# Patient Record
Sex: Male | Born: 1975 | Race: Black or African American | Hispanic: No | Marital: Single | Smoking: Former smoker
Health system: Southern US, Community
[De-identification: ages and names within clinical notes are randomized; demographics above are authoritative.]

## PROBLEM LIST (undated history)

## (undated) DIAGNOSIS — I10 Essential (primary) hypertension: Secondary | ICD-10-CM

## (undated) DIAGNOSIS — Z21 Asymptomatic human immunodeficiency virus [HIV] infection status: Secondary | ICD-10-CM

## (undated) DIAGNOSIS — F329 Major depressive disorder, single episode, unspecified: Secondary | ICD-10-CM

## (undated) DIAGNOSIS — M545 Low back pain, unspecified: Secondary | ICD-10-CM

## (undated) DIAGNOSIS — M1990 Unspecified osteoarthritis, unspecified site: Secondary | ICD-10-CM

## (undated) DIAGNOSIS — B20 Human immunodeficiency virus [HIV] disease: Secondary | ICD-10-CM

## (undated) DIAGNOSIS — A630 Anogenital (venereal) warts: Secondary | ICD-10-CM

## (undated) DIAGNOSIS — E663 Overweight: Secondary | ICD-10-CM

## (undated) DIAGNOSIS — F32A Depression, unspecified: Secondary | ICD-10-CM

## (undated) DIAGNOSIS — F419 Anxiety disorder, unspecified: Secondary | ICD-10-CM

## (undated) DIAGNOSIS — M797 Fibromyalgia: Secondary | ICD-10-CM

## (undated) DIAGNOSIS — G8929 Other chronic pain: Secondary | ICD-10-CM

## (undated) DIAGNOSIS — M722 Plantar fascial fibromatosis: Secondary | ICD-10-CM

## (undated) DIAGNOSIS — N289 Disorder of kidney and ureter, unspecified: Secondary | ICD-10-CM

## (undated) DIAGNOSIS — B181 Chronic viral hepatitis B without delta-agent: Secondary | ICD-10-CM

## (undated) DIAGNOSIS — K589 Irritable bowel syndrome without diarrhea: Secondary | ICD-10-CM

## (undated) DIAGNOSIS — E119 Type 2 diabetes mellitus without complications: Secondary | ICD-10-CM

## (undated) DIAGNOSIS — G43909 Migraine, unspecified, not intractable, without status migrainosus: Secondary | ICD-10-CM

## (undated) HISTORY — DX: Essential (primary) hypertension: I10

## (undated) HISTORY — DX: Overweight: E66.3

## (undated) HISTORY — DX: Anxiety disorder, unspecified: F41.9

## (undated) HISTORY — DX: Plantar fascial fibromatosis: M72.2

## (undated) HISTORY — DX: Type 2 diabetes mellitus without complications: E11.9

## (undated) HISTORY — DX: Human immunodeficiency virus (HIV) disease: B20

## (undated) HISTORY — DX: Depression, unspecified: F32.A

## (undated) HISTORY — DX: Asymptomatic human immunodeficiency virus (hiv) infection status: Z21

## (undated) HISTORY — DX: Major depressive disorder, single episode, unspecified: F32.9

## (undated) HISTORY — PX: WISDOM TOOTH EXTRACTION: SHX21

## (undated) HISTORY — PX: FOOT SURGERY: SHX648

## (undated) HISTORY — DX: Unspecified osteoarthritis, unspecified site: M19.90

---

## 2001-02-06 ENCOUNTER — Emergency Department (HOSPITAL_COMMUNITY): Admission: EM | Admit: 2001-02-06 | Discharge: 2001-02-07 | Payer: Self-pay | Admitting: Emergency Medicine

## 2001-05-07 ENCOUNTER — Emergency Department (HOSPITAL_COMMUNITY): Admission: EM | Admit: 2001-05-07 | Discharge: 2001-05-07 | Payer: Self-pay | Admitting: *Deleted

## 2001-05-07 ENCOUNTER — Encounter: Payer: Self-pay | Admitting: *Deleted

## 2001-06-12 ENCOUNTER — Emergency Department (HOSPITAL_COMMUNITY): Admission: EM | Admit: 2001-06-12 | Discharge: 2001-06-12 | Payer: Self-pay | Admitting: Emergency Medicine

## 2001-08-09 ENCOUNTER — Emergency Department (HOSPITAL_COMMUNITY): Admission: EM | Admit: 2001-08-09 | Discharge: 2001-08-09 | Payer: Self-pay | Admitting: Emergency Medicine

## 2001-09-05 ENCOUNTER — Emergency Department (HOSPITAL_COMMUNITY): Admission: EM | Admit: 2001-09-05 | Discharge: 2001-09-05 | Payer: Self-pay | Admitting: Emergency Medicine

## 2001-10-31 ENCOUNTER — Emergency Department (HOSPITAL_COMMUNITY): Admission: EM | Admit: 2001-10-31 | Discharge: 2001-10-31 | Payer: Self-pay | Admitting: Emergency Medicine

## 2001-12-14 ENCOUNTER — Emergency Department (HOSPITAL_COMMUNITY): Admission: EM | Admit: 2001-12-14 | Discharge: 2001-12-14 | Payer: Self-pay | Admitting: Emergency Medicine

## 2001-12-14 ENCOUNTER — Encounter: Payer: Self-pay | Admitting: Emergency Medicine

## 2002-03-13 ENCOUNTER — Emergency Department (HOSPITAL_COMMUNITY): Admission: EM | Admit: 2002-03-13 | Discharge: 2002-03-13 | Payer: Self-pay | Admitting: Podiatry

## 2002-03-27 ENCOUNTER — Emergency Department (HOSPITAL_COMMUNITY): Admission: EM | Admit: 2002-03-27 | Discharge: 2002-03-28 | Payer: Self-pay | Admitting: Emergency Medicine

## 2002-04-08 ENCOUNTER — Emergency Department (HOSPITAL_COMMUNITY): Admission: EM | Admit: 2002-04-08 | Discharge: 2002-04-08 | Payer: Self-pay | Admitting: Emergency Medicine

## 2002-04-29 ENCOUNTER — Emergency Department (HOSPITAL_COMMUNITY): Admission: EM | Admit: 2002-04-29 | Discharge: 2002-04-29 | Payer: Self-pay | Admitting: *Deleted

## 2002-05-22 ENCOUNTER — Emergency Department (HOSPITAL_COMMUNITY): Admission: EM | Admit: 2002-05-22 | Discharge: 2002-05-23 | Payer: Self-pay | Admitting: Emergency Medicine

## 2005-05-09 ENCOUNTER — Emergency Department (HOSPITAL_COMMUNITY): Admission: EM | Admit: 2005-05-09 | Discharge: 2005-05-09 | Payer: Self-pay | Admitting: Emergency Medicine

## 2005-08-23 ENCOUNTER — Emergency Department (HOSPITAL_COMMUNITY): Admission: EM | Admit: 2005-08-23 | Discharge: 2005-08-23 | Payer: Self-pay | Admitting: Emergency Medicine

## 2005-12-25 ENCOUNTER — Emergency Department (HOSPITAL_COMMUNITY): Admission: EM | Admit: 2005-12-25 | Discharge: 2005-12-25 | Payer: Self-pay | Admitting: Emergency Medicine

## 2009-02-19 ENCOUNTER — Emergency Department (HOSPITAL_COMMUNITY): Admission: EM | Admit: 2009-02-19 | Discharge: 2009-02-19 | Payer: Self-pay | Admitting: Family Medicine

## 2009-04-16 ENCOUNTER — Emergency Department (HOSPITAL_COMMUNITY): Admission: EM | Admit: 2009-04-16 | Discharge: 2009-04-16 | Payer: Self-pay | Admitting: Emergency Medicine

## 2009-04-20 ENCOUNTER — Emergency Department (HOSPITAL_COMMUNITY): Admission: EM | Admit: 2009-04-20 | Discharge: 2009-04-20 | Payer: Self-pay | Admitting: Emergency Medicine

## 2009-10-14 ENCOUNTER — Emergency Department (HOSPITAL_COMMUNITY): Admission: EM | Admit: 2009-10-14 | Discharge: 2009-10-14 | Payer: Self-pay | Admitting: Emergency Medicine

## 2009-10-25 ENCOUNTER — Emergency Department (HOSPITAL_COMMUNITY): Admission: EM | Admit: 2009-10-25 | Discharge: 2009-10-25 | Payer: Self-pay | Admitting: Emergency Medicine

## 2009-12-08 ENCOUNTER — Emergency Department (HOSPITAL_COMMUNITY): Admission: EM | Admit: 2009-12-08 | Discharge: 2009-12-08 | Payer: Self-pay | Admitting: Emergency Medicine

## 2010-04-11 ENCOUNTER — Emergency Department (HOSPITAL_COMMUNITY): Admission: EM | Admit: 2010-04-11 | Discharge: 2010-04-11 | Payer: Self-pay | Admitting: Emergency Medicine

## 2010-05-13 ENCOUNTER — Emergency Department (HOSPITAL_COMMUNITY): Admission: EM | Admit: 2010-05-13 | Discharge: 2010-05-13 | Payer: Self-pay | Admitting: Emergency Medicine

## 2010-05-31 ENCOUNTER — Emergency Department (HOSPITAL_COMMUNITY): Admission: EM | Admit: 2010-05-31 | Discharge: 2010-05-31 | Payer: Self-pay | Admitting: Emergency Medicine

## 2010-06-07 ENCOUNTER — Emergency Department (HOSPITAL_COMMUNITY)
Admission: EM | Admit: 2010-06-07 | Discharge: 2010-06-07 | Payer: Self-pay | Source: Home / Self Care | Admitting: Emergency Medicine

## 2010-09-12 LAB — POCT I-STAT, CHEM 8
Calcium, Ion: 1.1 mmol/L — ABNORMAL LOW (ref 1.12–1.32)
HCT: 42 % (ref 39.0–52.0)
Hemoglobin: 14.3 g/dL (ref 13.0–17.0)
Sodium: 140 mEq/L (ref 135–145)
TCO2: 25 mmol/L (ref 0–100)

## 2010-09-12 LAB — RAPID STREP SCREEN (MED CTR MEBANE ONLY): Streptococcus, Group A Screen (Direct): NEGATIVE

## 2010-09-19 LAB — CBC
HCT: 34 % — ABNORMAL LOW (ref 39.0–52.0)
Platelets: 212 10*3/uL (ref 150–400)
RDW: 15.1 % (ref 11.5–15.5)
WBC: 7.8 10*3/uL (ref 4.0–10.5)

## 2010-09-19 LAB — DIFFERENTIAL
Basophils Absolute: 0 10*3/uL (ref 0.0–0.1)
Eosinophils Relative: 4 % (ref 0–5)
Lymphocytes Relative: 40 % (ref 12–46)
Neutrophils Relative %: 45 % (ref 43–77)

## 2010-09-19 LAB — MONONUCLEOSIS SCREEN: Mono Screen: NEGATIVE

## 2010-09-19 LAB — RAPID STREP SCREEN (MED CTR MEBANE ONLY): Streptococcus, Group A Screen (Direct): NEGATIVE

## 2010-10-05 LAB — CBC
HCT: 34.7 % — ABNORMAL LOW (ref 39.0–52.0)
Platelets: 188 10*3/uL (ref 150–400)
RBC: 4.13 MIL/uL — ABNORMAL LOW (ref 4.22–5.81)
WBC: 10.7 10*3/uL — ABNORMAL HIGH (ref 4.0–10.5)

## 2010-10-05 LAB — BASIC METABOLIC PANEL
BUN: 7 mg/dL (ref 6–23)
Creatinine, Ser: 1.11 mg/dL (ref 0.4–1.5)
GFR calc Af Amer: >60 mL/min (ref >60)
GFR calc non Af Amer: >60 mL/min (ref >60)
Potassium: 3.2 mEq/L — ABNORMAL LOW (ref 3.5–5.1)

## 2010-10-05 LAB — DIFFERENTIAL
Basophils Relative: 0 % (ref 0–1)
Eosinophils Absolute: 0 10*3/uL (ref 0.0–0.7)
Neutrophils Relative %: 73 % (ref 43–77)

## 2010-10-05 LAB — CK TOTAL AND CKMB (NOT AT ARMC)
Relative Index: 0.5 (ref 0.0–2.5)
Total CK: 280 U/L — ABNORMAL HIGH (ref 7–232)

## 2010-10-05 LAB — TROPONIN I: Troponin I: 0.01 ng/mL (ref 0.00–0.06)

## 2010-10-05 LAB — RAPID STREP SCREEN (MED CTR MEBANE ONLY): Streptococcus, Group A Screen (Direct): NEGATIVE

## 2010-10-07 LAB — DIFFERENTIAL
Basophils Absolute: 0 10*3/uL (ref 0.0–0.1)
Basophils Relative: 0 % (ref 0–1)
Eosinophils Absolute: 0.1 K/uL (ref 0.0–0.7)
Eosinophils Relative: 1 % (ref 0–5)
Lymphocytes Relative: 30 % (ref 12–46)
Lymphs Abs: 2.8 K/uL (ref 0.7–4.0)
Monocytes Absolute: 1 K/uL (ref 0.1–1.0)
Monocytes Relative: 11 % (ref 3–12)
Neutro Abs: 5.4 10*3/uL (ref 1.7–7.7)
Neutrophils Relative %: 58 % (ref 43–77)

## 2010-10-07 LAB — CBC
HCT: 35.3 % — ABNORMAL LOW (ref 39.0–52.0)
Hemoglobin: 12.1 g/dL — ABNORMAL LOW (ref 13.0–17.0)
MCHC: 34.3 g/dL (ref 30.0–36.0)
MCV: 84.1 fL (ref 78.0–100.0)
Platelets: 168 K/uL (ref 150–400)
RBC: 4.2 MIL/uL — ABNORMAL LOW (ref 4.22–5.81)
RDW: 13.8 % (ref 11.5–15.5)
WBC: 9.2 K/uL (ref 4.0–10.5)

## 2010-10-07 LAB — COMPREHENSIVE METABOLIC PANEL WITH GFR
ALT: 13 U/L (ref 0–53)
AST: 30 U/L (ref 0–37)
Albumin: 3.2 g/dL — ABNORMAL LOW (ref 3.5–5.2)
CO2: 30 meq/L (ref 19–32)
Calcium: 9.2 mg/dL (ref 8.4–10.5)
Chloride: 102 meq/L (ref 96–112)
Creatinine, Ser: 1.11 mg/dL (ref 0.4–1.5)
GFR calc Af Amer: >60 mL/min (ref >60)
GFR calc non Af Amer: >60 mL/min (ref >60)
Sodium: 137 meq/L (ref 135–145)
Total Bilirubin: 0.4 mg/dL (ref 0.3–1.2)

## 2010-10-07 LAB — URINALYSIS, ROUTINE W REFLEX MICROSCOPIC
Bilirubin Urine: NEGATIVE
Glucose, UA: NEGATIVE mg/dL
Ketones, ur: NEGATIVE mg/dL
Leukocytes, UA: NEGATIVE
Nitrite: NEGATIVE
Protein, ur: NEGATIVE mg/dL
Specific Gravity, Urine: 1.007 (ref 1.005–1.030)
Urobilinogen, UA: 0.2 mg/dL (ref 0.0–1.0)
pH: 6 (ref 5.0–8.0)

## 2010-10-07 LAB — LIPASE, BLOOD: Lipase: 31 U/L (ref 11–59)

## 2010-10-07 LAB — URINE MICROSCOPIC-ADD ON

## 2010-10-07 LAB — COMPREHENSIVE METABOLIC PANEL
Alkaline Phosphatase: 57 U/L (ref 39–117)
BUN: 8 mg/dL (ref 6–23)
Glucose, Bld: 88 mg/dL (ref 70–99)
Potassium: 3.9 mEq/L (ref 3.5–5.1)
Total Protein: 9.4 g/dL — ABNORMAL HIGH (ref 6.0–8.3)

## 2010-11-07 ENCOUNTER — Emergency Department (HOSPITAL_COMMUNITY)
Admission: EM | Admit: 2010-11-07 | Discharge: 2010-11-08 | Disposition: A | Discharge status: Home / Self Care | Payer: Self-pay | Attending: Emergency Medicine | Admitting: Emergency Medicine

## 2010-11-07 DIAGNOSIS — F410 Panic disorder [episodic paroxysmal anxiety] without agoraphobia: Secondary | ICD-10-CM | POA: Insufficient documentation

## 2010-11-07 DIAGNOSIS — R51 Headache: Secondary | ICD-10-CM | POA: Insufficient documentation

## 2010-11-10 ENCOUNTER — Emergency Department (HOSPITAL_COMMUNITY)
Admission: EM | Admit: 2010-11-10 | Discharge: 2010-11-10 | Disposition: A | Discharge status: Home / Self Care | Payer: Self-pay | Attending: Emergency Medicine | Admitting: Emergency Medicine

## 2010-11-10 DIAGNOSIS — M25559 Pain in unspecified hip: Secondary | ICD-10-CM | POA: Insufficient documentation

## 2010-12-02 ENCOUNTER — Emergency Department (HOSPITAL_COMMUNITY)
Admission: EM | Admit: 2010-12-02 | Discharge: 2010-12-02 | Disposition: A | Discharge status: Home / Self Care | Payer: Self-pay | Attending: Emergency Medicine | Admitting: Emergency Medicine

## 2010-12-02 ENCOUNTER — Emergency Department (HOSPITAL_COMMUNITY): Payer: Self-pay

## 2010-12-02 DIAGNOSIS — M545 Low back pain, unspecified: Secondary | ICD-10-CM | POA: Insufficient documentation

## 2010-12-02 DIAGNOSIS — IMO0002 Reserved for concepts with insufficient information to code with codable children: Secondary | ICD-10-CM | POA: Insufficient documentation

## 2010-12-02 DIAGNOSIS — M25559 Pain in unspecified hip: Secondary | ICD-10-CM | POA: Insufficient documentation

## 2010-12-02 DIAGNOSIS — X58XXXA Exposure to other specified factors, initial encounter: Secondary | ICD-10-CM | POA: Insufficient documentation

## 2011-01-30 ENCOUNTER — Ambulatory Visit (INDEPENDENT_AMBULATORY_CARE_PROVIDER_SITE_OTHER): Payer: Self-pay

## 2011-01-30 DIAGNOSIS — B20 Human immunodeficiency virus [HIV] disease: Secondary | ICD-10-CM

## 2011-01-30 DIAGNOSIS — Z79899 Other long term (current) drug therapy: Secondary | ICD-10-CM

## 2011-01-30 DIAGNOSIS — Z113 Encounter for screening for infections with a predominantly sexual mode of transmission: Secondary | ICD-10-CM

## 2011-01-30 DIAGNOSIS — Z23 Encounter for immunization: Secondary | ICD-10-CM

## 2011-01-30 LAB — CBC WITH DIFFERENTIAL/PLATELET
Basophils Absolute: 0 10*3/uL (ref 0.0–0.1)
Basophils Relative: 1 % (ref 0–1)
Eosinophils Absolute: 0 10*3/uL (ref 0.0–0.7)
Eosinophils Relative: 1 % (ref 0–5)
HCT: 33.5 % — ABNORMAL LOW (ref 39.0–52.0)
Hemoglobin: 10.9 g/dL — ABNORMAL LOW (ref 13.0–17.0)
Lymphocytes Relative: 46 % (ref 12–46)
Lymphs Abs: 3 10*3/uL (ref 0.7–4.0)
MCH: 27 pg (ref 26.0–34.0)
MCHC: 32.5 g/dL (ref 30.0–36.0)
MCV: 82.9 fL (ref 78.0–100.0)
Monocytes Absolute: 1 10*3/uL (ref 0.1–1.0)
Monocytes Relative: 15 % — ABNORMAL HIGH (ref 3–12)
Neutro Abs: 2.5 10*3/uL (ref 1.7–7.7)
Neutrophils Relative %: 38 % — ABNORMAL LOW (ref 43–77)
Platelets: 181 10*3/uL (ref 150–400)
RBC: 4.04 MIL/uL — ABNORMAL LOW (ref 4.22–5.81)
RDW: 13.6 % (ref 11.5–15.5)
WBC: 6.6 10*3/uL (ref 4.0–10.5)

## 2011-01-31 LAB — HEPATITIS C ANTIBODY: HCV Ab: NEGATIVE

## 2011-01-31 LAB — HEPATITIS B SURFACE ANTIGEN: Hepatitis B Surface Ag: POSITIVE — AB

## 2011-01-31 LAB — LIPID PANEL
HDL: 34 mg/dL — ABNORMAL LOW (ref >39)
LDL Cholesterol: 72 mg/dL (ref 0–99)
Total CHOL/HDL Ratio: 3.5 Ratio
Triglycerides: 65 mg/dL (ref <150)

## 2011-01-31 LAB — HIV-1 RNA ULTRAQUANT REFLEX TO GENTYP+
HIV 1 RNA Quant: 99000 copies/mL — ABNORMAL HIGH (ref <20)
HIV-1 RNA Quant, Log: 5 {Log} — ABNORMAL HIGH (ref <1.30)

## 2011-01-31 LAB — COMPLETE METABOLIC PANEL WITH GFR
BUN: 18 mg/dL (ref 6–23)
CO2: 24 mEq/L (ref 19–32)
Creat: 1.27 mg/dL (ref 0.50–1.35)
GFR, Est African American: >60 mL/min (ref >60)
GFR, Est Non African American: >60 mL/min (ref >60)
Glucose, Bld: 82 mg/dL (ref 70–99)
Total Bilirubin: 0.4 mg/dL (ref 0.3–1.2)

## 2011-01-31 LAB — RPR

## 2011-01-31 LAB — HEPATITIS B CORE ANTIBODY, IGM: Hep B C IgM: NEGATIVE

## 2011-01-31 LAB — URINALYSIS
Glucose, UA: NEGATIVE mg/dL
Hgb urine dipstick: NEGATIVE
Ketones, ur: NEGATIVE mg/dL
Leukocytes, UA: NEGATIVE
Nitrite: NEGATIVE
Protein, ur: 30 mg/dL — AB

## 2011-02-01 ENCOUNTER — Encounter (INDEPENDENT_AMBULATORY_CARE_PROVIDER_SITE_OTHER): Payer: PRIVATE HEALTH INSURANCE

## 2011-02-01 DIAGNOSIS — B20 Human immunodeficiency virus [HIV] disease: Secondary | ICD-10-CM | POA: Diagnosis not present

## 2011-02-02 DIAGNOSIS — J302 Other seasonal allergic rhinitis: Secondary | ICD-10-CM | POA: Insufficient documentation

## 2011-02-02 DIAGNOSIS — F419 Anxiety disorder, unspecified: Secondary | ICD-10-CM | POA: Insufficient documentation

## 2011-02-07 LAB — HIV-1 GENOTYPR PLUS

## 2011-02-15 ENCOUNTER — Telehealth: Payer: Self-pay | Admitting: *Deleted

## 2011-02-15 ENCOUNTER — Ambulatory Visit: Payer: Self-pay

## 2011-02-15 ENCOUNTER — Ambulatory Visit (INDEPENDENT_AMBULATORY_CARE_PROVIDER_SITE_OTHER): Payer: Self-pay | Admitting: Internal Medicine

## 2011-02-15 ENCOUNTER — Encounter: Payer: Self-pay | Admitting: Internal Medicine

## 2011-02-15 VITALS — BP 155/109 | HR 85 | Temp 97.5°F | Ht 72.0 in | Wt 162.0 lb

## 2011-02-15 DIAGNOSIS — B20 Human immunodeficiency virus [HIV] disease: Secondary | ICD-10-CM

## 2011-02-15 DIAGNOSIS — B1910 Unspecified viral hepatitis B without hepatic coma: Secondary | ICD-10-CM

## 2011-02-15 DIAGNOSIS — I10 Essential (primary) hypertension: Secondary | ICD-10-CM

## 2011-02-15 DIAGNOSIS — Z23 Encounter for immunization: Secondary | ICD-10-CM

## 2011-02-15 MED ORDER — CLONIDINE HCL 0.1 MG PO TABS
0.1000 mg | ORAL_TABLET | Freq: Once | ORAL | Status: AC
  Administered 2011-02-15: 0.1 mg via ORAL

## 2011-02-15 NOTE — Telephone Encounter (Signed)
Call patient and let him know that Dr. Luciana Axe decided on a different regimen than Atripla.  Call after ADAP goes through. Wendall Mola CMA

## 2011-02-15 NOTE — Assessment & Plan Note (Signed)
His bp was elevated and he reports it has been elevated in the past.  He did respond to clonidine.  I discussed smoking cessation and he will try to stop all together.  He did not want a patch.  If he continues to smoke, he likely will need antihypertensive therapy.   Will recheck next visit.

## 2011-02-15 NOTE — Assessment & Plan Note (Signed)
I will check his DNA and hep B e ab, ag today.  He will start on therapy with Truvada once on adap.  He does have elevated AST likely as a result of the hep b.  He will also need AFP next visit.

## 2011-02-15 NOTE — Assessment & Plan Note (Signed)
After discussion with him regarding treatment, he does appear ready to start a daily regimen.  I am hesitant though to give him Atripla with a history of anxiety and his work schedule and his living situation, since he was recently in a shelter.  Therefore, I am going to opt for something with a higher threshold of reistance that also will treat his hepatitis b.  I will start him on boosted Prezista and Truvada all once daily.  I discussed with him the need to use condoms with all sexual activity.  I will repeat his vl and CD4 one month after he starts his meds, once he gets them through ADAP.

## 2011-02-15 NOTE — Progress Notes (Signed)
  Subjective:    Patient ID: Phillip Gill, male    DOB: 1975-08-20, 35 y.o.   MRN: [id number]  HPI here as a new patient.  Diagnosed in 2005 with risk being MSM.  He knew of the diagnsosis at that time and saw a health care provider who it appears did not start him on meds due to his CD4 count being good.  He reports he has never been on meds in the past.  He denies any other STIs.  He recently moved here and lives with an ex partner. He denies any depression or history of mental illness, though he has anxiety listed on his problems.  He works third shift.  No recent hospitalizations.  He does report some fatigue.  He did not know about his hepatitis B status.      Review of Systems  All other systems reviewed and are negative.       Objective:   Physical Exam  Constitutional: He is oriented to person, place, and time. He appears well-developed and well-nourished.  Cardiovascular: Normal rate, regular rhythm and normal heart sounds.   No murmur heard. Pulmonary/Chest: Effort normal and breath sounds normal. No respiratory distress.  Abdominal: Soft. Bowel sounds are normal. He exhibits no distension.  Lymphadenopathy:    He has no cervical adenopathy.  Neurological: He is alert and oriented to person, place, and time.  Skin: Skin is warm and dry. No erythema.  Psychiatric:       Moderately flat affect          Assessment & Plan:

## 2011-02-16 ENCOUNTER — Other Ambulatory Visit: Payer: Self-pay | Admitting: *Deleted

## 2011-02-16 DIAGNOSIS — B20 Human immunodeficiency virus [HIV] disease: Secondary | ICD-10-CM

## 2011-02-16 MED ORDER — RITONAVIR 100 MG PO TABS: 100.0000 mg | ORAL_TABLET | Freq: Every day | ORAL | Status: DC

## 2011-02-16 MED ORDER — EMTRICITABINE-TENOFOVIR DF 200-300 MG PO TABS: 1.0000 | ORAL_TABLET | Freq: Every day | ORAL | Status: DC

## 2011-02-16 MED ORDER — DARUNAVIR ETHANOLATE 400 MG PO TABS: 800.0000 mg | ORAL_TABLET | Freq: Every day | ORAL | Status: DC

## 2011-02-22 LAB — HEPATITIS B DNA, QUALITATIVE: Hepatitis B Virus DNA Qual: DETECTED — AB

## 2011-03-02 ENCOUNTER — Telehealth: Payer: Self-pay | Admitting: *Deleted

## 2011-03-02 NOTE — Telephone Encounter (Signed)
Phillip Gill from [facility] Department requesting LFT lab results faxed.  Faxed labs to [phone]. Wendall Mola CMA

## 2011-03-22 ENCOUNTER — Encounter: Payer: Self-pay | Admitting: Internal Medicine

## 2011-03-22 ENCOUNTER — Ambulatory Visit (INDEPENDENT_AMBULATORY_CARE_PROVIDER_SITE_OTHER): Payer: Self-pay | Admitting: Internal Medicine

## 2011-03-22 VITALS — BP 128/86 | HR 67 | Temp 98.6°F | Wt 171.4 lb

## 2011-03-22 DIAGNOSIS — B20 Human immunodeficiency virus [HIV] disease: Secondary | ICD-10-CM

## 2011-03-22 DIAGNOSIS — B1910 Unspecified viral hepatitis B without hepatic coma: Secondary | ICD-10-CM

## 2011-03-22 DIAGNOSIS — I10 Essential (primary) hypertension: Secondary | ICD-10-CM

## 2011-03-22 NOTE — Assessment & Plan Note (Signed)
Today his hepatitis labs were reviewed. He does have detectable DNA virus and is hepatitis Be antigen positive and is on therapy with Truvada. He will have an AFP checked and his surface antigen checked after one year. I discussed the need to avoid alcohol and other liver consulting agents.

## 2011-03-22 NOTE — Progress Notes (Signed)
  Subjective:    Patient ID: Phillip Gill, male    DOB: Oct 20, 1975, 35 y.o.   MRN: [id number]  HPI this patient comes in for followup after starting his new antiretroviral regimen. He was started on boost and Prezista and Truvada approximately 3 weeks ago and he reports excellent compliance taking all of his doses. His only complaint today is a small papillary rash on his upper arms and chest. He tells me that nonpruritic and otherwise does not bother him. It is not erythematous. He otherwise reports some constipation but no complaints of diarrhea, shortness of breath, weight loss. He also has a history of hepatitis B surface antigen positive and BE antigen positive. His hepatitis be a virus it is detectable but was not quantified.    Review of Systems  Constitutional: Negative.   HENT: Negative.   Eyes: Negative.   Respiratory: Negative.   Cardiovascular: Negative.   Gastrointestinal: Negative.   Genitourinary: Negative.   Musculoskeletal: Negative.   Skin: Negative.        + nonpuritic rash  Neurological: Negative.   Hematological: Negative.   Psychiatric/Behavioral: Negative.        Objective:   Physical Exam  Constitutional: He is oriented to person, place, and time. He appears well-developed and well-nourished.  Cardiovascular: Normal rate, regular rhythm and normal heart sounds.   No murmur heard. Pulmonary/Chest: Effort normal and breath sounds normal. He has no wheezes.  Abdominal: Soft. Bowel sounds are normal. There is no tenderness.  Lymphadenopathy:    He has no cervical adenopathy.  Neurological: He is alert and oriented to person, place, and time.  Skin:       + multiple pappilary lesions on upper arms and torso, non-erythematous, no pruritis, no hives.  Psychiatric: He has a normal mood and affect. His behavior is normal.          Assessment & Plan:

## 2011-03-22 NOTE — Assessment & Plan Note (Signed)
Today his blood pressure is in better control and this will be watched.

## 2011-03-22 NOTE — Assessment & Plan Note (Signed)
Today he will have his CD4 T-cell count and viral load checked since he has been on his therapy for approximately 3 weeks.if his viral load is improving he will return in approximately 6 weeks for repeat labs and a appointment in 2 months. I do not feel his rash is consistent with a drug rash since he does not have the typical characteristics of a drug rash. I did tell him though if it worsens or changes to return to clinic. I encouraged continued compliance and the need for condom use. I discussed the long-term effects of HIV and antiretroviral therapy.

## 2011-03-26 ENCOUNTER — Other Ambulatory Visit: Payer: Self-pay | Admitting: *Deleted

## 2011-03-26 DIAGNOSIS — B20 Human immunodeficiency virus [HIV] disease: Secondary | ICD-10-CM

## 2011-03-26 LAB — HIV-1 RNA QUANT-NO REFLEX-BLD
HIV 1 RNA Quant: 1140 copies/mL — ABNORMAL HIGH (ref <20)
HIV-1 RNA Quant, Log: 3.06 {Log} — ABNORMAL HIGH (ref <1.30)

## 2011-03-26 MED ORDER — EMTRICITABINE-TENOFOVIR DF 200-300 MG PO TABS: 1.0000 | ORAL_TABLET | Freq: Every day | ORAL | Status: DC

## 2011-03-26 MED ORDER — RITONAVIR 100 MG PO TABS: 100.0000 mg | ORAL_TABLET | Freq: Every day | ORAL | Status: DC

## 2011-03-26 MED ORDER — DARUNAVIR ETHANOLATE 400 MG PO TABS
800.0000 mg | ORAL_TABLET | Freq: Every day | ORAL | Status: DC
Start: 2011-03-26 — End: 2011-07-19

## 2011-03-26 NOTE — Telephone Encounter (Deleted)
Duplicate. Jennet Maduro, RN

## 2011-03-27 ENCOUNTER — Telehealth: Payer: Self-pay | Admitting: *Deleted

## 2011-03-27 NOTE — Telephone Encounter (Signed)
He wanted to know lab results which were drawn at [facility]. Gave him the numbers & discussed. States he is taking his meds each day & has not missed any

## 2011-04-26 ENCOUNTER — Ambulatory Visit: Payer: Self-pay

## 2011-05-08 ENCOUNTER — Other Ambulatory Visit: Payer: Self-pay | Admitting: Internal Medicine

## 2011-05-08 ENCOUNTER — Other Ambulatory Visit (INDEPENDENT_AMBULATORY_CARE_PROVIDER_SITE_OTHER): Payer: Self-pay

## 2011-05-08 DIAGNOSIS — B20 Human immunodeficiency virus [HIV] disease: Secondary | ICD-10-CM

## 2011-05-08 DIAGNOSIS — B1910 Unspecified viral hepatitis B without hepatic coma: Secondary | ICD-10-CM

## 2011-05-09 LAB — CBC WITH DIFFERENTIAL/PLATELET
Basophils Absolute: 0 10*3/uL (ref 0.0–0.1)
Basophils Relative: 0 % (ref 0–1)
Eosinophils Absolute: 0.1 10*3/uL (ref 0.0–0.7)
Eosinophils Relative: 1 % (ref 0–5)
MCH: 28.7 pg (ref 26.0–34.0)
MCHC: 32.2 g/dL (ref 30.0–36.0)
MCV: 89.3 fL (ref 78.0–100.0)
Neutrophils Relative %: 34 % — ABNORMAL LOW (ref 43–77)
Platelets: 178 10*3/uL (ref 150–400)
RBC: 4.28 MIL/uL (ref 4.22–5.81)
RDW: 15.8 % — ABNORMAL HIGH (ref 11.5–15.5)

## 2011-05-09 LAB — COMPLETE METABOLIC PANEL WITH GFR
ALT: 13 U/L (ref 0–53)
AST: 28 U/L (ref 0–37)
Albumin: 3.8 g/dL (ref 3.5–5.2)
CO2: 30 mEq/L (ref 19–32)
Calcium: 9 mg/dL (ref 8.4–10.5)
Chloride: 104 mEq/L (ref 96–112)
Creat: 1.14 mg/dL (ref 0.50–1.35)
GFR, Est African American: >89 mL/min (ref >89)
Potassium: 4.4 mEq/L (ref 3.5–5.3)
Total Protein: 8.8 g/dL — ABNORMAL HIGH (ref 6.0–8.3)

## 2011-05-09 LAB — AFP TUMOR MARKER: AFP-Tumor Marker: <1.3 ng/mL (ref 0.0–8.0)

## 2011-05-10 LAB — HIV-1 RNA QUANT-NO REFLEX-BLD: HIV-1 RNA Quant, Log: 2.21 {Log} — ABNORMAL HIGH (ref <1.30)

## 2011-05-22 ENCOUNTER — Ambulatory Visit: Payer: Self-pay | Admitting: Internal Medicine

## 2011-05-22 ENCOUNTER — Ambulatory Visit (INDEPENDENT_AMBULATORY_CARE_PROVIDER_SITE_OTHER): Payer: Self-pay | Admitting: Internal Medicine

## 2011-05-22 ENCOUNTER — Encounter: Payer: Self-pay | Admitting: Internal Medicine

## 2011-05-22 VITALS — BP 150/98 | HR 84 | Temp 98.0°F | Wt 176.0 lb

## 2011-05-22 DIAGNOSIS — M5430 Sciatica, unspecified side: Secondary | ICD-10-CM

## 2011-05-22 DIAGNOSIS — B20 Human immunodeficiency virus [HIV] disease: Secondary | ICD-10-CM

## 2011-05-22 DIAGNOSIS — F411 Generalized anxiety disorder: Secondary | ICD-10-CM

## 2011-05-22 DIAGNOSIS — F419 Anxiety disorder, unspecified: Secondary | ICD-10-CM

## 2011-05-22 DIAGNOSIS — Z23 Encounter for immunization: Secondary | ICD-10-CM

## 2011-05-22 DIAGNOSIS — I10 Essential (primary) hypertension: Secondary | ICD-10-CM

## 2011-05-22 DIAGNOSIS — B1910 Unspecified viral hepatitis B without hepatic coma: Secondary | ICD-10-CM

## 2011-05-22 LAB — URINALYSIS
Ketones, ur: NEGATIVE mg/dL
Nitrite: NEGATIVE
Specific Gravity, Urine: 1.014 (ref 1.005–1.030)
Urobilinogen, UA: 0.2 mg/dL (ref 0.0–1.0)

## 2011-05-22 NOTE — Assessment & Plan Note (Signed)
The patient continues to have good tolerance of the medications in his CD4 count is rising appropriately and his viral load is near suppression. He continues to have good tolerance to medications and will continue. He will followup in 4 months. He was reminded that he needs to use condoms with all sexual activity.

## 2011-05-22 NOTE — Patient Instructions (Signed)
Follow up with new PCP.

## 2011-05-22 NOTE — Assessment & Plan Note (Signed)
His blood pressure again today is elevated. I did discuss potential need for antihypertensive therapy. Per his request she will be referred to the primary care physician to evaluate his non-HIV issues.

## 2011-05-22 NOTE — Assessment & Plan Note (Signed)
He did describe recent panic attack and he was given information for mental health provider to followup with.

## 2011-05-22 NOTE — Assessment & Plan Note (Signed)
He does have symptoms that appear consistent with sciatica. At this time and will be followed clinically however it was he was offered to be referred to physical therapy if the sciatica does worsen or persist without any relief.

## 2011-05-22 NOTE — Assessment & Plan Note (Signed)
The patient is also co-infected with hepatitis B. He does take the Truvada as part of his regimen which will treat his hepatitis B. He will have regular alpha-fetoprotein levels and we'll check his hepatitis B viral load in approximately a years time.

## 2011-05-22 NOTE — Progress Notes (Signed)
  Subjective:    Patient ID: Phillip Gill, male    DOB: 05-18-1976, 35 y.o.   MRN: [id number]  HPIthis patient comes in for routine followup. He was recently started on regimen of boosted Prezista and Truvada. He has taken it faithfully and has not missed any doses. He does have multiple complaints though with the regimen he takes it well and feels like it is working. He does have a complaint of what sounds like dysuria and frequency that's been going on for the last several weeks. He does get up at night though he states it has gotten better recently. He also has some fatigue which has been going on since he started his medications for. He also has a complaint of some lesions that he feels on his anal region. He also has some nerve pain on his left lumbar region that sometimes radiates down his left leg and period. He also does describe a recent panic attack that occurred which is the first one is had in many years according to him.    Review of Systems  Constitutional: Positive for fatigue. Negative for fever, activity change, appetite change and unexpected weight change.  HENT: Negative for trouble swallowing.   Respiratory: Negative for cough and shortness of breath.   Cardiovascular: Negative for leg swelling.  Gastrointestinal: Negative for nausea, diarrhea, constipation and rectal pain.  Genitourinary: Positive for dysuria and frequency. Negative for discharge and testicular pain.  Musculoskeletal: Negative for myalgias and arthralgias.  Skin: Negative for pallor and rash.  Neurological: Negative for headaches.  Hematological: Negative for adenopathy.  Psychiatric/Behavioral: The patient is not nervous/anxious.        Objective:   Physical Exam  Constitutional: He is oriented to person, place, and time. He appears well-developed and well-nourished. No distress.  HENT:  Mouth/Throat: Oropharynx is clear and moist. No oropharyngeal exudate.  Eyes: No scleral icterus.    Cardiovascular: Normal rate, regular rhythm and normal heart sounds.  Exam reveals no gallop and no friction rub.   No murmur heard. Pulmonary/Chest: Effort normal and breath sounds normal. No respiratory distress. He has no wheezes. He has no rales.  Abdominal: Soft. Bowel sounds are normal. He exhibits no distension. There is no tenderness. There is no rebound.  Genitourinary: Penis normal. No penile tenderness.       Testicular size normal, no pain  Lymphadenopathy:    He has no cervical adenopathy.  Neurological: He is alert and oriented to person, place, and time.  Skin: Skin is warm and dry. No erythema.  Psychiatric: He has a normal mood and affect.          Assessment & Plan:

## 2011-05-23 LAB — GC/CHLAMYDIA PROBE AMP, URINE
Chlamydia, Swab/Urine, PCR: NEGATIVE
GC Probe Amp, Urine: NEGATIVE

## 2011-07-13 ENCOUNTER — Emergency Department (HOSPITAL_COMMUNITY): Payer: Self-pay

## 2011-07-13 ENCOUNTER — Other Ambulatory Visit: Payer: Self-pay

## 2011-07-13 ENCOUNTER — Encounter (HOSPITAL_COMMUNITY): Payer: Self-pay | Admitting: *Deleted

## 2011-07-13 ENCOUNTER — Emergency Department (HOSPITAL_COMMUNITY)
Admission: EM | Admit: 2011-07-13 | Discharge: 2011-07-13 | Disposition: A | Discharge status: Home / Self Care | Payer: Self-pay | Attending: Emergency Medicine | Admitting: Emergency Medicine

## 2011-07-13 DIAGNOSIS — M545 Low back pain, unspecified: Secondary | ICD-10-CM | POA: Insufficient documentation

## 2011-07-13 DIAGNOSIS — R079 Chest pain, unspecified: Secondary | ICD-10-CM | POA: Insufficient documentation

## 2011-07-13 DIAGNOSIS — R209 Unspecified disturbances of skin sensation: Secondary | ICD-10-CM | POA: Insufficient documentation

## 2011-07-13 DIAGNOSIS — Z21 Asymptomatic human immunodeficiency virus [HIV] infection status: Secondary | ICD-10-CM | POA: Insufficient documentation

## 2011-07-13 DIAGNOSIS — R202 Paresthesia of skin: Secondary | ICD-10-CM

## 2011-07-13 DIAGNOSIS — M79609 Pain in unspecified limb: Secondary | ICD-10-CM | POA: Insufficient documentation

## 2011-07-13 DIAGNOSIS — Z79899 Other long term (current) drug therapy: Secondary | ICD-10-CM | POA: Insufficient documentation

## 2011-07-13 DIAGNOSIS — I10 Essential (primary) hypertension: Secondary | ICD-10-CM | POA: Insufficient documentation

## 2011-07-13 LAB — CBC
HCT: 38.7 % — ABNORMAL LOW (ref 39.0–52.0)
MCHC: 33.6 g/dL (ref 30.0–36.0)
MCV: 89 fL (ref 78.0–100.0)
RDW: 13.5 % (ref 11.5–15.5)

## 2011-07-13 LAB — BASIC METABOLIC PANEL
BUN: 17 mg/dL (ref 6–23)
Calcium: 9.2 mg/dL (ref 8.4–10.5)
Creatinine, Ser: 1.15 mg/dL (ref 0.50–1.35)
GFR calc Af Amer: >90 mL/min (ref >90)
GFR calc non Af Amer: 81 mL/min — ABNORMAL LOW (ref >90)

## 2011-07-13 LAB — URINALYSIS, ROUTINE W REFLEX MICROSCOPIC
Bilirubin Urine: NEGATIVE
Ketones, ur: NEGATIVE mg/dL
Nitrite: NEGATIVE
Urobilinogen, UA: 0.2 mg/dL (ref 0.0–1.0)
pH: 5 (ref 5.0–8.0)

## 2011-07-13 LAB — CARDIAC PANEL(CRET KIN+CKTOT+MB+TROPI): Troponin I: <0.3 ng/mL (ref <0.30)

## 2011-07-13 MED ORDER — HYDROCODONE-ACETAMINOPHEN 5-325 MG PO TABS: 1.0000 | ORAL_TABLET | ORAL | Status: AC | PRN

## 2011-07-13 MED ORDER — CYCLOBENZAPRINE HCL 10 MG PO TABS
5.0000 mg | ORAL_TABLET | Freq: Once | ORAL | Status: AC
  Administered 2011-07-13: 5 mg via ORAL
  Filled 2011-07-13: qty 1

## 2011-07-13 MED ORDER — CYCLOBENZAPRINE HCL 10 MG PO TABS: 10.0000 mg | ORAL_TABLET | Freq: Two times a day (BID) | ORAL | Status: DC | PRN

## 2011-07-13 MED ORDER — NAPROXEN 500 MG PO TABS: 500.0000 mg | ORAL_TABLET | Freq: Two times a day (BID) | ORAL | Status: DC

## 2011-07-13 MED ORDER — NAPROXEN 500 MG PO TABS
500.0000 mg | ORAL_TABLET | ORAL | Status: AC
  Administered 2011-07-13: 500 mg via ORAL
  Filled 2011-07-13: qty 1

## 2011-07-13 NOTE — ED Provider Notes (Addendum)
History     CSN: [id number]  Arrival date & time 07/13/11  1356   First MD Initiated Contact with Patient 07/13/11 1548      Chief Complaint  Patient presents with  . Tingling   patient presents to the ED for evaluation of tingling to the body for 3-4 days.  He states it initially began when he was moving a pallet jack 3 days ago, where he works at [employer]. The numbness has been moving from different parts of the body from the left leg to the left arm. He's had no headache or neck pain. No focal, motor weakness. No bowel or bladder incontinence. He has had some mild pain in his right low back and also some mild pain in his right chest which has been transient and nonexertional. No shortness of breath. No fevers. He does follow with Dr. Verdie Drown at the infectious disease clinic and states his viral load is very low. His HIV is under control.  (Consider location/radiation/quality/duration/timing/severity/associated sxs/prior treatment) HPI  Past Medical History  Diagnosis Date  . HIV infection   . Hypertension     History reviewed. No pertinent past surgical history.  Family History  Problem Relation Age of Onset  . Mental illness Neg Hx     History  Substance Use Topics  . Smoking status: Current Everyday Smoker -- 0.5 packs/day for 15 years    Types: Cigarettes  . Smokeless tobacco: Never Used  . Alcohol Use: Yes     every 2 weeks hard liquor      Review of Systems  All other systems reviewed and are negative.    Allergies  Bactrim and Zoloft  Home Medications   Current Outpatient Rx  Name Route Sig Dispense Refill  . DARUNAVIR ETHANOLATE 400 MG PO TABS Oral Take 2 tablets (800 mg total) by mouth daily. 60 tablet 11  . EMTRICITABINE-TENOFOVIR 200-300 MG PO TABS Oral Take 1 tablet by mouth daily. 30 tablet 11  . RITONAVIR 100 MG PO TABS Oral Take 1 tablet (100 mg total) by mouth daily. 30 tablet 11    BP 146/69  Pulse 79  Temp(Src) 98.2 F (36.8 C) (Oral)   Resp 16  SpO2 100%  Physical Exam  Nursing note and vitals reviewed. Constitutional: He is oriented to person, place, and time. He appears well-developed and well-nourished.  HENT:  Head: Normocephalic and atraumatic.  Eyes: Conjunctivae and EOM are normal. Pupils are equal, round, and reactive to light.  Neck: Neck supple.  Cardiovascular: Normal rate and regular rhythm.  Exam reveals no gallop and no friction rub.   No murmur heard. Pulmonary/Chest: Breath sounds normal. He has no wheezes. He has no rales. He exhibits no tenderness.  Abdominal: Soft. Bowel sounds are normal. He exhibits no distension. There is no tenderness. There is no rebound and no guarding.  Musculoskeletal: Normal range of motion. He exhibits no edema.       Minimal tenderness, right lower paralumbar musculature, no spinal tenderness. There is some tenderness with left lower leg extension and left leg raise. There is no calf tenderness or swelling.  Neurological: He is alert and oriented to person, place, and time. No cranial nerve deficit. Coordination normal.       Motor strength is equal and symmetric.  Skin: Skin is warm and dry. No rash noted.  Psychiatric: He has a normal mood and affect.    ED Course  Procedures (including critical care time)  Labs Reviewed - No data to  display No results found.   No diagnosis found.    MDM  Pt is seen and examined;  Initial history and physical completed.  Will follow.        Results for orders placed during the hospital encounter of 07/13/11  CBC      Component Value Range   WBC 9.4  4.0 - 10.5 (K/uL)   RBC 4.35  4.22 - 5.81 (MIL/uL)   Hemoglobin 13.0  13.0 - 17.0 (g/dL)   HCT 16.1 (*) 09.6 - 52.0 (%)   MCV 89.0  78.0 - 100.0 (fL)   MCH 29.9  26.0 - 34.0 (pg)   MCHC 33.6  30.0 - 36.0 (g/dL)   RDW 04.5  40.9 - 81.1 (%)   Platelets 174  150 - 400 (K/uL)   Dg Chest 2 View  07/13/2011  *RADIOLOGY REPORT*  Clinical Data: Right chest pain.  Weakness.   CHEST - 2 VIEW  Comparison: 06/07/2010  Findings: Heart and mediastinal contours are within normal limits. No focal opacities or effusions.  No acute bony abnormality. Improving interstitial opacities previously noted.  IMPRESSION: No active cardiopulmonary disease.  Original Report Authenticated By: Cyndie Chime, M.D.      Raad Clayson A. Patrica Duel, MD 07/13/11 1644   Patient to the CDU pending reassessment. Lab and imaging evaluation and will likely need bone consult from the infectious disease doctor. Disposition is pending  Theron Arista A. Patrica Duel, MD 07/13/11 1950

## 2011-07-13 NOTE — ED Provider Notes (Signed)
Discussed patient with Dr. Ninetta Lights (ID)--ok to discharge home with symptomatic treatment--will follow-up in clinic next week.  Jimmye Norman, NP 07/13/11 2133

## 2011-07-13 NOTE — ED Notes (Signed)
To ed for eval of tingling to different areas og body for the past 3-4 days.

## 2011-07-14 NOTE — ED Provider Notes (Signed)
Medical screening examination/treatment/procedure(s) were conducted as a shared visit with non-physician practitioner(s) and myself.  I personally evaluated the patient during the encounter   Phillip Gill A. Wilma Michaelson, MD 07/14/11 1459 

## 2011-07-16 ENCOUNTER — Telehealth: Payer: Self-pay | Admitting: *Deleted

## 2011-07-16 ENCOUNTER — Telehealth: Payer: Self-pay | Admitting: Internal Medicine

## 2011-07-16 ENCOUNTER — Encounter: Payer: Self-pay | Admitting: Infectious Diseases

## 2011-07-16 ENCOUNTER — Ambulatory Visit (INDEPENDENT_AMBULATORY_CARE_PROVIDER_SITE_OTHER): Payer: Self-pay | Admitting: Infectious Diseases

## 2011-07-16 ENCOUNTER — Other Ambulatory Visit: Payer: Self-pay | Admitting: *Deleted

## 2011-07-16 DIAGNOSIS — M5430 Sciatica, unspecified side: Secondary | ICD-10-CM

## 2011-07-16 DIAGNOSIS — Z79899 Other long term (current) drug therapy: Secondary | ICD-10-CM

## 2011-07-16 DIAGNOSIS — Z113 Encounter for screening for infections with a predominantly sexual mode of transmission: Secondary | ICD-10-CM

## 2011-07-16 DIAGNOSIS — B20 Human immunodeficiency virus [HIV] disease: Secondary | ICD-10-CM

## 2011-07-16 DIAGNOSIS — K5900 Constipation, unspecified: Secondary | ICD-10-CM

## 2011-07-16 DIAGNOSIS — K5909 Other constipation: Secondary | ICD-10-CM | POA: Insufficient documentation

## 2011-07-16 LAB — COMPREHENSIVE METABOLIC PANEL
AST: 30 U/L (ref 0–37)
Albumin: 4.1 g/dL (ref 3.5–5.2)
BUN: 17 mg/dL (ref 6–23)
Calcium: 8.9 mg/dL (ref 8.4–10.5)
Chloride: 102 mEq/L (ref 96–112)
Potassium: 3.8 mEq/L (ref 3.5–5.3)

## 2011-07-16 LAB — LIPID PANEL: HDL: 38 mg/dL — ABNORMAL LOW (ref >39)

## 2011-07-16 LAB — CBC
HCT: 42.5 % (ref 39.0–52.0)
Hemoglobin: 13.8 g/dL (ref 13.0–17.0)
MCV: 91.2 fL (ref 78.0–100.0)
RBC: 4.66 MIL/uL (ref 4.22–5.81)
RDW: 13.7 % (ref 11.5–15.5)
WBC: 8.6 10*3/uL (ref 4.0–10.5)

## 2011-07-16 MED ORDER — DOCUSATE SODIUM 100 MG PO CAPS: 100.0000 mg | ORAL_CAPSULE | Freq: Every day | ORAL | Status: DC | PRN

## 2011-07-16 MED ORDER — NAPROXEN 500 MG PO TABS: 500.0000 mg | ORAL_TABLET | Freq: Two times a day (BID) | ORAL | Status: DC

## 2011-07-16 MED ORDER — TRAMADOL HCL 50 MG PO TABS: 50.0000 mg | ORAL_TABLET | Freq: Four times a day (QID) | ORAL | Status: DC | PRN

## 2011-07-16 MED ORDER — CYCLOBENZAPRINE HCL 10 MG PO TABS: 10.0000 mg | ORAL_TABLET | Freq: Two times a day (BID) | ORAL | Status: DC | PRN

## 2011-07-16 MED ORDER — CYCLOBENZAPRINE HCL 10 MG PO TABS: 10.0000 mg | ORAL_TABLET | Freq: Two times a day (BID) | ORAL | Status: AC | PRN

## 2011-07-16 NOTE — Assessment & Plan Note (Signed)
Will check MRI of LS spine. Give pt ultram, flexeril. rtc 2 weeks.

## 2011-07-16 NOTE — Telephone Encounter (Signed)
States he went to the ED for leg swelling & pain. Wants to have a f/u appt. There were openings this am. Vikki Ports put him on Dr. Moshe Cipro schedule today at 10:30am

## 2011-07-16 NOTE — Telephone Encounter (Signed)
He wants [facility] on [address]. Sent electronically

## 2011-07-16 NOTE — Progress Notes (Signed)
Addended by: Kafi Dotter C on: 07/16/2011 11:10 AM   Modules accepted: Orders

## 2011-07-16 NOTE — Assessment & Plan Note (Signed)
Give him daily colace

## 2011-07-16 NOTE — Telephone Encounter (Signed)
Received VM from Phillip Gill.  He says he need help through patient assistance program.  I returned his call and left a VM.  I asked for him to return my call and let me know what meds he needs assistance with.

## 2011-07-16 NOTE — Assessment & Plan Note (Signed)
Appears to be doing well. Will recheck his labs today. Given condoms. His vax are up to date. See him back in 2 weeks with results.

## 2011-07-16 NOTE — Progress Notes (Signed)
  Subjective:    Patient ID: Phillip Gill, male    DOB: 10-Oct-1975, 36 y.o.   MRN: [id number]  HPI 36 yo M with HIV+ (2005) and hepatitis B. Last CD4 910 and VL 162 (05-08-11). He has been maintained on DRVr/TRV. Complains of sciatica- pain in his back, tingling in his L foot.  Was seen in ED 07-13-11 and had CT head (-), CXR (-). Having difficulty with constipation. Tried MgCitrate with good result.  Denies missed ART.     Review of Systems  Gastrointestinal: Positive for constipation. Negative for diarrhea.  Genitourinary: Negative for dysuria.  Musculoskeletal: Positive for back pain.  Neurological: Positive for numbness.       Objective:   Physical Exam  Constitutional: He appears well-developed and well-nourished.  HENT:  Head: Normocephalic.  Mouth/Throat: No oropharyngeal exudate.  Eyes: EOM are normal. Pupils are equal, round, and reactive to light.  Neck: Neck supple.  Cardiovascular: Normal rate, regular rhythm and normal heart sounds.   Pulmonary/Chest: Effort normal and breath sounds normal.  Abdominal: Soft. Bowel sounds are normal. There is no tenderness.  Lymphadenopathy:    He has no cervical adenopathy.  Neurological: He has normal strength. A sensory deficit is present.       Mild decrease in sensation on L. Strength is 5/5 bilateral LE (plantar/dorsiflexion, leg extension/retraction).           Assessment & Plan:

## 2011-07-16 NOTE — Telephone Encounter (Signed)
Pt had left a message asking for PAP help with these 2 meds.  They are both on the $4 list at either [facility] or [facility]. When he calls back find out which pharmacy he prefers

## 2011-07-17 LAB — T-HELPER CELL (CD4) - (RCID CLINIC ONLY)
CD4 % Helper T Cell: 27 % — ABNORMAL LOW (ref 33–55)
CD4 T Cell Abs: 980 uL (ref 400–2700)

## 2011-07-18 LAB — HIV-1 RNA ULTRAQUANT REFLEX TO GENTYP+: HIV-1 RNA Quant, Log: 1.77 {Log} — ABNORMAL HIGH (ref <1.30)

## 2011-07-19 ENCOUNTER — Ambulatory Visit (HOSPITAL_COMMUNITY)
Admission: RE | Admit: 2011-07-19 | Discharge: 2011-07-19 | Disposition: A | Discharge status: Home / Self Care | Payer: Self-pay | Source: Ambulatory Visit | Attending: Infectious Diseases | Admitting: Infectious Diseases

## 2011-07-19 ENCOUNTER — Other Ambulatory Visit: Payer: Self-pay | Admitting: Licensed Clinical Social Worker

## 2011-07-19 DIAGNOSIS — B20 Human immunodeficiency virus [HIV] disease: Secondary | ICD-10-CM

## 2011-07-19 DIAGNOSIS — M51379 Other intervertebral disc degeneration, lumbosacral region without mention of lumbar back pain or lower extremity pain: Secondary | ICD-10-CM | POA: Insufficient documentation

## 2011-07-19 DIAGNOSIS — M5137 Other intervertebral disc degeneration, lumbosacral region: Secondary | ICD-10-CM | POA: Insufficient documentation

## 2011-07-19 DIAGNOSIS — M5430 Sciatica, unspecified side: Secondary | ICD-10-CM

## 2011-07-19 MED ORDER — DARUNAVIR ETHANOLATE 800 MG PO TABS: 800.0000 mg | ORAL_TABLET | Freq: Every day | ORAL | Status: DC

## 2011-07-31 ENCOUNTER — Telehealth: Payer: Self-pay | Admitting: *Deleted

## 2011-07-31 ENCOUNTER — Ambulatory Visit (INDEPENDENT_AMBULATORY_CARE_PROVIDER_SITE_OTHER): Payer: Self-pay | Admitting: Internal Medicine

## 2011-07-31 ENCOUNTER — Encounter: Payer: Self-pay | Admitting: Internal Medicine

## 2011-07-31 VITALS — BP 136/87 | HR 89 | Temp 98.5°F | Ht 72.0 in | Wt 181.0 lb

## 2011-07-31 DIAGNOSIS — M5430 Sciatica, unspecified side: Secondary | ICD-10-CM

## 2011-07-31 DIAGNOSIS — B20 Human immunodeficiency virus [HIV] disease: Secondary | ICD-10-CM

## 2011-07-31 NOTE — Assessment & Plan Note (Signed)
It persists, but MRI is reassuring.  He is going to get physical therapy though the employer since it happened at work, or he will be referred for PT by myself if that is not possible.

## 2011-07-31 NOTE — Progress Notes (Signed)
  Subjective:    Patient ID: Phillip Gill, male    DOB: [birth year], 36 y.o.   MRN: [id number]  HPI here for follow up of his HIV and recent back injury.  Was seen by Dr. Ninetta Lights with back pain following an injury that he reports occurred during work.  Initially, he had nerve pain down his left leg and later developed more back spasm.  Had an MRI done due to concern but was not significant for any acute disk issues.  In regards to HIV, he continues to take his medications well and reports excellent adherence and tolerance.  He denies any missed doses.  He is here with his roommate, who has attended all of his visits.      Review of Systems  Constitutional: Negative for fever, chills and fatigue.  HENT: Negative for sore throat and trouble swallowing.   Respiratory: Negative for cough and shortness of breath.   Cardiovascular: Negative for chest pain, palpitations and leg swelling.  Gastrointestinal: Negative for nausea, abdominal pain and diarrhea.  Genitourinary: Negative for discharge and genital sores.  Musculoskeletal: Positive for back pain. Negative for myalgias, arthralgias and gait problem.  Skin: Negative for pallor and rash.  Neurological: Negative for dizziness and headaches.  Hematological: Negative for adenopathy.  Psychiatric/Behavioral: Negative for sleep disturbance and dysphoric mood.       Objective:   Physical Exam  Constitutional: He is oriented to person, place, and time. He appears well-developed and well-nourished. No distress.  HENT:  Mouth/Throat: Oropharynx is clear and moist. No oropharyngeal exudate.  Cardiovascular: Normal rate, regular rhythm and normal heart sounds.  Exam reveals no gallop and no friction rub.   No murmur heard. Pulmonary/Chest: Effort normal and breath sounds normal. No respiratory distress. He has no wheezes. He has no rales.  Abdominal: Soft. Bowel sounds are normal. He exhibits no distension. There is no tenderness. There is no  rebound.  Musculoskeletal:       + left lower back spasm  Lymphadenopathy:    He has no cervical adenopathy.  Neurological: He is alert and oriented to person, place, and time.  Skin: Skin is warm and dry. No rash noted. No erythema.          Assessment & Plan:

## 2011-07-31 NOTE — Assessment & Plan Note (Signed)
He is doing well and his CD4 has increased nicely.  I reminded him to use condoms and he was given a supply today.  He will return in 5 months for follow up, sooner if needed.

## 2011-07-31 NOTE — Telephone Encounter (Signed)
Referral made to dental clinic. Wendall Mola CMA

## 2011-09-05 ENCOUNTER — Other Ambulatory Visit: Payer: Self-pay

## 2011-09-05 ENCOUNTER — Ambulatory Visit: Payer: Self-pay

## 2011-09-20 ENCOUNTER — Ambulatory Visit: Payer: Self-pay | Admitting: Internal Medicine

## 2011-09-25 ENCOUNTER — Ambulatory Visit: Payer: Self-pay

## 2011-09-27 ENCOUNTER — Ambulatory Visit: Payer: Self-pay

## 2011-10-02 ENCOUNTER — Other Ambulatory Visit: Payer: Self-pay | Admitting: *Deleted

## 2011-10-02 ENCOUNTER — Ambulatory Visit: Payer: Self-pay

## 2011-10-02 DIAGNOSIS — B20 Human immunodeficiency virus [HIV] disease: Secondary | ICD-10-CM

## 2011-10-02 MED ORDER — EMTRICITABINE-TENOFOVIR DF 200-300 MG PO TABS: 1.0000 | ORAL_TABLET | Freq: Every day | ORAL | Status: DC

## 2011-10-02 MED ORDER — DARUNAVIR ETHANOLATE 800 MG PO TABS: 800.0000 mg | ORAL_TABLET | Freq: Every day | ORAL | Status: DC

## 2011-10-02 MED ORDER — RITONAVIR 100 MG PO TABS: 100.0000 mg | ORAL_TABLET | Freq: Every day | ORAL | Status: DC

## 2011-11-06 ENCOUNTER — Encounter (HOSPITAL_COMMUNITY): Payer: Self-pay | Admitting: *Deleted

## 2011-11-06 ENCOUNTER — Emergency Department (HOSPITAL_COMMUNITY)
Admission: EM | Admit: 2011-11-06 | Discharge: 2011-11-07 | Discharge status: Against Medical Advice | Payer: Self-pay | Attending: Emergency Medicine | Admitting: Emergency Medicine

## 2011-11-06 DIAGNOSIS — M549 Dorsalgia, unspecified: Secondary | ICD-10-CM | POA: Insufficient documentation

## 2011-11-06 NOTE — ED Notes (Signed)
Pt in c/o back pain radiating into right leg x1 week, no known injury

## 2011-11-07 ENCOUNTER — Encounter (HOSPITAL_COMMUNITY): Payer: Self-pay | Admitting: *Deleted

## 2011-11-07 ENCOUNTER — Emergency Department (HOSPITAL_COMMUNITY)
Admission: EM | Admit: 2011-11-07 | Discharge: 2011-11-07 | Disposition: A | Discharge status: Home / Self Care | Payer: Self-pay | Attending: Emergency Medicine | Admitting: Emergency Medicine

## 2011-11-07 ENCOUNTER — Telehealth: Payer: Self-pay | Admitting: *Deleted

## 2011-11-07 DIAGNOSIS — M5430 Sciatica, unspecified side: Secondary | ICD-10-CM | POA: Insufficient documentation

## 2011-11-07 DIAGNOSIS — M545 Low back pain, unspecified: Secondary | ICD-10-CM | POA: Insufficient documentation

## 2011-11-07 DIAGNOSIS — M549 Dorsalgia, unspecified: Secondary | ICD-10-CM | POA: Insufficient documentation

## 2011-11-07 DIAGNOSIS — F17200 Nicotine dependence, unspecified, uncomplicated: Secondary | ICD-10-CM | POA: Insufficient documentation

## 2011-11-07 DIAGNOSIS — Z21 Asymptomatic human immunodeficiency virus [HIV] infection status: Secondary | ICD-10-CM | POA: Insufficient documentation

## 2011-11-07 DIAGNOSIS — M546 Pain in thoracic spine: Secondary | ICD-10-CM | POA: Insufficient documentation

## 2011-11-07 DIAGNOSIS — I10 Essential (primary) hypertension: Secondary | ICD-10-CM | POA: Insufficient documentation

## 2011-11-07 LAB — POCT I-STAT, CHEM 8
BUN: 21 mg/dL (ref 6–23)
Creatinine, Ser: 1.2 mg/dL (ref 0.50–1.35)
Glucose, Bld: 94 mg/dL (ref 70–99)
Hemoglobin: 14.6 g/dL (ref 13.0–17.0)
Potassium: 4.4 mEq/L (ref 3.5–5.1)
Sodium: 143 mEq/L (ref 135–145)

## 2011-11-07 LAB — URINE MICROSCOPIC-ADD ON

## 2011-11-07 LAB — URINALYSIS, ROUTINE W REFLEX MICROSCOPIC
Glucose, UA: NEGATIVE mg/dL
Specific Gravity, Urine: 1.021 (ref 1.005–1.030)
Urobilinogen, UA: 0.2 mg/dL (ref 0.0–1.0)
pH: 5.5 (ref 5.0–8.0)

## 2011-11-07 MED ORDER — HYDROMORPHONE HCL PF 1 MG/ML IJ SOLN
1.0000 mg | Freq: Once | INTRAMUSCULAR | Status: AC
  Administered 2011-11-07: 1 mg via INTRAMUSCULAR
  Filled 2011-11-07: qty 1

## 2011-11-07 MED ORDER — DIAZEPAM 5 MG/ML IJ SOLN
5.0000 mg | Freq: Once | INTRAMUSCULAR | Status: DC
  Filled 2011-11-07: qty 2

## 2011-11-07 MED ORDER — TRAMADOL HCL 50 MG PO TABS: 50.0000 mg | ORAL_TABLET | Freq: Four times a day (QID) | ORAL | Status: DC | PRN

## 2011-11-07 MED ORDER — DIAZEPAM 5 MG PO TABS: 5.0000 mg | ORAL_TABLET | Freq: Two times a day (BID) | ORAL | Status: DC

## 2011-11-07 NOTE — ED Notes (Addendum)
C/o back pain, onset ~ 1 week ago, also intermitant R leg pain. Mentions urinary frequency. (Denies: fever, nvd, bleeding, urinary or bowel incontinence or other sx). Mentions, "went to [facility], then came here".

## 2011-11-07 NOTE — ED Provider Notes (Signed)
History     CSN: [id number]  Arrival date & time 11/07/11  0001   First MD Initiated Contact with Patient 11/07/11 0143      Chief Complaint  Patient presents with  . Back Pain    (Consider location/radiation/quality/duration/timing/severity/associated sxs/prior treatment) HPI Comments: Patient here with thoracic and lumbar back pain - states that has been diagnosed with sciatica in the past but he does not think this is related - review of previous records reveal that this has likely been going on for some time - he reports radiation of the pain into his right leg from the thigh to the shin - denies numbness, tingling, weakness, loss of control of bowels or bladder or urinary retention.  Denies fever, chills.  Patient is a 36 y.o. male presenting with back pain. The history is provided by the patient. No language interpreter was used.  Back Pain  This is a new problem. The current episode started more than 1 week ago. The problem occurs constantly. The problem has not changed since onset.The pain is associated with an MCA. The pain is present in the lumbar spine and thoracic spine. The quality of the pain is described as shooting. The pain radiates to the right knee and right foot. The pain is at a severity of 6/10. The pain is severe. The symptoms are aggravated by bending and twisting. The pain is the same all the time. Stiffness is present all day. Associated symptoms include leg pain. Pertinent negatives include no chest pain, no fever, no numbness, no weight loss, no headaches, no abdominal pain, no abdominal swelling, no bowel incontinence, no perianal numbness, no bladder incontinence, no dysuria, no pelvic pain, no paresthesias, no paresis, no tingling and no weakness. He has tried nothing for the symptoms. The treatment provided no relief.    Past Medical History  Diagnosis Date  . HIV infection   . Hypertension   . Sciatica of left side     History reviewed. No pertinent past  surgical history.  Family History  Problem Relation Age of Onset  . Mental illness Neg Hx   . Hypertension Mother   . Diabetes Mother   . Stroke Mother     cerbral aneurysm    History  Substance Use Topics  . Smoking status: Current Everyday Smoker -- 0.5 packs/day for 15 years    Types: Cigarettes  . Smokeless tobacco: Never Used  . Alcohol Use: Yes     every 2 weeks hard liquor      Review of Systems  Constitutional: Negative for fever and weight loss.  Cardiovascular: Negative for chest pain.  Gastrointestinal: Negative for abdominal pain and bowel incontinence.  Genitourinary: Negative for bladder incontinence, dysuria and pelvic pain.  Musculoskeletal: Positive for back pain.  Neurological: Negative for tingling, weakness, numbness, headaches and paresthesias.  All other systems reviewed and are negative.    Allergies  Bactrim and Sertraline hcl  Home Medications   Current Outpatient Rx  Name Route Sig Dispense Refill  . DARUNAVIR ETHANOLATE 800 MG PO TABS Oral Take 1 tablet (800 mg total) by mouth daily. 30 tablet 11  . DOCUSATE SODIUM 100 MG PO CAPS Oral Take 1 capsule (100 mg total) by mouth daily as needed for constipation. 30 capsule 1  . EMTRICITABINE-TENOFOVIR 200-300 MG PO TABS Oral Take 1 tablet by mouth daily. 30 tablet 11  . NAPROXEN 500 MG PO TABS Oral Take 1 tablet (500 mg total) by mouth 2 (two) times daily. 30  tablet 0  . RITONAVIR 100 MG PO TABS Oral Take 1 tablet (100 mg total) by mouth daily. 30 tablet 11  . DIAZEPAM 5 MG PO TABS Oral Take 1 tablet (5 mg total) by mouth 2 (two) times daily. 10 tablet 0  . TRAMADOL HCL 50 MG PO TABS Oral Take 1 tablet (50 mg total) by mouth every 6 (six) hours as needed for pain. 20 tablet 0    BP 138/96  Pulse 80  Temp(Src) 98 F (36.7 C) (Oral)  Resp 20  SpO2 98%  Physical Exam  Nursing note and vitals reviewed. Constitutional: He is oriented to person, place, and time. He appears well-developed and  well-nourished. No distress.  HENT:  Head: Normocephalic and atraumatic.  Right Ear: External ear normal.  Left Ear: External ear normal.  Nose: Nose normal.  Mouth/Throat: Oropharynx is clear and moist. No oropharyngeal exudate.  Eyes: Conjunctivae are normal. Pupils are equal, round, and reactive to light. No scleral icterus.  Neck: Normal range of motion. Neck supple.  Cardiovascular: Normal rate, regular rhythm and normal heart sounds.  Exam reveals no gallop and no friction rub.   No murmur heard. Pulmonary/Chest: Effort normal and breath sounds normal. No respiratory distress. He has no wheezes. He has no rales. He exhibits no tenderness.  Abdominal: Soft. Bowel sounds are normal. He exhibits no distension. There is no tenderness.  Musculoskeletal: He exhibits no edema.       Thoracic back: He exhibits tenderness and spasm. He exhibits normal range of motion and no bony tenderness.       Lumbar back: He exhibits tenderness, pain and spasm. He exhibits normal range of motion and no bony tenderness.  Lymphadenopathy:    He has no cervical adenopathy.  Neurological: He is alert and oriented to person, place, and time. He has normal reflexes. No cranial nerve deficit. He exhibits normal muscle tone. Coordination normal.       Normal gait - no sensory deficits  Skin: Skin is warm and dry. No rash noted. No erythema. No pallor.  Psychiatric: He has a normal mood and affect. His behavior is normal. Thought content normal.    ED Course  Procedures (including critical care time)   Labs Reviewed  POCT I-STAT, CHEM 8  URINALYSIS, ROUTINE W REFLEX MICROSCOPIC   No results found.   1. Back pain   2. Sciatica       MDM  Patient with a history of likely chronic lower back pain presents with worsening of this - there are no alarming signs and I do not suspect epidural abscess - chart reveals currently with normal CD4 count.  I have given the patient a short course of pain medication  and believe that he should follow up with his PCP for this.        Izola Price [place], [place] 11/07/11 [phone]

## 2011-11-07 NOTE — Telephone Encounter (Signed)
Patient called advised that he went to the ED last night for sever back pain. Advised he needs an appt for a follow up and wants to be seen today. Advised patient that we can not see him today and when they say to see your provider you can usually wait a couple of days. Looked at the schedule and advised him we can see him 11/19/11 at 245 and he advised that he already has an appointment coming up for labs and a follow up and he will just keep that.  Patient advised he wants a note from the doctor to give to his employer to allow him to change positions because his current one is to hard on his back and legs. Advised him he will have to talk to the provider himself about that but that I will note it.

## 2011-11-07 NOTE — ED Provider Notes (Signed)
Medical screening examination/treatment/procedure(s) were performed by non-physician practitioner and as supervising physician I was immediately available for consultation/collaboration.  Sunnie Nielsen, MD 11/07/11 [phone]

## 2011-11-07 NOTE — Discharge Instructions (Signed)
Back Pain, Adult Low back pain is very common. About 1 in 5 people have back pain.The cause of low back pain is rarely dangerous. The pain often gets better over time.About half of people with a sudden onset of back pain feel better in just 2 weeks. About 8 in 10 people feel better by 6 weeks.  CAUSES Some common causes of back pain include:  Strain of the muscles or ligaments supporting the spine.   Wear and tear (degeneration) of the spinal discs.   Arthritis.   Direct injury to the back.  DIAGNOSIS Most of the time, the direct cause of low back pain is not known.However, back pain can be treated effectively even when the exact cause of the pain is unknown.Answering your caregiver's questions about your overall health and symptoms is one of the most accurate ways to make sure the cause of your pain is not dangerous. If your caregiver needs more information, he or she may order lab work or imaging tests (X-rays or MRIs).However, even if imaging tests show changes in your back, this usually does not require surgery. HOME CARE INSTRUCTIONS For many people, back pain returns.Since low back pain is rarely dangerous, it is often a condition that people can learn to manageon their own.   Remain active. It is stressful on the back to sit or stand in one place. Do not sit, drive, or stand in one place for more than 30 minutes at a time. Take short walks on level surfaces as soon as pain allows.Try to increase the length of time you walk each day.   Do not stay in bed.Resting more than 1 or 2 days can delay your recovery.   Do not avoid exercise or work.Your body is made to move.It is not dangerous to be active, even though your back may hurt.Your back will likely heal faster if you return to being active before your pain is gone.   Pay attention to your body when you bend and lift. Many people have less discomfortwhen lifting if they bend their knees, keep the load close to their  bodies,and avoid twisting. Often, the most comfortable positions are those that put less stress on your recovering back.   Find a comfortable position to sleep. Use a firm mattress and lie on your side with your knees slightly bent. If you lie on your back, put a pillow under your knees.   Only take over-the-counter or prescription medicines as directed by your caregiver. Over-the-counter medicines to reduce pain and inflammation are often the most helpful.Your caregiver may prescribe muscle relaxant drugs.These medicines help dull your pain so you can more quickly return to your normal activities and healthy exercise.   Put ice on the injured area.   Put ice in a plastic bag.   Place a towel between your skin and the bag.   Leave the ice on for 15 to 20 minutes, 3 to 4 times a day for the first 2 to 3 days. After that, ice and heat may be alternated to reduce pain and spasms.   Ask your caregiver about trying back exercises and gentle massage. This may be of some benefit.   Avoid feeling anxious or stressed.Stress increases muscle tension and can worsen back pain.It is important to recognize when you are anxious or stressed and learn ways to manage it.Exercise is a great option.  SEEK MEDICAL CARE IF:  You have pain that is not relieved with rest or medicine.   You have   pain that does not improve in 1 week.   You have new symptoms.   You are generally not feeling well.  SEEK IMMEDIATE MEDICAL CARE IF:   You have pain that radiates from your back into your legs.   You develop new bowel or bladder control problems.   You have unusual weakness or numbness in your arms or legs.   You develop nausea or vomiting.   You develop abdominal pain.   You feel faint.  Document Released: 06/18/2005 Document Revised: 06/07/2011 Document Reviewed: 11/06/2010 [employer] Patient Information 2012 [employer]. 

## 2011-11-08 ENCOUNTER — Emergency Department (HOSPITAL_COMMUNITY)
Admission: EM | Admit: 2011-11-08 | Discharge: 2011-11-08 | Disposition: A | Discharge status: Home / Self Care | Payer: Self-pay | Attending: Emergency Medicine | Admitting: Emergency Medicine

## 2011-11-08 ENCOUNTER — Encounter (HOSPITAL_COMMUNITY): Payer: Self-pay | Admitting: Emergency Medicine

## 2011-11-08 DIAGNOSIS — M545 Low back pain, unspecified: Secondary | ICD-10-CM | POA: Insufficient documentation

## 2011-11-08 DIAGNOSIS — M549 Dorsalgia, unspecified: Secondary | ICD-10-CM | POA: Insufficient documentation

## 2011-11-08 DIAGNOSIS — Z21 Asymptomatic human immunodeficiency virus [HIV] infection status: Secondary | ICD-10-CM | POA: Insufficient documentation

## 2011-11-08 DIAGNOSIS — Z79899 Other long term (current) drug therapy: Secondary | ICD-10-CM | POA: Insufficient documentation

## 2011-11-08 MED ORDER — MORPHINE SULFATE 4 MG/ML IJ SOLN
4.0000 mg | Freq: Once | INTRAMUSCULAR | Status: AC
  Administered 2011-11-08: 4 mg via INTRAMUSCULAR
  Filled 2011-11-08: qty 1

## 2011-11-08 MED ORDER — DIAZEPAM 5 MG/ML IJ SOLN
5.0000 mg | Freq: Once | INTRAMUSCULAR | Status: AC
  Administered 2011-11-08: 5 mg via INTRAMUSCULAR
  Filled 2011-11-08: qty 2

## 2011-11-08 NOTE — ED Provider Notes (Signed)
History     CSN: [id number]  Arrival date & time 11/08/11  1816   First MD Initiated Contact with Patient 11/08/11 2012      Chief Complaint  Patient presents with  . Back Pain    HPI  History provided by the patient. Patient is a 36 year old male with history of HIV and degenerative disc disease who presents with complaints of persistent low back pains. Patient was evaluated for same yesterday but states he has not prescriptions. Patient has history of chronic issues and has had her MRI in January showing slight bulging discs. Patient denies any change in pain. He denies any new injury or trauma. Patient denies any fecal or urinary incontinence. Patient denies any perineal numbness or urinary retention. he denies any fever, chills, sweats, weight loss. Pain is worse with movements and walking. Patient has still been hematuria and functioning with normal daily activity.    Past Medical History  Diagnosis Date  . HIV infection   . Sciatica of left side     History reviewed. No pertinent past surgical history.  Family History  Problem Relation Age of Onset  . Mental illness Neg Hx   . Hypertension Mother   . Diabetes Mother   . Stroke Mother     cerbral aneurysm    History  Substance Use Topics  . Smoking status: Current Everyday Smoker -- 0.5 packs/day for 15 years    Types: Cigarettes  . Smokeless tobacco: Never Used  . Alcohol Use: Yes     every 2 weeks hard liquor      Review of Systems  Constitutional: Negative for fever, chills, diaphoresis and unexpected weight change.  HENT: Negative for neck pain.   Respiratory: Negative for shortness of breath.   Cardiovascular: Negative for chest pain.  Gastrointestinal: Negative for nausea, vomiting and abdominal pain.  Genitourinary: Negative for dysuria, frequency, hematuria and flank pain.  Musculoskeletal: Positive for back pain. Negative for myalgias and joint swelling.    Allergies  Bactrim; Other; and  Sertraline hcl  Home Medications   Current Outpatient Rx  Name Route Sig Dispense Refill  . DARUNAVIR ETHANOLATE 800 MG PO TABS Oral Take 1 tablet (800 mg total) by mouth daily. 30 tablet 11  . EMTRICITABINE-TENOFOVIR 200-300 MG PO TABS Oral Take 1 tablet by mouth daily. 30 tablet 11  . RITONAVIR 100 MG PO TABS Oral Take 1 tablet (100 mg total) by mouth daily. 30 tablet 11  . DIAZEPAM 5 MG PO TABS Oral Take 1 tablet (5 mg total) by mouth 2 (two) times daily. 10 tablet 0  . DOCUSATE SODIUM 100 MG PO CAPS Oral Take 1 capsule (100 mg total) by mouth daily as needed for constipation. 30 capsule 1  . NAPROXEN 500 MG PO TABS Oral Take 1 tablet (500 mg total) by mouth 2 (two) times daily. 30 tablet 0  . TRAMADOL HCL 50 MG PO TABS Oral Take 1 tablet (50 mg total) by mouth every 6 (six) hours as needed for pain. 20 tablet 0    BP 138/103  Pulse 79  Temp(Src) 98 F (36.7 C) (Oral)  Resp 20  SpO2 98%  Physical Exam  Nursing note and vitals reviewed. Constitutional: He is oriented to person, place, and time. He appears well-developed and well-nourished. No distress.  HENT:  Head: Normocephalic.  Cardiovascular: Normal rate and regular rhythm.   Pulmonary/Chest: Effort normal and breath sounds normal. He has no wheezes. He has no rales.  Abdominal: Soft. There  is no tenderness.  Musculoskeletal:       Mild lower lumbar tenderness. No deformity. Slightly reduced range of motion and low back.  Neurological: He is alert and oriented to person, place, and time. He has normal strength. No sensory deficit. Gait normal.       Normal heel and toe walking. No perineal numbness. Normal strength in lower extremities.  Psychiatric: He has a normal mood and affect. His behavior is normal.    ED Course  Procedures       1. Back pain       MDM  Pt seen and evaluated.  Pt in no acute distress.     Patient is afebrile with no like symptoms for back pain. No concerning findings on exam to  suggest infections. Will Discharge at this time.    Angus Seller, [place] 11/08/11 2255

## 2011-11-08 NOTE — ED Provider Notes (Signed)
Medical screening examination/treatment/procedure(s) were performed by non-physician practitioner and as supervising physician I was immediately available for consultation/collaboration.   Benny Lennert, MD 11/08/11 2325

## 2011-11-08 NOTE — Discharge Instructions (Signed)
Please use medications were prescribed as instructed with your symptoms. Please followup to primary care provider or back specialist for continued evaluation and treatment. Use the information provided below for general exercises and stretching.   Back Exercises Back exercises help treat and prevent back injuries. The goal of back exercises is to increase the strength of your abdominal and back muscles and the flexibility of your back. These exercises should be started when you no longer have back pain. Back exercises include:  Pelvic Tilt. Lie on your back with your knees bent. Tilt your pelvis until the lower part of your back is against the floor. Hold this position 5 to 10 sec and repeat 5 to 10 times.   Knee to Chest. Pull first 1 knee up against your chest and hold for 20 to 30 seconds, repeat this with the other knee, and then both knees. This may be done with the other leg straight or bent, whichever feels better.   Sit-Ups or Curl-Ups. Bend your knees 90 degrees. Start with tilting your pelvis, and do a partial, slow sit-up, lifting your trunk only 30 to 45 degrees off the floor. Take at least 2 to 3 seconds for each sit-up. Do not do sit-ups with your knees out straight. If partial sit-ups are difficult, simply do the above but with only tightening your abdominal muscles and holding it as directed.   Hip-Lift. Lie on your back with your knees flexed 90 degrees. Push down with your feet and shoulders as you raise your hips a couple inches off the floor; hold for 10 seconds, repeat 5 to 10 times.   Back arches. Lie on your stomach, propping yourself up on bent elbows. Slowly press on your hands, causing an arch in your low back. Repeat 3 to 5 times. Any initial stiffness and discomfort should lessen with repetition over time.   Shoulder-Lifts. Lie face down with arms beside your body. Keep hips and torso pressed to floor as you slowly lift your head and shoulders off the floor.  Do not  overdo your exercises, especially in the beginning. Exercises may cause you some mild back discomfort which lasts for a few minutes; however, if the pain is more severe, or lasts for more than 15 minutes, do not continue exercises until you see your caregiver. Improvement with exercise therapy for back problems is slow.  See your caregivers for assistance with developing a proper back exercise program. Document Released: 07/26/2004 Document Revised: 06/07/2011 Document Reviewed: 06/18/2005 [facility] Patient Information 2012 [place], [place].   Back Pain, Adult Low back pain is very common. About 1 in 5 people have back pain.The cause of low back pain is rarely dangerous. The pain often gets better over time.About half of people with a sudden onset of back pain feel better in just 2 weeks. About 8 in 10 people feel better by 6 weeks.  CAUSES Some common causes of back pain include:  Strain of the muscles or ligaments supporting the spine.   Wear and tear (degeneration) of the spinal discs.   Arthritis.   Direct injury to the back.  DIAGNOSIS Most of the time, the direct cause of low back pain is not known.However, back pain can be treated effectively even when the exact cause of the pain is unknown.Answering your caregiver's questions about your overall health and symptoms is one of the most accurate ways to make sure the cause of your pain is not dangerous. If your caregiver needs more information, he or she  may order lab work or imaging tests (X-rays or MRIs).However, even if imaging tests show changes in your back, this usually does not require surgery. HOME CARE INSTRUCTIONS For many people, back pain returns.Since low back pain is rarely dangerous, it is often a condition that people can learn to [facility] their own.   Remain active. It is stressful on the back to sit or stand in one place. Do not sit, drive, or stand in one place for more than 30 minutes at a time. Take short walks on  level surfaces as soon as pain allows.Try to increase the length of time you walk each day.   Do not stay in bed.Resting more than 1 or 2 days can delay your recovery.   Do not avoid exercise or work.Your body is made to move.It is not dangerous to be active, even though your back may hurt.Your back will likely heal faster if you return to being active before your pain is gone.   Pay attention to your body when you bend and lift. Many people have less discomfortwhen lifting if they bend their knees, keep the load close to their bodies,and avoid twisting. Often, the most comfortable positions are those that put less stress on your recovering back.   Find a comfortable position to sleep. Use a firm mattress and lie on your side with your knees slightly bent. If you lie on your back, put a pillow under your knees.   Only take over-the-counter or prescription medicines as directed by your caregiver. Over-the-counter medicines to reduce pain and inflammation are often the most helpful.Your caregiver may prescribe muscle relaxant drugs.These medicines help dull your pain so you can more quickly return to your normal activities and healthy exercise.   Put ice on the injured area.   Put ice in a plastic bag.   Place a towel between your skin and the bag.   Leave the ice on for 15 to 20 minutes, 3 to 4 times a day for the first 2 to 3 days. After that, ice and heat may be alternated to reduce pain and spasms.   Ask your caregiver about trying back exercises and gentle massage. This may be of some benefit.   Avoid feeling anxious or stressed.Stress increases muscle tension and can worsen back pain.It is important to recognize when you are anxious or stressed and learn ways to manage it.Exercise is a great option.  SEEK MEDICAL CARE IF:  You have pain that is not relieved with rest or medicine.   You have pain that does not improve in 1 week.   You have new symptoms.   You are  generally not feeling well.  SEEK IMMEDIATE MEDICAL CARE IF:   You have pain that radiates from your back into your legs.   You develop new bowel or bladder control problems.   You have unusual weakness or numbness in your arms or legs.   You develop nausea or vomiting.   You develop abdominal pain.   You feel faint.  Document Released: 06/18/2005 Document Revised: 06/07/2011 Document Reviewed: 11/06/2010 [facility] Patient Information 2012 [place], [place].

## 2011-11-08 NOTE — ED Notes (Signed)
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

## 2011-11-12 ENCOUNTER — Telehealth: Payer: Self-pay | Admitting: *Deleted

## 2011-11-12 NOTE — Telephone Encounter (Signed)
He came in asking if md could complete his FMLA forms for his job at [employer]. Cites back issues & chronic nature of his HIV. I told him we will get it done asap. He is to go to work tomorrow & asked if he should. I told him that it is up to him & how he feels. Will complete as much as I can & give to md for completion

## 2011-11-12 NOTE — Telephone Encounter (Signed)
fmla forms to Dr. Luciana Axe

## 2011-11-13 ENCOUNTER — Encounter: Payer: Self-pay | Admitting: *Deleted

## 2011-11-13 ENCOUNTER — Encounter: Payer: Self-pay | Admitting: Infectious Disease

## 2011-11-13 ENCOUNTER — Ambulatory Visit (INDEPENDENT_AMBULATORY_CARE_PROVIDER_SITE_OTHER): Payer: Self-pay | Admitting: Infectious Disease

## 2011-11-13 ENCOUNTER — Ambulatory Visit: Payer: Self-pay

## 2011-11-13 VITALS — BP 152/109 | HR 68 | Temp 98.4°F | Ht 72.0 in | Wt 192.0 lb

## 2011-11-13 DIAGNOSIS — B20 Human immunodeficiency virus [HIV] disease: Secondary | ICD-10-CM

## 2011-11-13 DIAGNOSIS — M5430 Sciatica, unspecified side: Secondary | ICD-10-CM

## 2011-11-13 MED ORDER — OXYCODONE-ACETAMINOPHEN 10-300 MG PO TABS: 1.0000 | ORAL_TABLET | Freq: Two times a day (BID) | ORAL | Status: AC | PRN

## 2011-11-13 NOTE — Assessment & Plan Note (Addendum)
Wrote for percocet 10s for one month. He will have to have this discussed with dr. Luciana Axe regarding any chronic scripts but this should control his pain for now and obviate need for further visits to the ED. After his visit one of th phelbotomists overheard dispute between pt and his partner regarding whether or not he needed to be away from work. There may be some other ulterior motives in play with regards to the two partners relationship. BUT this is hearsay and could be considered art future visit  NOTE I DID NOT MYSELF DIRECTLY OBSERVE, WITNESS OR HEAR THE CONVERSATION MENTIONED BETWEEN THE PATIENT AND HIS PARTNER. IT IS POSSIBLE THAT THE PHLEBOTOMIST WHO DID CLAIM TO OVERHEAR THIS CONVERSATION MENTIONED ABOVE MIS- UNDERSTOOD IT. I DID NOT MAKE ANY MEDICAL DECISIONS BASED ON THIS 2ND HAND INFORMATION.

## 2011-11-13 NOTE — Assessment & Plan Note (Signed)
Recheck labs today and have him fu with Dr. Luciana Axe at the end of the month

## 2011-11-13 NOTE — Progress Notes (Signed)
Subjective:    Patient ID: Phillip Gill, male    DOB: [birth year], 36 y.o.   MRN: [id number]  HPI  36 year old [place] American man with HIV nicely controlled with prezista norvir and truvada. He presents with exacerbation of lower back pain in lumbosacral area as well as midthoracic region. He has been in the ED at least twice in the past two weeks and was rx naproxen, ultram and valium. He states that the ultram helps "a little" but he feels sleepy afterwards. He says that the 3 percocet he had at home left over from prior visit work the best. I agreed to change him to percocet 10s to be taken BID PRN for pain not relieved by his naproxen and ditched the valium. He has absolutely no evidence of cauda equina or other severe back injury and his MRI spine is benign. He claims recent pain was exacerbated by PT and then work. I wrote letter of excuse for work for him. I spent greater than 45 minutes with the patient including greater than 50% of time in face to face counsel of the patient and in coordination of their care.   Review of Systems  Constitutional: Negative for fever, chills, diaphoresis, activity change, appetite change, fatigue and unexpected weight change.  HENT: Negative for congestion, sore throat, rhinorrhea, sneezing, trouble swallowing and sinus pressure.   Eyes: Negative for photophobia and visual disturbance.  Respiratory: Negative for cough, chest tightness, shortness of breath, wheezing and stridor.   Cardiovascular: Negative for chest pain, palpitations and leg swelling.  Gastrointestinal: Negative for nausea, vomiting, abdominal pain, diarrhea, constipation, blood in stool, abdominal distention and anal bleeding.  Genitourinary: Negative for dysuria, hematuria, flank pain and difficulty urinating.  Musculoskeletal: Negative for myalgias, back pain, joint swelling, arthralgias and gait problem.  Skin: Negative for color change, pallor, rash and wound.  Neurological:  Negative for dizziness, tremors, weakness and light-headedness.  Hematological: Negative for adenopathy. Does not bruise/bleed easily.  Psychiatric/Behavioral: Negative for behavioral problems, confusion, sleep disturbance, dysphoric mood, decreased concentration and agitation.       Objective:   Physical Exam  Constitutional: He is oriented to person, place, and time. He appears well-developed and well-nourished. No distress.  HENT:  Head: Normocephalic and atraumatic.  Mouth/Throat: Oropharynx is clear and moist. No oropharyngeal exudate.  Eyes: Conjunctivae and EOM are normal. Pupils are equal, round, and reactive to light. No scleral icterus.  Neck: Normal range of motion. Neck supple. No JVD present.  Cardiovascular: Normal rate, regular rhythm and normal heart sounds.  Exam reveals no gallop and no friction rub.   No murmur heard. Pulmonary/Chest: Effort normal and breath sounds normal. No respiratory distress. He has no wheezes. He has no rales. He exhibits no tenderness.  Abdominal: He exhibits no distension and no mass. There is no tenderness. There is no rebound and no guarding.  Musculoskeletal: He exhibits no edema and no tenderness.  Lymphadenopathy:    He has no cervical adenopathy.  Neurological: He is alert and oriented to person, place, and time. He has normal reflexes. He exhibits normal muscle tone. Coordination normal.  Skin: Skin is warm and dry. He is not diaphoretic. No erythema. No pallor.  Psychiatric: He has a normal mood and affect. His behavior is normal. Judgment and thought content normal.          Assessment & Plan:  HIV disease Recheck labs today and have him fu with Dr. Luciana Axe at the end of the month  Sciatica Wrote for percocet 10s for one month. He will have to have this discussed with dr. Luciana Axe regarding any chronic scripts but this should control his pain for now and obviate need for further visits to the ED. After his visit one of th  phelbotomists overheard dispute between pt and his partner regarding whether or not he needed to be away from work. There may be some other ulterior motives in play with regards to the two partners relationship. BUT this is hearsay and could be considered art future visit

## 2011-11-14 LAB — COMPLETE METABOLIC PANEL WITH GFR
ALT: 19 U/L (ref 0–53)
AST: 30 U/L (ref 0–37)
Albumin: 4.1 g/dL (ref 3.5–5.2)
CO2: 28 mEq/L (ref 19–32)
Calcium: 9.2 mg/dL (ref 8.4–10.5)
Chloride: 106 mEq/L (ref 96–112)
Creat: 1.09 mg/dL (ref 0.50–1.35)
GFR, Est African American: >89 mL/min
Potassium: 3.9 mEq/L (ref 3.5–5.3)
Sodium: 140 mEq/L (ref 135–145)
Total Protein: 8.1 g/dL (ref 6.0–8.3)

## 2011-11-14 LAB — CBC WITH DIFFERENTIAL/PLATELET
Basophils Relative: 0 % (ref 0–1)
Eosinophils Absolute: 0.1 10*3/uL (ref 0.0–0.7)
Eosinophils Relative: 1 % (ref 0–5)
Lymphs Abs: 2.9 10*3/uL (ref 0.7–4.0)
MCH: 29.5 pg (ref 26.0–34.0)
MCHC: 31.6 g/dL (ref 30.0–36.0)
MCV: 93.3 fL (ref 78.0–100.0)
Neutrophils Relative %: 54 % (ref 43–77)
Platelets: 169 10*3/uL (ref 150–400)
RBC: 4.61 MIL/uL (ref 4.22–5.81)
RDW: 13.2 % (ref 11.5–15.5)

## 2011-11-15 ENCOUNTER — Other Ambulatory Visit: Payer: Self-pay

## 2011-11-15 LAB — T-HELPER CELL (CD4) - (RCID CLINIC ONLY): CD4 % Helper T Cell: 31 % — ABNORMAL LOW (ref 33–55)

## 2011-11-15 LAB — HIV-1 RNA QUANT-NO REFLEX-BLD: HIV 1 RNA Quant: <20 copies/mL (ref <20)

## 2011-11-16 ENCOUNTER — Other Ambulatory Visit: Payer: Self-pay | Admitting: *Deleted

## 2011-11-16 ENCOUNTER — Telehealth: Payer: Self-pay | Admitting: *Deleted

## 2011-11-16 DIAGNOSIS — M5430 Sciatica, unspecified side: Secondary | ICD-10-CM

## 2011-11-16 MED ORDER — OXYCODONE-ACETAMINOPHEN 10-325 MG PO TABS: 1.0000 | ORAL_TABLET | Freq: Four times a day (QID) | ORAL | Status: DC | PRN

## 2011-11-16 MED ORDER — OXYCODONE-ACETAMINOPHEN 10-325 MG PO TABS: 1.0000 | ORAL_TABLET | Freq: Two times a day (BID) | ORAL | Status: AC | PRN

## 2011-11-16 NOTE — Telephone Encounter (Signed)
Patient dropped off FMLA forms from [facility].  Dr. Luciana Axe filled it out, allowing the patient 2 sick days per month for his doctor appointments and periodic back pain.  Called the patient to notify that the forms were ready for pick up.  Made copies and left in folder up front, he will pick up on Monday 11/19/11. Wendall Mola CMA

## 2011-11-16 NOTE — Telephone Encounter (Signed)
Patient original Rx written for 10/300 and the pharmacy does not carry that formulation. Reprinted per Dr Drue Second for 10/325 #60 with no refills to take 1 tab every 12 hours as needed.

## 2011-11-21 ENCOUNTER — Telehealth: Payer: Self-pay | Admitting: *Deleted

## 2011-11-21 NOTE — Telephone Encounter (Signed)
Called patient and left voice mail, he needs an appointment with Dr. Luciana Axe to redo the FMLA form and discuss his ongoing back pain. Wendall Mola CMA

## 2011-11-29 ENCOUNTER — Ambulatory Visit: Payer: Self-pay | Admitting: Internal Medicine

## 2011-11-30 ENCOUNTER — Ambulatory Visit (INDEPENDENT_AMBULATORY_CARE_PROVIDER_SITE_OTHER): Payer: Self-pay | Admitting: Internal Medicine

## 2011-11-30 ENCOUNTER — Encounter: Payer: Self-pay | Admitting: Internal Medicine

## 2011-11-30 VITALS — BP 159/103 | HR 78 | Temp 98.1°F | Wt 191.0 lb

## 2011-11-30 DIAGNOSIS — B1910 Unspecified viral hepatitis B without hepatic coma: Secondary | ICD-10-CM

## 2011-11-30 DIAGNOSIS — B20 Human immunodeficiency virus [HIV] disease: Secondary | ICD-10-CM

## 2011-11-30 DIAGNOSIS — I10 Essential (primary) hypertension: Secondary | ICD-10-CM

## 2011-11-30 DIAGNOSIS — M5430 Sciatica, unspecified side: Secondary | ICD-10-CM

## 2011-11-30 NOTE — Assessment & Plan Note (Signed)
He is doing very well with his regimen.  Good tolerrance.  He will continue.  He was offereed and given condoms.  RTC in 4 months.

## 2011-11-30 NOTE — Assessment & Plan Note (Signed)
He is being evaluated and managed though his work.  I have offered to refer to PT if needed.  At this time he has declined since he has already been referred to ortho by work.  Told to let us know if he needs further evaluation or help with his sciatica.

## 2011-11-30 NOTE — Progress Notes (Signed)
  Subjective:    Patient ID: Phillip Gill, male    DOB: Jan 09, 1976, 36 y.o.   MRN: [id number]  HPI Here for follow up of his HIV and hepatitis B.  On Darunivir boosted and Truvada.  Reports continued excellent compliance and denies any missed doses.  He complains of continued pain in his back.  He has mentioned this for several visits and has had an MRI which did not show any significant disease.  He is undergoing evaluation through his work and is being referred to an orthopedist by them.  He has gone through physical therapy.  He denies any more numbness or tingling.  He otherwise has no new complaints.     Review of Systems  Constitutional: Negative for fever, chills, fatigue and unexpected weight change.  HENT: Negative for sore throat and trouble swallowing.   Respiratory: Negative for cough and shortness of breath.   Cardiovascular: Negative for chest pain, palpitations and leg swelling.  Gastrointestinal: Negative for nausea, abdominal pain, diarrhea and constipation.  Musculoskeletal: Positive for back pain. Negative for myalgias, joint swelling and arthralgias.  Skin: Negative for rash.  Neurological: Negative for facial asymmetry, weakness, numbness and headaches.  Hematological: Negative for adenopathy.  Psychiatric/Behavioral: Negative for dysphoric mood. The patient is not nervous/anxious.        Objective:   Physical Exam  Constitutional: He appears well-developed and well-nourished. No distress.  HENT:  Mouth/Throat: Oropharynx is clear and moist. No oropharyngeal exudate.  Cardiovascular: Normal rate, regular rhythm and normal heart sounds.  Exam reveals no gallop and no friction rub.   No murmur heard. Pulmonary/Chest: Effort normal and breath sounds normal. No respiratory distress. He has no wheezes. He has no rales.  Abdominal: Soft. Bowel sounds are normal. He exhibits no distension. There is no tenderness. There is no rebound.  Lymphadenopathy:    He has no  cervical adenopathy.  Skin: No rash noted.          Assessment & Plan:

## 2011-11-30 NOTE — Assessment & Plan Note (Signed)
Elevated today but probably partially related to the pain.  He has a PCP appt next week and will defer antihypertensives to them.  He can get meds off of the ADAP list (norvasc, lisinopril, HCTZ) that will be covered by prescribing to the same pharmacy, if needed.

## 2011-11-30 NOTE — Assessment & Plan Note (Signed)
On Truvada.  E Ag positive.  Will monitor periodically with AFP.  Initial quant DNA unfortunately was qualitative and not quant.  Reminded to avoid hepatotoxic agents like alcohol.

## 2011-12-06 ENCOUNTER — Ambulatory Visit (INDEPENDENT_AMBULATORY_CARE_PROVIDER_SITE_OTHER): Payer: Self-pay | Admitting: Family Medicine

## 2011-12-06 ENCOUNTER — Encounter: Payer: Self-pay | Admitting: Family Medicine

## 2011-12-06 VITALS — BP 137/95 | HR 73 | Ht 72.84 in | Wt 190.0 lb

## 2011-12-06 DIAGNOSIS — I10 Essential (primary) hypertension: Secondary | ICD-10-CM

## 2011-12-06 DIAGNOSIS — F329 Major depressive disorder, single episode, unspecified: Secondary | ICD-10-CM

## 2011-12-06 DIAGNOSIS — F32A Depression, unspecified: Secondary | ICD-10-CM

## 2011-12-06 DIAGNOSIS — F411 Generalized anxiety disorder: Secondary | ICD-10-CM

## 2011-12-06 DIAGNOSIS — F3289 Other specified depressive episodes: Secondary | ICD-10-CM

## 2011-12-06 DIAGNOSIS — B20 Human immunodeficiency virus [HIV] disease: Secondary | ICD-10-CM

## 2011-12-06 DIAGNOSIS — F419 Anxiety disorder, unspecified: Secondary | ICD-10-CM

## 2011-12-06 NOTE — Patient Instructions (Signed)
Blood pressure: Your goal is less than 140/90- Keep logbook of blood pressures- 3-4 x per week.  Return in 1-2 months to discuss in more detail.  Natural ways to decrease blood pressure: Relaxation techniques Reduce salt intake.  Exercise daily. Stop smoking.-- consider calling and making an appointment with Dr. Raymondo Band for smoking cessation class.      Sodium-Controlled Diet Sodium is a mineral. It is found in many foods. Sodium may be found naturally or added during the making of a food. The most common form of sodium is salt, which is made up of sodium and chloride. Reducing your sodium intake involves changing your eating habits. The following guidelines will help you reduce the sodium in your diet:  Stop using the salt shaker.   Use salt sparingly in cooking and baking.   Substitute with sodium-free seasonings and spices.   Do not use a salt substitute (potassium chloride) without your caregiver's permission.   Include a variety of fresh, unprocessed foods in your diet.   Limit the use of processed and convenience foods that are high in sodium.  USE THE FOLLOWING FOODS SPARINGLY: Breads/Starches  Commercial bread stuffing, commercial pancake or waffle mixes, coating mixes. Waffles. Croutons. Prepared (boxed or frozen) potato, rice, or noodle mixes that contain salt or sodium. Salted [place] fries or hash browns. Salted popcorn, breads, crackers, chips, or snack foods.  Vegetables  Vegetables canned with salt or prepared in cream, butter, or cheese sauces. Sauerkraut. Tomato or vegetable juices canned with salt.   Fresh vegetables are allowed if rinsed thoroughly.  Fruit  Fruit is okay to eat.  Meat and Meat Substitutes  Salted or smoked meats, such as bacon or Canadian bacon, chipped or corned beef, hot dogs, salt pork, luncheon meats, pastrami, ham, or sausage. Canned or smoked fish, poultry, or meat. Processed cheese or cheese spreads, blue or [place] cheese. Battered  or frozen fish products. Prepared spaghetti sauce. Baked beans. Reuben sandwiches. Salted nuts. Caviar.  Milk  Limit buttermilk to 1 cup per week.  Soups and Combination Foods  Bouillon cubes, canned or dried soups, broth, consomm. Convenience (frozen or packaged) dinners with more than 600 mg sodium. Pot pies, pizza, Asian food, fast food cheeseburgers, and specialty sandwiches.  Desserts and Sweets  Regular (salted) desserts, pie, commercial fruit snack pies, commercial snack cakes, canned puddings.   Eat desserts and sweets in moderation.  Fats and Oils  Gravy mixes or canned gravy. No more than 1 to 2 tbs of salad dressing. Chip dips.   Eat fats and oils in moderation.  Beverages  See those listed under the vegetables and milk groups.  Condiments  Ketchup, mustard, meat sauces, salsa, regular (salted) and lite soy sauce or mustard. Dill pickles, olives, meat tenderizer. Prepared horseradish or pickle relish. Dutch-processed cocoa. Baking powder or baking soda used medicinally. Worcestershire sauce. "Light" salt. Salt substitute, unless approved by your caregiver.  Document Released: 12/08/2001 Document Revised: 06/07/2011 Document Reviewed: 07/11/2009 [facility] Patient Information 2012 [place], [place].

## 2011-12-06 NOTE — Progress Notes (Signed)
  Subjective:    Patient ID: Phillip Gill, male    DOB: 19-Dec-1975, 36 y.o.   MRN: [id number]  HPI Patient here to establish care in our practice.  PMH, Surgical history, Family History, allergies, medications and social history all updated under appropriate tabs.    Elevated blood pressure: Patient does not have a past medical history of hypertension-but has noticed his blood pressure does sometimes run into the 140s systolic and 100s diastolic. Patient would like blood pressure be checked today. On recheck it was 130/95. Patient has not been writing down blood pressure levels from home. Did not bring log book. No dizziness. No headaches. No syncope. No vision changes.  Anxiety/depression: Past history of this. Is currently seeing a therapist [name] the patient was referred to by infectious disease- Dr. Luciana Axe.  Patient states that he does not feel that he needs medication at this time for these issues. He feels that therapy is helping him sufficiently. On exam with pHQ9 he states that he has decreased energy and guilt feelings nearly every day.  More than half of the days-has problem with appetite, rales concentration, as he is moving slower than others. In several days of the past 2 weeks-has had decreased interest or pleasure in doing things, feeling depressed, and problems with sleep. PHQ-9 score of 15.  No SI or HI.  HIV history of: Patient is under the care of Dr. Comer-infectious disease Dr. Patient states he is compliant with all medications. HIV is under good control currently. Patient follows up with Dr. Luciana Axe approximately every 4 months-per patient.  Smoking status reviewed.   Review of Systems    as per above. Objective:   Physical Exam  Constitutional: He appears well-developed and well-nourished.  HENT:  Head: Normocephalic and atraumatic.  Cardiovascular: Normal rate, regular rhythm and normal heart sounds.   No murmur heard. Pulmonary/Chest: Effort normal and  breath sounds normal. No respiratory distress. He has no wheezes. He has no rales.  Musculoskeletal: He exhibits no edema.  Neurological: He is alert.  Skin: No rash noted.  Psychiatric: He has a normal mood and affect. His behavior is normal.          Assessment & Plan:

## 2011-12-07 ENCOUNTER — Encounter (HOSPITAL_COMMUNITY): Payer: Self-pay | Admitting: *Deleted

## 2011-12-07 ENCOUNTER — Emergency Department (HOSPITAL_COMMUNITY)
Admission: EM | Admit: 2011-12-07 | Discharge: 2011-12-08 | Disposition: A | Discharge status: Home / Self Care | Payer: Self-pay | Attending: Emergency Medicine | Admitting: Emergency Medicine

## 2011-12-07 DIAGNOSIS — M542 Cervicalgia: Secondary | ICD-10-CM | POA: Insufficient documentation

## 2011-12-07 DIAGNOSIS — G8929 Other chronic pain: Secondary | ICD-10-CM | POA: Insufficient documentation

## 2011-12-07 DIAGNOSIS — F32A Depression, unspecified: Secondary | ICD-10-CM | POA: Insufficient documentation

## 2011-12-07 DIAGNOSIS — Z21 Asymptomatic human immunodeficiency virus [HIV] infection status: Secondary | ICD-10-CM | POA: Insufficient documentation

## 2011-12-07 DIAGNOSIS — F329 Major depressive disorder, single episode, unspecified: Secondary | ICD-10-CM | POA: Insufficient documentation

## 2011-12-07 DIAGNOSIS — F341 Dysthymic disorder: Secondary | ICD-10-CM | POA: Insufficient documentation

## 2011-12-07 DIAGNOSIS — R29898 Other symptoms and signs involving the musculoskeletal system: Secondary | ICD-10-CM | POA: Insufficient documentation

## 2011-12-07 NOTE — Assessment & Plan Note (Signed)
Pt is seeing therapist at outside facility currently.  Meeting weekly with therapist.  Pt feels that this is helping to manage his symptoms- is not interested in medication management for anxiety with SSRI at this time.  Will continue to follow this issue.

## 2011-12-07 NOTE — Assessment & Plan Note (Signed)
Pt to continue to see Dr. Luciana Axe for management of HIV.  Doing well with his regimen per Dr. Ephriam Knuckles note.  Pt to be seen every 4 months.

## 2011-12-07 NOTE — ED Notes (Signed)
The pt has a long history of some rt arm pain for one month worse today.  Also c/o neck pain

## 2011-12-07 NOTE — Assessment & Plan Note (Signed)
137/95 at today's appointment.  Pt to check bp at home and keep log book and bring to f/up appointment.

## 2011-12-07 NOTE — Assessment & Plan Note (Signed)
PHQ-9 score of 15 on depression screen so also meets criteria for depression in addition to diagnosis of anxiety given by Dr. Luciana Axe.  Pt doesn't want any medication management at this time.  Will continue to see therapist weekly.

## 2011-12-08 ENCOUNTER — Emergency Department (HOSPITAL_COMMUNITY): Payer: Self-pay

## 2011-12-08 MED ORDER — HYDROCODONE-ACETAMINOPHEN 5-325 MG PO TABS
1.0000 | ORAL_TABLET | Freq: Once | ORAL | Status: AC
  Administered 2011-12-08: 1 via ORAL
  Filled 2011-12-08: qty 1

## 2011-12-08 NOTE — ED Notes (Signed)
Patient transported to CT 

## 2011-12-08 NOTE — ED Provider Notes (Signed)
History     CSN: [id number]  Arrival date & time 12/07/11  2149   First MD Initiated Contact with Patient 12/07/11 2321      Chief Complaint  Patient presents with  . Extremity Weakness   HPI  History provided by the patient and significant other. Patient is a 36 year old male with history of HIV, anxiety depression, low back pain who presents with complaints of persistent back and neck pains. Patient states that he first injured his back 6 months ago has had persistent low back pain since that time. Patient now states pain has spread up to his neck area with radiation of pain and numbness symptoms to his right arm. Pain is worse when he pulls or lives with his arm. Patient denies having any new strenuous activity and states he has been on light duty at work. Patient has occasionally taken Tylenol or ibuprofen for his symptoms. He states this sometimes works but usually does not help much. Patient has been following up with his primary care provider for similar symptoms. Patient has had MRI studies of his low back demonstrating slight disc bulging. Patient otherwise denies any changes in symptoms. He denies any fever, chills, weight loss, night sweats, weakness or numbness in lower extremities, perineal numbness, urinary or fecal incontinence, urinary retention.     Past Medical History  Diagnosis Date  . Anxiety   . Depression   . Sciatica of left side   . HIV infection     followed by Dr. Luciana Axe- sees him every 4 months    History reviewed. No pertinent past surgical history.  Family History  Problem Relation Age of Onset  . Mental illness Neg Hx   . Hypertension Mother   . Diabetes Mother   . Stroke Mother     cerbral aneurysm  . Hypertension Brother     History  Substance Use Topics  . Smoking status: Current Everyday Smoker -- 0.5 packs/day for 15 years    Types: Cigarettes  . Smokeless tobacco: Never Used  . Alcohol Use: Yes     every 2 weeks hard liquor       Review of Systems  Constitutional: Negative for fever and chills.  HENT: Positive for neck pain.   Respiratory: Negative for cough and shortness of breath.   Cardiovascular: Negative for chest pain.  Gastrointestinal: Negative for nausea, vomiting and abdominal pain.  Musculoskeletal: Positive for back pain.  Neurological: Positive for weakness and numbness. Negative for headaches.    Allergies  Bactrim; Other; and Sertraline hcl  Home Medications   Current Outpatient Rx  Name Route Sig Dispense Refill  . DARUNAVIR ETHANOLATE 800 MG PO TABS Oral Take 1 tablet (800 mg total) by mouth daily. 30 tablet 11  . EMTRICITABINE-TENOFOVIR 200-300 MG PO TABS Oral Take 1 tablet by mouth daily. 30 tablet 11  . RITONAVIR 100 MG PO TABS Oral Take 1 tablet (100 mg total) by mouth daily. 30 tablet 11    BP 133/93  Pulse 82  Temp(Src) 98.2 F (36.8 C) (Oral)  Resp 16  SpO2 98%  Physical Exam  Nursing note and vitals reviewed. Constitutional: He is oriented to person, place, and time. He appears well-developed and well-nourished. No distress.  HENT:  Head: Normocephalic and atraumatic.  Neck: Normal range of motion. Neck supple.       No meningeal signs. Patient does have tenderness over bilateral trapezius areas and slightly over anterior mid cervical spine. No gross deformities.  Cardiovascular: Normal rate  and regular rhythm.   Pulmonary/Chest: Effort normal and breath sounds normal.  Abdominal: Soft.  Neurological: He is alert and oriented to person, place, and time. He has normal strength. No cranial nerve deficit or sensory deficit.       Strength is equal in upper extremities.  Skin: Skin is warm.  Psychiatric: He has a normal mood and affect. His behavior is normal.    ED Course  Procedures  Ct Cervical Spine Wo Contrast  12/08/2011  *RADIOLOGY REPORT*  Clinical Data: Posterior neck pain  CT CERVICAL SPINE WITHOUT CONTRAST  Technique:  Multidetector CT imaging of the  cervical spine was performed. Multiplanar CT image reconstructions were also generated.  Comparison: None.  Findings: Limited images through the posterior fossa show no acute abnormality.  Limited images through the lung apices are clear. Unremarkable thyroid gland.  Maintained craniocervical relationship.  No acute fracture or dislocation identified. There are mild multilevel degenerative changes.  Loss of normal cervical lordosis.  No prevertebral soft tissue swelling.  Diffuse mildly heterogeneous osseous density with areas of sclerosis for example involving the tendons.  IMPRESSION: Loss of normal cervical lordosis is nonspecific, may be secondary to positioning or muscle spasm.  Heterogeneous density to the vertebral bodies without cortical breakthrough. This is nonspecific however consider an MRI follow-up to exclude myeloproliferative disorders.  Original Report Authenticated By: Waneta Martins, M.D.     1. Chronic pain   2. Neck pain       MDM  Patient seen and evaluated. Patient no acute distress.   Patient feeling better after medications. I discussed CT findings with patient and recommendations for followup MRI. Patient expresses understanding will discuss with his doctor.     Angus Seller, PA 12/08/11 0400

## 2011-12-08 NOTE — Discharge Instructions (Signed)
You were seen and evaluated for your complaints of back and neck panis.  Your CAT scan did not show any signs for concerning or emergent causes for your symptoms.  Your CAT scan did show slight changes to some of the bones in your spine and neck and your provider today recommend that you followup with your primary care provider to consider an MRI of your neck. Please follow up with your primary care provider for continued evaluation and treatment of your symptoms.     Chronic Pain Chronic pain can be defined as pain that is lasting, off and on, and lasts for 3 to 6 months or longer. Many things cause chronic pain, which can make it difficult to make a discrete diagnosis. There are many treatment options available for chronic pain. However, finding a treatment that works well for you may require trying various approaches until a suitable one is found. CAUSES  In some types of chronic medical conditions, the pain is caused by a normal pain response within the body. A normal pain response helps the body identify illness or injury and prevent further damage from being done. In these cases, the cause of the pain may be identified and treated, even if it may not be cured completely. Examples of chronic conditions which can cause chronic pain include:  Inflammation of the joints (arthritis).   Back pain or neck pain (including bulging or herniated disks).   Migraine headaches.   Cancer.  In some other types of chronic pain syndromes, the pain is caused by an abnormal pain response within the body. An abnormal pain response is present when there is no ongoing cause (or stimulus) for the pain, or when the cause of the pain is arising from the nerves or nervous system itself. Examples of conditions which can cause chronic pain due to an abnormal pain response include:  Fibromyalgia.   Reflex sympathetic dystrophy (RSD).   Neuropathy (when the nerves themselves are damaged, and may cause pain).  DIAGNOSIS   Your caregiver will help diagnose your condition over time. In many cases, the initial focus will be on excluding conditions that could be causing the pain. Depending on your symptoms, your caregiver may order some tests to diagnose your condition. Some of these tests include:  Blood tests.   Computerized X-ray scans (CT scan).   Computerized magnetic scans (MRI).   X-rays.   Ultrasounds.   Nerve conduction studies.   Consultation with other physicians or specialists.  TREATMENT  There are many treatment options for people suffering from chronic pain. Finding a treatment that works well may take time.   You may be referred to a pain management specialist.   You may be put on medication to help with the pain. Unfortunately, some medications (such as opiate medications) may not be very effective in cases where chronic pain is due to abnormal pain responses. Finding the right medications can take some time.   Adjunctive therapies may be used to provide additional relief and improve a patient's quality of life. These therapies include:   Mindfulness meditation.   Acupuncture.   Biofeedback.   Cognitive-behavioral therapy.   In certain cases, surgical interventions may be attempted.  HOME CARE INSTRUCTIONS   Make sure you understand these instructions prior to discharge.   Ask any questions and share any further concerns you have with your caregiver prior to discharge.   Take all medications as directed by your caregiver.   Keep all follow-up appointments.  SEEK MEDICAL  CARE IF:   Your pain gets worse.   You develop a new pain that was not present before.   You cannot tolerate any medications prescribed by your caregiver.   You develop new symptoms since your last visit with your caregiver.  SEEK IMMEDIATE MEDICAL CARE IF:   You develop muscular weakness.   You have decreased sensation or numbness.   You lose control of bowel or bladder function.   Your pain  suddenly gets much worse.   You have an oral temperature above 102 F (38.9 C), not controlled by medication.   You develop shaking chills, confusion, chest pain, or shortness of breath.  Document Released: 03/10/2002 Document Revised: 06/07/2011 Document Reviewed: 06/16/2008 [facility] Patient Information 2012 [place], [place].   RESOURCE GUIDE  Chronic Pain Problems: Contact [facility]  [phone] Patients need to be referred by their primary care doctor.  Insufficient Money for Medicine: Contact United Way:  call "211" or [facility] [phone].  No Primary Care Doctor: - Call [facility]  [phone] - can help you locate a primary care doctor that  accepts your insurance, provides certain services, etc. - Physician Referral Service- [phone]  Agencies that provide inexpensive medical care: - [facility]  [phone] - [facility]  [phone] - [facility]  [phone] - Women's Clinic  [phone] - Planned Parenthood  [phone] [facility]  [phone]  Medicaid-accepting [facility] Providers: - [facility]- [address]  [phone], Mon-Fri 9am-7pm, Sat 9am-1pm - [facility]- [address] [place], [address] [place]  [phone] - [facility]- [address] [place]  [phone] [facility]- [address]  [phone] - Renaye Rakers- [address] [place], [address], [phone]  Only accepts [place] patients after they have their name  applied to their card  Self Pay (no insurance) in [place]: - Sickle Cell Patients: Dr Willey Blade, [facility]  [address] [place], [phone] - [facility]- [address] [place]  [phone]       [facility] [place]- [address]       -     [facility]- see information above  (Speak to [employer] if you do not have insurance)       -  [facility]- [address] [place], [phone]       -  [facility]- [address],  [phone]       -  [facility]- [address], [phone]       -  Dr Julio Sicks-  [address], [place], [phone]       -  [facility]- [address], [phone]       -  [facility]- [address] [phone], also [address], [phone]       -    [facility]- [address] [place], [phone], 1st & 3rd Saturday   every month, 10am-1pm  1) Find a Doctor and Pay Out of Pocket Although you won't have to find out who is covered by your insurance plan, it is a good idea to ask around and get recommendations. You will then need to call the office and see  if the doctor you have chosen will accept you as a new patient and what types of options they offer for patients who are self-pay. Some doctors offer discounts or will set up payment plans for their patients who do not have insurance, but you will need to ask so you aren't surprised when you get to your appointment.  2) Contact Your Local Health Department Not all health departments have doctors that can see patients for sick visits, but many do, so it is worth a call to see if yours does. If you don't know where your local health department is, you can check in your phone book. The CDC also has a tool to help you locate your state's health department, and many state websites also have listings of all of their local health departments.  3) Find a Walk-in Clinic If your illness is not likely to be very severe or complicated, you may want to try a walk in clinic. These are popping up all over the country in pharmacies, drugstores, and shopping centers. They're usually staffed by nurse practitioners or physician assistants that have been trained to treat common illnesses and complaints. They're usually fairly quick and  inexpensive. However, if you have serious medical issues or chronic medical problems, these are probably not your best option  STD Testing - [facility] Department of [facility] [place], STD Clinic, [address] [place], phone [phone] or [phone].  Monday - Friday, call for an appointment. [facility] Department of [employer], STD Clinic, [address], [place], phone [phone] or [phone].  Monday - Friday, call for an appointment.  Abuse/Neglect: [facility] Child Abuse Hotline 949-714-6304 [facility] Child Abuse Hotline 214-332-9961 (After Hours)  Emergency Shelter:  [facility] (631)247-1802  Maternity Homes: - [facility] [place] [facility] [phone] - [facility] (405) 210-6040  MRSA Hotline #:   (814) 359-7212  [facility] Resources  [facility] [place]  United Way [facility] Dept. [address]                 [address]         [address] [place] [address]  Blondell Reveal Phone:  [phone]                                  Phone:  [phone]                   Phone:  4752426111  [facility] Mental Health, [phone] - [facility] - [phone]       -     [facility] in [place], [address]                                  [phone], [place]  [address] Child Abuse Hotline (631) 823-3683 or (702)448-2403 (After Hours)   Behavioral Health Services  Substance Abuse Resources: - [facility] Drug Services  (812)021-4033 - [facility] 5856394254 - [facility] (769)697-5466 Floydene Flock (570) 625-4580 - Residential & Outpatient Substance Abuse Program  [phone]  Psychological Services: [facility]  [phone] [facility]  Services  [phone] - [facility], [phone] [place]. [address] [place], ACCESS LINE: (682)[phone] or (386) [phone], [employer][email or web address]  Dental Assistance  If unable to pay or uninsured, contact:  [facility] or [facility]. to become qualified for the adult dental clinic.  Patients with Medicaid: [facility] [phone] [address], [phone] [address] [phone]  If unable to pay, or uninsured, contact [facility] (787)801-7279) or [facility] Department [phone] in [place], [phone] in [facility]) to become qualified for the adult dental clinic  Other Low-Cost Community Dental Services: - [facility]- [address] [place], [place], [place], [address], [phone], Ext. [address], 2nd and 4th Thursday of the month at 6:30am.  10 clients each day by appointment, can sometimes see walk-in patients if someone does not show for an appointment. [facility]- [address] [place], [place], [address], [phone] - [facility]- [address], [place], [place], [address], [phone] - [facility]- [phone] [facility] Health Department- [phone] [facility] Department- [phone]

## 2011-12-08 NOTE — ED Provider Notes (Signed)
Medical screening examination/treatment/procedure(s) were performed by non-physician practitioner and as supervising physician I was immediately available for consultation/collaboration.   Lyanne Co, MD 12/08/11 [phone]

## 2011-12-18 ENCOUNTER — Encounter (HOSPITAL_COMMUNITY): Payer: Self-pay | Admitting: Physical Medicine and Rehabilitation

## 2011-12-18 ENCOUNTER — Emergency Department (HOSPITAL_COMMUNITY)
Admission: EM | Admit: 2011-12-18 | Discharge: 2011-12-19 | Disposition: A | Discharge status: Home / Self Care | Payer: Self-pay | Attending: Emergency Medicine | Admitting: Emergency Medicine

## 2011-12-18 DIAGNOSIS — F329 Major depressive disorder, single episode, unspecified: Secondary | ICD-10-CM | POA: Insufficient documentation

## 2011-12-18 DIAGNOSIS — F3289 Other specified depressive episodes: Secondary | ICD-10-CM | POA: Insufficient documentation

## 2011-12-18 DIAGNOSIS — F419 Anxiety disorder, unspecified: Secondary | ICD-10-CM

## 2011-12-18 DIAGNOSIS — R002 Palpitations: Secondary | ICD-10-CM | POA: Insufficient documentation

## 2011-12-18 DIAGNOSIS — Z21 Asymptomatic human immunodeficiency virus [HIV] infection status: Secondary | ICD-10-CM | POA: Insufficient documentation

## 2011-12-18 DIAGNOSIS — F410 Panic disorder [episodic paroxysmal anxiety] without agoraphobia: Secondary | ICD-10-CM

## 2011-12-18 DIAGNOSIS — F17200 Nicotine dependence, unspecified, uncomplicated: Secondary | ICD-10-CM | POA: Insufficient documentation

## 2011-12-18 MED ORDER — HYDROMORPHONE HCL PF 1 MG/ML IJ SOLN: 1.0000 mg | Freq: Once | INTRAMUSCULAR | Status: DC

## 2011-12-18 MED ORDER — LORAZEPAM 1 MG PO TABS
1.0000 mg | ORAL_TABLET | Freq: Once | ORAL | Status: AC
  Administered 2011-12-18: 1 mg via ORAL
  Filled 2011-12-18: qty 1

## 2011-12-18 MED ORDER — LORAZEPAM 1 MG PO TABS: 1.0000 mg | ORAL_TABLET | Freq: Three times a day (TID) | ORAL | Status: AC

## 2011-12-18 NOTE — Discharge Instructions (Signed)

## 2011-12-18 NOTE — ED Provider Notes (Signed)
Patient reports having had a panic attack today. Feels much improved his treatment with Ativan. Patient is alert appropriate Glasgow Coma Score 15  Doug Sou, MD 12/18/11 2345

## 2011-12-18 NOTE — ED Notes (Addendum)
Pt presents to department for evaluation of panic attack. Pt states chest pain, dizziness and bilateral arm and leg numbness. Pt is conscious alert and oriented x4. Pt states frequent panic attacks recently. Respirations labored, but pt able to speak complete sentences. No signs of distress upon arrival to ED.

## 2011-12-18 NOTE — ED Notes (Signed)
Pt to ED c/o sharp sternal chest pain, numb arms bil, sob and "seeing spots".  Pt states he has a hx of panic and this feels just like one.  VS stable on monitor.  Pt states that his pain/sob has decreased since he has been sitting in the waiting room for so long.  No longer feels arm numbness and no longer is seeing spots.

## 2011-12-18 NOTE — ED Provider Notes (Signed)
History     CSN: [id number]  Arrival date & time 12/18/11  1607   First MD Initiated Contact with Patient 12/18/11 2214      Chief Complaint  Patient presents with  . Panic Attack     Patient is a 36 y.o. male presenting with anxiety. The history is provided by the patient.  Anxiety This is a recurrent problem. The current episode started today. The problem occurs constantly. The problem has been unchanged. Associated symptoms include diaphoresis. Pertinent negatives include no abdominal pain, anorexia, chest pain, chills, coughing, fatigue, fever, headaches, nausea, neck pain, numbness, rash, vomiting or weakness. The symptoms are aggravated by stress, walking and exertion. He has tried rest for the symptoms. The treatment provided mild relief.    Past Medical History  Diagnosis Date  . Anxiety   . Depression   . Sciatica of left side   . HIV infection     followed by Dr. Luciana Axe- sees him every 4 months    No past surgical history on file.  Family History  Problem Relation Age of Onset  . Mental illness Neg Hx   . Hypertension Mother   . Diabetes Mother   . Stroke Mother     cerbral aneurysm  . Hypertension Brother     History  Substance Use Topics  . Smoking status: Current Everyday Smoker -- 0.5 packs/day for 15 years    Types: Cigarettes  . Smokeless tobacco: Never Used  . Alcohol Use: Yes     every 2 weeks hard liquor      Review of Systems  Constitutional: Positive for diaphoresis. Negative for fever, chills, activity change, appetite change and fatigue.  HENT: Negative for neck pain.   Respiratory: Positive for shortness of breath. Negative for cough, chest tightness and wheezing.   Cardiovascular: Positive for palpitations. Negative for chest pain and leg swelling.  Gastrointestinal: Negative for nausea, vomiting, abdominal pain, diarrhea, constipation and anorexia.  Skin: Negative for rash and wound.  Neurological: Positive for light-headedness.  Negative for dizziness, seizures, syncope, weakness, numbness and headaches.  Psychiatric/Behavioral: Negative for suicidal ideas. The patient is nervous/anxious.   All other systems reviewed and are negative.    Allergies  Bactrim; Other; and Sertraline hcl  Home Medications   Current Outpatient Rx  Name Route Sig Dispense Refill  . DARUNAVIR ETHANOLATE 800 MG PO TABS Oral Take 1 tablet (800 mg total) by mouth daily. 30 tablet 11  . EMTRICITABINE-TENOFOVIR 200-300 MG PO TABS Oral Take 1 tablet by mouth daily. 30 tablet 11  . RITONAVIR 100 MG PO TABS Oral Take 1 tablet (100 mg total) by mouth daily. 30 tablet 11    BP 145/97  Pulse 66  Temp 98.8 F (37.1 C) (Oral)  Resp 16  SpO2 100%  Physical Exam  Nursing note and vitals reviewed. Constitutional: He appears well-developed and well-nourished.  HENT:  Head: Normocephalic and atraumatic.  Right Ear: External ear normal.  Left Ear: External ear normal.  Nose: Nose normal.  Mouth/Throat: Oropharynx is clear and moist. No oropharyngeal exudate.  Eyes: Conjunctivae are normal. Pupils are equal, round, and reactive to light.  Neck: Normal range of motion. Neck supple.  Cardiovascular: Normal rate, regular rhythm, normal heart sounds and intact distal pulses.   Pulmonary/Chest: Effort normal and breath sounds normal. No respiratory distress. He has no wheezes. He has no rales. He exhibits no tenderness.  Abdominal: Soft. Bowel sounds are normal. He exhibits no distension and no mass. There is  no tenderness. There is no rebound and no guarding.  Musculoskeletal: Normal range of motion. He exhibits no edema.  Neurological: He is alert. He displays normal reflexes. No cranial nerve deficit. He exhibits normal muscle tone. Coordination normal.  Skin: Skin is warm and dry. No rash noted. No erythema. No pallor.  Psychiatric: He has a normal mood and affect. His behavior is normal. Judgment and thought content normal.    ED Course    Procedures (including critical care time)  Labs Reviewed - No data to display No results found.   1. Anxiety   2. Panic attack      Date: 12/18/2011  Rate: 88 bpm  Rhythm: normal sinus rhythm  QRS Axis: normal  Intervals: normal  ST/T Wave abnormalities: Q waves in septal leads  Conduction Disutrbances:none  Narrative Interpretation: No evidence of acute ischemia  Old EKG Reviewed: Unchanged (07/13/11)     MDM  36 yo M w/hx of anxiety and panic attacks presents after a panic attack. Between stress with work, the heat, and nicotine, patient believes he has been more susceptible to anxiety recently. Episode resolved PTA. EKG not concerning for acute ischemia or arrythmia. One dose of PO ativan given in ED. Patient given short course of PRN Ativan. Pt return precautions, including worsening of signs or symptoms. Patient instructed to follow-up with primary care physician.          Clemetine Marker, MD 12/19/11 [phone]

## 2011-12-18 NOTE — ED Notes (Signed)
AIDET performed. 

## 2011-12-19 NOTE — ED Notes (Signed)
Pt denies any pain, sob, tingling.  States he feels calm and is requesting food.

## 2011-12-19 NOTE — ED Provider Notes (Signed)
I have personally seen and examined the patient.  I have discussed the plan of care with the resident.  I have reviewed the documentation on PMH/FH/Soc. History.  I have reviewed the documentation of the resident and agree.  Doug Sou, MD 12/19/11 [phone]

## 2011-12-20 ENCOUNTER — Encounter: Payer: Self-pay | Admitting: Family Medicine

## 2011-12-20 ENCOUNTER — Telehealth: Payer: Self-pay | Admitting: Family Medicine

## 2011-12-20 ENCOUNTER — Ambulatory Visit (INDEPENDENT_AMBULATORY_CARE_PROVIDER_SITE_OTHER): Payer: Self-pay | Admitting: Family Medicine

## 2011-12-20 VITALS — BP 156/107 | HR 66 | Ht 72.0 in | Wt 191.3 lb

## 2011-12-20 DIAGNOSIS — M549 Dorsalgia, unspecified: Secondary | ICD-10-CM

## 2011-12-20 DIAGNOSIS — F3289 Other specified depressive episodes: Secondary | ICD-10-CM

## 2011-12-20 DIAGNOSIS — F329 Major depressive disorder, single episode, unspecified: Secondary | ICD-10-CM

## 2011-12-20 DIAGNOSIS — F32A Depression, unspecified: Secondary | ICD-10-CM

## 2011-12-20 DIAGNOSIS — F419 Anxiety disorder, unspecified: Secondary | ICD-10-CM

## 2011-12-20 DIAGNOSIS — F411 Generalized anxiety disorder: Secondary | ICD-10-CM

## 2011-12-20 MED ORDER — CYCLOBENZAPRINE HCL 10 MG PO TABS: 10.0000 mg | ORAL_TABLET | Freq: Three times a day (TID) | ORAL | Status: AC | PRN

## 2011-12-20 MED ORDER — MELOXICAM 15 MG PO TABS: 15.0000 mg | ORAL_TABLET | Freq: Every day | ORAL | Status: DC

## 2011-12-20 MED ORDER — CITALOPRAM HYDROBROMIDE 20 MG PO TABS: 20.0000 mg | ORAL_TABLET | Freq: Every day | ORAL | Status: DC

## 2011-12-20 NOTE — Telephone Encounter (Signed)
Fwd. To Dr.Caviness .Dani Wallner  

## 2011-12-20 NOTE — Patient Instructions (Addendum)
Take mobic daily. Take flexeril at bedtime.  We will call you with your physical therapy appointment.  Return in 2 weeks for recheck.  Back Exercises Back exercises help treat and prevent back injuries. The goal of back exercises is to increase the strength of your abdominal and back muscles and the flexibility of your back. These exercises should be started when you no longer have back pain. Back exercises include:  Pelvic Tilt. Lie on your back with your knees bent. Tilt your pelvis until the lower part of your back is against the floor. Hold this position 5 to 10 sec and repeat 5 to 10 times.   Knee to Chest. Pull first 1 knee up against your chest and hold for 20 to 30 seconds, repeat this with the other knee, and then both knees. This may be done with the other leg straight or bent, whichever feels better.   Sit-Ups or Curl-Ups. Bend your knees 90 degrees. Start with tilting your pelvis, and do a partial, slow sit-up, lifting your trunk only 30 to 45 degrees off the floor. Take at least 2 to 3 seconds for each sit-up. Do not do sit-ups with your knees out straight. If partial sit-ups are difficult, simply do the above but with only tightening your abdominal muscles and holding it as directed.   Hip-Lift. Lie on your back with your knees flexed 90 degrees. Push down with your feet and shoulders as you raise your hips a couple inches off the floor; hold for 10 seconds, repeat 5 to 10 times.   Back arches. Lie on your stomach, propping yourself up on bent elbows. Slowly press on your hands, causing an arch in your low back. Repeat 3 to 5 times. Any initial stiffness and discomfort should lessen with repetition over time.   Shoulder-Lifts. Lie face down with arms beside your body. Keep hips and torso pressed to floor as you slowly lift your head and shoulders off the floor.  Do not overdo your exercises, especially in the beginning. Exercises may cause you some mild back discomfort which lasts for  a few minutes; however, if the pain is more severe, or lasts for more than 15 minutes, do not continue exercises until you see your caregiver. Improvement with exercise therapy for back problems is slow.  See your caregivers for assistance with developing a proper back exercise program. Document Released: 07/26/2004 Document Revised: 06/07/2011 Document Reviewed: 06/18/2005 [facility] Patient Information 2012 [place], [place].

## 2011-12-20 NOTE — Telephone Encounter (Signed)
Needs a note stating what capacity that he can work - with anxiety attacks and all  Needs to pick up today

## 2011-12-20 NOTE — Progress Notes (Signed)
  Subjective:    Patient ID: Phillip Gill, male    DOB: July 29, 1975, 36 y.o.   MRN: [id number]  HPI Neck and upper back pain: X2 weeks. Pain very severe especially along borders of scapula. Very tender to touch. Nothing seems to make it better. Movement makes it worse. Does not remember any trauma to back. Works at [employer] currently on [employer] for previous [employer]. issue-lower back pain. Has been seeing an Worker's Comp. provider for lower back pain problem. Did not go back to this doctor since he states he doesn't think he can relate this to any injury that occurred at work. No problems with bowel or bladder. No numbness or tingling in hands. No fever. No weakness.  Anxiety/depression: Discussed at last appointment. PHQ-9 obtained- see nursing assessment form. Patient states that he has thought about his depression more. He knows that previously he refused any medication for these issues. But since his anxiety is now worse and he feels his depression is not improving he would like to try a medication. Has been tried on Zoloft in the past and had some diarrhea associated with this. Would like to try another medication. No SI. No HI. Patient seen recently for panic attack in the MRSA department and given Ativan. Has not yet filled this prescription.  Smoking status reviewed.   Review of Systems As per above.    Objective:   Physical Exam  Constitutional: He appears well-developed and well-nourished.  HENT:  Head: Normocephalic.  Cardiovascular: Normal rate, regular rhythm and normal heart sounds.   No murmur heard. Pulmonary/Chest: Effort normal. No respiratory distress. He has no wheezes. He has no rales.  Musculoskeletal: He exhibits no edema.       Back exam: Normal rom- some pain with rom. Normal rom of neck.  Normal sensation in extremities bilateral.  Reflexes equal bilateral.  + tenderness with palpation of entire upper back, scapula and posterior neck.       Neurological: He is alert.  Skin: No rash noted.  Psychiatric: He has a normal mood and affect.          Assessment & Plan:

## 2011-12-21 DIAGNOSIS — M549 Dorsalgia, unspecified: Secondary | ICD-10-CM | POA: Insufficient documentation

## 2011-12-21 NOTE — Assessment & Plan Note (Signed)
Consistent with MSK injury/inflammation of the muscles in upper back.  No red flags on exam or in history.  Pt to take mobic daily x 1 week.  And flexeril prn for back pain.  Pt to do home back exercises.  And I will order formal PT consult to work with patient on his back pain.

## 2011-12-21 NOTE — Assessment & Plan Note (Signed)
Pt has diagnosis of anxiety and depression- pt now requesting medication to help with this problem.  Will try celexa daily x 2 weeks.  Discussed with patient that this may cause a mild increase in "jitteriness" and that he should take the ativan prn as prescribed by ER for the next week.  Do not plan on prescribing ativan longterm.  Pt to continue to meet with therapist.  Pt to return for recheck in 2 weeks.

## 2011-12-21 NOTE — Assessment & Plan Note (Signed)
Treating anxiety and depression with celexa- see above.

## 2011-12-21 NOTE — Telephone Encounter (Signed)
I am out of the office right now.  If you get this message before closing today (friday) You can write a note for him that he can return on Monday.  Please remind patient that it is important that he return to work as soon as possible-- working a job and routines sometimes help with the anxiety.  If having significant problems may need to come in sooner to see me - instead of waiting 2 weeks.

## 2011-12-24 ENCOUNTER — Encounter: Payer: Self-pay | Admitting: Family Medicine

## 2011-12-24 ENCOUNTER — Telehealth: Payer: Self-pay | Admitting: *Deleted

## 2011-12-24 NOTE — Telephone Encounter (Signed)
I just wrote a note that states that patient can return to work.  Based on my exam on 12/20/11- patient should be able to work now.  If he is having new or worsening of symptoms regarding back or anxiety pt should return asap for recheck.  Pt is supposed to have f/up with me next week at the latest regarding anxiety.

## 2011-12-24 NOTE — Telephone Encounter (Signed)
Pt called back and request a letter from [facility] that states, that he can not work in the heat. It brings on his anxiety. He did not go to work today, but would go tomorrow, if he receives the note. He needs the note in order to go back to work. Fwd. To Dr.Caviness to address.  I will call the pt, once I have the note. Lorenda Hatchet, Renato Battles

## 2011-12-24 NOTE — Telephone Encounter (Signed)
Called pt. Left message and waiting for call back. Phillip Gill, Phillip Gill

## 2011-12-24 NOTE — Telephone Encounter (Signed)
Call from Dr. Edmonia James regarding patient's CT results, asking if this could be related to his HIV.  Called her back and got voice mail, and left Dr. Ephriam Knuckles pager #.  She wanted to speak with him and if not related to HIV would go ahead and order an MRI. Wendall Mola CMA

## 2011-12-24 NOTE — Telephone Encounter (Signed)
Called pt to pick up the letter. No restriction for work. Phillip Gill, Phillip Gill

## 2011-12-25 NOTE — Telephone Encounter (Signed)
Not necessarily related.  I agree with MRI.

## 2011-12-27 ENCOUNTER — Ambulatory Visit: Payer: Self-pay | Admitting: Family Medicine

## 2011-12-28 ENCOUNTER — Encounter: Payer: Self-pay | Admitting: Family Medicine

## 2011-12-28 ENCOUNTER — Ambulatory Visit (INDEPENDENT_AMBULATORY_CARE_PROVIDER_SITE_OTHER): Payer: Self-pay | Admitting: Family Medicine

## 2011-12-28 VITALS — BP 158/96 | HR 84 | Ht 72.0 in | Wt 188.0 lb

## 2011-12-28 DIAGNOSIS — M549 Dorsalgia, unspecified: Secondary | ICD-10-CM

## 2011-12-28 DIAGNOSIS — R42 Dizziness and giddiness: Secondary | ICD-10-CM

## 2011-12-28 DIAGNOSIS — F411 Generalized anxiety disorder: Secondary | ICD-10-CM

## 2011-12-28 DIAGNOSIS — F419 Anxiety disorder, unspecified: Secondary | ICD-10-CM

## 2011-12-28 DIAGNOSIS — I10 Essential (primary) hypertension: Secondary | ICD-10-CM

## 2011-12-28 DIAGNOSIS — M542 Cervicalgia: Secondary | ICD-10-CM

## 2011-12-28 NOTE — Patient Instructions (Addendum)
Back Pain: Do below exercises to help rehab the painful areas. Purchase muscle relaxer- flexeril that I prescribed at last appointment to use at night.  If you can't afford mobic (meloxicam)- you can use Ibuprofen 600mg  by mouth 3 x per day.    Anxiety and depression: Very important that you meet with therapist.  Start celexa daily.  Blood pressure: I would like to recheck this in 2 weeks when hopefully your back pain and anxiety will be better controlled.  If still elevated we may need to talk about starting a blood pressure medicine.   For heat problems: You need to take frequent breaks, drink water frequently Talk with your therapist about ways to manage your anxiety when you are working in the heat.     Back Exercises These exercises may help you when beginning to rehabilitate your injury. Your symptoms may resolve with or without further involvement from your physician, physical therapist or athletic trainer. While completing these exercises, remember:   Restoring tissue flexibility helps normal motion to return to the joints. This allows healthier, less painful movement and activity.   An effective stretch should be held for at least 30 seconds.   A stretch should never be painful. You should only feel a gentle lengthening or release in the stretched tissue.  STRETCH - Extension, Prone on Elbows   Lie on your stomach on the floor, a bed will be too soft. Place your palms about shoulder width apart and at the height of your head.   Place your elbows under your shoulders. If this is too painful, stack pillows under your chest.   Allow your body to relax so that your hips drop lower and make contact more completely with the floor.   Hold this position for __________ seconds.   Slowly return to lying flat on the floor.  Repeat __________ times. Complete this exercise __________ times per day.  RANGE OF MOTION - Extension, Prone Press Ups   Lie on your stomach on the floor, a  bed will be too soft. Place your palms about shoulder width apart and at the height of your head.   Keeping your back as relaxed as possible, slowly straighten your elbows while keeping your hips on the floor. You may adjust the placement of your hands to maximize your comfort. As you gain motion, your hands will come more underneath your shoulders.   Hold this position __________ seconds.   Slowly return to lying flat on the floor.  Repeat __________ times. Complete this exercise __________ times per day.  RANGE OF MOTION- Quadruped, Neutral Spine   Assume a hands and knees position on a firm surface. Keep your hands under your shoulders and your knees under your hips. You may place padding under your knees for comfort.   Drop your head and point your tail bone toward the ground below you. This will round out your low back like an angry cat. Hold this position for __________ seconds.   Slowly lift your head and release your tail bone so that your back sags into a large arch, like an old horse.   Hold this position for __________ seconds.   Repeat this until you feel limber in your low back.   Now, find your "sweet spot." This will be the most comfortable position somewhere between the two previous positions. This is your neutral spine. Once you have found this position, tense your stomach muscles to support your low back.   Hold this position for  __________ seconds.  Repeat __________ times. Complete this exercise __________ times per day.  STRETCH - Flexion, Single Knee to Chest   Lie on a firm bed or floor with both legs extended in front of you.   Keeping one leg in contact with the floor, bring your opposite knee to your chest. Hold your leg in place by either grabbing behind your thigh or at your knee.   Pull until you feel a gentle stretch in your low back. Hold __________ seconds.   Slowly release your grasp and repeat the exercise with the opposite side.  Repeat __________  times. Complete this exercise __________ times per day.  STRETCH - Hamstrings, Standing  Stand or sit and extend your right / left leg, placing your foot on a chair or foot stool   Keeping a slight arch in your low back and your hips straight forward.   Lead with your chest and lean forward at the waist until you feel a gentle stretch in the back of your right / left knee or thigh. (When done correctly, this exercise requires leaning only a small distance.)   Hold this position for __________ seconds.  Repeat __________ times. Complete this stretch __________ times per day. STRENGTHENING - Deep Abdominals, Pelvic Tilt   Lie on a firm bed or floor. Keeping your legs in front of you, bend your knees so they are both pointed toward the ceiling and your feet are flat on the floor.   Tense your lower abdominal muscles to press your low back into the floor. This motion will rotate your pelvis so that your tail bone is scooping upwards rather than pointing at your feet or into the floor.   With a gentle tension and even breathing, hold this position for __________ seconds.  Repeat __________ times. Complete this exercise __________ times per day.  STRENGTHENING - Abdominals, Crunches   Lie on a firm bed or floor. Keeping your legs in front of you, bend your knees so they are both pointed toward the ceiling and your feet are flat on the floor. Cross your arms over your chest.   Slightly tip your chin down without bending your neck.   Tense your abdominals and slowly lift your trunk high enough to just clear your shoulder blades. Lifting higher can put excessive stress on the low back and does not further strengthen your abdominal muscles.   Control your return to the starting position.  Repeat __________ times. Complete this exercise __________ times per day.  STRENGTHENING - Quadruped, Opposite UE/LE Lift   Assume a hands and knees position on a firm surface. Keep your hands under your  shoulders and your knees under your hips. You may place padding under your knees for comfort.   Find your neutral spine and gently tense your abdominal muscles so that you can maintain this position. Your shoulders and hips should form a rectangle that is parallel with the floor and is not twisted.   Keeping your trunk steady, lift your right hand no higher than your shoulder and then your left leg no higher than your hip. Make sure you are not holding your breath. Hold this position __________ seconds.   Continuing to keep your abdominal muscles tense and your back steady, slowly return to your starting position. Repeat with the opposite arm and leg.  Repeat __________ times. Complete this exercise __________ times per day. Document Released: 07/06/2005 Document Revised: 06/07/2011 Document Reviewed: 09/30/2008 [employer] Patient Information 2012 [place], [place].

## 2011-12-28 NOTE — Progress Notes (Signed)
  Subjective:    Patient ID: Phillip Gill, male    DOB: 02/06/1976, 36 y.o.   MRN: [id number]  HPI Dizziness that work: Patient states that he works in [employer] at [employer]. 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 States that sometimes he gets hot and feels dizzy. He does not always have access to water while at work. He is dealing with things I disorder and sometimes it is worsened when he is outside in the heat. No syncope. No headache. No weakness. No vision changes.  Anxiety/depression: Patient states his anxiety is similar to what it was at last exam. States he has not purchased the Celexa since he has not been working he does not have the money. He also did not purchase the Ativan prescribed by the ER. Patient has not seen his therapist-but has call for an appointment.   Blood pressure: Patient blood pressure 158/96. Patient is not taking any blood pressure medications. Patient does not have a history of hypertension diagnosis.  Back pain: Patient diagnosed with muscle skeletal pain and upper back at last exam. Being followed by Workmen's Comp. M.D. For lower back pain. Patient has not done any home exercises-states he did not receive any last time. Has not been exercising. Has not purchased prescriptions of Mobic or Flexeril-do to cost problems. Patient states that he has had some minimal improvement. But still having some pain in upper back with palpation of muscles or moving in certain ways.  Smoking status reviewed.    Review of Systems As per above    Objective:   Physical Exam  Constitutional: He appears well-developed and well-nourished.  Cardiovascular: Normal rate, regular rhythm and normal heart sounds.   No murmur heard. Pulmonary/Chest: Effort normal and breath sounds normal. No respiratory distress. He has no wheezes. He has no rales.  Abdominal: Soft. He exhibits no distension. There is no tenderness.    Musculoskeletal: He exhibits no edema.       Normal rom. Normal gait.  +diffuse tenderness on palpation of entire upper back, neck and lower back.  Normal reflexes. Normal strength and Normal sensation in lower extremities.   Neurological: He is alert. He has normal reflexes. He displays normal reflexes. No cranial nerve deficit. He exhibits normal muscle tone. Coordination normal.  Psychiatric: He has a normal mood and affect.          Assessment & Plan:

## 2011-12-29 DIAGNOSIS — R42 Dizziness and giddiness: Secondary | ICD-10-CM | POA: Insufficient documentation

## 2011-12-29 NOTE — Assessment & Plan Note (Addendum)
Dizziness most likely due to not drinking enough water when being outside in the heat.  Nuero exam within normal limits.  Symptoms most likely worsened by underlying anxiety disorder.  Wrote note for patient to take to work requesting frequent water breaks and if at all possible shade while sitting outside at back entrance of [employer] to decrease heat exposure.  Pt to discuss with therapist ways to work through anxiety that are related to his work.  Encouraged patient to attend work.

## 2011-12-29 NOTE — Assessment & Plan Note (Addendum)
Pt has not followed through on plan discussed last time.  Pt to do home rehab exercises.  Ibuprofen and flexeril prn. Return for f/up in 2 weeks.  Incidental finding on CT scan of neck done recently in ER- most likely unrelated to pt pain.-- CT read:"Heterogeneous density to the vertebral bodies without cortical breakthrough. This is nonspecific however consider an MRI follow-up to exclude myeloproliferative disorders."  Called Dr. Luciana Axe- pt's ID MD to discuss and to see if this is a common finding with a pt with HIV or on HIV medications.  He states that he is not sure either of the cause of these changes on CT.  Will move forward with MRI of cervical spine.

## 2011-12-29 NOTE — Assessment & Plan Note (Signed)
Encouraged pt. To start celexa as prescribed.  Ativan prn for the first 2 weeks of treatment.  Pt to fup with therapist asap- call again today for an appointment.

## 2011-12-29 NOTE — Assessment & Plan Note (Signed)
bp elevated at 158/96 today- may be worsened by patient's anxiety and back pain.  Will recheck bp at next appointment and if it remains elevated will consider adding antihypertensive.  Pt to monitor bp at home.

## 2011-12-31 ENCOUNTER — Telehealth: Payer: Self-pay | Admitting: *Deleted

## 2011-12-31 NOTE — Telephone Encounter (Signed)
Called pt. Waiting for call back. Please tell pt: APPT AT [facility] on Fri 01/04/12  Arrive at 10:45 am for MRI .Phillip Gill

## 2011-12-31 NOTE — Telephone Encounter (Signed)
Message copied by Arlyss Repress on Mon Dec 31, 2011  8:47 AM ------      Message from: [place], [place] M      Created: Fri Dec 28, 2011  4:05 PM       Just placed mri order on this patient, please arrange.  Routine, not urgent.  dawn

## 2012-01-02 NOTE — Telephone Encounter (Signed)
Called pt again and left message to call back. See previous message. (MRI appt) .Arlyss Repress

## 2012-01-04 ENCOUNTER — Ambulatory Visit (HOSPITAL_COMMUNITY)
Admission: RE | Admit: 2012-01-04 | Discharge: 2012-01-04 | Disposition: A | Discharge status: Home / Self Care | Payer: Medicaid Other | Source: Ambulatory Visit | Attending: Family Medicine | Admitting: Family Medicine

## 2012-01-04 DIAGNOSIS — M542 Cervicalgia: Secondary | ICD-10-CM | POA: Insufficient documentation

## 2012-01-04 DIAGNOSIS — M47812 Spondylosis without myelopathy or radiculopathy, cervical region: Secondary | ICD-10-CM | POA: Insufficient documentation

## 2012-01-04 NOTE — Telephone Encounter (Signed)
Called again.re: MRI Pt did not call back. Lorenda Hatchet, Renato Battles

## 2012-01-07 NOTE — Telephone Encounter (Signed)
fyi from Briyanna Billingham 

## 2012-01-08 ENCOUNTER — Ambulatory Visit: Payer: Self-pay | Admitting: Family Medicine

## 2012-01-10 ENCOUNTER — Telehealth: Payer: Self-pay | Admitting: Family Medicine

## 2012-01-10 NOTE — Telephone Encounter (Signed)
Called patient to discuss MRI findings.  Pt states understanding.  Will discuss in more detail at next f/up appointment.

## 2012-01-11 ENCOUNTER — Encounter: Payer: Self-pay | Admitting: Family Medicine

## 2012-01-11 ENCOUNTER — Ambulatory Visit (INDEPENDENT_AMBULATORY_CARE_PROVIDER_SITE_OTHER): Payer: Medicaid Other | Admitting: Family Medicine

## 2012-01-11 VITALS — BP 140/90 | HR 80 | Ht 72.0 in | Wt 196.6 lb

## 2012-01-11 DIAGNOSIS — M549 Dorsalgia, unspecified: Secondary | ICD-10-CM

## 2012-01-11 DIAGNOSIS — F411 Generalized anxiety disorder: Secondary | ICD-10-CM

## 2012-01-11 DIAGNOSIS — F419 Anxiety disorder, unspecified: Secondary | ICD-10-CM

## 2012-01-11 NOTE — Progress Notes (Signed)
  Subjective:    Patient ID: Phillip Gill, male    DOB: Apr 01, 1976, 36 y.o.   MRN: [id number]  HPI MRI of neck results: Patient would like to discuss MRI results in more detail. And would like partner to another results. Reviewed these in detail again. Read the report to patient and partner. Answered all questions.  Anxiety: Patient states that anxiety is improved. He states that he does not want to start medication at this time. He has not made an appointment with his therapist for followup. He states he has been very busy and has not had time to do this. Some days he still has some anxiety.  Lower back pain: Lower back pain off and on times a couple of months. Pain is worse with standing or prolonged sitting. Improved with leaning against a wall while standing or sitting in a supportive care with her back. Worse when he is working at [employer]. Since he has to stand for 8 hours. Pain sometimes goes down the back of the legs states- unable to describe the pain that he feels in his legs. No shooting down the back of his legs. No pain in the buttocks. Patient states that he would like a note stating that he cannot do his job at work due to his pain. No fever. No bowel or bladder problems. No weakness in extremities. Patient has been doing home exercises given at last appointment only 2-3 times per week. States that the Flexeril did not help. Patient states that  meloxicam did not help. Has been seen for this issue with Worker's Comp. in the past. He has been evaluated and released back to work. Workmen's Comp. has put him back to full duty standing x8 hours. He would like another evaluation of back. Stating that he is continuing to have pain. Patient is aware that I cannot file Workmen's Comp. claims and that he will be responsible for all charges for today's visit. Patient states he is here for second opinion and no longer wants to have care for this issue under Workmen's Comp.  Smoking status  reviewed.   Review of Systems As per above.    Objective:   Physical Exam  Constitutional: He appears well-developed and well-nourished.  HENT:  Head: Normocephalic and atraumatic.  Cardiovascular: Normal rate, regular rhythm and normal heart sounds.   Pulmonary/Chest: Effort normal. No respiratory distress.  Musculoskeletal:       Back exam: Tenderness with palpation of entire upper back down to mid back level.  + tenderness across entire lower back area.  No tenderness on palpation of spinous process.  Normal reflexes.  Normal strength in legs bilateral.  Normal skin. Negative straight leg raise.   Neurological: He is alert.  Skin: No rash noted.  Psychiatric: He has a normal mood and affect.          Assessment & Plan:

## 2012-01-11 NOTE — Assessment & Plan Note (Addendum)
Cause is MSK.  No red flags on exam. No relief with pain with mobic or flexeril- so will no longer prescribe.  Will prescribe ibuprofen 600mg  po TId prn.  Will send pt to PT for evaluation and treatment of back.  Have asked them to please give recommendations regarding needed work modifications for help with back rehab. Pt to return in 2-4 weeks for recheck.   Greater than 50% of face time spent in counseling patient.

## 2012-01-11 NOTE — Patient Instructions (Addendum)
Anxiety- Return for visit when you would like to start medical treatment for anxiety.  I do recommend that you meet with your therapist- call for an appointment.   Lower back pain: I recommend physical therapy for your back pain.  Since mobic and flexeril not helping I will not represcribe.  Take ibuprofen- 600mg  3 x per day as needed for back pain.   Return for recheck in 2-4 weeks.

## 2012-01-11 NOTE — Assessment & Plan Note (Signed)
Pt refusing treatment at this time.  Pt to return when he would like to proceed with treatment.   Encouraged pt to meet with therapist.

## 2012-01-24 ENCOUNTER — Ambulatory Visit: Payer: Self-pay | Admitting: Family Medicine

## 2012-02-01 ENCOUNTER — Ambulatory Visit: Payer: Self-pay | Admitting: Family Medicine

## 2012-02-01 ENCOUNTER — Telehealth: Payer: Self-pay | Admitting: *Deleted

## 2012-02-01 NOTE — Telephone Encounter (Signed)
Patient called requesting referral to an orthopedic for back pain, his work is not referring him.  Advised him to speak with his PCP regarding this.  He states he has an appointment there next week and will talk to his doctor about it then. Wendall Mola CMA

## 2012-02-07 ENCOUNTER — Ambulatory Visit: Payer: Medicaid Other

## 2012-02-08 ENCOUNTER — Ambulatory Visit (INDEPENDENT_AMBULATORY_CARE_PROVIDER_SITE_OTHER): Payer: Medicaid Other | Admitting: Family Medicine

## 2012-02-08 ENCOUNTER — Encounter: Payer: Self-pay | Admitting: Family Medicine

## 2012-02-08 VITALS — BP 136/94 | HR 72 | Temp 98.0°F | Ht 72.0 in | Wt 200.2 lb

## 2012-02-08 DIAGNOSIS — M25519 Pain in unspecified shoulder: Secondary | ICD-10-CM

## 2012-02-08 DIAGNOSIS — M25512 Pain in left shoulder: Secondary | ICD-10-CM

## 2012-02-08 DIAGNOSIS — M542 Cervicalgia: Secondary | ICD-10-CM

## 2012-02-08 DIAGNOSIS — M549 Dorsalgia, unspecified: Secondary | ICD-10-CM

## 2012-02-08 NOTE — Progress Notes (Signed)
  Subjective:    Patient ID: Phillip Gill, male    DOB: September 01, 1975, 36 y.o.   MRN: [id number]  HPI  36 year old male with HIV and chronic neck and back pain who presents for further evaluation of the neck and back pain. This problem has been persistent for several years and has been evaluated by Dr. Edmonia James, his previous PCP numerous times. He also brings it up as a concern at his appointment with the infectious disease doctors and has also presented to the ED for this problem approximately. He was prescribed Mobic and Flexeril by Dr. Edmonia James in the past, but never filled the prescription due to financial problems. Additionally, he has tried valium and tramadol by a previous provider, but this was stopped by Dr. Daiva Eves in May 2013 when he prescribed the patient oxycodone-acetominophen 10-300 #60. An MRI of his lumbar and cervical spine showed mild degenerative changes at L5-S1, mild spondylosis of C2-C3, C6-C7, and degenerative changes at C1-C2.   He presents to clinic today, because his pain is persistent. It is preventing him from performing full duty at work in [employer]. He was being evaluated by a physician chose by [facility] who referred him to an orthopedic surgeon. However, the referral was denied by [employer]. His pain is located in the neck and radiates to his shoulders bilaterally. He also has pain in his lower pain radiating to his buttocks. He denies weakness of the extremities and problems with bowel and bladder.   Review of Systems  All other systems reviewed and are negative.       Objective:   Physical Exam BP 136/94  Pulse 72  Temp 98 F (36.7 C) (Oral)  Ht 6' (1.829 m)  Wt 200 lb 3.2 oz (90.81 kg)  BMI 27.15 kg/m2 WUJ:[id number], oriented, very pleasant, accompanied by his partner MSK:   Cervical Spine: TTP along the cervical paraspinal muscles and trapezius,   right shoulder - poor effort rotator cuff testing due to pain, , normal ROM  left shoulder -  poor effort rotator cuff testing due to pain; significant pain with external rotation with the arm abducted to 90 degrees;   Lumbar spine - tender to palpation along paraspinal muscles; 5/5 strength hip flexion, knee extension and knee flexion; negative straight leg raise test   Neuro: DTR 2+ and symmetric in patella, biceps and brachioradialis      Assessment & Plan:

## 2012-02-08 NOTE — Patient Instructions (Addendum)
Dear Mr. Cravens,   Thank you for coming to clinic today. Please read below regarding the issues that we discussed.   1. Left shoulder pain - This could be the result of strain to the muscles of the shoulder or even adhesive capsulitis or "frozen shoulder." I will make a referral to sports medicine.   2. Back pain - This seems to be from tightening of the muscles around the spine. Please have it addressed at Sports Med as well.   Please follow up in clinic in 8 weeks . Please call earlier if you have any questions or concerns.   Sincerely,   Dr. Clinton Sawyer

## 2012-02-11 DIAGNOSIS — M797 Fibromyalgia: Secondary | ICD-10-CM | POA: Insufficient documentation

## 2012-02-11 NOTE — Assessment & Plan Note (Signed)
No evidence of neurologic cause. This is likely MSK in origin. He has clearly been to numerous providers about the issue, but not a dedicated sports medicine physician. It is unclear how compliant he has been with other recommendations of therapy. I will refer to sports medicine for further evaluation.

## 2012-02-11 NOTE — Assessment & Plan Note (Signed)
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 His reaction to the physical exam maneuvers and poor effort seemed to be out of proportion to the intensity of the exam. His left shoulder does have decreased ROM, which could also represent adhesive capsulitis. I will refer him to Sports Medicine for further evaluation.

## 2012-02-18 ENCOUNTER — Encounter: Payer: Self-pay | Admitting: Sports Medicine

## 2012-02-18 ENCOUNTER — Ambulatory Visit (INDEPENDENT_AMBULATORY_CARE_PROVIDER_SITE_OTHER): Payer: Self-pay | Admitting: Sports Medicine

## 2012-02-18 VITALS — BP 142/100 | HR 79 | Ht 72.0 in | Wt 200.0 lb

## 2012-02-18 DIAGNOSIS — M542 Cervicalgia: Secondary | ICD-10-CM

## 2012-02-18 MED ORDER — MELOXICAM 15 MG PO TABS: 15.0000 mg | ORAL_TABLET | Freq: Every day | ORAL | Status: DC

## 2012-02-18 NOTE — Progress Notes (Signed)
Subjective:    Patient ID: Phillip Gill, male    DOB: [birth year], 37 y.o.   MRN: [id number]  HPI  36 year old male with a h/o HIV on HAART presents for neck and shoulder pain.  Patient reports he he first noticed pain in the cervical spine area in March 2013.  Approximately three weeks later he developed left shoulder pain.  Denies any precipitating injury or event.  Pain in neck is aching and sharp, also feels stiff, L>R.  He endorses pain in the left shoulder, most significant over superior aspect, with radiation down the arm.  Exacerbated by any lifting and overhead activity.  He does occasionally feel numbness down the arm, but has not had any weakness.  He also endorses three weeks of intermittent shooting pains down the right arm without numbness or weakness.    Patient also complains of low back pain since January 2013.  He describes a sharp pain with radiation from the low back down the bilateral legs, along the lateral aspects, to the level of the feet.  He describes numbness on the lateral aspects of the distal legs.  It is exacerbated by prolonged standing, walking, bending, and lifting.  He did do physical therapy for approximately 4 weeks in March of this year, which provided improvement for approximately two weeks before pain returned.  He has tried naproxen and flexeril, however did not provide significant relief.    Of note, patient was started on HAART in April 2013.  Although myalgias and pain can be a significant side effect, his symptoms seemed to precede these medications.  His PCP and his infectious disease doctor, Dr. Luciana Axe, do not feel this is contributing.  Review of Systems  Denies headaches, vision changes, chest pain, shortness of breath.  Reports he has been monitoring his blood pressure with his PCP, no formal diagnosis of HTN.       Objective:   Physical Exam General - well appearing, well nourished, good hygiene, NAD HEENT - ncat, mmm, EOMI, no conjunctival  injection Resp - respirations non-labored Skin - c/d/i, no rashes MS: -  Neck:  No gross deformity noted, tender to palpation over cervical spinous processes C5-C7 and left trapezius muscle.  Full ROM of neck.  Pain with extension of neck.  Strength 5/5.  Spurling's maneuver negative for radicular symptoms but causes pain in paraspinal muscles. -  Left shoulder:  No gross deformity or atrophy noted.  Abduction and flexion to 180 degrees but painful.  Normal external rotation, internal rotation with posterior reach to T10 level.  Strength 5/5.  Gross sensation intact.  Positive Hawkin's and Neer's.  Cross arm abduction causes pain over superior and lateral aspect of shoulder.  Negative Speed's and Yergason's testing. -  Lumbar back:  No gross deformity.  ROM restricted secondary to pain.  Hip flexion, knee flexion, knee extension, foot dorsi and plantarflexion strength 5/5.  Gross sensation intact.  DTRs 2+.  Negative straight leg raise test.    MRI:    Assessment & Plan:  35 yom presents with neck, left shoulder, and low back pain.  1.  Cervical strain.  -  Likely contributing to left shoulder symptoms.   -  Mobic 15mg  daily with food. -  Refer to physical therapy for stretching, strengthening, and modalities.   -  Offered Depo-Medrol injection in office but declined for now. -  Continue light duty at work.  2.  Lumbar disc bulge and associated lumbar strain.  3.  Elevated blood pressure. -  Recommend follow up with PCP  Return to clinic in 4 weeks.

## 2012-02-28 ENCOUNTER — Ambulatory Visit: Payer: Self-pay | Attending: Sports Medicine

## 2012-02-28 DIAGNOSIS — M2560 Stiffness of unspecified joint, not elsewhere classified: Secondary | ICD-10-CM | POA: Insufficient documentation

## 2012-02-28 DIAGNOSIS — M2550 Pain in unspecified joint: Secondary | ICD-10-CM | POA: Insufficient documentation

## 2012-02-28 DIAGNOSIS — R293 Abnormal posture: Secondary | ICD-10-CM | POA: Insufficient documentation

## 2012-02-28 DIAGNOSIS — IMO0001 Reserved for inherently not codable concepts without codable children: Secondary | ICD-10-CM | POA: Insufficient documentation

## 2012-03-04 ENCOUNTER — Ambulatory Visit: Payer: Self-pay | Attending: Sports Medicine

## 2012-03-04 DIAGNOSIS — R293 Abnormal posture: Secondary | ICD-10-CM | POA: Insufficient documentation

## 2012-03-04 DIAGNOSIS — M2550 Pain in unspecified joint: Secondary | ICD-10-CM | POA: Insufficient documentation

## 2012-03-04 DIAGNOSIS — M2560 Stiffness of unspecified joint, not elsewhere classified: Secondary | ICD-10-CM | POA: Insufficient documentation

## 2012-03-04 DIAGNOSIS — IMO0001 Reserved for inherently not codable concepts without codable children: Secondary | ICD-10-CM | POA: Insufficient documentation

## 2012-03-11 ENCOUNTER — Ambulatory Visit: Payer: Self-pay

## 2012-03-13 ENCOUNTER — Ambulatory Visit: Payer: Self-pay

## 2012-03-17 ENCOUNTER — Ambulatory Visit (INDEPENDENT_AMBULATORY_CARE_PROVIDER_SITE_OTHER): Payer: Self-pay | Admitting: Sports Medicine

## 2012-03-17 VITALS — BP 148/90 | Ht 72.0 in | Wt 200.0 lb

## 2012-03-17 DIAGNOSIS — M542 Cervicalgia: Secondary | ICD-10-CM

## 2012-03-17 DIAGNOSIS — M545 Low back pain, unspecified: Secondary | ICD-10-CM

## 2012-03-17 NOTE — Progress Notes (Signed)
  Subjective:    Patient ID: Phillip Gill, male    DOB: 1976/04/30, 36 y.o.   MRN: [id number]  HPI chief complaint: Followup on low back pain and neck pain  Patient comes in today follow up. He is still struggling with diffuse neck and low back pain. Also experiencing pain in each of his arms in each of his legs. He's been unable to fill his Mobic stating that cost him too much. He has been going to physical therapy but has not found it to be beneficial. He works as a Nature conservation officer at [employer], and was recently placed out of work due to his pain. He denies any change in bowel or bladder habits. He does continue to complain of occasional numbness and tingling in both his arms and legs.  MRI scan of his cervical spine done 12/28/2010 showed some degenerative changes at C1-C2. He also has mild spondylosis at C2-C7. No stenosis. MRI of his lumbar spine done on 07/16/2011 showed some mild degenerative facet changes in the lower lumbar segments. No stenosis.    Review of Systems     Objective:   Physical Exam Well-developed, well-nourished. No acute distress.  Physical exam is limited by pain. He has pain even with light touch across the posterior aspect of his cervical spine. Pain with light touch across his low back. No spasm. He has limited cervical and lumbar mobility and I'll planes due to pain. His strength is globally decreased in both upper and lower extremities, but no focal deficit his appreciated. Reflexes are brisk and equal at the biceps triceps brachial radialis patellar and Achilles tendons. He walks without a limp.       Assessment & Plan:  1. Neck pain with MRI evidence of mild degenerative changes 2. Low back pain with MRI evidence of mild facet arthropathy 3. HIV  We discussed the possibility of a 6 day Sterapred Dosepak to the patient's finances will not allow him to fill this medicine. I've explained to him that the degenerative changes seen on his MRI scans are not  significant enough in my opinion to warrant a prolonged absence from work. I will keep him out of work 2 additional weeks. At this point in time I do not think I'm able to offer him much more in the way of treatment. I do not believe he would benefit from epidural steroid injections and progress in physical therapy has been limited. I've asked that he discuss with his PCP the possibility of applying for [facility]. Once that happens, hopefully he can get his Mobic. At this point I will see him only as need.

## 2012-03-18 ENCOUNTER — Other Ambulatory Visit: Payer: Self-pay

## 2012-03-18 DIAGNOSIS — B20 Human immunodeficiency virus [HIV] disease: Secondary | ICD-10-CM

## 2012-03-18 LAB — COMPLETE METABOLIC PANEL WITHOUT GFR
ALT: 20 U/L (ref 0–53)
AST: 22 U/L (ref 0–37)
Albumin: 3.9 g/dL (ref 3.5–5.2)
Alkaline Phosphatase: 90 U/L (ref 39–117)
BUN: 13 mg/dL (ref 6–23)
CO2: 26 meq/L (ref 19–32)
Calcium: 8.8 mg/dL (ref 8.4–10.5)
Chloride: 104 meq/L (ref 96–112)
Creat: 1.16 mg/dL (ref 0.50–1.35)
GFR, Est African American: >89 mL/min
GFR, Est Non African American: 81 mL/min
Glucose, Bld: 147 mg/dL — ABNORMAL HIGH (ref 70–99)
Potassium: 4 meq/L (ref 3.5–5.3)
Sodium: 137 meq/L (ref 135–145)
Total Bilirubin: 0.2 mg/dL — ABNORMAL LOW (ref 0.3–1.2)
Total Protein: 7.5 g/dL (ref 6.0–8.3)

## 2012-03-18 LAB — CBC WITH DIFFERENTIAL/PLATELET
Basophils Absolute: 0 10*3/uL (ref 0.0–0.1)
Eosinophils Relative: 0 % (ref 0–5)
HCT: 39.7 % (ref 39.0–52.0)
Hemoglobin: 13.6 g/dL (ref 13.0–17.0)
Lymphocytes Relative: 41 % (ref 12–46)
Lymphs Abs: 3.1 10*3/uL (ref 0.7–4.0)
MCV: 87.3 fL (ref 78.0–100.0)
Monocytes Absolute: 0.4 10*3/uL (ref 0.1–1.0)
Neutro Abs: 4.1 10*3/uL (ref 1.7–7.7)
RBC: 4.55 MIL/uL (ref 4.22–5.81)
RDW: 13.8 % (ref 11.5–15.5)
WBC: 7.7 10*3/uL (ref 4.0–10.5)

## 2012-03-19 ENCOUNTER — Ambulatory Visit (INDEPENDENT_AMBULATORY_CARE_PROVIDER_SITE_OTHER): Payer: Self-pay | Admitting: Family Medicine

## 2012-03-19 ENCOUNTER — Encounter: Payer: Self-pay | Admitting: Family Medicine

## 2012-03-19 VITALS — BP 145/107 | HR 86 | Temp 99.6°F | Ht 72.0 in | Wt 201.0 lb

## 2012-03-19 DIAGNOSIS — M791 Myalgia, unspecified site: Secondary | ICD-10-CM

## 2012-03-19 DIAGNOSIS — Z23 Encounter for immunization: Secondary | ICD-10-CM

## 2012-03-19 DIAGNOSIS — I10 Essential (primary) hypertension: Secondary | ICD-10-CM

## 2012-03-19 DIAGNOSIS — IMO0001 Reserved for inherently not codable concepts without codable children: Secondary | ICD-10-CM

## 2012-03-19 DIAGNOSIS — M542 Cervicalgia: Secondary | ICD-10-CM

## 2012-03-19 LAB — HIV-1 RNA QUANT-NO REFLEX-BLD: HIV-1 RNA Quant, Log: <1.3 {Log} (ref <1.30)

## 2012-03-19 LAB — T-HELPER CELL (CD4) - (RCID CLINIC ONLY): CD4 % Helper T Cell: 29 % — ABNORMAL LOW (ref 33–55)

## 2012-03-19 NOTE — Patient Instructions (Addendum)
Dear Mr. Peduzzi,   Thank you for coming to clinic today. Please read below regarding the issues that we discussed.   1. I will get two labs to check general inflammation, and then we will consider starting a medication to target your pain.   2. I also want to start a medication for hypertension, and I will call that in for you.   Please follow up in clinic in 4 weeks. Please call earlier if you have any questions or concerns.   Sincerely,   Dr. Clinton Sawyer

## 2012-03-20 ENCOUNTER — Other Ambulatory Visit: Payer: Self-pay

## 2012-03-20 LAB — SEDIMENTATION RATE: Sed Rate: 17 mm/hr — ABNORMAL HIGH (ref 0–16)

## 2012-03-20 LAB — C-REACTIVE PROTEIN: CRP: <0.5 mg/dL (ref <0.60)

## 2012-03-20 MED ORDER — AMITRIPTYLINE HCL 50 MG PO TABS: 50.0000 mg | ORAL_TABLET | Freq: Every day | ORAL | Status: DC

## 2012-03-20 MED ORDER — LISINOPRIL 20 MG PO TABS: 20.0000 mg | ORAL_TABLET | Freq: Every day | ORAL | Status: DC

## 2012-03-21 ENCOUNTER — Encounter: Payer: Self-pay | Admitting: Family Medicine

## 2012-03-21 NOTE — Assessment & Plan Note (Signed)
Given his persistent pain without any trauma and out of proportion to imaging as well as numerous evaluations by physicians including sports medicine to rule out other causes, it was necessary to consider fibromyalgia as the cause. He meets the characteristics of having 12 tender points, and a negative ESR and CRP make a rheumatologic cause highly unlikely. Therefore he will start amitriptyline 50 mg QHS for fibromyalgia.

## 2012-03-21 NOTE — Assessment & Plan Note (Signed)
Started lisinopril 20 mg daily. No reason to suspect secondary cause at this point. Check BMET in 4 weeks.

## 2012-03-21 NOTE — Progress Notes (Signed)
  Subjective:    Patient ID: Phillip Gill, male    DOB: 1975-11-02, 36 y.o.   MRN: [id number]  HPI  36 year old male with HIV and chronic neck and back neck pain who presents for follow up of neck pain. This problem has been persistent for several years and has been evaluated by Dr. Edmonia James, his previous PCP numerous times. He also brings it up as a concern at his appointment with the infectious disease doctors and has also presented to the ED for this problem approximately. He was prescribed Mobic and Flexeril by Dr. Edmonia James in the past, but never filled the prescription due to financial problems. Additionally, he has tried valium and tramadol by a previous provider, but this was stopped by Dr. Daiva Eves in May 2013 when he prescribed the patient oxycodone-acetominophen 10-300 #60. Over the past month, he was evaluated by Dr. Margaretha Sheffield of the sports medicine center, who initially prescribed the patient meloxicam to try to improve the tenderness and swelling in his neck and back muscles. However, the patient could not fill the medication secondary to the cost. During the second encounter with Dr. Margaretha Sheffield, it was noted that there was not much more to offer from a MSK standpoint as his pain and dysfunction did not correlated with radiologic findings. MRI scan of his cervical spine done 12/28/2010 showed some degenerative changes at C1-C2. He also has mild spondylosis at C2-C7. No stenosis. MRI of his lumbar spine done on 07/16/2011 showed some mild degenerative facet changes in the lower lumbar segments. No stenosis  Currently, the pain is persistent and rated an 8/10. It is located diffusely in his neck and back. It is relieved for several hours by physical therapy, which he has been attending weekly for approximately 6 weeks (I have not received any notes from PT). He also notes relief from a warmth bath, but feels the pain returns shortly thereafter to the same degree. He has not worked since his last visit  with me on 02/08/12. His job is a Nature conservation officer at [employer], and his pain prevents him from lifting. He is still able to carry out his activities of daily living. He is not currently taking any analgesic medication, only his HIV meds.    Hypertension: Patient also noted to have elevated BP on previous encounters, but claims that he was never treated. Denies CP, SOB, unilateral weakness.  BP Readings from Last 3 Encounters:  03/19/12 145/107  03/17/12 148/90  02/18/12 142/100     Review of Systems Negative unless stated in HPI     Objective:   Physical Exam BP 145/107  Pulse 86  Temp 99.6 F (37.6 C) (Oral)  Ht 6' (1.829 m)  Wt 201 lb (91.173 kg)  BMI 27.26 kg/m2 Gen: AAM, non ill appearing, pleasant and conversant CV: RRR, no murmurs Lungs: CTA-B MSK: patient very slow to rise from chair with obvious discomfort; normal ROM of upper extremities and normal muscle tone without any deformities of the shoulder or spine; + 12/18 tender points along neck, back, shoulders, hips, and knees  Erythrocyte Sedimentation Rate     Component Value Date/Time   ESRSEDRATE 17* 03/19/2012 1537   C-reactive protein: < 0.5      Assessment & Plan:  36 year old HIV positive male with chronic neck and back pain which likely represent fibromyalgia.

## 2012-03-22 ENCOUNTER — Other Ambulatory Visit: Payer: Self-pay

## 2012-03-22 ENCOUNTER — Emergency Department (HOSPITAL_COMMUNITY): Payer: Self-pay

## 2012-03-22 ENCOUNTER — Emergency Department (HOSPITAL_COMMUNITY)
Admission: EM | Admit: 2012-03-22 | Discharge: 2012-03-22 | Disposition: A | Discharge status: Home / Self Care | Payer: Self-pay | Attending: Emergency Medicine | Admitting: Emergency Medicine

## 2012-03-22 ENCOUNTER — Encounter (HOSPITAL_COMMUNITY): Payer: Self-pay | Admitting: *Deleted

## 2012-03-22 DIAGNOSIS — R42 Dizziness and giddiness: Secondary | ICD-10-CM | POA: Insufficient documentation

## 2012-03-22 DIAGNOSIS — B9789 Other viral agents as the cause of diseases classified elsewhere: Secondary | ICD-10-CM | POA: Insufficient documentation

## 2012-03-22 DIAGNOSIS — E860 Dehydration: Secondary | ICD-10-CM | POA: Insufficient documentation

## 2012-03-22 DIAGNOSIS — B349 Viral infection, unspecified: Secondary | ICD-10-CM

## 2012-03-22 LAB — CBC
HCT: 40.8 % (ref 39.0–52.0)
Hemoglobin: 14.1 g/dL (ref 13.0–17.0)
MCH: 30.2 pg (ref 26.0–34.0)
MCHC: 34.6 g/dL (ref 30.0–36.0)
MCV: 87.4 fL (ref 78.0–100.0)
Platelets: 154 10*3/uL (ref 150–400)
RBC: 4.67 MIL/uL (ref 4.22–5.81)
RDW: 13.1 % (ref 11.5–15.5)
WBC: 14.1 10*3/uL — ABNORMAL HIGH (ref 4.0–10.5)

## 2012-03-22 LAB — BASIC METABOLIC PANEL WITH GFR
CO2: 26 meq/L (ref 19–32)
Calcium: 9.7 mg/dL (ref 8.4–10.5)
Creatinine, Ser: 1.19 mg/dL (ref 0.50–1.35)
GFR calc non Af Amer: 78 mL/min — ABNORMAL LOW (ref >90)
Glucose, Bld: 120 mg/dL — ABNORMAL HIGH (ref 70–99)

## 2012-03-22 LAB — PRO B NATRIURETIC PEPTIDE: Pro B Natriuretic peptide (BNP): 40.6 pg/mL (ref 0–125)

## 2012-03-22 LAB — BASIC METABOLIC PANEL
BUN: 13 mg/dL (ref 6–23)
Chloride: 100 mEq/L (ref 96–112)
GFR calc Af Amer: >90 mL/min (ref >90)
Potassium: 3.3 mEq/L — ABNORMAL LOW (ref 3.5–5.1)
Sodium: 134 mEq/L — ABNORMAL LOW (ref 135–145)

## 2012-03-22 LAB — POCT I-STAT TROPONIN I: Troponin i, poc: 0 ng/mL (ref 0.00–0.08)

## 2012-03-22 MED ORDER — SODIUM CHLORIDE 0.9 % IV BOLUS (SEPSIS)
1000.0000 mL | Freq: Once | INTRAVENOUS | Status: AC
  Administered 2012-03-22: 1000 mL via INTRAVENOUS

## 2012-03-22 MED ORDER — KETOROLAC TROMETHAMINE 30 MG/ML IJ SOLN
30.0000 mg | Freq: Once | INTRAMUSCULAR | Status: AC
  Administered 2012-03-22: 30 mg via INTRAVENOUS
  Filled 2012-03-22: qty 1

## 2012-03-22 MED ORDER — BENZONATATE 100 MG PO CAPS: 100.0000 mg | ORAL_CAPSULE | Freq: Three times a day (TID) | ORAL | Status: DC

## 2012-03-22 NOTE — ED Notes (Signed)
Pt states that he has sore throat, cough, congestion, fever, generalized chest pain that is worse with coughing. Pt states that he has had N/V as well. Pt states that when he vomited the first time  it was "dark black gunky stuff then the other times it was mucous"

## 2012-03-22 NOTE — ED Notes (Signed)
Patient reports he had onset of chest pain, sob, fevers, headaches and dizziness on yesterday.  Patient has noted tachycardia in triage/irreg rhythm.  Patient denies hx of pe/dvt

## 2012-03-22 NOTE — ED Provider Notes (Signed)
History     CSN: [id number]  Arrival date & time 03/22/12  1049   First MD Initiated Contact with Patient 03/22/12 1242      Chief Complaint  Patient presents with  . Chest Pain  . Dizziness  . Shortness of Breath  . Headache    (Consider location/radiation/quality/duration/timing/severity/associated sxs/prior treatment) HPI Hx from pt. Phillip Gill is a 36 y.o. male with hx of HIV and fibromyalgia who presents with c/o several days of cough, congestion, chest pain, shortness of breath and dizziness. States that this started on Monday. He has not taken any medications at home for his sx. Cough has been productive of mucus although he did have one episode of vomiting which contained "black gunky stuff." He has not seen any blood in his sputum. Chest pain is intermittent and located to the left side of his chest. It does not get worse with position, movement, or inspiration. It does worsen with palpation of the area. He has been around several people recently who have been sick with upper respiratory infections. He reports decreased appetite over the past several days and has not been eating or drinking well. He denies recent travel, immobilization, hospitalization, surgery, trauma. He has not had any swelling of extremities. He denies any personal history of cancer or clotting disorders.  Patient does have a history of HIV and is followed by Dr. Luciana Axe. A review of the previous chart indicates that he had his CD4 level checked 4 days ago which was 900.  Past Medical History  Diagnosis Date  . Anxiety   . Depression   . Sciatica of left side   . HIV infection     followed by Dr. Luciana Axe- sees him every 4 months  . Fibromyalgia muscle pain 02/11/2012    36 year old male with HIV and chronic neck and back neck pain who presents for follow up of neck pain. This problem has been persistent for several years and has been evaluated by Dr. Edmonia James, his previous PCP numerous times. He also  brings it up as a concern at his appointment with the infectious disease doctors and has also presented to the ED for this problem approximately. He was prescribed M    Past Surgical History  Procedure Date  . Wisdom tooth extraction     Family History  Problem Relation Age of Onset  . Mental illness Neg Hx   . Hypertension Mother   . Diabetes Mother   . Stroke Mother     cerbral aneurysm  . Hypertension Brother     History  Substance Use Topics  . Smoking status: Current Every Day Smoker -- 0.5 packs/day for 15 years    Types: Cigarettes  . Smokeless tobacco: Never Used  . Alcohol Use: Yes     every 2 weeks hard liquor      Review of Systems  Constitutional: Positive for fever (Subjective) and chills. Negative for activity change and appetite change.  HENT: Positive for congestion and sore throat. Negative for trouble swallowing, neck pain and neck stiffness.   Respiratory: Positive for cough and shortness of breath. Negative for chest tightness.   Cardiovascular: Positive for chest pain. Negative for palpitations and leg swelling.  Gastrointestinal: Positive for nausea. Negative for vomiting and abdominal pain.  Musculoskeletal: Positive for myalgias.  Skin: Negative for rash.  Neurological: Positive for headaches. Negative for dizziness and weakness.  All other systems reviewed and are negative.    Allergies  Bactrim; Other; and  Sertraline hcl  Home Medications   Current Outpatient Rx  Name Route Sig Dispense Refill  . AMITRIPTYLINE HCL 50 MG PO TABS Oral Take 50 mg by mouth at bedtime.    Marland Kitchen DARUNAVIR ETHANOLATE 800 MG PO TABS Oral Take 1 tablet (800 mg total) by mouth daily. 30 tablet 11  . EMTRICITABINE-TENOFOVIR 200-300 MG PO TABS Oral Take 1 tablet by mouth daily. 30 tablet 11  . IBUPROFEN 200 MG PO TABS Oral Take 800 mg by mouth every 6 (six) hours as needed. For pain    . LISINOPRIL 20 MG PO TABS Oral Take 20 mg by mouth daily.    Marland Kitchen RITONAVIR 100 MG PO  TABS Oral Take 1 tablet (100 mg total) by mouth daily. 30 tablet 11    BP 184/66  Pulse 73  Temp 97.8 F (36.6 C) (Oral)  Resp 14  Ht 6' (1.829 m)  Wt 200 lb (90.719 kg)  BMI 27.12 kg/m2  SpO2 98%  Physical Exam  Nursing note and vitals reviewed. Constitutional: He is oriented to person, place, and time. He appears well-developed and well-nourished. No distress.       Nontoxic-appearing  HENT:  Head: Normocephalic and atraumatic.  Right Ear: External ear normal.  Left Ear: External ear normal.  Mouth/Throat: Oropharynx is clear and moist. No oropharyngeal exudate.       Tympanic membranes normal bilaterally, sinuses nontender to palpation  Eyes: Conjunctivae normal and EOM are normal. Pupils are equal, round, and reactive to light.  Neck: Normal range of motion. Neck supple.       Neck supple, negative meningeal signs  Cardiovascular: Normal rate, regular rhythm and normal heart sounds.        Tachycardic  Pulmonary/Chest: Effort normal and breath sounds normal. No respiratory distress. He has no wheezes. He exhibits tenderness.       Reproducibly tender to palpation over the left anterior chest  Abdominal: Soft. Bowel sounds are normal. There is no tenderness. There is no rebound and no guarding.  Musculoskeletal: Normal range of motion. He exhibits no edema.       Calf supple and symmetric, no lower extremity edema, no pain elicited with dorsiflexion at ankles  Lymphadenopathy:    He has no cervical adenopathy.  Neurological: He is alert and oriented to person, place, and time.  Skin: Skin is warm and dry. No rash noted. He is not diaphoretic.  Psychiatric: He has a normal mood and affect.    ED Course  Procedures (including critical care time)  Date: 03/22/2012  Rate: 92  Rhythm: normal sinus rhythm  QRS Axis: normal  Intervals: normal  ST/T Wave abnormalities: nonspecific T wave changes  Conduction Disutrbances:none  Narrative Interpretation:   Old EKG Reviewed:  as compared with June 2013 no significant changes    Labs Reviewed  CBC - Abnormal; Notable for the following:    WBC 14.1 (*)     All other components within normal limits  BASIC METABOLIC PANEL - Abnormal; Notable for the following:    Sodium 134 (*)     Potassium 3.3 (*)     Glucose, Bld 120 (*)     GFR calc non Af Amer 78 (*)     All other components within normal limits  PRO B NATRIURETIC PEPTIDE  POCT I-STAT TROPONIN I  LAB REPORT - SCANNED   Dg Chest Port 1 View  03/22/2012  *RADIOLOGY REPORT*  Clinical Data: Chest pain, dizziness and shortness of breath. Headache.  PORTABLE CHEST - 1 VIEW  Comparison: Chest x-ray 07/13/2011.  Findings: Lung volumes are normal.  No consolidative airspace disease.  No pleural effusions.  No pneumothorax.  No pulmonary nodule or mass noted.  Pulmonary vasculature and the cardiomediastinal silhouette are within normal limits.   A safety pin projects over the lower lateral right hemithorax.  IMPRESSION: 1. No radiographic evidence of acute cardiopulmonary disease.   Original Report Authenticated By: Florencia Reasons, M.D.      1. Viral syndrome   2. Dehydration       MDM  Pt with c/o chest pain, fever, cough, congestion, dizziness. He was noted to be tachycardic on initial exam. Pt has no risk factors for PE. He is HIV+ but had a T4 count of 900 which was drawn 4 days ago so doubt opportunistic infection; no evidence of PNA on CXR. He was given 1 L of IV fluids and his heart rate came down well - suspicion further lowered for PE. He does state that he hasn't been eating or drinking well the past few days with his illness so suspect this is more of a mild dehydration. He does have a mild leukocytosis (labs ordered in triage did not include differential); would favor this as a nonspecific reaction to viral illness. His chest pain is reproducible on exam and responded very well to Toradol. Pt given reassurance, instructions on symptomatic treatment.  Reasons to return discussed.       Grant Fontana, [place] 03/23/12 [phone]

## 2012-03-24 ENCOUNTER — Ambulatory Visit: Payer: Self-pay

## 2012-03-26 NOTE — ED Provider Notes (Signed)
Medical screening examination/treatment/procedure(s) were performed by non-physician practitioner and as supervising physician I was immediately available for consultation/collaboration.   Chloris Marcoux Y. Naja Apperson, MD 03/26/12 1353 

## 2012-03-31 ENCOUNTER — Ambulatory Visit (INDEPENDENT_AMBULATORY_CARE_PROVIDER_SITE_OTHER): Payer: Self-pay | Admitting: Sports Medicine

## 2012-03-31 DIAGNOSIS — M542 Cervicalgia: Secondary | ICD-10-CM

## 2012-03-31 DIAGNOSIS — M545 Low back pain, unspecified: Secondary | ICD-10-CM

## 2012-03-31 MED ORDER — MELOXICAM 15 MG PO TABS: 15.0000 mg | ORAL_TABLET | Freq: Every day | ORAL | Status: DC

## 2012-03-31 MED ORDER — PREDNISONE 10 MG PO KIT: PACK | ORAL | Status: DC

## 2012-03-31 NOTE — Progress Notes (Signed)
  Subjective:    Patient ID: Phillip Gill, male    DOB: 1976/05/01, 36 y.o.   MRN: [id number]  HPI Patient comes in today for followup on neck and low back pain. Please see previous office notes for detailed history. Since his last visit he has been diagnosed with fibromyalgia and is currently on 50 mg of amitriptyline each bedtime. This does seem to help with his nighttime pain. He is also recently received the "orange card". He remains out of work.    Review of Systems     Objective:   Physical Exam Well-developed, well-nourished. No acute distress. Awake alert and oriented x3  Cervical spine shows limited cervical range of motion in all planes secondary to pain. No tenderness along cervical midline but diffuse tenderness along the paraspinal musculature with no spasm. No focal neurological deficits of either upper extremity grossly.  Lumbar spine shows limited flexion and extension secondary to pain. Diffuse tenderness to palpation but nothing focal. Negative straight leg raise bilaterally. No focal neurological deficits of either lower extremity.       Assessment & Plan:  1. Neck pain with MRI evidence of mild degenerative changes 2. Low back pain with MRI evidence of mild facet arthropathy 3. Fibromyalgia 4. HIV  Prescription for a 6 day Sterapred Dosepak to take as directed was provided. I also placed him on Mobic to take 15 mg daily at the completion of his Dosepak. Continue on amitriptyline and followup with his PCP for continued treatment of his fibromyalgia. Again, I do not think a degenerative changes seen on his MRIs warrant an extended absence from work. He will discuss with his PCP whether or not he needs to be out of work for his fibromyalgia. He will followup with him on October 16 and I will give him an out of work note until that visit. He will followup with me when necessary.

## 2012-04-02 ENCOUNTER — Ambulatory Visit: Payer: No Typology Code available for payment source | Attending: Sports Medicine

## 2012-04-02 DIAGNOSIS — M2560 Stiffness of unspecified joint, not elsewhere classified: Secondary | ICD-10-CM | POA: Insufficient documentation

## 2012-04-02 DIAGNOSIS — IMO0001 Reserved for inherently not codable concepts without codable children: Secondary | ICD-10-CM | POA: Insufficient documentation

## 2012-04-02 DIAGNOSIS — M2550 Pain in unspecified joint: Secondary | ICD-10-CM | POA: Insufficient documentation

## 2012-04-02 DIAGNOSIS — R293 Abnormal posture: Secondary | ICD-10-CM | POA: Insufficient documentation

## 2012-04-03 ENCOUNTER — Other Ambulatory Visit: Payer: Self-pay | Admitting: Licensed Clinical Social Worker

## 2012-04-03 ENCOUNTER — Ambulatory Visit: Payer: Self-pay | Admitting: Internal Medicine

## 2012-04-03 DIAGNOSIS — B20 Human immunodeficiency virus [HIV] disease: Secondary | ICD-10-CM

## 2012-04-03 MED ORDER — RITONAVIR 100 MG PO TABS: 100.0000 mg | ORAL_TABLET | Freq: Every day | ORAL | Status: DC

## 2012-04-03 MED ORDER — EMTRICITABINE-TENOFOVIR DF 200-300 MG PO TABS: 1.0000 | ORAL_TABLET | Freq: Every day | ORAL | Status: DC

## 2012-04-07 ENCOUNTER — Other Ambulatory Visit: Payer: Self-pay | Admitting: Licensed Clinical Social Worker

## 2012-04-07 DIAGNOSIS — B20 Human immunodeficiency virus [HIV] disease: Secondary | ICD-10-CM

## 2012-04-07 MED ORDER — EMTRICITABINE-TENOFOVIR DF 200-300 MG PO TABS: 1.0000 | ORAL_TABLET | Freq: Every day | ORAL | Status: DC

## 2012-04-09 ENCOUNTER — Other Ambulatory Visit: Payer: Self-pay | Admitting: *Deleted

## 2012-04-09 MED ORDER — MELOXICAM 15 MG PO TABS: 15.0000 mg | ORAL_TABLET | Freq: Every day | ORAL | Status: DC

## 2012-04-09 MED ORDER — PREDNISONE 10 MG PO KIT: PACK | ORAL | Status: DC

## 2012-04-16 ENCOUNTER — Ambulatory Visit (INDEPENDENT_AMBULATORY_CARE_PROVIDER_SITE_OTHER): Payer: Self-pay | Admitting: Family Medicine

## 2012-04-16 ENCOUNTER — Encounter: Payer: Self-pay | Admitting: Family Medicine

## 2012-04-16 VITALS — BP 157/110 | HR 71 | Ht 72.0 in | Wt 206.0 lb

## 2012-04-16 DIAGNOSIS — IMO0001 Reserved for inherently not codable concepts without codable children: Secondary | ICD-10-CM

## 2012-04-16 DIAGNOSIS — M797 Fibromyalgia: Secondary | ICD-10-CM

## 2012-04-16 MED ORDER — DULOXETINE HCL 30 MG PO CPEP: 30.0000 mg | ORAL_CAPSULE | Freq: Every day | ORAL | Status: DC

## 2012-04-16 NOTE — Progress Notes (Signed)
  Subjective:    Patient ID: Phillip Gill, male    DOB: 1976-03-15, 36 y.o.   MRN: [id number]  HPI  36 year old male with HIV and diffuse, severe pain who I recently diagnosed with fibromyalgia at his previous visit. At that time, he was started on amitriptyline 50 mg QHS. He notes that he has been taking the medication each night, and it helped him sleep well initially. However, that effect has worn off, and he now believes that his pain is worse over the last 4 weeks since taking the medication. His partner is present and notes that Phillip Gill is having fits of pain at home that will leave him in tears. He was also evaluated by Dr. Margaretha Sheffield in the [facility] on 9/30 for the pain. Dr. Margaretha Sheffield prescribed a dose pack of steroids and meloxicam and enforced that this is not a musculoskeletal issue, but rather a nerve problem needing attention from a primary care physician. Phillip Gill has not worked since August, because his employer, [employer], did not allow him light duty.    Review of Systems Positive for shooting neck pain, lower back pain, left leg pain and poor sleeping    Objective:   Physical Exam BP 157/110  Pulse 71  Ht 6' (1.829 m)  Wt 206 lb (93.441 kg)  BMI 27.94 kg/m2 Gen: distressed, crying and writhing in pain when I walk in the room, walks around the room to relieve pain Neuro: TTP through neck, back and lower extremities, no focal weakness Psych: very emotionally labile today compared to previous days      Assessment & Plan:  36 year old M with poorly controlled fibromyalgia. I spent > 60 minutes face time with the patient and his partner counseling about primary pain syndromes, the expected course, the treatment modalities, and other social factors.

## 2012-04-16 NOTE — Patient Instructions (Signed)
Dear Mr. Vane,   Thank you for coming to clinic today. Please read below regarding the issues that we discussed.   Fibromyalgia Pain - I am sorry for your troubles with this condition. I can see that it is very bothersome for you. The treatment that have proven to improve a patient's function are regular sleep and regular exercise. I know it sounds crazy. The medication is also helpful, but it is not a cure. I will send a prescription for this medication to the health department. It may take a while to get that medication. For now, please continue the Elavil each night.   Please follow up in clinic in 3 months. Please call earlier if you have any questions or concerns.   Sincerely,   Dr. Clinton Sawyer

## 2012-04-16 NOTE — Assessment & Plan Note (Signed)
Patient counseled extensively on treatment modalities for the disease. He was encouraged to exercise daily. I provided a letter to the [facility] asking for financial assistance for the patient. Additionally, I encouraged him to practice good sleep hygiene. Lastly, his medication regimen was tailored. His meloxicam and prednisone were discontinued since there was not evidence of inflammation on his ESR, CRP ion September. He will be switched from Amitriptyline to Cymbalta. The only caveat is that he cannot afford cymbalta. Therefore, his Rx was sent to the pharmacy at the [facility]. He may not get approval for the medication. Therefore, he will take amitriptyline until then. He will also return to work. He needs to return to full duty within 4 weeks.

## 2012-04-28 ENCOUNTER — Ambulatory Visit (INDEPENDENT_AMBULATORY_CARE_PROVIDER_SITE_OTHER): Payer: Self-pay | Admitting: Internal Medicine

## 2012-04-28 ENCOUNTER — Other Ambulatory Visit: Payer: Self-pay | Admitting: *Deleted

## 2012-04-28 ENCOUNTER — Encounter: Payer: Self-pay | Admitting: Internal Medicine

## 2012-04-28 VITALS — BP 171/122 | HR 101 | Temp 98.0°F | Wt 201.0 lb

## 2012-04-28 DIAGNOSIS — F32A Depression, unspecified: Secondary | ICD-10-CM

## 2012-04-28 DIAGNOSIS — M797 Fibromyalgia: Secondary | ICD-10-CM

## 2012-04-28 DIAGNOSIS — I10 Essential (primary) hypertension: Secondary | ICD-10-CM

## 2012-04-28 DIAGNOSIS — F3289 Other specified depressive episodes: Secondary | ICD-10-CM

## 2012-04-28 DIAGNOSIS — F329 Major depressive disorder, single episode, unspecified: Secondary | ICD-10-CM

## 2012-04-28 DIAGNOSIS — IMO0001 Reserved for inherently not codable concepts without codable children: Secondary | ICD-10-CM

## 2012-04-28 DIAGNOSIS — B20 Human immunodeficiency virus [HIV] disease: Secondary | ICD-10-CM

## 2012-04-28 MED ORDER — LISINOPRIL 20 MG PO TABS
20.0000 mg | ORAL_TABLET | Freq: Every day | ORAL | Status: DC
Start: 2012-04-28 — End: 2012-10-30

## 2012-04-28 MED ORDER — DULOXETINE HCL 30 MG PO CPEP: 30.0000 mg | ORAL_CAPSULE | Freq: Every day | ORAL | Status: DC

## 2012-04-28 NOTE — Progress Notes (Signed)
Patient ID: Phillip Gill, male   DOB: 01-Mar-1976, 36 y.o.   MRN: [id number]     [facility] for Infectious Disease  Patient Active Problem List  Diagnosis  . Anxiety  . Seasonal allergies  . HIV disease  . Hypertension  . Hepatitis B infection  . Sciatica  . Depression  . Dizziness of unknown cause  . Fibromyalgia muscle pain    Patient's Medications  New Prescriptions   No medications on file  Previous Medications   DARUNAVIR ETHANOLATE (PREZISTA) 800 MG TABLET    Take 1 tablet (800 mg total) by mouth daily.   DULOXETINE (CYMBALTA) 30 MG CAPSULE    Take 1 capsule (30 mg total) by mouth daily.   EMTRICITABINE-TENOFOVIR (TRUVADA) 200-300 MG PER TABLET    Take 1 tablet by mouth daily.   LISINOPRIL (PRINIVIL,ZESTRIL) 20 MG TABLET    Take 20 mg by mouth daily.   MELOXICAM (MOBIC) 15 MG TABLET    Take 15 mg by mouth daily.   RITONAVIR (NORVIR) 100 MG TABS    Take 1 tablet (100 mg total) by mouth daily.  Modified Medications   No medications on file  Discontinued Medications   AMITRIPTYLINE (ELAVIL) 50 MG TABLET    Take 50 mg by mouth at bedtime.   BENZONATATE (TESSALON) 100 MG CAPSULE    Take 1 capsule (100 mg total) by mouth every 8 (eight) hours.   IBUPROFEN (ADVIL,MOTRIN) 200 MG TABLET    Take 800 mg by mouth every 6 (six) hours as needed. For pain    Subjective: [name] is seen on a work in basis. He has been struggling with generalized pain for the last several months and has been diagnosed with fibromyalgia. He did not respond to a trial of meloxicam, prednisone or amitriptyline. He was recently given a prescription for Cymbalta by his primary care physician but has not been able to obtain it yet. He has checked with the health department medication assistance program and thinks that he may be approved sometime in early November. His also been out of his lisinopril for the past week because he had trouble affording it. He denies missing any doses of his HIV  medications. He admits to being very stressed and depressed recently. He denies any thoughts of hurting himself.  Objective: Temp: 98 F (36.7 C) (10/28 1343) Temp src: Oral (10/28 1343) BP: 171/122 mmHg (10/28 1343) Pulse Rate: 101  (10/28 1343)  General: he is very quiet and speaks slowly Skin: no rash Lungs: clear Cor: regular S1 and S2 with no murmurs Abdomen: nontender  Lab Results HIV 1 RNA Quant (copies/mL)  Date Value  03/18/2012 <20   11/13/2011 <20   07/16/2011 59*     CD4 T Cell Abs (cmm)  Date Value  03/18/2012 900   11/13/2011 900   07/16/2011 980      Assessment: His HIV is under excellent control. I will continue his current antiretroviral regimen.  His blood pressure is out of control off of lisinopril. We will help him to obtain it through the AIDS drug assistance program (ADAP)  We will see if we can help him obtain Cymbalta through the ADAP program  Plan: 1. Continue current antiretroviral regimen 2. See if we can help him obtain lisinopril and Cymbalta 3. Followup with Dr. Luciana Axe here as previously scheduled   Cliffton Asters, MD [facility] for Infectious Disease [facility] [phone] pager   [phone] cell 04/28/2012, 2:02 PM

## 2012-05-06 ENCOUNTER — Ambulatory Visit: Payer: Self-pay | Admitting: Internal Medicine

## 2012-05-12 ENCOUNTER — Encounter: Payer: Self-pay | Admitting: Internal Medicine

## 2012-05-12 ENCOUNTER — Ambulatory Visit (INDEPENDENT_AMBULATORY_CARE_PROVIDER_SITE_OTHER): Payer: Self-pay | Admitting: Internal Medicine

## 2012-05-12 VITALS — BP 150/94 | HR 105 | Temp 98.4°F | Wt 209.0 lb

## 2012-05-12 DIAGNOSIS — B1910 Unspecified viral hepatitis B without hepatic coma: Secondary | ICD-10-CM

## 2012-05-12 DIAGNOSIS — B20 Human immunodeficiency virus [HIV] disease: Secondary | ICD-10-CM

## 2012-05-12 DIAGNOSIS — M797 Fibromyalgia: Secondary | ICD-10-CM

## 2012-05-12 DIAGNOSIS — IMO0001 Reserved for inherently not codable concepts without codable children: Secondary | ICD-10-CM

## 2012-05-12 MED ORDER — IMIQUIMOD 5 % EX CREA: TOPICAL_CREAM | CUTANEOUS | Status: DC

## 2012-05-12 NOTE — Assessment & Plan Note (Signed)
He is doing well with a normal immune system and completely suppressed virus. He will return in 6 months. He knows to use condoms with sexual activity

## 2012-05-12 NOTE — Progress Notes (Signed)
  Subjective:    Patient ID: Phillip Gill, male    DOB: 02-Nov-1975, 36 y.o.   MRN: [id number]  HPI  He comes in for routine followup of his HIV. He continues on Norvir, darunavir and Truvada. He denies any missed doses. He reports excellent compliance and no side effects. He does continue to struggle with fibromyalgia and also has symptoms including intermittent bouts of constipation and diarrhea with some bloating. He has not yet started Cymbalta but is hopeful to get this through the health Department pharmacy in December.  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  Review of Systems  Constitutional: Negative for fever, chills, fatigue and unexpected weight change.       Generalized pain  HENT: Negative for sore throat and trouble swallowing.   Respiratory: Negative for cough.   Cardiovascular: Negative for palpitations and leg swelling.  Gastrointestinal: Negative for nausea, abdominal pain and diarrhea.  Genitourinary: Positive for genital sores. Negative for penile pain.  Musculoskeletal: Negative for myalgias, joint swelling and arthralgias.  Neurological: Negative for dizziness and headaches.       Objective:   Physical Exam  Constitutional: He appears well-developed and well-nourished. No distress.  Cardiovascular: Normal rate, regular rhythm and normal heart sounds.  Exam reveals no gallop and no friction rub.   No murmur heard. Pulmonary/Chest: Effort normal and breath sounds normal. No respiratory distress. He has no wheezes. He has no rales.          Assessment & Plan:

## 2012-05-12 NOTE — Assessment & Plan Note (Signed)
Next visit I will have him get a screening ultrasound to assure no development of hepatocellular carcinoma. He continues on Truvada as a hepatitis B treatment. He was surface antigen positive and DNA positive though was not quantified. I will also check surface antigen and DNA after his next visit

## 2012-05-12 NOTE — Assessment & Plan Note (Signed)
He does continue to struggle with fibromyalgia and has symptoms that do sound somewhat like irritable bowel syndrome. He is going to try the Cymbalta and continue to followup with his primary care physician

## 2012-05-14 ENCOUNTER — Ambulatory Visit (INDEPENDENT_AMBULATORY_CARE_PROVIDER_SITE_OTHER): Payer: Medicaid Other | Admitting: Family Medicine

## 2012-05-14 ENCOUNTER — Encounter: Payer: Self-pay | Admitting: Family Medicine

## 2012-05-14 VITALS — BP 138/97 | HR 85 | Ht 72.0 in | Wt 207.0 lb

## 2012-05-14 DIAGNOSIS — M797 Fibromyalgia: Secondary | ICD-10-CM

## 2012-05-14 DIAGNOSIS — IMO0001 Reserved for inherently not codable concepts without codable children: Secondary | ICD-10-CM

## 2012-05-14 NOTE — Progress Notes (Signed)
  Subjective:    Patient ID: Phillip Gill, male    DOB: July 02, 1976, 36 y.o.   MRN: [id number]  HPI   36 year old M with HIV and fibromyalgia who presents for f/u.   Fibromyalgia - Recently diagnosed; started on Elavil which was not effective so stopped and placed on Cymbalta on 04/16/12. Since that time he has not filled his medications. However, he has been seen in the ID clinic twice for follow up regarding this issue. He has tried to get the medication through the health department, but missed his initial appointment.  It was rescheduled for December 3rd. He received a discount through the program that provides his HIV meds, but he cannot afford th $25 co-pay. He is accompanied by his partner today who states that he will cover the $25 medication if he does not receive it through the health department. He has not returned to work as we discussed in his previous meeting. Additionally, he has not start an exercise program or joined the [facility] as we discussed in his last meeting.    Review of Systems Positive for generalized pain, insomnia, constipation, fatigue, left sided headache     Objective:   Physical Exam BP 138/97  Pulse 85  Ht 6' (1.829 m)  Wt 207 lb (93.895 kg)  BMI 28.07 kg/m2 Gen: AAM, mild distress MSK: generalized tenderness in all muscle groups, no joint effusions Psych: normal affect, normal speech and thought content; unhappy appearing      Assessment & Plan:  36 old M with well controlled HIV and poorly controlled fibromyalgia.

## 2012-05-14 NOTE — Assessment & Plan Note (Signed)
I counseled Phillip Gill on the importance of a multi-pronged approach to treating his fibromyalgia, with the components being sleep hygiene, regular exercise, and medication. I emphasized that he has not been able to carry out any of the parts of the treatment approach since his last visit, so this cannot be considered a treatment failure, and thus I have nothing new to offer. I strongly encouraged him to return to work, as this will help him with income, sleep hygiene, and self esteem. However, I am concerned that he will not carry this out, because he is applying for disability. My nurse called the medication assistance program at the health department, and they will provide Phillip Gill with samples within the next 24 - 48 hours. He will return to see me 6 weeks after starting the medication for an appropriate f/u.

## 2012-05-21 ENCOUNTER — Telehealth: Payer: Self-pay | Admitting: Family Medicine

## 2012-05-21 ENCOUNTER — Encounter: Payer: Self-pay | Admitting: Family Medicine

## 2012-05-21 NOTE — Telephone Encounter (Signed)
Patient is calling asking for a note for school.  He needs it to be specific listing dates from July until present of when he had appts at [facility].  Please call him when it is ready to be picked up.

## 2012-05-21 NOTE — Telephone Encounter (Signed)
Letter is written and msg LMOM for pt to pick up.

## 2012-06-03 ENCOUNTER — Encounter: Payer: Self-pay | Admitting: Home Health Services

## 2012-06-03 DIAGNOSIS — F17200 Nicotine dependence, unspecified, uncomplicated: Secondary | ICD-10-CM | POA: Insufficient documentation

## 2012-06-11 ENCOUNTER — Emergency Department (HOSPITAL_COMMUNITY): Payer: Medicaid Other

## 2012-06-11 ENCOUNTER — Emergency Department (HOSPITAL_COMMUNITY)
Admission: EM | Admit: 2012-06-11 | Discharge: 2012-06-11 | Disposition: A | Discharge status: Home / Self Care | Payer: Medicaid Other | Attending: Emergency Medicine | Admitting: Emergency Medicine

## 2012-06-11 ENCOUNTER — Encounter (HOSPITAL_COMMUNITY): Payer: Self-pay | Admitting: *Deleted

## 2012-06-11 DIAGNOSIS — Z21 Asymptomatic human immunodeficiency virus [HIV] infection status: Secondary | ICD-10-CM | POA: Insufficient documentation

## 2012-06-11 DIAGNOSIS — F411 Generalized anxiety disorder: Secondary | ICD-10-CM | POA: Insufficient documentation

## 2012-06-11 DIAGNOSIS — IMO0001 Reserved for inherently not codable concepts without codable children: Secondary | ICD-10-CM | POA: Insufficient documentation

## 2012-06-11 DIAGNOSIS — F329 Major depressive disorder, single episode, unspecified: Secondary | ICD-10-CM | POA: Insufficient documentation

## 2012-06-11 DIAGNOSIS — M5430 Sciatica, unspecified side: Secondary | ICD-10-CM | POA: Insufficient documentation

## 2012-06-11 DIAGNOSIS — F17200 Nicotine dependence, unspecified, uncomplicated: Secondary | ICD-10-CM | POA: Insufficient documentation

## 2012-06-11 DIAGNOSIS — M5412 Radiculopathy, cervical region: Secondary | ICD-10-CM

## 2012-06-11 DIAGNOSIS — F3289 Other specified depressive episodes: Secondary | ICD-10-CM | POA: Insufficient documentation

## 2012-06-11 MED ORDER — IBUPROFEN 800 MG PO TABS: 800.0000 mg | ORAL_TABLET | Freq: Three times a day (TID) | ORAL | Status: DC

## 2012-06-11 MED ORDER — OXYCODONE-ACETAMINOPHEN 5-325 MG PO TABS: 2.0000 | ORAL_TABLET | ORAL | Status: DC | PRN

## 2012-06-11 MED ORDER — IBUPROFEN 400 MG PO TABS
800.0000 mg | ORAL_TABLET | Freq: Once | ORAL | Status: AC
  Administered 2012-06-11: 800 mg via ORAL
  Filled 2012-06-11: qty 2

## 2012-06-11 NOTE — ED Notes (Signed)
Pt is here with right shoulder pain that started yesterday after waking up and hurts with movement

## 2012-06-11 NOTE — ED Provider Notes (Signed)
History   This chart was scribed for Phillip Canal, MD by Toya Smothers, ED Scribe. The patient was seen in room TR05C/TR05C. Patient's care was started at 1806.  CSN: [id number]  Arrival date & time 06/11/12  1806   First MD Initiated Contact with Patient 06/11/12 1825      Chief Complaint  Patient presents with  . Shoulder Pain    Patient is a 36 y.o. male presenting with shoulder pain.  Shoulder Pain    Phillip Gill is a 36 y.o. male with a h/o arthritis, fibromyalgia, and HIV (CD 800, viral load undetectable) who presents to the Emergency Department complaining of 1 day of new, sudden onset, moderate constant right shoulder pain radiating to the neck. Pt denies injury. Pain is burning, sharp, aggravated with pressure and certain positions, and alleviated by nothing. No chest pain or SOB. No fever, chills, cough, congestion, rhinorrhea, chest pain, SOB, or n/v/d. Pt is a current everyday smoker, admits alcohol use, and denies illicit drug use.     Past Medical History  Diagnosis Date  . Anxiety   . Depression   . Sciatica of left side   . HIV infection     followed by Dr. Luciana Axe- sees him every 4 months  . Fibromyalgia muscle pain 02/11/2012    36 year old male with HIV and chronic neck and back neck pain who presents for follow up of neck pain. This problem has been persistent for several years and has been evaluated by Dr. Edmonia James, his previous PCP numerous times. He also brings it up as a concern at his appointment with the infectious disease doctors and has also presented to the ED for this problem approximately. He was prescribed M    Past Surgical History  Procedure Date  . Wisdom tooth extraction     Family History  Problem Relation Age of Onset  . Mental illness Neg Hx   . Hypertension Mother   . Diabetes Mother   . Stroke Mother     cerbral aneurysm  . Hypertension Brother     History  Substance Use Topics  . Smoking status: Current Every Day Smoker --  0.5 packs/day for 15 years    Types: Cigarettes  . Smokeless tobacco: Never Used  . Alcohol Use: Yes     Comment: every 2 weeks hard liquor      Review of Systems  Musculoskeletal: Positive for myalgias.  All other systems reviewed and are negative.    Allergies  Bactrim; Other; Sertraline hcl; and Zoloft  Home Medications   Current Outpatient Rx  Name  Route  Sig  Dispense  Refill  . DARUNAVIR ETHANOLATE 800 MG PO TABS   Oral   Take 1 tablet (800 mg total) by mouth daily.   30 tablet   11   . DULOXETINE HCL 30 MG PO CPEP   Oral   Take 1 capsule (30 mg total) by mouth daily.   30 capsule   5   . EMTRICITABINE-TENOFOVIR 200-300 MG PO TABS   Oral   Take 1 tablet by mouth daily.   30 tablet   11   . IMIQUIMOD 5 % EX CREA   Topical   Apply topically 3 (three) times a week.   12 each   1   . LISINOPRIL 20 MG PO TABS   Oral   Take 1 tablet (20 mg total) by mouth daily.   30 tablet   5   . RITONAVIR  100 MG PO TABS   Oral   Take 1 tablet (100 mg total) by mouth daily.   30 tablet   11   . IBUPROFEN 800 MG PO TABS   Oral   Take 1 tablet (800 mg total) by mouth 3 (three) times daily.   30 tablet   0   . OXYCODONE-ACETAMINOPHEN 5-325 MG PO TABS   Oral   Take 2 tablets by mouth every 4 (four) hours as needed for pain.   10 tablet   0     BP 151/99  Pulse 87  Temp 98.1 F (36.7 C) (Oral)  Resp 18  SpO2 98%  Physical Exam  Constitutional: He is oriented to person, place, and time. He appears well-developed and well-nourished.  HENT:  Head: Normocephalic and atraumatic.  Eyes: Conjunctivae normal and EOM are normal. Pupils are equal, round, and reactive to light.  Neck: Normal range of motion. Neck supple.       Right trapezius tenderness. R paracervical tenderness. No midline tenderness.   Cardiovascular: Normal rate.   Pulmonary/Chest: Effort normal.  Abdominal: Soft.  Musculoskeletal: Normal range of motion.  Neurological: He is alert and  oriented to person, place, and time.       NL strength and sensation throughout.   Skin: Skin is warm and dry.  Psychiatric: He has a normal mood and affect. His behavior is normal. Judgment and thought content normal.    ED Course  Procedures DIAGNOSTIC STUDIES: Oxygen Saturation is 98% on room air, normal by my interpretation.    COORDINATION OF CARE: 18:37- Evaluated Pt. Pt is awake, alert, and without distress.      Labs Reviewed - No data to display Dg Shoulder Right  06/11/2012  *RADIOLOGY REPORT*  Clinical Data: Right shoulder pain.  No injury.  RIGHT SHOULDER - 2+ VIEW  Comparison: None.  Findings: Right shoulder is located.  Internal and external rotation views appear within normal limits.  Scapular Y view is normal.  No fracture.  Visualized right chest normal.  IMPRESSION: Negative.   Original Report Authenticated By: Andreas Newport, M.D.      1. Cervical radiculopathy       MDM  Phillip Gill is a 36 y.o. male here with cervical radiculopathy. No red flags or concerns for spinal compression. Recommend motrin and percocet prn. Recommend outpatient f/u and possible physical therapy.     I personally performed the services described in this documentation, which was scribed in my presence. The recorded information has been reviewed and is accurate.   Phillip Canal, MD 06/11/12 [phone]

## 2012-06-18 ENCOUNTER — Ambulatory Visit (INDEPENDENT_AMBULATORY_CARE_PROVIDER_SITE_OTHER): Payer: Self-pay | Admitting: Family Medicine

## 2012-06-18 ENCOUNTER — Encounter: Payer: Self-pay | Admitting: Family Medicine

## 2012-06-18 VITALS — BP 139/98 | HR 79 | Ht 72.0 in | Wt 206.7 lb

## 2012-06-18 DIAGNOSIS — IMO0001 Reserved for inherently not codable concepts without codable children: Secondary | ICD-10-CM

## 2012-06-18 DIAGNOSIS — R739 Hyperglycemia, unspecified: Secondary | ICD-10-CM

## 2012-06-18 DIAGNOSIS — M797 Fibromyalgia: Secondary | ICD-10-CM

## 2012-06-18 DIAGNOSIS — R7309 Other abnormal glucose: Secondary | ICD-10-CM

## 2012-06-18 LAB — POCT GLYCOSYLATED HEMOGLOBIN (HGB A1C): Hemoglobin A1C: 5.4

## 2012-06-18 NOTE — Progress Notes (Signed)
  Subjective:    Patient ID: Phillip Gill, male    DOB: 26-Mar-1976, 36 y.o.   MRN: [id number]  HPI  36 year old M with a history of fibromyalgia who presents for ED follow up. He presented to the ED last week with shooting pain in his right arm. An X-ray was performed, which was WNL, and he was treated with Percocet. Since that time, he pain has been consistent and radiates down his right arm. He describes is at as a burning or stinging. He denies any trauma. He is currently taking cymbalta 30 mg daily for fibromyalgia. He notes that this has caused intermittent diarrhea and hot flashes.   Blood Sugar - Patient concerned about his blood sugar because he has multiple relatives, including his mother with diabetes. He also notes polyuria. Had to elevated CBG's in Sept, but otherwise normal prior to that.    Review of Systems See HPI    Objective:   Physical Exam BP 139/98  Pulse 79  Ht 6' (1.829 m)  Wt 206 lb 11.2 oz (93.759 kg)  BMI 28.03 kg/m2 Gen: AAM, in moderate distress, conversant MSK:    Neck: normal ROM, positive Spurling's test bilaterally, TTP along paraspinal muscles   UE: 5/5 strength of upper extremities; TTP of muscles and shoulder joints bilaterally Neuro: normal biceps and brachioradialis reflexes bilaterally    Assessment & Plan:  36 year old M presenting for evaluation of acute on chronic neck pain. This is likely related to his chronic pain syndrome and not a true radiculopathy as he had a cervical spine MRI 6 months ago that was negative for impingement. Increases Duloxetine to 60 mg daily.  Check A1C to r/o persistent hyperglycemia.   [name], MD, MBA 06/19/2012, 5:03 PM Family Medicine Resident, PGY-2 [phone] pager

## 2012-06-18 NOTE — Patient Instructions (Addendum)
Please increase the cymbalta to 60 mg daily. I will check your A1c to see if it is elevated and send you a letter with the results.   Please return to clinic in 3-4 weeks.   Sincerely,   Dr. Clinton Sawyer

## 2012-06-19 ENCOUNTER — Ambulatory Visit: Payer: Self-pay

## 2012-06-19 NOTE — Assessment & Plan Note (Signed)
Increase Cymbalta to 60 mg daily. F/u in 3 weeks.

## 2012-06-30 ENCOUNTER — Telehealth: Payer: Self-pay | Admitting: Family Medicine

## 2012-06-30 DIAGNOSIS — F32A Depression, unspecified: Secondary | ICD-10-CM

## 2012-06-30 DIAGNOSIS — F329 Major depressive disorder, single episode, unspecified: Secondary | ICD-10-CM

## 2012-06-30 MED ORDER — DULOXETINE HCL 60 MG PO CPEP: 60.0000 mg | ORAL_CAPSULE | Freq: Every day | ORAL | Status: DC

## 2012-06-30 NOTE — Telephone Encounter (Signed)
It is noted on 12/18 that patient was instructed to increase Cymbalta to 60 mg daily.   [facility] pharmacy needs new RX . Called and verbally gave Rx to [facility]  # 30 and 1 refill. She will explain to patient that this is a 60 mg tab and to take one daily.   Will forward to Dr. Clinton Sawyer to sign off this encounter.

## 2012-06-30 NOTE — Telephone Encounter (Signed)
I agree with this medication increase. Thank you.

## 2012-06-30 NOTE — Telephone Encounter (Signed)
Patient is calling because the Health Department needs a new Rx with the increase dose on his Cymbalta.

## 2012-07-17 ENCOUNTER — Ambulatory Visit (INDEPENDENT_AMBULATORY_CARE_PROVIDER_SITE_OTHER): Payer: No Typology Code available for payment source | Admitting: Family Medicine

## 2012-07-17 VITALS — BP 154/111 | HR 79 | Temp 98.4°F | Ht 72.0 in | Wt 203.0 lb

## 2012-07-17 DIAGNOSIS — IMO0001 Reserved for inherently not codable concepts without codable children: Secondary | ICD-10-CM

## 2012-07-17 DIAGNOSIS — H6120 Impacted cerumen, unspecified ear: Secondary | ICD-10-CM

## 2012-07-17 DIAGNOSIS — H6123 Impacted cerumen, bilateral: Secondary | ICD-10-CM

## 2012-07-17 DIAGNOSIS — M797 Fibromyalgia: Secondary | ICD-10-CM

## 2012-07-17 DIAGNOSIS — Z23 Encounter for immunization: Secondary | ICD-10-CM

## 2012-07-17 NOTE — Progress Notes (Signed)
  Subjective:    Patient ID: Phillip Gill, male    DOB: 1976/05/31, 37 y.o.   MRN: [id number]  HPI  37 year old M w/ fibromyalgia and HIV who presents for follow up.   Fibromyalgia - He previously started on elavil which was stopped in October b/c it made his problem worse. He was then started on cymbalta 30 mg in November with minimal improvement, so he was increase to 60 mg daily since his last visit. Meanwhile, he has not been exercising regularly as recommended.  Today, he is complaining of numerous symptoms including numbness in his hands and neck and back pain. He recognizes only minimal benefit from taking duloxetine. However, he is concerned that he is having side effects from the medications including diarrhea, abdominal pain, nausea and decreased libido. He denies fever, hematochezia, hematemesis, and melena.   Decreased Hearing - Bilateral, denies pain, has been present for several months  Review of Systems See HPI   Objective:   Physical Exam BP 154/111  Pulse 79  Temp 98.4 F (36.9 C)  Ht 6' (1.829 m)  Wt 203 lb (92.08 kg)  BMI 27.53 kg/m2 BP Readings from Last 3 Encounters:  07/17/12 154/111  06/18/12 139/98  06/11/12 151/99    Gen: AAM, mild distress, emotionally labile Ears: impacted cerumen bilaterally  MSK/Neuro:  > Neck: TTP along spinous procceses and paraspinal muscles, decreased ROM > Back: TTP along spinous procceses and paraspinal muscles > Upper Extremities: 5/5 strength, 5/5 grip strength, 2+ DTR of biceps bilaterally     Assessment & Plan:  37 year old M with uncontrolled primary pain disorder and impacted cerumen.

## 2012-07-17 NOTE — Patient Instructions (Addendum)
Dear Mr. Auman,   I am going to send a note to Dr. Luciana Axe to verify that he does not think this is related to the HIV meds. If he does not think your pain is related to the medications, then I will make a referral to a rheumatologist. If he thinks it is related to the meds, then I will call and let you know so you can get in sooner to his office.   Ear wax - Please use the warm water and peroxide 3-4 times day. Leave it in place for 10 minutes. Then wipe with a soft tissue, but not a Q-tip. Also, consider trying to Colace capsules and sqeezing the gel into your ear before you go to bed as this can break down the wax.   Please continue taking your blood pressure.   Follow up in 3 months,   Dr. Clinton Sawyer

## 2012-07-21 DIAGNOSIS — H6123 Impacted cerumen, bilateral: Secondary | ICD-10-CM | POA: Insufficient documentation

## 2012-07-21 NOTE — Assessment & Plan Note (Signed)
Told to use warm water and peroxide 4 x a day and use colace gel in ears at night. Do not use Q-tips.

## 2012-07-21 NOTE — Assessment & Plan Note (Signed)
Completely unchanged physical exam from baseline prior to diagnosis and treatment of fibromyalgia. I have no other data supporting an alternative diagnosis, but I would like to refer him to a rheumatologist. However, the patient does not have insurance, and there are no rheumatologists available. I do not have another strategy for the patient at this point. I encouraged him to continue taking cymbalta 60 mg daily. He might be benefited from further depression treatment, as his partner states that his is very bothersome for him.

## 2012-07-24 ENCOUNTER — Ambulatory Visit: Payer: No Typology Code available for payment source

## 2012-07-25 ENCOUNTER — Telehealth: Payer: Self-pay | Admitting: Family Medicine

## 2012-07-25 DIAGNOSIS — G629 Polyneuropathy, unspecified: Secondary | ICD-10-CM

## 2012-07-25 MED ORDER — GABAPENTIN 300 MG PO CAPS: 300.0000 mg | ORAL_CAPSULE | Freq: Three times a day (TID) | ORAL | Status: DC

## 2012-07-25 NOTE — Telephone Encounter (Signed)
Spoke with the patient at length about his continued pain, which sounds unfortunately unimproved. I also explained that there is no rheumatology office that accepts the orange card. For treatment of possible neuropathic pain, we will try neurotin 300 mg TID starting with once daily dosing and titrating upwards. Patient to follow up in 6 weeks.

## 2012-08-01 ENCOUNTER — Encounter (HOSPITAL_COMMUNITY): Payer: Self-pay | Admitting: Emergency Medicine

## 2012-08-01 DIAGNOSIS — R197 Diarrhea, unspecified: Secondary | ICD-10-CM | POA: Insufficient documentation

## 2012-08-01 DIAGNOSIS — R21 Rash and other nonspecific skin eruption: Secondary | ICD-10-CM | POA: Insufficient documentation

## 2012-08-01 DIAGNOSIS — F411 Generalized anxiety disorder: Secondary | ICD-10-CM | POA: Insufficient documentation

## 2012-08-01 DIAGNOSIS — Z79899 Other long term (current) drug therapy: Secondary | ICD-10-CM | POA: Insufficient documentation

## 2012-08-01 DIAGNOSIS — F17200 Nicotine dependence, unspecified, uncomplicated: Secondary | ICD-10-CM | POA: Insufficient documentation

## 2012-08-01 DIAGNOSIS — Z8739 Personal history of other diseases of the musculoskeletal system and connective tissue: Secondary | ICD-10-CM | POA: Insufficient documentation

## 2012-08-01 DIAGNOSIS — F3289 Other specified depressive episodes: Secondary | ICD-10-CM | POA: Insufficient documentation

## 2012-08-01 DIAGNOSIS — F329 Major depressive disorder, single episode, unspecified: Secondary | ICD-10-CM | POA: Insufficient documentation

## 2012-08-01 DIAGNOSIS — Z21 Asymptomatic human immunodeficiency virus [HIV] infection status: Secondary | ICD-10-CM | POA: Insufficient documentation

## 2012-08-01 NOTE — ED Notes (Signed)
C/o rash on face since yesterday and shoulders that started today.  Also reports diarrhea since yesterday.

## 2012-08-02 ENCOUNTER — Emergency Department (HOSPITAL_COMMUNITY)
Admission: EM | Admit: 2012-08-02 | Discharge: 2012-08-02 | Disposition: A | Discharge status: Home / Self Care | Payer: No Typology Code available for payment source | Attending: Emergency Medicine | Admitting: Emergency Medicine

## 2012-08-02 DIAGNOSIS — R197 Diarrhea, unspecified: Secondary | ICD-10-CM

## 2012-08-02 DIAGNOSIS — R21 Rash and other nonspecific skin eruption: Secondary | ICD-10-CM

## 2012-08-02 MED ORDER — HYDROXYZINE HCL 25 MG PO TABS
25.0000 mg | ORAL_TABLET | Freq: Three times a day (TID) | ORAL | Status: DC | PRN
  Administered 2012-08-02: 25 mg via ORAL
  Filled 2012-08-02: qty 1

## 2012-08-02 MED ORDER — HYDROXYZINE HCL 25 MG PO TABS: 25.0000 mg | ORAL_TABLET | Freq: Three times a day (TID) | ORAL | Status: DC | PRN

## 2012-08-02 MED ORDER — HYDROCORTISONE 1 % EX CREA
TOPICAL_CREAM | Freq: Three times a day (TID) | CUTANEOUS | Status: DC
  Administered 2012-08-02: 02:00:00 via TOPICAL
  Filled 2012-08-02: qty 28

## 2012-08-02 MED ORDER — HYDROCORTISONE 1 % EX CREA: TOPICAL_CREAM | Freq: Three times a day (TID) | CUTANEOUS | Status: DC

## 2012-08-02 NOTE — ED Provider Notes (Signed)
History     CSN: [id number]  Arrival date & time 08/01/12  2335   First MD Initiated Contact with Patient 08/02/12 0041      Chief Complaint  Patient presents with  . Rash  . Diarrhea    (Consider location/radiation/quality/duration/timing/severity/associated sxs/prior treatment) HPI 37 year old male presents to emergency room complaining of rash to face and upper torso along with diarrhea. Symptoms started on Wednesday. Patient was placed on gabapentin on Monday for his fibromyalgia. She denies any new soaps, detergents, foods or other possible allergens. No prior history of similar symptoms. He has had 2 loose bowel movements each day since Wednesday. There is no blood or mucus in the bowel movements. He denies any abdominal pain. No unusual foods, no sick contacts. Patient took over-the-counter Benadryl once and did not help symptoms. Past Medical History  Diagnosis Date  . Anxiety   . Depression   . Sciatica of left side   . HIV infection     followed by Dr. Luciana Axe- sees him every 4 months  . Fibromyalgia muscle pain 02/11/2012    37 year old male with HIV and chronic neck and back neck pain who presents for follow up of neck pain. This problem has been persistent for several years and has been evaluated by Dr. Edmonia James, his previous PCP numerous times. He also brings it up as a concern at his appointment with the infectious disease doctors and has also presented to the ED for this problem approximately. He was prescribed M    Past Surgical History  Procedure Date  . Wisdom tooth extraction     Family History  Problem Relation Age of Onset  . Mental illness Neg Hx   . Hypertension Mother   . Diabetes Mother   . Stroke Mother     cerbral aneurysm  . Hypertension Brother     History  Substance Use Topics  . Smoking status: Current Every Day Smoker -- 0.5 packs/day for 15 years    Types: Cigarettes  . Smokeless tobacco: Never Used  . Alcohol Use: Yes     Comment:  every 2 weeks hard liquor      Review of Systems  See History of Present Illness; otherwise all other systems are reviewed and negative  Allergies  Bactrim; Cymbalta; Other; Sertraline hcl; Zoloft; and Neurontin  Home Medications   Current Outpatient Rx  Name  Route  Sig  Dispense  Refill  . DARUNAVIR ETHANOLATE 800 MG PO TABS   Oral   Take 1 tablet (800 mg total) by mouth daily.   30 tablet   11   . DULOXETINE HCL 60 MG PO CPEP   Oral   Take 1 capsule (60 mg total) by mouth daily.   30 capsule   1   . EMTRICITABINE-TENOFOVIR 200-300 MG PO TABS   Oral   Take 1 tablet by mouth daily.   30 tablet   11   . HYDROCORTISONE 1 % EX CREA   Topical   Apply topically 3 (three) times daily.   60 g   0   . HYDROXYZINE HCL 25 MG PO TABS   Oral   Take 1 tablet (25 mg total) by mouth 3 (three) times daily as needed for itching.   30 tablet   0   . IBUPROFEN 800 MG PO TABS   Oral   Take 1 tablet (800 mg total) by mouth 3 (three) times daily.   30 tablet   0   .  IMIQUIMOD 5 % EX CREA   Topical   Apply topically 3 (three) times a week.   12 each   1   . LISINOPRIL 20 MG PO TABS   Oral   Take 1 tablet (20 mg total) by mouth daily.   30 tablet   5   . OXYCODONE-ACETAMINOPHEN 5-325 MG PO TABS   Oral   Take 2 tablets by mouth every 4 (four) hours as needed for pain.   10 tablet   0   . RITONAVIR 100 MG PO TABS   Oral   Take 1 tablet (100 mg total) by mouth daily.   30 tablet   11     BP 147/99  Pulse 96  Temp 98.2 F (36.8 C)  Resp 20  SpO2 97%  Physical Exam  Nursing note and vitals reviewed. Constitutional: He is oriented to person, place, and time. He appears well-developed and well-nourished.  HENT:  Head: Normocephalic and atraumatic.  Nose: Nose normal.  Mouth/Throat: Oropharynx is clear and moist.  Eyes: Conjunctivae normal and EOM are normal. Pupils are equal, round, and reactive to light.  Neck: Normal range of motion. Neck supple. No JVD  present. No tracheal deviation present. No thyromegaly present.  Cardiovascular: Normal rate, regular rhythm, normal heart sounds and intact distal pulses.  Exam reveals no gallop and no friction rub.   No murmur heard. Pulmonary/Chest: Effort normal and breath sounds normal. No stridor. No respiratory distress. He has no wheezes. He has no rales. He exhibits no tenderness.  Abdominal: Soft. Bowel sounds are normal. He exhibits no distension and no mass. There is no tenderness. There is no rebound and no guarding.  Musculoskeletal: Normal range of motion. He exhibits no edema and no tenderness.  Lymphadenopathy:    He has no cervical adenopathy.  Neurological: He is alert and oriented to person, place, and time. He exhibits normal muscle tone. Coordination normal.  Skin: Skin is warm and dry. No rash noted. No erythema. No pallor.       Patient with scattered maculopapular lesions on face, upper torso. No urticaria, lesions are blanching. They are pruritic in nature.  Psychiatric: He has a normal mood and affect. His behavior is normal. Judgment and thought content normal.    ED Course  Procedures (including critical care time)  Labs Reviewed - No data to display No results found.   1. Rash   2. Diarrhea       MDM  37 year old male with new rash and loose stools. It is unclear if that is due to the gabapentin, but will have patient hold it until he sees his primary care Dr. Maryclare Labrador prescribe Atarax for itching and topical hydrocortisone.        Olivia Mackie, MD 08/02/12 [phone]

## 2012-08-02 NOTE — ED Notes (Signed)
Pt education given as to use of hydrocortisone cream; tube given to use at home as ordered by Dr. Norlene Campbell

## 2012-08-05 ENCOUNTER — Other Ambulatory Visit: Payer: Self-pay | Admitting: Licensed Clinical Social Worker

## 2012-08-05 ENCOUNTER — Telehealth: Payer: Self-pay | Admitting: *Deleted

## 2012-08-05 DIAGNOSIS — M797 Fibromyalgia: Secondary | ICD-10-CM

## 2012-08-05 DIAGNOSIS — B20 Human immunodeficiency virus [HIV] disease: Secondary | ICD-10-CM

## 2012-08-05 MED ORDER — DARUNAVIR ETHANOLATE 800 MG PO TABS: 800.0000 mg | ORAL_TABLET | Freq: Every day | ORAL | Status: DC

## 2012-08-05 NOTE — Telephone Encounter (Addendum)
Patient was recently Rx'd Neurontin for neuropathy.  Patient had allergic reaction to med---initially had itching and recently developed hives.  D/C'd med.  Social working calling wanting to know what is the next step since patient unable to take Neurontin.  Will route note to Dr. Clinton Sawyer for advice and call patient and social worker back.  Gaylene Brooks, RN

## 2012-08-07 NOTE — Telephone Encounter (Signed)
Informed Turkey (SW) of info from Dr. Clinton Sawyer.  SW wants to know if a rheumatology referral is appropriate.  If so, [facility] participates with [facility] to help with office visits.  Will route phone note to Dr. Clinton Sawyer and call patient back if rheumatology referral is ordered.   Gaylene Brooks, RN

## 2012-08-07 NOTE — Telephone Encounter (Signed)
I don't know the next step. We have tried numerous therapies and unfortunately none have been successful. I do not have any other evaluation or treatment plans at this time. The only thing that has been shown to reduce fibromylagia pain is consistent exercise, which I have discussed with Phillip Gill. If he is having persistent hives, then he needs to be examined in clinic.

## 2012-08-07 NOTE — Telephone Encounter (Signed)
Returned call to Turkey at [facility] and left message to call our office back.  Gaylene Brooks, RN

## 2012-08-08 NOTE — Telephone Encounter (Addendum)
I spoke with [place], CSW at [facility] who has been working with Phillip Gill. She has been trying to coordinate his care. She informed me that he has a mental health provider, which is [facility] [place]. He has already seen a counselor and has an upcoming appointment with an NP. She also inquired about a referral to [facility] for a rheumatologist. I don't her that I am considering it but don't have plans to do it immediately. He has not been compliant with regular exercise, which is the mainstay of his treatment. She will encourage him to be compliant with this so we may more accurately assess him later.

## 2012-09-05 ENCOUNTER — Ambulatory Visit: Payer: No Typology Code available for payment source | Admitting: Family Medicine

## 2012-09-11 ENCOUNTER — Emergency Department (HOSPITAL_COMMUNITY)
Admission: EM | Admit: 2012-09-11 | Discharge: 2012-09-12 | Discharge status: Against Medical Advice | Payer: No Typology Code available for payment source | Attending: Emergency Medicine | Admitting: Emergency Medicine

## 2012-09-11 ENCOUNTER — Encounter (HOSPITAL_COMMUNITY): Payer: Self-pay | Admitting: Adult Health

## 2012-09-11 DIAGNOSIS — R42 Dizziness and giddiness: Secondary | ICD-10-CM | POA: Insufficient documentation

## 2012-09-11 LAB — GLUCOSE, CAPILLARY: Glucose-Capillary: 138 mg/dL — ABNORMAL HIGH (ref 70–99)

## 2012-09-11 NOTE — ED Notes (Addendum)
Pt reports sudden onset of dizziness that began one hour ago associated with feeling like "my legs are heavy"  Dizziness gets worse when eyes are closed, being still makes dizziness better. Denies nausea. Denies pain. Pt is alert and oriented, PERRLA.  Pt states dizziness started "while i was under a lot of stress and drama"

## 2012-09-17 ENCOUNTER — Encounter: Payer: Self-pay | Admitting: Family Medicine

## 2012-09-17 ENCOUNTER — Ambulatory Visit (INDEPENDENT_AMBULATORY_CARE_PROVIDER_SITE_OTHER): Payer: No Typology Code available for payment source | Admitting: Family Medicine

## 2012-09-17 VITALS — BP 150/103 | HR 98 | Ht 71.0 in | Wt 210.0 lb

## 2012-09-17 DIAGNOSIS — IMO0001 Reserved for inherently not codable concepts without codable children: Secondary | ICD-10-CM

## 2012-09-17 DIAGNOSIS — R51 Headache: Secondary | ICD-10-CM

## 2012-09-17 DIAGNOSIS — M797 Fibromyalgia: Secondary | ICD-10-CM

## 2012-09-17 DIAGNOSIS — I10 Essential (primary) hypertension: Secondary | ICD-10-CM

## 2012-09-17 MED ORDER — PROPRANOLOL HCL 40 MG PO TABS: 40.0000 mg | ORAL_TABLET | Freq: Two times a day (BID) | ORAL | Status: DC

## 2012-09-17 NOTE — Patient Instructions (Addendum)
Please start taking propranolol 40 mg twice a day for your headache and also blood pressure. Please follow up in 1 month so I can see how your headache is doing. I will write a letter for school that can be picked up on Friday.   Take Care,   Dr. Clinton Sawyer

## 2012-09-17 NOTE — Progress Notes (Signed)
  Subjective:    Patient ID: Phillip Gill, male    DOB: 1975-10-16, 37 y.o.   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   Fibromyalgia: - Patient started on Neurontin at last visit, but stopped due to hives - Since that time has not been on any treatment - No regular exercise and has not gone back to work - Pain unchanged from previous - located in neck, back, and extremities - Has appointment tomorrow with Rheumatology Department at [facility] tomorrow   HEADACHE: Location: frontal and pareital Quality: throbbing Duration of headache without treatment: several hours Current Frequency: almost daily Longest duration without headache: Accompanying symptoms: no nausea, no vomiting, yes photophobia, yes phonophobia, yes lacrimation, yes rhinorrhea but at other times as well, yes worsened with activity, no neurologic symptoms Effective treatment: sleeping Ineffective treatment: none  History of headaches: history of migraines History of imaging: CT scan in June 2013 - Heterogeneous density to the vertebral bodies without cortical breakthrough. This is nonspecific however consider an MRI follow-up to exclude myeloproliferative disorders Known triggers: unknown, patient denies that tight  History of headache journal: no    Review of Systems See HPI    Objective:   Physical Exam BP 150/103  Pulse 98  Ht 5\' 11"  (1.803 m)  Wt 210 lb (95.255 kg)  BMI 29.3 kg/m2 Gen: AAM, uncomfortable but no ill appearing, pleasant and conversant HEENT: NCAT, PERRLA, wearing tight dew rag, EOMI, OP clear and moist Neuro: CN II-XII grossly intact  MSK: generalized tenderness throughout entire cervical spinal muscle, thoracic and lumbar spinal muscles and upper extremities     Assessment & Plan:

## 2012-09-18 DIAGNOSIS — B1910 Unspecified viral hepatitis B without hepatic coma: Secondary | ICD-10-CM | POA: Insufficient documentation

## 2012-09-18 DIAGNOSIS — G894 Chronic pain syndrome: Secondary | ICD-10-CM | POA: Insufficient documentation

## 2012-09-18 DIAGNOSIS — R519 Headache, unspecified: Secondary | ICD-10-CM | POA: Insufficient documentation

## 2012-09-18 NOTE — Assessment & Plan Note (Signed)
Patient with chronic pain syndrome as well as depression, so this is possibly related to these issues. He fits DM-IV criteria for somatization disorder. Overall, this appears to be a primary headache given normal exam and quality. It also has certain characteristics of cluster headaches. At this point, I will treat with propranolol to determine if this improves the headaches as well as BP. F/u in 2-3 months.

## 2012-09-18 NOTE — Assessment & Plan Note (Signed)
Poorly controlled on lisinopril 20 mg. Added propranolol 40 mg day with hope for improved BP control and headache prevention. Consider adding CCB next if needed.

## 2012-09-18 NOTE — Assessment & Plan Note (Signed)
Not on any medication and condition unchanged. Patient plans for evaluation at [facility] tomorrow. I did not schedule this visit for him, because he was not previously compliant with exercise treatment. It must has been arranged by mental health provider. I look forward to following up their assessment.

## 2012-10-06 ENCOUNTER — Telehealth: Payer: Self-pay | Admitting: *Deleted

## 2012-10-06 ENCOUNTER — Encounter: Payer: Self-pay | Admitting: Family Medicine

## 2012-10-06 ENCOUNTER — Ambulatory Visit (INDEPENDENT_AMBULATORY_CARE_PROVIDER_SITE_OTHER): Payer: No Typology Code available for payment source | Admitting: Family Medicine

## 2012-10-06 VITALS — BP 134/96 | HR 87 | Ht 71.0 in | Wt 210.0 lb

## 2012-10-06 DIAGNOSIS — R51 Headache: Secondary | ICD-10-CM

## 2012-10-06 LAB — CBC
Hemoglobin: 14 g/dL (ref 13.0–17.0)
MCHC: 32.9 g/dL (ref 30.0–36.0)
RBC: 4.9 MIL/uL (ref 4.22–5.81)
WBC: 8.6 10*3/uL (ref 4.0–10.5)

## 2012-10-06 LAB — POCT SEDIMENTATION RATE: POCT SED RATE: 17 mm/hr (ref 0–22)

## 2012-10-06 LAB — BASIC METABOLIC PANEL
CO2: 33 mEq/L — ABNORMAL HIGH (ref 19–32)
Calcium: 9.8 mg/dL (ref 8.4–10.5)
Creat: 1.23 mg/dL (ref 0.50–1.35)

## 2012-10-06 LAB — POCT GLYCOSYLATED HEMOGLOBIN (HGB A1C): Hemoglobin A1C: 5.4

## 2012-10-06 NOTE — Patient Instructions (Addendum)
Labs today  Follow-up with Dr. Clinton Sawyer in 1 week  Try Tylenol 650 mg every 8 hours as needed (maximum dose 3000 mg a day) You may also try ibuprofen 600 mg every 6 hours as needed if the Tylenol is not helping   If your lab work is normal, please make an appointment to talk about your urinary problems.

## 2012-10-06 NOTE — Assessment & Plan Note (Addendum)
Nonspecific headaches associated with diffuse body tenderness D/Dx: tension headache, association with depression/chronic pain/somatization disorder; he is HIV positive but well controlled, but would monitor closely for possibility for CNS-type lesions -He appears depressed. He is followed by a psychiatrist, had been diagnosed with depression but is on Risperdal, which he says is just for depression -Propranolol is helping him but has not helped as much past 2 weeks but it is keeping his blood pressure under better control -Try OTC analgesics prn for headaches -I do not think he warrants MRI to evaluate for CNS lesions at this time due to absence of focal symptoms; but this was discussed with him and his roommate who accompanied him; if any focal signs advised to go to ED -Keep follow-up appointment with PCP in 2 weeks; if worsening symptoms RTC sooner or go to ED -Check A1c (random glucose elevated last check) due to complaints of urinary frequency and nocturia; check BMET, sed rate, CBC>>>all labs WNL except bicarb very mildly elevated 33 (normal range up to 32). He was notified that labs will be discussed at follow-up if normal.

## 2012-10-06 NOTE — Telephone Encounter (Signed)
Patient in the office for his partner's visit, reports feeling "weak, dizzy since the weekend" to the front desk.  Triage RN came to speak with patient, took bp (137/97).  Patient reports no change in medication since his last PCP visit.  After chart review, patient's PCP is actively trying to control his HTN.  Patient advised to call his PCP and see if they can work him into the schedule this week.  Patient and his partner advised of signs/symptoms that would indicate a emergency room visit (slurred speech, sharp/sudden/blinding headache, facial asymmetry; chest pain, SOB, left arm pain, N/V).  Patient and partner verbalized understanding and agreement with the plan. Andree Coss, RN

## 2012-10-06 NOTE — Progress Notes (Signed)
  Subjective:    Patient ID: Phillip Gill, male    DOB: [birth year], 37 y.o.   MRN: [id number]  HPI # SDA. His blood pressure is causing him to have dizzy spells, lightheadedness, headaches.  He guesses it is his blood pressure.   His has a headache currently. It is a sharp pain between his eyes and forehead. His current headache has been going on for 3-4 hours. He has had problems with headaches for the past 2 months. It is a 6/10 at this time. Prior to 2 months ago, he did not have headaches like this.  Frequency: daily Location: changes, sometimes behind ears, under eyes Associated symptoms: sometimes with lightheadedness; he denies association association with eye tearing, facial pain, runny nose  Alleviated by: see below Medication:  Started on propranolol 3/19. Twice a day. After he started taking it, his headaches had resolved but a few days ago his headaches returned.   Blood pressures at home: SBP 180s, DBP 110-120s  Review of Systems Endorses nausea this morning without vomiting; headaches not usually associated with nausea Denies fevers, chills Endorses occasional blurry vision with headaches  Denies vision changes  Denies extremity weakness, tingling  Allergies, medication, past medical history reviewed.  Smoking status noted. HIV--Dr. Comer follows; he was told it has been controlled  Depression--Risperdal per Dr. Flora Lipps (psychiatrist); for the past 1-2 months; Dr. Flora Lipps had recently increased dose to 2 mg      Objective:   Physical Exam GEN: NAD PSYCH: appears depressed CV: RRR, no m/r/g PULM: NI WOB; CTAB without w/r/r NEURO: no focal deficits; moves all extremities well; 4+ popliteal reflexes MSK: tender in arms and legs, mild, diffuse NECK: good ROM, supple, though tender to palpation, similar intensity of above MSK     Assessment & Plan:

## 2012-10-06 NOTE — Addendum Note (Signed)
Addended by: [place], Turkessa Ostrom on: 10/06/2012 05:05 PM   Modules accepted: Orders

## 2012-10-20 ENCOUNTER — Encounter: Payer: Self-pay | Admitting: Family Medicine

## 2012-10-20 ENCOUNTER — Ambulatory Visit (INDEPENDENT_AMBULATORY_CARE_PROVIDER_SITE_OTHER): Payer: No Typology Code available for payment source | Admitting: Family Medicine

## 2012-10-20 VITALS — BP 134/91 | HR 74 | Ht 71.0 in | Wt 206.6 lb

## 2012-10-20 DIAGNOSIS — R51 Headache: Secondary | ICD-10-CM

## 2012-10-20 DIAGNOSIS — G43909 Migraine, unspecified, not intractable, without status migrainosus: Secondary | ICD-10-CM

## 2012-10-20 DIAGNOSIS — I10 Essential (primary) hypertension: Secondary | ICD-10-CM

## 2012-10-20 MED ORDER — SUMATRIPTAN SUCCINATE 25 MG PO TABS: 25.0000 mg | ORAL_TABLET | Freq: Every day | ORAL | Status: DC | PRN

## 2012-10-20 NOTE — Patient Instructions (Signed)
I am glad to see that you are doing better. Please take the Imitrex pills a needed. I will have to think about the incontinence issues.   Please return in 4-6 weeks for a check up.   Dr. Clinton Sawyer

## 2012-10-20 NOTE — Progress Notes (Signed)
  Subjective:    Patient ID: Phillip Gill, male    DOB: Sep 23, 1975, 37 y.o.   MRN: [id number]  HPI  37 year old M with HIV, HTN, and chronic pain syndrome who presents for follow up a persistent headache and hypertension.   Hypertension  Home BP monitoring:  BP Readings from Last 3 Encounters:  10/20/12 134/91  10/06/12 134/96  09/17/12 150/103    Prescribed meds: Lisinopril 20 mg daily, propranolol 40 mg BID  Hypertension ROS: taking medications as instructed, no medication side effects noted, no TIA's, no chest pain on exertion, no dyspnea on exertion and no swelling of ankles   HEADACHE: Recent History: started on propranolol for headache prevention/HTN control  09/17/12, since that time it has improved and doesn't occur everyday as before  Location: frontal and parietal Quality: throbbing  Duration of headache without treatment: 2 hours  Current Frequency: almost daily  Longest duration without headache:  Accompanying symptoms: no nausea, no vomiting, yes photophobia, yes phonophobia, yes lacrimation, yes rhinorrhea but at other times as well, yes worsened with activity, no neurologic symptoms  Effective treatment: sleeping  Ineffective treatment: none  History of headaches: history of migraines  History of imaging: CT scan in June 2013 - Heterogeneous density to the vertebral bodies without cortical breakthrough. This is nonspecific however consider an MRI follow-up to exclude myeloproliferative disorders  Known triggers: unknown, patient denies that tight  History of headache journal: no   Fibromyalgia: Patient seen rheumatology clinic at [facility] who referred him to a pain management center in [place]; Has not been able to set up appointment yet, but would like to be seen in [place]  Review of Systems Decreased depression; still with persistent neck and back pain     Objective:   Physical Exam BP 134/91  Pulse 74  Ht 5\' 11"  (1.803 m)  Wt 206 lb 9.6 oz  (93.713 kg)  BMI 28.83 kg/m2 Gen: well appearing, non distressed, smiling more today than any previous visit; accompanied by partner Head: normocephalic, atraumatic, no palpable areas of tenderness  CV: RRR, no murmurs, rubs or gallops Neuro: CN II-XII intact, no papilledema     Assessment & Plan:  Improved BP control; moderate improvement in headache control

## 2012-10-24 ENCOUNTER — Telehealth: Payer: Self-pay | Admitting: Family Medicine

## 2012-10-24 NOTE — Telephone Encounter (Signed)
Patient is calling because the [facility] doesn't carry Imitrex so the patient would like to substitute that medication for something else that the [facility] does carry.  There is a request in Dr. Freada Bergeron box.

## 2012-10-26 NOTE — Assessment & Plan Note (Signed)
Improved control; Cont propranolol 40 mg BID and lisinopril 20 mg daily; Increase lisinopril as next step if needed

## 2012-10-26 NOTE — Assessment & Plan Note (Signed)
Cont propranolol 40 mg daily and start a triptan PRN for severe headache

## 2012-10-27 ENCOUNTER — Other Ambulatory Visit: Payer: Self-pay | Admitting: Family Medicine

## 2012-10-27 DIAGNOSIS — G43909 Migraine, unspecified, not intractable, without status migrainosus: Secondary | ICD-10-CM

## 2012-10-27 MED ORDER — RIZATRIPTAN BENZOATE 10 MG PO TABS: 5.0000 mg | ORAL_TABLET | ORAL | Status: DC | PRN

## 2012-10-27 NOTE — Telephone Encounter (Signed)
Rx request for alternative medicine signed and faxed back to [facility] on Friday 10/24/12.

## 2012-10-29 ENCOUNTER — Telehealth: Payer: Self-pay | Admitting: Family Medicine

## 2012-10-29 NOTE — Telephone Encounter (Signed)
Pt calls inquiring about his maxalt at the [facility]. I called pharmacy and they have RX and will call pt when he needs to come in and sign form for the MAP program and that it could take 6 wk but that they may be able to give pt samples to tide him over until it comes in. Pt is notified and voiced understanding.

## 2012-10-30 ENCOUNTER — Other Ambulatory Visit: Payer: Self-pay | Admitting: Internal Medicine

## 2012-11-04 ENCOUNTER — Other Ambulatory Visit (INDEPENDENT_AMBULATORY_CARE_PROVIDER_SITE_OTHER): Payer: No Typology Code available for payment source

## 2012-11-04 DIAGNOSIS — B20 Human immunodeficiency virus [HIV] disease: Secondary | ICD-10-CM

## 2012-11-04 LAB — CBC WITH DIFFERENTIAL/PLATELET
Basophils Absolute: 0 10*3/uL (ref 0.0–0.1)
Basophils Relative: 0 % (ref 0–1)
Eosinophils Relative: 1 % (ref 0–5)
HCT: 38.4 % — ABNORMAL LOW (ref 39.0–52.0)
MCHC: 34.9 g/dL (ref 30.0–36.0)
Monocytes Absolute: 0.8 10*3/uL (ref 0.1–1.0)
Neutro Abs: 3 10*3/uL (ref 1.7–7.7)
Platelets: 140 10*3/uL — ABNORMAL LOW (ref 150–400)
RDW: 14.2 % (ref 11.5–15.5)

## 2012-11-05 LAB — T-HELPER CELL (CD4) - (RCID CLINIC ONLY)
CD4 % Helper T Cell: 34 % (ref 33–55)
CD4 T Cell Abs: 1030 uL (ref 400–2700)

## 2012-11-05 LAB — COMPLETE METABOLIC PANEL WITH GFR
AST: 24 U/L (ref 0–37)
Alkaline Phosphatase: 75 U/L (ref 39–117)
BUN: 8 mg/dL (ref 6–23)
Creat: 1.12 mg/dL (ref 0.50–1.35)
Potassium: 3.9 mEq/L (ref 3.5–5.3)

## 2012-11-05 LAB — RPR

## 2012-11-06 LAB — HIV-1 RNA QUANT-NO REFLEX-BLD: HIV-1 RNA Quant, Log: <1.3 {Log} (ref <1.30)

## 2012-11-17 ENCOUNTER — Other Ambulatory Visit: Payer: Self-pay

## 2012-11-18 ENCOUNTER — Ambulatory Visit: Payer: Self-pay | Admitting: Internal Medicine

## 2012-11-19 ENCOUNTER — Ambulatory Visit (INDEPENDENT_AMBULATORY_CARE_PROVIDER_SITE_OTHER): Payer: No Typology Code available for payment source | Admitting: Family Medicine

## 2012-11-19 ENCOUNTER — Encounter: Payer: Self-pay | Admitting: Family Medicine

## 2012-11-19 VITALS — BP 135/90 | HR 93 | Temp 99.7°F | Wt 212.0 lb

## 2012-11-19 DIAGNOSIS — L03211 Cellulitis of face: Secondary | ICD-10-CM | POA: Insufficient documentation

## 2012-11-19 DIAGNOSIS — L0201 Cutaneous abscess of face: Secondary | ICD-10-CM

## 2012-11-19 MED ORDER — CLINDAMYCIN HCL 300 MG PO CAPS: 300.0000 mg | ORAL_CAPSULE | Freq: Four times a day (QID) | ORAL | Status: DC

## 2012-11-19 NOTE — Progress Notes (Signed)
Subjective:     Patient ID: RAYBON CONARD, male   DOB: [birth year], 37 y.o.   MRN: [id number]  HPI 37 yo M with HIV presents with his long term friend for the following:  1. Chin swelling: started 3 days ago with a small pustules on inferior chin. Has spread anteriorly to involve redness, swelling or pain. Pain is anterior chin. Pain is worse when he opens his mouth. No fever or difficulty swallowing. Treating with alcohol. No recent antibiotics. Compliant with anti-retrovirals.  Recent CD4 1030 on 11/04/12.   Review of Systems As per HPI    Objective:   Physical Exam   BP 135/90  Pulse 93  Temp(Src) 99.7 F (37.6 C) (Oral)  Wt 212 lb (96.163 kg)  BMI 29.58 kg/m2 General appearance: alert, appears stated age and no distress Skin: swelling and erythema anterior chin about 2.5x2 cm area. yellow crusting with clear drainage. Area is slightly fluctuant.  Assessment and Plan:

## 2012-11-19 NOTE — Patient Instructions (Signed)
Mr. Bruun,  Thank you for coming in today. You have a cellulitis with possible small abscess. Since you are having some drainage I will not incise it today. Do start clindamycin 300 mg every 6 hrs. Clindamycin may cause diarrhea or loose stools.  Do apply cool compress.   Do keep f/u wth Dr. Clinton Sawyer for next week.  If pain is worsening, you develop fever or the area becomes larger and more tender do call and come back sooner.   Dr. Armen Pickup

## 2012-11-19 NOTE — Assessment & Plan Note (Signed)
A: cellulitis of face. Patient at risk for MRSA given HIV status. Patient may need I&D depending on how he responds to oral autobiotic therapy. P: Clindamycin 300 mg every 6 hrs for 7-10 days Close f/u in 6 days Reviewed s/s to prompt return to care, see AVS.

## 2012-11-22 LAB — WOUND CULTURE: Gram Stain: NONE SEEN

## 2012-11-25 ENCOUNTER — Ambulatory Visit (INDEPENDENT_AMBULATORY_CARE_PROVIDER_SITE_OTHER): Payer: No Typology Code available for payment source | Admitting: Family Medicine

## 2012-11-25 ENCOUNTER — Encounter: Payer: Self-pay | Admitting: Family Medicine

## 2012-11-25 VITALS — BP 132/92 | HR 75 | Ht 72.0 in | Wt 210.0 lb

## 2012-11-25 DIAGNOSIS — L03211 Cellulitis of face: Secondary | ICD-10-CM

## 2012-11-25 DIAGNOSIS — R51 Headache: Secondary | ICD-10-CM

## 2012-11-25 DIAGNOSIS — L0201 Cutaneous abscess of face: Secondary | ICD-10-CM

## 2012-11-25 DIAGNOSIS — M797 Fibromyalgia: Secondary | ICD-10-CM

## 2012-11-25 DIAGNOSIS — IMO0001 Reserved for inherently not codable concepts without codable children: Secondary | ICD-10-CM

## 2012-11-25 DIAGNOSIS — I10 Essential (primary) hypertension: Secondary | ICD-10-CM

## 2012-11-25 MED ORDER — PROPRANOLOL HCL 40 MG PO TABS: 40.0000 mg | ORAL_TABLET | Freq: Every day | ORAL | Status: DC

## 2012-11-25 NOTE — Progress Notes (Signed)
  Subjective:    Patient ID: Phillip Gill, male    DOB: Oct 07, 1975, 37 y.o.   MRN: [id number]  HPI  37 year old M with well controlled HIV, chronic pain syndrome, depression, hypertension and recent diagnosis of cellulitis presenting for follow up. He is accompanied by his friend.    Cellulitis:  >Located on chin >Diagnosed 11/19/12 and started on Clindamycin >Reports significant improvement in swelling and redness >Denies profuse water diarrhea   Hypertension  Home BP monitoring:   Office BP: BP Readings from Last 3 Encounters:  11/25/12 132/92  11/19/12 135/90  10/20/12 134/91    Prescribed meds: Lisinopril 20 mg daily, Propranolol 40 mg BID  Hypertension ROS:  >Taking medications as prescribed:No - taking propranolol only once a day since he believes that this is causing fatigue and decreased libido, which was also supported by his mental health provider >Chest pain: No >Shortness of breath: No >Swelling of extremities: No >TIA symptoms: No  Headache - Chronic daily with occasional migraine  > Recently evaluated for history of chronic migraine like headache and told to start rizatriptan PRN for severe headaches and continue propranolol 40 BID for prevention  > Taking propranolol once daily, has needed rizatriptan only twice, taking excedrin  which has been very effective for his headaches   Social - Patient recently started living on his own after his partner broke up with him. His ex-partner who accompanies him to all his visits notes that Mr. Benassi has had a difficulty time adjusting to this since he is very dependent and has anxiety   Review of Systems Positive for MSK pain, nocturnal enuresis, fatigue, decreased libido    Objective:   Physical Exam BP 132/92  Pulse 75  Ht 6' (1.829 m)  Wt 210 lb (95.255 kg)  BMI 28.47 kg/m2 Gen: well appearing AAM, no distressed HEENT: cerumen impacted in left ear canal , no erythema or drainage, no otalgia on  exam CV: RRR, no murmurs, rubs or gallops  Pulm: CTA-B     Assessment & Plan:

## 2012-11-25 NOTE — Patient Instructions (Addendum)
Phillip Gill,   Thank you for coming in today. I think that the propranolol has been contributing to your fatigue and decreased sex drive. Please decrease your dose to 40 mg per day and follow up in 4 weeks to see how that does. We can stop it if needed.   In regards to the ear pain, please use peroxide three times day for three days followed by washing out with paper towel. If this starts to have drainage or severe pain, then please let me know.   I will look into pain clinic options for you with the orange card. I will see you in 4 weeks.   Sincerely,   Dr. Clinton Sawyer

## 2012-11-27 NOTE — Assessment & Plan Note (Signed)
Well controlled. Continue current regimen of propranolol daily, excedrin PRN, and rizatriptan PRN for severe headache.

## 2012-11-27 NOTE — Assessment & Plan Note (Signed)
Told to complete 7 days of clindamycin and then stop. Return if profuse watery diarrhea.

## 2012-11-27 NOTE — Assessment & Plan Note (Signed)
Patient requesting referral to pain center for fibromyalgia b/c orthopedic office could not complete this. Will defer topic to next visit Also, need to obtain records from the orthopedic surgeon.

## 2012-11-27 NOTE — Assessment & Plan Note (Signed)
Moderate control. Consider increase ACE-I at next visit as propranolol likely not contributing at such a low dose.

## 2012-12-01 ENCOUNTER — Other Ambulatory Visit: Payer: Self-pay

## 2012-12-01 DIAGNOSIS — I10 Essential (primary) hypertension: Secondary | ICD-10-CM

## 2012-12-01 MED ORDER — LISINOPRIL 20 MG PO TABS: 20.0000 mg | ORAL_TABLET | Freq: Every day | ORAL | Status: DC

## 2012-12-02 ENCOUNTER — Other Ambulatory Visit: Payer: Self-pay | Admitting: Licensed Clinical Social Worker

## 2012-12-02 ENCOUNTER — Ambulatory Visit (INDEPENDENT_AMBULATORY_CARE_PROVIDER_SITE_OTHER): Payer: No Typology Code available for payment source | Admitting: Internal Medicine

## 2012-12-02 ENCOUNTER — Ambulatory Visit: Payer: Self-pay | Admitting: Internal Medicine

## 2012-12-02 ENCOUNTER — Encounter: Payer: Self-pay | Admitting: Internal Medicine

## 2012-12-02 VITALS — BP 136/99 | HR 81 | Temp 97.5°F | Ht 72.0 in | Wt 208.0 lb

## 2012-12-02 DIAGNOSIS — L03211 Cellulitis of face: Secondary | ICD-10-CM

## 2012-12-02 DIAGNOSIS — I10 Essential (primary) hypertension: Secondary | ICD-10-CM

## 2012-12-02 DIAGNOSIS — B20 Human immunodeficiency virus [HIV] disease: Secondary | ICD-10-CM

## 2012-12-02 DIAGNOSIS — N3944 Nocturnal enuresis: Secondary | ICD-10-CM

## 2012-12-02 DIAGNOSIS — L0201 Cutaneous abscess of face: Secondary | ICD-10-CM

## 2012-12-02 MED ORDER — LISINOPRIL 20 MG PO TABS: 20.0000 mg | ORAL_TABLET | Freq: Every day | ORAL | Status: DC

## 2012-12-02 MED ORDER — IMIQUIMOD 5 % EX CREA: TOPICAL_CREAM | CUTANEOUS | Status: DC

## 2012-12-02 NOTE — Assessment & Plan Note (Signed)
This has been ongoing for more than a year. No concerning signs of infection. He will discuss this with his counselor.

## 2012-12-02 NOTE — Assessment & Plan Note (Signed)
Continue his current regimen and return to clinic in 4 months.

## 2012-12-02 NOTE — Progress Notes (Signed)
  Subjective:    Patient ID: Phillip Gill, male    DOB: 17-Mar-1976, 37 y.o.   MRN: [id number]  HPI Here for followup of HIV. On darunavir, Norvir and Truvada. Denies any missed doses. Labs with a viral load that is undetectable and CD4 count over 1000. Recently saw his primary physician with facial cellulitis which is now resolved. He is having a little diarrhea on the clindamycin but no weight loss and more consistent with loose stools rather than frequent diarrhea. Has 2 perianal warts that have come back and asking for Aldara.   His friend who is in the room with him (his former partner), brings up that he has had problems with bedwetting. No concerning signs on exam or labs.   Review of Systems  Constitutional: Negative for fatigue and unexpected weight change.  HENT: Negative for sore throat and trouble swallowing.   Gastrointestinal: Negative for nausea and diarrhea.  Skin: Negative for rash.  Neurological: Negative for dizziness and headaches.  Psychiatric/Behavioral: Negative for dysphoric mood.       Objective:   Physical Exam  Constitutional: He appears well-developed and well-nourished. No distress.  HENT:  Mouth/Throat: Oropharynx is clear and moist. No oropharyngeal exudate.  Cardiovascular: Normal rate, regular rhythm and normal heart sounds.  Exam reveals no gallop and no friction rub.   No murmur heard. Pulmonary/Chest: Effort normal and breath sounds normal. No respiratory distress. He has no wheezes. He has no rales.  Lymphadenopathy:    He has no cervical adenopathy.          Assessment & Plan:

## 2012-12-02 NOTE — Assessment & Plan Note (Signed)
This has now resolved.  

## 2012-12-24 ENCOUNTER — Ambulatory Visit: Payer: Self-pay | Admitting: Internal Medicine

## 2012-12-25 ENCOUNTER — Ambulatory Visit (INDEPENDENT_AMBULATORY_CARE_PROVIDER_SITE_OTHER): Payer: Self-pay | Admitting: Internal Medicine

## 2012-12-25 ENCOUNTER — Encounter: Payer: Self-pay | Admitting: Internal Medicine

## 2012-12-25 VITALS — BP 140/96 | HR 76 | Temp 98.2°F | Ht 72.0 in | Wt 210.0 lb

## 2012-12-25 DIAGNOSIS — A630 Anogenital (venereal) warts: Secondary | ICD-10-CM

## 2012-12-25 NOTE — Progress Notes (Signed)
  Subjective:    Patient ID: Phillip Gill, male    DOB: June 27, 1976, 37 y.o.   MRN: [id number]  HPI He comes in for work in visit due to perianal warts. This has been a chronic issue but his partner has noted having more warts that extend internally. None significantly large. He has tried Aldara but have not been able to reach some of them.   Review of Systems  Genitourinary:       Wart       Objective:   Physical Exam  Skin:  Anal warts with some extension into the anal canal          Assessment & Plan:

## 2012-12-25 NOTE — Assessment & Plan Note (Signed)
I will have him evaluated by general surgery to see if they can visualize and manage them

## 2012-12-26 ENCOUNTER — Ambulatory Visit: Payer: Self-pay | Admitting: Family Medicine

## 2013-01-05 ENCOUNTER — Encounter: Payer: Self-pay | Admitting: Family Medicine

## 2013-01-05 ENCOUNTER — Ambulatory Visit (INDEPENDENT_AMBULATORY_CARE_PROVIDER_SITE_OTHER): Payer: Self-pay | Admitting: Family Medicine

## 2013-01-05 VITALS — BP 132/88 | HR 101 | Temp 99.3°F | Ht 72.0 in | Wt 213.4 lb

## 2013-01-05 DIAGNOSIS — M25551 Pain in right hip: Secondary | ICD-10-CM

## 2013-01-05 DIAGNOSIS — M25559 Pain in unspecified hip: Secondary | ICD-10-CM

## 2013-01-05 DIAGNOSIS — I10 Essential (primary) hypertension: Secondary | ICD-10-CM

## 2013-01-05 NOTE — Progress Notes (Signed)
  Subjective:    Patient ID: Phillip Gill, male    DOB: 1976/06/25, 37 y.o.   MRN: [id number]  HPI  37 year old M with HIV, chronic pain syndrome, and HTN who presents for follow up.   Hypertension  Home BP monitoring:   Office BP: BP Readings from Last 3 Encounters:  01/05/13 132/88  12/25/12 140/96  12/02/12 136/99    Prescribed meds: lisinopril 40 mg daily, Propranolol 40 mg daily   Hypertension ROS:  Taking medications as prescribed:Yes Chest pain: no Shortness of breath: No Swelling of extremities: No TIA symptoms: No Other: improved fatigue with reduced dose of propranolol   Right Hip Pain Location: bilateral out hip, right worse than left Duration: 2 weeks Course: staying same Exacerbating: walking Alleviating: nothing tried  PMH - fibromyalgia, no history of injury or surgery to area, no hip imaging    Review of Systems See HPI      Objective:   Physical Exam BP 132/88  Pulse 101  Temp(Src) 99.3 F (37.4 C) (Oral)  Ht 6' (1.829 m)  Wt 213 lb 6.4 oz (96.798 kg)  BMI 28.94 kg/m2  Gen: well appearring, non distressed  CV: RRR, no murmurs Abd: no carotid bruits Hips: normal appearance right hip pain point tenderness over bursa, limited IT band flexibility on right, minimal left hip tenderness,negative Faber, normal internal and external passive rotation of hip, 5/5 strength of LE bilaterally      Assessment & Plan:

## 2013-01-05 NOTE — Patient Instructions (Addendum)
Dear Phillip Gill,   It is good to see you today. Please read below.   1. Blood pressure - It is good today. Please continue with your lisinopril and propranolol once daily.   2. Hip Pain - I believe that you have trochanteric bursitis, which you can read about below. You should ice your hip twice a day and use ibuprofen 400-600 mg 2-3 times per day. Make sure to take that with food. If it is not improved in 4 weeks, then we can try a steroid injection of the bursa on the right hip.   Please return in 4 weeks.   Sincerely,  Dr. Clinton Sawyer  Trochanteric Bursitis You have hip pain due to trochanteric bursitis. Bursitis means that the sack near the outside of the hip is filled with fluid and inflamed. This sack is made up of protective soft tissue. The pain from trochanteric bursitis can be severe and keep you from sleep. It can radiate to the buttocks or down the outside of the thigh to the knee. The pain is almost always worse when rising from the seated or lying position and with walking. Pain can improve after you take a few steps. It happens more often in people with hip joint and lumbar spine problems, such as arthritis or previous surgery. Very rarely the trochanteric bursa can become infected, and antibiotics and/or surgery may be needed. Treatment often includes an injection of local anesthetic mixed with cortisone medicine. This medicine is injected into the area where it is most tender over the hip. Repeat injections may be necessary if the response to treatment is slow. You can apply ice packs over the tender area for 30 minutes every 2 hours for the next few days. Anti-inflammatory and/or narcotic pain medicine may also be helpful. Limit your activity for the next few days if the pain continues. See your caregiver in 5-10 days if you are not greatly improved.  SEEK IMMEDIATE MEDICAL CARE IF:  You develop severe pain, fever, or increased redness.  You have pain that radiates below the  knee. EXERCISES STRETCHING EXERCISES - Trochantic Bursitis  These exercises may help you when beginning to rehabilitate your injury. Your symptoms may resolve with or without further involvement from your physician, physical therapist or athletic trainer. While completing these exercises, remember:   Restoring tissue flexibility helps normal motion to return to the joints. This allows healthier, less painful movement and activity.  An effective stretch should be held for at least 30 seconds.  A stretch should never be painful. You should only feel a gentle lengthening or release in the stretched tissue. STRETCH  Iliotibial Band  On the floor or bed, lie on your side so your injured leg is on top. Bend your knee and grab your ankle.  Slowly bring your knee back so that your thigh is in line with your trunk. Keep your heel at your buttocks and gently arch your back so your head, shoulders and hips line up.  Slowly lower your leg so that your knee approaches the floor/bed until you feel a gentle stretch on the outside of your thigh. If you do not feel a stretch and your knee will not fall farther, place the heel of your opposite foot on top of your knee and pull your thigh down farther.  Hold this stretch for ____15______ seconds.  Repeat ____5______ times. Complete this exercise _____3_____ times per day. STRETCH Hamstrings, Supine   Lie on your back. Loop a belt or towel over  the ball of your foot as shown.  Straighten your knee and slowly pull on the belt to raise your injured leg. Do not allow the knee to bend. Keep your opposite leg flat on the floor.  Raise the leg until you feel a gentle stretch behind your knee or thigh. Hold this position for __________ seconds.  Repeat __________ times. Complete this stretch __________ times per day. STRETCH - Quadriceps, Prone   Lie on your stomach on a firm surface, such as a bed or padded floor.  Bend your knee and grasp your ankle. If you  are unable to reach, your ankle or pant leg, use a belt around your foot to lengthen your reach.  Gently pull your heel toward your buttocks. Your knee should not slide out to the side. You should feel a stretch in the front of your thigh and/or knee.  Hold this position for __________ seconds.  Repeat __________ times. Complete this stretch __________ times per day. STRETCHING - Hip Flexors, Lunge Half kneel with your knee on the floor and your opposite knee bent and directly over your ankle.  Keep good posture with your head over your shoulders. Tighten your buttocks to point your tailbone downward; this will prevent your back from arching too much.  You should feel a gentle stretch in the front of your thigh and/or hip. If you do not feel any resistance, slightly slide your opposite foot forward and then slowly lunge forward so your knee once again lines up over your ankle. Be sure your tailbone remains pointed downward.  Hold this stretch for __________ seconds.  Repeat __________ times. Complete this stretch __________ times per day. STRETCH - Adductors, Lunge  While standing, spread your legs  Lean away from your injured leg by bending your opposite knee. You may rest your hands on your thigh for balance.  You should feel a stretch in your inner thigh. Hold for __________ seconds.  Repeat __________ times. Complete this exercise __________ times per day. Document Released: 07/26/2004 Document Revised: 09/10/2011 Document Reviewed: 09/30/2008 [facility] Patient Information 2014 [place], [place].

## 2013-01-07 ENCOUNTER — Encounter: Payer: Self-pay | Admitting: Family Medicine

## 2013-01-07 DIAGNOSIS — M25551 Pain in right hip: Secondary | ICD-10-CM | POA: Insufficient documentation

## 2013-01-07 NOTE — Assessment & Plan Note (Signed)
Well controlled. Continue regimen of lisinopril 20 mg daily and propranolol 40 mg daily.    

## 2013-01-07 NOTE — Assessment & Plan Note (Signed)
Given location and point tenderness, most likely trochanteric bursitis. Could be pain related to fibromyalgia, but no way to really distinguish this. He opts for conservative treatment of NSAIDS, icing, and stretches. RTC in 4 weeks.

## 2013-01-13 ENCOUNTER — Ambulatory Visit: Payer: Self-pay

## 2013-02-02 ENCOUNTER — Ambulatory Visit (INDEPENDENT_AMBULATORY_CARE_PROVIDER_SITE_OTHER): Payer: PRIVATE HEALTH INSURANCE | Admitting: General Surgery

## 2013-02-02 ENCOUNTER — Encounter (INDEPENDENT_AMBULATORY_CARE_PROVIDER_SITE_OTHER): Payer: Self-pay | Admitting: General Surgery

## 2013-02-02 VITALS — BP 128/80 | HR 92 | Resp 16 | Ht 72.0 in | Wt 208.6 lb

## 2013-02-02 DIAGNOSIS — A630 Anogenital (venereal) warts: Secondary | ICD-10-CM | POA: Insufficient documentation

## 2013-02-02 NOTE — Patient Instructions (Signed)
Anal Warts  What are anal warts? Anal warts (also called "condyloma acuminata") are a condition that affects the area around and inside the anus. They may also affect the skin of the genital area. They first appear as tiny spots or growths, perhaps as small as the head of a pin, and may grow quite large and cover the entire anal area. Usually, they do not cause pain or discomfort to afflicted individuals and patients may be unaware that the warts are present. Some patients will experience symptoms, such as itching, bleeding, mucus discharge and/or a feeling of a lump or mass in the anal area.  What causes anal warts? They are caused by the human papilloma virus (HPV), which is transmitted from person to person by direct contact. HPV is considered a sexually transmitted disease (STD). You do not have to have anal intercourse to develop anal warts. Do anal warts always need to be removed? Yes. If they are not removed, the warts usually grow larger and multiply. Left untreated, the warts may lead to an increased risk of cancer in the affected area. What treatments are available? If warts are very small and are located only on the skin around the anus, they may be treated with a topical medication. They may also be treated by freezing the warts with liquid nitrogen or removed surgically. Surgery typically involves cutting or burning the warts off. While this provides immediate results, it must be performed using either a local anesthetic - such as novocaine - or a general or spinal anesthetic, depending on the number and exact location of warts being treated. It is important that an internal anal examination with an instrument called an anoscope be done by your treating physician to ensure you do not have any inside the anal canal (internal anal warts). Internal anal warts may not be as suitable for treatment by topical medications, and may need to be treated surgically. Additionally, your physician may wish to  examine the entire pelvic region to include the vaginal or penile area to look for other warts that may require treatment. Must I be hospitalized for surgical treatment? Surgical treatment of anal warts is usually performed as outpatient surgery. How much time will I lose from work after surgical treatment? Most people are moderately uncomfortable for a few days after treatment and pain medication may be prescribed. Depending on the extent of the disease, some people return to work the next day, while others may remain out of work for several days to weeks. Will a single treatment cure the problem? When warts are extensive, your surgeon may wish to perform the surgery in stages. Additionally, recurrent warts are common. The virus that causes the warts can live concealed in tissues that appear normal for several months before another wart develops. As new warts develop, they usually can be treated in the physician's office. Sometimes new warts develop so rapidly that office treatment would be quite uncomfortable. In these situations, a second and, occasionally, third outpatient surgical visit may be recommended. How long is treatment usually continued? Follow-up visits are necessary at frequent intervals for several months after all warts appear to be gone, to be certain that no new warts occur. What can be done to avoid getting these warts again? In some cases, warts may recur repeatedly after successful removal, since the virus that causes the warts often persists in a dormant state in body tissues. Discuss with your physician how often you should be evaluated for recurrent warts. Abstain from sexual   contact with individuals who have anal (or genital) warts. Since many individuals may be unaware that they suffer from this condition, sexual abstinence, condom protection or limiting sexual contact to single partner will reduce your potential exposure to the contagious virus that causes these warts. As a  precaution, sexual partners ought to be checked for warts and other sexual transmitted diseases, even if they have no symptoms. What is a colon and rectal surgeon? Colon and rectal surgeons are experts in the surgical and non-surgical treatment of diseases of the colon, rectum and anus. They have completed advanced surgical training in the treatment of these diseases as well as full general surgical training. Board-certified colon and rectal surgeons complete residencies in general surgery and colon and rectal surgery, and pass intensive examinations conducted by the American Board of Surgery and the American Board of Colon and Rectal Surgery. They are well-versed in the treatment of both benign and malignant diseases of the colon, rectum and anus and are able to perform routine screening examinations and surgically treat conditions if indicated to do so. author: Jennifer Lowney, MD, FASCRS, on behalf of the ASCRS Public Relations Committee  2012 American Society of Colon & Rectal Surgeons   

## 2013-02-02 NOTE — Progress Notes (Signed)
Chief Complaint  Patient presents with  . New Evaluation    eval anal warts    HISTORY: Phillip Gill is a 36 y.o. male who presents to the office with condyloma.  Other symptoms include occasional bleeding, itching.  This had been occurring for several years.  He has had HIV for about medications.  His last CD4 count was good.  His viral load is undetectable.  His bowel habits are irregular and his bowel movements are sometimes hard and sometimes soft.  His fiber intake is dietary and minimal.  He has never had a colonoscopy. He has tried Aldera in the past without success.  Past Medical History  Diagnosis Date  . Anxiety   . Depression   . Sciatica of left side   . HIV infection     followed by Dr. Comer- sees him every 4 months  . Fibromyalgia muscle pain 02/11/2012    37 year old male with HIV and chronic neck and back neck pain who presents for follow up of neck pain. This problem has been persistent for several years and has been evaluated by Dr. Caviness, his previous PCP numerous times. He also brings it up as a concern at his appointment with the infectious disease doctors and has also presented to the ED for this problem approximately. He was prescribed M  . Hypertension   . Anxiety   . Cellulitis of face 11/19/2012  . Arthritis       Past Surgical History  Procedure Laterality Date  . Wisdom tooth extraction          Current Outpatient Prescriptions  Medication Sig Dispense Refill  . Darunavir Ethanolate (PREZISTA) 800 MG tablet Take 1 tablet (800 mg total) by mouth daily.  30 tablet  11  . emtricitabine-tenofovir (TRUVADA) 200-300 MG per tablet Take 1 tablet by mouth daily.  30 tablet  11  . imiquimod (ALDARA) 5 % cream Apply topically 2 (two) times a week.  12 each  1  . lisinopril (PRINIVIL,ZESTRIL) 20 MG tablet Take 1 tablet (20 mg total) by mouth daily.  30 tablet  3  . propranolol (INDERAL) 40 MG tablet Take 1 tablet (40 mg total) by mouth daily.  60 tablet  5  .  risperiDONE (RISPERDAL) 2 MG tablet Take 2 mg by mouth daily.      . ritonavir (NORVIR) 100 MG TABS Take 1 tablet (100 mg total) by mouth daily.  30 tablet  11  . rizatriptan (MAXALT) 10 MG tablet Take 0.5 tablets (5 mg total) by mouth as needed for migraine. May repeat in 2 hours if needed  10 tablet  0  . [DISCONTINUED] gabapentin (NEURONTIN) 300 MG capsule Take 1 capsule (300 mg total) by mouth 3 (three) times daily.  90 capsule  3   No current facility-administered medications for this visit.      Allergies  Allergen Reactions  . Bactrim (Sulfamethoxazole W/Trimethoprim (Co-Trimoxazole)) Hives and Shortness Of Breath  . Cymbalta (Duloxetine Hcl) Diarrhea  . Other Hives and Swelling    [employer] toothpaste   . Sertraline Hcl Diarrhea  . Zoloft (Sertraline Hcl)   . Neurontin (Gabapentin) Rash      Family History  Problem Relation Age of Onset  . Mental illness Neg Hx   . Hypertension Mother   . Diabetes Mother   . Stroke Mother     cerbral aneurysm  . Hypertension Brother     History   Social History  . Marital Status:   Single    Spouse Name: N/A    Number of Children: N/A  . Years of Education: N/A   Social History Main Topics  . Smoking status: Current Every Day Smoker -- 0.50 packs/day for 15 years    Types: Cigarettes  . Smokeless tobacco: Never Used  . Alcohol Use: Yes     Comment: every 2 weeks hard liquor  . Drug Use: No  . Sexually Active: Yes -- Male partner(s)    Birth Control/ Protection: Condom     Comment: pt. given condoms   Other Topics Concern  . Not on file   Social History Narrative   Grew up in [place], now living in [place],-  Working [employer]- works 3rd shift.    Finished HS.   Doing online classes with [employer]- studying accounting   Previously 2 years of classes at [employer].    Has Partner- [name]- together for 1 year.    Has 2 girls- (born in 2000).             REVIEW OF SYSTEMS - PERTINENT POSITIVES  ONLY: Review of Systems - General ROS: negative for - chills, fever or weight loss Hematological and Lymphatic ROS: negative for - bleeding problems, blood clots or bruising Respiratory ROS: no cough, shortness of breath, or wheezing Cardiovascular ROS: no chest pain or dyspnea on exertion Gastrointestinal ROS: no abdominal pain, change in bowel habits, or black or bloody stools Genito-Urinary ROS: no dysuria, trouble voiding, or hematuria  EXAM: Filed Vitals:   02/02/13 1618  BP: 128/80  Pulse: 92  Resp: 16    General appearance: alert and cooperative Resp: clear to auscultation bilaterally Cardio: regular rate and rhythm GI: soft, non-tender; bowel sounds normal; no masses,  no organomegaly   Anal Exam Findings: multiple condyloma, internal and external, posterior mass    ASSESSMENT AND PLAN: 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  I have recommended an HRA with biopsy and laser ablation.  We discussed the management of anal warts. We discussed chemical destruction, immunotherapy, and surgical excision. I discussed the pros and cons of each approach. We discussed the risk and benefits and the expected outcome with chemical destruction with agents such as podophyllin. I explained that podophyllin is generally not been effective and has a high recurrence rate. We discussed the use of Aldara ointment. I explained that it has a 30-70% chance at resolving or at least reducing the number of anal warts. I explained that it is applied 3 times a week at night and left on overnight. I explained that skin irritation is the most common side effect. We then discussed surgical excision specifically excision and fulguration. I explained how the surgery is performed. I explained that it can be painful however it generally has the highest success rate. We discussed the risk and benefits of surgery including but not limited to bleeding, infection, injury to surrounding structures,  need to do a formal anoscopic exam to evaluate for anal canal warts, urinary retention, wart recurrence, and general anesthesia risk. We discussed the typical aftercare.      Lysha Schrade C Shakura Cowing, MD Colon and Rectal Surgery / General Surgery [facility]      Visit Diagnoses: 1. Anal condyloma     Primary Care Physician: WILLIAMSON, EDWARD, MD  

## 2013-02-04 ENCOUNTER — Encounter (HOSPITAL_BASED_OUTPATIENT_CLINIC_OR_DEPARTMENT_OTHER): Payer: Self-pay | Admitting: *Deleted

## 2013-02-04 NOTE — Progress Notes (Signed)
NPO AFTER MN WITH EXCEPTION CLEAR LIQUIDS UNTIL 0730 (NO CREAM/ MILK PRODUCTS). ARRIVES AT 1200. NEEDS ISTAT. CURRENT EKG IN EPIC AND CHART.

## 2013-02-05 ENCOUNTER — Other Ambulatory Visit: Payer: Self-pay | Admitting: Internal Medicine

## 2013-02-05 DIAGNOSIS — B20 Human immunodeficiency virus [HIV] disease: Secondary | ICD-10-CM

## 2013-02-09 ENCOUNTER — Encounter: Payer: Self-pay | Admitting: Family Medicine

## 2013-02-09 ENCOUNTER — Ambulatory Visit (INDEPENDENT_AMBULATORY_CARE_PROVIDER_SITE_OTHER): Payer: No Typology Code available for payment source | Admitting: Family Medicine

## 2013-02-09 VITALS — BP 120/90 | HR 73 | Ht 72.0 in | Wt 211.9 lb

## 2013-02-09 DIAGNOSIS — M797 Fibromyalgia: Secondary | ICD-10-CM

## 2013-02-09 DIAGNOSIS — IMO0001 Reserved for inherently not codable concepts without codable children: Secondary | ICD-10-CM

## 2013-02-09 DIAGNOSIS — I10 Essential (primary) hypertension: Secondary | ICD-10-CM

## 2013-02-09 NOTE — Assessment & Plan Note (Signed)
Mild pain. Use aspercreme as needed. No need for imaging or intervention.

## 2013-02-09 NOTE — Patient Instructions (Signed)
Mr. Hinderman,   You look like you are doing great right now with your pain and blood pressure. I am very pleased. Continue your same blood pressure medications. Also, please use Aspercreme as needed for your back. You can buy that at any pharmacy over the counter.   Your procedure will go well, so don't stress too much about it.   Please return in 3 months or sooner if needed.   Sincerely,   Dr. Clinton Sawyer

## 2013-02-09 NOTE — Progress Notes (Signed)
  Subjective:    Patient ID: Phillip Gill, male    DOB: 1975-10-05, 37 y.o.   MRN: [id number]  HPI  37 year old M with HIV, chronic pain syndrome, and HTN who presents for follow up.    Hypertension  Home BP monitoring:   Office BP: BP Readings from Last 3 Encounters:  02/09/13 131/94  02/02/13 128/80  01/05/13 132/88    Prescribed meds: Lisinopril 40 mg, Propranolol 40 mg daily  Hypertension ROS:  Taking medications as prescribed:No Chest pain: No Shortness of breath: No Swelling of extremities: No TIA symptoms: No    Back Pain: Location: central lower back Duration: a few weeks Quality: 6/10 Current Functional Status:  ADL's  Preceding Events: mp falls. no trauma Alleviating Factors: says he just "deals with it" Exacerbating Factors: none Hx of intervention: patient with known fibromyalgia who has failed amitriptyline, cymbalta and gabapentin  Hx of imaging: MRI scan of his cervical spine done 12/28/2010 showed some degenerative changes at C1-C2. He also has mild spondylosis at C2-C7. No stenosis. MRI of his lumbar spine done on 07/16/2011 showed some mild degenerative facet changes in the lower lumbar segments. No stenosis Red Flags: no weakness, no numbness and tingling, no impaired bowel or bladder function     Review of Systems     Objective:   Physical Exam BP 131/94  Pulse 73  Ht 6' (1.829 m)  Wt 211 lb 14.4 oz (96.117 kg)  BMI 28.73 kg/m2  Gen: AAM, well appearing, NAD, pleasant and conversant CV: RRR, no m/r/g, no JVD or carotid bruits Pulm: normal WOB, CTA-B Back: normal appearing, mild TTP in lumbar paraspinal muscles Neuro: 5/5 strength LE bilaterally, 2+ patellar reflexes        Assessment & Plan:

## 2013-02-09 NOTE — Assessment & Plan Note (Signed)
Well controlled. Continue regimen of lisinopril 20 mg daily and propranolol 40 mg daily.

## 2013-02-11 ENCOUNTER — Ambulatory Visit (HOSPITAL_BASED_OUTPATIENT_CLINIC_OR_DEPARTMENT_OTHER)
Admission: RE | Admit: 2013-02-11 | Discharge: 2013-02-11 | Disposition: A | Discharge status: Home / Self Care | Payer: No Typology Code available for payment source | Source: Ambulatory Visit | Attending: General Surgery | Admitting: General Surgery

## 2013-02-11 ENCOUNTER — Encounter (HOSPITAL_BASED_OUTPATIENT_CLINIC_OR_DEPARTMENT_OTHER)
Admission: RE | Disposition: A | Discharge status: Home / Self Care | Payer: Self-pay | Source: Ambulatory Visit | Attending: General Surgery

## 2013-02-11 ENCOUNTER — Ambulatory Visit (HOSPITAL_BASED_OUTPATIENT_CLINIC_OR_DEPARTMENT_OTHER): Payer: No Typology Code available for payment source | Admitting: Anesthesiology

## 2013-02-11 ENCOUNTER — Encounter (HOSPITAL_BASED_OUTPATIENT_CLINIC_OR_DEPARTMENT_OTHER): Payer: Self-pay | Admitting: Anesthesiology

## 2013-02-11 ENCOUNTER — Encounter (HOSPITAL_BASED_OUTPATIENT_CLINIC_OR_DEPARTMENT_OTHER): Payer: Self-pay | Admitting: *Deleted

## 2013-02-11 DIAGNOSIS — A630 Anogenital (venereal) warts: Secondary | ICD-10-CM

## 2013-02-11 DIAGNOSIS — I10 Essential (primary) hypertension: Secondary | ICD-10-CM | POA: Insufficient documentation

## 2013-02-11 DIAGNOSIS — F17200 Nicotine dependence, unspecified, uncomplicated: Secondary | ICD-10-CM | POA: Insufficient documentation

## 2013-02-11 DIAGNOSIS — Z79899 Other long term (current) drug therapy: Secondary | ICD-10-CM | POA: Insufficient documentation

## 2013-02-11 DIAGNOSIS — Z21 Asymptomatic human immunodeficiency virus [HIV] infection status: Secondary | ICD-10-CM | POA: Insufficient documentation

## 2013-02-11 HISTORY — PX: CO2 LASER APPLICATION: SHX5778

## 2013-02-11 HISTORY — PX: HIGH RESOLUTION ANOSCOPY: SHX6345

## 2013-02-11 HISTORY — DX: Irritable bowel syndrome, unspecified: K58.9

## 2013-02-11 HISTORY — DX: Migraine, unspecified, not intractable, without status migrainosus: G43.909

## 2013-02-11 HISTORY — DX: Chronic viral hepatitis B without delta-agent: B18.1

## 2013-02-11 LAB — POCT I-STAT 4, (NA,K, GLUC, HGB,HCT)
HCT: 41 % (ref 39.0–52.0)
Hemoglobin: 13.9 g/dL (ref 13.0–17.0)
Potassium: 4 mEq/L (ref 3.5–5.1)
Sodium: 142 mEq/L (ref 135–145)

## 2013-02-11 SURGERY — ANOSCOPY, HIGH RESOLUTION
Anesthesia: General | Site: Anus | Wound class: Clean Contaminated

## 2013-02-11 MED ORDER — PROMETHAZINE HCL 25 MG/ML IJ SOLN
6.2500 mg | INTRAMUSCULAR | Status: DC | PRN
  Filled 2013-02-11: qty 1

## 2013-02-11 MED ORDER — OXYCODONE HCL 5 MG PO TABS
5.0000 mg | ORAL_TABLET | Freq: Once | ORAL | Status: DC | PRN
  Filled 2013-02-11: qty 1

## 2013-02-11 MED ORDER — ACETAMINOPHEN 325 MG PO TABS
650.0000 mg | ORAL_TABLET | ORAL | Status: DC | PRN
  Filled 2013-02-11: qty 2

## 2013-02-11 MED ORDER — ACETAMINOPHEN 650 MG RE SUPP
650.0000 mg | RECTAL | Status: DC | PRN
  Filled 2013-02-11: qty 1

## 2013-02-11 MED ORDER — ONDANSETRON HCL 4 MG/2ML IJ SOLN
4.0000 mg | Freq: Four times a day (QID) | INTRAMUSCULAR | Status: DC | PRN
  Filled 2013-02-11: qty 2

## 2013-02-11 MED ORDER — DEXAMETHASONE SODIUM PHOSPHATE 4 MG/ML IJ SOLN
INTRAMUSCULAR | Status: DC | PRN
  Administered 2013-02-11: 10 mg via INTRAVENOUS

## 2013-02-11 MED ORDER — MEPERIDINE HCL 25 MG/ML IJ SOLN
6.2500 mg | INTRAMUSCULAR | Status: DC | PRN
  Filled 2013-02-11: qty 1

## 2013-02-11 MED ORDER — LIDOCAINE 5 % EX OINT
TOPICAL_OINTMENT | CUTANEOUS | Status: DC | PRN
  Administered 2013-02-11: 1

## 2013-02-11 MED ORDER — PROPOFOL 10 MG/ML IV BOLUS
INTRAVENOUS | Status: DC | PRN
  Administered 2013-02-11: 200 mg via INTRAVENOUS

## 2013-02-11 MED ORDER — OXYCODONE HCL 5 MG PO TABS
5.0000 mg | ORAL_TABLET | ORAL | Status: DC | PRN
  Filled 2013-02-11: qty 2

## 2013-02-11 MED ORDER — SODIUM CHLORIDE 0.9 % IV SOLN
250.0000 mL | INTRAVENOUS | Status: DC | PRN
Start: 2013-02-11 — End: 2013-02-11
  Filled 2013-02-11: qty 250

## 2013-02-11 MED ORDER — OXYCODONE HCL 5 MG/5ML PO SOLN
5.0000 mg | Freq: Once | ORAL | Status: DC | PRN
  Filled 2013-02-11: qty 5

## 2013-02-11 MED ORDER — DIAZEPAM 5 MG PO TABS: 5.0000 mg | ORAL_TABLET | Freq: Four times a day (QID) | ORAL | Status: DC | PRN

## 2013-02-11 MED ORDER — MIDAZOLAM HCL 5 MG/5ML IJ SOLN
INTRAMUSCULAR | Status: DC | PRN
  Administered 2013-02-11: 2 mg via INTRAVENOUS

## 2013-02-11 MED ORDER — ONDANSETRON HCL 4 MG/2ML IJ SOLN
INTRAMUSCULAR | Status: DC | PRN
  Administered 2013-02-11: 4 mg via INTRAVENOUS

## 2013-02-11 MED ORDER — SUCCINYLCHOLINE CHLORIDE 20 MG/ML IJ SOLN
INTRAMUSCULAR | Status: DC | PRN
  Administered 2013-02-11: 140 mg via INTRAVENOUS

## 2013-02-11 MED ORDER — SODIUM CHLORIDE 0.9 % IJ SOLN
3.0000 mL | INTRAMUSCULAR | Status: DC | PRN
  Filled 2013-02-11: qty 3

## 2013-02-11 MED ORDER — FENTANYL CITRATE 0.05 MG/ML IJ SOLN
INTRAMUSCULAR | Status: DC | PRN
  Administered 2013-02-11 (×4): 50 ug via INTRAVENOUS

## 2013-02-11 MED ORDER — LIDOCAINE HCL (CARDIAC) 20 MG/ML IV SOLN
INTRAVENOUS | Status: DC | PRN
  Administered 2013-02-11: 100 mg via INTRAVENOUS

## 2013-02-11 MED ORDER — OXYCODONE HCL 5 MG PO TABS: 5.0000 mg | ORAL_TABLET | ORAL | Status: DC | PRN

## 2013-02-11 MED ORDER — ACETIC ACID 5 % SOLN
Status: DC | PRN
  Administered 2013-02-11: 1 via TOPICAL

## 2013-02-11 MED ORDER — BUPIVACAINE-EPINEPHRINE 0.5% -1:200000 IJ SOLN
INTRAMUSCULAR | Status: DC | PRN
  Administered 2013-02-11: 30 mL

## 2013-02-11 MED ORDER — HYDROMORPHONE HCL PF 1 MG/ML IJ SOLN
0.2500 mg | INTRAMUSCULAR | Status: DC | PRN
  Filled 2013-02-11: qty 1

## 2013-02-11 MED ORDER — SODIUM CHLORIDE 0.9 % IJ SOLN
3.0000 mL | Freq: Two times a day (BID) | INTRAMUSCULAR | Status: DC
  Filled 2013-02-11: qty 3

## 2013-02-11 MED ORDER — DOCUSATE SODIUM 100 MG PO CAPS: 100.0000 mg | ORAL_CAPSULE | Freq: Two times a day (BID) | ORAL | Status: DC

## 2013-02-11 MED ORDER — PSYLLIUM 28.3 % PO POWD: ORAL | Status: DC

## 2013-02-11 MED ORDER — LACTATED RINGERS IV SOLN
INTRAVENOUS | Status: DC
  Administered 2013-02-11 (×2): via INTRAVENOUS
  Filled 2013-02-11: qty 1000

## 2013-02-11 SURGICAL SUPPLY — 51 items
APL SKNCLS STERI-STRIP NONHPOA (GAUZE/BANDAGES/DRESSINGS) ×1
BENZOIN TINCTURE PRP APPL 2/3 (GAUZE/BANDAGES/DRESSINGS) ×2 IMPLANT
BLADE HEX COATED 2.75 (ELECTRODE) ×2 IMPLANT
BLADE SURG 15 STRL LF DISP TIS (BLADE) ×1 IMPLANT
BLADE SURG 15 STRL SS (BLADE) ×2
BRIEF STRETCH FOR OB PAD LRG (UNDERPADS AND DIAPERS) ×3 IMPLANT
CANISTER SUCTION 2500CC (MISCELLANEOUS) ×2 IMPLANT
CLOTH BEACON ORANGE TIMEOUT ST (SAFETY) ×2 IMPLANT
COVER TABLE BACK 60X90 (DRAPES) ×2 IMPLANT
DECANTER SPIKE VIAL GLASS SM (MISCELLANEOUS) ×2 IMPLANT
DRAPE LG THREE QUARTER DISP (DRAPES) ×3 IMPLANT
DRAPE PED LAPAROTOMY (DRAPES) ×2 IMPLANT
DRAPE UNDERBUTTOCKS STRL (DRAPE) IMPLANT
DRSG PAD ABDOMINAL 8X10 ST (GAUZE/BANDAGES/DRESSINGS) ×1 IMPLANT
ELECT BLADE 6.5 .24CM SHAFT (ELECTRODE) IMPLANT
ELECT REM PT RETURN 9FT ADLT (ELECTROSURGICAL) ×2
ELECTRODE REM PT RTRN 9FT ADLT (ELECTROSURGICAL) ×1 IMPLANT
GAUZE SPONGE 4X4 12PLY STRL LF (GAUZE/BANDAGES/DRESSINGS) ×2 IMPLANT
GAUZE SPONGE 4X4 16PLY XRAY LF (GAUZE/BANDAGES/DRESSINGS) IMPLANT
GAUZE VASELINE 3X9 (GAUZE/BANDAGES/DRESSINGS) IMPLANT
GLOVE BIO SURGEON STRL SZ 6.5 (GLOVE) ×2 IMPLANT
GLOVE BIOGEL PI IND STRL 7.0 (GLOVE) ×1 IMPLANT
GLOVE BIOGEL PI IND STRL 7.5 (GLOVE) IMPLANT
GLOVE BIOGEL PI INDICATOR 7.0 (GLOVE) ×1
GLOVE BIOGEL PI INDICATOR 7.5 (GLOVE) ×3
GOWN PREVENTION PLUS LG XLONG (DISPOSABLE) ×2 IMPLANT
GOWN PREVENTION PLUS XLARGE (GOWN DISPOSABLE) ×2 IMPLANT
GOWN PREVENTION PLUS XXLARGE (GOWN DISPOSABLE) ×1 IMPLANT
GOWN STRL REIN XL XLG (GOWN DISPOSABLE) ×1 IMPLANT
LEGGING LITHOTOMY PAIR STRL (DRAPES) IMPLANT
NDL HYPO 25X1 1.5 SAFETY (NEEDLE) ×1 IMPLANT
NDL SAFETY ECLIPSE 18X1.5 (NEEDLE) IMPLANT
NEEDLE HYPO 18GX1.5 SHARP (NEEDLE)
NEEDLE HYPO 25X1 1.5 SAFETY (NEEDLE) ×2 IMPLANT
NS IRRIG 500ML POUR BTL (IV SOLUTION) ×2 IMPLANT
PACK BASIN DAY SURGERY FS (CUSTOM PROCEDURE TRAY) ×2 IMPLANT
PENCIL BUTTON HOLSTER BLD 10FT (ELECTRODE) ×2 IMPLANT
SPONGE GAUZE 4X4 12PLY (GAUZE/BANDAGES/DRESSINGS) ×1 IMPLANT
SPONGE SURGIFOAM ABS GEL 12-7 (HEMOSTASIS) IMPLANT
SUT CHROMIC 2 0 SH (SUTURE) IMPLANT
SUT CHROMIC 3 0 SH 27 (SUTURE) IMPLANT
SUT MON AB 3-0 SH 27 (SUTURE)
SUT MON AB 3-0 SH27 (SUTURE) IMPLANT
SUT VIC AB 4-0 P-3 18XBRD (SUTURE) IMPLANT
SUT VIC AB 4-0 P3 18 (SUTURE)
SYR BULB IRRIGATION 50ML (SYRINGE) ×2 IMPLANT
SYR CONTROL 10ML LL (SYRINGE) ×2 IMPLANT
TOWEL OR 17X24 6PK STRL BLUE (TOWEL DISPOSABLE) ×2 IMPLANT
TRAY DSU PREP LF (CUSTOM PROCEDURE TRAY) ×3 IMPLANT
TUBE CONNECTING 12X1/4 (SUCTIONS) ×2 IMPLANT
YANKAUER SUCT BULB TIP NO VENT (SUCTIONS) IMPLANT

## 2013-02-11 NOTE — Transfer of Care (Signed)
Immediate Anesthesia Transfer of Care Note  Patient: Phillip Gill  Procedure(s) Performed: Procedure(s) (LRB): HIGH RESOLUTION ANOSCOPY WITH BIOPSY, LASER ABLATION (N/A) CO2 LASER APPLICATION (N/A)  Patient Location: PACU  Anesthesia Type: General  Level of Consciousness: awake, alert  and oriented  Airway & Oxygen Therapy: Patient Spontanous Breathing and Patient connected to face mask oxygen  Post-op Assessment: Report given to PACU RN and Post -op Vital signs reviewed and stable  Post vital signs: Reviewed and stable  Complications: No apparent anesthesia complications

## 2013-02-11 NOTE — Interval H&P Note (Signed)
History and Physical Interval Note:  02/11/2013 1:10 PM  Phillip Gill  has presented today for surgery, with the diagnosis of anal condyloma  The various methods of treatment have been discussed with the patient and family. After consideration of risks, benefits and other options for treatment, the patient has consented to  Procedure(s): HIGH RESOLUTION ANOSCOPY WITH BIOPSY, LASER ABLATION (N/A) CO2 LASER APPLICATION (N/A) as a surgical intervention .  The patient's history has been reviewed, patient examined, no change in status, stable for surgery.  I have reviewed the patient's chart and labs.  Questions were answered to the patient's satisfaction.     Vanita Panda, MD  Colorectal and General Surgery [facility]

## 2013-02-11 NOTE — H&P (View-Only) (Signed)
Chief Complaint  Patient presents with  . New Evaluation    eval anal warts    HISTORY: Phillip Gill is a 37 y.o. male who presents to the office with condyloma.  Other symptoms include occasional bleeding, itching.  This had been occurring for several years.  He has had HIV for about medications.  His last CD4 count was good.  His viral load is undetectable.  His bowel habits are irregular and his bowel movements are sometimes hard and sometimes soft.  His fiber intake is dietary and minimal.  He has never had a colonoscopy. He has tried [place] in the past without success.  Past Medical History  Diagnosis Date  . Anxiety   . Depression   . Sciatica of left side   . HIV infection     followed by Dr. Luciana Axe- sees him every 4 months  . Fibromyalgia muscle pain 02/11/2012    37 year old male with HIV and chronic neck and back neck pain who presents for follow up of neck pain. This problem has been persistent for several years and has been evaluated by Dr. Edmonia James, his previous PCP numerous times. He also brings it up as a concern at his appointment with the infectious disease doctors and has also presented to the ED for this problem approximately. He was prescribed M  . Hypertension   . Anxiety   . Cellulitis of face 11/19/2012  . Arthritis       Past Surgical History  Procedure Laterality Date  . Wisdom tooth extraction          Current Outpatient Prescriptions  Medication Sig Dispense Refill  . Darunavir Ethanolate (PREZISTA) 800 MG tablet Take 1 tablet (800 mg total) by mouth daily.  30 tablet  11  . emtricitabine-tenofovir (TRUVADA) 200-300 MG per tablet Take 1 tablet by mouth daily.  30 tablet  11  . imiquimod (ALDARA) 5 % cream Apply topically 2 (two) times a week.  12 each  1  . lisinopril (PRINIVIL,ZESTRIL) 20 MG tablet Take 1 tablet (20 mg total) by mouth daily.  30 tablet  3  . propranolol (INDERAL) 40 MG tablet Take 1 tablet (40 mg total) by mouth daily.  60 tablet  5  .  risperiDONE (RISPERDAL) 2 MG tablet Take 2 mg by mouth daily.      . ritonavir (NORVIR) 100 MG TABS Take 1 tablet (100 mg total) by mouth daily.  30 tablet  11  . rizatriptan (MAXALT) 10 MG tablet Take 0.5 tablets (5 mg total) by mouth as needed for migraine. May repeat in 2 hours if needed  10 tablet  0  . [DISCONTINUED] gabapentin (NEURONTIN) 300 MG capsule Take 1 capsule (300 mg total) by mouth 3 (three) times daily.  90 capsule  3   No current facility-administered medications for this visit.      Allergies  Allergen Reactions  . Bactrim (Sulfamethoxazole W/Trimethoprim (Co-Trimoxazole)) Hives and Shortness Of Breath  . Cymbalta (Duloxetine Hcl) Diarrhea  . Other Hives and Swelling    [employer] toothpaste   . Sertraline Hcl Diarrhea  . Zoloft (Sertraline Hcl)   . Neurontin (Gabapentin) Rash      Family History  Problem Relation Age of Onset  . Mental illness Neg Hx   . Hypertension Mother   . Diabetes Mother   . Stroke Mother     cerbral aneurysm  . Hypertension Brother     History   Social History  . Marital Status:  Single    Spouse Name: N/A    Number of Children: N/A  . Years of Education: N/A   Social History Main Topics  . Smoking status: Current Every Day Smoker -- 0.50 packs/day for 15 years    Types: Cigarettes  . Smokeless tobacco: Never Used  . Alcohol Use: Yes     Comment: every 2 weeks hard liquor  . Drug Use: No  . Sexually Active: Yes -- Male partner(s)    Birth Control/ Protection: Condom     Comment: pt. given condoms   Other Topics Concern  . 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    Finished HS.   Doing online classes with [employer]- studying accounting   Previously 2 years of classes at [employer].    Has Partner- [name]- together for 1 year.    Has 2 girls- (born in 2000).             REVIEW OF SYSTEMS - PERTINENT POSITIVES  ONLY: Review of Systems - General ROS: negative for - chills, fever or weight loss Hematological and Lymphatic ROS: negative for - bleeding problems, blood clots or bruising Respiratory ROS: no cough, shortness of breath, or wheezing Cardiovascular ROS: no chest pain or dyspnea on exertion Gastrointestinal ROS: no abdominal pain, change in bowel habits, or black or bloody stools Genito-Urinary ROS: no dysuria, trouble voiding, or hematuria  EXAM: Filed Vitals:   02/02/13 1618  BP: 128/80  Pulse: 92  Resp: 16    General appearance: alert and cooperative Resp: clear to auscultation bilaterally Cardio: regular rate and rhythm GI: soft, non-tender; bowel sounds normal; no masses,  no organomegaly   Anal Exam Findings: multiple condyloma, internal and external, posterior mass    ASSESSMENT AND PLAN: Phillip Gill is a 37 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  I have recommended an HRA with biopsy and laser ablation.  We discussed the management of anal warts. We discussed chemical destruction, immunotherapy, and surgical excision. I discussed the pros and cons of each approach. We discussed the risk and benefits and the expected outcome with chemical destruction with agents such as podophyllin. I explained that podophyllin is generally not been effective and has a high recurrence rate. We discussed the use of Aldara ointment. I explained that it has a 30-70% chance at resolving or at least reducing the number of anal warts. I explained that it is applied 3 times a week at night and left on overnight. I explained that skin irritation is the most common side effect. We then discussed surgical excision specifically excision and fulguration. I explained how the surgery is performed. I explained that it can be painful however it generally has the highest success rate. We discussed the risk and benefits of surgery including but not limited to bleeding, infection, injury to surrounding structures,  need to do a formal anoscopic exam to evaluate for anal canal warts, urinary retention, wart recurrence, and general anesthesia risk. We discussed the typical aftercare.      Vanita Panda, MD Colon and Rectal Surgery / General Surgery [facility]      Visit Diagnoses: 1. Anal condyloma     Primary Care Physician: Mat Carne, MD

## 2013-02-11 NOTE — Anesthesia Procedure Notes (Signed)
Procedure Name: Intubation Date/Time: 02/11/2013 1:45 PM Performed by: Norva Pavlov Pre-anesthesia Checklist: Patient identified, Emergency Drugs available, Suction available and Patient being monitored Patient Re-evaluated:Patient Re-evaluated prior to inductionOxygen Delivery Method: Circle System Utilized Preoxygenation: Pre-oxygenation with 100% oxygen Intubation Type: IV induction Ventilation: Mask ventilation without difficulty Laryngoscope Size: Mac and 4 Grade View: Grade II Tube type: Oral Tube size: 8.0 mm Number of attempts: 1 Airway Equipment and Method: stylet and oral airway Placement Confirmation: ETT inserted through vocal cords under direct vision,  positive ETCO2 and breath sounds checked- equal and bilateral Secured at: 22 cm Tube secured with: Tape Dental Injury: Teeth and Oropharynx as per pre-operative assessment

## 2013-02-11 NOTE — Anesthesia Preprocedure Evaluation (Addendum)
Anesthesia Evaluation  Patient identified by MRN, date of birth, ID band Patient awake    Reviewed: Allergy & Precautions, H&P , NPO status , Patient's Chart, lab work & pertinent test results  Airway Mallampati: II TM Distance: >3 FB Neck ROM: Full    Dental  (+) Dental Advisory Given and Teeth Intact   Pulmonary neg pulmonary ROS,  breath sounds clear to auscultation        Cardiovascular hypertension, Pt. on medications Rhythm:Regular Rate:Normal     Neuro/Psych  Headaches, PSYCHIATRIC DISORDERS Anxiety Depression    GI/Hepatic negative GI ROS, (+) Hepatitis -, B  Endo/Other  negative endocrine ROS  Renal/GU negative Renal ROS     Musculoskeletal negative musculoskeletal ROS (+)   Abdominal   Peds  Hematology negative hematology ROS (+)   Anesthesia Other Findings   Reproductive/Obstetrics                          Anesthesia Physical Anesthesia Plan  ASA: II  Anesthesia Plan: General   Post-op Pain Management:    Induction: Intravenous  Airway Management Planned: LMA and Oral ETT  Additional Equipment:   Intra-op Plan:   Post-operative Plan: Extubation in OR  Informed Consent: I have reviewed the patients History and Physical, chart, labs and discussed the procedure including the risks, benefits and alternatives for the proposed anesthesia with the patient or authorized representative who has indicated his/her understanding and acceptance.   Dental advisory given  Plan Discussed with: CRNA  Anesthesia Plan Comments:         Anesthesia Quick Evaluation

## 2013-02-11 NOTE — Op Note (Signed)
02/11/2013  2:36 PM  PATIENT:  Phillip Gill  37 y.o. male  Patient Care Team: Garnetta Buddy, MD as PCP - General (Family Medicine) Gardiner Barefoot, MD as PCP - Infectious Diseases (Infectious Diseases)  PRE-OPERATIVE DIAGNOSIS:  anal condyloma  POST-OPERATIVE DIAGNOSIS:  anal condyloma  PROCEDURE:  Procedure(s): HIGH RESOLUTION ANOSCOPY WITH BIOPSY, LASER ABLATION CO2 LASER APPLICATION  SURGEON:  Surgeon(s): Romie Levee, MD  ASSISTANT: none   ANESTHESIA:   local and general  EBL: 5ml  Total I/O In: 1000 [I.V.:1000] Out: -   SPECIMEN:  Source of Specimen:  ant and post anal canal  DISPOSITION OF SPECIMEN:  PATHOLOGY  COUNTS:  YES  PLAN OF CARE: Discharge to home after PACU  PATIENT DISPOSITION:  PACU - hemodynamically stable.  INDICATION: 37 y.o. M with HIV and anal condyloma  OR FINDINGS: mostly internal anal condyloma, suspicious lesions noted in anterior and posterior anal canal  DESCRIPTION: The patient was identified in the preoperative holding area and taken to the OR where they were laid prone on the operating room table in jack knife position. General anesthesia was smoothly induced.  The patient was then prepped and draped in the usual sterile fashion. A surgical timeout was performed indicating the correct patient, procedure, positioning and preoperative antibioitics. SCDs were noted to be in place and functioning prior to the operation.   After this was completed, a sponge was soaked in 5% acetic acid was placed over the perianal region. This was allowed to soak for 2 minutes. The sponge was removed and the perianal region was evaluated with a colposcope.  There were no suspicious perianal lesions.  The internal anal canal was evaluated via anoscopy with a Hill-Ferguson anoscope.  There were 2 suspicious lesions noted.  They were biopsied sharpley and sent to pathology as anterior anal canal lesion and posterior anal canal lesion.  After this was  completed, hemostasis was achieved with electrocautery.  Next the laser was brought onto the field.  The edges of the operative field were draped with wet towels.  Appropriate ventilation was obtained.  All staff were protected with small particle masks and goggles safe for the laser.  The laser was then activated. All condylomatous lesions were ablated. Hemostasis was then achieved using electrocautery. A thin layer of Silvadene was then placed over the lesions. A sterile dressing was applied over this. The patient was then awakened from anesthesia and sent to the postanesthesia care unit in stable condition. All counts were correct operating room staff.

## 2013-02-13 ENCOUNTER — Telehealth (INDEPENDENT_AMBULATORY_CARE_PROVIDER_SITE_OTHER): Payer: Self-pay

## 2013-02-13 NOTE — Telephone Encounter (Signed)
Message copied by Ivory Broad on Fri Feb 13, 2013 10:46 AM ------      Message from: Alamo, Broadus John.      Created: Fri Feb 13, 2013  7:27 AM      Regarding: Results       Please let him know that his biopsies showed no signs of precancerous lesions            AT      ----- Message -----         From: [facility] Interface         Sent: 02/12/2013   5:21 PM           To: Romie Levee, MD                   ------

## 2013-02-13 NOTE — Telephone Encounter (Signed)
Left message for pt to call so he can get his results.  Per Dr Maisie Fus his biopsies showed no signs of precancerous lesions.

## 2013-02-13 NOTE — Telephone Encounter (Signed)
Return Call from patient - patient given pathology results (no pre-cancerous lesions found per Dr. Maisie Fus) patient verbalized understanding.

## 2013-02-13 NOTE — Anesthesia Postprocedure Evaluation (Signed)
Anesthesia Post Note  Patient: Phillip Gill  Procedure(s) Performed: Procedure(s) (LRB): HIGH RESOLUTION ANOSCOPY WITH BIOPSY, LASER ABLATION (N/A) CO2 LASER APPLICATION (N/A)  Anesthesia type: General  Patient location: PACU  Post pain: Pain level controlled  Post assessment: Post-op Vital signs reviewed  Last Vitals: BP 142/92  Pulse 77  Temp(Src) 36.6 C (Oral)  Resp 20  Ht 6' (1.829 m)  Wt 210 lb 8 oz (95.482 kg)  BMI 28.54 kg/m2  SpO2 97%  Post vital signs: Reviewed  Level of consciousness: sedated  Complications: No apparent anesthesia complications

## 2013-02-16 ENCOUNTER — Encounter (HOSPITAL_BASED_OUTPATIENT_CLINIC_OR_DEPARTMENT_OTHER): Payer: Self-pay | Admitting: General Surgery

## 2013-03-04 ENCOUNTER — Encounter (INDEPENDENT_AMBULATORY_CARE_PROVIDER_SITE_OTHER): Payer: Self-pay

## 2013-03-18 ENCOUNTER — Ambulatory Visit (INDEPENDENT_AMBULATORY_CARE_PROVIDER_SITE_OTHER): Payer: PRIVATE HEALTH INSURANCE | Admitting: General Surgery

## 2013-03-18 ENCOUNTER — Encounter (INDEPENDENT_AMBULATORY_CARE_PROVIDER_SITE_OTHER): Payer: Self-pay | Admitting: General Surgery

## 2013-03-18 VITALS — BP 132/80 | HR 84 | Temp 98.7°F | Resp 15 | Ht 72.0 in | Wt 207.6 lb

## 2013-03-18 DIAGNOSIS — A630 Anogenital (venereal) warts: Secondary | ICD-10-CM

## 2013-03-18 NOTE — Progress Notes (Signed)
Phillip Gill is a 37 y.o. male who is status post HRA and laser ablation on 8/13.  He is feeling better.  He is having some loose stools.    Objective: Filed Vitals:   03/18/13 1641  BP: 132/80  Pulse: 84  Temp: 98.7 F (37.1 C)  Resp: 15    General appearance: alert and cooperative GI: normal findings: soft, non-tender  Incision: healing well   Assessment: s/p  Patient Active Problem List   Diagnosis Date Noted  . 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 Anal warts 12/25/2012  . Bed wetting 12/02/2012  . Headache(784.0) 09/18/2012  . Impacted cerumen of both ears 07/21/2012  . Tobacco dependence 06/03/2012  . Fibromyalgia muscle pain 02/11/2012  . Depression 12/07/2011  . Sciatica 05/22/2011  . HIV disease 02/15/2011  . Hypertension 02/15/2011  . Hepatitis B infection 02/15/2011  . Anxiety 02/02/2011  . Seasonal allergies 02/02/2011    Plan: Return to the office in 3 months for recheck.  Will try imodium for loose stools.      Vanita Panda, MD [facility], [place] [phone]   03/18/2013 4:53 PM

## 2013-03-18 NOTE — Patient Instructions (Signed)
Call the office if you have any problems.  You can take imodium if your stools are too loose.  Return the the office in 3 mo for follow up

## 2013-03-26 ENCOUNTER — Other Ambulatory Visit: Payer: No Typology Code available for payment source

## 2013-03-26 DIAGNOSIS — B20 Human immunodeficiency virus [HIV] disease: Secondary | ICD-10-CM

## 2013-03-26 LAB — COMPLETE METABOLIC PANEL WITH GFR
ALT: 44 U/L (ref 0–53)
AST: 34 U/L (ref 0–37)
Alkaline Phosphatase: 77 U/L (ref 39–117)
Sodium: 140 mEq/L (ref 135–145)
Total Bilirubin: 0.5 mg/dL (ref 0.3–1.2)
Total Protein: 7.5 g/dL (ref 6.0–8.3)

## 2013-03-26 LAB — CBC WITH DIFFERENTIAL/PLATELET
Basophils Relative: 0 % (ref 0–1)
Eosinophils Absolute: 0 10*3/uL (ref 0.0–0.7)
HCT: 39.2 % (ref 39.0–52.0)
Hemoglobin: 13.6 g/dL (ref 13.0–17.0)
MCH: 30.4 pg (ref 26.0–34.0)
MCHC: 34.7 g/dL (ref 30.0–36.0)
Monocytes Absolute: 0.6 10*3/uL (ref 0.1–1.0)
Monocytes Relative: 7 % (ref 3–12)

## 2013-03-27 LAB — HIV-1 RNA QUANT-NO REFLEX-BLD: HIV-1 RNA Quant, Log: <1.3 {Log} (ref <1.30)

## 2013-03-27 LAB — T-HELPER CELL (CD4) - (RCID CLINIC ONLY): CD4 % Helper T Cell: 30 % — ABNORMAL LOW (ref 33–55)

## 2013-04-09 ENCOUNTER — Ambulatory Visit (INDEPENDENT_AMBULATORY_CARE_PROVIDER_SITE_OTHER): Payer: No Typology Code available for payment source | Admitting: Internal Medicine

## 2013-04-09 ENCOUNTER — Encounter: Payer: Self-pay | Admitting: Internal Medicine

## 2013-04-09 VITALS — BP 156/116 | HR 77 | Temp 98.2°F | Wt 204.0 lb

## 2013-04-09 DIAGNOSIS — Z23 Encounter for immunization: Secondary | ICD-10-CM

## 2013-04-09 DIAGNOSIS — I10 Essential (primary) hypertension: Secondary | ICD-10-CM

## 2013-04-09 DIAGNOSIS — A630 Anogenital (venereal) warts: Secondary | ICD-10-CM

## 2013-04-09 DIAGNOSIS — F17200 Nicotine dependence, unspecified, uncomplicated: Secondary | ICD-10-CM

## 2013-04-09 DIAGNOSIS — B20 Human immunodeficiency virus [HIV] disease: Secondary | ICD-10-CM

## 2013-04-09 NOTE — Progress Notes (Signed)
  Subjective:    Patient ID: Phillip Gill, male    DOB: May 05, 1976, 37 y.o.   MRN: [id number]  HPI  Here for followup of HIV. On darunavir, Norvir and Truvada. Denies any missed doses. Labs with a viral load that is undetectable and CD4 count over 1000. Recently had surgery for his anal warts and improved.  Continues to smoke.  No weight loss, no diarrhea.    Review of Systems  Constitutional: Negative for fatigue and unexpected weight change.  HENT: Negative for sore throat and trouble swallowing.   Gastrointestinal: Negative for nausea and diarrhea.  Skin: Negative for rash.  Neurological: Negative for dizziness and headaches.  Psychiatric/Behavioral: Negative for dysphoric mood.       Objective:   Physical Exam  Constitutional: He appears well-developed and well-nourished. No distress.  HENT:  Mouth/Throat: Oropharynx is clear and moist. No oropharyngeal exudate.  Cardiovascular: Normal rate, regular rhythm and normal heart sounds.  Exam reveals no gallop and no friction rub.   No murmur heard. Pulmonary/Chest: Effort normal and breath sounds normal. No respiratory distress. He has no wheezes. He has no rales.  Lymphadenopathy:    He has no cervical adenopathy.          Assessment & Plan:

## 2013-04-09 NOTE — Assessment & Plan Note (Signed)
Elevated today and is going to get back to his PCP asap

## 2013-04-09 NOTE — Assessment & Plan Note (Signed)
Doing well, rtc in 4 months 

## 2013-04-09 NOTE — Assessment & Plan Note (Signed)
Much better with surgery

## 2013-04-09 NOTE — Assessment & Plan Note (Signed)
Discussed cessation and given info for the [facility] quit line

## 2013-04-11 ENCOUNTER — Other Ambulatory Visit: Payer: Self-pay | Admitting: Internal Medicine

## 2013-04-11 DIAGNOSIS — B20 Human immunodeficiency virus [HIV] disease: Secondary | ICD-10-CM

## 2013-05-05 ENCOUNTER — Encounter (INDEPENDENT_AMBULATORY_CARE_PROVIDER_SITE_OTHER): Payer: Self-pay | Admitting: General Surgery

## 2013-05-06 ENCOUNTER — Emergency Department (HOSPITAL_COMMUNITY)
Admission: EM | Admit: 2013-05-06 | Discharge: 2013-05-06 | Disposition: A | Discharge status: Home / Self Care | Payer: No Typology Code available for payment source | Attending: Emergency Medicine | Admitting: Emergency Medicine

## 2013-05-06 ENCOUNTER — Encounter (HOSPITAL_COMMUNITY): Payer: Self-pay | Admitting: Emergency Medicine

## 2013-05-06 DIAGNOSIS — K5900 Constipation, unspecified: Secondary | ICD-10-CM | POA: Insufficient documentation

## 2013-05-06 DIAGNOSIS — I10 Essential (primary) hypertension: Secondary | ICD-10-CM | POA: Insufficient documentation

## 2013-05-06 DIAGNOSIS — Z21 Asymptomatic human immunodeficiency virus [HIV] infection status: Secondary | ICD-10-CM | POA: Insufficient documentation

## 2013-05-06 DIAGNOSIS — G43909 Migraine, unspecified, not intractable, without status migrainosus: Secondary | ICD-10-CM | POA: Insufficient documentation

## 2013-05-06 DIAGNOSIS — R0602 Shortness of breath: Secondary | ICD-10-CM | POA: Insufficient documentation

## 2013-05-06 DIAGNOSIS — Z8739 Personal history of other diseases of the musculoskeletal system and connective tissue: Secondary | ICD-10-CM | POA: Insufficient documentation

## 2013-05-06 DIAGNOSIS — F410 Panic disorder [episodic paroxysmal anxiety] without agoraphobia: Secondary | ICD-10-CM

## 2013-05-06 DIAGNOSIS — R209 Unspecified disturbances of skin sensation: Secondary | ICD-10-CM | POA: Insufficient documentation

## 2013-05-06 DIAGNOSIS — Z8619 Personal history of other infectious and parasitic diseases: Secondary | ICD-10-CM | POA: Insufficient documentation

## 2013-05-06 DIAGNOSIS — F17200 Nicotine dependence, unspecified, uncomplicated: Secondary | ICD-10-CM | POA: Insufficient documentation

## 2013-05-06 DIAGNOSIS — R072 Precordial pain: Secondary | ICD-10-CM | POA: Insufficient documentation

## 2013-05-06 DIAGNOSIS — R5381 Other malaise: Secondary | ICD-10-CM | POA: Insufficient documentation

## 2013-05-06 DIAGNOSIS — Z79899 Other long term (current) drug therapy: Secondary | ICD-10-CM | POA: Insufficient documentation

## 2013-05-06 MED ORDER — LORAZEPAM 1 MG PO TABS
1.0000 mg | ORAL_TABLET | Freq: Once | ORAL | Status: AC
  Administered 2013-05-06: 0.5 mg via ORAL
  Filled 2013-05-06: qty 1

## 2013-05-06 NOTE — ED Provider Notes (Signed)
CSN: [id number]     Arrival date & time 05/06/13  1140 History   First MD Initiated Contact with Patient 05/06/13 1209     Chief Complaint  Patient presents with  . Panic Attack   HPI  Mr Paxton Binns is a 37 yo male with a history of depression, anxiety, HIV, and chronic Hep B who presents with a panic attack. Panic attack started this AM due to stress at home. Patient has had SOB, chest pain, generalized weakness, feeling 'wobbly' on his legs and numbness on his shins. No loss of consciousness, no falls. Chest pain is substernal and localized, does not radiate and not associated with diaphoresis or nausea. No headache, visual changes, or focal neurological deficits. No loss of bowel or bladder control. Patient states that he is still on his Risperedal but not on his Valium. He has not taken anything for his panic attack today. Tried breathing into a bag and relaxing but nothing has helped. No SI or HI. His mood has been down for the past month or so, loss of appetite and weight loss.   Past Medical History  Diagnosis Date  . Anxiety   . Depression   . HIV infection     followed by Dr. Luciana Axe- sees him every 4 months  . Hypertension   . Anxiety   . Arthritis   . 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 Migraine   . IBS (irritable bowel syndrome)    Past Surgical History  Procedure Laterality Date  . Wisdom tooth extraction    . High resolution anoscopy N/A 02/11/2013    Procedure: HIGH RESOLUTION ANOSCOPY WITH BIOPSY, LASER ABLATION;  Surgeon: Romie Levee, MD;  [facility];  Service: General;  Laterality: N/A;  . Co2 laser application N/A 02/11/2013    Procedure: CO2 LASER APPLICATION;  Surgeon: Romie Levee, MD;  Location: [facility];  Service: General;  Laterality: N/A;   Family History  Problem Relation Age of Onset  . Mental illness Neg Hx   . Hypertension Mother   . Diabetes Mother   . Stroke Mother     cerbral aneurysm  .  Hypertension Brother    History  Substance Use Topics  . Smoking status: Current Every Day Smoker -- 0.50 packs/day for 15 years    Types: Cigarettes  . Smokeless tobacco: Never Used  . Alcohol Use: Yes     Comment: every 2 weeks  liquor    Review of Systems All other systems negative except as documented in the HPI. All pertinent positives and negatives as reviewed in the HPI. Allergies  Bactrim; Cymbalta; Other; Zoloft; and Neurontin  Home Medications   Current Outpatient Rx  Name  Route  Sig  Dispense  Refill  . Darunavir Ethanolate (PREZISTA) 800 MG tablet   Oral   Take 800 mg by mouth every evening.         . diazepam (VALIUM) 5 MG tablet   Oral   Take 1 tablet (5 mg total) by mouth every 6 (six) hours as needed for anxiety.   10 tablet   0   . docusate sodium (COLACE) 100 MG capsule   Oral   Take 1 capsule (100 mg total) by mouth 2 (two) times daily.   10 capsule   0   . lisinopril (PRINIVIL,ZESTRIL) 20 MG tablet   Oral   Take 20 mg by mouth every evening.          Marland Kitchen  loratadine (CLARITIN) 10 MG tablet   Oral   Take 10 mg by mouth daily.         . NORVIR 100 MG TABS tablet      TAKE 1 TABLET BY MOUTH DAILY   30 tablet   5   . propranolol (INDERAL) 40 MG tablet   Oral   Take 40 mg by mouth every evening.         . Psyllium (DIETARY FIBER LAXATIVE) 28.3 % POWD      Take 1 dose twice daily         . risperiDONE (RISPERDAL) 2 MG tablet   Oral   Take 2 mg by mouth every evening.          . rizatriptan (MAXALT) 10 MG tablet   Oral   Take 5 mg by mouth as needed for migraine. May repeat in 2 hours if needed         . TRUVADA 200-300 MG per tablet      TAKE 1 TABLET BY MOUTH EVERY DAY   30 tablet   5    BP 131/97  Pulse 94  Temp(Src) 97.4 F (36.3 C) (Oral)  Resp 22  Ht 6' (1.829 m)  Wt 203 lb 8 oz (92.307 kg)  BMI 27.59 kg/m2  SpO2 98% Physical Exam  Constitutional: He is oriented to person, place, and time. He appears  well-developed and well-nourished. No distress.  Eyes: Conjunctivae and EOM are normal. Pupils are equal, round, and reactive to light.  Neck: Normal range of motion.  Cardiovascular: Normal rate, regular rhythm and normal heart sounds.   Pulmonary/Chest: Effort normal and breath sounds normal.  Neurological: He is alert and oriented to person, place, and time. He has normal strength. No cranial nerve deficit or sensory deficit. He displays a negative Romberg sign.  Reflex Scores:      Tricep reflexes are 2+ on the right side and 2+ on the left side.      Bicep reflexes are 2+ on the right side and 2+ on the left side.      Brachioradialis reflexes are 2+ on the left side.      Patellar reflexes are 2+ on the right side and 2+ on the left side. Skin: Skin is warm and dry.  Psychiatric: His affect is blunt.    ED Course  Procedures (including critical care time) Patient is treated for his current anxiety attack.  He is told to return here as needed.  Advised him to followup with his primary care Dr. for recheck   Carlyle Dolly, PA-C 05/06/13 1400

## 2013-05-06 NOTE — ED Notes (Signed)
Per pt sts panic attack this am. sts brought on by stress. Pt denies SI or HI. sts last one was months ago.
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
## 2013-05-07 NOTE — ED Provider Notes (Signed)
Medical screening examination/treatment/procedure(s) were performed by non-physician practitioner and as supervising physician I was immediately available for consultation/collaboration.  EKG Interpretation   None         Evalynne Locurto W. Asaph Serena, MD 05/07/13 0912 

## 2013-05-09 ENCOUNTER — Other Ambulatory Visit: Payer: Self-pay | Admitting: Family Medicine

## 2013-05-09 DIAGNOSIS — I10 Essential (primary) hypertension: Secondary | ICD-10-CM

## 2013-05-12 IMAGING — CR DG HIP COMPLETE 2+V*R*
3 series · 3 of 3 positions shown · non-contrast
Comparison: None.

CLINICAL DATA: Pain without trauma.

RIGHT HIP - COMPLETE 2+ VIEW

[t pelvis a.p.]
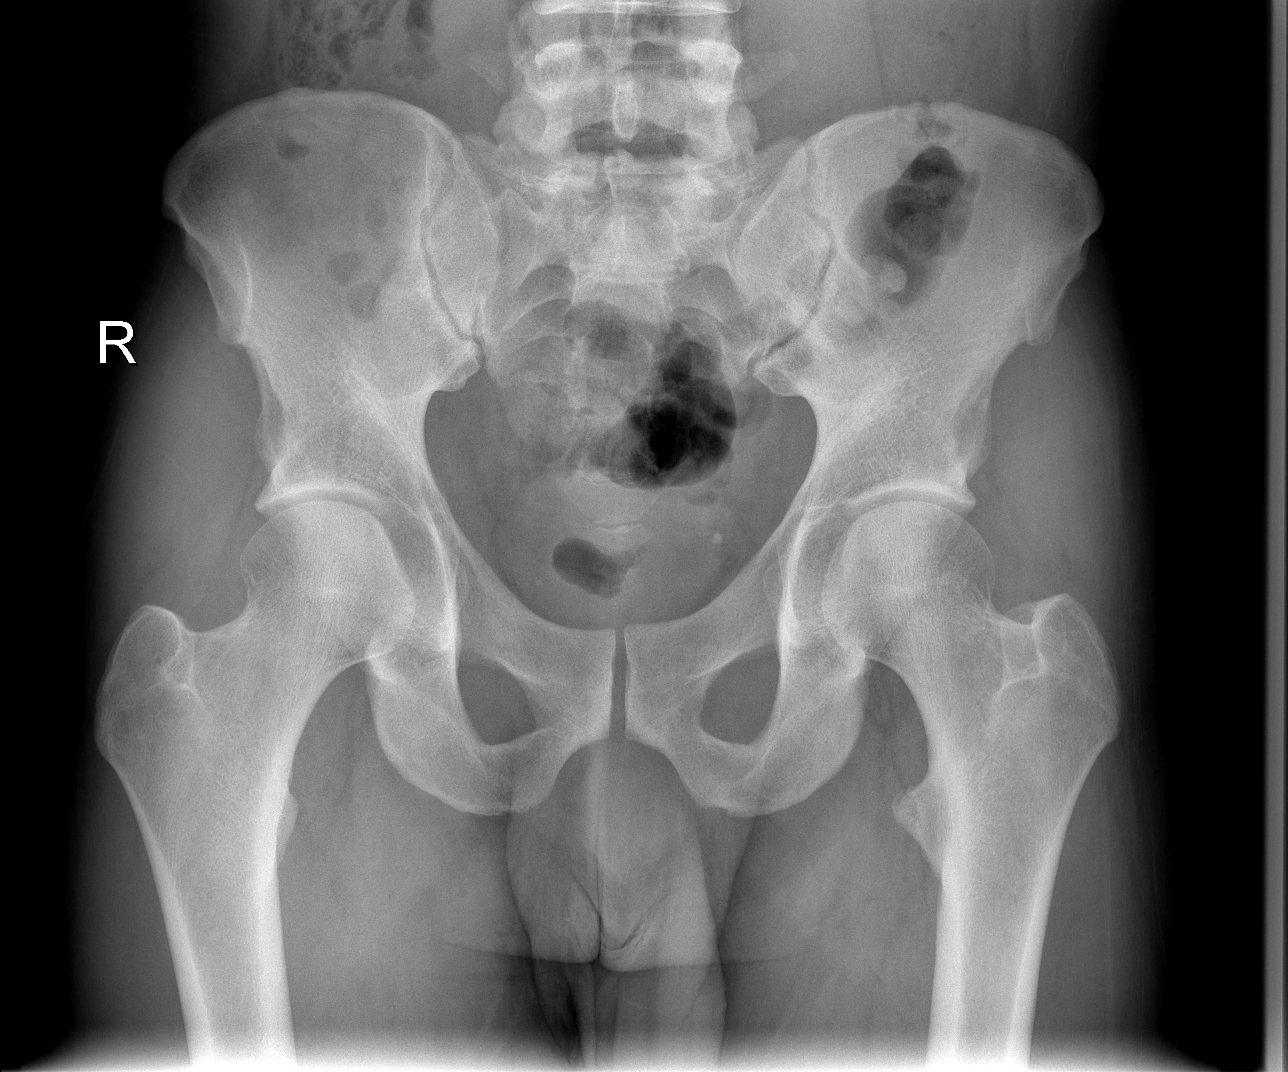

[t hip ap right]
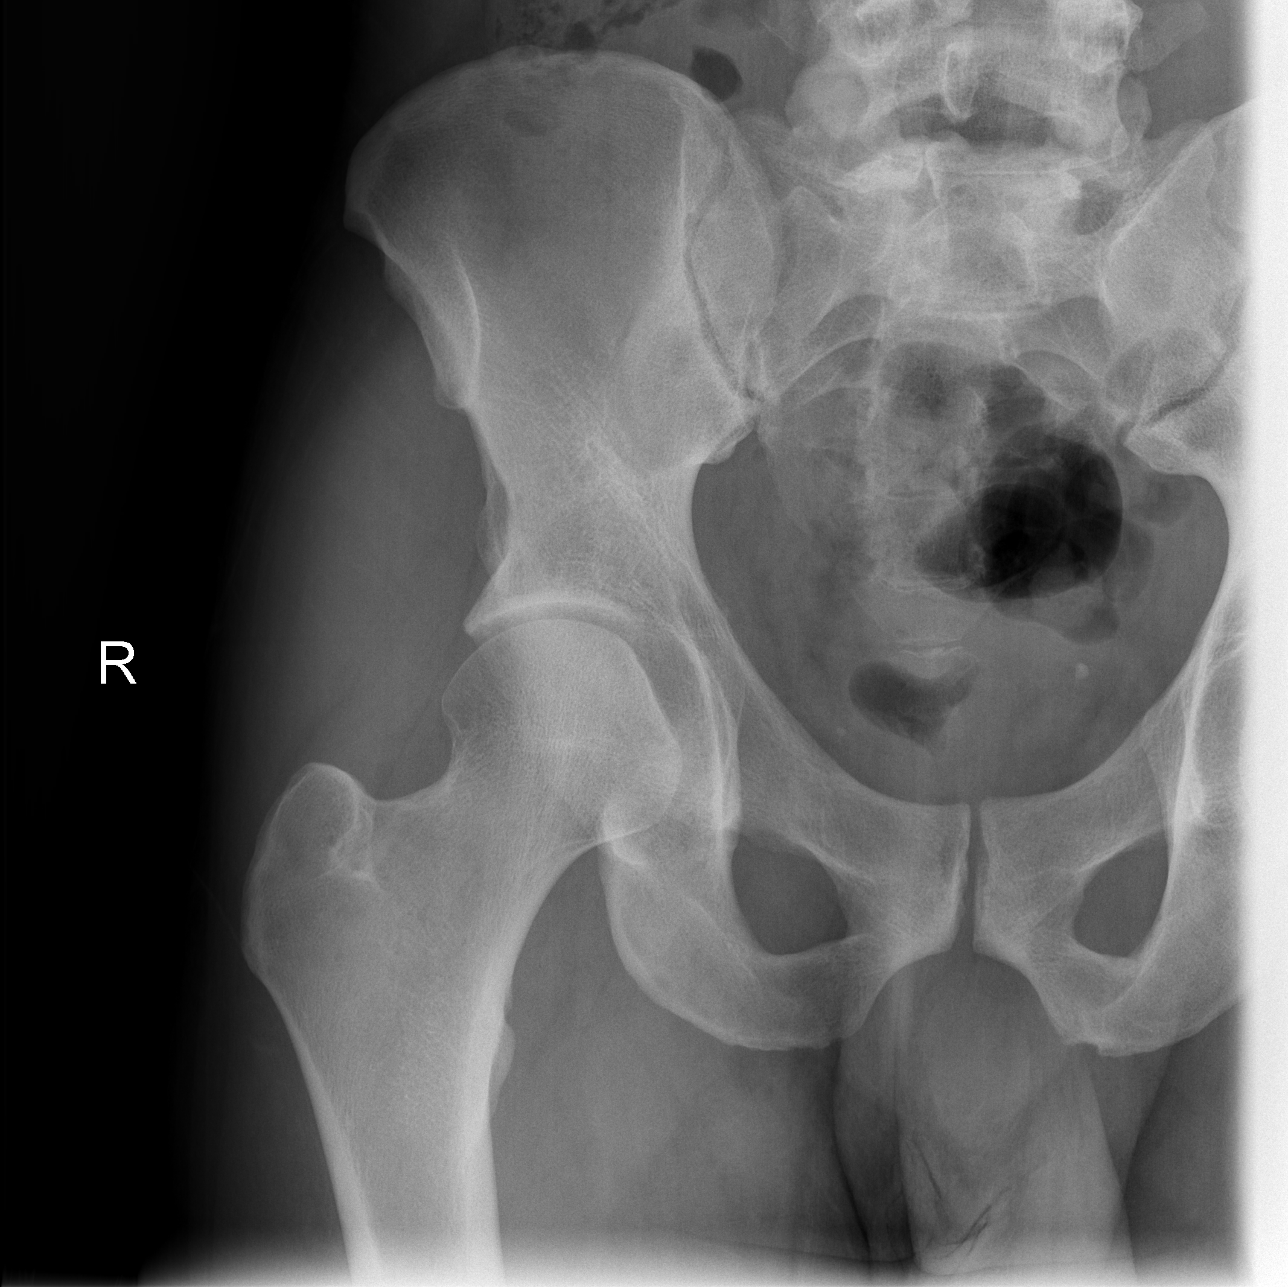

[t hip frog leg right]
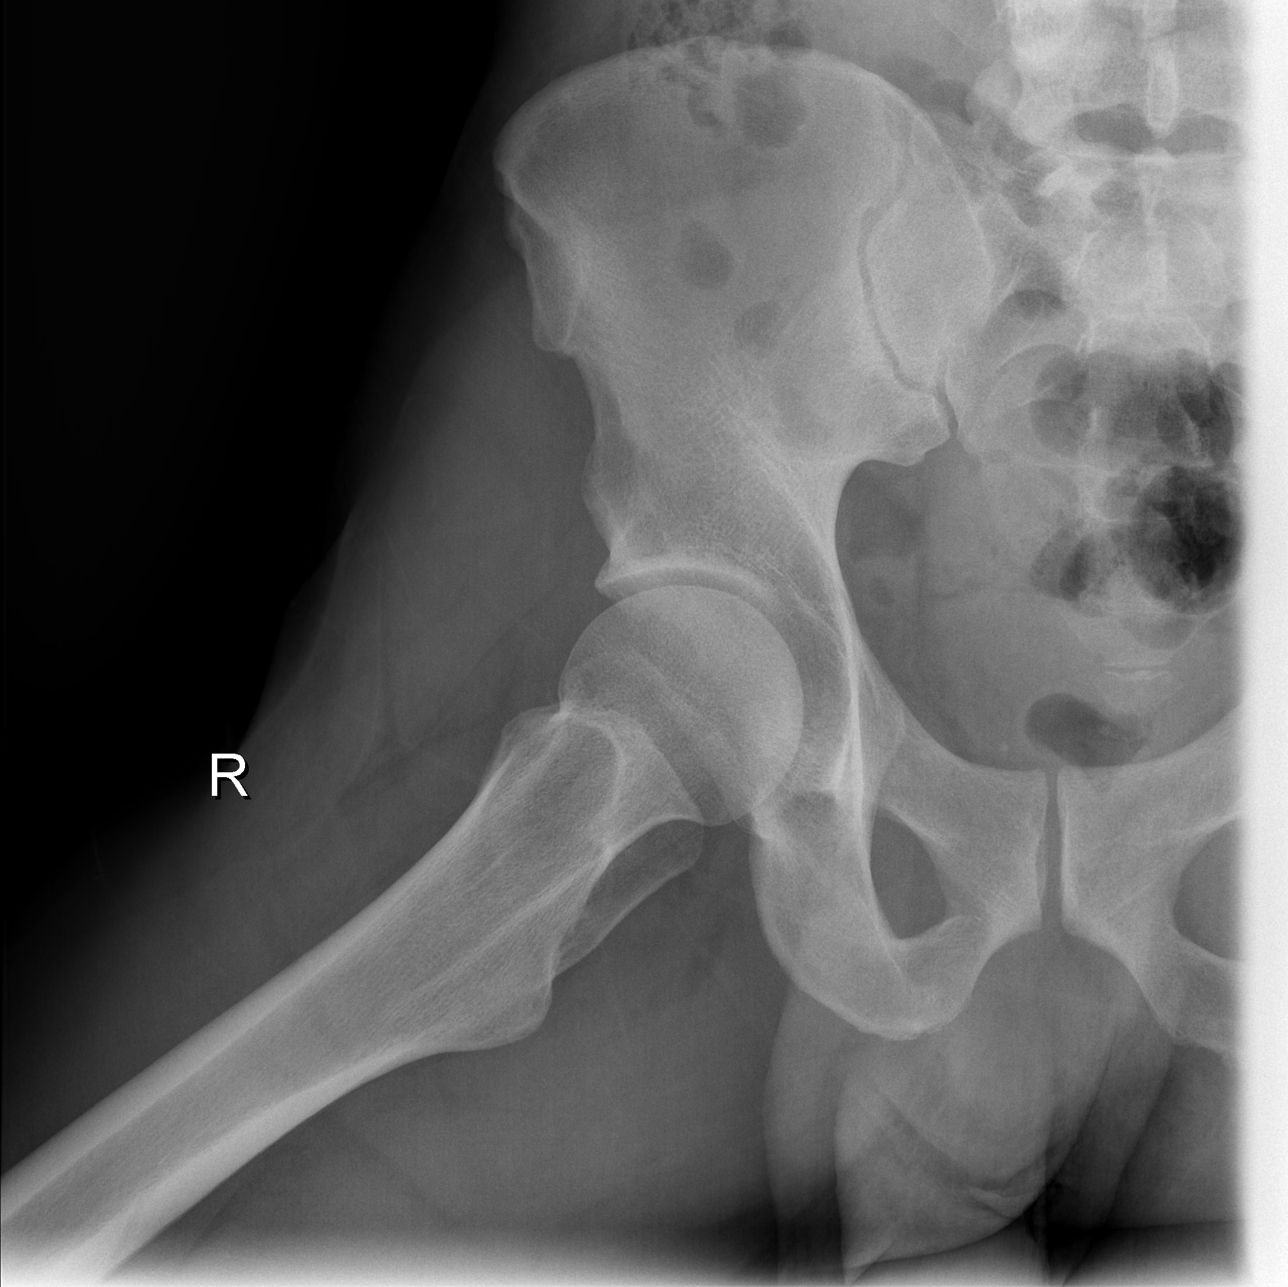

[3 of 3 positions shown; findings below may reference images not displayed]

FINDINGS: AP view pelvis demonstrates both femoral heads to be
located.  Sacroiliac joints are symmetric.

Phleboliths in the pelvis.

2 views of the right hip demonstrate no acute fracture or
dislocation.  Joint spaces maintained.
IMPRESSION: No acute findings.

## 2013-05-19 ENCOUNTER — Encounter: Payer: Self-pay | Admitting: Family Medicine

## 2013-05-19 ENCOUNTER — Ambulatory Visit (INDEPENDENT_AMBULATORY_CARE_PROVIDER_SITE_OTHER): Payer: No Typology Code available for payment source | Admitting: Family Medicine

## 2013-05-19 VITALS — BP 143/96 | HR 84 | Ht 72.0 in | Wt 205.0 lb

## 2013-05-19 DIAGNOSIS — F411 Generalized anxiety disorder: Secondary | ICD-10-CM

## 2013-05-19 DIAGNOSIS — F410 Panic disorder [episodic paroxysmal anxiety] without agoraphobia: Secondary | ICD-10-CM

## 2013-05-19 DIAGNOSIS — F419 Anxiety disorder, unspecified: Secondary | ICD-10-CM

## 2013-05-19 MED ORDER — BUSPIRONE HCL 15 MG PO TABS: 15.0000 mg | ORAL_TABLET | Freq: Three times a day (TID) | ORAL | Status: DC

## 2013-05-19 NOTE — Assessment & Plan Note (Signed)
Assessment: GAD = 17, moderate-severe anxiety Plan: given patient taking risperdone and already has two mental health providers, I don't want to make any major changes - Start buspar 15 mg TID until meeting with psychiatrist from whom I have requested records

## 2013-05-19 NOTE — Patient Instructions (Signed)
Dear Mr. Gasparini,   The new medication for anxiety is called Buspar. It is available at [facility] for 90 pills for $10. This new medication should help decrease your anxiety. Please take it 3 times a day until you see Dr. Bufford Buttner. At that time, please ask her about medications that may help with anxiety. Also, please ask her to fax me the records from your visit. Lastly, please take with the counselor about relaxation techniques like focused breathing. This will be very helpful for you.   Please follow up in 4 weeks to address your blood pressure.   Sincerely,   Dr. Clinton Sawyer

## 2013-05-19 NOTE — Progress Notes (Signed)
  Subjective:    Patient ID: Phillip Gill, male    DOB: 1976/05/07, 37 y.o.   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

## 2013-05-20 ENCOUNTER — Ambulatory Visit: Payer: No Typology Code available for payment source | Admitting: Family Medicine

## 2013-06-05 ENCOUNTER — Encounter (INDEPENDENT_AMBULATORY_CARE_PROVIDER_SITE_OTHER): Payer: PRIVATE HEALTH INSURANCE | Admitting: General Surgery

## 2013-06-08 ENCOUNTER — Encounter (INDEPENDENT_AMBULATORY_CARE_PROVIDER_SITE_OTHER): Payer: PRIVATE HEALTH INSURANCE | Admitting: General Surgery

## 2013-06-16 ENCOUNTER — Ambulatory Visit: Payer: Self-pay | Admitting: Family Medicine

## 2013-06-17 ENCOUNTER — Encounter (INDEPENDENT_AMBULATORY_CARE_PROVIDER_SITE_OTHER): Payer: Self-pay | Admitting: General Surgery

## 2013-08-10 ENCOUNTER — Other Ambulatory Visit (INDEPENDENT_AMBULATORY_CARE_PROVIDER_SITE_OTHER): Payer: Self-pay

## 2013-08-10 DIAGNOSIS — B20 Human immunodeficiency virus [HIV] disease: Secondary | ICD-10-CM

## 2013-08-11 LAB — HIV-1 RNA QUANT-NO REFLEX-BLD
HIV 1 RNA Quant: <20 copies/mL (ref <20)
HIV-1 RNA Quant, Log: <1.3 {Log} (ref <1.30)

## 2013-08-11 LAB — T-HELPER CELL (CD4) - (RCID CLINIC ONLY)
CD4 T CELL HELPER: 29 % — AB (ref 33–55)
CD4 T Cell Abs: 1140 /uL (ref 400–2700)

## 2013-08-20 ENCOUNTER — Other Ambulatory Visit: Payer: Self-pay | Admitting: Internal Medicine

## 2013-08-22 ENCOUNTER — Other Ambulatory Visit: Payer: Self-pay | Admitting: Internal Medicine

## 2013-08-27 ENCOUNTER — Ambulatory Visit: Payer: No Typology Code available for payment source | Admitting: Internal Medicine

## 2013-09-03 ENCOUNTER — Ambulatory Visit: Payer: No Typology Code available for payment source | Admitting: Internal Medicine

## 2013-09-08 ENCOUNTER — Ambulatory Visit: Payer: No Typology Code available for payment source | Admitting: Infectious Disease

## 2013-09-16 ENCOUNTER — Encounter: Payer: Self-pay | Admitting: Infectious Disease

## 2013-09-16 ENCOUNTER — Ambulatory Visit (INDEPENDENT_AMBULATORY_CARE_PROVIDER_SITE_OTHER): Payer: Self-pay | Admitting: Infectious Disease

## 2013-09-16 VITALS — BP 154/112 | HR 72 | Temp 98.2°F | Ht 72.0 in | Wt 198.0 lb

## 2013-09-16 DIAGNOSIS — I10 Essential (primary) hypertension: Secondary | ICD-10-CM

## 2013-09-16 DIAGNOSIS — B20 Human immunodeficiency virus [HIV] disease: Secondary | ICD-10-CM

## 2013-09-16 DIAGNOSIS — G43909 Migraine, unspecified, not intractable, without status migrainosus: Secondary | ICD-10-CM

## 2013-09-16 DIAGNOSIS — A630 Anogenital (venereal) warts: Secondary | ICD-10-CM

## 2013-09-16 MED ORDER — EMTRICITABINE-TENOFOVIR DF 200-300 MG PO TABS: 1.0000 | ORAL_TABLET | Freq: Every day | ORAL | Status: DC

## 2013-09-16 MED ORDER — DARUNAVIR-COBICISTAT 800-150 MG PO TABS: 1.0000 | ORAL_TABLET | Freq: Every day | ORAL | Status: DC

## 2013-09-16 NOTE — Progress Notes (Signed)
  Subjective:    Patient ID: Phillip Gill, male    DOB: 05-29-1976, 38 y.o.   MRN: [id number]  HPI   38 year old [place] man with HIV nicely controlled with prezista norvir and truvada.  He is here for followup. His migraine HA are less in severity though BP is up today     Review of Systems  Constitutional: Negative for fever, chills, diaphoresis, activity change, appetite change, fatigue and unexpected weight change.  HENT: Negative for congestion, rhinorrhea, sinus pressure, sneezing, sore throat and trouble swallowing.   Eyes: Negative for photophobia and visual disturbance.  Respiratory: Negative for cough, chest tightness, shortness of breath, wheezing and stridor.   Cardiovascular: Negative for chest pain, palpitations and leg swelling.  Gastrointestinal: Negative for nausea, vomiting, abdominal pain, diarrhea, constipation, blood in stool, abdominal distention and anal bleeding.  Genitourinary: Negative for dysuria, hematuria, flank pain and difficulty urinating.  Musculoskeletal: Negative for arthralgias, back pain, gait problem, joint swelling and myalgias.  Skin: Negative for color change, pallor, rash and wound.  Neurological: Negative for dizziness, tremors, weakness and light-headedness.  Hematological: Negative for adenopathy. Does not bruise/bleed easily.  Psychiatric/Behavioral: Negative for behavioral problems, confusion, sleep disturbance, dysphoric mood, decreased concentration and agitation.       Objective:   Physical Exam  Constitutional: He is oriented to person, place, and time. He appears well-developed and well-nourished. No distress.  HENT:  Head: Normocephalic and atraumatic.  Mouth/Throat: Oropharynx is clear and moist. No oropharyngeal exudate.  Eyes: Conjunctivae and EOM are normal. Pupils are equal, round, and reactive to light. No scleral icterus.  Neck: Normal range of motion. Neck supple. No JVD present.  Cardiovascular: Normal  rate, regular rhythm and normal heart sounds.  Exam reveals no gallop and no friction rub.   No murmur heard. Pulmonary/Chest: Effort normal and breath sounds normal. No respiratory distress. He has no wheezes. He has no rales. He exhibits no tenderness.  Abdominal: He exhibits no distension and no mass. There is no tenderness. There is no rebound and no guarding.  Musculoskeletal: He exhibits no edema and no tenderness.  Lymphadenopathy:    He has no cervical adenopathy.  Neurological: He is alert and oriented to person, place, and time. He has normal reflexes. He exhibits normal muscle tone. Coordination normal.  Skin: Skin is warm and dry. He is not diaphoretic. No erythema. No pallor.  Psychiatric: He has a normal mood and affect. His behavior is normal. Judgment and thought content normal.          Assessment & Plan:   HIV: change to prezcobix and truvada, check labs in 1-2 months. I spent greater than 40 minutes with the patient including greater than 50% of time in face to face counsel of the patient and in coordination of their care.  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

## 2013-09-25 ENCOUNTER — Ambulatory Visit
Admission: RE | Admit: 2013-09-25 | Discharge: 2013-09-25 | Disposition: A | Discharge status: Home / Self Care | Payer: No Typology Code available for payment source | Source: Ambulatory Visit | Attending: Family Medicine | Admitting: Family Medicine

## 2013-09-25 ENCOUNTER — Encounter: Payer: Self-pay | Admitting: Family Medicine

## 2013-09-25 ENCOUNTER — Ambulatory Visit (INDEPENDENT_AMBULATORY_CARE_PROVIDER_SITE_OTHER): Payer: No Typology Code available for payment source | Admitting: Family Medicine

## 2013-09-25 VITALS — BP 136/97 | HR 87 | Ht 72.0 in | Wt 202.0 lb

## 2013-09-25 DIAGNOSIS — M25519 Pain in unspecified shoulder: Secondary | ICD-10-CM

## 2013-09-25 DIAGNOSIS — I10 Essential (primary) hypertension: Secondary | ICD-10-CM

## 2013-09-25 DIAGNOSIS — B20 Human immunodeficiency virus [HIV] disease: Secondary | ICD-10-CM

## 2013-09-25 DIAGNOSIS — Z23 Encounter for immunization: Secondary | ICD-10-CM

## 2013-09-25 DIAGNOSIS — M25512 Pain in left shoulder: Secondary | ICD-10-CM

## 2013-09-25 MED ORDER — HYDROCODONE-ACETAMINOPHEN 5-325 MG PO TABS: 1.0000 | ORAL_TABLET | Freq: Four times a day (QID) | ORAL | Status: DC | PRN

## 2013-09-25 NOTE — Assessment & Plan Note (Signed)
Assessment: patient has a sustained injury to left a.c. Joint Plan: obtain plain films, give Norco, treat accordingly

## 2013-09-25 NOTE — Progress Notes (Signed)
   Subjective:    Patient ID: Phillip HalstedShanna M Boise, male    DOB: 03-09-1976, 38 y.o.   MRN: [id number]  HPI  38 year old M who presented for HTN f/u. Pt with new complaint of left shoulder pain.   Hypertension  Home BP monitoring: no  Office BP: BP Readings from Last 3 Encounters:  09/25/13 136/97  09/16/13 154/112  05/19/13 143/96    Prescribed meds: see below  Hypertension ROS:  Taking medications as prescribed:Yes Chest pain: No Shortness of breath: No Swelling of extremities: No TIA symptoms: No Regular exercise: No Low Na+ diet: No Alcohol/tobacco/drug use: Yes - cigarettes   HIV - well controlled with last CD4 count of > 1100; managed by infectious disease; pt does qualify for prevnar 13 for additional pneumonia coverage   Left Shoulder Pain -  One-month duration, started after a fall on his shoulder with his arm at his side, associated with swelling and decreased range of motion, has been improving slightly but still painful with extension flexion and abduction of the left shoulder, not taking anything for pain  Current Outpatient Prescriptions on File Prior to Visit  Medication Sig Dispense Refill  . Darunavir-Cobicistat (PREZCOBIX) 800-150 MG TABS Take 1 tablet by mouth daily.  30 tablet  11  . emtricitabine-tenofovir (TRUVADA) 200-300 MG per tablet Take 1 tablet by mouth daily.  30 tablet  11  . lisinopril (PRINIVIL,ZESTRIL) 20 MG tablet TAKE 1 TABLET BY MOUTH EVERY DAY  30 tablet  0  . loratadine (CLARITIN) 10 MG tablet Take 10 mg by mouth daily.      . propranolol (INDERAL) 40 MG tablet Take 0.5 tablets (20 mg total) by mouth 2 (two) times daily.  30 tablet  5  . risperiDONE (RISPERDAL) 2 MG tablet Take 2 mg by mouth every evening.       . [DISCONTINUED] gabapentin (NEURONTIN) 300 MG capsule Take 1 capsule (300 mg total) by mouth 3 (three) times daily.  90 capsule  3   No current facility-administered medications on file prior to visit.      Review of  Systems See HPI    Objective:   Physical Exam BP 136/97  Pulse 87  Ht 6' (1.829 m)  Wt 202 lb (91.627 kg)  BMI 27.39 kg/m2  Gen: well appearing, non distressed CV: RRR, 2+ peripheral pulses  Shoulder Exam - left Inspection:  Palpation:   Clavicle: non tender   AC Joint: marked tenderness and swelling of the left ACT joint compared to right   Scapula: normal             Biceps Tendon: mild tenderness ROM/Strength:  Abduction (Suprapsinatus): limited  Internal Rotation/Liftoff (Subsapularis):normal  External Rotation (Infraspinatus/Teres Minor): normal Maneuvers:   Neer's: equivocal  Hawkin's: Negative  Empty Can/Jobes for supraspinatus: Negative  Drop Arm: Negative Obrien's test for labrum tear: Negative Speed's: Negative Yergenson's: Negative        Assessment & Plan:

## 2013-09-25 NOTE — Assessment & Plan Note (Signed)
Give prevnar 13 for additional pneumonia immunization

## 2013-09-25 NOTE — Assessment & Plan Note (Signed)
Assessment: improved control lisinopril compared to previous measurement; patient still eating high sodium diet, no exercise and smoking which inhibits improvement in BP Plan: continue lisinopril, conuseled smoking cessation and low sodium diet

## 2013-09-25 NOTE — Patient Instructions (Signed)
Mr. Weinfeld,   [facility] to see you today.   Blood Pressure - please continue lisinopril daily please increase your exercise to 30 minutes daily. Please decrease the amount of high salt foods like hot pockets and hot dogs. Also quitting smoking will help. Please call the number for smoking cessation to see if he can get assistance with the nicotine patches.  Left shoulder - much get an x-ray to make sure her fracture of that joint. Use the pain medication as needed. Take ibuprofen as well. Continue to work on range of motion.  Sincerely,   Dr. Clinton SawyerWilliamson

## 2013-09-28 ENCOUNTER — Telehealth: Payer: Self-pay | Admitting: Family Medicine

## 2013-09-28 NOTE — Telephone Encounter (Signed)
Pt informed of normal X-ray. No further work up at this time. Pt acknowledged understanding.

## 2013-10-21 ENCOUNTER — Telehealth: Payer: Self-pay | Admitting: Family Medicine

## 2013-10-21 NOTE — Telephone Encounter (Signed)
Will fwd to PCP for review. Thanks. .Phillip Gill  

## 2013-10-21 NOTE — Telephone Encounter (Signed)
[name] need a new referral for a  f/u appt with [facility].  Please inform when processed

## 2013-10-22 ENCOUNTER — Telehealth: Payer: Self-pay | Admitting: *Deleted

## 2013-10-22 NOTE — Telephone Encounter (Signed)
Received message from patient requesting referral to Dr. Maisie Fushomas for follow up of his HRA from 03/2013.  Patient no-showed his 06/2013 followup appointment.  RN contacted Dr. Maurine Ministerhomas's office.  Because of the no-show, they will need for him to be re-referred through [facility].  RN informed the patient, notified him that he may be denied the referral d/t his no show.  Patient verbalized understanding.  Will send referral in May. Andree CossMichelle M Cason Luffman, RN

## 2013-10-22 NOTE — Telephone Encounter (Signed)
Attempted to call pt. I do not understand why a patient would need a referral for follow up. Please call and ask what he is following up for and why he needs a referral. If he has Medicaid, then he needs an appointment before a referral can be made.

## 2013-10-22 NOTE — Telephone Encounter (Signed)
Instructed patient to make appt with PCP here to discuss referral.  [facility] stated to pt he needed referral since it had been so long since he was seen by them.  Radene OuKristen L Zniyah Midkiff, CMA

## 2013-11-05 ENCOUNTER — Other Ambulatory Visit: Payer: Self-pay | Admitting: Internal Medicine

## 2013-11-21 ENCOUNTER — Other Ambulatory Visit: Payer: Self-pay | Admitting: Family Medicine

## 2013-11-25 ENCOUNTER — Encounter: Payer: Self-pay | Admitting: Family Medicine

## 2013-11-25 ENCOUNTER — Ambulatory Visit (INDEPENDENT_AMBULATORY_CARE_PROVIDER_SITE_OTHER): Payer: No Typology Code available for payment source | Admitting: Family Medicine

## 2013-11-25 VITALS — BP 133/94 | HR 81 | Temp 98.6°F | Ht 72.0 in | Wt 212.2 lb

## 2013-11-25 DIAGNOSIS — M25519 Pain in unspecified shoulder: Secondary | ICD-10-CM

## 2013-11-25 DIAGNOSIS — M25512 Pain in left shoulder: Secondary | ICD-10-CM

## 2013-11-25 MED ORDER — LIDOCAINE HCL 2 % IJ SOLN
5.0000 mL | Freq: Once | INTRAMUSCULAR | Status: AC
  Administered 2013-11-25: 100 mg

## 2013-11-25 NOTE — Patient Instructions (Signed)
Mr. Poston,   I think that you have tendonitis of the biceps. You need to do the following things:  - take ibuprofen 400-800 mg every 8 hours for inflammation - do the exercises on the sheet 3 times per day - ice after the exercises  Follow up in 1 month.   Sincerely,   Dr. Clinton Sawyer

## 2013-11-25 NOTE — Progress Notes (Signed)
   Subjective:    Patient ID: Phillip Gill, male    DOB: Jun 04, 1976, 39 y.o.   MRN: [id number]  HPI  38 year old male with HIV, hypertension, and fibromyalgia who presents for followup of left shoulder pain  Left shoulder - This started acutely after a fall this left side in January 2015. He was initially evaluated for this problem in my clinic 2 months ago. Distal examination is concerned that he had an injury to the left a.c. Joint. X-rays of the left shoulder were normal. He is prescribed Norco for pain relief. The patient notes today that the pain in persistent in the anterior superior left shoulder. It worsened by lifting heavy objects. He denies radiation of the pain. He did not fill the prescription for Norco, because he did not have the money.   Review of Systems Negative for numbness of the extremities     Objective:   Physical Exam BP 133/94  Pulse 81  Temp(Src) 98.6 F (37 C) (Oral)  Ht 6' (1.829 m)  Wt 212 lb 3.2 oz (96.253 kg)  BMI 28.77 kg/m2    Gen: well appearing, non distressed  CV: RRR, 2+ peripheral pulses  Shoulder Exam - left  Inspection: mild swelling on superior left shoulder joint above AC joint Palpation:  Clavicle: non tender  AC Joint: non tender but appears swollen on left compared to right  Scapula: normal  Biceps Tendon: marked tenderness ROM/Strength:  Abduction (Suprapsinatus): normal Internal Rotation/Liftoff (Subsapularis):normal  External Rotation (Infraspinatus/Teres Minor): normal  Maneuvers:  Neer's: equivocal  Hawkin's: Negative  Empty Can/Jobes for supraspinatus: Negative  Drop Arm: Negative  Obrien's test for labrum tear: Negative  Speed's: Negative  Yergenson's: Negative       Assessment & Plan:   Bedside ultrasound: anterior shoulder showed long head of biceps in bicepital groove of humeral head and no surround edema; and insertion of subscapularis with normal muscle fibers and no areas of hypoechogenicity   Tendon  Injection:  - Consent obtained - Skin prepped with alcohol - 5 cmm of lidocaine were injected into the left long head of the of biceps tendon for the purpose of diagnosis of tendonitis - Pt tolerated procedure well

## 2013-11-26 NOTE — Assessment & Plan Note (Signed)
A: persistent pain over biceps tendon near groove, and shoulder with full ROM and strength, so working diagnosis is biceps tendon tendonitis P:  - injection of lidocaine for pain relief - given information and exercises routine to carry out - daily NSAIDS - ice after use  - f/u in 4 weeks, if not better consider Sports medicine for a more comprehensive ultrasound

## 2013-12-07 ENCOUNTER — Other Ambulatory Visit (INDEPENDENT_AMBULATORY_CARE_PROVIDER_SITE_OTHER): Payer: Self-pay

## 2013-12-07 DIAGNOSIS — B20 Human immunodeficiency virus [HIV] disease: Secondary | ICD-10-CM

## 2013-12-07 LAB — CBC WITH DIFFERENTIAL/PLATELET
BASOS ABS: 0 10*3/uL (ref 0.0–0.1)
Basophils Relative: 0 % (ref 0–1)
EOS ABS: 0.1 10*3/uL (ref 0.0–0.7)
Eosinophils Relative: 1 % (ref 0–5)
HCT: 40.8 % (ref 39.0–52.0)
Hemoglobin: 14 g/dL (ref 13.0–17.0)
LYMPHS PCT: 36 % (ref 12–46)
Lymphs Abs: 3.4 10*3/uL (ref 0.7–4.0)
MCH: 30 pg (ref 26.0–34.0)
MCHC: 34.3 g/dL (ref 30.0–36.0)
MCV: 87.4 fL (ref 78.0–100.0)
Monocytes Absolute: 0.8 10*3/uL (ref 0.1–1.0)
Monocytes Relative: 8 % (ref 3–12)
NEUTROS PCT: 55 % (ref 43–77)
Neutro Abs: 5.2 10*3/uL (ref 1.7–7.7)
PLATELETS: 169 10*3/uL (ref 150–400)
RBC: 4.67 MIL/uL (ref 4.22–5.81)
RDW: 14.7 % (ref 11.5–15.5)
WBC: 9.5 10*3/uL (ref 4.0–10.5)

## 2013-12-07 LAB — COMPLETE METABOLIC PANEL WITH GFR
ALT: 145 U/L — AB (ref 0–53)
AST: 88 U/L — ABNORMAL HIGH (ref 0–37)
Albumin: 4.2 g/dL (ref 3.5–5.2)
Alkaline Phosphatase: 76 U/L (ref 39–117)
BILIRUBIN TOTAL: 0.6 mg/dL (ref 0.2–1.2)
BUN: 16 mg/dL (ref 6–23)
CHLORIDE: 103 meq/L (ref 96–112)
CO2: 32 meq/L (ref 19–32)
Calcium: 9.2 mg/dL (ref 8.4–10.5)
Creat: 1.43 mg/dL — ABNORMAL HIGH (ref 0.50–1.35)
GFR, EST AFRICAN AMERICAN: 72 mL/min
GFR, Est Non African American: 62 mL/min
Glucose, Bld: 96 mg/dL (ref 70–99)
Potassium: 4 mEq/L (ref 3.5–5.3)
SODIUM: 139 meq/L (ref 135–145)
TOTAL PROTEIN: 7.4 g/dL (ref 6.0–8.3)

## 2013-12-08 LAB — T-HELPER CELL (CD4) - (RCID CLINIC ONLY)
CD4 % Helper T Cell: 34 % (ref 33–55)
CD4 T Cell Abs: 1110 /uL (ref 400–2700)

## 2013-12-08 LAB — HIV-1 RNA QUANT-NO REFLEX-BLD: HIV-1 RNA Quant, Log: <1.3 {Log} (ref <1.30)

## 2013-12-21 ENCOUNTER — Ambulatory Visit: Payer: No Typology Code available for payment source | Admitting: Family Medicine

## 2013-12-21 ENCOUNTER — Ambulatory Visit: Payer: Self-pay | Admitting: Infectious Disease

## 2013-12-25 ENCOUNTER — Telehealth: Payer: Self-pay | Admitting: Family Medicine

## 2013-12-25 DIAGNOSIS — I10 Essential (primary) hypertension: Secondary | ICD-10-CM

## 2013-12-25 MED ORDER — METOPROLOL TARTRATE 25 MG PO TABS: 25.0000 mg | ORAL_TABLET | Freq: Two times a day (BID) | ORAL | Status: DC

## 2013-12-25 NOTE — Telephone Encounter (Signed)
ADAP will no longer cover the [facility]anolol [facility]  Pt need to have another med comparable to that so ADAP will cover it.  Please contact him to advise

## 2013-12-25 NOTE — Telephone Encounter (Signed)
I sent in a prescription for metoprolol. I have no way to know if ADAP will pay for this. Please let him know that he should ask was medicines are covered if this is not.

## 2013-12-28 NOTE — Telephone Encounter (Signed)
Called and informed patient that the Rx was switched to metoprolol and if that one is not covered by MAP he would need to find out which medication will be covered.Phillip Gill, Phillip Medinobert Lee

## 2013-12-29 ENCOUNTER — Encounter: Payer: Self-pay | Admitting: Infectious Disease

## 2013-12-29 ENCOUNTER — Ambulatory Visit (INDEPENDENT_AMBULATORY_CARE_PROVIDER_SITE_OTHER): Payer: No Typology Code available for payment source | Admitting: Infectious Disease

## 2013-12-29 VITALS — BP 146/97 | HR 86 | Temp 98.8°F | Wt 209.0 lb

## 2013-12-29 DIAGNOSIS — F17200 Nicotine dependence, unspecified, uncomplicated: Secondary | ICD-10-CM

## 2013-12-29 DIAGNOSIS — A630 Anogenital (venereal) warts: Secondary | ICD-10-CM

## 2013-12-29 DIAGNOSIS — B20 Human immunodeficiency virus [HIV] disease: Secondary | ICD-10-CM

## 2013-12-29 DIAGNOSIS — N289 Disorder of kidney and ureter, unspecified: Secondary | ICD-10-CM | POA: Insufficient documentation

## 2013-12-29 DIAGNOSIS — Z113 Encounter for screening for infections with a predominantly sexual mode of transmission: Secondary | ICD-10-CM

## 2013-12-29 DIAGNOSIS — N179 Acute kidney failure, unspecified: Secondary | ICD-10-CM

## 2013-12-29 HISTORY — DX: Disorder of kidney and ureter, unspecified: N28.9

## 2013-12-29 LAB — BASIC METABOLIC PANEL WITH GFR
BUN: 11 mg/dL (ref 6–23)
CALCIUM: 8.7 mg/dL (ref 8.4–10.5)
CO2: 28 mEq/L (ref 19–32)
Chloride: 105 mEq/L (ref 96–112)
Creat: 1.05 mg/dL (ref 0.50–1.35)
GFR, Est African American: >89 mL/min
GFR, Est Non African American: >89 mL/min
GLUCOSE: 94 mg/dL (ref 70–99)
Potassium: 3.5 mEq/L (ref 3.5–5.3)
Sodium: 139 mEq/L (ref 135–145)

## 2013-12-29 LAB — PHOSPHORUS: PHOSPHORUS: 2.8 mg/dL (ref 2.3–4.6)

## 2013-12-29 LAB — RPR

## 2013-12-29 MED ORDER — VARENICLINE TARTRATE 0.5 MG X 11 & 1 MG X 42 PO MISC: ORAL | Status: DC

## 2013-12-29 MED ORDER — VARENICLINE TARTRATE 1 MG PO TABS: 1.0000 mg | ORAL_TABLET | Freq: Two times a day (BID) | ORAL | Status: DC

## 2013-12-29 NOTE — Progress Notes (Signed)
Subjective:    Patient ID: Phillip Gill, male    DOB: 28-Apr-1976, 38 y.o.   MRN: [id number]  HPI   38 year old [place] man with HIV nicely controlled with prezista norvir and truvada.  He is here for followup.   Still wanting to have some genital warts seen by general surgery but he has missed an appointment with them and is needing to have his arms card validated again.  He is reenrolled in the AIDS drug assistance program but will need renewal again for the September.line. We viewed his labs was shown to have an undetectable viral load and healthy CD4 count of about thousand.  History and creatinine has bumped up to 1.43, value of 1.13 when seen in September. This is higher than the expected elevation in serum creatinine along with expectant changing her time here to cover suspected boosting I would note he is also on ACE inhibitor.    Review of Systems  Constitutional: Negative for fever, chills, diaphoresis, activity change, appetite change, fatigue and unexpected weight change.  HENT: Negative for congestion, rhinorrhea, sinus pressure, sneezing, sore throat and trouble swallowing.   Eyes: Negative for photophobia and visual disturbance.  Respiratory: Negative for cough, chest tightness, shortness of breath, wheezing and stridor.   Cardiovascular: Negative for chest pain, palpitations and leg swelling.  Gastrointestinal: Negative for nausea, vomiting, abdominal pain, diarrhea, constipation, blood in stool, abdominal distention and anal bleeding.  Genitourinary: Negative for dysuria, hematuria, flank pain and difficulty urinating.  Musculoskeletal: Negative for arthralgias, back pain, gait problem, joint swelling and myalgias.  Skin: Negative for color change, pallor, rash and wound.  Neurological: Negative for dizziness, tremors, weakness and light-headedness.  Hematological: Negative for adenopathy. Does not bruise/bleed easily.  Psychiatric/Behavioral: Negative for  behavioral problems, confusion, sleep disturbance, dysphoric mood, decreased concentration and agitation.       Objective:   Physical Exam  Constitutional: He is oriented to person, place, and time. He appears well-developed and well-nourished. No distress.  HENT:  Head: Normocephalic and atraumatic.  Mouth/Throat: Oropharynx is clear and moist. No oropharyngeal exudate.  Eyes: Conjunctivae and EOM are normal. Pupils are equal, round, and reactive to light. No scleral icterus.  Neck: Normal range of motion. Neck supple. No JVD present.  Cardiovascular: Normal rate, regular rhythm and normal heart sounds.  Exam reveals no gallop and no friction rub.   No murmur heard. Pulmonary/Chest: Effort normal and breath sounds normal. No respiratory distress. He has no wheezes. He has no rales. He exhibits no tenderness.  Abdominal: He exhibits no distension and no mass. There is no tenderness. There is no rebound and no guarding.  Musculoskeletal: He exhibits no edema and no tenderness.  Lymphadenopathy:    He has no cervical adenopathy.  Neurological: He is alert and oriented to person, place, and time. He has normal reflexes. He exhibits normal muscle tone. Coordination normal.  Skin: Skin is warm and dry. He is not diaphoretic. No erythema. No pallor.  Psychiatric: He has a normal mood and affect. His behavior is normal. Judgment and thought content normal.          Assessment & Plan:   HIV: Continue prezcobix and truvada,   Acute renal insufficiency: Would've expected his urine creatinine 2 gone up by about 0.14 so his creatinine of 1.4 is higher than one would expect with simply inhibition of tubular secretion of creatinine by his COBI.   Will check BMP with GFR serum phosphorus urinalysis with urine sodium  creatinine and microalbumin creatinine ratio. His renal function not improved and the there is not a clear-cut reason why he should have such a high serum creatinine may consider  further workup including renal ultrasound   I spent greater than 25 minutes with the patient including greater than 50% of time in face to face counsel of the patient and in coordination of their care.  Migraines: Being changed to metoprolol by primary care physician  BP: Is on a beta blocker and ACE, ambulatory bp and report back to PCP and to me  Genital warts: wishes for referral back to [facility] (he missed appt with them), try to facilitate this again but is missing appointments certainly not helping things   Smoker: spent > 3 minutes counselling to quit and wrote script for chantix.

## 2013-12-30 LAB — URINALYSIS, ROUTINE W REFLEX MICROSCOPIC
BILIRUBIN URINE: NEGATIVE
Glucose, UA: NEGATIVE mg/dL
Hgb urine dipstick: NEGATIVE
Ketones, ur: NEGATIVE mg/dL
Leukocytes, UA: NEGATIVE
Nitrite: NEGATIVE
PH: 5.5 (ref 5.0–8.0)
Protein, ur: NEGATIVE mg/dL
Specific Gravity, Urine: 1.012 (ref 1.005–1.030)
Urobilinogen, UA: 0.2 mg/dL (ref 0.0–1.0)

## 2013-12-30 LAB — MICROALBUMIN / CREATININE URINE RATIO
Creatinine, Urine: 226.3 mg/dL
MICROALB UR: 0.5 mg/dL (ref 0.00–1.89)
Microalb Creat Ratio: 2.2 mg/g (ref 0.0–30.0)

## 2013-12-30 LAB — CREATININE, URINE, RANDOM: CREATININE, URINE: 108.3 mg/dL

## 2013-12-30 LAB — SODIUM, URINE, RANDOM: Sodium, Ur: 73 mEq/L

## 2014-01-11 ENCOUNTER — Ambulatory Visit: Payer: No Typology Code available for payment source

## 2014-01-13 ENCOUNTER — Other Ambulatory Visit: Payer: Self-pay | Admitting: *Deleted

## 2014-01-13 DIAGNOSIS — B20 Human immunodeficiency virus [HIV] disease: Secondary | ICD-10-CM

## 2014-01-13 MED ORDER — DARUNAVIR-COBICISTAT 800-150 MG PO TABS: 1.0000 | ORAL_TABLET | Freq: Every day | ORAL | Status: DC

## 2014-01-13 MED ORDER — EMTRICITABINE-TENOFOVIR DF 200-300 MG PO TABS: 1.0000 | ORAL_TABLET | Freq: Every day | ORAL | Status: DC

## 2014-01-13 NOTE — Telephone Encounter (Signed)
ADAP Application 

## 2014-03-16 ENCOUNTER — Emergency Department (HOSPITAL_BASED_OUTPATIENT_CLINIC_OR_DEPARTMENT_OTHER)
Admission: EM | Admit: 2014-03-16 | Discharge: 2014-03-16 | Disposition: A | Discharge status: Home / Self Care | Payer: Self-pay | Attending: Emergency Medicine | Admitting: Emergency Medicine

## 2014-03-16 ENCOUNTER — Emergency Department (HOSPITAL_BASED_OUTPATIENT_CLINIC_OR_DEPARTMENT_OTHER): Payer: Self-pay

## 2014-03-16 ENCOUNTER — Encounter (HOSPITAL_BASED_OUTPATIENT_CLINIC_OR_DEPARTMENT_OTHER): Payer: Self-pay | Admitting: Radiology

## 2014-03-16 ENCOUNTER — Emergency Department (HOSPITAL_BASED_OUTPATIENT_CLINIC_OR_DEPARTMENT_OTHER): Payer: No Typology Code available for payment source

## 2014-03-16 DIAGNOSIS — F411 Generalized anxiety disorder: Secondary | ICD-10-CM | POA: Insufficient documentation

## 2014-03-16 DIAGNOSIS — F17200 Nicotine dependence, unspecified, uncomplicated: Secondary | ICD-10-CM | POA: Insufficient documentation

## 2014-03-16 DIAGNOSIS — R110 Nausea: Secondary | ICD-10-CM | POA: Insufficient documentation

## 2014-03-16 DIAGNOSIS — K5900 Constipation, unspecified: Secondary | ICD-10-CM | POA: Insufficient documentation

## 2014-03-16 DIAGNOSIS — B20 Human immunodeficiency virus [HIV] disease: Secondary | ICD-10-CM | POA: Insufficient documentation

## 2014-03-16 DIAGNOSIS — K589 Irritable bowel syndrome without diarrhea: Secondary | ICD-10-CM | POA: Insufficient documentation

## 2014-03-16 DIAGNOSIS — I10 Essential (primary) hypertension: Secondary | ICD-10-CM | POA: Insufficient documentation

## 2014-03-16 DIAGNOSIS — R1032 Left lower quadrant pain: Secondary | ICD-10-CM | POA: Insufficient documentation

## 2014-03-16 DIAGNOSIS — F329 Major depressive disorder, single episode, unspecified: Secondary | ICD-10-CM | POA: Insufficient documentation

## 2014-03-16 DIAGNOSIS — G43909 Migraine, unspecified, not intractable, without status migrainosus: Secondary | ICD-10-CM | POA: Insufficient documentation

## 2014-03-16 DIAGNOSIS — M129 Arthropathy, unspecified: Secondary | ICD-10-CM | POA: Insufficient documentation

## 2014-03-16 DIAGNOSIS — F3289 Other specified depressive episodes: Secondary | ICD-10-CM | POA: Insufficient documentation

## 2014-03-16 DIAGNOSIS — Z79899 Other long term (current) drug therapy: Secondary | ICD-10-CM | POA: Insufficient documentation

## 2014-03-16 LAB — URINALYSIS, ROUTINE W REFLEX MICROSCOPIC
BILIRUBIN URINE: NEGATIVE
Glucose, UA: NEGATIVE mg/dL
HGB URINE DIPSTICK: NEGATIVE
Ketones, ur: NEGATIVE mg/dL
Leukocytes, UA: NEGATIVE
Nitrite: NEGATIVE
PH: 5.5 (ref 5.0–8.0)
Protein, ur: NEGATIVE mg/dL
Specific Gravity, Urine: 1.028 (ref 1.005–1.030)
Urobilinogen, UA: 0.2 mg/dL (ref 0.0–1.0)

## 2014-03-16 LAB — CBC WITH DIFFERENTIAL/PLATELET
Basophils Absolute: 0 10*3/uL (ref 0.0–0.1)
Basophils Relative: 0 % (ref 0–1)
EOS PCT: 1 % (ref 0–5)
Eosinophils Absolute: 0.1 10*3/uL (ref 0.0–0.7)
HEMATOCRIT: 38.7 % — AB (ref 39.0–52.0)
HEMOGLOBIN: 12.7 g/dL — AB (ref 13.0–17.0)
LYMPHS ABS: 3.4 10*3/uL (ref 0.7–4.0)
Lymphocytes Relative: 41 % (ref 12–46)
MCH: 31.1 pg (ref 26.0–34.0)
MCHC: 32.8 g/dL (ref 30.0–36.0)
MCV: 94.9 fL (ref 78.0–100.0)
MONOS PCT: 8 % (ref 3–12)
Monocytes Absolute: 0.7 10*3/uL (ref 0.1–1.0)
Neutro Abs: 4.2 10*3/uL (ref 1.7–7.7)
Neutrophils Relative %: 50 % (ref 43–77)
Platelets: 135 10*3/uL — ABNORMAL LOW (ref 150–400)
RBC: 4.08 MIL/uL — AB (ref 4.22–5.81)
RDW: 12.7 % (ref 11.5–15.5)
WBC: 8.4 10*3/uL (ref 4.0–10.5)

## 2014-03-16 LAB — COMPREHENSIVE METABOLIC PANEL
ALBUMIN: 4 g/dL (ref 3.5–5.2)
ALT: 13 U/L (ref 0–53)
ANION GAP: 11 (ref 5–15)
AST: 22 U/L (ref 0–37)
Alkaline Phosphatase: 75 U/L (ref 39–117)
BILIRUBIN TOTAL: 0.3 mg/dL (ref 0.3–1.2)
BUN: 19 mg/dL (ref 6–23)
CHLORIDE: 104 meq/L (ref 96–112)
CO2: 27 mEq/L (ref 19–32)
Calcium: 9.3 mg/dL (ref 8.4–10.5)
Creatinine, Ser: 1.3 mg/dL (ref 0.50–1.35)
GFR calc Af Amer: 80 mL/min — ABNORMAL LOW (ref >90)
GFR calc non Af Amer: 69 mL/min — ABNORMAL LOW (ref >90)
Glucose, Bld: 92 mg/dL (ref 70–99)
POTASSIUM: 3.9 meq/L (ref 3.7–5.3)
Sodium: 142 mEq/L (ref 137–147)
Total Protein: 8 g/dL (ref 6.0–8.3)

## 2014-03-16 LAB — LIPASE, BLOOD: Lipase: 49 U/L (ref 11–59)

## 2014-03-16 MED ORDER — IOHEXOL 300 MG/ML  SOLN
100.0000 mL | Freq: Once | INTRAMUSCULAR | Status: AC | PRN
  Administered 2014-03-16: 100 mL via INTRAVENOUS

## 2014-03-16 MED ORDER — SODIUM CHLORIDE 0.9 % IV BOLUS (SEPSIS)
1000.0000 mL | Freq: Once | INTRAVENOUS | Status: AC
  Administered 2014-03-16: 1000 mL via INTRAVENOUS

## 2014-03-16 MED ORDER — ONDANSETRON HCL 4 MG/2ML IJ SOLN
4.0000 mg | Freq: Once | INTRAMUSCULAR | Status: AC
  Administered 2014-03-16: 4 mg via INTRAVENOUS
  Filled 2014-03-16: qty 2

## 2014-03-16 MED ORDER — DICYCLOMINE HCL 20 MG PO TABS: 20.0000 mg | ORAL_TABLET | Freq: Two times a day (BID) | ORAL | Status: DC

## 2014-03-16 MED ORDER — ONDANSETRON 4 MG PO TBDP: ORAL_TABLET | ORAL | Status: DC

## 2014-03-16 MED ORDER — IOHEXOL 300 MG/ML  SOLN
50.0000 mL | Freq: Once | INTRAMUSCULAR | Status: AC | PRN
  Administered 2014-03-16: 50 mL via ORAL

## 2014-03-16 NOTE — ED Notes (Signed)
PT walked to CT

## 2014-03-16 NOTE — Discharge Instructions (Signed)

## 2014-03-16 NOTE — ED Provider Notes (Addendum)
CSN: [id number]     Arrival date & time 03/16/14  0940 History   First MD Initiated Contact with Patient 03/16/14 [phone]     Chief Complaint  Patient presents with  . Abdominal Pain     (Consider location/radiation/quality/duration/timing/severity/associated sxs/prior Treatment) HPI Comments: Patient presents with abdominal pain. He states he was at work today and that he was lifting a box, he felt nauseated and started having some pain in his left abdomen. He states the pain was severe at the time it started but now has eased off. He denies any vomiting or fevers. He denies any urinary symptoms or hematuria. He's had some intermittent constipation but had a small bowel movement this morning. He denies any diarrhea.  Patient is a 38 y.o. male presenting with abdominal pain.  Abdominal Pain Associated symptoms: constipation and nausea   Associated symptoms: no chest pain, no chills, no cough, no diarrhea, no fatigue, no fever, no hematuria, no shortness of breath and no vomiting     Past Medical History  Diagnosis Date  . Anxiety   . Depression   . HIV infection     followed by Dr. Luciana Axe- sees him every 4 months  . Hypertension   . Anxiety   . Arthritis   . 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 Migraine   . IBS (irritable bowel syndrome)    Past Surgical History  Procedure Laterality Date  . Wisdom tooth extraction    . High resolution anoscopy N/A 02/11/2013    Procedure: HIGH RESOLUTION ANOSCOPY WITH BIOPSY, LASER ABLATION;  Surgeon: Romie Levee, MD;  Location: [facility];  Service: General;  Laterality: N/A;  . Co2 laser application N/A 02/11/2013    Procedure: CO2 LASER APPLICATION;  Surgeon: Romie Levee, MD;  Location: [facility] [place];  Service: General;  Laterality: N/A;   Family History  Problem Relation Age of Onset  . Mental illness Neg Hx   . Hypertension Mother   . Diabetes Mother   . Stroke Mother     cerbral aneurysm  .  Hypertension Brother    History  Substance Use Topics  . Smoking status: Current Every Day Smoker -- 0.50 packs/day for 15 years    Types: Cigarettes  . Smokeless tobacco: Never Used     Comment: may try something to quit  . Alcohol Use: Yes     Comment: occasional    Review of Systems  Constitutional: Negative for fever, chills, diaphoresis and fatigue.  HENT: Negative for congestion, rhinorrhea and sneezing.   Eyes: Negative.   Respiratory: Negative for cough, chest tightness and shortness of breath.   Cardiovascular: Negative for chest pain and leg swelling.  Gastrointestinal: Positive for nausea, abdominal pain and constipation. Negative for vomiting, diarrhea and blood in stool.  Genitourinary: Negative for frequency, hematuria, flank pain and difficulty urinating.  Musculoskeletal: Negative for arthralgias and back pain.  Skin: Negative for rash.  Neurological: Negative for dizziness, speech difficulty, weakness, numbness and headaches.      Allergies  Bactrim; Cymbalta; Other; Zoloft; and Neurontin  Home Medications   Prior to Admission medications   Medication Sig Start Date End Date Taking? Authorizing Provider  darunavir-cobicistat (PREZCOBIX) 800-150 MG per tablet Take 1 tablet by mouth daily. 01/13/14   Judyann Munson, MD  dicyclomine (BENTYL) 20 MG tablet Take 1 tablet (20 mg total) by mouth 2 (two) times daily. 03/16/14   Rolan Bucco, MD  emtricitabine-tenofovir (TRUVADA) 200-300 MG  per tablet Take 1 tablet by mouth daily. 01/13/14   Judyann Munson, MD  HYDROcodone-acetaminophen (NORCO) 5-325 MG per tablet Take 1 tablet by mouth every 6 (six) hours as needed for moderate pain. 09/25/13   Garnetta Buddy, MD  lisinopril (PRINIVIL,ZESTRIL) 20 MG tablet TAKE 1 TABLET BY MOUTH DAILY    Ginnie Smart, MD  loratadine (CLARITIN) 10 MG tablet Take 10 mg by mouth daily.    Historical Provider, MD  metoprolol tartrate (LOPRESSOR) 25 MG tablet Take 1 tablet (25 mg  total) by mouth 2 (two) times daily. 12/25/13   Garnetta Buddy, MD  ondansetron (ZOFRAN ODT) 4 MG disintegrating tablet  ODT q4 hours prn nausea/vomit 03/16/14   Rolan Bucco, MD  paliperidone (INVEGA) 6 MG 24 hr tablet Take 6 mg by mouth daily.    Historical Provider, MD  varenicline (CHANTIX CONTINUING MONTH PAK) 1 MG tablet Take 1 tablet (1 mg total) by mouth 2 (two) times daily. 12/29/13   Randall Hiss, MD  varenicline (CHANTIX STARTING MONTH PAK) 0.5 MG X 11 & 1 MG X 42 tablet Take one 0.5 mg tablet by mouth once daily for 3 days, then increase to one 0.5 mg tablet twice daily for 4 days, then increase to one 1 mg tablet twice daily. 12/29/13   Randall Hiss, MD   BP 121/74  Pulse 55  Temp(Src) 98.1 F (36.7 C) (Oral)  Resp 18  Ht 6' (1.829 m)  Wt 200 lb (90.719 kg)  BMI 27.12 kg/m2  SpO2 100% Physical Exam  Constitutional: He is oriented to person, place, and time. He appears well-developed and well-nourished.  HENT:  Head: Normocephalic and atraumatic.  Eyes: Pupils are equal, round, and reactive to light.  Neck: Normal range of motion. Neck supple.  Cardiovascular: Normal rate, regular rhythm and normal heart sounds.   Pulmonary/Chest: Effort normal and breath sounds normal. No respiratory distress. He has no wheezes. He has no rales. He exhibits no tenderness.  Abdominal: Soft. Bowel sounds are normal. There is tenderness (moderate pain to the left midabdomen). There is no rebound and no guarding.  Genitourinary:  No discomfort to the inguinal or testicular areas  Musculoskeletal: Normal range of motion. He exhibits no edema.  Lymphadenopathy:    He has no cervical adenopathy.  Neurological: He is alert and oriented to person, place, and time.  Skin: Skin is warm and dry. No rash noted.  Psychiatric: He has a normal mood and affect.    ED Course  Procedures (including critical care time) Labs Review Results for orders placed during the hospital  encounter of 03/16/14  COMPREHENSIVE METABOLIC PANEL      Result Value Ref Range   Sodium 142  137 - 147 mEq/L   Potassium 3.9  3.7 - 5.3 mEq/L   Chloride 104  96 - 112 mEq/L   CO2 27  19 - 32 mEq/L   Glucose, Bld 92  70 - 99 mg/dL   BUN 19  6 - 23 mg/dL   Creatinine, Ser 1.61  0.50 - 1.35 mg/dL   Calcium 9.3  8.4 - 09.6 mg/dL   Total Protein 8.0  6.0 - 8.3 g/dL   Albumin 4.0  3.5 - 5.2 g/dL   AST 22  0 - 37 U/L   ALT 13  0 - 53 U/L   Alkaline Phosphatase 75  39 - 117 U/L   Total Bilirubin 0.3  0.3 - 1.2 mg/dL   GFR calc  non Af Amer 69 (*) >90 mL/min   GFR calc Af Amer 80 (*) >90 mL/min   Anion gap 11  5 - 15  LIPASE, BLOOD      Result Value Ref Range   Lipase 49  11 - 59 U/L  URINALYSIS, ROUTINE W REFLEX MICROSCOPIC      Result Value Ref Range   Color, Urine YELLOW  YELLOW   APPearance CLEAR  CLEAR   Specific Gravity, Urine 1.028  1.005 - 1.030   pH 5.5  5.0 - 8.0   Glucose, UA NEGATIVE  NEGATIVE mg/dL   Hgb urine dipstick NEGATIVE  NEGATIVE   Bilirubin Urine NEGATIVE  NEGATIVE   Ketones, ur NEGATIVE  NEGATIVE mg/dL   Protein, ur NEGATIVE  NEGATIVE mg/dL   Urobilinogen, UA 0.2  0.0 - 1.0 mg/dL   Nitrite NEGATIVE  NEGATIVE   Leukocytes, UA NEGATIVE  NEGATIVE  CBC WITH DIFFERENTIAL      Result Value Ref Range   WBC 8.4  4.0 - 10.5 K/uL   RBC 4.08 (*) 4.22 - 5.81 MIL/uL   Hemoglobin 12.7 (*) 13.0 - 17.0 g/dL   HCT 45.4 (*) 09.8 - 11.9 %   MCV 94.9  78.0 - 100.0 fL   MCH 31.1  26.0 - 34.0 pg   MCHC 32.8  30.0 - 36.0 g/dL   RDW 14.7  82.9 - 56.2 %   Platelets 135 (*) 150 - 400 K/uL   Neutrophils Relative % 50  43 - 77 %   Neutro Abs 4.2  1.7 - 7.7 K/uL   Lymphocytes Relative 41  12 - 46 %   Lymphs Abs 3.4  0.7 - 4.0 K/uL   Monocytes Relative 8  3 - 12 %   Monocytes Absolute 0.7  0.1 - 1.0 K/uL   Eosinophils Relative 1  0 - 5 %   Eosinophils Absolute 0.1  0.0 - 0.7 K/uL   Basophils Relative 0  0 - 1 %   Basophils Absolute 0.0  0.0 - 0.1 K/uL   Ct Abdomen Pelvis W  Contrast  03/16/2014   CLINICAL DATA:  Left-sided abdominal pain  EXAM: CT ABDOMEN AND PELVIS WITH CONTRAST  TECHNIQUE: Multidetector CT imaging of the abdomen and pelvis was performed using the standard protocol following bolus administration of intravenous contrast.  CONTRAST:  50mL OMNIPAQUE IOHEXOL 300 MG/ML SOLN, OMNIPAQUE IOHEXOL 300 MG/ML SOLN  COMPARISON:  None.  FINDINGS: Liver, gallbladder, spleen, pancreas, adrenal gland, and right kidney are within normal limits.  Tiny exophytic abnormality in the left kidney is indeterminate. It measures 7 mm on image 34.  Normal appendix.  Bladder and prostate are unremarkable.  No free-fluid.  No abnormal retroperitoneal adenopathy.  IMPRESSION: No acute intra-abdominal pathology.   Electronically Signed   By: Maryclare Bean M.D.   On: 03/16/2014 13:34   Dg Abd Acute W/chest  03/16/2014   CLINICAL DATA:  Abdominal pain  EXAM: ACUTE ABDOMEN SERIES (ABDOMEN 2 VIEW & CHEST 1 VIEW)  COMPARISON:  03/22/2012 -Chest x-ray  FINDINGS: There is no evidence of dilated bowel loops or free intraperitoneal air. No radiopaque calculi or other significant radiographic abnormality is seen. Heart size and mediastinal contours are within normal limits. Both lungs are clear.  IMPRESSION: Negative abdominal radiographs.  No acute cardiopulmonary disease.   Electronically Signed   By: Elige Ko   On: 03/16/2014 10:47      Imaging Review Ct Abdomen Pelvis W Contrast  03/16/2014   CLINICAL  DATA:  Left-sided abdominal pain  EXAM: CT ABDOMEN AND PELVIS WITH CONTRAST  TECHNIQUE: Multidetector CT imaging of the abdomen and pelvis was performed using the standard protocol following bolus administration of intravenous contrast.  CONTRAST:  50mL OMNIPAQUE IOHEXOL 300 MG/ML SOLN, OMNIPAQUE IOHEXOL 300 MG/ML SOLN  COMPARISON:  None.  FINDINGS: Liver, gallbladder, spleen, pancreas, adrenal gland, and right kidney are within normal limits.  Tiny exophytic abnormality in the left  kidney is indeterminate. It measures 7 mm on image 34.  Normal appendix.  Bladder and prostate are unremarkable.  No free-fluid.  No abnormal retroperitoneal adenopathy.  IMPRESSION: No acute intra-abdominal pathology.   Electronically Signed   By: Maryclare Bean M.D.   On: 03/16/2014 13:34   Dg Abd Acute W/chest  03/16/2014   CLINICAL DATA:  Abdominal pain  EXAM: ACUTE ABDOMEN SERIES (ABDOMEN 2 VIEW & CHEST 1 VIEW)  COMPARISON:  03/22/2012 -Chest x-ray  FINDINGS: There is no evidence of dilated bowel loops or free intraperitoneal air. No radiopaque calculi or other significant radiographic abnormality is seen. Heart size and mediastinal contours are within normal limits. Both lungs are clear.  IMPRESSION: Negative abdominal radiographs.  No acute cardiopulmonary disease.   Electronically Signed   By: Elige Ko   On: 03/16/2014 10:47     EKG Interpretation None      MDM   Final diagnoses:  Left lower quadrant pain    Patient is no acute intra-abdominal abnormalities on CT. There's a questionable abnormal area on his left kidney. He appears to be comfortable and is intermittently sleeping in the ED. He was discharged home in good condition. He was encouraged to followup with his doctor at  the family practice center for ongoing followup on his kidney abnormality. He's advised to return here if his symptoms worsen.  I also discussed increasing the fiber in his diet and increasing water intake.  If that doesn't help his constipation, recommended Miralax.    Rolan Bucco, MD 03/16/14 1345  Rolan Bucco, MD 03/16/14 1351

## 2014-03-16 NOTE — ED Notes (Addendum)
Pt states he was at work lifting boxes and had the feeling of needing to vomit then the pain began in lower abdomen.

## 2014-04-06 ENCOUNTER — Ambulatory Visit (INDEPENDENT_AMBULATORY_CARE_PROVIDER_SITE_OTHER): Payer: No Typology Code available for payment source | Admitting: Family Medicine

## 2014-04-06 ENCOUNTER — Encounter: Payer: Self-pay | Admitting: Family Medicine

## 2014-04-06 VITALS — BP 128/78 | HR 118 | Temp 98.3°F | Ht 72.0 in | Wt 196.0 lb

## 2014-04-06 DIAGNOSIS — S46811A Strain of other muscles, fascia and tendons at shoulder and upper arm level, right arm, initial encounter: Secondary | ICD-10-CM

## 2014-04-06 DIAGNOSIS — Z23 Encounter for immunization: Secondary | ICD-10-CM

## 2014-04-06 DIAGNOSIS — Q639 Congenital malformation of kidney, unspecified: Secondary | ICD-10-CM

## 2014-04-06 DIAGNOSIS — F17200 Nicotine dependence, unspecified, uncomplicated: Secondary | ICD-10-CM

## 2014-04-06 NOTE — Progress Notes (Signed)
   Subjective:    Patient ID: Phillip Gill, male    DOB: June 16, 1976, 38 y.o.   MRN: [id number]  HPI Phillip Gill is here for ED f/u and tobacco abuse.   [facility]. He was ruled out for any abdominal pathology. Found to have tiny exophytic abnormality in the left kidney that was indeterminate.  He denies any family history of renal cancer. He has intended weight loss. No changes in voiding and no flank pain.   Tobacco abuse: currently smoking 1/2 PPD. He has tried chantix without improvement. He wants to quit. He hasn't tried patch or gum.   Reports having right sided trapezius pain. This started a couple of days ago. He noticed it in the morning when he woke up from sleep. He denies any tingling numbness or weakness in his right arm/hand. Has full range of motion of his arms with no provocation of his pain. He denies any injury. Denies any previous trauma or injury.   Current Outpatient Prescriptions on File Prior to Visit  Medication Sig Dispense Refill  . darunavir-cobicistat (PREZCOBIX) 800-150 MG per tablet Take 1 tablet by mouth daily.  30 tablet  11  . emtricitabine-tenofovir (TRUVADA) 200-300 MG per tablet Take 1 tablet by mouth daily.  30 tablet  11  . lisinopril (PRINIVIL,ZESTRIL) 20 MG tablet TAKE 1 TABLET BY MOUTH DAILY  30 tablet  5  . loratadine (CLARITIN) 10 MG tablet Take 10 mg by mouth daily.      . metoprolol tartrate (LOPRESSOR) 25 MG tablet Take 1 tablet (25 mg total) by mouth 2 (two) times daily.  180 tablet  3  . paliperidone (INVEGA) 6 MG 24 hr tablet Take 6 mg by mouth daily.      Marland Kitchen. HYDROcodone-acetaminophen (NORCO) 5-325 MG per tablet Take 1 tablet by mouth every 6 (six) hours as needed for moderate pain.  30 tablet  0  . ondansetron (ZOFRAN ODT) 4 MG disintegrating tablet 4mg  ODT q4 hours prn nausea/vomit  6 tablet  0  . [DISCONTINUED] gabapentin (NEURONTIN) 300 MG capsule Take 1 capsule (300 mg total) by mouth  3 (three) times daily.  90 capsule  3   No current facility-administered medications on file prior to visit.    SHx: current 1/2 PPD smoker. Willing to quit   Health Maintenance: received flu vaccine  Review of Systems See HPI     Objective:   Physical Exam BP 128/78  Pulse 118  Temp(Src) 98.3 F (36.8 C) (Oral)  Ht 6' (1.829 m)  Wt 196 lb (88.905 kg)  BMI 26.58 kg/m2 Gen: NAD, alert, cooperative with exam, well-appearing HEENT: NCAT, poor dentition  Abd: SNTND, BS present, no guarding or organomegaly Skin: no rashes, normal turgor  Neuro: no gross deficits.  MSK: neck: pain exacerbated while testing resistance of sternocleidomastoid muscle b/l. Tenderness to palpation over right trapezius. Shoulder: b/l FROM, no weakness, no overlying deformity      Assessment & Plan:

## 2014-04-06 NOTE — Patient Instructions (Signed)
Thank you for coming in,   Please try to do the exercises no the sheet. You can use tylenol or ibuprofen for pain.   I try to check to see what is covered in order to help you stop smoking.   Please follow up with me if your symptoms are not better in 6 weeks.    Please feel free to call with any questions or concerns at any time, at [phone]. --Dr. Jordan LikesSchmitz

## 2014-04-07 DIAGNOSIS — S46811A Strain of other muscles, fascia and tendons at shoulder and upper arm level, right arm, initial encounter: Secondary | ICD-10-CM | POA: Insufficient documentation

## 2014-04-07 DIAGNOSIS — Q639 Congenital malformation of kidney, unspecified: Secondary | ICD-10-CM | POA: Insufficient documentation

## 2014-04-07 NOTE — Assessment & Plan Note (Signed)
Found incidentally on CT. Patient with no risk factors of suggesting cancer. Discussed with radiologist that performed the read. Can treat as benign with no risk factors.  - if causing any problem in the future, could consider an abdominal U/S

## 2014-04-07 NOTE — Assessment & Plan Note (Signed)
Willing to quit. Has tried chantix. Wants to make sure that he can afford any treatment recommended. Patient needs to establish a quit date prior to starting another medication.  - can consider nortriptyline since it is on $4 list.   - Oral: Initial: 25 mg/day; titrate dose to 75-100 mg/day 10-28 days prior to selected "quit" date; continue therapy for ?12 weeks after "quit" day  - if no improvement with individual therapy, can consider with combination therapy (nortriptyline and Wellbutrin)

## 2014-04-07 NOTE — Assessment & Plan Note (Signed)
Most likely a strain of his muscle related to his sleeping pattern. No deficits on exam. TTP and with movement of head laterally on either side.  - given home modalities.  - ibuprofen for pain  - heat or ice for symptom improvement  - f/u in 4 weeks if no improvement.

## 2014-04-14 ENCOUNTER — Other Ambulatory Visit: Payer: No Typology Code available for payment source

## 2014-04-14 ENCOUNTER — Ambulatory Visit: Payer: No Typology Code available for payment source

## 2014-04-15 ENCOUNTER — Ambulatory Visit (INDEPENDENT_AMBULATORY_CARE_PROVIDER_SITE_OTHER): Payer: No Typology Code available for payment source | Admitting: Infectious Diseases

## 2014-04-15 ENCOUNTER — Encounter: Payer: Self-pay | Admitting: Infectious Diseases

## 2014-04-15 VITALS — BP 141/91 | HR 74 | Temp 98.4°F | Wt 196.0 lb

## 2014-04-15 DIAGNOSIS — Z79899 Other long term (current) drug therapy: Secondary | ICD-10-CM

## 2014-04-15 DIAGNOSIS — A630 Anogenital (venereal) warts: Secondary | ICD-10-CM

## 2014-04-15 DIAGNOSIS — B20 Human immunodeficiency virus [HIV] disease: Secondary | ICD-10-CM

## 2014-04-15 DIAGNOSIS — Z113 Encounter for screening for infections with a predominantly sexual mode of transmission: Secondary | ICD-10-CM

## 2014-04-15 LAB — CBC WITH DIFFERENTIAL/PLATELET
BASOS PCT: 0 % (ref 0–1)
Basophils Absolute: 0 10*3/uL (ref 0.0–0.1)
EOS ABS: 0.1 10*3/uL (ref 0.0–0.7)
Eosinophils Relative: 1 % (ref 0–5)
HEMATOCRIT: 37.5 % — AB (ref 39.0–52.0)
HEMOGLOBIN: 12.9 g/dL — AB (ref 13.0–17.0)
Lymphocytes Relative: 40 % (ref 12–46)
Lymphs Abs: 3 10*3/uL (ref 0.7–4.0)
MCH: 30.1 pg (ref 26.0–34.0)
MCHC: 34.4 g/dL (ref 30.0–36.0)
MCV: 87.4 fL (ref 78.0–100.0)
MONO ABS: 0.5 10*3/uL (ref 0.1–1.0)
Monocytes Relative: 6 % (ref 3–12)
NEUTROS ABS: 4 10*3/uL (ref 1.7–7.7)
Neutrophils Relative %: 53 % (ref 43–77)
Platelets: 157 10*3/uL (ref 150–400)
RBC: 4.29 MIL/uL (ref 4.22–5.81)
RDW: 13.7 % (ref 11.5–15.5)
WBC: 7.6 10*3/uL (ref 4.0–10.5)

## 2014-04-15 LAB — COMPLETE METABOLIC PANEL WITH GFR
ALK PHOS: 55 U/L (ref 39–117)
ALT: 9 U/L (ref 0–53)
AST: 11 U/L (ref 0–37)
Albumin: 3.9 g/dL (ref 3.5–5.2)
BUN: 15 mg/dL (ref 6–23)
CALCIUM: 9.3 mg/dL (ref 8.4–10.5)
CO2: 29 mEq/L (ref 19–32)
Chloride: 104 mEq/L (ref 96–112)
Creat: 1.22 mg/dL (ref 0.50–1.35)
GFR, Est African American: 87 mL/min
GFR, Est Non African American: 75 mL/min
GLUCOSE: 99 mg/dL (ref 70–99)
POTASSIUM: 3.8 meq/L (ref 3.5–5.3)
Sodium: 140 mEq/L (ref 135–145)
Total Bilirubin: 0.4 mg/dL (ref 0.2–1.2)
Total Protein: 6.9 g/dL (ref 6.0–8.3)

## 2014-04-15 LAB — LIPID PANEL
CHOL/HDL RATIO: 3.8 ratio
CHOLESTEROL: 127 mg/dL (ref 0–200)
HDL: 33 mg/dL — ABNORMAL LOW (ref >39)
LDL Cholesterol: 66 mg/dL (ref 0–99)
Triglycerides: 139 mg/dL (ref <150)
VLDL: 28 mg/dL (ref 0–40)

## 2014-04-15 MED ORDER — IMIQUIMOD 5 % EX CREA: TOPICAL_CREAM | CUTANEOUS | Status: DC

## 2014-04-15 NOTE — Assessment & Plan Note (Signed)
Will try to get him back into [facility]. Will give him rx for aldara. If he has too much skin irritation, can hold for a day.

## 2014-04-15 NOTE — Assessment & Plan Note (Signed)
He is doing well. His Cr is mildly elevated. Will continue to watch this. He has gotten flu shot. Will see him back as regularly scheuled. He is given condoms.

## 2014-04-15 NOTE — Addendum Note (Signed)
Addended bySteva Colder: Jaxn Chiquito on: 04/15/2014 04:25 PM   Modules accepted: Orders

## 2014-04-15 NOTE — Progress Notes (Signed)
   Subjective:    Patient ID: Phillip Gill, male    DOB: 06/09/1976, 38 y.o.   MRN: [id number]  HPI 38 yo M with hx of HIV+, has been on DRVc/TRV (has been taking well). As well has been on ACE-I as well for HTN. At lat visit was noted to have increase in Cr from baseline. Lat eval showed Cr 1.3 (03-2014). As well, he has had issues with warts on his bottom. Was noted again 1.5 weeks ago. Not in anal canal. Has had them before and had HRA and laser ablation (last f/u 03-2013 with Dr Maisie Fushomas).  Was told that he has to wait to first year (2016) as they are no longer taking orange card this year. Has used aldera previously, did not work very well.  Has had flu shot.   HIV 1 RNA Quant (copies/mL)  Date Value  12/07/2013 <20   08/10/2013 <20   03/26/2013 <20      CD4 T Cell Abs (/uL)  Date Value  12/07/2013 1110   08/10/2013 1140   03/26/2013 1010     Review of Systems  Constitutional: Negative for appetite change and unexpected weight change.  Gastrointestinal: Negative for diarrhea, constipation and blood in stool.  Genitourinary: Negative for difficulty urinating.       Objective:   Physical Exam  Constitutional: He appears well-developed and well-nourished.  HENT:  Mouth/Throat: No oropharyngeal exudate.  Eyes: EOM are normal. Pupils are equal, round, and reactive to light.  Neck: Neck supple.  Cardiovascular: Normal rate, regular rhythm and normal heart sounds.   Pulmonary/Chest: Effort normal and breath sounds normal.  Abdominal: Soft. Bowel sounds are normal.  Genitourinary:     Lymphadenopathy:    He has no cervical adenopathy.          Assessment & Plan:

## 2014-04-16 LAB — RPR

## 2014-04-16 LAB — T-HELPER CELL (CD4) - (RCID CLINIC ONLY)
CD4 T CELL HELPER: 32 % — AB (ref 33–55)
CD4 T Cell Abs: 980 /uL (ref 400–2700)

## 2014-04-19 ENCOUNTER — Telehealth: Payer: Self-pay | Admitting: *Deleted

## 2014-04-19 LAB — HIV-1 RNA QUANT-NO REFLEX-BLD
HIV 1 RNA Quant: <20 copies/mL (ref <20)
HIV-1 RNA Quant, Log: <1.3 {Log} (ref <1.30)

## 2014-04-19 NOTE — Telephone Encounter (Signed)
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 Per Celine MansSonja at [facility] patient is unable to afford this at this time but will call to schedule if he is able. Otherwise will have to try and refer patient in January [facility].

## 2014-04-21 ENCOUNTER — Encounter: Payer: Self-pay | Admitting: Family Medicine

## 2014-04-21 ENCOUNTER — Ambulatory Visit: Payer: No Typology Code available for payment source

## 2014-04-28 ENCOUNTER — Ambulatory Visit: Payer: No Typology Code available for payment source | Admitting: Infectious Disease

## 2014-04-30 ENCOUNTER — Encounter: Payer: Self-pay | Admitting: Infectious Disease

## 2014-04-30 ENCOUNTER — Ambulatory Visit (INDEPENDENT_AMBULATORY_CARE_PROVIDER_SITE_OTHER): Payer: No Typology Code available for payment source | Admitting: Infectious Disease

## 2014-04-30 VITALS — BP 123/93 | HR 82 | Temp 99.1°F | Wt 193.0 lb

## 2014-04-30 DIAGNOSIS — B20 Human immunodeficiency virus [HIV] disease: Secondary | ICD-10-CM

## 2014-04-30 DIAGNOSIS — A630 Anogenital (venereal) warts: Secondary | ICD-10-CM

## 2014-04-30 DIAGNOSIS — I10 Essential (primary) hypertension: Secondary | ICD-10-CM

## 2014-04-30 DIAGNOSIS — N62 Hypertrophy of breast: Secondary | ICD-10-CM

## 2014-04-30 MED ORDER — NICOTINE 14 MG/24HR TD PT24: 14.0000 mg | MEDICATED_PATCH | TRANSDERMAL | Status: DC

## 2014-04-30 MED ORDER — NICOTINE 7 MG/24HR TD PT24: 7.0000 mg | MEDICATED_PATCH | TRANSDERMAL | Status: DC

## 2014-04-30 MED ORDER — NICOTINE 21 MG/24HR TD PT24: 21.0000 mg | MEDICATED_PATCH | TRANSDERMAL | Status: DC

## 2014-04-30 MED ORDER — IMIQUIMOD 5 % EX CREA: TOPICAL_CREAM | CUTANEOUS | Status: DC

## 2014-04-30 NOTE — Progress Notes (Signed)
  Subjective:    Patient ID: Phillip HalstedShanna M Hibberd, male    DOB: August 14, 1975, 38 y.o.   MRN: [id number]  HPI   38 year old [place] man with HIV nicely controlled with PREZCOBIX and Truvada He is here for followup.   Lab Results  Component Value Date   HIV1RNAQUANT <20 04/15/2014   Lab Results  Component Value Date   CD4TABS 980 04/15/2014   CD4TABS 1110 12/07/2013   CD4TABS 1140 08/10/2013    He is to get back in to see [facility] and has had aldara rx to him by Dr. Ninetta LightsHatcher though there appears to have been mix up with script not having gone through.   Review of Systems  Constitutional: Negative for fever, chills, diaphoresis, activity change, appetite change, fatigue and unexpected weight change.  HENT: Negative for congestion, rhinorrhea, sinus pressure, sneezing, sore throat and trouble swallowing.   Eyes: Negative for photophobia and visual disturbance.  Respiratory: Negative for cough, chest tightness, shortness of breath, wheezing and stridor.   Cardiovascular: Negative for chest pain, palpitations and leg swelling.  Gastrointestinal: Negative for nausea, vomiting, abdominal pain, diarrhea, constipation, blood in stool, abdominal distention and anal bleeding.  Genitourinary: Negative for dysuria, hematuria, flank pain and difficulty urinating.  Musculoskeletal: Negative for arthralgias, back pain, gait problem, joint swelling and myalgias.  Skin: Negative for color change, pallor, rash and wound.  Neurological: Negative for dizziness, tremors, weakness and light-headedness.  Hematological: Negative for adenopathy. Does not bruise/bleed easily.  Psychiatric/Behavioral: Negative for behavioral problems, confusion, sleep disturbance, dysphoric mood, decreased concentration and agitation.       Objective:   Physical Exam  Constitutional: He is oriented to person, place, and time. He appears well-developed and well-nourished. No distress.  HENT:  Head: Normocephalic and  atraumatic.  Mouth/Throat: Oropharynx is clear and moist. No oropharyngeal exudate.  Eyes: Conjunctivae and EOM are normal. Pupils are equal, round, and reactive to light. No scleral icterus.  Neck: Normal range of motion. Neck supple. No JVD present.  Cardiovascular: Normal rate, regular rhythm and normal heart sounds.  Exam reveals no gallop and no friction rub.   No murmur heard. Pulmonary/Chest: Effort normal and breath sounds normal. No respiratory distress. He has no wheezes. He has no rales. He exhibits no tenderness.  Abdominal: He exhibits no distension and no mass. There is no tenderness. There is no rebound and no guarding.  Musculoskeletal: He exhibits no edema and no tenderness.  Lymphadenopathy:    He has no cervical adenopathy.  Neurological: He is alert and oriented to person, place, and time. He has normal reflexes. He exhibits normal muscle tone. Coordination normal.  Skin: Skin is warm and dry. He is not diaphoretic. No erythema. No pallor.  Psychiatric: He has a normal mood and affect. His behavior is normal. Judgment and thought content normal.          Assessment & Plan:   HIV: Continue prezcobix and truvada,    BP: Is on a beta blocker and ACE, ambulatory bp and report back to PCP and to me  Genital warts: wishes for referral back to [facility] (he missed appt with them), try to facilitate this again but is missing appointments certainly not helping things   Smoker: spent > 3 minutes counselling to quit Chantix did not work but he is going to try nicotine patches

## 2014-05-26 ENCOUNTER — Ambulatory Visit: Payer: No Typology Code available for payment source | Admitting: Family Medicine

## 2014-06-01 ENCOUNTER — Other Ambulatory Visit: Payer: Self-pay | Admitting: *Deleted

## 2014-06-01 DIAGNOSIS — B20 Human immunodeficiency virus [HIV] disease: Secondary | ICD-10-CM

## 2014-06-01 MED ORDER — EMTRICITABINE-TENOFOVIR DF 200-300 MG PO TABS: 1.0000 | ORAL_TABLET | Freq: Every day | ORAL | Status: DC

## 2014-06-01 MED ORDER — DARUNAVIR-COBICISTAT 800-150 MG PO TABS: 1.0000 | ORAL_TABLET | Freq: Every day | ORAL | Status: DC

## 2014-06-08 ENCOUNTER — Other Ambulatory Visit: Payer: Self-pay | Admitting: Infectious Diseases

## 2014-07-09 ENCOUNTER — Other Ambulatory Visit: Payer: Self-pay | Admitting: Infectious Disease

## 2014-07-12 ENCOUNTER — Telehealth: Payer: Self-pay | Admitting: *Deleted

## 2014-07-12 NOTE — Telephone Encounter (Signed)
Chantix rx not active on the pt's medication list.  Chantix is the only rx covered medication through [employer] to STOP smoking.  RN called the  QUIT NOW 800#.  Pt does qualify to obtain Nicotine Patches from the [place].  Left pt message on his phone on how to [facility] and obtain [facility] patches.

## 2014-07-12 NOTE — Telephone Encounter (Signed)
Thanks Denise! 

## 2014-07-19 ENCOUNTER — Ambulatory Visit: Payer: No Typology Code available for payment source | Admitting: Infectious Disease

## 2014-08-16 ENCOUNTER — Other Ambulatory Visit: Payer: No Typology Code available for payment source

## 2014-08-16 ENCOUNTER — Ambulatory Visit: Payer: No Typology Code available for payment source

## 2014-08-30 ENCOUNTER — Ambulatory Visit (INDEPENDENT_AMBULATORY_CARE_PROVIDER_SITE_OTHER): Payer: No Typology Code available for payment source | Admitting: Infectious Disease

## 2014-08-30 ENCOUNTER — Encounter: Payer: Self-pay | Admitting: Infectious Disease

## 2014-08-30 DIAGNOSIS — B20 Human immunodeficiency virus [HIV] disease: Secondary | ICD-10-CM

## 2014-08-30 DIAGNOSIS — I10 Essential (primary) hypertension: Secondary | ICD-10-CM

## 2014-08-30 DIAGNOSIS — Z79899 Other long term (current) drug therapy: Secondary | ICD-10-CM

## 2014-08-30 DIAGNOSIS — Z113 Encounter for screening for infections with a predominantly sexual mode of transmission: Secondary | ICD-10-CM

## 2014-08-30 LAB — CBC WITH DIFFERENTIAL/PLATELET
BASOS ABS: 0 10*3/uL (ref 0.0–0.1)
BASOS PCT: 0 % (ref 0–1)
Eosinophils Absolute: 0.2 10*3/uL (ref 0.0–0.7)
Eosinophils Relative: 2 % (ref 0–5)
HCT: 45.7 % (ref 39.0–52.0)
HEMOGLOBIN: 15.3 g/dL (ref 13.0–17.0)
LYMPHS ABS: 2.2 10*3/uL (ref 0.7–4.0)
Lymphocytes Relative: 26 % (ref 12–46)
MCH: 30.5 pg (ref 26.0–34.0)
MCHC: 33.5 g/dL (ref 30.0–36.0)
MCV: 91.2 fL (ref 78.0–100.0)
MONO ABS: 0.9 10*3/uL (ref 0.1–1.0)
MONOS PCT: 10 % (ref 3–12)
MPV: 10.8 fL (ref 8.6–12.4)
Neutro Abs: 5.3 10*3/uL (ref 1.7–7.7)
Neutrophils Relative %: 62 % (ref 43–77)
Platelets: 190 10*3/uL (ref 150–400)
RBC: 5.01 MIL/uL (ref 4.22–5.81)
RDW: 14.3 % (ref 11.5–15.5)
WBC: 8.5 10*3/uL (ref 4.0–10.5)

## 2014-08-30 MED ORDER — METOPROLOL SUCCINATE ER 25 MG PO TB24: 25.0000 mg | ORAL_TABLET | Freq: Every day | ORAL | Status: DC

## 2014-08-30 NOTE — Progress Notes (Signed)
  Subjective:    Patient ID: Phillip Gill, male    DOB: 08/21/1975, 39 y.o.   MRN: [id number]  HPI   39 year old [place] man with HIV nicely controlled with PREZCOBIX and Truvada He is here for followup.   Lab Results  Component Value Date   HIV1RNAQUANT <20 04/15/2014   Lab Results  Component Value Date   CD4TABS 980 04/15/2014   CD4TABS 1110 12/07/2013   CD4TABS 1140 08/10/2013      Review of Systems  Constitutional: Negative for fever, chills, diaphoresis, activity change, appetite change, fatigue and unexpected weight change.  HENT: Negative for congestion, rhinorrhea, sinus pressure, sneezing, sore throat and trouble swallowing.   Eyes: Negative for photophobia and visual disturbance.  Respiratory: Negative for cough, chest tightness, shortness of breath, wheezing and stridor.   Cardiovascular: Negative for chest pain, palpitations and leg swelling.  Gastrointestinal: Negative for nausea, vomiting, abdominal pain, diarrhea, constipation, blood in stool, abdominal distention and anal bleeding.  Genitourinary: Negative for dysuria, hematuria, flank pain and difficulty urinating.  Musculoskeletal: Negative for myalgias, back pain, joint swelling, arthralgias and gait problem.  Skin: Negative for color change, pallor, rash and wound.  Neurological: Negative for dizziness, tremors, weakness and light-headedness.  Hematological: Negative for adenopathy. Does not bruise/bleed easily.  Psychiatric/Behavioral: Negative for behavioral problems, confusion, sleep disturbance, dysphoric mood, decreased concentration and agitation.       Objective:   Physical Exam  Constitutional: He is oriented to person, place, and time. He appears well-developed and well-nourished. No distress.  HENT:  Head: Normocephalic and atraumatic.  Mouth/Throat: Oropharynx is clear and moist. No oropharyngeal exudate.  Eyes: Conjunctivae and EOM are normal. Pupils are equal, round, and  reactive to light. No scleral icterus.  Neck: Normal range of motion. Neck supple. No JVD present.  Cardiovascular: Normal rate, regular rhythm and normal heart sounds.  Exam reveals no gallop and no friction rub.   No murmur heard. Pulmonary/Chest: Effort normal and breath sounds normal. No respiratory distress. He has no wheezes. He has no rales. He exhibits no tenderness.  Abdominal: He exhibits no distension and no mass. There is no tenderness. There is no rebound and no guarding.  Musculoskeletal: He exhibits no edema or tenderness.  Lymphadenopathy:    He has no cervical adenopathy.  Neurological: He is alert and oriented to person, place, and time. He has normal reflexes. He exhibits normal muscle tone. Coordination normal.  Skin: Skin is warm and dry. He is not diaphoretic. No erythema. No pallor.  Psychiatric: He has a normal mood and affect. His behavior is normal. Judgment and thought content normal.          Assessment & Plan:   HIV: Continue prezcobix and truvada check labs today  BP: Is on a beta blocker and ACE, ambulatory bp and report back to PCP and to me

## 2014-08-31 ENCOUNTER — Telehealth: Payer: Self-pay | Admitting: Licensed Clinical Social Worker

## 2014-08-31 ENCOUNTER — Other Ambulatory Visit: Payer: Self-pay | Admitting: Licensed Clinical Social Worker

## 2014-08-31 DIAGNOSIS — I10 Essential (primary) hypertension: Secondary | ICD-10-CM

## 2014-08-31 LAB — COMPLETE METABOLIC PANEL WITH GFR
ALT: 13 U/L (ref 0–53)
AST: 18 U/L (ref 0–37)
Albumin: 3.9 g/dL (ref 3.5–5.2)
Alkaline Phosphatase: 67 U/L (ref 39–117)
BILIRUBIN TOTAL: 0.4 mg/dL (ref 0.2–1.2)
BUN: 13 mg/dL (ref 6–23)
CALCIUM: 8.9 mg/dL (ref 8.4–10.5)
CO2: 27 mEq/L (ref 19–32)
CREATININE: 1.5 mg/dL — AB (ref 0.50–1.35)
Chloride: 103 mEq/L (ref 96–112)
GFR, Est African American: 67 mL/min
GFR, Est Non African American: 58 mL/min — ABNORMAL LOW
Glucose, Bld: 73 mg/dL (ref 70–99)
Potassium: 3.9 mEq/L (ref 3.5–5.3)
Sodium: 140 mEq/L (ref 135–145)
Total Protein: 6.8 g/dL (ref 6.0–8.3)

## 2014-08-31 LAB — LIPID PANEL
CHOL/HDL RATIO: 3.8 ratio
CHOLESTEROL: 129 mg/dL (ref 0–200)
HDL: 34 mg/dL — ABNORMAL LOW (ref >=40)
LDL Cholesterol: 70 mg/dL (ref 0–99)
Triglycerides: 126 mg/dL (ref <150)
VLDL: 25 mg/dL (ref 0–40)

## 2014-08-31 LAB — URINE CYTOLOGY ANCILLARY ONLY
Chlamydia: NEGATIVE
NEISSERIA GONORRHEA: NEGATIVE

## 2014-08-31 LAB — T-HELPER CELL (CD4) - (RCID CLINIC ONLY)
CD4 % Helper T Cell: 35 % (ref 33–55)
CD4 T CELL ABS: 850 /uL (ref 400–2700)

## 2014-08-31 LAB — RPR

## 2014-08-31 NOTE — Telephone Encounter (Signed)
-----   Message from Randall Hissornelius N Van Dam, MD sent at 08/31/2014  1:47 PM EST ----- Please ask him to come to clinic for a nursing visit next week for blood pressure check and to bring his medications with him. He also needs a check of his BMP repeated since his serum creatinine has gone up to 1.5

## 2014-08-31 NOTE — Telephone Encounter (Signed)
I called phone number listed, someone answered but unable to hear them. Patient needs to schedule bp check and bring blood medications per Dr. Daiva EvesVan Dam. Also needs BMP drawn same day.

## 2014-09-01 LAB — HIV-1 RNA QUANT-NO REFLEX-BLD
HIV 1 RNA QUANT: 43 {copies}/mL — AB (ref <20)
HIV-1 RNA QUANT, LOG: 1.63 {Log} — AB (ref <1.30)

## 2014-09-06 ENCOUNTER — Other Ambulatory Visit: Payer: Self-pay | Admitting: *Deleted

## 2014-09-06 DIAGNOSIS — B20 Human immunodeficiency virus [HIV] disease: Secondary | ICD-10-CM

## 2014-09-06 MED ORDER — DARUNAVIR-COBICISTAT 800-150 MG PO TABS: 1.0000 | ORAL_TABLET | Freq: Every day | ORAL | Status: DC

## 2014-09-06 MED ORDER — EMTRICITABINE-TENOFOVIR DF 200-300 MG PO TABS: 1.0000 | ORAL_TABLET | Freq: Every day | ORAL | Status: DC

## 2014-09-06 NOTE — Telephone Encounter (Signed)
ADAP Application 

## 2014-09-11 ENCOUNTER — Other Ambulatory Visit: Payer: Self-pay | Admitting: Infectious Disease

## 2014-10-02 ENCOUNTER — Inpatient Hospital Stay (HOSPITAL_COMMUNITY)
Admission: EM | Admit: 2014-10-02 | Discharge: 2014-10-07 | DRG: 727 | Disposition: A | Discharge status: Home / Self Care | Payer: No Typology Code available for payment source | Attending: Family Medicine | Admitting: Family Medicine

## 2014-10-02 ENCOUNTER — Emergency Department (HOSPITAL_COMMUNITY): Payer: No Typology Code available for payment source

## 2014-10-02 ENCOUNTER — Encounter (HOSPITAL_COMMUNITY): Payer: Self-pay | Admitting: *Deleted

## 2014-10-02 DIAGNOSIS — A512 Primary syphilis of other sites: Secondary | ICD-10-CM | POA: Diagnosis present

## 2014-10-02 DIAGNOSIS — E860 Dehydration: Secondary | ICD-10-CM | POA: Diagnosis present

## 2014-10-02 DIAGNOSIS — D696 Thrombocytopenia, unspecified: Secondary | ICD-10-CM | POA: Insufficient documentation

## 2014-10-02 DIAGNOSIS — A510 Primary genital syphilis: Secondary | ICD-10-CM | POA: Insufficient documentation

## 2014-10-02 DIAGNOSIS — B349 Viral infection, unspecified: Secondary | ICD-10-CM

## 2014-10-02 DIAGNOSIS — A55 Chlamydial lymphogranuloma (venereum): Principal | ICD-10-CM | POA: Insufficient documentation

## 2014-10-02 DIAGNOSIS — N289 Disorder of kidney and ureter, unspecified: Secondary | ICD-10-CM

## 2014-10-02 DIAGNOSIS — R197 Diarrhea, unspecified: Secondary | ICD-10-CM | POA: Diagnosis present

## 2014-10-02 DIAGNOSIS — B20 Human immunodeficiency virus [HIV] disease: Secondary | ICD-10-CM | POA: Diagnosis present

## 2014-10-02 DIAGNOSIS — E876 Hypokalemia: Secondary | ICD-10-CM | POA: Diagnosis present

## 2014-10-02 DIAGNOSIS — N485 Ulcer of penis: Secondary | ICD-10-CM | POA: Diagnosis present

## 2014-10-02 DIAGNOSIS — D72829 Elevated white blood cell count, unspecified: Secondary | ICD-10-CM | POA: Insufficient documentation

## 2014-10-02 DIAGNOSIS — R509 Fever, unspecified: Secondary | ICD-10-CM | POA: Diagnosis present

## 2014-10-02 DIAGNOSIS — M797 Fibromyalgia: Secondary | ICD-10-CM | POA: Diagnosis present

## 2014-10-02 DIAGNOSIS — L03114 Cellulitis of left upper limb: Secondary | ICD-10-CM | POA: Insufficient documentation

## 2014-10-02 DIAGNOSIS — O864 Pyrexia of unknown origin following delivery: Secondary | ICD-10-CM

## 2014-10-02 DIAGNOSIS — T148XXA Other injury of unspecified body region, initial encounter: Secondary | ICD-10-CM | POA: Insufficient documentation

## 2014-10-02 DIAGNOSIS — I10 Essential (primary) hypertension: Secondary | ICD-10-CM | POA: Diagnosis present

## 2014-10-02 DIAGNOSIS — G43909 Migraine, unspecified, not intractable, without status migrainosus: Secondary | ICD-10-CM | POA: Diagnosis present

## 2014-10-02 DIAGNOSIS — F17210 Nicotine dependence, cigarettes, uncomplicated: Secondary | ICD-10-CM | POA: Diagnosis present

## 2014-10-02 DIAGNOSIS — M7989 Other specified soft tissue disorders: Secondary | ICD-10-CM

## 2014-10-02 DIAGNOSIS — N170 Acute kidney failure with tubular necrosis: Secondary | ICD-10-CM | POA: Diagnosis present

## 2014-10-02 DIAGNOSIS — Z21 Asymptomatic human immunodeficiency virus [HIV] infection status: Secondary | ICD-10-CM | POA: Diagnosis present

## 2014-10-02 DIAGNOSIS — R590 Localized enlarged lymph nodes: Secondary | ICD-10-CM | POA: Diagnosis present

## 2014-10-02 DIAGNOSIS — R739 Hyperglycemia, unspecified: Secondary | ICD-10-CM | POA: Diagnosis present

## 2014-10-02 DIAGNOSIS — M1990 Unspecified osteoarthritis, unspecified site: Secondary | ICD-10-CM | POA: Diagnosis present

## 2014-10-02 DIAGNOSIS — B181 Chronic viral hepatitis B without delta-agent: Secondary | ICD-10-CM | POA: Diagnosis present

## 2014-10-02 DIAGNOSIS — K589 Irritable bowel syndrome without diarrhea: Secondary | ICD-10-CM | POA: Diagnosis present

## 2014-10-02 HISTORY — DX: Disorder of kidney and ureter, unspecified: N28.9

## 2014-10-02 LAB — URINALYSIS, ROUTINE W REFLEX MICROSCOPIC
GLUCOSE, UA: NEGATIVE mg/dL
Ketones, ur: 15 mg/dL — AB
Nitrite: NEGATIVE
PROTEIN: 30 mg/dL — AB
SPECIFIC GRAVITY, URINE: 1.015 (ref 1.005–1.030)
Urobilinogen, UA: 1 mg/dL (ref 0.0–1.0)
pH: 5 (ref 5.0–8.0)

## 2014-10-02 LAB — CBC WITH DIFFERENTIAL/PLATELET
Basophils Absolute: 0 10*3/uL (ref 0.0–0.1)
Basophils Relative: 0 % (ref 0–1)
EOS ABS: 0 10*3/uL (ref 0.0–0.7)
Eosinophils Relative: 0 % (ref 0–5)
HCT: 41.9 % (ref 39.0–52.0)
Hemoglobin: 13.7 g/dL (ref 13.0–17.0)
Lymphocytes Relative: 11 % — ABNORMAL LOW (ref 12–46)
Lymphs Abs: 1.3 10*3/uL (ref 0.7–4.0)
MCH: 29.7 pg (ref 26.0–34.0)
MCHC: 32.7 g/dL (ref 30.0–36.0)
MCV: 90.9 fL (ref 78.0–100.0)
MONO ABS: 0.4 10*3/uL (ref 0.1–1.0)
Monocytes Relative: 4 % (ref 3–12)
Neutro Abs: 10.3 10*3/uL — ABNORMAL HIGH (ref 1.7–7.7)
Neutrophils Relative %: 85 % — ABNORMAL HIGH (ref 43–77)
PLATELETS: 149 10*3/uL — AB (ref 150–400)
RBC: 4.61 MIL/uL (ref 4.22–5.81)
RDW: 13.2 % (ref 11.5–15.5)
WBC: 12 10*3/uL — AB (ref 4.0–10.5)

## 2014-10-02 LAB — URINE MICROSCOPIC-ADD ON

## 2014-10-02 LAB — COMPREHENSIVE METABOLIC PANEL
ALT: 11 U/L (ref 0–53)
AST: 31 U/L (ref 0–37)
Albumin: 3.4 g/dL — ABNORMAL LOW (ref 3.5–5.2)
Alkaline Phosphatase: 77 U/L (ref 39–117)
Anion gap: 8 (ref 5–15)
BILIRUBIN TOTAL: 0.4 mg/dL (ref 0.3–1.2)
BUN: 11 mg/dL (ref 6–23)
CO2: 24 mmol/L (ref 19–32)
CREATININE: 2.2 mg/dL — AB (ref 0.50–1.35)
Calcium: 8.7 mg/dL (ref 8.4–10.5)
Chloride: 102 mmol/L (ref 96–112)
GFR calc Af Amer: 42 mL/min — ABNORMAL LOW (ref >90)
GFR, EST NON AFRICAN AMERICAN: 36 mL/min — AB (ref >90)
Glucose, Bld: 223 mg/dL — ABNORMAL HIGH (ref 70–99)
POTASSIUM: 3.1 mmol/L — AB (ref 3.5–5.1)
Sodium: 134 mmol/L — ABNORMAL LOW (ref 135–145)
Total Protein: 7.7 g/dL (ref 6.0–8.3)

## 2014-10-02 LAB — I-STAT CG4 LACTIC ACID, ED
LACTIC ACID, VENOUS: 3.98 mmol/L — AB (ref 0.5–2.0)
Lactic Acid, Venous: 0.52 mmol/L (ref 0.5–2.0)

## 2014-10-02 LAB — INFLUENZA PANEL BY PCR (TYPE A & B)
H1N1FLUPCR: NOT DETECTED
Influenza A By PCR: NEGATIVE
Influenza B By PCR: NEGATIVE

## 2014-10-02 LAB — CREATININE, URINE, RANDOM: Creatinine, Urine: 311.48 mg/dL

## 2014-10-02 LAB — SODIUM, URINE, RANDOM: Sodium, Ur: 10 mmol/L

## 2014-10-02 LAB — OSMOLALITY, URINE: Osmolality, Ur: 248 mOsm/kg — ABNORMAL LOW (ref 390–1090)

## 2014-10-02 MED ORDER — DARUNAVIR-COBICISTAT 800-150 MG PO TABS
1.0000 | ORAL_TABLET | Freq: Every day | ORAL | Status: DC
  Administered 2014-10-02 – 2014-10-03 (×2): 1 via ORAL
  Filled 2014-10-02 (×2): qty 1

## 2014-10-02 MED ORDER — HEPARIN SODIUM (PORCINE) 5000 UNIT/ML IJ SOLN
5000.0000 [IU] | Freq: Three times a day (TID) | INTRAMUSCULAR | Status: DC
  Filled 2014-10-02 (×16): qty 1

## 2014-10-02 MED ORDER — DOXYCYCLINE HYCLATE 100 MG PO TABS
100.0000 mg | ORAL_TABLET | Freq: Two times a day (BID) | ORAL | Status: DC
  Administered 2014-10-02: 100 mg via ORAL
  Filled 2014-10-02 (×2): qty 1

## 2014-10-02 MED ORDER — IBUPROFEN 400 MG PO TABS
400.0000 mg | ORAL_TABLET | Freq: Once | ORAL | Status: AC
  Administered 2014-10-02: 400 mg via ORAL
  Filled 2014-10-02: qty 1
  Filled 2014-10-02: qty 2
  Filled 2014-10-02: qty 1

## 2014-10-02 MED ORDER — LISINOPRIL 20 MG PO TABS
20.0000 mg | ORAL_TABLET | Freq: Every day | ORAL | Status: DC
  Filled 2014-10-02: qty 1

## 2014-10-02 MED ORDER — ONDANSETRON HCL 4 MG/2ML IJ SOLN: 4.0000 mg | Freq: Four times a day (QID) | INTRAMUSCULAR | Status: DC | PRN

## 2014-10-02 MED ORDER — ONDANSETRON HCL 4 MG PO TABS: 4.0000 mg | ORAL_TABLET | Freq: Four times a day (QID) | ORAL | Status: DC | PRN

## 2014-10-02 MED ORDER — EMTRICITABINE-TENOFOVIR DF 200-300 MG PO TABS
1.0000 | ORAL_TABLET | Freq: Every day | ORAL | Status: DC
  Administered 2014-10-02 – 2014-10-03 (×2): 1 via ORAL
  Filled 2014-10-02 (×2): qty 1

## 2014-10-02 MED ORDER — SENNOSIDES-DOCUSATE SODIUM 8.6-50 MG PO TABS: 1.0000 | ORAL_TABLET | Freq: Every evening | ORAL | Status: DC | PRN

## 2014-10-02 MED ORDER — METOPROLOL SUCCINATE ER 25 MG PO TB24
25.0000 mg | ORAL_TABLET | Freq: Every day | ORAL | Status: DC
  Administered 2014-10-02 – 2014-10-07 (×6): 25 mg via ORAL
  Filled 2014-10-02 (×6): qty 1

## 2014-10-02 MED ORDER — POTASSIUM CHLORIDE CRYS ER 20 MEQ PO TBCR
40.0000 meq | EXTENDED_RELEASE_TABLET | Freq: Once | ORAL | Status: AC
  Administered 2014-10-02: 40 meq via ORAL
  Filled 2014-10-02: qty 2

## 2014-10-02 MED ORDER — PNEUMOCOCCAL VAC POLYVALENT 25 MCG/0.5ML IJ INJ
0.5000 mL | INJECTION | INTRAMUSCULAR | Status: AC
  Administered 2014-10-03: 0.5 mL via INTRAMUSCULAR
  Filled 2014-10-02: qty 0.5

## 2014-10-02 MED ORDER — ACETAMINOPHEN 325 MG PO TABS
650.0000 mg | ORAL_TABLET | Freq: Four times a day (QID) | ORAL | Status: DC | PRN
  Administered 2014-10-02 – 2014-10-04 (×5): 650 mg via ORAL
  Filled 2014-10-02 (×5): qty 2

## 2014-10-02 MED ORDER — SODIUM CHLORIDE 0.9 % IV BOLUS (SEPSIS)
500.0000 mL | Freq: Once | INTRAVENOUS | Status: AC
  Administered 2014-10-02: 500 mL via INTRAVENOUS

## 2014-10-02 MED ORDER — SODIUM CHLORIDE 0.9 % IV BOLUS (SEPSIS)
1000.0000 mL | Freq: Once | INTRAVENOUS | Status: AC
  Administered 2014-10-02: 1000 mL via INTRAVENOUS

## 2014-10-02 MED ORDER — SODIUM CHLORIDE 0.9 % IV SOLN
INTRAVENOUS | Status: DC
  Administered 2014-10-02 (×2): 1000 mL via INTRAVENOUS
  Administered 2014-10-04 – 2014-10-07 (×3): via INTRAVENOUS

## 2014-10-02 MED ORDER — DOXYCYCLINE HYCLATE 100 MG PO TABS
100.0000 mg | ORAL_TABLET | Freq: Two times a day (BID) | ORAL | Status: DC
  Administered 2014-10-02 – 2014-10-03 (×2): 100 mg via ORAL
  Filled 2014-10-02 (×3): qty 1

## 2014-10-02 MED ORDER — ACETAMINOPHEN 325 MG PO TABS
650.0000 mg | ORAL_TABLET | Freq: Once | ORAL | Status: AC
  Administered 2014-10-02: 650 mg via ORAL
  Filled 2014-10-02: qty 2

## 2014-10-02 MED ORDER — ACETAMINOPHEN 650 MG RE SUPP: 650.0000 mg | Freq: Four times a day (QID) | RECTAL | Status: DC | PRN

## 2014-10-02 NOTE — H&P (Signed)
Family Medicine Teaching [facility] Admission History and Physical Service Pager: [phone]  Patient name: Phillip Gill Medical record number: [id number] Date of birth: 1975/12/30 Age: 39 y.o. Gender: male  Primary Care Provider: Myra RudeSchmitz, Jeremy E, MD Consultants: none Code Status: Full  Chief Complaint: fever   Assessment and Plan: Phillip Gill is a 39 y.o. male presenting with fever. PMH is significant for HIV, HTN, anxiety, Hep b infection, tobacco dependence, and fibromyalgia.   #Fever: concern for flu vs syphilis. Ulcerated lesion on shaft of penis, inguinal LAD and MSM. Having some fevers. CD4 counts are good. No myalgias or arthralgias.  May need to consider Lymphogranuloma venereum, chancroid, HSV, or Behchet's. Also need to think about other sources of fever such as medications or malignancy. Doesn't appear septic at this point. Lactic acid concerning with elevated fever 103. CXR clear and UA showing trace Leukocytes.  Normal RR, normotensive and not tachycardic. In no distress on exam.  - admit to med surg, Dr. Leveda AnnaHensel attending  - Flu swab  - Urine cytology  - RPR  - HSV culture  - start doxycycline, other treatments will depend on results -  Blood cx pending  - urine cx pending   #AKI/ATN: Scr 2.2 with baseline 1.1-1.2. Reports normal PO intake. Does take lisinopril but this wasn't started acutely. UA showing granular casts  - fluids 75 mL/hr, bolus x 1 in ED  - encourage PO  - Urine Osm, UNa, Ucr   #Hypokalemia: not reporting any GI losses. May be 2/2 to his kidney insult  - Kdur 40 mEq x 1   #Hyperglycemia: no history of diabetes.  - check Hgb A1c    #HIV[place] Well controlled with good CD4 counts  - continue home medications.   #HTN: controlled.  - holding lisinopril in setting of AKI  - continue metoprolol   #Anxiety: hx of of and appears stable currently   FEN/GI: NS 75 mL/hr/ Heart healthy  Prophylaxis: hep  subq  Disposition: admitted to FPTS for fever with unknown source presently   History of Present Illness: Phillip Gill is a 39 y.o. male presenting with fever.   He started feeling tired and weak on Thursday but his fever didn't start until yesterday morning. He was having some malaise but no arthralgia or myalgias. His measured temp of 101 and continued to climb to 103. He took some ibuprofen and his temperature went down. At 3 am he started feeling warm again and his temperature was 103. [facility] to be checked out.  He denies any chest pain, SOB, abdominal pain, constipation, diarrhea, nausea, vomiting, or any new rashes.  He was last sexually active this past Monday but admits to using protection. His ulceration on his penis started about two weeks ago. He denies any sexual encounter near that time. It is intermittently painful and hasn't been draining. He started having some LAD in his groin that started recently as well. He has been drinking normally.   Review Of Systems: Per HPI with the following additions: See HPI  Otherwise 12 point review of systems was performed and was unremarkable.  Patient Active Problem List   Diagnosis Date Noted  . Fever 10/02/2014  . Gynecomastia 04/30/2014  . 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 Strain of right trapezius muscle 04/07/2014  . 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 Tobacco dependence 06/03/2012  . Fibromyalgia muscle pain 02/11/2012  . Depression 12/07/2011  . Sciatica 05/22/2011  .  HIV disease 02/15/2011  . Hypertension 02/15/2011  . Hepatitis B infection 02/15/2011  . Anxiety 02/02/2011   Past Medical History: Past Medical History  Diagnosis Date  . Anxiety   . Depression   . HIV infection     followed by Dr. Luciana Axe- sees him every 4 months  . Hypertension   . Anxiety   . Arthritis   . 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 Migraine   . IBS (irritable bowel syndrome)    Past Surgical History: Past  Surgical History  Procedure Laterality Date  . Wisdom tooth extraction    . High resolution anoscopy N/A 02/11/2013    Procedure: HIGH RESOLUTION ANOSCOPY WITH BIOPSY, LASER ABLATION;  Surgeon: Romie Levee, MD;  Location: [facility];  Service: General;  Laterality: N/A;  . Co2 laser application N/A 02/11/2013    Procedure: CO2 LASER APPLICATION;  Surgeon: Romie Levee, MD;  Location: [facility];  Service: General;  Laterality: N/A;   Social History: History  Substance Use Topics  . Smoking status: Current Every Day Smoker -- 0.50 packs/day for 15 years    Types: Cigarettes  . Smokeless tobacco: Never Used     Comment: may try something to quit  . Alcohol Use: Yes     Comment: occasional   Additional social history: no IV drug use, MSM   Please also refer to relevant sections of EMR.  Family History: Family History  Problem Relation Age of Onset  . Mental illness Neg Hx   . Hypertension Mother   . Diabetes Mother   . Stroke Mother     cerbral aneurysm  . Hypertension Brother    Allergies and Medications: Allergies  Allergen Reactions  . Bactrim [Sulfamethoxazole W/Trimethoprim (Co-Trimoxazole)] Hives and Shortness Of Breath  . Cymbalta [Duloxetine Hcl] Diarrhea  . Other Hives and Swelling    [employer] toothpaste   . Zoloft [Sertraline Hcl] Diarrhea  . Neurontin [Gabapentin] Rash   No current facility-administered medications on file prior to encounter.   Current Outpatient Prescriptions on File Prior to Encounter  Medication Sig Dispense Refill  . darunavir-cobicistat (PREZCOBIX) 800-150 MG per tablet Take 1 tablet by mouth daily. 30 tablet 5  . emtricitabine-tenofovir (TRUVADA) 200-300 MG per tablet Take 1 tablet by mouth daily. 30 tablet 5  . HYDROcodone-acetaminophen (NORCO) 5-325 MG per tablet Take 1 tablet by mouth every 6 (six) hours as needed for moderate pain. 30 tablet 0  . imiquimod (ALDARA) 5 % cream APPLY TO AFFECTED AREA 3  TIMES A WEEK 12 each 1  . lisinopril (PRINIVIL,ZESTRIL) 20 MG tablet TAKE 1 TABLET BY MOUTH EVERY DAY 30 tablet 3  . metoprolol succinate (TOPROL-XL) 25 MG 24 hr tablet Take 1 tablet (25 mg total) by mouth daily. 30 tablet 11  . nicotine (NICODERM CQ - DOSED IN MG/24 HOURS) 14 mg/24hr patch Place 1 patch (14 mg total) onto the skin daily. (Patient not taking: Reported on 08/30/2014) 14 patch 0  . nicotine (NICODERM CQ - DOSED IN MG/24 HOURS) 21 mg/24hr patch Place 1 patch (21 mg total) onto the skin daily. (Patient not taking: Reported on 08/30/2014) 42 patch 0  . nicotine (NICODERM CQ - DOSED IN MG/24 HR) 7 mg/24hr patch Place 1 patch (7 mg total) onto the skin daily. (Patient not taking: Reported on 08/30/2014) 14 patch 0  . ondansetron (ZOFRAN ODT) 4 MG disintegrating tablet  ODT q4 hours prn nausea/vomit (Patient not taking: Reported on  08/30/2014) 6 tablet 0  . [DISCONTINUED] gabapentin (NEURONTIN) 300 MG capsule Take 1 capsule (300 mg total) by mouth 3 (three) times daily. 90 capsule 3    Objective: BP 114/65 mmHg  Pulse 70  Temp(Src) 98.9 F (37.2 C) (Oral)  Resp 16  Ht 6' (1.829 m)  Wt 180 lb (81.647 kg)  BMI 24.41 kg/m2  SpO2 99% Exam: General: NAD, cooperative with exam.  HEENT: MMM, EOMI, [place]/AT, no oral lesions observed,  Cardiovascular: RRR, S1S2, no MRG  Respiratory: CTAB, normal effort, no wheezing, crackles  Abdomen: soft, NTND, +BS, no guarding or rigidity  Extremities: warm and well perfused, pulses intact  GU: ulcerated lesion on shaft of penis, right sided inguinal LAD, some erythema on right inguinal crease, no pustular lesions noted,  Skin: no rashes on feet or hands,  Neuro: Alert and oriented, no gross deficits   Labs and Imaging: CBC BMET   Recent Labs Lab 10/02/14 0504  WBC 12.0*  HGB 13.7  HCT 41.9  PLT 149*    Recent Labs Lab 10/02/14 0504  NA 134*  K 3.1*  CL 102  CO2 24  BUN 11  CREATININE 2.20*  GLUCOSE 223*  CALCIUM 8.7      Urinalysis    Component Value Date/Time   COLORURINE AMBER* 10/02/2014 0812   APPEARANCEUR CLEAR 10/02/2014 0812   LABSPEC 1.015 10/02/2014 0812   PHURINE 5.0 10/02/2014 0812   GLUCOSEU NEGATIVE 10/02/2014 0812   HGBUR TRACE* 10/02/2014 0812   BILIRUBINUR SMALL* 10/02/2014 0812   KETONESUR 15* 10/02/2014 0812   PROTEINUR 30* 10/02/2014 0812   UROBILINOGEN 1.0 10/02/2014 0812   NITRITE NEGATIVE 10/02/2014 0812   LEUKOCYTESUR TRACE* 10/02/2014 0812   CXR: no active disease   Myra Rude, MD 10/02/2014, 9:05 AM PGY-2, [place] [facility] FPTS Intern pager: [phone], text pages welcome

## 2014-10-02 NOTE — ED Provider Notes (Signed)
CSN: [id number]     Arrival date & time 10/02/14  0424 History   First MD Initiated Contact with Patient 10/02/14 [phone]     Chief Complaint  Patient presents with  . Fever     (Consider location/radiation/quality/duration/timing/severity/associated sxs/prior Treatment) Patient is a 39 y.o. male presenting with general illness.  Illness Location:  Generalized Quality:  Malaise, near syncope Severity:  Moderate Onset quality:  Gradual Duration:  2 days Timing:  Constant Progression:  Worsening Chronicity:  New Context:  HIV, well controlled Relieved by:  Nothing Worsened by:  Nothing Associated symptoms: cough (very mild) and fever   Associated symptoms: no abdominal pain, no congestion, no fatigue, no myalgias, no nausea, no rash, no shortness of breath and no vomiting     Past Medical History  Diagnosis Date  . Anxiety   . Depression   . HIV infection     followed by Dr. Luciana Axe- sees him every 4 months  . Hypertension   . Anxiety   . Arthritis   . 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 Migraine   . IBS (irritable bowel syndrome)    Past Surgical History  Procedure Laterality Date  . Wisdom tooth extraction    . High resolution anoscopy N/A 02/11/2013    Procedure: HIGH RESOLUTION ANOSCOPY WITH BIOPSY, LASER ABLATION;  Surgeon: Romie Levee, MD;  Location: [facility];  Service: General;  Laterality: N/A;  . Co2 laser application N/A 02/11/2013    Procedure: CO2 LASER APPLICATION;  Surgeon: Romie Levee, MD;  Location: [facility];  Service: General;  Laterality: N/A;   Family History  Problem Relation Age of Onset  . Mental illness Neg Hx   . Hypertension Mother   . Diabetes Mother   . Stroke Mother     cerbral aneurysm  . Hypertension Brother    History  Substance Use Topics  . Smoking status: Current Every Day Smoker -- 0.50 packs/day for 15 years    Types: Cigarettes  . Smokeless tobacco: Never Used     Comment: may try  something to quit  . Alcohol Use: Yes     Comment: occasional    Review of Systems  Constitutional: Positive for fever. Negative for fatigue.  HENT: Negative for congestion.   Respiratory: Positive for cough (very mild). Negative for shortness of breath.   Gastrointestinal: Negative for nausea, vomiting and abdominal pain.  Musculoskeletal: Negative for myalgias.  Skin: Negative for rash.  All other systems reviewed and are negative.     Allergies  Bactrim; Cymbalta; Other; Zoloft; and Neurontin  Home Medications   Prior to Admission medications   Medication Sig Start Date End Date Taking? Authorizing Provider  darunavir-cobicistat (PREZCOBIX) 800-150 MG per tablet Take 1 tablet by mouth daily. 09/06/14  Yes Cliffton Asters, MD  emtricitabine-tenofovir (TRUVADA) 200-300 MG per tablet Take 1 tablet by mouth daily. 09/06/14  Yes Cliffton Asters, MD  HYDROcodone-acetaminophen (NORCO) 5-325 MG per tablet Take 1 tablet by mouth every 6 (six) hours as needed for moderate pain. 09/25/13  Yes Garnetta Buddy, MD  imiquimod (ALDARA) 5 % cream APPLY TO AFFECTED AREA 3 TIMES A WEEK 09/13/14  Yes Randall Hiss, MD  lisinopril (PRINIVIL,ZESTRIL) 20 MG tablet TAKE 1 TABLET BY MOUTH EVERY DAY 06/08/14  Yes Ginnie Smart, MD  metoprolol succinate (TOPROL-XL) 25 MG 24 hr tablet Take 1 tablet (25 mg total) by mouth daily. 08/30/14  Yes Lisette Grinder  Daiva EvesVan Dam, MD  nicotine (NICODERM CQ - DOSED IN MG/24 HOURS) 14 mg/24hr patch Place 1 patch (14 mg total) onto the skin daily. Patient not taking: Reported on 08/30/2014 04/30/14   Randall Hissornelius N Van Dam, MD  nicotine (NICODERM CQ - DOSED IN MG/24 HOURS) 21 mg/24hr patch Place 1 patch (21 mg total) onto the skin daily. Patient not taking: Reported on 08/30/2014 04/30/14   Randall Hissornelius N Van Dam, MD  nicotine (NICODERM CQ - DOSED IN MG/24 HR) 7 mg/24hr patch Place 1 patch (7 mg total) onto the skin daily. Patient not taking: Reported on 08/30/2014 04/30/14    Randall Hissornelius N Van Dam, MD  ondansetron (ZOFRAN ODT) 4 MG disintegrating tablet 4mg  ODT q4 hours prn nausea/vomit Patient not taking: Reported on 08/30/2014 03/16/14   Rolan BuccoMelanie Belfi, MD   BP 114/65 mmHg  Pulse 70  Temp(Src) 98.9 F (37.2 C) (Oral)  Resp 16  Ht 6' (1.829 m)  Wt 180 lb (81.647 kg)  BMI 24.41 kg/m2  SpO2 99% Physical Exam  Constitutional: He is oriented to person, place, and time. He appears well-developed and well-nourished.  HENT:  Head: Normocephalic and atraumatic.  Eyes: Conjunctivae and EOM are normal.  Neck: Normal range of motion. Neck supple.  Cardiovascular: Normal rate, regular rhythm and normal heart sounds.   Pulmonary/Chest: Effort normal and breath sounds normal. No respiratory distress.  Abdominal: He exhibits no distension. There is no tenderness. There is no rebound and no guarding.  Musculoskeletal: Normal range of motion.       Cervical back: Normal.       Thoracic back: Normal.       Lumbar back: Normal.  Neurological: He is alert and oriented to person, place, and time.  Skin: Skin is warm and dry.  Vitals reviewed.   ED Course  Procedures (including critical care time) Labs Review Labs Reviewed  CBC WITH DIFFERENTIAL/PLATELET - Abnormal; Notable for the following:    WBC 12.0 (*)    Platelets 149 (*)    Neutrophils Relative % 85 (*)    Neutro Abs 10.3 (*)    Lymphocytes Relative 11 (*)    All other components within normal limits  COMPREHENSIVE METABOLIC PANEL - Abnormal; Notable for the following:    Sodium 134 (*)    Potassium 3.1 (*)    Glucose, Bld 223 (*)    Creatinine, Ser 2.20 (*)    Albumin 3.4 (*)    GFR calc non Af Amer 36 (*)    GFR calc Af Amer 42 (*)    All other components within normal limits  I-STAT CG4 LACTIC ACID, ED - Abnormal; Notable for the following:    Lactic Acid, Venous 3.98 (*)    All other components within normal limits  CULTURE, BLOOD (ROUTINE X 2)  CULTURE, BLOOD (ROUTINE X 2)  URINE CULTURE   URINALYSIS, ROUTINE W REFLEX MICROSCOPIC    Imaging Review Dg Chest 2 View  10/02/2014   CLINICAL DATA:  Fever and cough for 1 day.  Initial encounter.  EXAM: CHEST  2 VIEW  COMPARISON:  Chest radiograph performed 03/16/2014  FINDINGS: The lungs are well-aerated and clear. There is no evidence of focal opacification, pleural effusion or pneumothorax.  The heart is normal in size; the mediastinal contour is within normal limits. No acute osseous abnormalities are seen.  IMPRESSION: No acute cardiopulmonary process seen.   Electronically Signed   By: Roanna RaiderJeffery  Chang M.D.   On: 10/02/2014 05:44     EKG  Interpretation None      MDM   Final diagnoses:  Fever, unspecified fever cause  Viral syndrome    39 y.o. male with pertinent PMH of HIV, well controlled presents with fever and malaise. On arrival today the patient's vital signs and physical exam as above. With regards to the source of his fever, patient endorses mild congestion and rhinorrhea, however is otherwise without headache, dysuria, GI symptoms.  Workup returned with elevated lactic acid, bump in creatinine. Consulted family medicine for admission. Patient admitted in stable condition..    I have reviewed all laboratory and imaging studies if ordered as above  1. Fever, unspecified fever cause   2. Viral syndrome         Mirian Mo, MD 10/02/14 0900

## 2014-10-02 NOTE — ED Notes (Signed)
Fever all day today.  No pain.  The pt took 800mg  of ibu 30 minutes ago.

## 2014-10-02 NOTE — ED Notes (Signed)
Pt unable to void 

## 2014-10-02 NOTE — Progress Notes (Signed)
RN called for report.  

## 2014-10-02 NOTE — Progress Notes (Signed)
I will co sign the H & PE when available.  I have already seen and examined.  Discussed treatment plan with Dr. Jordan LikesSchmitz.  Briefly, 39 yo HIV + but immunocompetent patient presents with 24 hours of fever - impressive with documented 103.  Minimal cough.  Only skin source is moderate, tender right inguinal lymphadenopathy.  No urinary symptoms and non focal exam.  He also has acute kidney injury presumed secondary to the combo of dehydration (did not eat or drink much in the 24 hours pta and the febrile illness.  Hydration is the key and we will follow.  Hold lisinopril.  Await cultures.  Would add doxy as empiric treatment of this right inguinal lymphadenitis.

## 2014-10-02 NOTE — ED Notes (Signed)
Attempted report 

## 2014-10-02 NOTE — Progress Notes (Signed)
NURSING PROGRESS NOTE  Marilynne HalstedShanna M Stampley [id number] Admission Data: 10/02/2014 10:07 AM Attending Provider: Moses MannersWilliam A Hensel, MD WUJ:[id number]:Schmitz, Marye RoundJeremy E, MD Code Status: full   Marilynne HalstedShanna M Weatherall is a 39 y.o. male patient admitted from ED:  -No acute distress noted.  -No complaints of shortness of breath.  -No complaints of chest pain.    Blood pressure 104/66, pulse 65, temperature 98.9 F (37.2 C), temperature source Oral, resp. rate 16, height 6' (1.829 m), weight 81.647 kg (180 lb), SpO2 98 %.   IV Fluids:  IV in place, occlusive dsg intact without redness, IV cath forearm right, condition patent and no redness normal saline lock.   Allergies:  Bactrim; Cymbalta; Other; Zoloft; and Neurontin  Past Medical History:   has a past medical history of Anxiety; Depression; HIV infection; Hypertension; Anxiety; Arthritis; Chronic hepatitis B; Migraine; and IBS (irritable bowel syndrome).  Past Surgical History:   has past surgical history that includes Wisdom tooth extraction; High resolution anoscopy (N/A, 02/11/2013); and Co2 laser application (N/A, 02/11/2013).  Social History:   reports that he has been smoking Cigarettes.  He has a 7.5 pack-year smoking history. He has never used smokeless tobacco. He reports that he drinks alcohol. He reports that he does not use illicit drugs.  Skin: warm dry intact   Patient/Family orientated to room. Information packet given to patient/family. Admission inpatient armband information verified with patient/family to include name and date of birth and placed on patient arm. Side rails up x 2, fall assessment and education completed with patient/family. Patient/family able to verbalize understanding of risk associated with falls and verbalized understanding to call for assistance before getting out of bed. Call light within reach. Patient/family able to voice and demonstrate understanding of unit orientation instructions.    Will continue to evaluate and  treat per MD orders.

## 2014-10-02 NOTE — ED Notes (Signed)
Dr Littie DeedsGentry given a copy of lactic acid results 3.98

## 2014-10-02 NOTE — ED Notes (Signed)
Family medicine at bedside.

## 2014-10-03 DIAGNOSIS — R590 Localized enlarged lymph nodes: Secondary | ICD-10-CM | POA: Diagnosis present

## 2014-10-03 DIAGNOSIS — B20 Human immunodeficiency virus [HIV] disease: Secondary | ICD-10-CM | POA: Diagnosis present

## 2014-10-03 DIAGNOSIS — R197 Diarrhea, unspecified: Secondary | ICD-10-CM | POA: Diagnosis present

## 2014-10-03 DIAGNOSIS — B181 Chronic viral hepatitis B without delta-agent: Secondary | ICD-10-CM | POA: Diagnosis present

## 2014-10-03 DIAGNOSIS — N485 Ulcer of penis: Secondary | ICD-10-CM | POA: Diagnosis present

## 2014-10-03 LAB — CBC
HEMATOCRIT: 39.7 % (ref 39.0–52.0)
Hemoglobin: 12.9 g/dL — ABNORMAL LOW (ref 13.0–17.0)
MCH: 29.9 pg (ref 26.0–34.0)
MCHC: 32.5 g/dL (ref 30.0–36.0)
MCV: 92.1 fL (ref 78.0–100.0)
Platelets: 103 10*3/uL — ABNORMAL LOW (ref 150–400)
RBC: 4.31 MIL/uL (ref 4.22–5.81)
RDW: 13.3 % (ref 11.5–15.5)
WBC: 11.7 10*3/uL — AB (ref 4.0–10.5)

## 2014-10-03 LAB — BASIC METABOLIC PANEL
Anion gap: 3 — ABNORMAL LOW (ref 5–15)
BUN: 7 mg/dL (ref 6–23)
CALCIUM: 8.2 mg/dL — AB (ref 8.4–10.5)
CO2: 24 mmol/L (ref 19–32)
Chloride: 108 mmol/L (ref 96–112)
Creatinine, Ser: 1.5 mg/dL — ABNORMAL HIGH (ref 0.50–1.35)
GFR calc non Af Amer: 57 mL/min — ABNORMAL LOW (ref >90)
GFR, EST AFRICAN AMERICAN: 67 mL/min — AB (ref >90)
GLUCOSE: 102 mg/dL — AB (ref 70–99)
Potassium: 4.1 mmol/L (ref 3.5–5.1)
Sodium: 135 mmol/L (ref 135–145)

## 2014-10-03 LAB — CLOSTRIDIUM DIFFICILE BY PCR: CDIFFPCR: NEGATIVE

## 2014-10-03 MED ORDER — VALACYCLOVIR HCL 500 MG PO TABS
500.0000 mg | ORAL_TABLET | Freq: Two times a day (BID) | ORAL | Status: DC
  Administered 2014-10-03 – 2014-10-06 (×7): 500 mg via ORAL
  Filled 2014-10-03 (×8): qty 1

## 2014-10-03 MED ORDER — PENICILLIN G BENZATHINE 1200000 UNIT/2ML IM SUSP
2.4000 10*6.[IU] | Freq: Once | INTRAMUSCULAR | Status: AC
  Administered 2014-10-03: 2.4 10*6.[IU] via INTRAMUSCULAR
  Filled 2014-10-03: qty 4

## 2014-10-03 NOTE — Consult Note (Addendum)
[facility]    Date of Admission:  10/02/2014    Benzathine penicillin yesterday        Day 2 doxycycline       Reason for Consult: HIV infection, renal insufficiency and fever  Referring Physician:  Dr. Clare Gandy  Active Problems:   Fever   Penile ulcer   Inguinal adenopathy   Diarrhea   Essential hypertension   Human immunodeficiency virus (HIV) disease   Chronic hepatitis B   . darunavir-cobicistat  1 tablet Oral Daily  . doxycycline  100 mg Oral Q12H  . emtricitabine-tenofovir  1 tablet Oral Daily  . heparin  5,000 Units Subcutaneous 3 times per day  . metoprolol succinate  25 mg Oral Daily    Recommendations: 1. Discontinue doxycycline 2. Valacyclovir 500 mg twice a day pending HSV cultures 3. Await results of RPR 4. Stool for culture and C. difficile PCR 5. Enteric precautions 6. Hold Truvada and Prezcobix 7. Check hepatitis B viral load  Assessment: The cause of his fever is not entirely clear. It could be related to his diarrhea or penile ulcer associated with inguinal adenopathy. The most likely cause of his ulcer is herpes simplex or primary syphilis. Chancroid, a more common cause of inguinal bubos, is a much rarer cause of genital ulcers in the [place]. I will get stool specimens for culture and C. difficile PCR and treat him empirically for herpes simplex. I will hold off on empiric therapy for chancroid now.  His HIV infection is under reasonably good control with his current antiretroviral regimen. However, he has developed some renal insufficiency over the past 6 weeks that could be related to tenofovir. I will hold his antiretroviral medication for now and discuss treatment options with Dr. Daiva Eves. He may be a good candidate for the new drug Genvoya which contains tenofovir alafenamide (TAF) which carries much less risk of nephrotoxicity.   HPI: Phillip Gill is a 39 y.o. male  with HIV and hepatitis B coinfection  who is followed by my partner, Dr. Daiva Eves. His HIV infection has been well-controlled with Truvada and Prezcobix. The tenofovir component of Truvada is also being used to treat his hepatitis B. He was admitted yesterday after developing high fever 48 hours ago. He's also been having some diarrhea for the past 48 hours. He states that about 2 weeks ago he developed a sore on his penis. It is slightly painful. He has had sores like that previously. He attributes this or to masturbation and trauma. He is also had painful swelling of his right inguinal lymph nodes for the past week. He has been sexually active with one partner but states that he always uses condoms. Yesterday he received benzathine penicillin 2.4 million units IM and was started on doxycycline.   Review of Systems: Constitutional: positive for chills and fevers, negative for anorexia, sweats and weight loss Eyes: negative Ears, nose, mouth, throat, and face: negative Respiratory: negative Cardiovascular: negative Gastrointestinal: positive for diarrhea, negative for abdominal pain, nausea, odynophagia and vomiting Genitourinary:negative for dysuria and frequency  Past Medical History  Diagnosis Date  . Anxiety   . Depression   . HIV infection     followed by Dr. Luciana Axe- sees him every 4 months  . Hypertension   . Anxiety   . Arthritis   . 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 Migraine   . IBS (  irritable bowel syndrome)     History  Substance Use Topics  . Smoking status: Current Every Day Smoker -- 0.50 packs/day for 15 years    Types: Cigarettes  . Smokeless tobacco: Never Used     Comment: may try something to quit  . Alcohol Use: Yes     Comment: occasional    Family History  Problem Relation Age of Onset  . Mental illness Neg Hx   . Hypertension Mother   . Diabetes Mother   . Stroke Mother     cerbral aneurysm  . Hypertension Brother    Allergies  Allergen Reactions  . Bactrim [Sulfamethoxazole  W/Trimethoprim (Co-Trimoxazole)] Hives and Shortness Of Breath  . Cymbalta [Duloxetine Hcl] Diarrhea  . Other Hives and Swelling    [employer] toothpaste   . Zoloft [Sertraline Hcl] Diarrhea  . Neurontin [Gabapentin] Rash    OBJECTIVE: Blood pressure 120/71, pulse 84, temperature 99.1 F (37.3 C), temperature source Oral, resp. rate 18, height 6' (1.829 m), weight 180 lb (81.647 kg), SpO2 96 %. General:  He is alert and in no distress Skin:  No rash Neck: Supple Oral: No oropharyngeal lesions Lungs:  Clear Cor:  Regular S1 and S2 with no murmurs Abdomen:  Soft and nontender with no palpable masses. Quiet bowel sounds. GU: His right inguinal nodes are enlarged and slightly tender. He has an irregularly shaped ulcer at the base of the glans penis measures about 8 mm in diameter Extremities: No acute abnormalities  Lab Results Lab Results  Component Value Date   WBC 11.7* 10/03/2014   HGB 12.9* 10/03/2014   HCT 39.7 10/03/2014   MCV 92.1 10/03/2014   PLT 103* 10/03/2014    Lab Results  Component Value Date   CREATININE 1.50* 10/03/2014   BUN 7 10/03/2014   NA 135 10/03/2014   K 4.1 10/03/2014   CL 108 10/03/2014   CO2 24 10/03/2014    Lab Results  Component Value Date   ALT 11 10/02/2014   AST 31 10/02/2014   ALKPHOS 77 10/02/2014   BILITOT 0.4 10/02/2014    HIV 1 RNA QUANT (copies/mL)  Date Value  08/30/2014 43*  04/15/2014 <20  12/07/2013 <20   CD4 T CELL ABS (/uL)  Date Value  08/30/2014 850  04/15/2014 980  12/07/2013 1110    Microbiology: Recent Results (from the past 240 hour(s))  Blood culture (routine x 2)     Status: None (Preliminary result)   Collection Time: 10/02/14  4:55 AM  Result Value Ref Range Status   Specimen Description BLOOD LEFT ARM  Final   Special Requests BOTTLES DRAWN AEROBIC AND ANAEROBIC 10CC  Final   Culture   Final           BLOOD CULTURE RECEIVED NO GROWTH TO DATE CULTURE WILL BE HELD FOR 5 DAYS BEFORE ISSUING A FINAL  NEGATIVE REPORT Performed at [facility]    Report Status PENDING  Incomplete  Blood culture (routine x 2)     Status: None (Preliminary result)   Collection Time: 10/02/14  5:01 AM  Result Value Ref Range Status   Specimen Description BLOOD RIGHT ARM  Final   Special Requests BOTTLES DRAWN AEROBIC ONLY 10CC  Final   Culture   Final           BLOOD CULTURE RECEIVED NO GROWTH TO DATE CULTURE WILL BE HELD FOR 5 DAYS BEFORE ISSUING A FINAL NEGATIVE REPORT Performed at [facility]    Report  Status PENDING  Incomplete  Herpes simplex virus culture     Status: None (Preliminary result)   Collection Time: 10/02/14  4:42 PM  Result Value Ref Range Status   Specimen Description PENIS  Final   Special Requests Immunocompromised  Final   Culture   Final    Culture has been initiated. [facility]formed at [facility]    Report Status PENDING  Incomplete    Phillip AstersJohn Ramiyah Mcclenahan, MD [facility] [phone] pager   [phone] cell 10/03/2014, 1:25 PM

## 2014-10-03 NOTE — Progress Notes (Signed)
Family Medicine Teaching Service Daily Progress Note Intern Pager: [phone]  Patient name: Phillip Gill Medical record number: [id number] Date of birth: [birth year] Age: 39 y.o. Gender: male  Primary Care Provider: Myra RudeSchmitz, Roddy Bellamy E, MD Consultants:NOne  Code Status: Full   Pt Overview and Major Events to Date:  4/2: admitted for Gill.   ABX/Culture Doxycycline 4/2 >> pen G 2.4 million units IM x 1   Blood cx 4/2  Urine cx 4/2  Assessment and Plan: Phillip Gill. PMH is significant for HIV, HTN, anxiety, Hep b infection, tobacco dependence, and fibromyalgia.   #Gill: still febrile yesterday to a max of 103.1. He feels better today with some of the erythema in his groin improved.  - Flu swab: neg   - Urine cytology: pending - RPR : pending  - HSV culture : pending  - doxycycline 100 mg BID  - pen G 2.4 million units IM x 1   - Blood cx pending  - urine cx pending   #AKI/ATN: FeNA indicates a prerenal process. Repleting with fluids.  - fluids 75 mL/hr,  - encourage PO   #Hypokalemia: not reporting any GI losses. May be 2/2 to his kidney insult  - Kdur 40 mEq x 1   #Hyperglycemia: no history of diabetes.  - check Hgb A1c   #HIV: Spoke with ID this morning they will see him today. They will makes adjustments to his home regimen as needed.   - continue home medications.   #HTN: controlled.  - holding lisinopril in setting of AKI  - continue metoprolol   #Anxiety: hx of of and appears stable currently   FEN/GI: NS 75 mL/hr/ Heart healthy  Prophylaxis: hep subq  Disposition: discharge today or tomorrow.   Subjective:  Patient would like to leave today and feels better.   Objective: Temp:  [98.2 F (36.8 C)-103.1 F (39.5 C)] 99.1 F (37.3 C) (04/03 0603) Pulse Rate:  [65-95] 84 (04/03 0603) Resp:  [16-18] 18 (04/03 0603) BP: (104-141)/(65-87) 120/71 mmHg (04/03 0603) SpO2:  [94 %-100 %] 96 %  (04/03 0603) Weight:  [180 lb (81.647 kg)] 180 lb (81.647 kg) (04/02 1015) Physical Exam: General: NAD, cooperative with exam.  Cardiovascular: RRR, S1S2, no MRG  Respiratory: CTAB, normal effort, no wheezing, crackles  Extremities: warm and well perfused, pulses intact  GU: ulcerated lesion on shaft of penis, right sided inguinal LAD, erythema improved this morning   Skin: no rashes on feet or hands,  Neuro: Alert and oriented, no gross deficits   Laboratory:  Recent Labs Lab 10/02/14 0504  WBC 12.0*  HGB 13.7  HCT 41.9  PLT 149*    Recent Labs Lab 10/02/14 0504  NA 134*  K 3.1*  CL 102  CO2 24  BUN 11  CREATININE 2.20*  CALCIUM 8.7  PROT 7.7  BILITOT 0.4  ALKPHOS 77  ALT 11  AST 31  GLUCOSE 223*   Imaging/Diagnostic Tests:   Myra RudeJeremy E Shalen Petrak, MD 10/03/2014, 8:19 AM PGY-2,  Family Medicine FPTS Intern pager: [phone], text pages welcome

## 2014-10-03 NOTE — Progress Notes (Signed)
UR completed 

## 2014-10-04 DIAGNOSIS — R590 Localized enlarged lymph nodes: Secondary | ICD-10-CM

## 2014-10-04 LAB — CBC
HCT: 35.4 % — ABNORMAL LOW (ref 39.0–52.0)
Hemoglobin: 11.5 g/dL — ABNORMAL LOW (ref 13.0–17.0)
MCH: 29.3 pg (ref 26.0–34.0)
MCHC: 32.5 g/dL (ref 30.0–36.0)
MCV: 90.3 fL (ref 78.0–100.0)
Platelets: 93 10*3/uL — ABNORMAL LOW (ref 150–400)
RBC: 3.92 MIL/uL — ABNORMAL LOW (ref 4.22–5.81)
RDW: 13.3 % (ref 11.5–15.5)
WBC: 12.2 10*3/uL — ABNORMAL HIGH (ref 4.0–10.5)

## 2014-10-04 LAB — HEMOGLOBIN A1C
Hgb A1c MFr Bld: 5.5 % (ref 4.8–5.6)
Mean Plasma Glucose: 111 mg/dL

## 2014-10-04 LAB — URINE CULTURE: Colony Count: 50000

## 2014-10-04 LAB — BASIC METABOLIC PANEL
Anion gap: 3 — ABNORMAL LOW (ref 5–15)
BUN: 8 mg/dL (ref 6–23)
CO2: 25 mmol/L (ref 19–32)
CREATININE: 1.43 mg/dL — AB (ref 0.50–1.35)
Calcium: 8.1 mg/dL — ABNORMAL LOW (ref 8.4–10.5)
Chloride: 108 mmol/L (ref 96–112)
GFR calc Af Amer: 71 mL/min — ABNORMAL LOW (ref >90)
GFR, EST NON AFRICAN AMERICAN: 61 mL/min — AB (ref >90)
Glucose, Bld: 111 mg/dL — ABNORMAL HIGH (ref 70–99)
Potassium: 3.3 mmol/L — ABNORMAL LOW (ref 3.5–5.1)
Sodium: 136 mmol/L (ref 135–145)

## 2014-10-04 LAB — HERPES SIMPLEX VIRUS CULTURE: CULTURE: NOT DETECTED

## 2014-10-04 MED ORDER — POTASSIUM CHLORIDE CRYS ER 20 MEQ PO TBCR
30.0000 meq | EXTENDED_RELEASE_TABLET | Freq: Two times a day (BID) | ORAL | Status: AC
  Administered 2014-10-04 (×2): 30 meq via ORAL
  Filled 2014-10-04 (×2): qty 1

## 2014-10-04 MED ORDER — ELVITEG-COBIC-EMTRICIT-TENOFAF 150-150-200-10 MG PO TABS
1.0000 | ORAL_TABLET | Freq: Every day | ORAL | Status: DC
  Administered 2014-10-05 – 2014-10-07 (×3): 1 via ORAL
  Filled 2014-10-04 (×4): qty 1

## 2014-10-04 MED ORDER — ACETAMINOPHEN 650 MG RE SUPP
650.0000 mg | Freq: Four times a day (QID) | RECTAL | Status: DC
  Filled 2014-10-04 (×8): qty 1

## 2014-10-04 MED ORDER — ACETAMINOPHEN 325 MG PO TABS
650.0000 mg | ORAL_TABLET | Freq: Four times a day (QID) | ORAL | Status: DC
  Administered 2014-10-04 – 2014-10-06 (×8): 650 mg via ORAL
  Filled 2014-10-04 (×8): qty 2

## 2014-10-04 NOTE — Progress Notes (Signed)
[facility]    Subjective: Still having quite high fevers and still having loose stools with eating.   Antibiotics:  Anti-infectives    Start     Dose/Rate Route Frequency Ordered Stop   10/03/14 1430  valACYclovir (VALTREX) tablet 500 mg     500 mg Oral 2 times daily 10/03/14 1345     10/03/14 1000  penicillin g benzathine (BICILLIN LA) 1200000 UNIT/2ML injection 2.4 Million Units     2.4 Million Units Intramuscular  Once 10/03/14 0853 10/03/14 1058   10/02/14 2200  doxycycline (VIBRA-TABS) tablet 100 mg  Status:  Discontinued    Comments:  Please hold until after RPR is collected.   100 mg Oral Every 12 hours 10/02/14 1335 10/03/14 1345   10/02/14 1330  doxycycline (VIBRA-TABS) tablet 100 mg  Status:  Discontinued     100 mg Oral Every 12 hours 10/02/14 1300 10/02/14 1335   10/02/14 1100  darunavir-cobicistat (PREZCOBIX) 800-150 MG per tablet 1 tablet  Status:  Discontinued     1 tablet Oral Daily 10/02/14 1009 10/03/14 1345   10/02/14 1100  emtricitabine-tenofovir (TRUVADA) 200-300 MG per tablet 1 tablet  Status:  Discontinued     1 tablet Oral Daily 10/02/14 1009 10/03/14 1345      Medications: Scheduled Meds: . acetaminophen  650 mg Oral Q6H   Or  . acetaminophen  650 mg Rectal Q6H  . heparin  5,000 Units Subcutaneous 3 times per day  . metoprolol succinate  25 mg Oral Daily  . potassium chloride  30 mEq Oral BID  . valACYclovir  500 mg Oral BID   Continuous Infusions: . sodium chloride 75 mL/hr at 10/04/14 1117   PRN Meds:.ondansetron **OR** ondansetron (ZOFRAN) IV, senna-docusate    Objective: Weight change:   Intake/Output Summary (Last 24 hours) at 10/04/14 1616 Last data filed at 10/04/14 0639  Gross per 24 hour  Intake    900 ml  Output      0 ml  Net    900 ml   Blood pressure 134/82, pulse 98, temperature 102.9 F (39.4 C), temperature source Oral, resp. rate 16, height 6' (1.829 m), weight 180 lb (81.647 kg), SpO2 100  %. Temp:  [98.3 F (36.8 C)-103.1 F (39.5 C)] 102.9 F (39.4 C) (04/04 1515) Pulse Rate:  [96-98] 98 (04/04 1404) Resp:  [16-18] 16 (04/04 1404) BP: (127-134)/(77-82) 134/82 mmHg (04/04 1404) SpO2:  [96 %-100 %] 100 % (04/04 1404)  Physical Exam: General: Alert and awake, oriented x3, not in any acute distress. HEENT: anicteric sclera, EOMI CVS regular rate, normal r,  no murmur rubs or gallops Chest: clear to auscultation bilaterally, no wheezing, rales or rhonchi Abdomen: soft nontender, nondistended, normal bowel sounds,  Lymph: he has R > left sided inguinal adenopathy  GU: Now painful ulcer on the shaft of his penis extending to  near the corona  10/04/14:      Neuro: nonfocal  CBC: CBC Latest Ref Rng 10/04/2014 10/03/2014 10/02/2014  WBC 4.0 - 10.5 K/uL 12.2(H) 11.7(H) 12.0(H)  Hemoglobin 13.0 - 17.0 g/dL 11.5(L) 12.9(L) 13.7  Hematocrit 39.0 - 52.0 % 35.4(L) 39.7 41.9  Platelets 150 - 400 K/uL 93(L) 103(L) 149(L)       BMET  Recent Labs  10/03/14 0835 10/04/14 0734  NA 135 136  K 4.1 3.3*  CL 108 108  CO2 24 25  GLUCOSE 102* 111*  BUN 7 8  CREATININE 1.50* 1.43*  CALCIUM 8.2*  8.1*     Liver Panel   Recent Labs  10/02/14 0504  PROT 7.7  ALBUMIN 3.4*  AST 31  ALT 11  ALKPHOS 77  BILITOT 0.4       Sedimentation Rate No results for input(s): ESRSEDRATE in the last 72 hours. C-Reactive Protein No results for input(s): CRP in the last 72 hours.  Micro Results: Recent Results (from the past 720 hour(s))  Blood culture (routine x 2)     Status: None (Preliminary result)   Collection Time: 10/02/14  4:55 AM  Result Value Ref Range Status   Specimen Description BLOOD LEFT ARM  Final   Special Requests BOTTLES DRAWN AEROBIC AND ANAEROBIC 10CC  Final   Culture   Final           BLOOD CULTURE RECEIVED NO GROWTH TO DATE CULTURE WILL BE HELD FOR 5 DAYS BEFORE ISSUING A FINAL NEGATIVE REPORT Performed at [facility]    Report Status  PENDING  Incomplete  Blood culture (routine x 2)     Status: None (Preliminary result)   Collection Time: 10/02/14  5:01 AM  Result Value Ref Range Status   Specimen Description BLOOD RIGHT ARM  Final   Special Requests BOTTLES DRAWN AEROBIC ONLY 10CC  Final   Culture   Final           BLOOD CULTURE RECEIVED NO GROWTH TO DATE CULTURE WILL BE HELD FOR 5 DAYS BEFORE ISSUING A FINAL NEGATIVE REPORT Performed at [facility]    Report Status PENDING  Incomplete  Urine culture     Status: None   Collection Time: 10/02/14  8:12 AM  Result Value Ref Range Status   Specimen Description URINE, CLEAN CATCH  Final   Special Requests NONE  Final   Colony Count   Final    50,000 COLONIES/ML Performed at [facility]    Culture   Final    STAPHYLOCOCCUS SPECIES (COAGULASE NEGATIVE) Note: RIFAMPIN AND GENTAMICIN SHOULD NOT BE USED AS SINGLE DRUGS FOR TREATMENT OF STAPH INFECTIONS. Performed at [facility]    Report Status 10/04/2014 FINAL  Final   Organism ID, Bacteria STAPHYLOCOCCUS SPECIES (COAGULASE NEGATIVE)  Final      Susceptibility   Staphylococcus species (coagulase negative) - MIC*    GENTAMICIN 4 SENSITIVE Sensitive     LEVOFLOXACIN 4 INTERMEDIATE Intermediate     NITROFURANTOIN <=16 SENSITIVE Sensitive     OXACILLIN >=4 RESISTANT Resistant     PENICILLIN >=0.5 RESISTANT Resistant     RIFAMPIN <=0.5 SENSITIVE Sensitive     TRIMETH/SULFA 80 RESISTANT Resistant     VANCOMYCIN 2 SENSITIVE Sensitive     TETRACYCLINE >=16 RESISTANT Resistant     * STAPHYLOCOCCUS SPECIES (COAGULASE NEGATIVE)  Herpes simplex virus culture     Status: None   Collection Time: 10/02/14  4:42 PM  Result Value Ref Range Status   Specimen Description PENIS  Final   Special Requests Immunocompromised  Final   Culture   Final    No Herpes Simplex Virus detected. Performed at [facility]    Report Status 10/04/2014 FINAL  Final  Stool culture     Status: None (Preliminary  result)   Collection Time: 10/03/14  6:19 PM  Result Value Ref Range Status   Specimen Description STOOL  Final   Special Requests Normal  Final   Culture   Final    Culture reincubated for better growth Performed at [facility]  Report Status PENDING  Incomplete  Clostridium Difficile by PCR     Status: None   Collection Time: 10/03/14  6:20 PM  Result Value Ref Range Status   C difficile by pcr NEGATIVE NEGATIVE Final    Studies/Results: No results found.    Assessment/Plan:  Active Problems:   Fever   Essential hypertension   Human immunodeficiency virus (HIV) disease   Chronic hepatitis B   Penile ulcer   Inguinal adenopathy   Diarrhea    Phillip Gill is a 39 y.o. male with  Four-week history of painful penile ulcer more recent abrupt onset of loose stools with eating high fevers. He has received a dose of penicillin for presumptive possible syphilis as well as doxycycline for possible LGV but is still persistently febrile.  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 Certainly the Jarisch Herxheimer reaction could be in play here.  LGV --which he could certainly have should respond to prolonged doxycyline  H ducreyi is a less likely but another possibility  GC not cause ulcerative genital disease but rather appear on discharge the penis. It can certainly cause rectal pain and discharge as well.  GC and chlamydia been sent from urine and if he is still febrile would consider him. Therapy for possible anal GC with Rocephin and 1 gram azithromcyin  (the latter two abx would rx H ducreyi  Would continue the valtrex as well, I do not trust the HSV culture, PCR would have performed better  #2 Fevers: seems likely to be either related to his painful genital disease and or his loose stools  #3 Loose stools: C. Difficile PCR negative stool cultures unrevealing  Would  also send stool for Giardia cryptosporidium which you be higher risk for getting with anal receptive or insertive  intercourse.   #4 HIV: it seems we could change him to [facility] with food to avoid the TDF form of TNF and instead use TAF. He seems to have alwaysbeen suppressed on a protease inhibitor regimen and I see no evidence of prior resistance prior to starting PI based regimen. The barrier to R will be lower with GENVOYA and he MUST TAKE it with food. I am personally NOT convinced he has TNF toxicity but more likely prerenal failure          Paulette Blanch Dam 10/04/2014, 4:16 PM

## 2014-10-04 NOTE — Progress Notes (Signed)
Family Medicine Teaching Service Daily Progress Note Intern Pager: [phone]  Patient name: Phillip Gill Medical record number: [id number] Date of birth: 1976-03-09 Age: 39 y.o. Gender: male  Primary Care Provider: Myra Rude, MD Consultants: None  Code Status: Full   ABX/Culture Doxycycline 4/2 >> pen G 2.4 million units IM x 1   Blood cx 4/2  Urine cx 4/2  Assessment and Plan: 39 y.o. male presenting with fever. PMH is significant for HIV, HTN, anxiety, Hep b infection, tobacco dependence, and fibromyalgia.   #Fever: Febrile this am to 102.9 and last night to 103.1 - Lactic Acid 3.98 >0.52 - UA: trace leukocytes, trace hemoglobin, few bacteria - Flu swab: neg   - Urine culture: Staphylococcus species, 50,000 colonies/mL - Stool culture pending - RPR : pending, nonreactive in 08/30/14 - HSV culture : pending  - s/p doxycycline 100 mg BID (4/2>4/3) and pen G 2.4 million units IM x 1  (4/2) - Blood cx pending  - Infectious Disease consulted. Appreciate recommendations.  - Most likely due to herpes simplex or primary syphilis, chancroid in Ddx  - Initiated Valacyclovir  BID (4/3>>)  #AKI/ATN: FeNA indicates a prerenal process. Repleting with fluids. Creatinine 2.20 at admission. - Creatinine today pending - fluids 75 mL/hr,  - encourage PO   #HIV: Home medications include Truvada and Prezcobix - Holding antiretroviral medication for now due to renal insufficiency per ID. Considering Genvoya  #HTN: controlled. 120s/70s - holding lisinopril in setting of AKI  - continue metoprolol   # Hypokalemia: Potassium 3.1 at admission - Potassium 3.3 today - Replete with Kdur  FEN/GI: NS 75 mL/hr/ Heart healthy  Prophylaxis: hep subq  Disposition: Discharge today  Subjective:  Feeling well today. Continues to have fevers, but states medications help with that. Denies pain over penile lesion. Denies problems with urination.  Initially presented with  chief complaint of sore on penis for 2 weeks and high fever x2 days.  Objective: Temp:  [98.3 F (36.8 C)-103.2 F (39.6 C)] 102.9 F (39.4 C) (04/04 0545) Pulse Rate:  [96-100] 96 (04/04 0545) Resp:  [16-18] 18 (04/04 0545) BP: (125-128)/(77) 127/77 mmHg (04/04 0545) SpO2:  [96 %-99 %] 96 % (04/04 0545) Physical Exam: General: NAD, cooperative with exam.  Cardiovascular: RRR, S1S2, no MRG  Respiratory: CTAB, normal effort, no wheezing, crackles  Extremities: warm and well perfused, pulses intact  GU: ulcerated lesion on shaft of penis, right sided inguinal LAD, erythema improved this morning   Skin: no rashes on feet or hands,  Neuro: Alert and oriented, no gross deficits   Laboratory:  Recent Labs Lab 10/02/14 0504 10/03/14 0835  WBC 12.0* 11.7*  HGB 13.7 12.9*  HCT 41.9 39.7  PLT 149* 103*    Recent Labs Lab 10/02/14 0504 10/03/14 0835  NA 134* 135  K 3.1* 4.1  CL 102 108  CO2 24 24  BUN 11 7  CREATININE 2.20* 1.50*  CALCIUM 8.7 8.2*  PROT 7.7  --   BILITOT 0.4  --   ALKPHOS 77  --   ALT 11  --   AST 31  --   GLUCOSE 223* 102*  - Lactic Acid 3.98>0.52  Imaging/Diagnostic Tests: Dg Chest 2 View  10/02/2014   CLINICAL DATA:  Fever and cough for 1 day.  Initial encounter.  EXAM: CHEST  2 VIEW  COMPARISON:  Chest radiograph performed 03/16/2014  FINDINGS: The lungs are well-aerated and clear. There is no evidence of focal opacification, pleural effusion or  pneumothorax.  The heart is normal in size; the mediastinal contour is within normal limits. No acute osseous abnormalities are seen.  IMPRESSION: No acute cardiopulmonary process seen.   Electronically Signed   By: Roanna RaiderJeffery  Chang M.D.   On: 10/02/2014 05:44   Araceli Bouchealeigh N Rumley, DO 10/04/2014, 6:41 AM PGY-1, [place] [facility] FPTS Intern pager: [phone], text pages welcome

## 2014-10-04 NOTE — Progress Notes (Signed)
UR completed 

## 2014-10-05 ENCOUNTER — Encounter (HOSPITAL_COMMUNITY): Payer: Self-pay | Admitting: Radiology

## 2014-10-05 ENCOUNTER — Observation Stay (HOSPITAL_COMMUNITY): Payer: No Typology Code available for payment source

## 2014-10-05 DIAGNOSIS — N485 Ulcer of penis: Secondary | ICD-10-CM

## 2014-10-05 DIAGNOSIS — D696 Thrombocytopenia, unspecified: Secondary | ICD-10-CM | POA: Insufficient documentation

## 2014-10-05 DIAGNOSIS — D72829 Elevated white blood cell count, unspecified: Secondary | ICD-10-CM | POA: Insufficient documentation

## 2014-10-05 DIAGNOSIS — R509 Fever, unspecified: Secondary | ICD-10-CM

## 2014-10-05 DIAGNOSIS — N289 Disorder of kidney and ureter, unspecified: Secondary | ICD-10-CM

## 2014-10-05 DIAGNOSIS — R599 Enlarged lymph nodes, unspecified: Secondary | ICD-10-CM

## 2014-10-05 DIAGNOSIS — T148XXA Other injury of unspecified body region, initial encounter: Secondary | ICD-10-CM | POA: Insufficient documentation

## 2014-10-05 DIAGNOSIS — B181 Chronic viral hepatitis B without delta-agent: Secondary | ICD-10-CM

## 2014-10-05 DIAGNOSIS — R197 Diarrhea, unspecified: Secondary | ICD-10-CM

## 2014-10-05 LAB — GC/CHLAMYDIA PROBE AMP (~~LOC~~) NOT AT ARMC
Chlamydia: POSITIVE — AB
Neisseria Gonorrhea: NEGATIVE

## 2014-10-05 LAB — CBC
HCT: 35.6 % — ABNORMAL LOW (ref 39.0–52.0)
HEMOGLOBIN: 11.5 g/dL — AB (ref 13.0–17.0)
MCH: 29.3 pg (ref 26.0–34.0)
MCHC: 32.3 g/dL (ref 30.0–36.0)
MCV: 90.8 fL (ref 78.0–100.0)
PLATELETS: 88 10*3/uL — AB (ref 150–400)
RBC: 3.92 MIL/uL — AB (ref 4.22–5.81)
RDW: 13.7 % (ref 11.5–15.5)
WBC: 16.4 10*3/uL — ABNORMAL HIGH (ref 4.0–10.5)

## 2014-10-05 LAB — BASIC METABOLIC PANEL
Anion gap: 5 (ref 5–15)
BUN: 6 mg/dL (ref 6–23)
CALCIUM: 8.3 mg/dL — AB (ref 8.4–10.5)
CO2: 25 mmol/L (ref 19–32)
Chloride: 107 mmol/L (ref 96–112)
Creatinine, Ser: 1.22 mg/dL (ref 0.50–1.35)
GFR calc Af Amer: 86 mL/min — ABNORMAL LOW (ref >90)
GFR, EST NON AFRICAN AMERICAN: 74 mL/min — AB (ref >90)
GLUCOSE: 109 mg/dL — AB (ref 70–99)
POTASSIUM: 3.8 mmol/L (ref 3.5–5.1)
Sodium: 137 mmol/L (ref 135–145)

## 2014-10-05 MED ORDER — AZITHROMYCIN 500 MG PO TABS
1000.0000 mg | ORAL_TABLET | Freq: Every day | ORAL | Status: DC
  Filled 2014-10-05: qty 2

## 2014-10-05 MED ORDER — IOHEXOL 300 MG/ML  SOLN
25.0000 mL | INTRAMUSCULAR | Status: AC
  Administered 2014-10-05 (×2): 25 mL via ORAL

## 2014-10-05 MED ORDER — CEFTRIAXONE SODIUM IN DEXTROSE 20 MG/ML IV SOLN: 1.0000 g | INTRAVENOUS | Status: DC

## 2014-10-05 MED ORDER — CEFTRIAXONE SODIUM 250 MG IJ SOLR
250.0000 mg | Freq: Once | INTRAMUSCULAR | Status: DC
  Filled 2014-10-05: qty 250

## 2014-10-05 MED ORDER — DOXYCYCLINE HYCLATE 100 MG PO TABS
100.0000 mg | ORAL_TABLET | Freq: Two times a day (BID) | ORAL | Status: DC
  Administered 2014-10-05 – 2014-10-07 (×5): 100 mg via ORAL
  Filled 2014-10-05 (×7): qty 1

## 2014-10-05 MED ORDER — IOHEXOL 300 MG/ML  SOLN
100.0000 mL | Freq: Once | INTRAMUSCULAR | Status: AC | PRN
  Administered 2014-10-05: 100 mL via INTRAVENOUS

## 2014-10-05 MED ORDER — AZITHROMYCIN 500 MG PO TABS
1000.0000 mg | ORAL_TABLET | Freq: Once | ORAL | Status: DC
  Filled 2014-10-05: qty 2

## 2014-10-05 NOTE — Progress Notes (Signed)
*  Preliminary Results* Left upper extremity venous duplex completed. Left upper extremity is negative for deep and superficial vein thrombosis.  10/05/2014 12:54 PM  Gertie FeyMichelle Arnetha Silverthorne, RVT, RDCS, RDMS

## 2014-10-05 NOTE — Progress Notes (Signed)
UR completed 

## 2014-10-05 NOTE — Progress Notes (Addendum)
[name] admitted to Dr. Daiva EvesVan Dam that the health department contacted him and told him that he had been exposed to something "like syphylis" and would be coming to talk to him about his exposure. [facility] and they stated that [name] is most likely referring [facility] and that they are usually involved for cases of exposure to HIV and Syphilis. [place] [facility] contacted concerning exposure [phone] and stated they would look into their records and contact the physician on call for [facility] with the needed information.  Dr. Caroleen Hammanumley  Addendum: Contacted by Health Department. [name] is noted to have had a positive RPR on 10/02/14 prior to admission.Was given PCN at admission to hospital.

## 2014-10-05 NOTE — Progress Notes (Signed)
Family Medicine Teaching Service Daily Progress Note Intern Pager: [phone]  Patient name: Marilynne HalstedShanna M Droz Medical record number: [id number] Date of birth: September 29, 1975 Age: [age] y.o. Gender: male  Primary Care Provider: Myra RudeSchmitz, Jeremy E, MD Consultants: None  Code Status: Full   Assessment and Plan: 39 y.o. male presenting with fever. PMH is significant for HIV, HTN, anxiety, Hep b infection, tobacco dependence, and fibromyalgia.   #Fever: Ulcerated lesion noted on penis. 10x12cm erythematous and warm area noted on left upper extremity. Diarrhea noted. - Febrile to 101.9 this am, up to 103.1 yesterday - Lactic Acid 3.98 >0.52 - UA: trace leukocytes, trace hemoglobin, few bacteria - Flu swab: negative   - Urine culture: Staphylococcus species, 50,000 colonies/mL - Stool culture pending - Blood cx pending  - RPR : pending, nonreactive in 08/30/14 - HSV culture: negative  - GC/Chlamydia pending - Giardia/Cryptosporidium Screen - s/p doxycycline 100 mg BID (4/2>4/3) and pen G 2.4 million units IM x 1  (4/2) - Infectious Disease consulted. Appreciate recommendations.  - Initiated Valacyclovir 500mg  BID (4/3>>) - Tylenol scheduled  # Cellulitis- 10x12cm erythematous and warm area noted on left upper extremity - Doxycycline 100mg  BID - Continue to monitor  #AKI/ATN: FeNA indicates a prerenal process. Repleting with fluids. Creatinine 2.20 at admission. - Creatinine today pending - NS @ 75 mL/hr - Encourage PO   #HIV: Home medications include Truvada and Prezcobix - Genvoya (elvitegravir-cobicistat-emtricitabine-tenofovir) 150-150-200-10mg  - HIV RNA quant pending. 43 on 08/30/14  #HTN: Controlled.  - Holding lisinopril in setting of AKI  - Continue metoprolol 25mg  daily  # Hypokalemia: Potassium 3.1 at admission - Potassium pending - Replete with Kdur  FEN/GI: NS 75 mL/hr/ Heart healthy  Prophylaxis: hep subq  Disposition: Discharge today  Subjective:  States he  is feeling well today. Anxious for discharge. Continues to have occasional fevers. Excited about his new HIV medication that combines multiple medications into one pill.  Initially presented with chief complaint of sore on penis for 2 weeks and high fever x2 days.  Objective: Temp:  [99 F (37.2 C)-103.1 F (39.5 C)] 101.9 F (38.8 C) (04/05 0528) Pulse Rate:  [92-98] 92 (04/05 0528) Resp:  [16-20] 20 (04/05 0528) BP: (128-134)/(74-82) 132/80 mmHg (04/05 0528) SpO2:  [97 %-100 %] 97 % (04/05 0528) Physical Exam: General: NAD, cooperative with exam.  Cardiovascular: RRR, S1S2, no MRG  Respiratory: CTAB, normal effort, no wheezing, crackles  Extremities: warm and well perfused, pulses intact  GU: ulcerated lesion on shaft of penis, right sided inguinal LAD improved from yesterday   Skin: 10x12cm erythemamatous and warm area noted on left upper extremity Neuro: Alert and oriented, no gross deficits   Laboratory:  Recent Labs Lab 10/02/14 0504 10/03/14 0835 10/04/14 0734  WBC 12.0* 11.7* 12.2*  HGB 13.7 12.9* 11.5*  HCT 41.9 39.7 35.4*  PLT 149* 103* 93*    Recent Labs Lab 10/02/14 0504 10/03/14 0835 10/04/14 0734  NA 134* 135 136  K 3.1* 4.1 3.3*  CL 102 108 108  CO2 24 24 25   BUN 11 7 8   CREATININE 2.20* 1.50* 1.43*  CALCIUM 8.7 8.2* 8.1*  PROT 7.7  --   --   BILITOT 0.4  --   --   ALKPHOS 77  --   --   ALT 11  --   --   AST 31  --   --   GLUCOSE 223* 102* 111*  - Lactic Acid 3.98>0.52 - Herpes culture negative - C. difficil negative  Imaging/Diagnostic Tests: Dg Chest 2 View  10/02/2014   CLINICAL DATA:  Fever and cough for 1 day.  Initial encounter.  EXAM: CHEST  2 VIEW  COMPARISON:  Chest radiograph performed 03/16/2014  FINDINGS: The lungs are well-aerated and clear. There is no evidence of focal opacification, pleural effusion or pneumothorax.  The heart is normal in size; the mediastinal contour is within normal limits. No acute osseous  abnormalities are seen.  IMPRESSION: No acute cardiopulmonary process seen.   Electronically Signed   By: Roanna Raider M.D.   On: 10/02/2014 05:44   Araceli Bouche, DO 10/05/2014, 7:14 AM PGY-1, [facility] FPTS Intern pager: [phone], text pages welcome

## 2014-10-05 NOTE — Progress Notes (Addendum)
[facility]         Subjective: Arm pain at site of his BP cuff   Antibiotics:  Anti-infectives    Start     Dose/Rate Route Frequency Ordered Stop   10/06/14 0800  cefTRIAXone (ROCEPHIN) 1 g in dextrose 5 % 50 mL IVPB - Premix  Status:  Discontinued     1 g 100 mL/hr over 30 Minutes Intravenous Every 24 hours 10/05/14 0041 10/05/14 1103   10/06/14 0800  azithromycin (ZITHROMAX) tablet 1,000 mg  Status:  Discontinued     1,000 mg Oral Daily 10/05/14 0041 10/05/14 1105   10/05/14 1200  cefTRIAXone (ROCEPHIN) injection 250 mg  Status:  Discontinued     250 mg Intramuscular  Once 10/05/14 1104 10/05/14 1153   10/05/14 1200  azithromycin (ZITHROMAX) tablet 1,000 mg  Status:  Discontinued     1,000 mg Oral  Once 10/05/14 1105 10/05/14 1153   10/05/14 1000  doxycycline (VIBRA-TABS) tablet 100 mg     100 mg Oral Every 12 hours 10/05/14 0804     10/05/14 0800  elvitegravir-cobicistat-emtricitabine-tenofovir (GENVOYA) 150-150-200-10 MG tablet 1 tablet     1 tablet Oral Daily with breakfast 10/04/14 1625     10/03/14 1430  valACYclovir (VALTREX) tablet 500 mg     500 mg Oral 2 times daily 10/03/14 1345     10/03/14 1000  penicillin g benzathine (BICILLIN LA) 1200000 UNIT/2ML injection 2.4 Million Units     2.4 Million Units Intramuscular  Once 10/03/14 0853 10/03/14 1058   10/02/14 2200  doxycycline (VIBRA-TABS) tablet 100 mg  Status:  Discontinued    Comments:  Please hold until after RPR is collected.   100 mg Oral Every 12 hours 10/02/14 1335 10/03/14 1345   10/02/14 1330  doxycycline (VIBRA-TABS) tablet 100 mg  Status:  Discontinued     100 mg Oral Every 12 hours 10/02/14 1300 10/02/14 1335   10/02/14 1100  darunavir-cobicistat (PREZCOBIX) 800-150 MG per tablet 1 tablet  Status:  Discontinued     1 tablet Oral Daily 10/02/14 1009 10/03/14 1345   10/02/14 1100  emtricitabine-tenofovir (TRUVADA) 200-300 MG per tablet 1 tablet  Status:   Discontinued     1 tablet Oral Daily 10/02/14 1009 10/03/14 1345      Medications: Scheduled Meds: . acetaminophen  650 mg Oral Q6H   Or  . acetaminophen  650 mg Rectal Q6H  . doxycycline  100 mg Oral Q12H  . elvitegravir-cobicistat-emtricitabine-tenofovir  1 tablet Oral Q breakfast  . heparin  5,000 Units Subcutaneous 3 times per day  . metoprolol succinate  25 mg Oral Daily  . valACYclovir  500 mg Oral BID   Continuous Infusions: . sodium chloride 75 mL/hr at 10/04/14 2234   PRN Meds:.ondansetron **OR** ondansetron (ZOFRAN) IV, senna-docusate    Objective: Weight change:   Intake/Output Summary (Last 24 hours) at 10/05/14 1154 Last data filed at 10/05/14 1002  Gross per 24 hour  Intake   1035 ml  Output      0 ml  Net   1035 ml   Blood pressure 132/80, pulse 92, temperature 101.9 F (38.8 C), temperature source Oral, resp. rate 20, height 6' (1.829 m), weight 180 lb (81.647 kg), SpO2 97 %. Temp:  [99 F (37.2 C)-103.1 F (39.5 C)] 101.9 F (38.8 C) (04/05 0528) Pulse Rate:  [92-98] 92 (04/05 0528) Resp:  [16-20] 20 (04/05 0528) BP: (128-134)/(74-82) 132/80 mmHg (04/05 0528)  SpO2:  [97 %-100 %] 97 % (04/05 0528)  Physical Exam: General: Alert and awake, oriented x3, not in any acute distress. HEENT: anicteric sclera,EOMI CVS regular rate, normal r,  no murmur rubs or gallops Chest: clear to auscultation bilaterally, no wheezing, rales or rhonchi Abdomen: soft  nondistended, normal bowel sounds, some tenderness in epigastrium  Skin: bruise on left arm  10/05/14:    Groin with inguinal LA on right and ulcer on penis see sequential pics:  10/04/14:    10/05/14       Lymph: no new lymphadenopathy Neuro: nonfocal  CBC: (wbc3,Hgb:3,Hct:3,Plt:3,INR:3APTT:3)@   BMET  Recent Labs  10/04/14 0734 10/05/14 0838  NA 136 137  K 3.3* 3.8  CL 108 107  CO2 25 25  GLUCOSE 111* 109*  BUN 8 6  CREATININE 1.43* 1.22  CALCIUM 8.1* 8.3*      Liver Panel  No results for input(s): PROT, ALBUMIN, AST, ALT, ALKPHOS, BILITOT, BILIDIR, IBILI in the last 72 hours.     Sedimentation Rate No results for input(s): ESRSEDRATE in the last 72 hours. C-Reactive Protein No results for input(s): CRP in the last 72 hours.  Micro Results: Recent Results (from the past 720 hour(s))  Blood culture (routine x 2)     Status: None (Preliminary result)   Collection Time: 10/02/14  4:55 AM  Result Value Ref Range Status   Specimen Description BLOOD LEFT ARM  Final   Special Requests BOTTLES DRAWN AEROBIC AND ANAEROBIC 10CC  Final   Culture   Final           BLOOD CULTURE RECEIVED NO GROWTH TO DATE CULTURE WILL BE HELD FOR 5 DAYS BEFORE ISSUING A FINAL NEGATIVE REPORT Performed at [facility]    Report Status PENDING  Incomplete  Blood culture (routine x 2)     Status: None (Preliminary result)   Collection Time: 10/02/14  5:01 AM  Result Value Ref Range Status   Specimen Description BLOOD RIGHT ARM  Final   Special Requests BOTTLES DRAWN AEROBIC ONLY 10CC  Final   Culture   Final           BLOOD CULTURE RECEIVED NO GROWTH TO DATE CULTURE WILL BE HELD FOR 5 DAYS BEFORE ISSUING A FINAL NEGATIVE REPORT Performed at [facility]    Report Status PENDING  Incomplete  Urine culture     Status: None   Collection Time: 10/02/14  8:12 AM  Result Value Ref Range Status   Specimen Description URINE, CLEAN CATCH  Final   Special Requests NONE  Final   Colony Count   Final    50,000 COLONIES/ML Performed at [facility]    Culture   Final    STAPHYLOCOCCUS SPECIES (COAGULASE NEGATIVE) Note: RIFAMPIN AND GENTAMICIN SHOULD NOT BE USED AS SINGLE DRUGS FOR TREATMENT OF STAPH INFECTIONS. Performed at [facility]    Report Status 10/04/2014 FINAL  Final   Organism ID, Bacteria STAPHYLOCOCCUS SPECIES (COAGULASE NEGATIVE)  Final      Susceptibility   Staphylococcus species (coagulase negative) - MIC*     GENTAMICIN 4 SENSITIVE Sensitive     LEVOFLOXACIN 4 INTERMEDIATE Intermediate     NITROFURANTOIN <=16 SENSITIVE Sensitive     OXACILLIN >=4 RESISTANT Resistant     PENICILLIN >=0.5 RESISTANT Resistant     RIFAMPIN <=0.5 SENSITIVE Sensitive     TRIMETH/SULFA 80 RESISTANT Resistant     VANCOMYCIN 2 SENSITIVE Sensitive     TETRACYCLINE >=16 RESISTANT  Resistant     * STAPHYLOCOCCUS SPECIES (COAGULASE NEGATIVE)  Herpes simplex virus culture     Status: None   Collection Time: 10/02/14  4:42 PM  Result Value Ref Range Status   Specimen Description PENIS  Final   Special Requests Immunocompromised  Final   Culture   Final    No Herpes Simplex Virus detected. Performed at [facility]    Report Status 10/04/2014 FINAL  Final  Stool culture     Status: None (Preliminary result)   Collection Time: 10/03/14  6:19 PM  Result Value Ref Range Status   Specimen Description STOOL  Final   Special Requests Normal  Final   Culture   Final    NO SUSPICIOUS COLONIES, CONTINUING TO HOLD Performed at [facility]    Report Status PENDING  Incomplete  Clostridium Difficile by PCR     Status: None   Collection Time: 10/03/14  6:20 PM  Result Value Ref Range Status   C difficile by pcr NEGATIVE NEGATIVE Final    Studies/Results: No results found.    Assessment/Plan:  Active Problems:   Fever   Essential hypertension   Human immunodeficiency virus (HIV) disease   Chronic hepatitis B   Penile ulcer   Inguinal adenopathy   Diarrhea   Pyrexia    Phillip Gill is a 39 y.o. male with  male with well controlled HIV and a Four-week history of painful penile ulcer more recent abrupt onset of loose stools with eating high fevers. He has received a dose of penicillin for presumptive possible syphilis as well as doxycycline for possible LGV and valtrex for possible HSV but is still persistently febrile. His HSV culture is negative his RPR negative (but no prozone checked that I  can tell)  He has plummeting TTPenia, rising WBC and persistent fevers  He tells me that someone is coming from the [facility] to talk to him about something that is "like syphilis" from a contact?  # 1 Genital ulcer: wonder if this could be ducreyi? The PCN and doxycycline are not listed as first or 2nd line drugs for H ducreyi so he has not yet had effective rx for it (though it is very rare in [place]) Perhaps [facility] person can help [place] or I can also try get ahold of Lamar Benes, MD.  I have held off giving azithromycin because I was wanting to give it with rocephin IM for possible rectal GC.  Very strange case. Could he have Behcet's? I did not look at his gums but if he has ulcers in his mouth he might indeed have this  May ultimately need biopsy if does not respond to therapy but that can wait for several weeks  #2 FUO: with high WBC and low platelets. I am worried he has some 2nd process besides his genital ulcer  I would recommend a CT chest , pelvis and abdomen for throughness  #3 HIV: continue Genvoya  #4 TTpenia: not on Heparin. Could this be due to severe infection? AI process Or to PCN he received IM? Again CT scan scan could be helpful ruling out 2nd process  #5 Diarrhea: is better. Stool cx neg, C diff PCR neg, Giardia crypto pending. He does not have rectal dc by hx but I was of mind to give hi rocephin and azithro in case he had rectal GC but am holding while we try to figure out what is going on with platelets etc  I spent greater  than 40 minutes with the patient including greater than 50% of time in face to face counsel of the patient and in coordination of their care.     Paulette Blanch Dam 10/05/2014, 11:54 AM  ADDENDUM:   HIS URINE CAME BACK POSITIVE FOR CHLAMYDIA  I THINK HIS ULCER AND INGUINAL LA ARE ALL LIKELY LGV WHICH CAN PRESENT WITH HIGH FEVERS though I have not seen them persist for this long and with such other abnormalities such as ttpenia and leukocytosis  THINK  WE SHOULD STILL CONSIDER CT CHEST ABD AND PELVIS TO ENSURE NO OTHER OCCULT PROCESS DRIVING HIS PATHOLOGY

## 2014-10-06 ENCOUNTER — Inpatient Hospital Stay (HOSPITAL_COMMUNITY): Payer: No Typology Code available for payment source

## 2014-10-06 DIAGNOSIS — D72829 Elevated white blood cell count, unspecified: Secondary | ICD-10-CM

## 2014-10-06 DIAGNOSIS — L03114 Cellulitis of left upper limb: Secondary | ICD-10-CM | POA: Insufficient documentation

## 2014-10-06 DIAGNOSIS — A510 Primary genital syphilis: Secondary | ICD-10-CM | POA: Insufficient documentation

## 2014-10-06 DIAGNOSIS — A55 Chlamydial lymphogranuloma (venereum): Secondary | ICD-10-CM | POA: Insufficient documentation

## 2014-10-06 DIAGNOSIS — T148 Other injury of unspecified body region: Secondary | ICD-10-CM

## 2014-10-06 DIAGNOSIS — D696 Thrombocytopenia, unspecified: Secondary | ICD-10-CM

## 2014-10-06 DIAGNOSIS — B20 Human immunodeficiency virus [HIV] disease: Secondary | ICD-10-CM | POA: Insufficient documentation

## 2014-10-06 LAB — CBC
HCT: 35.7 % — ABNORMAL LOW (ref 39.0–52.0)
Hemoglobin: 11.8 g/dL — ABNORMAL LOW (ref 13.0–17.0)
MCH: 29.6 pg (ref 26.0–34.0)
MCHC: 33.1 g/dL (ref 30.0–36.0)
MCV: 89.5 fL (ref 78.0–100.0)
Platelets: 111 10*3/uL — ABNORMAL LOW (ref 150–400)
RBC: 3.99 MIL/uL — AB (ref 4.22–5.81)
RDW: 14 % (ref 11.5–15.5)
WBC: 16.1 10*3/uL — ABNORMAL HIGH (ref 4.0–10.5)

## 2014-10-06 LAB — BASIC METABOLIC PANEL
Anion gap: 8 (ref 5–15)
BUN: <5 mg/dL — ABNORMAL LOW (ref 6–23)
CALCIUM: 8.8 mg/dL (ref 8.4–10.5)
CO2: 23 mmol/L (ref 19–32)
CREATININE: 0.98 mg/dL (ref 0.50–1.35)
Chloride: 107 mmol/L (ref 96–112)
GFR calc Af Amer: >90 mL/min (ref >90)
GFR calc non Af Amer: >90 mL/min (ref >90)
GLUCOSE: 95 mg/dL (ref 70–99)
Potassium: 4 mmol/L (ref 3.5–5.1)
Sodium: 138 mmol/L (ref 135–145)

## 2014-10-06 LAB — HEPATITIS PANEL, ACUTE
HCV Ab: NEGATIVE
Hep A IgM: NONREACTIVE
Hep B C IgM: NONREACTIVE
Hepatitis B Surface Ag: POSITIVE — AB

## 2014-10-06 LAB — HEPATITIS B SURF AG CONFIRMATION: Hepatitis B Surf Ag Confirmation: POSITIVE — AB

## 2014-10-06 LAB — T-HELPER CELLS (CD4) COUNT (NOT AT ARMC)
CD4 T CELL ABS: 760 /uL (ref 400–2700)
CD4 T CELL HELPER: 30 % — AB (ref 33–55)

## 2014-10-06 LAB — RPR: RPR: REACTIVE — AB

## 2014-10-06 LAB — RPR, QUANT+TP ABS (REFLEX): TREPONEMA PALLIDUM AB: POSITIVE — AB

## 2014-10-06 MED ORDER — ACETAMINOPHEN 650 MG RE SUPP: 650.0000 mg | Freq: Four times a day (QID) | RECTAL | Status: DC | PRN

## 2014-10-06 MED ORDER — LISINOPRIL 20 MG PO TABS
20.0000 mg | ORAL_TABLET | Freq: Every day | ORAL | Status: DC
  Administered 2014-10-06 – 2014-10-07 (×2): 20 mg via ORAL
  Filled 2014-10-06 (×3): qty 1

## 2014-10-06 MED ORDER — CEFTRIAXONE SODIUM IN DEXTROSE 20 MG/ML IV SOLN
1.0000 g | INTRAVENOUS | Status: DC
  Administered 2014-10-06: 1 g via INTRAVENOUS
  Filled 2014-10-06: qty 50

## 2014-10-06 MED ORDER — GADOBENATE DIMEGLUMINE 529 MG/ML IV SOLN
18.0000 mL | Freq: Once | INTRAVENOUS | Status: AC | PRN
  Administered 2014-10-06: 18 mL via INTRAVENOUS

## 2014-10-06 MED ORDER — ACETAMINOPHEN 325 MG PO TABS: 650.0000 mg | ORAL_TABLET | Freq: Four times a day (QID) | ORAL | Status: DC | PRN

## 2014-10-06 NOTE — Progress Notes (Signed)
[facility]     Subjective: Arm pain at site of his BP cuff with expanding hyperpigmented area   Antibiotics:  Anti-infectives    Start     Dose/Rate Route Frequency Ordered Stop   10/06/14 1200  cefTRIAXone (ROCEPHIN) 1 g in dextrose 5 % 50 mL IVPB - Premix     1 g 100 mL/hr over 30 Minutes Intravenous Every 24 hours 10/06/14 1126     10/06/14 0800  cefTRIAXone (ROCEPHIN) 1 g in dextrose 5 % 50 mL IVPB - Premix  Status:  Discontinued     1 g 100 mL/hr over 30 Minutes Intravenous Every 24 hours 10/05/14 0041 10/05/14 1103   10/06/14 0800  azithromycin (ZITHROMAX) tablet 1,000 mg  Status:  Discontinued     1,000 mg Oral Daily 10/05/14 0041 10/05/14 1105   10/05/14 1200  cefTRIAXone (ROCEPHIN) injection 250 mg  Status:  Discontinued     250 mg Intramuscular  Once 10/05/14 1104 10/05/14 1153   10/05/14 1200  azithromycin (ZITHROMAX) tablet 1,000 mg  Status:  Discontinued     1,000 mg Oral  Once 10/05/14 1105 10/05/14 1153   10/05/14 1000  doxycycline (VIBRA-TABS) tablet 100 mg     100 mg Oral Every 12 hours 10/05/14 0804     10/05/14 0800  elvitegravir-cobicistat-emtricitabine-tenofovir (GENVOYA) 150-150-200-10 MG tablet 1 tablet     1 tablet Oral Daily with breakfast 10/04/14 1625     10/03/14 1430  valACYclovir (VALTREX) tablet 500 mg  Status:  Discontinued     500 mg Oral 2 times daily 10/03/14 1345 10/06/14 1136   10/03/14 1000  penicillin g benzathine (BICILLIN LA) 1200000 UNIT/2ML injection 2.4 Million Units     2.4 Million Units Intramuscular  Once 10/03/14 0853 10/03/14 1058   10/02/14 2200  doxycycline (VIBRA-TABS) tablet 100 mg  Status:  Discontinued    Comments:  Please hold until after RPR is collected.   100 mg Oral Every 12 hours 10/02/14 1335 10/03/14 1345   10/02/14 1330  doxycycline (VIBRA-TABS) tablet 100 mg  Status:  Discontinued     100 mg Oral Every 12 hours 10/02/14 1300 10/02/14 1335   10/02/14 1100  darunavir-cobicistat  (PREZCOBIX) 800-150 MG per tablet 1 tablet  Status:  Discontinued     1 tablet Oral Daily 10/02/14 1009 10/03/14 1345   10/02/14 1100  emtricitabine-tenofovir (TRUVADA) 200-300 MG per tablet 1 tablet  Status:  Discontinued     1 tablet Oral Daily 10/02/14 1009 10/03/14 1345      Medications: Scheduled Meds: . cefTRIAXone (ROCEPHIN)  IV  1 g Intravenous Q24H  . doxycycline  100 mg Oral Q12H  . elvitegravir-cobicistat-emtricitabine-tenofovir  1 tablet Oral Q breakfast  . heparin  5,000 Units Subcutaneous 3 times per day  . lisinopril  20 mg Oral Daily  . metoprolol succinate  25 mg Oral Daily   Continuous Infusions: . sodium chloride 75 mL/hr at 10/04/14 2234   PRN Meds:.acetaminophen **OR** acetaminophen, ondansetron **OR** ondansetron (ZOFRAN) IV, senna-docusate    Objective: Weight change:   Intake/Output Summary (Last 24 hours) at 10/06/14 2044 Last data filed at 10/06/14 1800  Gross per 24 hour  Intake   2405 ml  Output   2565 ml  Net   -160 ml   Blood pressure 124/86, pulse 73, temperature 98.1 F (36.7 C), temperature source Oral, resp. rate 17, height 6' (1.829 m), weight 180 lb (81.647 kg), SpO2 100 %. Temp:  [  97.3 F (36.3 C)-98.2 F (36.8 C)] 98.1 F (36.7 C) (04/06 1559) Pulse Rate:  [70-85] 73 (04/06 1559) Resp:  [17-18] 17 (04/06 1559) BP: (124-136)/(73-86) 124/86 mmHg (04/06 1559) SpO2:  [100 %] 100 % (04/06 1559)  Physical Exam: General: Alert and awake, oriented x3, not in any acute distress. HEENT: anicteric sclera,EOMI CVS regular rate, normal r,  no murmur rubs or gallops Chest: clear to auscultation bilaterally, no wheezing, rales or rhonchi Abdomen: soft  nondistended, normal bowel sounds, some tenderness in epigastrium  Skin: bruise on left arm  10/05/14:     10/06/14:     Groin with inguinal LA on right and ulcer on penis see sequential pics:  10/04/14:    10/05/14       Lymph: no new lymphadenopathy Neuro:  nonfocal  CBC: @LABBLAST3 (wbc3,Hgb:3,Hct:3,Plt:3,INR:3APTT:3)@   BMET  Recent Labs  10/05/14 0838 10/06/14 0610  NA 137 138  K 3.8 4.0  CL 107 107  CO2 25 23  GLUCOSE 109* 95  BUN 6 <5*  CREATININE 1.22 0.98  CALCIUM 8.3* 8.8     Liver Panel  No results for input(s): PROT, ALBUMIN, AST, ALT, ALKPHOS, BILITOT, BILIDIR, IBILI in the last 72 hours.     Sedimentation Rate No results for input(s): ESRSEDRATE in the last 72 hours. C-Reactive Protein No results for input(s): CRP in the last 72 hours.  Micro Results: Recent Results (from the past 720 hour(s))  Blood culture (routine x 2)     Status: None (Preliminary result)   Collection Time: 10/02/14  4:55 AM  Result Value Ref Range Status   Specimen Description BLOOD LEFT ARM  Final   Special Requests BOTTLES DRAWN AEROBIC AND ANAEROBIC 10CC  Final   Culture   Final           BLOOD CULTURE RECEIVED NO GROWTH TO DATE CULTURE WILL BE HELD FOR 5 DAYS BEFORE ISSUING A FINAL NEGATIVE REPORT Performed at [facility]    Report Status PENDING  Incomplete  Blood culture (routine x 2)     Status: None (Preliminary result)   Collection Time: 10/02/14  5:01 AM  Result Value Ref Range Status   Specimen Description BLOOD RIGHT ARM  Final   Special Requests BOTTLES DRAWN AEROBIC ONLY 10CC  Final   Culture   Final           BLOOD CULTURE RECEIVED NO GROWTH TO DATE CULTURE WILL BE HELD FOR 5 DAYS BEFORE ISSUING A FINAL NEGATIVE REPORT Performed at [facility]    Report Status PENDING  Incomplete  Urine culture     Status: None   Collection Time: 10/02/14  8:12 AM  Result Value Ref Range Status   Specimen Description URINE, CLEAN CATCH  Final   Special Requests NONE  Final   Colony Count   Final    50,000 COLONIES/ML Performed at [facility]    Culture   Final    STAPHYLOCOCCUS SPECIES (COAGULASE NEGATIVE) Note: RIFAMPIN AND GENTAMICIN SHOULD NOT BE USED AS SINGLE DRUGS FOR TREATMENT OF STAPH  INFECTIONS. Performed at [facility]    Report Status 10/04/2014 FINAL  Final   Organism ID, Bacteria STAPHYLOCOCCUS SPECIES (COAGULASE NEGATIVE)  Final      Susceptibility   Staphylococcus species (coagulase negative) - MIC*    GENTAMICIN 4 SENSITIVE Sensitive     LEVOFLOXACIN 4 INTERMEDIATE Intermediate     NITROFURANTOIN <=16 SENSITIVE Sensitive     OXACILLIN >=4 RESISTANT Resistant  PENICILLIN >=0.5 RESISTANT Resistant     RIFAMPIN <=0.5 SENSITIVE Sensitive     TRIMETH/SULFA 80 RESISTANT Resistant     VANCOMYCIN 2 SENSITIVE Sensitive     TETRACYCLINE >=16 RESISTANT Resistant     * STAPHYLOCOCCUS SPECIES (COAGULASE NEGATIVE)  Herpes simplex virus culture     Status: None   Collection Time: 10/02/14  4:42 PM  Result Value Ref Range Status   Specimen Description PENIS  Final   Special Requests Immunocompromised  Final   Culture   Final    No Herpes Simplex Virus detected. Performed at [facility]    Report Status 10/04/2014 FINAL  Final  Stool culture     Status: None (Preliminary result)   Collection Time: 10/03/14  6:19 PM  Result Value Ref Range Status   Specimen Description STOOL  Final   Special Requests Normal  Final   Culture   Final    NO SUSPICIOUS COLONIES, CONTINUING TO HOLD Performed at [facility]    Report Status PENDING  Incomplete  Clostridium Difficile by PCR     Status: None   Collection Time: 10/03/14  6:20 PM  Result Value Ref Range Status   C difficile by pcr NEGATIVE NEGATIVE Final    Studies/Results: Ct Chest W Contrast  10/05/2014   CLINICAL DATA:  Initial encounter.  HIV.  Fever of unknown origin.  EXAM: CT CHEST, ABDOMEN, AND PELVIS WITH CONTRAST  TECHNIQUE: Multidetector CT imaging of the chest, abdomen and pelvis was performed following the standard protocol during bolus administration of intravenous contrast.  CONTRAST:  OMNIPAQUE IOHEXOL 300 MG/ML  SOLN  COMPARISON:  03/26/2014  FINDINGS: CT CHEST FINDINGS   Musculoskeletal: Normal.  Lungs:  Dependent atelectasis.  No airspace disease.  Central airways: Patent.  Vasculature: Normal.  Effusions: Tiny amount of anterior pericardial fluid or thickening. No pleural effusion.  Lymphadenopathy: There is no axillary adenopathy. No mediastinal or hilar adenopathy.  Esophagus: Normal.  Upper abdomen: See below.  Other: None.  CT ABDOMEN AND PELVIS FINDINGS  Musculoskeletal: Mild bilateral sacroiliac joint osteoarthritis with vacuum joint and sclerosis greater on the LEFT than RIGHT.  Lung Bases: See above.  Liver:  Normal.  Spleen:  Normal.  Gallbladder:  Contracted.  No calcified stones.  Common bile duct:  Normal.  Pancreas:  Normal.  Adrenal glands:  Normal bilaterally.  Kidneys: Tiny bilateral low-density lesions likely representing renal cortical cysts. These are too small to characterize. Enhancement is within normal limits. No calculi. No hydronephrosis. LEFT ureter appears normal. RIGHT ureter appears normal.  Stomach:  Normal.  Small bowel:  Normal.  No mesenteric adenopathy.  Colon: Normal appendix. Colon is opacified with ingested oral contrast. Redundant sigmoid colon.  Pelvic Genitourinary: Normal urinary bladder. Prostate gland appears normal.  Peritoneum: No free air or free fluid.  Vasculature: Mild atherosclerosis.  Body Wall: Negative for hernia.  Lymphatic system: There is RIGHT inguinal adenopathy. The largest RIGHT inguinal node measures 19 mm x 24 mm. Inflammatory changes are present in the RIGHT inguinal region. Stranding in the subcutaneous fat adjacent to the lymphadenopathy. The lymphadenopathy extends into the anatomic pelvis with enlarged RIGHT iliac chain lymph nodes. The largest node measures 20 mm x 31 mm (image 117 series 2). The contralateral iliac nodal chain appears normal. No retroaortic adenopathy.  IMPRESSION: RIGHT inguinal and RIGHT iliac adenopathy. Inflamed RIGHT inguinal nodes are present with subcutaneous fat stranding. Although the  appearance of the nodes is nonspecific, the unilateral involvement involving the  RIGHT inguinal region and RIGHT iliac nodal chain suggests a RIGHT lower extremity inflammatory or infectious process. Other considerations include lymphoma or other lymphoproliferative disorder.   Electronically Signed   By: Andreas NewportGeoffrey  Lamke M.D.   On: 10/05/2014 18:33   Ct Abdomen Pelvis W Contrast  10/05/2014   CLINICAL DATA:  Initial encounter.  HIV.  Fever of unknown origin.  EXAM: CT CHEST, ABDOMEN, AND PELVIS WITH CONTRAST  TECHNIQUE: Multidetector CT imaging of the chest, abdomen and pelvis was performed following the standard protocol during bolus administration of intravenous contrast.  CONTRAST:  100mL OMNIPAQUE IOHEXOL 300 MG/ML  SOLN  COMPARISON:  03/26/2014  FINDINGS: CT CHEST FINDINGS  Musculoskeletal: Normal.  Lungs:  Dependent atelectasis.  No airspace disease.  Central airways: Patent.  Vasculature: Normal.  Effusions: Tiny amount of anterior pericardial fluid or thickening. No pleural effusion.  Lymphadenopathy: There is no axillary adenopathy. No mediastinal or hilar adenopathy.  Esophagus: Normal.  Upper abdomen: See below.  Other: None.  CT ABDOMEN AND PELVIS FINDINGS  Musculoskeletal: Mild bilateral sacroiliac joint osteoarthritis with vacuum joint and sclerosis greater on the LEFT than RIGHT.  Lung Bases: See above.  Liver:  Normal.  Spleen:  Normal.  Gallbladder:  Contracted.  No calcified stones.  Common bile duct:  Normal.  Pancreas:  Normal.  Adrenal glands:  Normal bilaterally.  Kidneys: Tiny bilateral low-density lesions likely representing renal cortical cysts. These are too small to characterize. Enhancement is within normal limits. No calculi. No hydronephrosis. LEFT ureter appears normal. RIGHT ureter appears normal.  Stomach:  Normal.  Small bowel:  Normal.  No mesenteric adenopathy.  Colon: Normal appendix. Colon is opacified with ingested oral contrast. Redundant sigmoid colon.  Pelvic  Genitourinary: Normal urinary bladder. Prostate gland appears normal.  Peritoneum: No free air or free fluid.  Vasculature: Mild atherosclerosis.  Body Wall: Negative for hernia.  Lymphatic system: There is RIGHT inguinal adenopathy. The largest RIGHT inguinal node measures 19 mm x 24 mm. Inflammatory changes are present in the RIGHT inguinal region. Stranding in the subcutaneous fat adjacent to the lymphadenopathy. The lymphadenopathy extends into the anatomic pelvis with enlarged RIGHT iliac chain lymph nodes. The largest node measures 20 mm x 31 mm (image 117 series 2). The contralateral iliac nodal chain appears normal. No retroaortic adenopathy.  IMPRESSION: RIGHT inguinal and RIGHT iliac adenopathy. Inflamed RIGHT inguinal nodes are present with subcutaneous fat stranding. Although the appearance of the nodes is nonspecific, the unilateral involvement involving the RIGHT inguinal region and RIGHT iliac nodal chain suggests a RIGHT lower extremity inflammatory or infectious process. Other considerations include lymphoma or other lymphoproliferative disorder.   Electronically Signed   By: Andreas NewportGeoffrey  Lamke M.D.   On: 10/05/2014 18:33      Assessment/Plan:  Active Problems:   Fever   Essential hypertension   Human immunodeficiency virus (HIV) disease   Chronic hepatitis B   Penile ulcer   Inguinal adenopathy   Diarrhea   Pyrexia   Thrombocytopenia   Leukocytosis   FUO (fever of unknown origin)   Bruise    Phillip Gill is a 39 y.o. male with  male with well controlled HIV and a Four-week history of painful penile ulcer more recent abrupt onset of loose stools with eating high fevers. He has received a dose of penicillin for presumptive possible syphilis as well as doxycycline for possible LGV and valtrex for possible HSV but is still persistently febrile. 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    # 1 Genital ulcer + LA:  Could be due to LGV +/- syphilis (he has been treated for syphilis and now improving on doxycyline. I suspect his TTpenia and leukocytosis were due to severe LGV, fevers have resolved  WOULD CALL LAB TO MAKE SURE THAT WE HAVE HIS REPORTEDLY + RPR CROSS OVER AND LET EPIC AND LAB KNOW OF THIS APPARENT FAILURE OF LAB TO CROSS OVER  #2 Bruise +/- cellulitis: I doubt this is actually cellulitis. He is already covered for strep species with the IM PCN he received and is long acting (there is no ned for rocephin) and the doxycycline would cover for MRSA  I would dc his rocephin  I understand MRI is being done tonight  #3 HIV: continue Genvoya he is tolerating very  well  #4 TTpenia: presumed due to severe LGV but would watch for thsi with future beta lactams as possible drug effect  #5 Diarrhea: is better. Stool cx neg, C diff PCR neg, Giardia crypto pending.  #6 LGV: would give 21 days of doxycyline  If he is still doing well can dc to home tomorrow for followup in our clinic  He will need rx for his Genvoya sent in along with doxy for his LGV  I will sign off for now.  Dr Ninetta Lights is available for questions over the next few days.     LOS: 0 days   Acey Lav 10/06/2014, 8:44 PM

## 2014-10-06 NOTE — Progress Notes (Signed)
Family Medicine Teaching Service Daily Progress Note Intern Pager: [phone]  Patient name: Phillip Gill Medical record number: [id number] Date of birth: January 11, 1976 Age: 39 y.o. Gender: male  Primary Care Provider: Myra Rude, MD Consultants: None  Code Status: Full   Assessment and Plan: 39 y.o. male presenting with fever. PMH is significant for HIV, HTN, anxiety, Hep b infection, tobacco dependence, and fibromyalgia.   #Fever: Ulcerated lesion noted on penis. 10x12cm erythematous and warm area noted on left upper extremity. Diarrhea noted--improving. 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 - RPR : initial reactive and sent for further analysis before final report can be read, nonreactive in 08/30/14  - [facility] and reports positive RPR beginning of April prior to admission - HSV culture: negative  - Chlamydia positive, Gonorrhea negative - CT: right inguinal and iliac adenopathy, inflamed right inguinal nodes with subcutaneous fat stranding, indicative of right lower extremity inflammatory or infectous process, ddx should include lymphoma or other lymphoproliferative disorders - s/p doxycycline 100 mg BID (4/2>4/3) and pen G 2.4 million units IM x 1  (4/2) - Doxycycline restarted,  BID (4/5>>) - Infectious Disease consulted. Appreciate  recommendations.  - Valacyclovir  BID (4/3>>4/6)-Discontinuing today  # Cellulitis- 15x17cm (increased from 10x12cm on 4/5) erythematous and warm area noted on left upper extremity - CT: detailed above, will examine right lower extremity for possible inflammatory or infectious process today - Doxycycline  BID. Will initiate Rocephin. - Left upper extremity doppler normal - Continue to monitor  #AKI/ATN: FeNA indicates a prerenal process. Repleting with fluids. Creatinine 2.20 at admission. - Creatinine 0.98 today - NS @ 75 mL/hr - Encourage PO   #HIV: Home medications included Truvada and Prezcobix - Genvoya (elvitegravir-cobicistat-emtricitabine-tenofovir) 150-150-200-10mg  - HIV RNA quant pending. 43 on 08/30/14  #HTN: Controlled. BP 134/77 this am - Holding lisinopril in setting of AKI--will restart today due to improved renal function  - Continue Metoprolol  daily  # Hypokalemia: Potassium 3.1 at admission - Potassium 4.0 today  FEN/GI: NS 75 mL/hr/ Heart healthy  Prophylaxis: hep subq  Disposition: Discharge today  Subjective:  Feeling well today without complaints. Anxious to be discharged home.  Initially presented with chief complaint of sore on penis for 2 weeks and high fever x2 days.  Objective: Temp:  [97.3 F (36.3 C)-98.3 F (36.8 C)] 97.3 F (36.3 C) (04/06 0531) Pulse Rate:  [80-86] 80 (04/05 2318) Resp:  [18] 18 (04/06 0531) BP: (130-134)/(77-83) 134/77 mmHg (04/06 0531) SpO2:  [99 %-100 %] 100 % (04/06 0531) Physical Exam: General: NAD, cooperative with exam.  Cardiovascular: RRR, S1S2, no MRG  Respiratory: CTAB, normal effort, no wheezing, crackles  Extremities: warm and well perfused, pulses intact  GU: ulcerated lesion on shaft of penis, right sided inguinal LAD improved from yesterday   Skin: 15x17cm (10x12cm on 4/5) erythemamatous and warm area noted on left upper extremity Neuro: Alert and oriented, no gross  deficits    Laboratory:  Recent Labs Lab 10/04/14 0734 10/05/14 0838 10/06/14 0610  WBC 12.2* 16.4* 16.1*  HGB 11.5* 11.5* 11.8*  HCT 35.4* 35.6* 35.7*  PLT 93* 88* 111*    Recent Labs Lab 10/02/14 0504  10/04/14 0734 10/05/14 0838 10/06/14 0610  NA 134*  < > 136 137 138  K 3.1*  < > 3.3* 3.8 4.0  CL 102  < > 108 107 107  CO2 24  < > BUN 11  < > 8 6 <5*  CREATININE 2.20*  < > 1.43* 1.22 0.98  CALCIUM 8.7  < > 8.1* 8.3* 8.8  PROT 7.7  --   --   --   --   BILITOT 0.4  --   --   --   --   ALKPHOS 77  --   --   --   --   ALT 11  --   --   --   --   AST 31  --   --   --   --   GLUCOSE 223*  < > 111* 109* 95  < > = values in this interval not displayed.- Lactic Acid 3.98>0.52 - Herpes culture negative - C. difficil negative  Imaging/Diagnostic Tests: Dg Chest 2 View  10/02/2014   CLINICAL DATA:  Fever and cough for 1 day.  Initial encounter.  EXAM: CHEST  2 VIEW  COMPARISON:  Chest radiograph performed 03/16/2014  FINDINGS: The lungs are well-aerated and clear. There is no evidence of focal opacification, pleural effusion or pneumothorax.  The heart is normal in size; the mediastinal contour is within normal limits. No acute osseous abnormalities are seen.  IMPRESSION: No acute cardiopulmonary process seen.   Electronically Signed   By: Roanna Raider M.D.   On: 10/02/2014 05:44   Ct Chest W Contrast  10/05/2014   CLINICAL DATA:  Initial encounter.  HIV.  Fever of unknown origin.  EXAM: CT CHEST, ABDOMEN, AND PELVIS WITH CONTRAST  TECHNIQUE: Multidetector CT imaging of the chest, abdomen and pelvis was performed following the standard protocol during bolus administration of intravenous contrast.  CONTRAST:  OMNIPAQUE IOHEXOL 300 MG/ML  SOLN  COMPARISON:  03/26/2014  FINDINGS: CT CHEST FINDINGS  Musculoskeletal: Normal.  Lungs:  Dependent atelectasis.  No airspace disease.  Central airways: Patent.  Vasculature: Normal.  Effusions: Tiny amount of anterior pericardial fluid or  thickening. No pleural effusion.  Lymphadenopathy: There is no axillary adenopathy. No mediastinal or hilar adenopathy.  Esophagus: Normal.  Upper abdomen: See below.  Other: None.  CT ABDOMEN AND PELVIS FINDINGS  Musculoskeletal: Mild bilateral sacroiliac joint osteoarthritis with vacuum joint and sclerosis greater on the LEFT than RIGHT.  Lung Bases: See above.  Liver:  Normal.  Spleen:  Normal.  Gallbladder:  Contracted.  No calcified stones.  Common bile duct:  Normal.  Pancreas:  Normal.  Adrenal glands:  Normal bilaterally.  Kidneys: Tiny bilateral low-density lesions likely representing renal cortical cysts. These are too small to characterize. Enhancement is within normal limits. No calculi. No hydronephrosis. LEFT ureter appears normal. RIGHT ureter appears normal.  Stomach:  Normal.  Small bowel:  Normal.  No mesenteric adenopathy.  Colon: Normal appendix. Colon is opacified with ingested oral contrast. Redundant sigmoid colon.  Pelvic Genitourinary: Normal urinary bladder. Prostate gland appears normal.  Peritoneum: No free air or free fluid.  Vasculature: Mild atherosclerosis.  Body Wall: Negative for hernia.  Lymphatic system: There is  RIGHT inguinal adenopathy. The largest RIGHT inguinal node measures 19 mm x 24 mm. Inflammatory changes are present in the RIGHT inguinal region. Stranding in the subcutaneous fat adjacent to the lymphadenopathy. The lymphadenopathy extends into the anatomic pelvis with enlarged RIGHT iliac chain lymph nodes. The largest node measures 20 mm x 31 mm (image 117 series 2). The contralateral iliac nodal chain appears normal. No retroaortic adenopathy.  IMPRESSION: RIGHT inguinal and RIGHT iliac adenopathy. Inflamed RIGHT inguinal nodes are present with subcutaneous fat stranding. Although the appearance of the nodes is nonspecific, the unilateral involvement involving the RIGHT inguinal region and RIGHT iliac nodal chain suggests a RIGHT lower extremity inflammatory or  infectious process. Other considerations include lymphoma or other lymphoproliferative disorder.   Electronically Signed   By: Andreas NewportGeoffrey  Lamke M.D.   On: 10/05/2014 18:33   Ct Abdomen Pelvis W Contrast  10/05/2014   CLINICAL DATA:  Initial encounter.  HIV.  Fever of unknown origin.  EXAM: CT CHEST, ABDOMEN, AND PELVIS WITH CONTRAST  TECHNIQUE: Multidetector CT imaging of the chest, abdomen and pelvis was performed following the standard protocol during bolus administration of intravenous contrast.  CONTRAST:  100mL OMNIPAQUE IOHEXOL 300 MG/ML  SOLN  COMPARISON:  03/26/2014  FINDINGS: CT CHEST FINDINGS  Musculoskeletal: Normal.  Lungs:  Dependent atelectasis.  No airspace disease.  Central airways: Patent.  Vasculature: Normal.  Effusions: Tiny amount of anterior pericardial fluid or thickening. No pleural effusion.  Lymphadenopathy: There is no axillary adenopathy. No mediastinal or hilar adenopathy.  Esophagus: Normal.  Upper abdomen: See below.  Other: None.  CT ABDOMEN AND PELVIS FINDINGS  Musculoskeletal: Mild bilateral sacroiliac joint osteoarthritis with vacuum joint and sclerosis greater on the LEFT than RIGHT.  Lung Bases: See above.  Liver:  Normal.  Spleen:  Normal.  Gallbladder:  Contracted.  No calcified stones.  Common bile duct:  Normal.  Pancreas:  Normal.  Adrenal glands:  Normal bilaterally.  Kidneys: Tiny bilateral low-density lesions likely representing renal cortical cysts. These are too small to characterize. Enhancement is within normal limits. No calculi. No hydronephrosis. LEFT ureter appears normal. RIGHT ureter appears normal.  Stomach:  Normal.  Small bowel:  Normal.  No mesenteric adenopathy.  Colon: Normal appendix. Colon is opacified with ingested oral contrast. Redundant sigmoid colon.  Pelvic Genitourinary: Normal urinary bladder. Prostate gland appears normal.  Peritoneum: No free air or free fluid.  Vasculature: Mild atherosclerosis.  Body Wall: Negative for hernia.  Lymphatic  system: There is RIGHT inguinal adenopathy. The largest RIGHT inguinal node measures 19 mm x 24 mm. Inflammatory changes are present in the RIGHT inguinal region. Stranding in the subcutaneous fat adjacent to the lymphadenopathy. The lymphadenopathy extends into the anatomic pelvis with enlarged RIGHT iliac chain lymph nodes. The largest node measures 20 mm x 31 mm (image 117 series 2). The contralateral iliac nodal chain appears normal. No retroaortic adenopathy.  IMPRESSION: RIGHT inguinal and RIGHT iliac adenopathy. Inflamed RIGHT inguinal nodes are present with subcutaneous fat stranding. Although the appearance of the nodes is nonspecific, the unilateral involvement involving the RIGHT inguinal region and RIGHT iliac nodal chain suggests a RIGHT lower extremity inflammatory or infectious process. Other considerations include lymphoma or other lymphoproliferative disorder.   Electronically Signed   By: Andreas NewportGeoffrey  Lamke M.D.   On: 10/05/2014 18:33   Araceli Bouchealeigh N Rumley, DO 10/06/2014, 8:14 AM PGY-1, [facility] FPTS Intern pager: [phone], text pages welcome

## 2014-10-06 NOTE — Progress Notes (Signed)
UR completed 

## 2014-10-06 NOTE — Consult Note (Signed)
Phillip Gill Phillip Gill Phillip Gill 

## 2014-10-07 ENCOUNTER — Other Ambulatory Visit: Payer: Self-pay | Admitting: Infectious Diseases

## 2014-10-07 DIAGNOSIS — B349 Viral infection, unspecified: Secondary | ICD-10-CM

## 2014-10-07 DIAGNOSIS — M7989 Other specified soft tissue disorders: Secondary | ICD-10-CM

## 2014-10-07 LAB — CBC
HEMATOCRIT: 34.6 % — AB (ref 39.0–52.0)
Hemoglobin: 11.3 g/dL — ABNORMAL LOW (ref 13.0–17.0)
MCH: 29.4 pg (ref 26.0–34.0)
MCHC: 32.7 g/dL (ref 30.0–36.0)
MCV: 89.9 fL (ref 78.0–100.0)
Platelets: 146 10*3/uL — ABNORMAL LOW (ref 150–400)
RBC: 3.85 MIL/uL — AB (ref 4.22–5.81)
RDW: 13.8 % (ref 11.5–15.5)
WBC: 13.6 10*3/uL — ABNORMAL HIGH (ref 4.0–10.5)

## 2014-10-07 LAB — HIV-1 RNA ULTRAQUANT REFLEX TO GENTYP+
HIV-1 RNA BY PCR: 70 copies/mL
HIV-1 RNA Quant, Log: 1.845 log10copy/mL

## 2014-10-07 LAB — BASIC METABOLIC PANEL
ANION GAP: 9 (ref 5–15)
CO2: 24 mmol/L (ref 19–32)
Calcium: 8.3 mg/dL — ABNORMAL LOW (ref 8.4–10.5)
Chloride: 107 mmol/L (ref 96–112)
Creatinine, Ser: 1 mg/dL (ref 0.50–1.35)
GFR calc Af Amer: >90 mL/min (ref >90)
Glucose, Bld: 96 mg/dL (ref 70–99)
POTASSIUM: 3.5 mmol/L (ref 3.5–5.1)
Sodium: 140 mmol/L (ref 135–145)

## 2014-10-07 LAB — RPR, QUANT+TP ABS (REFLEX): Rapid Plasma Reagin, Quant: 1:32 {titer} — ABNORMAL HIGH

## 2014-10-07 LAB — STOOL CULTURE: SPECIAL REQUESTS: NORMAL

## 2014-10-07 MED ORDER — DOXYCYCLINE HYCLATE 100 MG PO TABS: 100.0000 mg | ORAL_TABLET | Freq: Two times a day (BID) | ORAL | Status: DC

## 2014-10-07 MED ORDER — ELVITEG-COBIC-EMTRICIT-TENOFAF 150-150-200-10 MG PO TABS: 1.0000 | ORAL_TABLET | Freq: Every day | ORAL | Status: DC

## 2014-10-07 NOTE — Consult Note (Signed)
Stool specimen sent to the lab (light brown, soft, formed)

## 2014-10-07 NOTE — Progress Notes (Signed)
Family Medicine Teaching Service Daily Progress Note Intern Pager: [phone]  Patient name: Phillip Gill Medical record number: [id number] Date of birth: 07/28/1975 Age: 39 y.o. Gender: male  Primary Care Provider: Myra RudeSchmitz, Jeremy E, MD Consultants: None  Code Status: Full   Assessment and Plan: 39 y.o. male presenting with fever. PMH is significant for HIV, HTN, anxiety, Hep b infection, tobacco dependence, and fibromyalgia.   #Fever: Ulcerated lesion noted on penis. 10x12cm erythematous and warm area noted on left upper extremity. Diarrhea noted--improving. 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 - RPR : + RPR, + treponema study, s/p PCN -Ulcer improving - HSV culture: negative, Valacyclovir d/ced - Chlamydia positive, Gonorrhea negative, on Doxycycline - Consider LGV- will treat with Doxy x3 weeks - Infectious Disease consulted. Appreciate recommendations.   # Cellulitis- 15x17cm (increased from 10x12cm on 4/5) erythematous and warm area noted on left upper extremity - CT: detailed above, will examine right lower extremity for possible inflammatory or infectious process today - Doxycycline 100mg  BID. Never received rosephin yesterday, but cellulitis still improved - Left upper extremity doppler normal - Continue to monitor  #AKI/ATN: FeNA indicates a prerenal  process. Repleting with fluids. Creatinine 2.20 at admission. - Creatinine 1.00 today stable - KVO - Encourage PO   #HIV: Home medications included Truvada and Prezcobix - Genvoya (elvitegravir-cobicistat-emtricitabine-tenofovir) 150-150-200-10mg  - HIV RNA quant pending. 43 on 08/30/14  #MSM - Counseled to use condoms with intercourse always  #HTN: Controlled.  - Continue home lisinopril - Continue Metoprolol 25mg  daily  # Hypokalemia: Potassium 3.1 at admission - Potassium 3.5 today  FEN/GI: NS 75 mL/hr/ Heart healthy  Prophylaxis: hep subq  Disposition: Discharge today  Subjective:  Feeling well today without complaints. Anxious to be discharged home.   Objective: Temp:  [97.9 F (36.6 C)-98.5 F (36.9 C)] 98.5 F (36.9 C) (04/07 0620) Pulse Rate:  [66-85] 66 (04/07 0620) Resp:  [17-24] 17 (04/07 0620) BP: (124-136)/(73-86) 136/78 mmHg (04/07 0620) SpO2:  [99 %-100 %] 100 % (04/07 14780620) Physical Exam: General: NAD, cooperative with exam.  Cardiovascular: RRR, S1S2, no MRG  Respiratory: CTAB, normal effort, no wheezing, crackles  Extremities: warm and well perfused, pulses intact,  LGU: ulcerated lesion on shaft of penis, right sided inguinal LAD  Skin: LUE cellulitis markedly improved, decreased and receded beyond marked outlines  Neuro: Alert and oriented, no gross deficits   Laboratory:  Recent Labs Lab 10/05/14 0838 10/06/14 0610 10/07/14 0541  WBC 16.4* 16.1* 13.6*  HGB 11.5* 11.8* 11.3*  HCT 35.6* 35.7* 34.6*  PLT 88* 111* 146*    Recent Labs Lab 10/02/14 0504  10/05/14 0838 10/06/14 0610 10/07/14 0541  NA 134*  < > 137 138 140  K 3.1*  < > 3.8 4.0 3.5  CL 102  < > 107 107 107  CO2 24  < > 25 23 24   BUN 11  < > 6 <5* <5*  CREATININE 2.20*  < > 1.22 0.98 1.00  CALCIUM 8.7  < > 8.3* 8.8 8.3*  PROT 7.7  --   --   --   --   BILITOT 0.4  --   --   --   --   ALKPHOS 77  --   --   --   --   ALT 11  --   --   --   --   AST 31  --   --    --   --   GLUCOSE 223*  < > 109* 95 96  < > = values in this interval not displayed.- Lactic Acid 3.98>0.52 - Herpes culture negative - C. difficil negative - + RPR, treponema testing, s/p penicillin Imaging/Diagnostic Tests:  MR Left Humerus  IMPRESSION: 1. Mild soft tissue edema along the lateral aspect of the left upper arm which may be reactive versus secondary to mild cellulitis. There is no focal drainable fluid collection. 2. No osseous abnormality to suggest osteomyelitis. 3. No muscle signal abnormality to suggest myositis.  Bonney Aid, MD 10/07/2014, 9:13 AM PGY-1, [facility] FPTS Intern pager: [phone], text pages welcome

## 2014-10-07 NOTE — Progress Notes (Signed)
10/07/14 Patient discharged from hospital today, IV site removed and discharge instructions reviewed with patient.

## 2014-10-08 LAB — MISCELLANEOUS TEST

## 2014-10-08 LAB — CULTURE, BLOOD (ROUTINE X 2)
Culture: NO GROWTH
Culture: NO GROWTH

## 2014-10-08 LAB — NGI HBV SUPERQUANT: Hepatitis B DNA: NOT DETECTED IU/mL (ref <20)

## 2014-10-08 LAB — GIARDIA/CRYPTOSPORIDIUM SCREEN(EIA)
Cryptosporidium Screen (EIA): NEGATIVE
GIARDIA SCREEN - EIA: NEGATIVE

## 2014-10-09 NOTE — Discharge Summary (Signed)
[facility]  Patient name: Phillip HalstedShanna M Rollison Medical record number: [id number] Date of birth: December 20, 1975 Age: 39 y.o. Gender: male Date of Admission: 10/02/2014  Date of Discharge: 10/07/14 Admitting Physician: Moses MannersWilliam A Hensel, MD  Primary Care Provider: Myra RudeSchmitz, Jeremy E, MD Consultants: Infectious Disease  Indication for Hospitalization: Fever of Unknown Source in Immunocompromised  Discharge Diagnoses/Problem List:  Fever Chlamydia, Lymphogranuloma venereum Syphilis Hepatitis B HIV Cellulitis AKI/ATN Hypokalemia Hyperglycemia HTN Anxiety  Disposition: Discharge Home  Discharge Condition: Stable  Discharge Exam: Please refer to Progress Note from 10/07/14  Brief Hospital Course:  Phillip Gill is a 39yo male with history of HIV and Hepatitis B who presented to the Emergency Department on 10/02/14 with fever of 103, diarrhea, fatigue, and weakness. Also notes 2 week history of new ulcer on penis that is intermittently painful. No signs of mucocutaneous involvement. Did not note any drainage from ulcer. Also notes painful lymphadenopathy in right groin. Normal respirations, heart rate, and blood pressure at presentation. Lactic acid 3.98. Urinalysis with trace leukocytes, trace hemoglobin, few bacteria. Creatinine 2.2 noted with previous baseline of 1.1-1.2 suspected to be prerenal or secondary to HIV medications; fluid bolus given in ED and NS at 8175mL/hr initiated. One dose of PCN 2.494million units IM given in ED. [facility]     Initial lab workup after admission showed flu pcr negative. Potassium noted to be low initially, repleted with Kdur. Lactic acid improved to 0.52. HSV culture, RPR, GC/Chlamydia obtained and described below.  Doxycycline initiated on 10/02/14 due to ulcerative lesion on penis and lymphadenopathy. Infectious Disease was consulted on 4/3 due to  continued waxing and waning fevers and immunocompromised status. ID recommended discontinuation of Doxycycline and initiation of Valacyclovir 500mg  BID due to suspected HSV on 4/3. HIV medications, including Truvada and Prezcobix, were held due to AKI. On 4/4, a small area of erythema and warmth was noted on Phillip Gill's left upper arm where he reported a tourniquet was applied previously. Area of erythema continued to enlarge and on 4/5 Doxycycline 100mg  BID was initiated for suspected cellulitis. Left upper extremity doppler negative. Genvoya (elvitegravir-cobicistat-emtricitabine-tenofovir) was also initiated per ID. ID also recommended CT chest/abdomen/pelvis, which showed right sided lymphadenopathy. Phillip Gill was informed on 4/5 that the state health department received report of a positive lab test; state health department was contacted and RPR was positive on 4/2. Phillip Gill was already treated with PCN at admission, so addition treatment for syphilis was not warranted. Positive treponema study also noted. Chlamydia results returned on 4/5 and were positive; gonorrhea negative. Ulcer and right sided lymphadenopathy suspected to be secondary to Lymphogranuloma venereum and doxycycline initially started for cellulitis was continued to treat both cellulitis and LGV. Fevers gradually improved with tylenol, with last fever noted on 10/05/14.    Additionally, blood pressure noted to be normotensive at admission, so home Lisinopril was held due to AKI. Lisinopril was restarted on 4/6 with resolved AKI and elevated blood pressures.  Phillip Gill was discharged on 4/7 with resolution of fevers and improvement of painful right sided lymphadenopathy, ulcer, and cellulitis.   Issues for Follow Up:  1. Discharged with 21 day course of Doxycycline for LGV. Stop date 10/26/14.  2. Follow up cellulitis on left upper arm. Follow up penile ulcer. 3. HIV medications changed during hospitalization. Discharged  with Genvoya. 4. Continue to counsel on safe sex practices. History of HIV and hepatitis  B, now with syphilis and chlamydia  Significant Procedures: None  Significant Labs and Imaging:   Recent Labs Lab 10/05/14 0838 10/06/14 0610 10/07/14 0541  WBC 16.4* 16.1* 13.6*  HGB 11.5* 11.8* 11.3*  HCT 35.6* 35.7* 34.6*  PLT 88* 111* 146*    Recent Labs Lab 10/03/14 0835 10/04/14 0734 10/05/14 0838 10/06/14 0610 10/07/14 0541  NA 135 136 137 138 140  K 4.1 3.3* 3.8 4.0 3.5  CL 108 108 107 107 107  CO2 24 25 25 23 24   GLUCOSE 102* 111* 109* 95 96  BUN 7 8 6  <5* <5*  CREATININE 1.50* 1.43* 1.22 0.98 1.00  CALCIUM 8.2* 8.1* 8.3* 8.8 8.3*   Urinalysis    Component Value Date/Time   COLORURINE AMBER* 10/02/2014 0812   APPEARANCEUR CLEAR 10/02/2014 0812   LABSPEC 1.015 10/02/2014 0812   PHURINE 5.0 10/02/2014 0812   GLUCOSEU NEGATIVE 10/02/2014 0812   HGBUR TRACE* 10/02/2014 0812   BILIRUBINUR SMALL* 10/02/2014 0812   KETONESUR 15* 10/02/2014 0812   PROTEINUR 30* 10/02/2014 0812   UROBILINOGEN 1.0 10/02/2014 0812   NITRITE NEGATIVE 10/02/2014 0812   LEUKOCYTESUR TRACE* 10/02/2014 0812  - Lactic Acid 3.98 > 0.52 - A1C 5.5 - Chlamydia positive - RPR reactive - Gonorrhea negative - Hepatitis A negative - Hepatitis B surface antigen positive  Dg Chest 2 View  10/02/2014   CLINICAL DATA:  Fever and cough for 1 day.  Initial encounter.  EXAM: CHEST  2 VIEW  COMPARISON:  Chest radiograph performed 03/16/2014  FINDINGS: The lungs are well-aerated and clear. There is no evidence of focal opacification, pleural effusion or pneumothorax.  The heart is normal in size; the mediastinal contour is within normal limits. No acute osseous abnormalities are seen.  IMPRESSION: No acute cardiopulmonary process seen.   Electronically Signed   By: Roanna Raider M.D.   On: 10/02/2014 05:44   Ct Chest W Contrast  10/05/2014   CLINICAL DATA:  Initial encounter.  HIV.  Fever of unknown origin.   EXAM: CT CHEST, ABDOMEN, AND PELVIS WITH CONTRAST  TECHNIQUE: Multidetector CT imaging of the chest, abdomen and pelvis was performed following the standard protocol during bolus administration of intravenous contrast.  CONTRAST:  OMNIPAQUE IOHEXOL 300 MG/ML  SOLN  COMPARISON:  03/26/2014  FINDINGS: CT CHEST FINDINGS  Musculoskeletal: Normal.  Lungs:  Dependent atelectasis.  No airspace disease.  Central airways: Patent.  Vasculature: Normal.  Effusions: Tiny amount of anterior pericardial fluid or thickening. No pleural effusion.  Lymphadenopathy: There is no axillary adenopathy. No mediastinal or hilar adenopathy.  Esophagus: Normal.  Upper abdomen: See below.  Other: None.  CT ABDOMEN AND PELVIS FINDINGS  Musculoskeletal: Mild bilateral sacroiliac joint osteoarthritis with vacuum joint and sclerosis greater on the LEFT than RIGHT.  Lung Bases: See above.  Liver:  Normal.  Spleen:  Normal.  Gallbladder:  Contracted.  No calcified stones.  Common bile duct:  Normal.  Pancreas:  Normal.  Adrenal glands:  Normal bilaterally.  Kidneys: Tiny bilateral low-density lesions likely representing renal cortical cysts. These are too small to characterize. Enhancement is within normal limits. No calculi. No hydronephrosis. LEFT ureter appears normal. RIGHT ureter appears normal.  Stomach:  Normal.  Small bowel:  Normal.  No mesenteric adenopathy.  Colon: Normal appendix. Colon is opacified with ingested oral contrast. Redundant sigmoid colon.  Pelvic Genitourinary: Normal urinary bladder. Prostate gland appears normal.  Peritoneum: No free air or free fluid.  Vasculature: Mild atherosclerosis.  Body Wall:  Negative for hernia.  Lymphatic system: There is RIGHT inguinal adenopathy. The largest RIGHT inguinal node measures 19 mm x 24 mm. Inflammatory changes are present in the RIGHT inguinal region. Stranding in the subcutaneous fat adjacent to the lymphadenopathy. The lymphadenopathy extends into the anatomic pelvis with  enlarged RIGHT iliac chain lymph nodes. The largest node measures 20 mm x 31 mm (image 117 series 2). The contralateral iliac nodal chain appears normal. No retroaortic adenopathy.  IMPRESSION: RIGHT inguinal and RIGHT iliac adenopathy. Inflamed RIGHT inguinal nodes are present with subcutaneous fat stranding. Although the appearance of the nodes is nonspecific, the unilateral involvement involving the RIGHT inguinal region and RIGHT iliac nodal chain suggests a RIGHT lower extremity inflammatory or infectious process. Other considerations include lymphoma or other lymphoproliferative disorder.   Electronically Signed   By: Andreas Newport M.D.   On: 10/05/2014 18:33   Mr Humerus Left W Wo Contrast  10/07/2014   CLINICAL DATA:  Left arm swelling. Left upper extremity erythema and edema with induration.  EXAM: MRI OF THE LEFT HUMERUS WITHOUT AND WITH CONTRAST  TECHNIQUE: Multiplanar, multisequence MR imaging was performed both before and after administration of intravenous contrast.  CONTRAST:  18 mL MultiHance  COMPARISON:  None.  FINDINGS: There is soft tissue edema along the lateral aspect of the left upper arm. There is no focal fluid collection or hematoma. There is no soft tissue mass. Enhancement scratch neck evaluation for enhancement is extremely limited secondary to artifact resulting from poor fat saturation.  The muscles are normal in signal.  There is no muscle edema.  There is no marrow signal abnormality. No fracture or dislocation. No periosteal reaction or bone destruction. There is no glenohumeral joint effusion. There is no axillary lymphadenopathy. The neurovascular bundles are normal.  IMPRESSION: 1. Mild soft tissue edema along the lateral aspect of the left upper arm which may be reactive versus secondary to mild cellulitis. There is no focal drainable fluid collection. 2. No osseous abnormality to suggest osteomyelitis. 3. No muscle signal abnormality to suggest myositis.   Electronically  Signed   By: Elige Ko   On: 10/07/2014 08:35   Ct Abdomen Pelvis W Contrast  10/05/2014   CLINICAL DATA:  Initial encounter.  HIV.  Fever of unknown origin.  EXAM: CT CHEST, ABDOMEN, AND PELVIS WITH CONTRAST  TECHNIQUE: Multidetector CT imaging of the chest, abdomen and pelvis was performed following the standard protocol during bolus administration of intravenous contrast.  CONTRAST:  OMNIPAQUE IOHEXOL 300 MG/ML  SOLN  COMPARISON:  03/26/2014  FINDINGS: CT CHEST FINDINGS  Musculoskeletal: Normal.  Lungs:  Dependent atelectasis.  No airspace disease.  Central airways: Patent.  Vasculature: Normal.  Effusions: Tiny amount of anterior pericardial fluid or thickening. No pleural effusion.  Lymphadenopathy: There is no axillary adenopathy. No mediastinal or hilar adenopathy.  Esophagus: Normal.  Upper abdomen: See below.  Other: None.  CT ABDOMEN AND PELVIS FINDINGS  Musculoskeletal: Mild bilateral sacroiliac joint osteoarthritis with vacuum joint and sclerosis greater on the LEFT than RIGHT.  Lung Bases: See above.  Liver:  Normal.  Spleen:  Normal.  Gallbladder:  Contracted.  No calcified stones.  Common bile duct:  Normal.  Pancreas:  Normal.  Adrenal glands:  Normal bilaterally.  Kidneys: Tiny bilateral low-density lesions likely representing renal cortical cysts. These are too small to characterize. Enhancement is within normal limits. No calculi. No hydronephrosis. LEFT ureter appears normal. RIGHT ureter appears normal.  Stomach:  Normal.  Small bowel:  Normal.  No mesenteric adenopathy.  Colon: Normal appendix. Colon is opacified with ingested oral contrast. Redundant sigmoid colon.  Pelvic Genitourinary: Normal urinary bladder. Prostate gland appears normal.  Peritoneum: No free air or free fluid.  Vasculature: Mild atherosclerosis.  Body Wall: Negative for hernia.  Lymphatic system: There is RIGHT inguinal adenopathy. The largest RIGHT inguinal node measures 19 mm x 24 mm. Inflammatory changes are  present in the RIGHT inguinal region. Stranding in the subcutaneous fat adjacent to the lymphadenopathy. The lymphadenopathy extends into the anatomic pelvis with enlarged RIGHT iliac chain lymph nodes. The largest node measures 20 mm x 31 mm (image 117 series 2). The contralateral iliac nodal chain appears normal. No retroaortic adenopathy.  IMPRESSION: RIGHT inguinal and RIGHT iliac adenopathy. Inflamed RIGHT inguinal nodes are present with subcutaneous fat stranding. Although the appearance of the nodes is nonspecific, the unilateral involvement involving the RIGHT inguinal region and RIGHT iliac nodal chain suggests a RIGHT lower extremity inflammatory or infectious process. Other considerations include lymphoma or other lymphoproliferative disorder.   Electronically Signed   By: Andreas Newport M.D.   On: 10/05/2014 18:33   Results/Tests Pending at Time of Discharge: None  Discharge Medications:    Medication List    STOP taking these medications        darunavir-cobicistat 800-150 MG per tablet  Commonly known as:  PREZCOBIX     emtricitabine-tenofovir 200-300 MG per tablet  Commonly known as:  TRUVADA     lisinopril 20 MG tablet  Commonly known as:  PRINIVIL,ZESTRIL      TAKE these medications        doxycycline 100 MG tablet  Commonly known as:  VIBRA-TABS  Take 1 tablet (100 mg total) by mouth every 12 (twelve) hours.     elvitegravir-cobicistat-emtricitabine-tenofovir 150-150-200-10 MG Tabs tablet  Commonly known as:  GENVOYA  Take 1 tablet by mouth daily with breakfast.     HYDROcodone-acetaminophen 5-325 MG per tablet  Commonly known as:  NORCO  Take 1 tablet by mouth every 6 (six) hours as needed for moderate pain.     imiquimod 5 % cream  Commonly known as:  ALDARA  APPLY TO AFFECTED AREA 3 TIMES A WEEK     metoprolol succinate 25 MG 24 hr tablet  Commonly known as:  TOPROL-XL  Take 1 tablet (25 mg total) by mouth daily.        Discharge Instructions: Please  refer to Patient Instructions section of EMR for full details.  Patient was counseled important signs and symptoms that should prompt return to medical care, changes in medications, dietary instructions, activity restrictions, and follow up appointments.   Follow-Up Appointments:     Follow-up Information    Follow up with Felix Pacini, DO On 10/11/2014.   Specialty:  Family Medicine   Why:  2:30   Contact information:   [address] [place] [address] [phone]       Follow up with [facility]             .   Why:  to make a follow up appointment for 1-2 weeks   Contact information:   [address] [place] [address]       [place], [place] 10/09/2014, 11:54 PM PGY-1, [facility]

## 2014-10-11 ENCOUNTER — Ambulatory Visit (INDEPENDENT_AMBULATORY_CARE_PROVIDER_SITE_OTHER): Payer: PRIVATE HEALTH INSURANCE | Admitting: Family Medicine

## 2014-10-11 ENCOUNTER — Encounter: Payer: Self-pay | Admitting: Family Medicine

## 2014-10-11 VITALS — BP 138/85 | HR 80 | Temp 98.3°F | Ht 72.0 in | Wt 183.6 lb

## 2014-10-11 DIAGNOSIS — A55 Chlamydial lymphogranuloma (venereum): Secondary | ICD-10-CM | POA: Diagnosis not present

## 2014-10-11 DIAGNOSIS — B20 Human immunodeficiency virus [HIV] disease: Secondary | ICD-10-CM

## 2014-10-11 MED ORDER — DOXYCYCLINE HYCLATE 100 MG PO TABS: 100.0000 mg | ORAL_TABLET | Freq: Two times a day (BID) | ORAL | Status: AC

## 2014-10-11 NOTE — Assessment & Plan Note (Signed)
Patient tolerating change in medication, reporting no side effects today. Follow-up with ID as indicated.

## 2014-10-11 NOTE — Patient Instructions (Signed)
It was good to see you today. I have called in additional doxycycline for you to cover you and he'll April 26. You are to take doxycycline 1 tablet 2 times a day through April 26. I am glad that you're feeling better, continue to practice safe sex precautions and follow-up with your infectious disease doctor as indicated.  Safe Sex Safe sex is about reducing the risk of giving or getting a sexually transmitted disease (STD). STDs are spread through sexual contact involving the genitals, mouth, or rectum. Some STDs can be cured and others cannot. Safe sex can also prevent unintended pregnancies.  WHAT ARE SOME SAFE SEX PRACTICES?  Limit your sexual activity to only one partner who is having sex with only you.  Talk to your partner about his or her past partners, past STDs, and drug use.  Use a condom every time you have sexual intercourse. This includes vaginal, oral, and anal sexual activity. Both females and males should wear condoms during oral sex. Only use latex or polyurethane condoms and water-based lubricants. Using petroleum-based lubricants or oils to lubricate a condom will weaken the condom and increase the chance that it will break. The condom should be in place from the beginning to the end of sexual activity. Wearing a condom reduces, but does not completely eliminate, your risk of getting or giving an STD. STDs can be spread by contact with infected body fluids and skin.  Get vaccinated for hepatitis B and HPV.  Avoid alcohol and recreational drugs, which can affect your judgment. You may forget to use a condom or participate in high-risk sex.  For females, avoid douching after sexual intercourse. Douching can spread an infection farther into the reproductive tract.  Check your body for signs of sores, blisters, rashes, or unusual discharge. See your health care provider if you notice any of these signs.  Avoid sexual contact if you have symptoms of an infection or are being  treated for an STD. If you or your partner has herpes, avoid sexual contact when blisters are present. Use condoms at all other times.  If you are at risk of being infected with HIV, it is recommended that you take a prescription medicine daily to prevent HIV infection. This is called pre-exposure prophylaxis (PrEP). You are considered at risk if:  You are a man who has sex with other men (MSM).  You are a heterosexual man or woman who is sexually active with more than one partner.  You take drugs by injection.  You are sexually active with a partner who has HIV.  Talk with your health care provider about whether you are at high risk of being infected with HIV. If you choose to begin PrEP, you should first be tested for HIV. You should then be tested every 3 months for as long as you are taking PrEP.  See your health care provider for regular screenings, exams, and tests for other STDs. Before having sex with a new partner, each of you should be screened for STDs and should talk about the results with each other. WHAT ARE THE BENEFITS OF SAFE SEX?   There is less chance of getting or giving an STD.  You can prevent unwanted or unintended pregnancies.  By discussing safe sex concerns with your partner, you may increase feelings of intimacy, comfort, trust, and honesty between the two of you. Document Released: 07/26/2004 Document Revised: 11/02/2013 Document Reviewed: 12/10/2011 [facility] Patient Information 2015 [employer] [place]. This information is not intended to  replace advice given to you by your health care provider. Make sure you discuss any questions you have with your health care provider.  

## 2014-10-11 NOTE — Progress Notes (Signed)
   Subjective:    Patient ID: Phillip Gill, male    DOB: [birth year], 39 y.o.   MRN: [id number]  HPI  Hospital follow-up: Patient presents to family medicine Center for hospital follow-up today, after an admission for fever of unknown source in an immunocompromised patient with diarrhea fatigue and weakness. Patient has a history of HIV and chronic hepatitis B.  Patient was also found to have a pain ulcer that was proven to be lymphogranuloma venereum. Patient states that he is feeling much improved since his discharge from the hospital. He feels back to his normal state, has been afebrile, having normal bowel movements, eating and drinking well. He continues to take his doxycycline, but was not prescribed enough medication to get him through till April 26, which would be the end date for 21 day course. Patient's left upper extremity is much improved today, no erythema. He reports that he feels that his back to normal, but still has a small "knot "mid upper extremity. Patient is tolerating his new HIV medicine as well. He reports no known side effects from the changes in medication. Truvada and Prezcobix are held during hospital admissions secondary to acute kidney injury. Genvoya was started per ID.  Past Medical History  Diagnosis Date  . Anxiety   . Depression   . HIV infection     followed by Dr. Luciana Axeomer- sees him every 4 months  . Hypertension   . Anxiety   . Arthritis   . 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 Migraine   . IBS (irritable bowel syndrome)   . Renal insufficiency 12/29/2013   Allergies  Allergen Reactions  . Bactrim [Sulfamethoxazole W/Trimethoprim (Co-Trimoxazole)] Hives and Shortness Of Breath  . Cymbalta [Duloxetine Hcl] Diarrhea  . Other Hives and Swelling    [employer] toothpaste   . Zoloft [Sertraline Hcl] Diarrhea  . Neurontin [Gabapentin] Rash     Review of Systems Per HPI    Objective:   Physical Exam BP 138/85 mmHg  Pulse 80  Temp(Src) 98.3  F (36.8 C) (Oral)  Ht 6' (1.829 m)  Wt 183 lb 9.6 oz (83.28 kg)  BMI 24.90 kg/m2 Gen: NAD. Well-developed, well-nourished, African-American male. HEENT: AT. [place]. Bilateral eyes without injections or icterus. MMM.  Abd: Soft. Flat. NTND. BS present. No Masses palpated.  Extremities: Left upper extremity without erythema. No tenderness. Normal range of motion. Neurovascular intact distally. GU: No erythema, penile ulcer appears mildly improved. No drainage. Remains tender to palpation. Right-sided inguinal LAD.     Assessment & Plan:

## 2014-10-11 NOTE — Assessment & Plan Note (Signed)
Pill ulcers improving, no drainage or signs of superinfection. Patient given additional doxycycline, to allow 21 days of treatment. Follow-up as needed

## 2014-10-20 ENCOUNTER — Ambulatory Visit (INDEPENDENT_AMBULATORY_CARE_PROVIDER_SITE_OTHER): Payer: Self-pay | Admitting: Infectious Disease

## 2014-10-20 VITALS — BP 141/102 | HR 99 | Temp 99.1°F | Wt 183.0 lb

## 2014-10-20 DIAGNOSIS — R197 Diarrhea, unspecified: Secondary | ICD-10-CM

## 2014-10-20 DIAGNOSIS — D696 Thrombocytopenia, unspecified: Secondary | ICD-10-CM

## 2014-10-20 DIAGNOSIS — R599 Enlarged lymph nodes, unspecified: Secondary | ICD-10-CM

## 2014-10-20 DIAGNOSIS — B20 Human immunodeficiency virus [HIV] disease: Secondary | ICD-10-CM

## 2014-10-20 DIAGNOSIS — A510 Primary genital syphilis: Secondary | ICD-10-CM

## 2014-10-20 DIAGNOSIS — R590 Localized enlarged lymph nodes: Secondary | ICD-10-CM

## 2014-10-20 DIAGNOSIS — A55 Chlamydial lymphogranuloma (venereum): Secondary | ICD-10-CM

## 2014-10-20 MED ORDER — ELVITEG-COBIC-EMTRICIT-TENOFAF 150-150-200-10 MG PO TABS: 1.0000 | ORAL_TABLET | Freq: Every day | ORAL | Status: DC

## 2014-10-20 NOTE — Progress Notes (Signed)
   Subjective:    Patient ID: Phillip HalstedShanna M Gill, male    DOB: 11/23/1975, 39 y.o.   MRN: [id number]  HPI  39 year old African American man who I have followed for some time now who [facility] with septic syndrome and apparent LGV (Chlamydia was + from urine) and he had inguinal LA and painless ulcer and was quiet systemically ill. He also had syphilis with + RPR to 1:64 and one could argue about the ulcer as to whether due to syphilis or LGV. He received 2.4 mu of PCN IM for syphilis, and is on course to complete 21 days of doxycycline. He also had received rocephin and azithromycin despite not being diagnosed with GC.   He returns to clinic for followup today and has some congestion and minimally sore throat, low grade fever.  Review of Systems  Constitutional: Positive for fever. Negative for chills, diaphoresis, activity change, appetite change, fatigue and unexpected weight change.  HENT: Negative for congestion, rhinorrhea, sinus pressure, sneezing, sore throat and trouble swallowing.   Eyes: Negative for photophobia and visual disturbance.  Respiratory: Negative for cough, chest tightness, shortness of breath, wheezing and stridor.   Cardiovascular: Negative for chest pain, palpitations and leg swelling.  Gastrointestinal: Negative for nausea, vomiting, abdominal pain, diarrhea, constipation, blood in stool, abdominal distention and anal bleeding.  Genitourinary: Negative for dysuria, hematuria, flank pain and difficulty urinating.  Musculoskeletal: Negative for myalgias, back pain, joint swelling, arthralgias and gait problem.  Skin: Negative for color change, pallor, rash and wound.  Neurological: Negative for dizziness, tremors, weakness and light-headedness.  Hematological: Negative for adenopathy. Does not bruise/bleed easily.  Psychiatric/Behavioral: Negative for behavioral problems, confusion, sleep disturbance, dysphoric mood, decreased concentration and agitation.        Objective:   Physical Exam  Constitutional: He is oriented to person, place, and time. He appears well-developed and well-nourished.  HENT:  Head: Normocephalic and atraumatic.  Mouth/Throat: Uvula is midline and oropharynx is clear and moist. No oropharyngeal exudate or posterior oropharyngeal edema.  Eyes: Conjunctivae and EOM are normal.  Neck: Normal range of motion. Neck supple.  Cardiovascular: Normal rate and regular rhythm.   Pulmonary/Chest: Effort normal. No respiratory distress. He has no wheezes.  Abdominal: Soft. He exhibits no distension.  Musculoskeletal: Normal range of motion. He exhibits no edema or tenderness.  Neurological: He is alert and oriented to person, place, and time.  Skin: Skin is warm and dry. No rash noted. No erythema. No pallor.  Psychiatric: He has a normal mood and affect. His behavior is normal. Judgment and thought content normal.          Assessment & Plan:   LGV: finish 21 days of therapy, have his partners tested and treated. Recheck him for chlamydia in urine in 2 months  Syphilis: follow titers  HIV: switched to Genvoya from Prezcobix and Truvada in context of renal failure. Tolerating regimen well  Diarrhea: resolved

## 2014-10-22 ENCOUNTER — Telehealth: Payer: Self-pay | Admitting: Licensed Clinical Social Worker

## 2014-10-22 NOTE — Telephone Encounter (Signed)
If he gets worse over the weekend he should come to the ED, otherwise if still with symptoms he should be seen in clinic next week. I would not be able to do it in the afternoon on Monday

## 2014-10-22 NOTE — Telephone Encounter (Signed)
Patient called stating that he had a fever of 103.4 this morning, it came down to 99.5 with ibuprofen. He has cough, congestion, and chills. Patient states he was instructed at his office visit to call back if his symptoms worsened. Please advise

## 2014-10-22 NOTE — Telephone Encounter (Signed)
Spoke with patient and he knows to come to the ED if he has more high fevers, right now it is 97.6. He states he feels "so so". He verbalized understanding.

## 2014-11-02 ENCOUNTER — Other Ambulatory Visit: Payer: Self-pay | Admitting: Infectious Disease

## 2014-11-02 ENCOUNTER — Other Ambulatory Visit: Payer: Self-pay | Admitting: Infectious Diseases

## 2014-11-06 ENCOUNTER — Other Ambulatory Visit: Payer: Self-pay | Admitting: Infectious Diseases

## 2014-11-06 DIAGNOSIS — A630 Anogenital (venereal) warts: Secondary | ICD-10-CM

## 2015-01-11 ENCOUNTER — Emergency Department (HOSPITAL_COMMUNITY)
Admission: EM | Admit: 2015-01-11 | Discharge: 2015-01-11 | Disposition: A | Discharge status: Home / Self Care | Payer: PRIVATE HEALTH INSURANCE | Attending: Emergency Medicine | Admitting: Emergency Medicine

## 2015-01-11 ENCOUNTER — Encounter (HOSPITAL_COMMUNITY): Payer: Self-pay | Admitting: Emergency Medicine

## 2015-01-11 ENCOUNTER — Emergency Department (HOSPITAL_COMMUNITY): Payer: PRIVATE HEALTH INSURANCE

## 2015-01-11 DIAGNOSIS — Z79899 Other long term (current) drug therapy: Secondary | ICD-10-CM | POA: Diagnosis not present

## 2015-01-11 DIAGNOSIS — Z8739 Personal history of other diseases of the musculoskeletal system and connective tissue: Secondary | ICD-10-CM | POA: Diagnosis not present

## 2015-01-11 DIAGNOSIS — Z8619 Personal history of other infectious and parasitic diseases: Secondary | ICD-10-CM | POA: Diagnosis not present

## 2015-01-11 DIAGNOSIS — Z720 Tobacco use: Secondary | ICD-10-CM | POA: Diagnosis not present

## 2015-01-11 DIAGNOSIS — G43909 Migraine, unspecified, not intractable, without status migrainosus: Secondary | ICD-10-CM | POA: Diagnosis not present

## 2015-01-11 DIAGNOSIS — R110 Nausea: Secondary | ICD-10-CM | POA: Insufficient documentation

## 2015-01-11 DIAGNOSIS — R1032 Left lower quadrant pain: Secondary | ICD-10-CM | POA: Insufficient documentation

## 2015-01-11 DIAGNOSIS — I10 Essential (primary) hypertension: Secondary | ICD-10-CM | POA: Insufficient documentation

## 2015-01-11 DIAGNOSIS — Z8659 Personal history of other mental and behavioral disorders: Secondary | ICD-10-CM | POA: Insufficient documentation

## 2015-01-11 DIAGNOSIS — R197 Diarrhea, unspecified: Secondary | ICD-10-CM | POA: Insufficient documentation

## 2015-01-11 DIAGNOSIS — Z8719 Personal history of other diseases of the digestive system: Secondary | ICD-10-CM | POA: Insufficient documentation

## 2015-01-11 DIAGNOSIS — Z87448 Personal history of other diseases of urinary system: Secondary | ICD-10-CM | POA: Diagnosis not present

## 2015-01-11 DIAGNOSIS — Z21 Asymptomatic human immunodeficiency virus [HIV] infection status: Secondary | ICD-10-CM | POA: Diagnosis not present

## 2015-01-11 LAB — CBC
HCT: 43.5 % (ref 39.0–52.0)
HEMOGLOBIN: 14.4 g/dL (ref 13.0–17.0)
MCH: 29.9 pg (ref 26.0–34.0)
MCHC: 33.1 g/dL (ref 30.0–36.0)
MCV: 90.4 fL (ref 78.0–100.0)
PLATELETS: 164 10*3/uL (ref 150–400)
RBC: 4.81 MIL/uL (ref 4.22–5.81)
RDW: 14.4 % (ref 11.5–15.5)
WBC: 8.4 10*3/uL (ref 4.0–10.5)

## 2015-01-11 LAB — COMPREHENSIVE METABOLIC PANEL
ALBUMIN: 3.8 g/dL (ref 3.5–5.0)
ALT: 12 U/L — ABNORMAL LOW (ref 17–63)
AST: 23 U/L (ref 15–41)
Alkaline Phosphatase: 58 U/L (ref 38–126)
Anion gap: 7 (ref 5–15)
BUN: 9 mg/dL (ref 6–20)
CALCIUM: 9.4 mg/dL (ref 8.9–10.3)
CO2: 32 mmol/L (ref 22–32)
Chloride: 103 mmol/L (ref 101–111)
Creatinine, Ser: 1.51 mg/dL — ABNORMAL HIGH (ref 0.61–1.24)
GFR calc non Af Amer: 57 mL/min — ABNORMAL LOW (ref >60)
Glucose, Bld: 85 mg/dL (ref 65–99)
Potassium: 3.6 mmol/L (ref 3.5–5.1)
SODIUM: 142 mmol/L (ref 135–145)
Total Bilirubin: 0.5 mg/dL (ref 0.3–1.2)
Total Protein: 7 g/dL (ref 6.5–8.1)

## 2015-01-11 MED ORDER — SODIUM CHLORIDE 0.9 % IV BOLUS (SEPSIS)
1000.0000 mL | Freq: Once | INTRAVENOUS | Status: AC
  Administered 2015-01-11: 1000 mL via INTRAVENOUS

## 2015-01-11 MED ORDER — ONDANSETRON HCL 4 MG/2ML IJ SOLN
4.0000 mg | Freq: Once | INTRAMUSCULAR | Status: AC
  Administered 2015-01-11: 4 mg via INTRAVENOUS
  Filled 2015-01-11: qty 2

## 2015-01-11 MED ORDER — LOPERAMIDE HCL 2 MG PO CAPS: 2.0000 mg | ORAL_CAPSULE | Freq: Four times a day (QID) | ORAL | Status: DC | PRN

## 2015-01-11 MED ORDER — LOPERAMIDE HCL 2 MG PO CAPS
4.0000 mg | ORAL_CAPSULE | Freq: Once | ORAL | Status: AC
  Administered 2015-01-11: 4 mg via ORAL
  Filled 2015-01-11: qty 2

## 2015-01-11 MED ORDER — MORPHINE SULFATE 4 MG/ML IJ SOLN
4.0000 mg | Freq: Once | INTRAMUSCULAR | Status: AC
  Administered 2015-01-11: 4 mg via INTRAVENOUS
  Filled 2015-01-11: qty 1

## 2015-01-11 MED ORDER — IOHEXOL 300 MG/ML  SOLN
100.0000 mL | Freq: Once | INTRAMUSCULAR | Status: AC | PRN
  Administered 2015-01-11: 100 mL via INTRAVENOUS

## 2015-01-11 NOTE — ED Notes (Signed)
Pt. reports diarrhea onset Saturday this week , denies nausea or vomitting , no fever or chills.

## 2015-01-11 NOTE — ED Provider Notes (Signed)
CSN: [id number]     Arrival date & time 01/11/15  0041 History   This chart was scribed for Shon Batonourtney F Horton, MD by Arlan OrganAshley Leger, ED Scribe. This patient was seen in room A03C/A03C and the patient's care was started 2:06 AM.   Chief Complaint  Patient presents with  . Diarrhea   HPI  HPI Comments: Phillip Gill is a 39 y.o. male with a PMHx of HIV infection, HTN, chronic hepatitis B, and IBS who presents to the Emergency Department complaining of intermittent,ongoing diarrhea x 2 days. No noted blood or mucous in stools. Pt also reports mild abdominal cramping.  Worse in the left lower quadrant.  Her pain 5 out of 10. OTC Pepto-Bismol attempted prior to arrival with no improvement. No recent fever, chills, nausea, or vomiting. [name] became concerned this evening as diarrhea has been ongoing for more than 24 hours. No recent lon distance travel. No new medications.    Past Medical History  Diagnosis Date  . Anxiety   . Depression   . HIV infection     followed by Dr. Luciana Axeomer- sees him every 4 months  . Hypertension   . Anxiety   . Arthritis   . 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 Migraine   . IBS (irritable bowel syndrome)   . Renal insufficiency 12/29/2013   Past Surgical History  Procedure Laterality Date  . Wisdom tooth extraction    . High resolution anoscopy N/A 02/11/2013    Procedure: HIGH RESOLUTION ANOSCOPY WITH BIOPSY, LASER ABLATION;  Surgeon: Romie LeveeAlicia Thomas, MD;  [facility];  Service: General;  Laterality: N/A;  . Co2 laser application N/A 02/11/2013    Procedure: CO2 LASER APPLICATION;  Surgeon: Romie LeveeAlicia Thomas, MD;  [facility];  Service: General;  Laterality: N/A;   Family History  Problem Relation Age of Onset  . Mental illness Neg Hx   . Hypertension Mother   . Diabetes Mother   . Stroke Mother     cerbral aneurysm  . Hypertension Brother    History  Substance Use Topics  . Smoking status:  Current Every Day Smoker -- 0.50 packs/day for 15 years    Types: Cigarettes  . Smokeless tobacco: Never Used     Comment: may try something to quit  . Alcohol Use: Yes     Comment: occasional    Review of Systems  Constitutional: Negative.  Negative for fever.  Respiratory: Negative.  Negative for chest tightness and shortness of breath.   Cardiovascular: Negative.  Negative for chest pain.  Gastrointestinal: Positive for nausea, abdominal pain and diarrhea. Negative for blood in stool.  Genitourinary: Negative.  Negative for dysuria.  Musculoskeletal: Negative for back pain.  All other systems reviewed and are negative.     Allergies  Bactrim; Cymbalta; Other; Zoloft; and Neurontin  Home Medications   Prior to Admission medications   Medication Sig Start Date End Date Taking? Authorizing Provider  elvitegravir-cobicistat-emtricitabine-tenofovir (GENVOYA) 150-150-200-10 MG TABS tablet Take 1 tablet by mouth daily with breakfast. 10/20/14   Randall Hissornelius N Van Dam, MD  HYDROcodone-acetaminophen [facility](NORCO) 5-325 MG per tablet Take 1 tablet by mouth every 6 (six) hours as needed for moderate pain. Patient not taking: Reported on 10/20/2014 09/25/13   Garnetta BuddyEdward Williamson V, MD  imiquimod Mathis Dad(ALDARA) 5 % cream APPLY TO THE AFFECTED AREAS THREE TIMES PER WEEK 11/08/14   Gardiner Barefootobert W Comer, MD  lisinopril (PRINIVIL,ZESTRIL) 20 MG tablet  TAKE 1 TABLET BY MOUTH EVERY DAY 11/02/14   Ginnie Smart, MD  loperamide (IMODIUM) 2 MG capsule Take 1 capsule (2 mg total) by mouth 4 (four) times daily as needed for diarrhea or loose stools. 01/11/15   Shon Baton, MD  metoprolol succinate (TOPROL-XL) 25 MG 24 hr tablet Take 1 tablet (25 mg total) by mouth daily. 08/30/14   Randall Hiss, MD   Triage Vitals: BP 129/114 mmHg  Pulse 61  Temp(Src) 97.8 F (36.6 C) (Oral)  Resp 16  Ht 6' (1.829 m)  Wt 179 lb (81.194 kg)  BMI 24.27 kg/m2  SpO2 100%   Physical Exam  Constitutional: He is oriented to  person, place, and time. He appears well-developed and well-nourished. No distress.  HENT:  Head: Normocephalic and atraumatic.  Cardiovascular: Normal rate, regular rhythm and normal heart sounds.   No murmur heard. Pulmonary/Chest: Effort normal and breath sounds normal. No respiratory distress. He has no wheezes.  Abdominal: Soft. Bowel sounds are normal. There is tenderness. There is no rebound and no guarding.  Left lower quadrant tenderness to palpation  Musculoskeletal: He exhibits no edema.  Lymphadenopathy:    He has no cervical adenopathy.  Neurological: He is alert and oriented to person, place, and time.  Skin: Skin is warm and dry.  Psychiatric: He has a normal mood and affect.  Nursing note and vitals reviewed.   ED Course  Procedures (including critical care time)  DIAGNOSTIC STUDIES: Oxygen Saturation is 100% on RA, Normal by my interpretation.    COORDINATION OF CARE: 2:05 AM- Will order CBC, CMP, CT Abdomen pelvis with contrast. Will give fluids, Imodium, Morphine, and Zofran. Discussed treatment plan with pt at bedside and pt agreed to plan.     Labs Review Labs Reviewed  COMPREHENSIVE METABOLIC PANEL - Abnormal; Notable for the following:    Creatinine, Ser 1.51 (*)    ALT 12 (*)    GFR calc non Af Amer 57 (*)    All other components within normal limits  CBC    Imaging Review Ct Abdomen Pelvis W Contrast  01/11/2015   CLINICAL DATA:  Intermittent ongoing diarrhea for 2 days. Mild abdominal cramping. Left lower quadrant pain.  EXAM: CT ABDOMEN AND PELVIS WITH CONTRAST  TECHNIQUE: Multidetector CT imaging of the abdomen and pelvis was performed using the standard protocol following bolus administration of intravenous contrast.  CONTRAST:  OMNIPAQUE IOHEXOL 300 MG/ML  SOLN  COMPARISON:  10/05/2014  FINDINGS: Dependent changes in the lung bases.  The liver, spleen, gallbladder, pancreas, adrenal glands, kidneys, abdominal aorta, inferior vena cava, and  retroperitoneal lymph nodes are unremarkable. Stomach is mildly distended with ingested food and contrast material. No wall thickening. Small bowel are decompressed. Colon is not abnormally distended. No wall thickening is appreciated. No free air or free fluid in the abdomen. Abdominal wall musculature appears intact.  Pelvis: Appendix is not identified. Prostate gland is not enlarged. Bladder wall is not thickened. No pelvic mass. No free or loculated pelvic fluid collections. No evidence of diverticulitis. Previously enlarged pelvic lymph nodes have decreased in size since prior study. Right external iliac chain lymph node now measuring 1.2 cm short axis dimension compared with 2 cm previously. No destructive bone lesions. Benign-appearing sclerosis focally in the right iliac bone.  IMPRESSION: No acute process demonstrated in the abdomen or pelvis. No evidence of bowel obstruction or wall thickening. Decreasing size of lymph nodes in the pelvis since prior study.  Electronically Signed   By: Burman Nieves M.D.   On: 01/11/2015 03:37     EKG Interpretation None      MDM   Final diagnoses:  Diarrhea    Patient presents with diarrhea and left lower quadrant tenderness. No signs of peritonitis on exam. Otherwise vital signs reassuring. Nontoxic. Basic labwork obtained.  Patient given fluids, nausea, pain medicine, and Imodium. Lab work is reassuring. Given degree of tenderness on exam, will obtain CT scan to rule out diverticulitis. CT scan negative for diverticulitis. On recheck, patient is comfortable appearing. Will discharge with Imodium. Suspect viral component to the patient is otherwise nontoxic and has not had any recent travel or antibiotic use.  After history, exam, and medical workup I feel the patient has been appropriately medically screened and is safe for discharge home. Pertinent diagnoses were discussed with the patient. Patient was given return precautions.   I personally  performed the services described in this documentation, which was scribed in my presence. The recorded information has been reviewed and is accurate.   Shon Baton, MD 01/11/15 [phone]

## 2015-01-11 NOTE — Discharge Instructions (Signed)

## 2015-01-11 NOTE — ED Notes (Signed)
Patient transported to CT 

## 2015-01-24 ENCOUNTER — Emergency Department (HOSPITAL_COMMUNITY)
Admission: EM | Admit: 2015-01-24 | Discharge: 2015-01-24 | Disposition: A | Discharge status: Home / Self Care | Payer: PRIVATE HEALTH INSURANCE | Attending: Emergency Medicine | Admitting: Emergency Medicine

## 2015-01-24 ENCOUNTER — Encounter (HOSPITAL_COMMUNITY): Payer: Self-pay | Admitting: Emergency Medicine

## 2015-01-24 DIAGNOSIS — Z8659 Personal history of other mental and behavioral disorders: Secondary | ICD-10-CM | POA: Diagnosis not present

## 2015-01-24 DIAGNOSIS — G43909 Migraine, unspecified, not intractable, without status migrainosus: Secondary | ICD-10-CM | POA: Insufficient documentation

## 2015-01-24 DIAGNOSIS — Z8619 Personal history of other infectious and parasitic diseases: Secondary | ICD-10-CM | POA: Diagnosis not present

## 2015-01-24 DIAGNOSIS — Z87448 Personal history of other diseases of urinary system: Secondary | ICD-10-CM | POA: Insufficient documentation

## 2015-01-24 DIAGNOSIS — Z79899 Other long term (current) drug therapy: Secondary | ICD-10-CM | POA: Diagnosis not present

## 2015-01-24 DIAGNOSIS — B9789 Other viral agents as the cause of diseases classified elsewhere: Secondary | ICD-10-CM

## 2015-01-24 DIAGNOSIS — Z8739 Personal history of other diseases of the musculoskeletal system and connective tissue: Secondary | ICD-10-CM | POA: Insufficient documentation

## 2015-01-24 DIAGNOSIS — Z8719 Personal history of other diseases of the digestive system: Secondary | ICD-10-CM | POA: Insufficient documentation

## 2015-01-24 DIAGNOSIS — J329 Chronic sinusitis, unspecified: Secondary | ICD-10-CM | POA: Insufficient documentation

## 2015-01-24 DIAGNOSIS — R0981 Nasal congestion: Secondary | ICD-10-CM | POA: Diagnosis present

## 2015-01-24 DIAGNOSIS — I10 Essential (primary) hypertension: Secondary | ICD-10-CM | POA: Insufficient documentation

## 2015-01-24 DIAGNOSIS — Z720 Tobacco use: Secondary | ICD-10-CM | POA: Diagnosis not present

## 2015-01-24 DIAGNOSIS — Z21 Asymptomatic human immunodeficiency virus [HIV] infection status: Secondary | ICD-10-CM | POA: Insufficient documentation

## 2015-01-24 MED ORDER — SALINE SPRAY 0.65 % NA SOLN: 1.0000 | NASAL | Status: DC | PRN

## 2015-01-24 NOTE — ED Provider Notes (Signed)
CSN: [id number]     Arrival date & time 01/24/15  0722 History   First MD Initiated Contact with Patient 01/24/15 [phone]     Chief Complaint  Patient presents with  . Nasal Congestion  . Cough     (Consider location/radiation/quality/duration/timing/severity/associated sxs/prior Treatment) HPI Patient is a 39 year old male past medical history of HIV, chronic hepatitis B with recent CD4 count greater than 1000, undetectable HIV viral load who presents the ER complaining of nasal congestion and cough. Patient reports gradual onset of nasal congestion, facial pressure/pain, mildly productive cough for the past 2 days. Patient denies sore throat, fever, chills, dysphagia, shortness of breath, chest pain, nausea, vomiting, headache.  Past Medical History  Diagnosis Date  . Anxiety   . Depression   . HIV infection     followed by Dr. Luciana Axe- sees him every 4 months  . Hypertension   . Anxiety   . Arthritis   . 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 Migraine   . IBS (irritable bowel syndrome)   . Renal insufficiency 12/29/2013   Past Surgical History  Procedure Laterality Date  . Wisdom tooth extraction    . High resolution anoscopy N/A 02/11/2013    Procedure: HIGH RESOLUTION ANOSCOPY WITH BIOPSY, LASER ABLATION;  Surgeon: Romie Levee, MD;  Location[facility];  Service: General;  Laterality: N/A;  . Co2 laser application N/A 02/11/2013    Procedure: CO2 LASER APPLICATION;  Surgeon: Romie Levee, MD;  Location[facility];  Service: General;  Laterality: N/A;   Family History  Problem Relation Age of Onset  . Mental illness Neg Hx   . Hypertension Mother   . Diabetes Mother   . Stroke Mother     cerbral aneurysm  . Hypertension Brother    History  Substance Use Topics  . Smoking status: Current Every Day Smoker -- 0.50 packs/day for 15 years    Types: Cigarettes  . Smokeless tobacco: Never Used     Comment: may try something to quit  .  Alcohol Use: Yes     Comment: occasional    Review of Systems  Constitutional: Negative for fever.  HENT: Positive for congestion.   Eyes: Negative for visual disturbance.  Respiratory: Positive for cough. Negative for shortness of breath.   Cardiovascular: Negative for chest pain.  Gastrointestinal: Negative for nausea, vomiting and abdominal pain.  Genitourinary: Negative for dysuria.  Skin: Negative for rash.  Neurological: Negative for dizziness, syncope, weakness and numbness.  Psychiatric/Behavioral: Negative.       Allergies  Bactrim; Cymbalta; Other; Zoloft; and Neurontin  Home Medications   Prior to Admission medications   Medication Sig Start Date End Date Taking? Authorizing Provider  elvitegravir-cobicistat-emtricitabine-tenofovir (GENVOYA) 150-150-200-10 MG TABS tablet Take 1 tablet by mouth daily with breakfast. 10/20/14   Randall Hiss, MD  HYDROcodone-acetaminophen [facility]) 5-325 MG per tablet Take 1 tablet by mouth every 6 (six) hours as needed for moderate pain. Patient not taking: Reported on 10/20/2014 09/25/13   Garnetta Buddy, MD  imiquimod Mathis Dad) 5 % cream APPLY TO THE AFFECTED AREAS THREE TIMES PER WEEK 11/08/14   Gardiner Barefoot, MD  lisinopril (PRINIVIL,ZESTRIL) 20 MG tablet TAKE 1 TABLET BY MOUTH EVERY DAY 11/02/14   Ginnie Smart, MD  loperamide (IMODIUM) 2 MG capsule Take 1 capsule (2 mg total) by mouth 4 (four) times daily as needed for diarrhea or loose stools. 01/11/15   Mayer Masker  Horton, MD  metoprolol succinate (TOPROL-XL) 25 MG 24 hr tablet Take 1 tablet (25 mg total) by mouth daily. 08/30/14   Randall Hiss, MD  sodium chloride (OCEAN) 0.65 % SOLN nasal spray Place 1 spray into both nostrils as needed for congestion. 01/24/15   Ladona Mow, PA-C   BP 156/102 mmHg  Pulse 78  Temp(Src) 98.4 F (36.9 C) (Oral)  Resp 20  SpO2 97% Physical Exam  Constitutional: He is oriented to person, place, and time. He appears well-developed and  well-nourished. No distress.  HENT:  Head: Normocephalic and atraumatic.  Right Ear: Tympanic membrane normal.  Left Ear: Tympanic membrane normal.  Mouth/Throat: Uvula is midline, oropharynx is clear and moist and mucous membranes are normal. No oropharyngeal exudate, posterior oropharyngeal edema, posterior oropharyngeal erythema or tonsillar abscesses.  Mild erythema without edema noted nasal turbinates bilaterally.  Eyes: Conjunctivae, EOM and lids are normal. Pupils are equal, round, and reactive to light. Right eye exhibits no discharge. Left eye exhibits no discharge. No scleral icterus.  Neck: Normal range of motion and full passive range of motion without pain. No spinous process tenderness and no muscular tenderness present. No rigidity. No edema, no erythema and normal range of motion present. No Brudzinski's sign and no Kernig's sign noted.  Pulmonary/Chest: Effort normal and breath sounds normal. No accessory muscle usage. No tachypnea. No respiratory distress. He has no wheezes. He has no rales. He exhibits no tenderness.  Musculoskeletal: Normal range of motion.  Neurological: He is alert and oriented to person, place, and time.  Skin: Skin is warm and dry. He is not diaphoretic.  Psychiatric: He has a normal mood and affect.  Nursing note and vitals reviewed.   ED Course  Procedures (including critical care time) Labs Review Labs Reviewed - No data to display  Imaging Review No results found.   EKG Interpretation None      MDM   Final diagnoses:  Viral sinusitis    HIV level undetectable.  2 days of nasal congestion, frontal sinus pressure, nasal congestion, mildly productive cough. Exam benign. Likely viral sinusitis   Mild to moderate symptoms of clear/yellow nasal discharge/congestion and scratchy throat with cough for less than 10 days.  Patient is afebrile.  No concern for acute bacterial rhinosinusitis; likely viral in nature.  Patient discharged with  symptomatic treatment.  Patient instructions given for warm saline nasal washes.  Recommendations for follow-up with primary care physician.  Return precautions discussed, patient verbalizes understanding and agreement with this plan.  BP 156/102 mmHg  Pulse 78  Temp(Src) 98.4 F (36.9 C) (Oral)  Resp 20  SpO2 97%  Signed,  Ladona Mow, PA-C 5:02 PM     Ladona Mow, PA-C 01/24/15 1702  Richardean Canal, MD 01/24/15 [phone]

## 2015-01-24 NOTE — Discharge Instructions (Signed)

## 2015-01-24 NOTE — ED Notes (Signed)
Patient states runny nose and cough since Saturday.  Patient states he didn't take anything for symptom relief.

## 2015-01-25 ENCOUNTER — Telehealth: Payer: Self-pay | Admitting: *Deleted

## 2015-01-25 NOTE — Telephone Encounter (Signed)
Patient will need new referral for [facility], Dr. Maisie Fus for follow up of his condyloma. Patient scheduled to follow up with Dr. Daiva Eves on 8/29. He will contact his insurance company to see if this referral must come from his PCP.  If Dr. Daiva Eves is allowed to do the referral, this will need to be addressed in the office visit notes. Patient verbalized understanding. Andree Coss, RN

## 2015-01-25 NOTE — Telephone Encounter (Signed)
Patient called asking for a referral to Dr. Maisie Fus at [facility] to continue his care for condyloma. He states he was last seen there in October. Patient has not contacted them to see if he needs a referral, or if he is still an active patient.  RN advised patient to contact [facility] to see if he can schedule. He verbalized agreement. Andree Coss, RN

## 2015-02-02 ENCOUNTER — Ambulatory Visit (INDEPENDENT_AMBULATORY_CARE_PROVIDER_SITE_OTHER): Payer: PRIVATE HEALTH INSURANCE | Admitting: Family Medicine

## 2015-02-02 ENCOUNTER — Encounter: Payer: Self-pay | Admitting: Family Medicine

## 2015-02-02 DIAGNOSIS — A630 Anogenital (venereal) warts: Secondary | ICD-10-CM | POA: Diagnosis not present

## 2015-02-02 DIAGNOSIS — R21 Rash and other nonspecific skin eruption: Secondary | ICD-10-CM | POA: Insufficient documentation

## 2015-02-02 NOTE — Progress Notes (Signed)
Subjective:    Phillip Gill - 39 y.o. male MRN [id number]  Date of birth: 07/14/1975  HPI  Phillip Gill is here for rash.  RASH  Had rash for about a month. Location: upper back  Medications tried: no Similar rash in past: no New medications or antibiotics: no Tick, Insect or new pet exposure: no Recent travel: no New detergent or soap: no Immunocompromised: yes, HIV   Symptoms Itching: yes  Pain over rash: no Feeling ill all over: no Fever: no Mouth sores: no Face or tongue swelling: no Trouble breathing: no Joint swelling or pain: no  Condyloma:  Returned about a year ago.  Dr. Maisie Fus from [facility] was able to remove one about two years ago. He is needing a referral to return. He found out when he had a stool.  Reports no pain with bowel movements. Intermittent instances of light blood when wiping. He had a significant other check for him.  He endorses some itching.   PMH - Smoking status noted.    Health Maintenance:  Health Maintenance Due  Topic Date Due  . INFLUENZA VACCINE  01/31/2015    -  reports that he has been smoking Cigarettes.  He has a 7.5 pack-year smoking history. He has never used smokeless tobacco. - Review of Systems: Per HPI. All other systems reviewed and are negative. - Past Medical History: Patient Active Problem List   Diagnosis Date Noted  . LGV (lymphogranuloma venereum)   . Primary syphilis   . HIV disease   . Cellulitis of arm, left   . Pyrexia   . Thrombocytopenia   . Leukocytosis   . FUO (fever of unknown origin)   . Bruise   . 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 Penile ulcer 10/03/2014  . Inguinal adenopathy 10/03/2014  . Diarrhea 10/03/2014  . Human immunodeficiency virus (HIV) disease   . Fever 10/02/2014  . Essential hypertension   . Gynecomastia 04/30/2014  . 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 Strain of right trapezius muscle 04/07/2014  . 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 Tobacco dependence 06/03/2012    . Fibromyalgia muscle pain 02/11/2012  . Depression 12/07/2011  . Sciatica 05/22/2011  . Anxiety 02/02/2011   - Medications: reviewed and updated Current Outpatient Prescriptions  Medication Sig Dispense Refill  . elvitegravir-cobicistat-emtricitabine-tenofovir (GENVOYA) 150-150-200-10 MG TABS tablet Take 1 tablet by mouth daily with breakfast. 30 tablet 11  . HYDROcodone-acetaminophen (NORCO) 5-325 MG per tablet Take 1 tablet by mouth every 6 (six) hours as needed for moderate pain. (Patient not taking: Reported on 10/20/2014) 30 tablet 0  . imiquimod (ALDARA) 5 % cream APPLY TO THE AFFECTED AREAS THREE TIMES PER WEEK 12 each 1  . lisinopril (PRINIVIL,ZESTRIL) 20 MG tablet TAKE 1 TABLET BY MOUTH EVERY DAY 30 tablet 6  . loperamide (IMODIUM) 2 MG capsule Take 1 capsule (2 mg total) by mouth 4 (four) times daily as needed for diarrhea or loose stools. 12 capsule 0  . metoprolol succinate (TOPROL-XL) 25 MG 24 hr tablet Take 1 tablet (25 mg total) by mouth daily. 30 tablet 11  . sodium chloride (OCEAN) 0.65 % SOLN nasal spray Place 1 spray into both nostrils as needed for congestion. 30 mL 0  . [DISCONTINUED] gabapentin (NEURONTIN) 300 MG capsule Take 1 capsule (300 mg total) by mouth 3 (three) times daily. 90 capsule 3   No current facility-administered medications for this visit.     Review of Systems See HPI  Objective:   Physical Exam BP 142/86 mmHg  Pulse 71  Temp(Src) 98.4 F (36.9 C) (Oral)  Ht 6' (1.829 m)  Wt 172 lb 9.6 oz (78.291 kg)  BMI 23.40 kg/m2 Gen: NAD, alert, cooperative with exam, well-appearing Skin: Some notable dry skin on the back but no erythema Neuro: no gross deficits.  Rectum: 2 condyloma observed in the 1:00 position  Assessment & Plan:

## 2015-02-02 NOTE — Assessment & Plan Note (Signed)
Most likely dry skin on his thoracic back - Advised Aveeno after showering - Avoid avoid really hot showers - Follow-up as needed

## 2015-02-02 NOTE — Addendum Note (Signed)
Addended by: Myra Rude on: 02/02/2015 12:37 PM   Modules accepted: Kipp Brood

## 2015-02-02 NOTE — Assessment & Plan Note (Addendum)
Reoccurrence. Seemed to do well with previous excision - Referral has been made to surgery with Dr. Maisie Fus

## 2015-02-02 NOTE — Patient Instructions (Addendum)
Thank you for coming in,   Try aveeno on your back after you take a shower.   I have made a referral to surgery.   Please follow up with me in 6 months for follow up for blood pressure.   Please bring all of your medications with you to each visit.    Please feel free to call with any questions or concerns at any time, at [phone]. --Dr. Jordan Likes

## 2015-02-09 ENCOUNTER — Emergency Department: Payer: Worker's Compensation

## 2015-02-09 ENCOUNTER — Emergency Department
Admission: EM | Admit: 2015-02-09 | Discharge: 2015-02-09 | Disposition: A | Discharge status: Home / Self Care | Payer: Worker's Compensation | Attending: Emergency Medicine | Admitting: Emergency Medicine

## 2015-02-09 DIAGNOSIS — S6991XA Unspecified injury of right wrist, hand and finger(s), initial encounter: Secondary | ICD-10-CM

## 2015-02-09 DIAGNOSIS — I10 Essential (primary) hypertension: Secondary | ICD-10-CM | POA: Insufficient documentation

## 2015-02-09 DIAGNOSIS — Y9289 Other specified places as the place of occurrence of the external cause: Secondary | ICD-10-CM | POA: Diagnosis not present

## 2015-02-09 DIAGNOSIS — Z79899 Other long term (current) drug therapy: Secondary | ICD-10-CM | POA: Insufficient documentation

## 2015-02-09 DIAGNOSIS — M25531 Pain in right wrist: Secondary | ICD-10-CM

## 2015-02-09 DIAGNOSIS — X58XXXA Exposure to other specified factors, initial encounter: Secondary | ICD-10-CM | POA: Insufficient documentation

## 2015-02-09 DIAGNOSIS — Y9389 Activity, other specified: Secondary | ICD-10-CM | POA: Diagnosis not present

## 2015-02-09 DIAGNOSIS — Y990 Civilian activity done for income or pay: Secondary | ICD-10-CM | POA: Insufficient documentation

## 2015-02-09 DIAGNOSIS — Z720 Tobacco use: Secondary | ICD-10-CM | POA: Insufficient documentation

## 2015-02-09 MED ORDER — KETOROLAC TROMETHAMINE 10 MG PO TABS
10.0000 mg | ORAL_TABLET | Freq: Once | ORAL | Status: AC
  Administered 2015-02-09: 10 mg via ORAL
  Filled 2015-02-09: qty 1

## 2015-02-09 NOTE — ED Notes (Signed)
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

## 2015-02-09 NOTE — ED Provider Notes (Signed)
[facility] Emergency Department Provider Note  ____________________________________________  Time seen: 5:15 AM  I have reviewed the triage vital signs and the nursing notes.   HISTORY  Chief Complaint Wrist Pain     HPI Phillip Gill is a 39 y.o. male presents with diffuse right wrist/hand pain status post "hammering a motor at work". Patient states current pain score is 9 out of 10 and radiates down right forearm.    Past Medical History  Diagnosis Date  . Anxiety   . Depression   . HIV infection     followed by Dr. Luciana Axe- sees him every 4 months  . Hypertension   . Anxiety   . Arthritis   . 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 Migraine   . IBS (irritable bowel syndrome)   . Renal insufficiency 12/29/2013    Patient Active Problem List   Diagnosis Date Noted  . Rash and nonspecific skin eruption 02/02/2015  . LGV (lymphogranuloma venereum)   . Primary syphilis   . HIV disease   . 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 Penile ulcer 10/03/2014  . Inguinal adenopathy 10/03/2014  . Human immunodeficiency virus (HIV) disease   . Essential hypertension   . Gynecomastia 04/30/2014  . 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 Tobacco dependence 06/03/2012  . Fibromyalgia muscle pain 02/11/2012  . Depression 12/07/2011  . Anxiety 02/02/2011    Past Surgical History  Procedure Laterality Date  . Wisdom tooth extraction    . High resolution anoscopy N/A 02/11/2013    Procedure: HIGH RESOLUTION ANOSCOPY WITH BIOPSY, LASER ABLATION;  Surgeon: Romie Levee, MD;  Location: [facility];  Service: General;  Laterality: N/A;  . Co2 laser application N/A 02/11/2013    Procedure: CO2 LASER APPLICATION;  Surgeon: Romie Levee, MD;  Location: [facility];  Service: General;  Laterality: N/A;    Current Outpatient Rx  Name  Route  Sig  Dispense  Refill  . elvitegravir-cobicistat-emtricitabine-tenofovir (GENVOYA)  150-150-200-10 MG TABS tablet   Oral   Take 1 tablet by mouth daily with breakfast.   30 tablet   11   . HYDROcodone-acetaminophen (NORCO) 5-325 MG per tablet   Oral   Take 1 tablet by mouth every 6 (six) hours as needed for moderate pain. Patient not taking: Reported on 10/20/2014   30 tablet   0   . imiquimod (ALDARA) 5 % cream      APPLY TO THE AFFECTED AREAS THREE TIMES PER WEEK   12 each   1   . lisinopril (PRINIVIL,ZESTRIL) 20 MG tablet      TAKE 1 TABLET BY MOUTH EVERY DAY   30 tablet   6   . loperamide (IMODIUM) 2 MG capsule   Oral   Take 1 capsule (2 mg total) by mouth 4 (four) times daily as needed for diarrhea or loose stools.   12 capsule   0   . metoprolol succinate (TOPROL-XL) 25 MG 24 hr tablet   Oral   Take 1 tablet (25 mg total) by mouth daily.   30 tablet   11   . sodium chloride (OCEAN) 0.65 % SOLN nasal spray   Each Nare   Place 1 spray into both nostrils as needed for congestion.   30 mL   0     Allergies Bactrim; Cymbalta; Other; Zoloft; and Neurontin  Family History  Problem Relation Age of Onset  . Mental illness Neg Hx   .  Hypertension Mother   . Diabetes Mother   . Stroke Mother     cerbral aneurysm  . Hypertension Brother     Social History Social History  Substance Use Topics  . Smoking status: Current Every Day Smoker -- 0.50 packs/day for 15 years    Types: Cigarettes  . Smokeless tobacco: Never Used     Comment: may try something to quit  . Alcohol Use: Yes     Comment: occasional    Review of Systems  Constitutional: Negative for fever. Eyes: Negative for visual changes. ENT: Negative for sore throat. Cardiovascular: Negative for chest pain. Respiratory: Negative for shortness of breath. Gastrointestinal: Negative for abdominal pain, vomiting and diarrhea. Genitourinary: Negative for dysuria. Musculoskeletal: Negative for back pain. Positive Diffuse right wrist pain Skin: Negative for rash. Neurological:  Negative for headaches, focal weakness or numbness.   10-point ROS otherwise negative.  ____________________________________________   PHYSICAL EXAM:  VITAL SIGNS: ED Triage Vitals  Enc Vitals Group     BP 02/09/15 0235 144/99 mmHg     Pulse Rate 02/09/15 0235 70     Resp 02/09/15 0235 18     Temp 02/09/15 0235 98.1 F (36.7 C)     Temp Source 02/09/15 0235 Oral     SpO2 02/09/15 0235 97 %     Weight 02/09/15 0235 173 lb (78.472 kg)     Height 02/09/15 0235 6' (1.829 m)     Head Cir --      Peak Flow --      Pain Score 02/09/15 0236 7     Pain Loc --      Pain Edu? --      Excl. in GC? --      Constitutional: Alert and oriented. Well appearing and in no distress. Eyes: Conjunctivae are normal. PERRL. Normal extraocular movements. ENT   Head: Normocephalic and atraumatic.   Nose: No congestion/rhinnorhea.   Mouth/Throat: Mucous membranes are moist.   Neck: No stridor. Cardiovascular: Normal rate, regular rhythm. Normal and symmetric distal pulses are present in all extremities. No murmurs, rubs, or gallops. Respiratory: Normal respiratory effort without tachypnea nor retractions. Breath sounds are clear and equal bilaterally. No wheezes/rales/rhonchi. Gastrointestinal: Soft and nontender. No distention. There is no CVA tenderness. Genitourinary: deferred Musculoskeletal: Nontender with normal range of motion in all extremities. No joint effusions.  No lower extremity tenderness nor edema. Neurologic:  Normal speech and language. No gross focal neurologic deficits are appreciated. Speech is normal.  Skin:  Skin is warm, dry and intact. No rash noted. Psychiatric: Mood and affect are normal. Speech and behavior are normal. Patient exhibits appropriate insight and judgment.    RADIOLOGY  Wrist Complete Right (Final result) Result time: 02/09/15 06:11:04   Final result by Rad Results In Interface (02/09/15 06:11:04)   Narrative:   CLINICAL DATA: Injury  to right wrist at work, with pain radiating up the right arm. Initial encounter.  EXAM: RIGHT WRIST - COMPLETE 3+ VIEW  COMPARISON: None.  FINDINGS: There is no evidence of fracture or dislocation. The carpal rows are intact, and demonstrate normal alignment. The joint spaces are preserved.  Mild dorsal soft tissue swelling is noted at the wrist.  IMPRESSION: No evidence of fracture or dislocation.   Electronically Signed By: Roanna Raider M.D. On: 02/09/2015 06:11         INITIAL IMPRESSION / ASSESSMENT AND PLAN / ED COURSE  Pertinent labs & imaging results that were available during my care of the  patient were reviewed by me and considered in my medical decision making (see chart for details).  Wrist x-ray revealed no gross abnormality splint applied. ____________________________________________   FINAL CLINICAL IMPRESSION(S) / ED DIAGNOSES  Final diagnoses:  Right wrist injury, initial encounter  Wrist pain, acute, right      Darci Current, MD 02/09/15 [id number]

## 2015-02-09 NOTE — Discharge Instructions (Signed)

## 2015-02-14 ENCOUNTER — Other Ambulatory Visit: Payer: Self-pay | Admitting: Internal Medicine

## 2015-02-14 ENCOUNTER — Other Ambulatory Visit (INDEPENDENT_AMBULATORY_CARE_PROVIDER_SITE_OTHER): Payer: Self-pay

## 2015-02-14 DIAGNOSIS — Z113 Encounter for screening for infections with a predominantly sexual mode of transmission: Secondary | ICD-10-CM

## 2015-02-14 DIAGNOSIS — B20 Human immunodeficiency virus [HIV] disease: Secondary | ICD-10-CM

## 2015-02-14 DIAGNOSIS — A630 Anogenital (venereal) warts: Secondary | ICD-10-CM

## 2015-02-14 DIAGNOSIS — Z79899 Other long term (current) drug therapy: Secondary | ICD-10-CM

## 2015-02-14 LAB — CBC WITH DIFFERENTIAL/PLATELET
BASOS ABS: 0 10*3/uL (ref 0.0–0.1)
Basophils Relative: 0 % (ref 0–1)
Eosinophils Absolute: 0.1 10*3/uL (ref 0.0–0.7)
Eosinophils Relative: 1 % (ref 0–5)
HCT: 42.2 % (ref 39.0–52.0)
HEMOGLOBIN: 14.6 g/dL (ref 13.0–17.0)
Lymphocytes Relative: 35 % (ref 12–46)
Lymphs Abs: 4.1 10*3/uL — ABNORMAL HIGH (ref 0.7–4.0)
MCH: 30.7 pg (ref 26.0–34.0)
MCHC: 34.6 g/dL (ref 30.0–36.0)
MCV: 88.7 fL (ref 78.0–100.0)
MPV: 10.3 fL (ref 8.6–12.4)
Monocytes Absolute: 1 10*3/uL (ref 0.1–1.0)
Monocytes Relative: 9 % (ref 3–12)
NEUTROS ABS: 6.4 10*3/uL (ref 1.7–7.7)
NEUTROS PCT: 55 % (ref 43–77)
Platelets: 176 10*3/uL (ref 150–400)
RBC: 4.76 MIL/uL (ref 4.22–5.81)
RDW: 14.5 % (ref 11.5–15.5)
WBC: 11.6 10*3/uL — AB (ref 4.0–10.5)

## 2015-02-14 LAB — LIPID PANEL
CHOL/HDL RATIO: 3.6 ratio (ref <=5.0)
Cholesterol: 132 mg/dL (ref 125–200)
HDL: 37 mg/dL — ABNORMAL LOW (ref >=40)
LDL Cholesterol: 79 mg/dL (ref <130)
Triglycerides: 82 mg/dL (ref <150)
VLDL: 16 mg/dL (ref <30)

## 2015-02-14 LAB — COMPLETE METABOLIC PANEL WITH GFR
ALBUMIN: 4 g/dL (ref 3.6–5.1)
ALK PHOS: 62 U/L (ref 40–115)
ALT: 9 U/L (ref 9–46)
AST: 20 U/L (ref 10–40)
BUN: 14 mg/dL (ref 7–25)
CO2: 29 mmol/L (ref 20–31)
Calcium: 9.2 mg/dL (ref 8.6–10.3)
Chloride: 103 mmol/L (ref 98–110)
Creat: 1.16 mg/dL (ref 0.60–1.35)
GFR, Est African American: >89 mL/min (ref >=60)
GFR, Est Non African American: 79 mL/min (ref >=60)
GLUCOSE: 72 mg/dL (ref 65–99)
Potassium: 3.6 mmol/L (ref 3.5–5.3)
SODIUM: 144 mmol/L (ref 135–146)
TOTAL PROTEIN: 7.5 g/dL (ref 6.1–8.1)
Total Bilirubin: 0.2 mg/dL (ref 0.2–1.2)

## 2015-02-15 LAB — HIV-1 RNA QUANT-NO REFLEX-BLD
HIV 1 RNA Quant: 25 copies/mL — ABNORMAL HIGH (ref <20)
HIV-1 RNA Quant, Log: 1.4 {Log} — ABNORMAL HIGH (ref <1.30)

## 2015-02-15 LAB — T-HELPER CELL (CD4) - (RCID CLINIC ONLY)
CD4 % Helper T Cell: 36 % (ref 33–55)
CD4 T Cell Abs: 1360 /uL (ref 400–2700)

## 2015-02-15 LAB — URINE CYTOLOGY ANCILLARY ONLY
CHLAMYDIA, DNA PROBE: NEGATIVE
Neisseria Gonorrhea: NEGATIVE

## 2015-02-15 LAB — RPR

## 2015-02-16 ENCOUNTER — Encounter (HOSPITAL_COMMUNITY): Payer: Self-pay | Admitting: Emergency Medicine

## 2015-02-16 ENCOUNTER — Observation Stay (HOSPITAL_COMMUNITY): Payer: PRIVATE HEALTH INSURANCE

## 2015-02-16 ENCOUNTER — Observation Stay (HOSPITAL_COMMUNITY)
Admission: EM | Admit: 2015-02-16 | Discharge: 2015-02-17 | Disposition: A | Discharge status: Home / Self Care | Payer: PRIVATE HEALTH INSURANCE | Attending: Family Medicine | Admitting: Family Medicine

## 2015-02-16 ENCOUNTER — Emergency Department (HOSPITAL_COMMUNITY): Payer: PRIVATE HEALTH INSURANCE

## 2015-02-16 ENCOUNTER — Ambulatory Visit: Payer: Self-pay

## 2015-02-16 DIAGNOSIS — I10 Essential (primary) hypertension: Secondary | ICD-10-CM | POA: Insufficient documentation

## 2015-02-16 DIAGNOSIS — Z8619 Personal history of other infectious and parasitic diseases: Secondary | ICD-10-CM | POA: Insufficient documentation

## 2015-02-16 DIAGNOSIS — B20 Human immunodeficiency virus [HIV] disease: Secondary | ICD-10-CM | POA: Insufficient documentation

## 2015-02-16 DIAGNOSIS — B1910 Unspecified viral hepatitis B without hepatic coma: Secondary | ICD-10-CM | POA: Insufficient documentation

## 2015-02-16 DIAGNOSIS — R197 Diarrhea, unspecified: Secondary | ICD-10-CM | POA: Insufficient documentation

## 2015-02-16 DIAGNOSIS — M479 Spondylosis, unspecified: Secondary | ICD-10-CM | POA: Insufficient documentation

## 2015-02-16 DIAGNOSIS — M545 Low back pain: Secondary | ICD-10-CM | POA: Insufficient documentation

## 2015-02-16 DIAGNOSIS — Z79899 Other long term (current) drug therapy: Secondary | ICD-10-CM | POA: Insufficient documentation

## 2015-02-16 DIAGNOSIS — B181 Chronic viral hepatitis B without delta-agent: Secondary | ICD-10-CM

## 2015-02-16 DIAGNOSIS — F419 Anxiety disorder, unspecified: Secondary | ICD-10-CM | POA: Insufficient documentation

## 2015-02-16 DIAGNOSIS — M797 Fibromyalgia: Secondary | ICD-10-CM | POA: Insufficient documentation

## 2015-02-16 DIAGNOSIS — E872 Acidosis, unspecified: Secondary | ICD-10-CM

## 2015-02-16 DIAGNOSIS — H5702 Anisocoria: Secondary | ICD-10-CM | POA: Insufficient documentation

## 2015-02-16 DIAGNOSIS — K589 Irritable bowel syndrome without diarrhea: Secondary | ICD-10-CM | POA: Insufficient documentation

## 2015-02-16 DIAGNOSIS — D72829 Elevated white blood cell count, unspecified: Secondary | ICD-10-CM

## 2015-02-16 DIAGNOSIS — R079 Chest pain, unspecified: Secondary | ICD-10-CM | POA: Insufficient documentation

## 2015-02-16 DIAGNOSIS — J069 Acute upper respiratory infection, unspecified: Principal | ICD-10-CM

## 2015-02-16 DIAGNOSIS — F17210 Nicotine dependence, cigarettes, uncomplicated: Secondary | ICD-10-CM | POA: Insufficient documentation

## 2015-02-16 DIAGNOSIS — R509 Fever, unspecified: Secondary | ICD-10-CM

## 2015-02-16 DIAGNOSIS — G8929 Other chronic pain: Secondary | ICD-10-CM | POA: Insufficient documentation

## 2015-02-16 DIAGNOSIS — Z21 Asymptomatic human immunodeficiency virus [HIV] infection status: Secondary | ICD-10-CM | POA: Diagnosis not present

## 2015-02-16 DIAGNOSIS — Z881 Allergy status to other antibiotic agents status: Secondary | ICD-10-CM | POA: Insufficient documentation

## 2015-02-16 DIAGNOSIS — F329 Major depressive disorder, single episode, unspecified: Secondary | ICD-10-CM | POA: Insufficient documentation

## 2015-02-16 DIAGNOSIS — Z888 Allergy status to other drugs, medicaments and biological substances status: Secondary | ICD-10-CM | POA: Insufficient documentation

## 2015-02-16 HISTORY — DX: Other chronic pain: G89.29

## 2015-02-16 HISTORY — DX: Low back pain: M54.5

## 2015-02-16 HISTORY — DX: Fibromyalgia: M79.7

## 2015-02-16 HISTORY — DX: Low back pain, unspecified: M54.50

## 2015-02-16 LAB — DIFFERENTIAL
Basophils Absolute: 0 10*3/uL (ref 0.0–0.1)
Basophils Relative: 0 % (ref 0–1)
EOS PCT: 2 % (ref 0–5)
Eosinophils Absolute: 0.3 10*3/uL (ref 0.0–0.7)
LYMPHS ABS: 3.1 10*3/uL (ref 0.7–4.0)
LYMPHS PCT: 15 % (ref 12–46)
Monocytes Absolute: 1.3 10*3/uL — ABNORMAL HIGH (ref 0.1–1.0)
Monocytes Relative: 6 % (ref 3–12)
NEUTROS ABS: 15.5 10*3/uL — AB (ref 1.7–7.7)
NEUTROS PCT: 77 % (ref 43–77)

## 2015-02-16 LAB — CBC
HEMATOCRIT: 45.4 % (ref 39.0–52.0)
HEMOGLOBIN: 15.3 g/dL (ref 13.0–17.0)
MCH: 30.8 pg (ref 26.0–34.0)
MCHC: 33.7 g/dL (ref 30.0–36.0)
MCV: 91.5 fL (ref 78.0–100.0)
Platelets: 159 10*3/uL (ref 150–400)
RBC: 4.96 MIL/uL (ref 4.22–5.81)
RDW: 13.5 % (ref 11.5–15.5)
WBC: 20.9 10*3/uL — AB (ref 4.0–10.5)

## 2015-02-16 LAB — COMPREHENSIVE METABOLIC PANEL
ALBUMIN: 3.1 g/dL — AB (ref 3.5–5.0)
ALT: 9 U/L — ABNORMAL LOW (ref 17–63)
ANION GAP: 6 (ref 5–15)
AST: 22 U/L (ref 15–41)
Alkaline Phosphatase: 61 U/L (ref 38–126)
BILIRUBIN TOTAL: 0.4 mg/dL (ref 0.3–1.2)
BUN: 9 mg/dL (ref 6–20)
CHLORIDE: 108 mmol/L (ref 101–111)
CO2: 28 mmol/L (ref 22–32)
Calcium: 8.3 mg/dL — ABNORMAL LOW (ref 8.9–10.3)
Creatinine, Ser: 1.06 mg/dL (ref 0.61–1.24)
GFR calc Af Amer: >60 mL/min (ref >60)
Glucose, Bld: 112 mg/dL — ABNORMAL HIGH (ref 65–99)
POTASSIUM: 3.4 mmol/L — AB (ref 3.5–5.1)
Sodium: 142 mmol/L (ref 135–145)
TOTAL PROTEIN: 6.3 g/dL — AB (ref 6.5–8.1)

## 2015-02-16 LAB — URINALYSIS, ROUTINE W REFLEX MICROSCOPIC
BILIRUBIN URINE: NEGATIVE
GLUCOSE, UA: NEGATIVE mg/dL
HGB URINE DIPSTICK: NEGATIVE
Ketones, ur: NEGATIVE mg/dL
Leukocytes, UA: NEGATIVE
Nitrite: NEGATIVE
Protein, ur: NEGATIVE mg/dL
SPECIFIC GRAVITY, URINE: 1.013 (ref 1.005–1.030)
UROBILINOGEN UA: 1 mg/dL (ref 0.0–1.0)
pH: 6.5 (ref 5.0–8.0)

## 2015-02-16 LAB — BASIC METABOLIC PANEL
ANION GAP: 7 (ref 5–15)
BUN: 13 mg/dL (ref 6–20)
CALCIUM: 8.9 mg/dL (ref 8.9–10.3)
CO2: 30 mmol/L (ref 22–32)
Chloride: 100 mmol/L — ABNORMAL LOW (ref 101–111)
Creatinine, Ser: 1.26 mg/dL — ABNORMAL HIGH (ref 0.61–1.24)
GFR calc Af Amer: >60 mL/min (ref >60)
Glucose, Bld: 99 mg/dL (ref 65–99)
POTASSIUM: 3.4 mmol/L — AB (ref 3.5–5.1)
SODIUM: 137 mmol/L (ref 135–145)

## 2015-02-16 LAB — I-STAT TROPONIN, ED: TROPONIN I, POC: 0 ng/mL (ref 0.00–0.08)

## 2015-02-16 LAB — I-STAT CG4 LACTIC ACID, ED: LACTIC ACID, VENOUS: 2.84 mmol/L — AB (ref 0.5–2.0)

## 2015-02-16 LAB — INFLUENZA PANEL BY PCR (TYPE A & B)
H1N1FLUPCR: NOT DETECTED
INFLAPCR: NEGATIVE
Influenza B By PCR: NEGATIVE

## 2015-02-16 LAB — LACTIC ACID, PLASMA
LACTIC ACID, VENOUS: 1.1 mmol/L (ref 0.5–2.0)
LACTIC ACID, VENOUS: 1 mmol/L (ref 0.5–2.0)

## 2015-02-16 LAB — RAPID STREP SCREEN (MED CTR MEBANE ONLY): STREPTOCOCCUS, GROUP A SCREEN (DIRECT): NEGATIVE

## 2015-02-16 MED ORDER — SODIUM CHLORIDE 0.9 % IJ SOLN
3.0000 mL | Freq: Two times a day (BID) | INTRAMUSCULAR | Status: DC
  Administered 2015-02-17: 3 mL via INTRAVENOUS

## 2015-02-16 MED ORDER — SODIUM CHLORIDE 0.9 % IV SOLN
INTRAVENOUS | Status: AC
  Administered 2015-02-16 (×2): via INTRAVENOUS

## 2015-02-16 MED ORDER — NICOTINE 14 MG/24HR TD PT24
14.0000 mg | MEDICATED_PATCH | Freq: Every day | TRANSDERMAL | Status: DC
  Administered 2015-02-16 – 2015-02-17 (×2): 14 mg via TRANSDERMAL
  Filled 2015-02-16 (×2): qty 1

## 2015-02-16 MED ORDER — ENOXAPARIN SODIUM 40 MG/0.4ML ~~LOC~~ SOLN
40.0000 mg | SUBCUTANEOUS | Status: DC
  Administered 2015-02-16: 40 mg via SUBCUTANEOUS
  Filled 2015-02-16: qty 0.4

## 2015-02-16 MED ORDER — ACETAMINOPHEN 650 MG RE SUPP: 650.0000 mg | Freq: Four times a day (QID) | RECTAL | Status: DC | PRN

## 2015-02-16 MED ORDER — LISINOPRIL 20 MG PO TABS
20.0000 mg | ORAL_TABLET | Freq: Every day | ORAL | Status: DC
  Administered 2015-02-16 – 2015-02-17 (×2): 20 mg via ORAL
  Filled 2015-02-16 (×2): qty 1

## 2015-02-16 MED ORDER — ACETAMINOPHEN 325 MG PO TABS
650.0000 mg | ORAL_TABLET | Freq: Four times a day (QID) | ORAL | Status: DC | PRN
  Administered 2015-02-16: 650 mg via ORAL
  Filled 2015-02-16: qty 2

## 2015-02-16 MED ORDER — METOPROLOL SUCCINATE ER 25 MG PO TB24
25.0000 mg | ORAL_TABLET | Freq: Every day | ORAL | Status: DC
  Administered 2015-02-16 – 2015-02-17 (×2): 25 mg via ORAL
  Filled 2015-02-16 (×2): qty 1

## 2015-02-16 MED ORDER — SODIUM CHLORIDE 0.9 % IV SOLN: INTRAVENOUS | Status: DC

## 2015-02-16 MED ORDER — SODIUM CHLORIDE 0.9 % IV BOLUS (SEPSIS)
1000.0000 mL | Freq: Once | INTRAVENOUS | Status: AC
  Administered 2015-02-16: 1000 mL via INTRAVENOUS

## 2015-02-16 MED ORDER — ELVITEG-COBIC-EMTRICIT-TENOFAF 150-150-200-10 MG PO TABS
1.0000 | ORAL_TABLET | Freq: Every day | ORAL | Status: DC
  Administered 2015-02-16 – 2015-02-17 (×2): 1 via ORAL
  Filled 2015-02-16 (×4): qty 1

## 2015-02-16 MED ORDER — TRAMADOL HCL 50 MG PO TABS
50.0000 mg | ORAL_TABLET | Freq: Four times a day (QID) | ORAL | Status: DC | PRN
  Administered 2015-02-16 – 2015-02-17 (×2): 50 mg via ORAL
  Filled 2015-02-16 (×2): qty 1

## 2015-02-16 MED ORDER — SODIUM CHLORIDE 0.9 % IV SOLN
INTRAVENOUS | Status: DC
  Administered 2015-02-16: 09:00:00 via INTRAVENOUS

## 2015-02-16 NOTE — ED Notes (Signed)
Admitting at bedside 

## 2015-02-16 NOTE — Progress Notes (Signed)
Report called to ED  

## 2015-02-16 NOTE — Progress Notes (Signed)
FPTS Interim Progress Note  S: Afternoon check, pt reports feeling okay currently. Originally came in for on/off fevers (says highest he measured overnight was 100.3) and he feels like this is continuing, with feeling hot then cold. He is ambulating well and no difficulty with breathing currently. Denies pain.  O: BP 153/86 mmHg  Pulse 77  Temp(Src) 98.3 F (36.8 C) (Oral)  Resp 16  Ht 6' (1.829 m)  Wt 181 lb 7.7 oz (82.32 kg)  BMI 24.61 kg/m2  SpO2 100%   General: NAD CV: RRR, nl s1s2, no m/r/g. 2+ radial pulses bilaterally Resp: CTAB, normal effort Ext: no edema  A/P: 39 y.o. male presenting with URI symptoms and leukocytosis with WBC 20. PMH significant for HIV, hep B, syphilis, chlamydia for which he has been treated. Currently observing off antibiotics, continuing HIV medications (recent CD4 count 02/14/15 was normal at 1360). Obtaining CT head for concern of asymmetric pupils (see attending note).  Nani Ravens, MD 02/16/2015, 4:27 PM PGY-3, [facility] Service pager [phone]

## 2015-02-16 NOTE — ED Notes (Signed)
Patient with cold symptoms and having chest pain that started yesterday.  Patient states that it continued into the night.  Patient denies any shortness of breath.  He has had fever and cough.

## 2015-02-16 NOTE — Progress Notes (Signed)
Phillip Gill is a 39 y.o. male patient admitted from ED awake, alert - oriented  X 4 - no acute distress noted.  VSS - Blood pressure 153/86, pulse 77, temperature 98.3 F (36.8 C), temperature source Oral, resp. rate 16, height 6' (1.829 m), weight 82.32 kg (181 lb 7.7 oz), SpO2 100 %.    IV in place, occlusive dsg intact without redness. Tele box #1 in place- NSR   Orientation to room, and floor completed with information packet given to patient/family. Admission INP armband ID verified with patient/family, and in place.   SR up x 2, fall assessment complete, with patient and family able to verbalize understanding of risk associated with falls, and verbalized understanding to call nsg before up out of bed.  Call light within reach, patient able to voice, and demonstrate understanding.  Skin, clean-dry- intact without evidence of bruising, or skin tears.   No evidence of skin break down noted on exam.     Will cont to eval and treat per MD orders.  Al Decant, [place] 02/16/2015 4:03 PM

## 2015-02-16 NOTE — H&P (Signed)
Family Medicine Teaching [facility] Admission History and Physical Service Pager: [phone]  Patient name: Phillip Gill Medical record number: [id number] Date of birth: 22-Jun-1976 Age: 39 y.o. Gender: male  Primary Care Provider: Clare Gandy, MD Consultants: None Code Status: Full  Chief Complaint: Cough and Congestion for Three Days.   Assessment and Plan: Phillip Gill is a 39 y.o. male presenting with Cough productive of clear sputum, Congestion for the past 3 days. PMH is significant for HIV (well controlled on Genvoya last CD4 2 days ago 1360). 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   # Leukocytosis - SIRS negative, Qsofa negative, Vital signs have been stable. CBC with WBC of 20 and Neutrophil predominant in the setting of HIV with adequate CD4. CXR negative for pneumonia. U/A negative. His history is mostly consistent with viral upper respiratory infection. Physical examination was unrevealing. No lymphadenopathy, and no genital lesions at this time. Additionally, he had recent laboratory screening with ID for follow up and had a negative RPR which I suspect is why titers were not reported, negative Chlamydia, and negative Gonorrhea. He has no meningitic signs or symptoms at this point. Differential diagnosis includes viral URI vs. Occult bacterial infection (less likely).  - Will admit for observation overnight. Dr. Judithann Graves attending.  - Hydration with IV fluids. S/P 2 L NS in the ED.  - Trend CBC  - Will get flu panel.  - Blood Cultures, Urine Cultures drawn in the ED. Will follow.  - Trend Lactate.  - Monitor vitals closely especially fever curve.  - Will discuss with ID given that he follows with Dr. Luciana Axe.   # Hyperlactatemia - Lactate of 2.8, with anion gap corrected to 8.25 and bicarb of 28. Creatinine off 1.06. LFT's  Are normal and bilirubin is 0.4. WBC as above without  thrombocytopenia which he has had before. No evidence of infection anywhere as above. Pt. Has had poor appetite lately and has been eating / drinking less. Of note, he did recently start Genvoya 4 months ago, though he has tolerated this medication well. This may be contributing to a chronic lactic acidosis picture, but less likely. No evidence of hypoperfusion or end organ dysfunction. Differential includes dehydration, chronic hyperlactatemia, or bacterial infection.  - Observation as above.  - Given 2L NS in the ED - Will start MIVF with NS.  - Trending Lactic Acid.  - Monitor vital signs closely. If he becomes tachycardic or hypotensive will certainly treat as indicated.   # Hepatitis B - This is a chronic problem for him. He has preserved hepatic function at this point with normal platelets, normal LFT's, normal Bili, protein normal. Lipids are appropriate. CT Abdomen 12/2014 without evidence of fibrosis or structural abnormality. Has been on Tenofovir with HAART therapy.  -  Continue Genvoya here  - CMP within normal limits.  - Infection controlled at this point.   # HIV - Pt. Has had this for 3 years. He is currently treated with Genvoya after switching from his prior regimen in 10/2014 during hospitalization with impaired renal function. Last CD4 1360. He is followed by ID as an outpatient. He says he is compliant with medications, though he missed one dose yesterday he says this rarely happens. He has not had any side effects from the medications at this point. Lipids are normal. His other labs are unremarkable.  - Continue Genvoya for now.  - Will discuss  with ID regarding his admission.  - monitoring CBC for WBC as above.   # Hx of Primary Syhpilis and Chlamydia -  Pt. With hx of these infections in 10/2014. He has been treated and recent results were negative including gonorrhea, chlamydia, and RPR. No evidence of LGV on exam. He continues to have sex with men and says that he uses condoms.  No oral lesions or symptoms at this time, and no external exam findings.  - Consider syphilis titer since I suspect that it was not sent due to negative RPR as this is a reflex lab.  - Insure that his partners have been treated.   # HTN - Pt. On Lisinopril and metoprolol as an outpatient. Blood pressure appropriate here.  - Continue home meds for  Now.    FEN/GI: Regular Diet. MIVF. Stop IVF after 12 hours.  Prophylaxis: Lovenox.   Disposition: Observation overnight, and dispo pending clinical stability and improvement in laboratory values.   History of Present Illness: Phillip Gill is a 39 y.o. male presenting with cold / congestion symptoms x 3 days. He says that he has had congestion, nasal discharge, and post nasal drip for the past 3 days. He has not had any shortness of breath. He has had chest pain, reproducible to palpation intermittently during that time as well, though no diaphoresis, nausea, neck pain, jaw pain, or arm pain. His chest pain does not radiate. He says that he took his temperature at home and it was 100F and then 24 F right before he came to the ED. He has not had any shortness of breath. He denies nausea, vomiting, diarrhea, constipation, genital lesions, or any other skin lesions. He has not had vision changes, headache, or neck stiffness. He has no abdominal pain.He says that he just felt like he was sick and thought he should get checked out. He has HIV and he says he missed his dose of HAART yesterday, but this is very rare for him. He does continue to smoke 1.5 packs / 2 days. He denies ETOH or substance abuse. He is a MSM who uses protection intermittently. He says that he has oral and anal sex. He has not had any new oral lesions, or pain with swallowing, or difficulty swallowing. He does endorse some decreased appetite over the past week.   Review Of Systems: Per HPI with the following additions: none Otherwise 12 point review of systems was performed and was  unremarkable.  Patient Active Problem List   Diagnosis Date Noted  . Lactic acid acidosis 02/16/2015  . Rash and nonspecific skin eruption 02/02/2015  . LGV (lymphogranuloma venereum)   . Primary syphilis   . HIV disease   . 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 Penile ulcer 10/03/2014  . Inguinal adenopathy 10/03/2014  . Human immunodeficiency virus (HIV) disease   . Essential hypertension   . Gynecomastia 04/30/2014  . 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 Tobacco dependence 06/03/2012  . Fibromyalgia muscle pain 02/11/2012  . Depression 12/07/2011  . Anxiety 02/02/2011   Past Medical History: Past Medical History  Diagnosis Date  . Anxiety   . Depression   . HIV infection     followed by Dr. Luciana Axe- sees him every 4 months  . Hypertension   . Anxiety   . Arthritis   . 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 Migraine   . IBS (irritable bowel syndrome)   . Renal insufficiency 12/29/2013   Past  Surgical History: Past Surgical History  Procedure Laterality Date  . Wisdom tooth extraction    . High resolution anoscopy N/A 02/11/2013    Procedure: HIGH RESOLUTION ANOSCOPY WITH BIOPSY, LASER ABLATION;  Surgeon: Romie Levee, MD;  Location: [facility];  Service: General;  Laterality: N/A;  . Co2 laser application N/A 02/11/2013    Procedure: CO2 LASER APPLICATION;  Surgeon: Romie Levee, MD;  Location: [facility];  Service: General;  Laterality: N/A;   Social History: Social History  Substance Use Topics  . Smoking status: Current Every Day Smoker -- 0.50 packs/day for 15 years    Types: Cigarettes  . Smokeless tobacco: Never Used     Comment: may try something to quit  . Alcohol Use: Yes     Comment: occasional   Additional social history: See HPI  Please also refer to relevant sections of EMR.  Family History: Family History  Problem Relation Age of Onset  . Mental illness Neg Hx   . Hypertension Mother   . Diabetes Mother   . Stroke  Mother     cerbral aneurysm  . Hypertension Brother    Allergies and Medications: Allergies  Allergen Reactions  . Bactrim [Sulfamethoxazole W/Trimethoprim (Co-Trimoxazole)] Hives and Shortness Of Breath  . Cymbalta [Duloxetine Hcl] Diarrhea  . Other Hives and Swelling    [employer] toothpaste   . Zoloft [Sertraline Hcl] Diarrhea  . Neurontin [Gabapentin] Rash   No current facility-administered medications on file prior to encounter.   Current Outpatient Prescriptions on File Prior to Encounter  Medication Sig Dispense Refill  . elvitegravir-cobicistat-emtricitabine-tenofovir (GENVOYA) 150-150-200-10 MG TABS tablet Take 1 tablet by mouth daily with breakfast. 30 tablet 11  . imiquimod (ALDARA) 5 % cream APPLY TO THE AFFECTED AREAS THREE TIMES PER WEEK 12 each 1  . lisinopril (PRINIVIL,ZESTRIL) 20 MG tablet TAKE 1 TABLET BY MOUTH EVERY DAY 30 tablet 6  . metoprolol succinate (TOPROL-XL) 25 MG 24 hr tablet Take 1 tablet (25 mg total) by mouth daily. 30 tablet 11  . HYDROcodone-acetaminophen (NORCO) 5-325 MG per tablet Take 1 tablet by mouth every 6 (six) hours as needed for moderate pain. (Patient not taking: Reported on 10/20/2014) 30 tablet 0  . loperamide (IMODIUM) 2 MG capsule Take 1 capsule (2 mg total) by mouth 4 (four) times daily as needed for diarrhea or loose stools. (Patient not taking: Reported on 02/16/2015) 12 capsule 0  . sodium chloride (OCEAN) 0.65 % SOLN nasal spray Place 1 spray into both nostrils as needed for congestion. (Patient not taking: Reported on 02/16/2015) 30 mL 0  . [DISCONTINUED] gabapentin (NEURONTIN) 300 MG capsule Take 1 capsule (300 mg total) by mouth 3 (three) times daily. 90 capsule 3    Objective: BP 139/88 mmHg  Pulse 74  Temp(Src) 98.2 F (36.8 C)  Resp 22  Ht 6' (1.829 m)  Wt 173 lb (78.472 kg)  BMI 23.46 kg/m2  SpO2 100% Exam: General: NAD, AAOx3, Resting comfortably in bed.  Eyes: EOMI, PERRLA ENTM: Ears TM's normal, nares patent, throat /  O/P clear without lesions, erythema, and lips without lesions and normal in appearance.  Neck: No LAD, No thyromegaly, FROM Cardiovascular: RRR, No MGR, Normal S1/S2. TTP over his mid-sternum.  Respiratory: CTA Bilaterally, appropriate rate, unlabored.  Abdomen: S, NT, ND, +BS, no organomegaly palpable. No masses.  GU: No evidence of lesions, or skin breakdown on the penis or testicles, no lesions in the perineum, no dishcarge, no erythema. No evidence of  infection at this time. LGV appears to be resolved.  MSK: MAEW, no TTP.  Skin: No areas of skin breakdown, erythema, or signs of infection. No rashes, no lesions.  Neuro: No focal deficits noted, AAOx3 Psych: Appropriate mood / affect.   Labs and Imaging: CBC BMET   Recent Labs Lab 02/16/15 0450  WBC 20.9*  HGB 15.3  HCT 45.4  PLT 159    Recent Labs Lab 02/16/15 0450  NA 137  K 3.4*  CL 100*  CO2 30  BUN 13  CREATININE 1.26*  GLUCOSE 99  CALCIUM 8.9     Results for orders placed or performed during the hospital encounter of 02/16/15 (from the past 24 hour(s))  Basic metabolic panel     Status: Abnormal   Collection Time: 02/16/15  4:50 AM  Result Value Ref Range   Sodium 137 135 - 145 mmol/L   Potassium 3.4 (L) 3.5 - 5.1 mmol/L   Chloride 100 (L) 101 - 111 mmol/L   CO2 30 22 - 32 mmol/L   Glucose, Bld 99 65 - 99 mg/dL   BUN 13 6 - 20 mg/dL   Creatinine, Ser 2.67 (H) 0.61 - 1.24 mg/dL   Calcium 8.9 8.9 - 12.4 mg/dL   GFR calc non Af Amer >60 >60 mL/min   GFR calc Af Amer >60 >60 mL/min   Anion gap 7 5 - 15  CBC     Status: Abnormal   Collection Time: 02/16/15  4:50 AM  Result Value Ref Range   WBC 20.9 (H) 4.0 - 10.5 K/uL   RBC 4.96 4.22 - 5.81 MIL/uL   Hemoglobin 15.3 13.0 - 17.0 g/dL   HCT 58.0 99.8 - 33.8 %   MCV 91.5 78.0 - 100.0 fL   MCH 30.8 26.0 - 34.0 pg   MCHC 33.7 30.0 - 36.0 g/dL   RDW 25.0 53.9 - 76.7 %   Platelets 159 150 - 400 K/uL  Differential     Status: Abnormal   Collection Time:  02/16/15  4:50 AM  Result Value Ref Range   Neutrophils Relative % 77 43 - 77 %   Neutro Abs 15.5 (H) 1.7 - 7.7 K/uL   Lymphocytes Relative 15 12 - 46 %   Lymphs Abs 3.1 0.7 - 4.0 K/uL   Monocytes Relative 6 3 - 12 %   Monocytes Absolute 1.3 (H) 0.1 - 1.0 K/uL   Eosinophils Relative 2 0 - 5 %   Eosinophils Absolute 0.3 0.0 - 0.7 K/uL   Basophils Relative 0 0 - 1 %   Basophils Absolute 0.0 0.0 - 0.1 K/uL  I-stat troponin, ED     Status: None   Collection Time: 02/16/15  5:03 AM  Result Value Ref Range   Troponin i, poc 0.00 0.00 - 0.08 ng/mL   Comment 3          I-Stat CG4 Lactic Acid, ED     Status: Abnormal   Collection Time: 02/16/15  6:40 AM  Result Value Ref Range   Lactic Acid, Venous 2.84 (HH) 0.5 - 2.0 mmol/L   Comment NOTIFIED PHYSICIAN   Rapid strep screen     Status: None   Collection Time: 02/16/15  7:10 AM  Result Value Ref Range   Streptococcus, Group A Screen (Direct) NEGATIVE NEGATIVE  Urinalysis, Routine w reflex microscopic (not at [facility])     Status: Abnormal   Collection Time: 02/16/15  8:16 AM  Result Value Ref Range   Color, Urine  YELLOW YELLOW   APPearance HAZY (A) CLEAR   Specific Gravity, Urine 1.013 1.005 - 1.030   pH 6.5 5.0 - 8.0   Glucose, UA NEGATIVE NEGATIVE mg/dL   Hgb urine dipstick NEGATIVE NEGATIVE   Bilirubin Urine NEGATIVE NEGATIVE   Ketones, ur NEGATIVE NEGATIVE mg/dL   Protein, ur NEGATIVE NEGATIVE mg/dL   Urobilinogen, UA 1.0 0.0 - 1.0 mg/dL   Nitrite NEGATIVE NEGATIVE   Leukocytes, UA NEGATIVE NEGATIVE  Lactic acid, plasma     Status: None   Collection Time: 02/16/15  9:27 AM  Result Value Ref Range   Lactic Acid, Venous 1.1 0.5 - 2.0 mmol/L  Comprehensive metabolic panel     Status: Abnormal   Collection Time: 02/16/15  9:27 AM  Result Value Ref Range   Sodium 142 135 - 145 mmol/L   Potassium 3.4 (L) 3.5 - 5.1 mmol/L   Chloride 108 101 - 111 mmol/L   CO2 28 22 - 32 mmol/L   Glucose, Bld 112 (H) 65 - 99 mg/dL   BUN 9 6 - 20  mg/dL   Creatinine, Ser 1.61 0.61 - 1.24 mg/dL   Calcium 8.3 (L) 8.9 - 10.3 mg/dL   Total Protein 6.3 (L) 6.5 - 8.1 g/dL   Albumin 3.1 (L) 3.5 - 5.0 g/dL   AST 22 15 - 41 U/L   ALT 9 (L) 17 - 63 U/L   Alkaline Phosphatase 61 38 - 126 U/L   Total Bilirubin 0.4 0.3 - 1.2 mg/dL   GFR calc non Af Amer >60 >60 mL/min   GFR calc Af Amer >60 >60 mL/min   Anion gap 6 5 - 15  Lactic acid, plasma     Status: None   Collection Time: 02/16/15 12:22 PM  Result Value Ref Range   Lactic Acid, Venous 1.0 0.5 - 2.0 mmol/L     Yolande Jolly, MD 02/16/2015, 9:47 AM PGY-2, [facility] FPTS Intern pager: [phone], text pages welcome

## 2015-02-17 ENCOUNTER — Encounter (HOSPITAL_COMMUNITY): Payer: Self-pay | Admitting: *Deleted

## 2015-02-17 DIAGNOSIS — D72829 Elevated white blood cell count, unspecified: Secondary | ICD-10-CM | POA: Diagnosis not present

## 2015-02-17 DIAGNOSIS — E872 Acidosis: Secondary | ICD-10-CM

## 2015-02-17 DIAGNOSIS — R509 Fever, unspecified: Secondary | ICD-10-CM | POA: Diagnosis not present

## 2015-02-17 DIAGNOSIS — J069 Acute upper respiratory infection, unspecified: Principal | ICD-10-CM

## 2015-02-17 LAB — COMPREHENSIVE METABOLIC PANEL
ALBUMIN: 3.3 g/dL — AB (ref 3.5–5.0)
ALK PHOS: 64 U/L (ref 38–126)
ALT: 10 U/L — ABNORMAL LOW (ref 17–63)
ANION GAP: 8 (ref 5–15)
AST: 23 U/L (ref 15–41)
BUN: <5 mg/dL — ABNORMAL LOW (ref 6–20)
CO2: 25 mmol/L (ref 22–32)
Calcium: 8.9 mg/dL (ref 8.9–10.3)
Chloride: 108 mmol/L (ref 101–111)
Creatinine, Ser: 0.97 mg/dL (ref 0.61–1.24)
GFR calc Af Amer: >60 mL/min (ref >60)
GFR calc non Af Amer: >60 mL/min (ref >60)
GLUCOSE: 89 mg/dL (ref 65–99)
POTASSIUM: 4.2 mmol/L (ref 3.5–5.1)
SODIUM: 141 mmol/L (ref 135–145)
Total Bilirubin: 0.3 mg/dL (ref 0.3–1.2)
Total Protein: 6.8 g/dL (ref 6.5–8.1)

## 2015-02-17 LAB — URINE CULTURE: Culture: 3000

## 2015-02-17 LAB — CBC
HEMATOCRIT: 42.3 % (ref 39.0–52.0)
HEMOGLOBIN: 14.1 g/dL (ref 13.0–17.0)
MCH: 30.7 pg (ref 26.0–34.0)
MCHC: 33.3 g/dL (ref 30.0–36.0)
MCV: 92.2 fL (ref 78.0–100.0)
Platelets: 137 10*3/uL — ABNORMAL LOW (ref 150–400)
RBC: 4.59 MIL/uL (ref 4.22–5.81)
RDW: 13.5 % (ref 11.5–15.5)
WBC: 15.4 10*3/uL — ABNORMAL HIGH (ref 4.0–10.5)

## 2015-02-17 LAB — C DIFFICILE QUICK SCREEN W PCR REFLEX
C Diff antigen: NEGATIVE
C Diff interpretation: NEGATIVE
C Diff toxin: NEGATIVE

## 2015-02-17 LAB — CULTURE, GROUP A STREP: Strep A Culture: POSITIVE — AB

## 2015-02-17 NOTE — Care Management Note (Signed)
Case Management Note  Patient Details  Name: Phillip Gill MRN: [id number] Date of Birth: Jul 10, 1975  Subjective/Objective:                 Spoke with patient, states he lives at home alone with his two dogs. He denies any barriers to medications, going to MD appointments, or self care. Patient is insured, has PCP, on Genvoya prior to admission.    Action/Plan:  Will continue to follow and offer assistance as needed.  Expected Discharge Date:                  Expected Discharge Plan:  Home/Self Care  In-House Referral:     Discharge planning Services  CM Consult  Post Acute Care Choice:    Choice offered to:     DME Arranged:    DME Agency:     HH Arranged:    HH Agency:     Status of Service:  In process, will continue to follow  Medicare Important Message Given:    Date Medicare IM Given:    Medicare IM give by:    Date Additional Medicare IM Given:    Additional Medicare Important Message give by:     If discussed at Long Length of Stay Meetings, dates discussed:    Additional Comments:  [name], RN 02/17/2015, 11:18 AM

## 2015-02-17 NOTE — Discharge Summary (Signed)
Family Medicine Teaching [facility] Discharge Summary  Patient name: Phillip Gill Medical record number: [id number] Date of birth: 19-Sep-1975 Age: 39 y.o. Gender: male Date of Admission: 02/16/2015  Date of Discharge: 8/18 Admitting Physician: Latrelle Dodrill, MD  Primary Care Provider: Clare Gandy, MD Consultants: none  Indication for Hospitalization: Leukocytosis and Hyperlacatemia   Discharge Diagnoses/Problem List:  Patient Active Problem List   Diagnosis Date Noted  . Lactic acid acidosis 02/16/2015  . Leukocytosis   . URI (upper respiratory infection)   . HIV (human immunodeficiency virus infection)   . Rash and nonspecific skin eruption 02/02/2015  . LGV (lymphogranuloma venereum)   . Primary syphilis   . HIV disease   . 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 Penile ulcer 10/03/2014  . Inguinal adenopathy 10/03/2014  . Human immunodeficiency virus (HIV) disease   . Essential hypertension   . Gynecomastia 04/30/2014  . 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 Tobacco dependence 06/03/2012  . Fibromyalgia muscle pain 02/11/2012  . Depression 12/07/2011  . Anxiety 02/02/2011    Disposition: home  Discharge Condition: stable  Discharge Exam:  General: NAD lying in bed Cardiovascular: RRR Respiratory: CTAB, non-labored breathing  Abdomen: soft, ND, NT, +bs  Neuro: Alert and oriented. CN II-XII grossly intact.   Brief Hospital Course:  Phillip Gill is a 39 y.o. male who presented to the ED on 8/17 with cough, congestion x3 days. Medical Hx significant for HIV, CD4 currently stable, and Hx of Syphilis & Chlamydia 4 months ago, treated and resolved. Upon admission, patient had CBC with WBC of 20 and lactate of 2.8. CXR, U/A, and Flu were negative. Blood cultures no growth x1 day. Urine culture unremarkable. Patient did have new onset diarrhea on the morning of 8/18. C diff toxin was ordered, but patient had no further diarrhea for collection. Patient was afebrile  throughout hospital stay, and PE was unremarkable. WBC trended down to 15.4 and lactate normalized to 1.0.   Discussed possibility of discharge with patient. He was very agreeable to plan of following up with FM and ID outpatient. Given leukocytosis, patient was worked up for infection and no obvious causes of the elevated WBC were discovered.   Issues for Follow Up:  1. Leukocytosis: Improving at time of discharge with no evident causes for increase in WBC at admission. ID to follow up outpatient given patient's hx of HIV, Hep B, chlamydia and syphilis. Recommend follow up CBC.  2. 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 Negative CT head during hospital stay. Can be followed outpatient.   Significant Procedures: none   Significant Labs and Imaging:   Recent Labs Lab 02/14/15 1220 02/16/15 0450 02/17/15 0553  WBC 11.6* 20.9* 15.4*  HGB 14.6 15.3 14.1  HCT 42.2 45.4 42.3  PLT 176 159 137*    Recent Labs Lab 02/14/15 1220 02/16/15 0450 02/16/15 0927 02/17/15 0553  NA 144 137 142 141  K 3.6 3.4* 3.4* 4.2  CL 103 100* 108 108  CO2 GLUCOSE 72 99 112* 89  BUN <5*  CREATININE 1.16 1.26* 1.06 0.97  CALCIUM 9.2 8.9 8.3* 8.9  ALKPHOS 62  --  61 64  AST 20  --  22 23  ALT 9  --  9* 10*  ALBUMIN 4.0  --  3.1* 3.3*    Dg Chest 2 View  02/16/2015   CLINICAL DATA:  Chest pain, cough, fever  EXAM: CHEST  2 VIEW  COMPARISON:  10/02/2014  FINDINGS: Prominent markings at the medial right base are stable and normal for this patient based on chest CT 10/05/2014. There is no edema, consolidation, effusion, or pneumothorax. Normal heart size and mediastinal contours.  IMPRESSION: No active cardiopulmonary disease.   Electronically Signed   By: Marnee Spring M.D.   On: 02/16/2015 05:44   Ct Head Wo Contrast  02/16/2015   CLINICAL DATA:  Leukocytosis, upper respiratory infection, HIV  EXAM: CT HEAD WITHOUT CONTRAST  TECHNIQUE: Contiguous axial images were obtained from  the base of the skull through the vertex without intravenous contrast.  COMPARISON:  07/13/2011  FINDINGS: No skull fracture is noted. Paranasal sinuses and mastoid air cells are unremarkable. No intracranial hemorrhage, mass effect or midline shift. No acute cortical infarction. No mass lesion is noted on this unenhanced scan. The gray and white-matter differentiation is preserved. No hydrocephalus. No intra or extra-axial fluid collection.  IMPRESSION: No acute intracranial abnormality.   Electronically Signed   By: Natasha Mead M.D.   On: 02/16/2015 16:58    Results/Tests Pending at Time of Discharge: C diff toxin never collected due to lack of further diarrhea  Discharge Medications:    Medication List    ASK your doctor about these medications        elvitegravir-cobicistat-emtricitabine-tenofovir 150-150-200-10 MG Tabs tablet  Commonly known as:  GENVOYA  Take 1 tablet by mouth daily with breakfast.     HYDROcodone-acetaminophen 5-325 MG per tablet  Commonly known as:  NORCO  Take 1 tablet by mouth every 6 (six) hours as needed for moderate pain.     imiquimod 5 % cream  Commonly known as:  ALDARA  APPLY TO THE AFFECTED AREAS THREE TIMES PER WEEK     lisinopril 20 MG tablet  Commonly known as:  PRINIVIL,ZESTRIL  TAKE 1 TABLET BY MOUTH EVERY DAY     loperamide 2 MG capsule  Commonly known as:  IMODIUM  Take 1 capsule (2 mg total) by mouth 4 (four) times daily as needed for diarrhea or loose stools.     metoprolol succinate 25 MG 24 hr tablet  Commonly known as:  TOPROL-XL  Take 1 tablet (25 mg total) by mouth daily.     sodium chloride 0.65 % Soln nasal spray  Commonly known as:  OCEAN  Place 1 spray into both nostrils as needed for congestion.        Discharge Instructions: Please refer to Patient Instructions section of EMR for full details.  Patient was counseled important signs and symptoms that should prompt return to medical care, changes in medications, dietary  instructions, activity restrictions, and follow up appointments.   Follow-Up Appointments:  Follow-up Information    Follow up with [facility]. Go on 02/23/2015.   Specialty:  Family Medicine   Why:  For Hospital Followup; Dr. Jordan Likes at 8:45am, For Hospital Followup   Contact information:   [address] [id number] mc [place] [id number] [phone]      Arvilla Market, DO 02/17/2015, 3:26 PM PGY-1, [facility]

## 2015-02-17 NOTE — Discharge Instructions (Signed)
1. Follow up with Dr. Jordan Likes at the [facility] 2. Follow up with your Infectious Disease doctors 3. If you have uncontrolled diarrhea, high fevers, or cough with blood go to the Emergency Department.

## 2015-02-17 NOTE — Progress Notes (Signed)
Family Medicine Teaching Service Daily Progress Note Intern Pager: [phone]  Patient name: Phillip Gill Medical record number: [id number] Date of birth: August 13, 1975 Age: 39 y.o. Gender: male  Primary Care Provider: Clare Gandy, MD Consultants: None Code Status: Full  Pt Overview and Major Events to Date:  8/17: Admit to hospital   CT Head negative for acute intracranial abnormality;  Sinuses normal    Assessment and Plan: Phillip Gill is a 39 y.o. male presenting with Cough productive of clear sputum, Congestion for the past 3 days. PMH is significant for HIV (well controlled on Genvoya last CD4 2 days ago 1360). 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   # Leukocytosis - SIRS negative, Qsofa negative, Vital signs have been stable. CBC with WBC of 20> 15.4 (8/18) and Neutrophil predominant in the setting of HIV with adequate CD4. CXR negative for pneumonia. U/A negative. Flu negative. His history is mostly consistent with viral upper respiratory infection. Physical examination was unrevealing. No lymphadenopathy, and no genital lesions at this time. Additionally, he had recent laboratory screening with ID for follow up and had a negative RPR which I suspect is why titers were not reported, negative Chlamydia, and negative Gonorrhea. He has no meningitic signs or symptoms at this point. Differential diagnosis includes viral URI vs. Occult bacterial infection (less likely).  - Trend CBC  - Blood Cultures and Urine cultures pending  - Trend Lactate.  - Monitor vitals closely especially fever curve.  - Will discuss with ID given that he follows with Dr. Luciana Axe.   #Anisocoria: R pupil larger in size than L pupil. Pt denies hx of anisocoria. Rest of neuro exam unremarkable. Due to hx of HIV, CT of head obtained 8/17) that did not show any acute intracranial abnormality.   #Diarrhea: Patient has  new onset of diarrhea since admission and abdominal pain. States that he has had 6-7 loose, runny stools. Per nursing, they have not seen any of the stools. Patient denies hematochezia. -consider C diff and stool O&P  # Hyperlactatemia - Lactate of 2.8, with anion gap corrected to 8.25 and bicarb of 28. Creatinine off 1.06. LFT's Are normal and bilirubin is 0.4. WBC as above without thrombocytopenia which he has had before. No evidence of infection anywhere as above. Pt. Has had poor appetite lately and has been eating / drinking less. Of note, he did recently start Genvoya 4 months ago, though he has tolerated this medication well. This may be contributing to a chronic lactic acidosis picture, but less likely. No evidence of hypoperfusion or end organ dysfunction. Differential includes dehydration, chronic hyperlactatemia, or bacterial infection.  - Observation as above.  - Given 2L NS in the ED - Will start MIVF with NS.  - Trending Lactic Acid.  - Monitor vital signs closely. If he becomes tachycardic or hypotensive will certainly treat as indicated.   # Hepatitis B - This is a chronic problem for him. He has preserved hepatic function at this point with normal platelets, normal LFT's, normal Bili, protein normal. Lipids are appropriate. CT Abdomen 12/2014 without evidence of fibrosis or structural abnormality. Has been on Tenofovir with HAART therapy.  - Continue Genvoya here  - CMP within normal limits.  - Infection controlled at this point.   # HIV - Pt. Has had this for 3 years. He is currently treated with Genvoya after switching from his prior regimen in 10/2014 during hospitalization  with impaired renal function. Last CD4 1360. He is followed by ID as an outpatient. He says he is compliant with medications, though he missed one dose yesterday he says this rarely happens. He has not had any side effects from the medications at this point. Lipids are normal. His other labs are  unremarkable.  - Continue Genvoya for now.  - Will discuss with ID regarding his admission.  - monitoring CBC for WBC as above.   # Hx of Primary Syhpilis and Chlamydia - Pt. With hx of these infections in 10/2014. He has been treated and recent results were negative including gonorrhea, chlamydia, and RPR. No evidence of LGV on exam. He continues to have sex with men and says that he uses condoms. No oral lesions or symptoms at this time, and no external exam findings.  - Consider syphilis titer since I suspect that it was not sent due to negative RPR as this is a reflex lab.  - Insure that his partners have been treated.   # HTN - Pt. On Lisinopril and metoprolol as an outpatient. Blood pressure appropriate here.  - Continue home meds for Now.   FEN/GI: Regular Diet.  Prophylaxis: Lovenox.     Disposition: stable   Subjective:  Patient complains of new onset abdominal pain and diarrhea. Denies N/V. Cough and URI symptoms similar to those upon admission.   Objective: Temp:  [98.3 F (36.8 C)-98.5 F (36.9 C)] 98.5 F (36.9 C) (08/18 0549) Pulse Rate:  [46-83] 81 (08/18 0549) Resp:  [15-27] 15 (08/18 0549) BP: (132-158)/(75-99) 158/94 mmHg (08/18 0549) SpO2:  [94 %-100 %] 100 % (08/18 0549) Weight:  [170 lb 12.8 oz (77.474 kg)-181 lb 7.7 oz (82.32 kg)] 170 lb 12.8 oz (77.474 kg) (08/18 0717) Physical Exam: General: NAD lying in bed Eyes: Right pupil slightly > than left  Cardiovascular: RRR Respiratory: CTAB Abdomen: soft, ND, NT, +bs  Neuro: Alert and oriented. CN II-XII grossly intact.   Laboratory:  Recent Labs Lab 02/14/15 1220 02/16/15 0450 02/17/15 0553  WBC 11.6* 20.9* 15.4*  HGB 14.6 15.3 14.1  HCT 42.2 45.4 42.3  PLT 176 159 137*    Recent Labs Lab 02/14/15 1220 02/16/15 0450 02/16/15 0927 02/17/15 0553  NA 144 137 142 141  K 3.6 3.4* 3.4* 4.2  CL 103 100* 108 108  CO2 BUN <5*  CREATININE 1.16 1.26* 1.06 0.97   CALCIUM 9.2 8.9 8.3* 8.9  PROT 7.5  --  6.3* 6.8  BILITOT 0.2  --  0.4 0.3  ALKPHOS 62  --  61 64  ALT 9  --  9* 10*  AST 20  --  22 23  GLUCOSE 72 99 112* 89     Imaging/Diagnostic Tests: Dg Chest 2 View  02/16/2015   CLINICAL DATA:  Chest pain, cough, fever  EXAM: CHEST  2 VIEW  COMPARISON:  10/02/2014  FINDINGS: Prominent markings at the medial right base are stable and normal for this patient based on chest CT 10/05/2014. There is no edema, consolidation, effusion, or pneumothorax. Normal heart size and mediastinal contours.  IMPRESSION: No active cardiopulmonary disease.   Electronically Signed   By: Marnee Spring M.D.   On: 02/16/2015 05:44   Ct Head Wo Contrast  02/16/2015   CLINICAL DATA:  Leukocytosis, upper respiratory infection, HIV  EXAM: CT HEAD WITHOUT CONTRAST  TECHNIQUE: Contiguous axial images were obtained from the base of the skull through the vertex  without intravenous contrast.  COMPARISON:  07/13/2011  FINDINGS: No skull fracture is noted. Paranasal sinuses and mastoid air cells are unremarkable. No intracranial hemorrhage, mass effect or midline shift. No acute cortical infarction. No mass lesion is noted on this unenhanced scan. The gray and white-matter differentiation is preserved. No hydrocephalus. No intra or extra-axial fluid collection.  IMPRESSION: No acute intracranial abnormality.   Electronically Signed   By: Natasha Mead M.D.   On: 02/16/2015 16:58    Arvilla Market, DO 02/17/2015, 8:05 AM PGY-1, [facility] FPTS Intern pager: [phone], text pages welcome

## 2015-02-17 NOTE — Progress Notes (Signed)
Chaplain responded to consult request. Pt was talking to a friend when Chaplain entered. Chaplain heard Pt say he wants to know what is wrong. Chaplain prayed with Pt and friend. Chaplain will be available for further neesds   02/17/15 1100  Clinical Encounter Type  Visited With Patient  Visit Type Spiritual support  Referral From Nurse  Spiritual Encounters  Spiritual Needs Prayer;Emotional

## 2015-02-17 NOTE — Progress Notes (Signed)
Debbora Lacrosse Gebert to be D/C'd Home per MD order.  Discussed with the patient and all questions fully answered.  VSS, Skin clean, dry and intact without evidence of skin break down, no evidence of skin tears noted. IV catheter discontinued intact. Site without signs and symptoms of complications. Dressing and pressure applied.  An After Visit Summary was printed and given to the patient. Patient received prescription.  D/c education completed with patient/family including follow up instructions, medication list, d/c activities limitations if indicated, with other d/c instructions as indicated by MD - patient able to verbalize understanding, all questions fully answered.   Patient instructed to return to ED, call 911, or call MD for any changes in condition.   Patient escorted via WC, and D/C home via private auto.  Beckey Downing F 02/17/2015 4:58 PM

## 2015-02-21 LAB — CULTURE, BLOOD (ROUTINE X 2)
CULTURE: NO GROWTH
Culture: NO GROWTH

## 2015-02-21 NOTE — ED Provider Notes (Signed)
CSN: [id number]     Arrival date & time 02/16/15  0440 History   First MD Initiated Contact with Patient 02/16/15 [phone]     Chief Complaint  Patient presents with  . Chest Pain     (Consider location/radiation/quality/duration/timing/severity/associated sxs/prior Treatment) HPI Comments: Patient with a history of HIV, HTN, hepatitis B presents with 1 day of not feeling well, including cough and fever. His chest hurts when he coughs and he has generalized muscle aching. No nausea or vomiting. No diarrhea. He reports is CD4 counts are healthy and he is followed by [facility].   The history is provided by the patient. No language interpreter was used.    Past Medical History  Diagnosis Date  . Anxiety   . Depression   . HIV infection     followed by Dr. Luciana Axe- sees him every 4 months  . Hypertension   . 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 IBS (irritable bowel syndrome)   . Renal insufficiency 12/29/2013  . Migraine     "none in years" (02/16/2015  . Arthritis     "neck" (02/16/2015)  . Fibromyalgia   . Chronic lower back pain    Past Surgical History  Procedure Laterality Date  . Wisdom tooth extraction    . High resolution anoscopy N/A 02/11/2013    Procedure: HIGH RESOLUTION ANOSCOPY WITH BIOPSY, LASER ABLATION;  Surgeon: Romie Levee, MD;  Location: [facility];  Service: General;  Laterality: N/A;  . Co2 laser application N/A 02/11/2013    Procedure: CO2 LASER APPLICATION;  Surgeon: Romie Levee, MD;  Location: [facility];  Service: General;  Laterality: N/A;   Family History  Problem Relation Age of Onset  . Mental illness Neg Hx   . Hypertension Mother   . Diabetes Mother   . Stroke Mother     cerbral aneurysm  . Hypertension Brother    Social History  Substance Use Topics  . Smoking status: Current Every Day Smoker -- 0.50 packs/day for 18 years    Types: Cigarettes  . Smokeless tobacco: Never Used  . Alcohol  Use: Yes     Comment: 02/16/2015 drinking varies; "I can go months without drinking then drink 6 beers, 4 glasses of wine or 3-6 shots liquor /day"    Review of Systems  Constitutional: Negative for fever and chills.  HENT: Negative.  Negative for congestion.   Eyes: Negative for discharge.  Respiratory: Positive for cough.   Cardiovascular: Positive for chest pain.  Gastrointestinal: Negative.  Negative for nausea and vomiting.  Musculoskeletal: Positive for myalgias. Negative for neck stiffness.  Skin: Negative.  Negative for rash.  Neurological: Negative.  Negative for weakness.      Allergies  Bactrim; Cymbalta; Other; Zoloft; and Neurontin  Home Medications   Prior to Admission medications   Medication Sig Start Date End Date Taking? Authorizing Provider  elvitegravir-cobicistat-emtricitabine-tenofovir (GENVOYA) 150-150-200-10 MG TABS tablet Take 1 tablet by mouth daily with breakfast. 10/20/14  Yes Randall Hiss, MD  imiquimod [facility]) 5 % cream APPLY TO THE AFFECTED AREAS THREE TIMES PER WEEK 02/14/15  Yes Gardiner Barefoot, MD  lisinopril (PRINIVIL,ZESTRIL) 20 MG tablet TAKE 1 TABLET BY MOUTH EVERY DAY 11/02/14  Yes Ginnie Smart, MD  metoprolol succinate (TOPROL-XL) 25 MG 24 hr tablet Take 1 tablet (25 mg total) by mouth daily. 08/30/14  Yes Randall Hiss, MD  HYDROcodone-acetaminophen [facility]) 5-325 MG per tablet  Take 1 tablet by mouth every 6 (six) hours as needed for moderate pain. Patient not taking: Reported on 10/20/2014 09/25/13   Garnetta Buddy, MD  sodium chloride (OCEAN) 0.65 % SOLN nasal spray Place 1 spray into both nostrils as needed for congestion. Patient not taking: Reported on 02/16/2015 01/24/15   Ladona Mow, PA-C   BP 155/91 mmHg  Pulse 85  Temp(Src) 98.4 F (36.9 C) (Oral)  Resp 20  Ht 6' (1.829 m)  Wt 170 lb 12.8 oz (77.474 kg)  BMI 23.16 kg/m2  SpO2 96% Physical Exam  Constitutional: He is oriented to person, place, and time. He appears  well-developed and well-nourished.  HENT:  Head: Normocephalic.  Neck: Normal range of motion. Neck supple.  Cardiovascular: Normal rate and regular rhythm.   No murmur heard. Pulmonary/Chest: Effort normal and breath sounds normal. He has no wheezes. He has no rales. He exhibits no tenderness.  Abdominal: Soft. Bowel sounds are normal. There is no tenderness. There is no rebound and no guarding.  Musculoskeletal: Normal range of motion.  Neurological: He is alert and oriented to person, place, and time.  Skin: Skin is warm and dry. No rash noted.  Psychiatric: He has a normal mood and affect.    ED Course  Procedures (including critical care time) Labs Review Labs Reviewed  CULTURE, GROUP A STREP - Abnormal; Notable for the following:    Strep A Culture Positive (*)    All other components within normal limits  BASIC METABOLIC PANEL - Abnormal; Notable for the following:    Potassium 3.4 (*)    Chloride 100 (*)    Creatinine, Ser 1.26 (*)    All other components within normal limits  CBC - Abnormal; Notable for the following:    WBC 20.9 (*)    All other components within normal limits  URINALYSIS, ROUTINE W REFLEX MICROSCOPIC (NOT AT [facility]) - Abnormal; Notable for the following:    APPearance HAZY (*)    All other components within normal limits  DIFFERENTIAL - Abnormal; Notable for the following:    Neutro Abs 15.5 (*)    Monocytes Absolute 1.3 (*)    All other components within normal limits  COMPREHENSIVE METABOLIC PANEL - Abnormal; Notable for the following:    Potassium 3.4 (*)    Glucose, Bld 112 (*)    Calcium 8.3 (*)    Total Protein 6.3 (*)    Albumin 3.1 (*)    ALT 9 (*)    All other components within normal limits  COMPREHENSIVE METABOLIC PANEL - Abnormal; Notable for the following:    BUN <5 (*)    Albumin 3.3 (*)    ALT 10 (*)    All other components within normal limits  CBC - Abnormal; Notable for the following:    WBC 15.4 (*)    Platelets 137 (*)     All other components within normal limits  I-STAT CG4 LACTIC ACID, ED - Abnormal; Notable for the following:    Lactic Acid, Venous 2.84 (*)    All other components within normal limits  URINE CULTURE  RAPID STREP SCREEN (NOT AT [facility])  CULTURE, BLOOD (ROUTINE X 2)  CULTURE, BLOOD (ROUTINE X 2)  C DIFFICILE QUICK SCREEN W PCR REFLEX  LACTIC ACID, PLASMA  LACTIC ACID, PLASMA  INFLUENZA PANEL BY PCR (TYPE A & B, H1N1)(NOT AT [facility])  I-STAT TROPOININ, ED    Imaging Review No results found. I have personally reviewed and evaluated these  images and lab results as part of my medical decision-making.   EKG Interpretation   Date/Time:  Wednesday February 16 2015 04:50:46 EDT Ventricular Rate:  93 PR Interval:  168 QRS Duration: 98 QT Interval:  348 QTC Calculation: 432 R Axis:   103 Text Interpretation:  Normal sinus rhythm Right atrial enlargement Septal  infarct , age undetermined Lateral infarct , age undetermined Abnormal ECG  ED PHYSICIAN INTERPRETATION AVAILABLE IN [facility] Confirmed by  TEST, Record ([id number]) on 02/17/2015 7:01:27 AM      MDM   Final diagnoses:  URI (upper respiratory infection)  Leukocytosis  Lactic acid acidosis    Patient with HIV and hepatitis B presents with febrile illness, non-toxic, VSS no hypoxia. Review of chart reveals jump in WBC from 11.6 (8/15) to 20 today and a lactate of 2.84. Discussed with his PCP who will admit.  Rocephin and Zithromax provided IV, along with fluids. Patient advised of plan to admit and is in agreement.     Elpidio Anis, PA-C 02/21/15 1914  Shon Baton, MD 02/22/15 [phone]

## 2015-02-23 ENCOUNTER — Ambulatory Visit (INDEPENDENT_AMBULATORY_CARE_PROVIDER_SITE_OTHER): Payer: PRIVATE HEALTH INSURANCE | Admitting: Family Medicine

## 2015-02-23 VITALS — BP 148/92 | HR 70 | Temp 99.0°F | Ht 72.0 in | Wt 173.6 lb

## 2015-02-23 DIAGNOSIS — D72829 Elevated white blood cell count, unspecified: Secondary | ICD-10-CM

## 2015-02-23 LAB — CBC
HEMATOCRIT: 37.8 % — AB (ref 39.0–52.0)
Hemoglobin: 12.8 g/dL — ABNORMAL LOW (ref 13.0–17.0)
MCH: 29.6 pg (ref 26.0–34.0)
MCHC: 33.9 g/dL (ref 30.0–36.0)
MCV: 87.5 fL (ref 78.0–100.0)
MPV: 10.4 fL (ref 8.6–12.4)
Platelets: 260 10*3/uL (ref 150–400)
RBC: 4.32 MIL/uL (ref 4.22–5.81)
RDW: 14 % (ref 11.5–15.5)
WBC: 15.2 10*3/uL — ABNORMAL HIGH (ref 4.0–10.5)

## 2015-02-23 NOTE — Assessment & Plan Note (Signed)
Follow up from hospital  Leukocytosis could be from a viral illness  Denies any fevers  Having resolution of cough Exam not revealing for LAD or tonsillar exudates   Strep culture + but spoke with Dr. Luciana Axe (ID) and thought that he is colonized  - will repeat CBC today  - he has f/u with ID on Monday

## 2015-02-23 NOTE — Patient Instructions (Addendum)
Thank you for coming in,   I spoke with the Dr. Luciana Axe and he said not to treat with antibiotics.   I will call or send a letter with the results from today's labs.   Please bring all of your medications with you to each visit.    Please feel free to call with any questions or concerns at any time, at [phone]. --Dr. Jordan Likes

## 2015-02-23 NOTE — Progress Notes (Signed)
   Subjective:    Phillip Gill - 39 y.o. male MRN [id number]  Date of birth: 04/04/1976  HPI  KHALIL SZCZEPANIK is here for hospital f/u.   Leukocytosis: he has a hx of HIV and presented the ED with leukocytosis of 15.4 and hyperlacatemia. He was admitted and   C. Diff negative  Urine and blood cx both negative.  Rapid strep was negative but step culture +  He denies sore throat today  He has not recently been on Antibiotics.  He denies any sore throat.  Having some cough that is improving.      Health Maintenance:  Health Maintenance Due  Topic Date Due  . INFLUENZA VACCINE  01/31/2015    -  reports that he has been smoking Cigarettes.  He has a 9 pack-year smoking history. He has never used smokeless tobacco. - Review of Systems: Per HPI.  - Past Medical History: Patient Active Problem List   Diagnosis Date Noted  . Lactic acid acidosis 02/16/2015  . Leukocytosis   . URI (upper respiratory infection)   . HIV (human immunodeficiency virus infection)   . Rash and nonspecific skin eruption 02/02/2015  . LGV (lymphogranuloma venereum)   . Primary syphilis   . HIV disease   . 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 Penile ulcer 10/03/2014  . Inguinal adenopathy 10/03/2014  . Human immunodeficiency virus (HIV) disease   . Essential hypertension   . Gynecomastia 04/30/2014  . 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 Tobacco dependence 06/03/2012  . Fibromyalgia muscle pain 02/11/2012  . Depression 12/07/2011  . Anxiety 02/02/2011   - Medications: reviewed and updated Current Outpatient Prescriptions  Medication Sig Dispense Refill  . elvitegravir-cobicistat-emtricitabine-tenofovir (GENVOYA) 150-150-200-10 MG TABS tablet Take 1 tablet by mouth daily with breakfast. 30 tablet 11  . HYDROcodone-acetaminophen (NORCO) 5-325 MG per tablet Take 1 tablet by mouth every 6 (six) hours as needed for moderate pain. (Patient not taking: Reported on 10/20/2014) 30 tablet 0  . imiquimod (ALDARA)  5 % cream APPLY TO THE AFFECTED AREAS THREE TIMES PER WEEK 12 each 1  . lisinopril (PRINIVIL,ZESTRIL) 20 MG tablet TAKE 1 TABLET BY MOUTH EVERY DAY 30 tablet 6  . metoprolol succinate (TOPROL-XL) 25 MG 24 hr tablet Take 1 tablet (25 mg total) by mouth daily. 30 tablet 11  . sodium chloride (OCEAN) 0.65 % SOLN nasal spray Place 1 spray into both nostrils as needed for congestion. (Patient not taking: Reported on 02/16/2015) 30 mL 0  . [DISCONTINUED] gabapentin (NEURONTIN) 300 MG capsule Take 1 capsule (300 mg total) by mouth 3 (three) times daily. 90 capsule 3   No current facility-administered medications for this visit.     Review of Systems See HPI     Objective:   Physical Exam BP 160/104 mmHg  Pulse 70  Temp(Src) 99 F (37.2 C) (Oral)  Ht 6' (1.829 m)  Wt 173 lb 9.6 oz (78.744 kg)  BMI 23.54 kg/m2 Gen: NAD, alert, cooperative with exam, well-appearing HEENT: NCAT, PERRL, clear conjunctiva, oropharynx clear, no cervical LAD, no tonsillar exudates, EOMI, TM's clear and intact  Skin: no rashes, normal turgor  Neuro: no gross deficits.  Psych:  alert and oriented  BP re-check 148/92    Assessment & Plan:

## 2015-02-24 ENCOUNTER — Encounter: Payer: Self-pay | Admitting: Family Medicine

## 2015-02-28 ENCOUNTER — Ambulatory Visit: Payer: Self-pay

## 2015-02-28 ENCOUNTER — Ambulatory Visit (INDEPENDENT_AMBULATORY_CARE_PROVIDER_SITE_OTHER): Payer: PRIVATE HEALTH INSURANCE | Admitting: Infectious Disease

## 2015-02-28 ENCOUNTER — Encounter: Payer: Self-pay | Admitting: Infectious Disease

## 2015-02-28 VITALS — BP 163/111 | HR 71 | Temp 97.9°F | Wt 170.0 lb

## 2015-02-28 DIAGNOSIS — A55 Chlamydial lymphogranuloma (venereum): Secondary | ICD-10-CM

## 2015-02-28 DIAGNOSIS — B20 Human immunodeficiency virus [HIV] disease: Secondary | ICD-10-CM | POA: Diagnosis not present

## 2015-02-28 DIAGNOSIS — A510 Primary genital syphilis: Secondary | ICD-10-CM

## 2015-02-28 DIAGNOSIS — F17200 Nicotine dependence, unspecified, uncomplicated: Secondary | ICD-10-CM

## 2015-02-28 DIAGNOSIS — I10 Essential (primary) hypertension: Secondary | ICD-10-CM

## 2015-02-28 DIAGNOSIS — Z113 Encounter for screening for infections with a predominantly sexual mode of transmission: Secondary | ICD-10-CM

## 2015-02-28 DIAGNOSIS — Z23 Encounter for immunization: Secondary | ICD-10-CM | POA: Diagnosis not present

## 2015-02-28 HISTORY — DX: Human immunodeficiency virus (HIV) disease: B20

## 2015-02-28 MED ORDER — NICOTINE 7 MG/24HR TD PT24: 7.0000 mg | MEDICATED_PATCH | Freq: Every day | TRANSDERMAL | Status: DC

## 2015-02-28 MED ORDER — NICOTINE 14 MG/24HR TD PT24: 14.0000 mg | MEDICATED_PATCH | TRANSDERMAL | Status: DC

## 2015-02-28 NOTE — Progress Notes (Signed)
Subjective:    Patient ID: Phillip Gill, male    DOB: Sep 24, 1975, 39 y.o.   MRN: [id number]  HPI   39 year old African American man who I have followed for some time now who was admitted to [facility] with septic syndrome and apparent LGV (Chlamydia was + from urine) and he had inguinal LA and painless ulcer and was quiet systemically ill. He also had syphilis with + RPR to 1:64 and one could argue about the ulcer as to whether due to syphilis or LGV. He received 2.4 mu of PCN IM for syphilis, and is on course to complete 21 days of doxycycline. He also had received rocephin and azithromycin despite not being diagnosed with GC.   He has since been seen by [place] in clinic and had repeat labs including NR RPR and negative GC and chlamydia from urine. He was admitted to [facility] with leukocytosis, sore throat, cough lactic acidosis. He was treated with antibiotics as an inpatient and then DC. Still has a bit of a cough.  He admitted to having unprotected insertive anal intercourse and oral sex but denied having receptive anal intercourse.   He is interested in smoking cessation.   Review of Systems  Constitutional: Positive for fatigue. Negative for chills, diaphoresis, activity change, appetite change and unexpected weight change.  HENT: Positive for sore throat. Negative for congestion, rhinorrhea, sinus pressure, sneezing and trouble swallowing.   Eyes: Negative for photophobia and visual disturbance.  Respiratory: Positive for cough. Negative for chest tightness, shortness of breath, wheezing and stridor.   Cardiovascular: Negative for chest pain, palpitations and leg swelling.  Gastrointestinal: Negative for nausea, vomiting, abdominal pain, diarrhea, constipation, blood in stool, abdominal distention and anal bleeding.  Genitourinary: Negative for dysuria, hematuria, flank pain and difficulty urinating.  Musculoskeletal: Negative for myalgias, back pain, joint swelling, arthralgias and  gait problem.  Skin: Negative for color change, pallor, rash and wound.  Neurological: Negative for dizziness, tremors, weakness and light-headedness.  Hematological: Negative for adenopathy. Does not bruise/bleed easily.  Psychiatric/Behavioral: Negative for behavioral problems, confusion, sleep disturbance, dysphoric mood, decreased concentration and agitation.       Objective:   Physical Exam  Constitutional: He is oriented to person, place, and time. He appears well-developed and well-nourished.  HENT:  Head: Normocephalic and atraumatic.  Mouth/Throat: Uvula is midline and oropharynx is clear and moist. No oropharyngeal exudate or posterior oropharyngeal edema.  Eyes: Conjunctivae and EOM are normal.  Neck: Normal range of motion. Neck supple.  Cardiovascular: Normal rate and regular rhythm.  Exam reveals friction rub. Exam reveals no gallop.   No murmur heard. Pulmonary/Chest: Effort normal and breath sounds normal. No respiratory distress. He has no wheezes. He has no rales.  Abdominal: Soft. Bowel sounds are normal. He exhibits no distension. There is no tenderness.  Musculoskeletal: Normal range of motion. He exhibits no edema or tenderness.  Neurological: He is alert and oriented to person, place, and time.  Skin: Skin is warm and dry. No rash noted. No erythema. No pallor.  Psychiatric: He has a normal mood and affect. His behavior is normal. Judgment and thought content normal.          Assessment & Plan:   LGV: sp treatment, GC and chlamydia negative from urine, will recheck oropharynx as well today Syphilis: follow titers  HIV: switched to Genvoya from Prezcobix and Truvada in context of renal failure, doing well with perfect virological suppression at present  Smoking: nicotine  patch  HTN: not well controlled, and claims he took meds last night. He does not want to go up ont he ACEI.Marland Kitchen Perhaps add a diuretic or try to push the beta blocker next visit  Cough:  seems to be getting better. Lungs clear  Sore throat: checking GC and chlamydia  Lactic acidosis: not sure what cause was this time. Is resolved and is NOT related to his ARV  I spent greater than 25 minutes with the patient including greater than 50% of time in face to face counsel of the patient re his HIV, GC, chlamydia, syphilis, cough and sore throat and in coordination of their care.

## 2015-03-01 LAB — CYTOLOGY, (ORAL, ANAL, URETHRAL) ANCILLARY ONLY
Chlamydia: NEGATIVE
Neisseria Gonorrhea: NEGATIVE

## 2015-03-08 ENCOUNTER — Observation Stay (HOSPITAL_COMMUNITY): Payer: PRIVATE HEALTH INSURANCE

## 2015-03-08 ENCOUNTER — Emergency Department (HOSPITAL_COMMUNITY): Payer: Self-pay

## 2015-03-08 ENCOUNTER — Inpatient Hospital Stay (HOSPITAL_COMMUNITY)
Admission: EM | Admit: 2015-03-08 | Discharge: 2015-03-09 | DRG: 977 | Disposition: A | Discharge status: Home / Self Care | Payer: Self-pay | Attending: Family Medicine | Admitting: Family Medicine

## 2015-03-08 ENCOUNTER — Encounter (HOSPITAL_COMMUNITY): Payer: Self-pay | Admitting: Emergency Medicine

## 2015-03-08 DIAGNOSIS — B20 Human immunodeficiency virus [HIV] disease: Secondary | ICD-10-CM | POA: Diagnosis present

## 2015-03-08 DIAGNOSIS — N179 Acute kidney failure, unspecified: Secondary | ICD-10-CM | POA: Diagnosis present

## 2015-03-08 DIAGNOSIS — Z720 Tobacco use: Secondary | ICD-10-CM

## 2015-03-08 DIAGNOSIS — R197 Diarrhea, unspecified: Secondary | ICD-10-CM | POA: Diagnosis present

## 2015-03-08 DIAGNOSIS — Z888 Allergy status to other drugs, medicaments and biological substances status: Secondary | ICD-10-CM

## 2015-03-08 DIAGNOSIS — I10 Essential (primary) hypertension: Secondary | ICD-10-CM | POA: Diagnosis present

## 2015-03-08 DIAGNOSIS — K529 Noninfective gastroenteritis and colitis, unspecified: Principal | ICD-10-CM | POA: Diagnosis present

## 2015-03-08 DIAGNOSIS — Z21 Asymptomatic human immunodeficiency virus [HIV] infection status: Secondary | ICD-10-CM

## 2015-03-08 DIAGNOSIS — Z79899 Other long term (current) drug therapy: Secondary | ICD-10-CM

## 2015-03-08 DIAGNOSIS — B181 Chronic viral hepatitis B without delta-agent: Secondary | ICD-10-CM | POA: Diagnosis present

## 2015-03-08 LAB — URINALYSIS, ROUTINE W REFLEX MICROSCOPIC
Bilirubin Urine: NEGATIVE
GLUCOSE, UA: NEGATIVE mg/dL
HGB URINE DIPSTICK: NEGATIVE
KETONES UR: NEGATIVE mg/dL
Nitrite: NEGATIVE
PH: 6 (ref 5.0–8.0)
Protein, ur: NEGATIVE mg/dL
Specific Gravity, Urine: 1.002 — ABNORMAL LOW (ref 1.005–1.030)
Urobilinogen, UA: 0.2 mg/dL (ref 0.0–1.0)

## 2015-03-08 LAB — LIPASE, BLOOD: LIPASE: 18 U/L — AB (ref 22–51)

## 2015-03-08 LAB — CBC
HCT: 41.1 % (ref 39.0–52.0)
Hemoglobin: 13.5 g/dL (ref 13.0–17.0)
MCH: 30.1 pg (ref 26.0–34.0)
MCHC: 32.8 g/dL (ref 30.0–36.0)
MCV: 91.5 fL (ref 78.0–100.0)
PLATELETS: 197 10*3/uL (ref 150–400)
RBC: 4.49 MIL/uL (ref 4.22–5.81)
RDW: 13.4 % (ref 11.5–15.5)
WBC: 11.4 10*3/uL — ABNORMAL HIGH (ref 4.0–10.5)

## 2015-03-08 LAB — COMPREHENSIVE METABOLIC PANEL
ALBUMIN: 3.2 g/dL — AB (ref 3.5–5.0)
ALK PHOS: 62 U/L (ref 38–126)
ALT: 11 U/L — AB (ref 17–63)
AST: 24 U/L (ref 15–41)
Anion gap: 6 (ref 5–15)
BUN: 6 mg/dL (ref 6–20)
CALCIUM: 8.7 mg/dL — AB (ref 8.9–10.3)
CHLORIDE: 98 mmol/L — AB (ref 101–111)
CO2: 29 mmol/L (ref 22–32)
CREATININE: 1.27 mg/dL — AB (ref 0.61–1.24)
GFR calc Af Amer: >60 mL/min (ref >60)
GFR calc non Af Amer: >60 mL/min (ref >60)
GLUCOSE: 139 mg/dL — AB (ref 65–99)
Potassium: 3.5 mmol/L (ref 3.5–5.1)
SODIUM: 133 mmol/L — AB (ref 135–145)
Total Bilirubin: 0.6 mg/dL (ref 0.3–1.2)
Total Protein: 6.8 g/dL (ref 6.5–8.1)

## 2015-03-08 LAB — URINE MICROSCOPIC-ADD ON

## 2015-03-08 LAB — C DIFFICILE QUICK SCREEN W PCR REFLEX
C DIFFICILE (CDIFF) INTERP: NEGATIVE
C Diff antigen: NEGATIVE
C Diff toxin: NEGATIVE

## 2015-03-08 MED ORDER — MORPHINE SULFATE (PF) 4 MG/ML IV SOLN
4.0000 mg | Freq: Once | INTRAVENOUS | Status: AC
  Administered 2015-03-08: 4 mg via INTRAVENOUS
  Filled 2015-03-08: qty 1

## 2015-03-08 MED ORDER — CIPROFLOXACIN IN D5W 400 MG/200ML IV SOLN
400.0000 mg | Freq: Two times a day (BID) | INTRAVENOUS | Status: DC
  Administered 2015-03-08 – 2015-03-09 (×2): 400 mg via INTRAVENOUS
  Filled 2015-03-08 (×2): qty 200

## 2015-03-08 MED ORDER — ONDANSETRON HCL 4 MG PO TABS: 4.0000 mg | ORAL_TABLET | Freq: Four times a day (QID) | ORAL | Status: DC | PRN

## 2015-03-08 MED ORDER — SODIUM CHLORIDE 0.9 % IV BOLUS (SEPSIS)
2000.0000 mL | Freq: Once | INTRAVENOUS | Status: AC
  Administered 2015-03-08: 2000 mL via INTRAVENOUS

## 2015-03-08 MED ORDER — SODIUM CHLORIDE 0.9 % IV SOLN
INTRAVENOUS | Status: DC
  Administered 2015-03-08 – 2015-03-09 (×3): via INTRAVENOUS

## 2015-03-08 MED ORDER — ACETAMINOPHEN 650 MG RE SUPP: 650.0000 mg | Freq: Four times a day (QID) | RECTAL | Status: DC | PRN

## 2015-03-08 MED ORDER — ELVITEG-COBIC-EMTRICIT-TENOFAF 150-150-200-10 MG PO TABS
1.0000 | ORAL_TABLET | Freq: Every day | ORAL | Status: DC
  Administered 2015-03-08 – 2015-03-09 (×2): 1 via ORAL
  Filled 2015-03-08 (×3): qty 1

## 2015-03-08 MED ORDER — OXYCODONE HCL 5 MG PO TABS
5.0000 mg | ORAL_TABLET | ORAL | Status: DC | PRN
  Administered 2015-03-08: 5 mg via ORAL
  Filled 2015-03-08: qty 1

## 2015-03-08 MED ORDER — SODIUM CHLORIDE 0.9 % IV SOLN
INTRAVENOUS | Status: AC
  Administered 2015-03-08: 06:00:00 via INTRAVENOUS

## 2015-03-08 MED ORDER — IOHEXOL 300 MG/ML  SOLN
100.0000 mL | Freq: Once | INTRAMUSCULAR | Status: AC | PRN
  Administered 2015-03-08: 100 mL via INTRAVENOUS

## 2015-03-08 MED ORDER — ONDANSETRON HCL 4 MG/2ML IJ SOLN: 4.0000 mg | Freq: Four times a day (QID) | INTRAMUSCULAR | Status: DC | PRN

## 2015-03-08 MED ORDER — IOHEXOL 300 MG/ML  SOLN
25.0000 mL | Freq: Once | INTRAMUSCULAR | Status: AC | PRN
  Administered 2015-03-08: 25 mL via ORAL

## 2015-03-08 MED ORDER — METRONIDAZOLE IN NACL 5-0.79 MG/ML-% IV SOLN
500.0000 mg | Freq: Three times a day (TID) | INTRAVENOUS | Status: DC
  Administered 2015-03-08 – 2015-03-09 (×3): 500 mg via INTRAVENOUS
  Filled 2015-03-08 (×3): qty 100

## 2015-03-08 MED ORDER — METOPROLOL SUCCINATE ER 25 MG PO TB24
25.0000 mg | ORAL_TABLET | Freq: Every day | ORAL | Status: DC
  Administered 2015-03-08 – 2015-03-09 (×2): 25 mg via ORAL
  Filled 2015-03-08 (×2): qty 1

## 2015-03-08 MED ORDER — ACETAMINOPHEN 325 MG PO TABS: 650.0000 mg | ORAL_TABLET | Freq: Four times a day (QID) | ORAL | Status: DC | PRN

## 2015-03-08 MED ORDER — HEPARIN SODIUM (PORCINE) 5000 UNIT/ML IJ SOLN: 5000.0000 [IU] | Freq: Three times a day (TID) | INTRAMUSCULAR | Status: DC

## 2015-03-08 NOTE — Care Management Note (Signed)
Case Management Note  Patient Details  Name: BERTIS HUSTEAD MRN: [id number] Date of Birth: Nov 23, 1975  Subjective/Objective:                 Patient from home, self care. Admitted with Colitis. Last CD4  8/15 was 1360, pt reports compliance with HIV regimen. Will continue Genvoyawhile in hospital.    Action/Plan:  Will continue to follow and offer resources as needed.  Expected Discharge Date:  03/11/15               Expected Discharge Plan:  Home/Self Care  In-House Referral:     Discharge planning Services  CM Consult  Post Acute Care Choice:    Choice offered to:     DME Arranged:    DME Agency:     HH Arranged:    HH Agency:     Status of Service:  In process, will continue to follow  Medicare Important Message Given:    Date Medicare IM Given:    Medicare IM give by:    Date Additional Medicare IM Given:    Additional Medicare Important Message give by:     If discussed at Long Length of Stay Meetings, dates discussed:    Additional Comments:  [name], RN 03/08/2015, 12:20 PM

## 2015-03-08 NOTE — Consult Note (Addendum)
ANTIBIOTIC CONSULT NOTE - INITIAL  Pharmacy Consult for ciprofloxacin Indication: colitis  Allergies  Allergen Reactions  . Bactrim [Sulfamethoxazole W/Trimethoprim (Co-Trimoxazole)] Hives and Shortness Of Breath  . Cymbalta [Duloxetine Hcl] Diarrhea  . Other Hives and Swelling    Colgate toothpaste   . Zoloft [Sertraline Hcl] Diarrhea  . Neurontin [Gabapentin] Rash    Vital Signs: Temp: 97.9 F (36.6 C) (09/06 0627) Temp Source: Oral (09/06 0627) BP: 125/76 mmHg (09/06 1001) Pulse Rate: 66 (09/06 1001)  Labs:  Recent Labs  03/08/15 0100  WBC 11.4*  HGB 13.5  PLT 197  CREATININE 1.27*   Estimated Creatinine Clearance: 86 mL/min (by C-G formula based on Cr of 1.27). No results for input(s): VANCOTROUGH, VANCOPEAK, VANCORANDOM, GENTTROUGH, GENTPEAK, GENTRANDOM, TOBRATROUGH, TOBRAPEAK, TOBRARND, AMIKACINPEAK, AMIKACINTROU, AMIKACIN in the last 72 hours.   Assessment: 39 yo male presenting with diarrhea  PMH: HIV on genvoya (last CD4 1360 8/16), Chronic Hep B, HTN, Hx of Primary Syphilis and Chlamydia  ID: abx for colitis. WBC 11.4, AF.   Cipro 9/6>> Metronidazole 9/5>>  Stool 9/6>> cdif neg, pending parasite and ova  Renal: AKI with Cr of 1.27, CrCl ~ 86 mL/min  Goal of Therapy:  Resolution of infection  Plan:  -Cipro 400 mg IV q12h, Metronidazole per MD -Monitor clinical status, culture results  Isaac Bliss, PharmD, BCPS Clinical Pharmacist Pager [phone] 03/08/2015 10:57 AM

## 2015-03-08 NOTE — ED Notes (Signed)
Pt. reports LLQ pain with diarrhea onset Saturday  , " I passed out" , denies nausea or vomitting / no fever or chills.

## 2015-03-08 NOTE — Progress Notes (Signed)
Family Medicine Teaching Service Daily Progress Note Intern Pager: [phone]  Patient name: Phillip Gill Medical record number: [id number] Date of birth: May 17, 1976 Age: 39 y.o. Gender: male  Primary Care Provider: Clare Gandy, MD Consultants: None Code Status: Full  Pt Overview and Major Events to Date:  9/6: Admit for diarrhea  Assessment and Plan:  Acute Diarrhea: Continued diarrhea this AM, improving abd pain - f/u c diff, ova/parasites, GI pathogen panel - MIVF, s/p 2L bolus NS in the ED - trend CBC - enteric precautions - IV cipro/flagyl  Leukocytosis: seems to be improving since his last admission. Denies any fevers and improvement of his cough.  - follow CBC  AKI- Cr 1.27, his normal seems to be between .98-1.26. Potentially an element of dehydration contributing, but BUN:Cr < 20 and pt reports good liquid PO intake - MIVF as above - Trend CR - I/Os  Hepatitis B - Stable, normal LFTs, bilirubin.  - Trend CMP  HIV - Last CD4 8/15 was 1360, pt reports compliance with HIV regimen.  - Continue Genvoya - Trend CBC as above  Hx of Primary Syhpilis and Chlamydia - History of + Chlamydia in 10/2014 s/p treatment and subsequent negative results,. Pt continues to have sex with men but uses protection. No lesions or inguinal lymphadenopathy on exam at this time   # HTN - Stable blood pressures -Hold home lisinopril 20 qD until AKI improves - add metoprolol 25 qD  FEN/GI: Regular Diet. MIVF.  Prophylaxis: Lovenox.   Subjective:  Reports one large loose green/brown colored stool. Still has abd pain but some what improved.  Tolerated PO well, denies SOB, chest pain  Objective: Temp:  [97.7 F (36.5 C)-98.1 F (36.7 C)] 97.9 F (36.6 C) (09/06 0627) Pulse Rate:  [59-88] 65 (09/06 0627) Resp:  [14-19] 18 (09/06 0627) BP: (115-150)/(77-93) 132/84 mmHg (09/06 0627) SpO2:  [93 %-100 %] 100 % (09/06 1478) Physical Exam: General: NAD, sitting up in bed  eating breakfast comfortably Cardiovascular: RRR Respiratory: CTAB Abdomen: soft, mildly tender diffusely, mild guarding, non distended, no rebound Extremities: no LE edema or tenderness  Laboratory:  Recent Labs Lab 03/08/15 0100  WBC 11.4*  HGB 13.5  HCT 41.1  PLT 197    Recent Labs Lab 03/08/15 0100  NA 133*  K 3.5  CL 98*  CO2 29  BUN 6  CREATININE 1.27*  CALCIUM 8.7*  PROT 6.8  BILITOT 0.6  ALKPHOS 62  ALT 11*  AST 24  GLUCOSE 139*      Imaging/Diagnostic Tests: CXR:  Pancolitis may be related to an inflammatory process such is ulcerative colitis or infectious in etiology related to C difficile. Correlation with history of recent antibiotic use, clinical exam, and stool cultures recommended.  Diarrheal state.  Right lung base nodularity concerning for pneumonia. Clinical correlation and follow-up recommended.  IMPRESSION: No active cardiopulmonary disease.  CT abd:    Bonney Aid, MD 03/08/2015, 9:49 AM PGY-2, [facility] FPTS Intern pager: [phone], text pages welcome

## 2015-03-08 NOTE — ED Notes (Signed)
Called Dr. Ranae Palms as patient requested food to eat, md acknowledges, awaiting family medicine to see patient first. Informed patient in regards to plan of care, and waiting for admitting md.

## 2015-03-08 NOTE — H&P (Signed)
Family Medicine Teaching [facility] Admission History and Physical Service Pager: [phone]  Patient name: Phillip Gill Medical record number: [id number] Date of birth: 01/03/1976 Age: 39 y.o. Gender: male  Primary Care Provider: Clare Gandy, MD Consultants: None Code Status: Full  Chief Complaint:  Diarrhea  Assessment and Plan: Phillip Gill is a 39 y.o. male presenting with diarrhea  . PMH is significant for HIV (well controlled on Genvoya last CD4 1360 on 02/14/2015). 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   Acute Diarrhea: Diarrhea for the last 3 days with considerable abdominal pain and WBC 11.7 concerning for inflammatory bowel process versus c diff given recent admission versus other bacterial diarrhea versus viral diarrhea. Stool non bloody making bacterial collitis less likely. Pt denies a history of diarrhea similar to this episode, and denies chronic alternation between diarrhea and constipation making IBS less likely. While he has had recent admission concerning for c diff exposure, lack of fever and improving diarrhea makes c diff less likely. Given his stools are becoming more formed per history with reduction in diarrhea it is more likely that he is having a self limited viral gastroenteritis - Will admit to  Observation to assess PO intake and monitor for further diarrhea - If has diarrhea will collect c diff and stool studies - MIVF, s/p 2L bolus NS in the ED - trend CBC  Leukocytosis: seems to be improving since his last admission. Denies any fevers and improvement of his cough.  - CBC am   AKI- Cr 1.27, his normal seems to be between .98-1.26. Potentially an element of dehydration contributing, but BUN:Cr < 20 and pt reports good liquid PO intake - MIVF as above - Trend CR - I/Os  Hepatitis B - Stable, normal LFTs, bilirubin.  - Trend CMP  HIV - Last CD4 8/15 was  1360, pt reports compliance with HIV regimen.  - Continue Genvoya - Trend CBC as above   Hx of Primary Syhpilis and Chlamydia - History of + Chlamydia in 10/2014 s/p treatment and subsequent negative results,. Pt continues to have sex with men but uses protection.  No lesions or inguinal lymphadenopathy on exam at this time   # HTN - Stable blood pressures -Hold home lisinopril until AKI improves  FEN/GI: Regular Diet. MIVF.  Prophylaxis: Lovenox.   Disposition: Admission to observation, attending Dr Lum Babe  History of Present Illness:  [name] is a 39 y.o. male presenting with watery, non bloody diarrhea for the last 3 days. Diarrhea started on Saturday with 6-7 stools, then felt as if " he was constantly in the bathroom all day" on Sunday. Today has had less stools, with his last stool 4 hours ago. He felt this stool was more formed but still " chopped up". He has had some nausea but no emesis. He has had less appetitite and had not eaten anything solid since Saturday. He has been able to drink normally. Significantly on Sunday he twice he felt weak after using the restroom, laid down on his bed and " the next thing he knew 30 minutes had passed". He denies fall or trauma He has had considerable abdominal pain associated with this.  However, he states that he overall feels only he still has some abdominal pain. He significantly went to a family reunion on Saturday and ate pork which he reports he hs not had since the 90s. He denies any sick contacts  Reports some residual cough that he has had since his last admission but it has gotten better. Denies fever, rashes, chest pain or shortness of breath  Review Of Systems: Per HPI    Patient Active Problem List   Diagnosis Date Noted  . HIV disease 02/28/2015  . Leukocytosis   . LGV (lymphogranuloma venereum)   . Primary syphilis   . 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 Inguinal adenopathy 10/03/2014  . Human immunodeficiency  virus (HIV) disease   . Essential hypertension   . Gynecomastia 04/30/2014  . 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 Tobacco dependence 06/03/2012  . Fibromyalgia muscle pain 02/11/2012  . Depression 12/07/2011  . Anxiety 02/02/2011   Past Medical History: Past Medical History  Diagnosis Date  . Anxiety   . Depression   . HIV infection     followed by Dr. Luciana Axe- sees him every 4 months  . Hypertension   . 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 IBS (irritable bowel syndrome)   . Renal insufficiency 12/29/2013  . Migraine     "none in years" (02/16/2015  . Arthritis     "neck" (02/16/2015)  . Fibromyalgia   . Chronic lower back pain   . HIV disease 02/28/2015   Past Surgical History: Past Surgical History  Procedure Laterality Date  . Wisdom tooth extraction    . High resolution anoscopy N/A 02/11/2013    Procedure: HIGH RESOLUTION ANOSCOPY WITH BIOPSY, LASER ABLATION;  Surgeon: Romie Levee, MD;  Location: [facility];  Service: General;  Laterality: N/A;  . Co2 laser application N/A 02/11/2013    Procedure: CO2 LASER APPLICATION;  Surgeon: Romie Levee, MD;  Location: [facility] [place];  Service: General;  Laterality: N/A;   Social History: Social History  Substance Use Topics  . Smoking status: Current Every Day Smoker -- 0.50 packs/day for 18 years    Types: Cigarettes  . Smokeless tobacco: Never Used  . Alcohol Use: Yes   Additional social history: Continues to have sex with men but uses condoms Please also refer to relevant sections of EMR.  Family History: Family History  Problem Relation Age of Onset  . Mental illness Neg Hx   . Hypertension Mother   . Diabetes Mother   . Stroke Mother     cerbral aneurysm  . Hypertension Brother    Allergies and Medications: Allergies  Allergen Reactions  . Bactrim [Sulfamethoxazole W/Trimethoprim (Co-Trimoxazole)] Hives and Shortness Of Breath  . Cymbalta [Duloxetine Hcl] Diarrhea  . Other Hives  and Swelling    Colgate toothpaste   . Zoloft [Sertraline Hcl] Diarrhea  . Neurontin [Gabapentin] Rash   No current facility-administered medications on file prior to encounter.   Current Outpatient Prescriptions on File Prior to Encounter  Medication Sig Dispense Refill  . elvitegravir-cobicistat-emtricitabine-tenofovir (GENVOYA) 150-150-200-10 MG TABS tablet Take 1 tablet by mouth daily with breakfast. 30 tablet 11  . imiquimod (ALDARA) 5 % cream APPLY TO THE AFFECTED AREAS THREE TIMES PER WEEK 12 each 1  . lisinopril (PRINIVIL,ZESTRIL) 20 MG tablet TAKE 1 TABLET BY MOUTH EVERY DAY 30 tablet 6  . metoprolol succinate (TOPROL-XL) 25 MG 24 hr tablet Take 1 tablet (25 mg total) by mouth daily. 30 tablet 11  . nicotine (NICODERM CQ - DOSED IN MG/24 HOURS) 14 mg/24hr patch Place 1 patch (14 mg total) onto the skin daily. (Patient not taking: Reported on 03/08/2015) 30 patch 1  . nicotine (NICODERM CQ - DOSED IN  MG/24 HR) 7 mg/24hr patch Place 1 patch (7 mg total) onto the skin daily. (Patient not taking: Reported on 03/08/2015) 7 patch 14  . [DISCONTINUED] gabapentin (NEURONTIN) 300 MG capsule Take 1 capsule (300 mg total) by mouth 3 (three) times daily. 90 capsule 3    Objective: BP 119/83 mmHg  Pulse 63  Temp(Src) 98.1 F (36.7 C) (Oral)  Resp 15  SpO2 100% Exam: General: NAD, lying in bed HEENT: NCAT, MMM, EOMI Cardiovascular: RR, no murmurs auscultated Respiratory: CTAB, no wheezes, rales or rhonchi Abdomen: soft, non distended, diffusely tender worse on the left, no rigidity, + guarding, negative rebound tenderness Extremities: no LE edema, non tender Skin:no rashes or lesion Neuro: no focal deficits Psych: normal mood and affect  Labs and Imaging: CBC BMET   Recent Labs Lab 03/08/15 0100  WBC 11.4*  HGB 13.5  HCT 41.1  PLT 197    Recent Labs Lab 03/08/15 0100  NA 133*  K 3.5  CL 98*  CO2 29  BUN 6  CREATININE 1.27*  GLUCOSE 139*  CALCIUM 8.7*     03/08/2015 CT  abd pelvis:  IMPRESSION: Pancolitis may be related to an inflammatory process such is ulcerative colitis or infectious in etiology related to C difficile. Correlation with history of recent antibiotic use, clinical exam, and stool cultures recommended.  Diarrheal state.  Right lung base nodularity concerning for pneumonia. Clinical correlation and follow-up recommended.  Bonney Aid, MD 03/08/2015, 4:11 AM PGY-2, [facility] FPTS Intern pager: [phone], text pages welcome  Upper Level Addendum:  I have seen and evaluated this patient along with Dr. Kennon Rounds and reviewed the above note, making necessary revisions in [facility].   Clare Gandy, MD Family Medicine PGY-3

## 2015-03-08 NOTE — ED Notes (Signed)
Meal provided 

## 2015-03-08 NOTE — ED Notes (Signed)
Family practice at the bedside.

## 2015-03-08 NOTE — ED Notes (Signed)
Patient consumed all of sandwich, apple sauce, and most of ginger ale, and shortly after diarrhea started. Sample collected.

## 2015-03-08 NOTE — ED Provider Notes (Signed)
CSN: [id number]     Arrival date & time 03/08/15  0034 History  This chart was scribed for Loren Racer, MD by Phillis Haggis, ED Scribe. This patient was seen in room B16C/B16C and patient care was started at 12:50 AM.   Chief Complaint  Patient presents with  . Abdominal Pain  . Diarrhea   The history is provided by the patient. No language interpreter was used.  HPI Comments: Phillip Gill is a 39 y.o. Male with a hx of HIV disease, chronic hepatitis B, HTN, IBS, and Fibromyalgia who presents to the Emergency Department complaining of diarrhea onset 3 days ago. Pt reports 4 episodes Saturday, all day Sunday and 5-6 episodes today. He states that the stools have been very watery with some abdominal cramping. Reports associated nausea. States that he has been able to tolerate liquids normally but has not eaten since Saturday, because he has not had much of an appetite. Pt was recently hospitalized and experienced diarrhea during his admission. Reports episodes of syncope on Sunday. States he was outside in the heat when he began to feel weak, so he laid in bed and the next thing he knew, 30 minutes had passed. Denies hx of similar symptoms. States his CD4 count is over 1000. Denies hematochezia, fever or vomiting.   Past Medical History  Diagnosis Date  . Anxiety   . Depression   . HIV infection     followed by Dr. Luciana Axe- sees him every 4 months  . Hypertension   . 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 IBS (irritable bowel syndrome)   . Renal insufficiency 12/29/2013  . Migraine     "none in years" (02/16/2015  . Arthritis     "neck" (02/16/2015)  . Fibromyalgia   . Chronic lower back pain   . HIV disease 02/28/2015   Past Surgical History  Procedure Laterality Date  . Wisdom tooth extraction    . High resolution anoscopy N/A 02/11/2013    Procedure: HIGH RESOLUTION ANOSCOPY WITH BIOPSY, LASER ABLATION;  Surgeon: Romie Levee, MD;  Location: [facility];   Service: General;  Laterality: N/A;  . Co2 laser application N/A 02/11/2013    Procedure: CO2 LASER APPLICATION;  Surgeon: Romie Levee, MD;  Location: [facility];  Service: General;  Laterality: N/A;   Family History  Problem Relation Age of Onset  . Mental illness Neg Hx   . Hypertension Mother   . Diabetes Mother   . Stroke Mother     cerbral aneurysm  . Hypertension Brother    Social History  Substance Use Topics  . Smoking status: Current Every Day Smoker -- 0.50 packs/day for 18 years    Types: Cigarettes  . Smokeless tobacco: Never Used  . Alcohol Use: Yes     Comment: occasionally    Review of Systems  Constitutional: Negative for fever and chills.  Respiratory: Negative for shortness of breath.   Cardiovascular: Negative for chest pain.  Gastrointestinal: Positive for nausea, abdominal pain and diarrhea. Negative for vomiting, constipation and blood in stool.  Musculoskeletal: Negative for back pain, neck pain and neck stiffness.  Skin: Negative for rash and wound.  Neurological: Positive for syncope and light-headedness. Negative for dizziness, weakness, numbness and headaches.  All other systems reviewed and are negative.     Allergies  Bactrim; Cymbalta; Other; Zoloft; and Neurontin  Home Medications   Prior to Admission medications   Medication Sig Start Date  End Date Taking? Authorizing Provider  elvitegravir-cobicistat-emtricitabine-tenofovir (GENVOYA) 150-150-200-10 MG TABS tablet Take 1 tablet by mouth daily with breakfast. 10/20/14  Yes Randall Hiss, MD  imiquimod [facility]) 5 % cream APPLY TO THE AFFECTED AREAS THREE TIMES PER WEEK 02/14/15  Yes Gardiner Barefoot, MD  lisinopril (PRINIVIL,ZESTRIL) 20 MG tablet TAKE 1 TABLET BY MOUTH EVERY DAY 11/02/14  Yes Ginnie Smart, MD  metoprolol succinate (TOPROL-XL) 25 MG 24 hr tablet Take 1 tablet (25 mg total) by mouth daily. 08/30/14  Yes Randall Hiss, MD  nicotine (NICODERM CQ -  DOSED IN MG/24 HOURS) 14 mg/24hr patch Place 1 patch (14 mg total) onto the skin daily. Patient not taking: Reported on 03/08/2015 02/28/15   Randall Hiss, MD  nicotine (NICODERM CQ - DOSED IN MG/24 HR) 7 mg/24hr patch Place 1 patch (7 mg total) onto the skin daily. Patient not taking: Reported on 03/08/2015 02/28/15   Randall Hiss, MD   BP 126/77 mmHg  Pulse 65  Temp(Src) 98.3 F (36.8 C) (Oral)  Resp 20  Ht 6' (1.829 m)  SpO2 100% Physical Exam  Constitutional: He is oriented to person, place, and time. He appears well-developed and well-nourished. No distress.  HENT:  Head: Normocephalic and atraumatic.  Mouth/Throat: Oropharynx is clear and moist. No oropharyngeal exudate.  Eyes: EOM are normal. Pupils are equal, round, and reactive to light.  Neck: Normal range of motion. Neck supple.  No posterior midline cervical tenderness to palpation. No meningismus.  Cardiovascular: Normal rate and regular rhythm.  Exam reveals no gallop and no friction rub.   No murmur heard. Pulmonary/Chest: Effort normal and breath sounds normal. No respiratory distress. He has no wheezes. He has no rales. He exhibits no tenderness.  Abdominal: Soft. Bowel sounds are normal. He exhibits no distension and no mass. There is tenderness (diffuse abdominal tenderness to palpation especially in the right side of the abdomen and upper abdomen. There is no rebound or guarding.). There is no rebound and no guarding.  Musculoskeletal: Normal range of motion. He exhibits no edema or tenderness.  No CVA tenderness bilaterally. No midline thoracic or lumbar tenderness. No lower extremity swelling or pain.  Neurological: He is alert and oriented to person, place, and time.  5/5 motor in all extremities. Sensation is fully intact.  Skin: Skin is warm and dry. No rash noted. No erythema.  Psychiatric: He has a normal mood and affect. His behavior is normal.  Nursing note and vitals reviewed.   ED Course   Procedures (including critical care time) DIAGNOSTIC STUDIES: Oxygen Saturation is 99% on RA, normal by my interpretation.    COORDINATION OF CARE: 12:55 AM-Discussed treatment plan which includes labs with pt at bedside and pt agreed to plan.   Labs Review Labs Reviewed  LIPASE, BLOOD - Abnormal; Notable for the following:    Lipase 18 (*)    All other components within normal limits  COMPREHENSIVE METABOLIC PANEL - Abnormal; Notable for the following:    Sodium 133 (*)    Chloride 98 (*)    Glucose, Bld 139 (*)    Creatinine, Ser 1.27 (*)    Calcium 8.7 (*)    Albumin 3.2 (*)    ALT 11 (*)    All other components within normal limits  CBC - Abnormal; Notable for the following:    WBC 11.4 (*)    All other components within normal limits  URINALYSIS, ROUTINE W REFLEX MICROSCOPIC (  NOT AT [facility]) - Abnormal; Notable for the following:    Color, Urine STRAW (*)    Specific Gravity, Urine 1.002 (*)    Leukocytes, UA TRACE (*)    All other components within normal limits  URINE MICROSCOPIC-ADD ON - Abnormal; Notable for the following:    Squamous Epithelial / LPF FEW (*)    All other components within normal limits  C DIFFICILE QUICK SCREEN W PCR REFLEX  OVA AND PARASITE EXAMINATION  AFB STAIN  GI PATHOGEN PANEL BY PCR, STOOL  CBC  BASIC METABOLIC PANEL   Imaging Review Dg Chest 2 View  03/08/2015   CLINICAL DATA:  Initial evaluation for possible infiltrate.  EXAM: CHEST  2 VIEW  COMPARISON:  Prior radiograph from 02/16/2015.  FINDINGS: The cardiac and mediastinal silhouettes are stable in size and contour, and remain within normal limits.  The lungs are normally inflated. No airspace consolidation, pleural effusion, or pulmonary edema is identified. There is no pneumothorax.  No acute osseous abnormality identified.  IMPRESSION: No active cardiopulmonary disease.   Electronically Signed   By: Rise Mu M.D.   On: 03/08/2015 06:08   Ct Abdomen Pelvis W  Contrast  03/08/2015   CLINICAL DATA:  39 year old male with severe left lower quadrant abdominal pain and diarrhea.  EXAM: CT ABDOMEN AND PELVIS WITH CONTRAST  TECHNIQUE: Multidetector CT imaging of the abdomen and pelvis was performed using the standard protocol following bolus administration of intravenous contrast.  CONTRAST:  25mL OMNIPAQUE IOHEXOL 300 MG/ML SOLN, OMNIPAQUE IOHEXOL 300 MG/ML SOLN  COMPARISON:  CT dated 01/11/2015  FINDINGS: Right lung base nodular densities, new from prior study and are most compatible with pneumonia. Clinical correlation and follow-up recommended.  No intra-abdominal free air.  Trace free fluid within the pelvis.  The liver, gallbladder, pancreas, spleen, adrenal glands, kidneys, visualized ureters, and urinary bladder appear unremarkable. The prostate and seminal vesicles are grossly unremarkable.  There is apparent mild diffuse thickening of the colon and rectum with enhancement of the colonic mucosa most compatible with colitis. Clinical correlation is recommended. Loose stool noted throughout the colon compatible with diarrheal state. There is no evidence of bowel obstruction. The appendix is unremarkable.  The visualized abdominal aorta and IVC appear unremarkable. No portal venous gas identified. There is no lymphadenopathy. Stable or slightly decreased right iliac chain lymph node noted. The osseous structures are unremarkable. The  IMPRESSION: Pancolitis may be related to an inflammatory process such is ulcerative colitis or infectious in etiology related to C difficile. Correlation with history of recent antibiotic use, clinical exam, and stool cultures recommended.  Diarrheal state.  Right lung base nodularity concerning for pneumonia. Clinical correlation and follow-up recommended.   Electronically Signed   By: Elgie Collard M.D.   On: 03/08/2015 03:06    EKG Interpretation   Date/Time:  Tuesday March 08 2015 01:32:02 EDT Ventricular Rate:  70 PR  Interval:  190 QRS Duration: 86 QT Interval:  384 QTC Calculation: 414 R Axis:   65 Text Interpretation:  Sinus rhythm Borderline T abnormalities, lateral  leads Baseline wander in lead(s) V6 Confirmed by Ranae Palms  MD, Aryona Sill  ([id number]) on 03/08/2015 3:58:42 AM      MDM   Final diagnoses:  Colitis  Diarrhea    I personally performed the services described in this documentation, which was scribed in my presence. The recorded information has been reviewed and is accurate.   Discussed with family medicine and will evaluate the patient in the  emergency department. No further diarrhea. CT scanner with diffuse pancolitis. Family medicine will admit to telemetry bed.  Loren Racer, MD 03/09/15 [phone]

## 2015-03-08 NOTE — ED Notes (Signed)
Called CT, spoke to [place], as patient is ready for transport.

## 2015-03-09 DIAGNOSIS — K529 Noninfective gastroenteritis and colitis, unspecified: Principal | ICD-10-CM

## 2015-03-09 LAB — CBC
HCT: 39.1 % (ref 39.0–52.0)
HEMOGLOBIN: 12.5 g/dL — AB (ref 13.0–17.0)
MCH: 29.4 pg (ref 26.0–34.0)
MCHC: 32 g/dL (ref 30.0–36.0)
MCV: 92 fL (ref 78.0–100.0)
PLATELETS: 190 10*3/uL (ref 150–400)
RBC: 4.25 MIL/uL (ref 4.22–5.81)
RDW: 13.4 % (ref 11.5–15.5)
WBC: 7.7 10*3/uL (ref 4.0–10.5)

## 2015-03-09 LAB — OVA AND PARASITE EXAMINATION

## 2015-03-09 LAB — BASIC METABOLIC PANEL
Anion gap: 5 (ref 5–15)
BUN: <5 mg/dL — ABNORMAL LOW (ref 6–20)
CALCIUM: 8.4 mg/dL — AB (ref 8.9–10.3)
CO2: 29 mmol/L (ref 22–32)
CREATININE: 1.09 mg/dL (ref 0.61–1.24)
Chloride: 105 mmol/L (ref 101–111)
Glucose, Bld: 106 mg/dL — ABNORMAL HIGH (ref 65–99)
Potassium: 3.6 mmol/L (ref 3.5–5.1)
SODIUM: 139 mmol/L (ref 135–145)

## 2015-03-09 MED ORDER — METRONIDAZOLE 500 MG PO TABS: 500.0000 mg | ORAL_TABLET | Freq: Three times a day (TID) | ORAL | Status: DC

## 2015-03-09 MED ORDER — CIPROFLOXACIN HCL 500 MG PO TABS: 500.0000 mg | ORAL_TABLET | Freq: Two times a day (BID) | ORAL | Status: DC

## 2015-03-09 MED ORDER — METRONIDAZOLE 500 MG PO TABS
500.0000 mg | ORAL_TABLET | Freq: Three times a day (TID) | ORAL | Status: DC
  Administered 2015-03-09: 500 mg via ORAL
  Filled 2015-03-09: qty 1

## 2015-03-09 MED ORDER — CIPROFLOXACIN HCL 500 MG PO TABS
500.0000 mg | ORAL_TABLET | Freq: Two times a day (BID) | ORAL | Status: DC
  Administered 2015-03-09: 500 mg via ORAL
  Filled 2015-03-09: qty 1

## 2015-03-09 NOTE — Discharge Summary (Signed)
Family Medicine Teaching [facility] Discharge Summary  Patient name: Phillip Gill Medical record number: [id number] Date of birth: Apr 28, 1976 Age: 39 y.o. Gender: male Date of Admission: 03/08/2015  Date of Discharge: 03/09/2015  Admitting Physician: Doreene Eland, MD  Primary Care Provider: Clare Gandy, MD Consultants: none  Indication for Hospitalization:  [place], acute kidney injury  Discharge Diagnoses/Problem List:  Patient Active Problem List   Diagnosis Date Noted  . Colitis 03/08/2015  . Diarrhea   . HIV (human immunodeficiency virus infection)   . Tobacco abuse   . HIV disease 02/28/2015  . Leukocytosis   . LGV (lymphogranuloma venereum)   . Primary syphilis   . 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 Inguinal adenopathy 10/03/2014  . Human immunodeficiency virus (HIV) disease   . Essential hypertension   . Gynecomastia 04/30/2014  . 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 Tobacco dependence 06/03/2012  . Fibromyalgia muscle pain 02/11/2012  . Depression 12/07/2011  . Anxiety 02/02/2011     Disposition: Home  Discharge Condition: Stable  Discharge Exam:  Temp: [97.7 F (36.5 C)-98.1 F (36.7 C)] 97.9 F (36.6 C) (09/06 0627) Pulse Rate: [59-88] 65 (09/06 0627) Resp: [14-19] 18 (09/06 0627) BP: (115-150)/(77-93) 132/84 mmHg (09/06 0627) SpO2: [93 %-100 %] 100 % (09/06 2130) Physical Exam: General: NAD, sitting up in bed eating breakfast comfortably Cardiovascular: RRR Respiratory: CTAB Abdomen: soft, mildly tender diffusely, mild guarding, non distended, no rebound Extremities: no LE edema or tenderness   Brief Hospital Course:  Phillip Gill is a 39 y.o. male who presented with diarrhea and abdominal painfor three days. PMH is significant for HIV (well controlled on Genvoya last CD4 1360 on 02/14/2015). Chronic Hepatitis B, HTN, Hx of Primary Syphilis and Chlamydia in 10/2014 with Lymphogranuloma [name] Treated and resolved.   On  presentation to the ED he was noted to have a mildly elevated WBC to 11.4. He had a CT which showed pancolitis. He was started on IV ciprofloxacin and metronidazole. Given his recent hospitalization and social risk factors ( Male who has sex with men) C Diff PCR, stool ova/parasite, GI pathogen panel were collected. C Diff was negative. The other stool studies were pending by day of discharge. By HD 2, his diarrhea abd improved, his WBC was normal to 7.7 and his abdominal pain had improved. He was noted to tolerate PO well. His antibiotics were converted to oral on HD2 He was discharged to complete a 7 day course of antibiotic with close follow up  Issues for Follow Up:  1. Stool ova and parasite study, GI pathogen panel  Significant Procedures:  None  Significant Labs and Imaging:   Recent Labs Lab 03/08/15 0100 03/09/15 0557  WBC 11.4* 7.7  HGB 13.5 12.5*  HCT 41.1 39.1  PLT 197 190    Recent Labs Lab 03/08/15 0100 03/09/15 0557  NA 133* 139  K 3.5 3.6  CL 98* 105  CO2 29 29  GLUCOSE 139* 106*  BUN 6 <5*  CREATININE 1.27* 1.09  CALCIUM 8.7* 8.4*  ALKPHOS 62  --   AST 24  --   ALT 11*  --   ALBUMIN 3.2*  --       Results/Tests Pending at Time of Discharge:  Stool ova and parasite Study GI pathogen panel  Discharge Medications:    Medication List    ASK your doctor about these medications        elvitegravir-cobicistat-emtricitabine-tenofovir 150-150-200-10 MG Tabs tablet  Commonly known as:  GENVOYA  Take 1 tablet by mouth daily with breakfast.     imiquimod 5 % cream  Commonly known as:  ALDARA  APPLY TO THE AFFECTED AREAS THREE TIMES PER WEEK     lisinopril 20 MG tablet  Commonly known as:  PRINIVIL,ZESTRIL  TAKE 1 TABLET BY MOUTH EVERY DAY     metoprolol succinate 25 MG 24 hr tablet  Commonly known as:  TOPROL-XL  Take 1 tablet (25 mg total) by mouth daily.     nicotine 14 mg/24hr patch  Commonly known as:  NICODERM CQ - dosed in mg/24 hours   Place 1 patch (14 mg total) onto the skin daily.     nicotine 7 mg/24hr patch  Commonly known as:  NICODERM CQ - dosed in mg/24 hr  Place 1 patch (7 mg total) onto the skin daily.        Discharge Instructions: Please refer to Patient Instructions section of EMR for full details.  Patient was counseled important signs and symptoms that should prompt return to medical care, changes in medications, dietary instructions, activity restrictions, and follow up appointments.   Follow-Up Appointments:   Bonney Aid, MD 03/09/2015, 9:49 AM PGY-2, [facility]

## 2015-03-09 NOTE — Care Management Note (Signed)
Case Management Note  Patient Details  Name: Phillip Gill MRN: [id number] Date of Birth: 1976-03-09  Subjective/Objective:                 Patient from home, self care. Admitted with Colitis. Last CD4 8/15 was 1360, pt reports compliance with HIV regimen. Will continue Genvoyawhile in hospital.   03-09-15 Spoke with patient, he is seen by Dr Daiva Eves at the ID clinic, he receives his meds through the [employer] program. Patient denies any other CM assistance available.    Action/Plan:  DC to self care.  Expected Discharge Date:  03/11/15               Expected Discharge Plan:  Home/Self Care  In-House Referral:     Discharge planning Services  CM Consult  Post Acute Care Choice:    Choice offered to:     DME Arranged:    DME Agency:     HH Arranged:    HH Agency:     Status of Service:  In process, will continue to follow  Medicare Important Message Given:    Date Medicare IM Given:    Medicare IM give by:    Date Additional Medicare IM Given:    Additional Medicare Important Message give by:     If discussed at Long Length of Stay Meetings, dates discussed:    Additional Comments:  [name], RN 03/09/2015, 11:24 AM

## 2015-03-09 NOTE — Progress Notes (Signed)
Reviewed discharge paperwork with pt.  PIV removed.  Pt denied any needs at this time.  Escorted pt to the ED where his car was located.

## 2015-03-09 NOTE — Progress Notes (Signed)
Family Medicine Teaching Service Daily Progress Note Intern Pager: [phone]  Patient name: Phillip Gill Medical record number: [id number] Date of birth: 1975-07-24 Age: 39 y.o. Gender: male  Primary Care Provider: Clare Gandy, MD Consultants: None Code Status: Full  Pt Overview and Major Events to Date:  9/6: Admit for diarrhea  Assessment and Plan:  Acute Diarrhea: Improving diarrhea with BMs becoming more formed, abd pain improving. Last BM at around 4 am - f/u c diff, ova/parasites, GI pathogen panel - MIVF, s/p 2L bolus NS in the ED - trend CBC - enteric precautions - IV cipro/flagyl--> convert to oral today 9/7 to  Continue 7 day course  Leukocytosis: Resolved, WBC 7.7 this AM - follow CBC  AKI- Cr 1.27-->> 1.09, his normal seems to be between .98-1.26.  - MIVF as above, encourage good PO - Trend CR - I/Os  Hepatitis B - Stable, normal LFTs, bilirubin.  - Trend CMP  HIV - Last CD4 8/15 was 1360, pt reports compliance with HIV regimen.  - Continue Genvoya - Trend CBC as above - AFB ordered  Hx of Primary Syhpilis and Chlamydia - History of + Chlamydia in 10/2014 s/p treatment and subsequent negative results,. Pt continues to have sex with men but uses protection. No lesions or inguinal lymphadenopathy on exam at this time   # HTN - Stable blood pressures -Hold home lisinopril 20 qD until AKI improves - add metoprolol 25 qD  FEN/GI: Regular Diet. MIVF.  Prophylaxis: Lovenox.  Dispo: Pending clinical improvement   Subjective:  Feels improving abd pain. Stools becoming for formed.  Had some nausea this AM, but no emesis, has been able to tolerate PO well Denies SOB or chest pain  Objective: Temp:  [97.9 F (36.6 C)-98.3 F (36.8 C)] 97.9 F (36.6 C) (09/07 0517) Pulse Rate:  [65-68] 68 (09/07 0517) Resp:  [18-20] 18 (09/07 0517) BP: (125-137)/(76-87) 137/87 mmHg (09/07 0517) SpO2:  [100 %] 100 % (09/07 0517) Physical Exam: General: NAD,  sitting up in bed eating breakfast comfortably Cardiovascular: RRR Respiratory: CTAB Abdomen: soft, mildly tender most at the left lower quadrant, but no guarding, non distended, no rebound Extremities: no LE edema or tenderness  Laboratory:  Recent Labs Lab 03/08/15 0100 03/09/15 0557  WBC 11.4* 7.7  HGB 13.5 12.5*  HCT 41.1 39.1  PLT 197 190    Recent Labs Lab 03/08/15 0100 03/09/15 0557  NA 133* 139  K 3.5 3.6  CL 98* 105  CO2 29 29  BUN 6 <5*  CREATININE 1.27* 1.09  CALCIUM 8.7* 8.4*  PROT 6.8  --   BILITOT 0.6  --   ALKPHOS 62  --   ALT 11*  --   AST 24  --   GLUCOSE 139* 106*      Imaging/Diagnostic Tests: CXR:  Pancolitis may be related to an inflammatory process such is ulcerative colitis or infectious in etiology related to C difficile. Correlation with history of recent antibiotic use, clinical exam, and stool cultures recommended.  Diarrheal state.  Right lung base nodularity concerning for pneumonia. Clinical correlation and follow-up recommended.  IMPRESSION: No active cardiopulmonary disease.  CT abd:    Bonney Aid, MD 03/09/2015, 8:14 AM PGY-2,  Family Medicine FPTS Intern pager: [phone], text pages welcome

## 2015-03-09 NOTE — Discharge Instructions (Signed)
You were admitted for diarrhea which has improved. If you ave worsening diarrhea or cannot tolerate food or drink, please call the family medicine office  You have follow up scheduled as follows:  Follow-up Information    Follow up with Clare Gandy, MD On 03/15/2015.   Specialty:  Family Medicine   Why:  2:00 pm   Contact information:   [address] [place] [address] [phone]       Chronic Diarrhea Diarrhea is frequent loose and watery bowel movements. It can cause you to feel weak and dehydrated. Dehydration can cause you to become tired and thirsty and to have a dry mouth, decreased urination, and dark yellow urine. Diarrhea is a sign of another problem, most often an infection that will not last long. In most cases, diarrhea lasts 2-3 days. Diarrhea that lasts longer than 4 weeks is called long-lasting (chronic) diarrhea. It is important to treat your diarrhea as directed by your health care provider to lessen or prevent future episodes of diarrhea.  CAUSES  There are many causes of chronic diarrhea. The following are some possible causes:   Gastrointestinal infections caused by viruses, bacteria, or parasites.   Food poisoning or food allergies.   Certain medicines, such as antibiotics, chemotherapy, and laxatives.   Artificial sweeteners and fructose.   Digestive disorders, such as celiac disease and inflammatory bowel diseases.   Irritable bowel syndrome.  Some disorders of the pancreas.  Disorders of the thyroid.  Reduced blood flow to the intestines.  Cancer. Sometimes the cause of chronic diarrhea is unknown. RISK FACTORS  Having a severely weakened immune system, such as from HIV or AIDS.   Taking certain types of cancer-fighting drugs (such as with chemotherapy) or other medicines.   Having had a recent organ transplant.   Having a portion of the stomach or small bowel removed.   Traveling to countries where food and water supplies  are often contaminated.  SYMPTOMS  In addition to frequent, loose stools, diarrhea may cause:   Cramping.   Abdominal pain.   Nausea.   Fever.  Fatigue.  Urgent need to use the bathroom.  Loss of bowel control. DIAGNOSIS  Your health care provider must take a careful history and perform a physical exam. Tests given are based on your symptoms and history. Tests may include:   Blood or stool tests. Three or more stool samples may be examined. Stool cultures may be used to test for bacteria or parasites.   X-rays.   A procedure in which a thin tube is inserted into the mouth or rectum (endoscopy). This allows the health care provider to look inside the intestine.  TREATMENT   Treatment is aimed at correcting the cause of the diarrhea when possible.  Diarrhea caused by an infection can often be treated with antibiotic medicines.  Diarrhea not caused by an infection may require you to take long-term medicine or have surgery. Specific treatment should be discussed with your health care provider.  If the cause cannot be determined, treatment aims to relieve symptoms and prevent dehydration. Serious health problems can occur if you do not maintain proper fluid levels. Treatment may include:  Taking an oral rehydration solution (ORS).  Not drinking beverages that contain caffeine (such as tea, coffee, and soft drinks).  Not drinking alcohol.  Maintaining well-balanced nutrition to help you recover faster. HOME CARE INSTRUCTIONS   Drink enough fluids to keep urine clear or pale yellow. Drink 1 cup (8 oz) of fluid  for each diarrhea episode. Avoid fluids that contain simple sugars, fruit juices, whole milk products, and sodas. Hydrate with an ORS. You may purchase the ORS or prepare it at home by mixing the following ingredients together:   - tsp (1.7-3  mL) table salt.   tsp (3  mL) baking soda.   tsp (1.7 mL) salt substitute containing potassium chloride.  1 tbsp  (20 mL) sugar.  4.2 c (1 L) of water.   Certain foods and beverages may increase the speed at which food moves through the gastrointestinal (GI) tract. These foods and beverages should be avoided. They include:  Caffeinated and alcoholic beverages.  High-fiber foods, such as raw fruits and vegetables, nuts, seeds, and whole grain breads and cereals.  Foods and beverages sweetened with sugar alcohols, such as xylitol, sorbitol, and mannitol.   Some foods may be well tolerated and may help thicken stool. These include:  Starchy foods, such as rice, toast, pasta, low-sugar cereal, oatmeal, grits, baked potatoes, crackers, and bagels.  Bananas.  Applesauce.  Add probiotic-rich foods to help increase healthy bacteria in the GI tract. These include yogurt and fermented milk products.  Wash your hands well after each diarrhea episode.  Only take over-the-counter or prescription medicines as directed by your health care provider.  Take a warm bath to relieve any burning or pain from frequent diarrhea episodes. SEEK MEDICAL CARE IF:   You are not urinating as often.  Your urine is a dark color.  You become very tired or dizzy.  You have severe pain in the abdomen or rectum.  Your have blood or pus in your stools.  Your stools look black and tarry. SEEK IMMEDIATE MEDICAL CARE IF:   You are unable to keep fluids down.  You have persistent vomiting.  You have blood in your stool.  Your stools are black and tarry.  You do not urinate in 6-8 hours, or there is only a small amount of very dark urine.  You have abdominal pain that increases or localizes.  You have weakness, dizziness, confusion, or lightheadedness.  You have a severe headache.  Your diarrhea gets worse or does not get better.  You have a fever or persistent symptoms for more than 2-3 days.  You have a fever and your symptoms suddenly get worse. MAKE SURE YOU:   Understand these instructions.  Will  watch your condition.  Will get help right away if you are not doing well or get worse. Document Released: 09/08/2003 Document Revised: 06/23/2013 Document Reviewed: 12/11/2012 [facility] Patient Information 2015 [place], [place]. This information is not intended to replace advice given to you by your health care provider. Make sure you discuss any questions you have with your health care provider.

## 2015-03-10 LAB — GI PATHOGEN PANEL BY PCR, STOOL
C difficile toxin A/B: NOT DETECTED
CRYPTOSPORIDIUM BY PCR: NOT DETECTED
Campylobacter by PCR: NOT DETECTED
E COLI (STEC): NOT DETECTED
E coli 0157 by PCR: NOT DETECTED
G lamblia by PCR: NOT DETECTED
NOROVIRUS G1/G2: NOT DETECTED
ROTAVIRUS A BY PCR: NOT DETECTED
Salmonella by PCR: NOT DETECTED
Shigella by PCR: NOT DETECTED

## 2015-03-15 ENCOUNTER — Ambulatory Visit (INDEPENDENT_AMBULATORY_CARE_PROVIDER_SITE_OTHER): Payer: BLUE CROSS/BLUE SHIELD | Admitting: Family Medicine

## 2015-03-15 VITALS — BP 148/95 | HR 72 | Temp 99.6°F | Ht 72.0 in | Wt 169.8 lb

## 2015-03-15 DIAGNOSIS — K529 Noninfective gastroenteritis and colitis, unspecified: Secondary | ICD-10-CM

## 2015-03-15 DIAGNOSIS — I10 Essential (primary) hypertension: Secondary | ICD-10-CM

## 2015-03-15 NOTE — Assessment & Plan Note (Signed)
BP 142/84 upon manual re-check  He is on lisinopril  - DASH diet and exercise recommended  - consider increasing ACE i vs adding HCTZ  - f/u in 2-4 months.

## 2015-03-15 NOTE — Patient Instructions (Signed)
Thank you for coming in,   I am glad that your symptoms have improved.   Please follow up with me in 3-5 months for blood pressure.   Please bring all of your medications with you to each visit.   Sign up for My Chart to have easy access to your labs results, and communication with your Primary care physician   Please feel free to call with any questions or concerns at any time, at [phone]. --Dr. Jordan Likes

## 2015-03-15 NOTE — Progress Notes (Signed)
Subjective:    Phillip Gill - 39 y.o. male MRN [id number]  Date of birth: 1976-01-11  HPI  Phillip Gill is here for hospital f/u.  Hospital f/u:  He was admitted for ab pain and diarrhea.  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  He denies any ab pain and has improvement in his appetite.  He no longer has diarrhea and stools have returned to normal.   HTN Disease Monitoring: Home BP Monitoring none  Medications:lisinopril  Chest pain- no     Dyspnea- no Compliance-  yes.  Lightheadedness-  no   Edema- no  Health Maintenance:  There are no preventive care reminders to display for this patient.  -  reports that he has been smoking Cigarettes.  He has a 9 pack-year smoking history. He has never used smokeless tobacco. - Review of Systems: Per HPI. - Past Medical History: Patient Active Problem List   Diagnosis Date Noted  . Colitis 03/08/2015  . Diarrhea   . HIV (human immunodeficiency virus infection)   . Tobacco abuse   . HIV disease 02/28/2015  . Leukocytosis   . LGV (lymphogranuloma venereum)   . Primary syphilis   . 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 Inguinal adenopathy 10/03/2014  . Human immunodeficiency virus (HIV) disease   . Essential hypertension   . Gynecomastia 04/30/2014  . 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 Tobacco dependence 06/03/2012  . Fibromyalgia muscle pain 02/11/2012  . Depression 12/07/2011  . Anxiety 02/02/2011   - Medications: reviewed and updated Current Outpatient Prescriptions  Medication Sig Dispense Refill  . ciprofloxacin (CIPRO) 500 MG tablet Take 1 tablet (500 mg total) by mouth 2 (two) times daily. 10 tablet 0  . elvitegravir-cobicistat-emtricitabine-tenofovir (GENVOYA) 150-150-200-10 MG TABS tablet Take 1 tablet by mouth daily with breakfast. 30 tablet 11  . imiquimod (ALDARA) 5 % cream APPLY TO THE AFFECTED AREAS THREE TIMES PER WEEK 12 each 1  . lisinopril  (PRINIVIL,ZESTRIL) 20 MG tablet TAKE 1 TABLET BY MOUTH EVERY DAY 30 tablet 6  . metoprolol succinate (TOPROL-XL) 25 MG 24 hr tablet Take 1 tablet (25 mg total) by mouth daily. 30 tablet 11  . metroNIDAZOLE (FLAGYL) 500 MG tablet Take 1 tablet (500 mg total) by mouth 3 (three) times daily. 15 tablet 0  . nicotine (NICODERM CQ - DOSED IN MG/24 HOURS) 14 mg/24hr patch Place 1 patch (14 mg total) onto the skin daily. (Patient not taking: Reported on 03/08/2015) 30 patch 1  . nicotine (NICODERM CQ - DOSED IN MG/24 HR) 7 mg/24hr patch Place 1 patch (7 mg total) onto the skin daily. (Patient not taking: Reported on 03/08/2015) 7 patch 14  . [DISCONTINUED] gabapentin (NEURONTIN) 300 MG capsule Take 1 capsule (300 mg total) by mouth 3 (three) times daily. 90 capsule 3   No current facility-administered medications for this visit.     Review of Systems See HPI     Objective:   Physical Exam BP 148/95 mmHg  Pulse 72  Temp(Src) 99.6 F (37.6 C) (Oral)  Ht 6' (1.829 m)  Wt 169 lb 12.8 oz (77.021 kg)  BMI 23.02 kg/m2 Gen: NAD, alert, cooperative with exam,  Resp:  non-labored Abd: SNTND, BS present, no guarding or organomegaly Skin: no rashes, normal turgor  Neuro: no gross deficits.      Assessment & Plan:   Colitis Has improved and no longer symptomatic  Completed ABX  Lab work  revealing for ETEC.  - f/u PRN   Essential hypertension BP 142/84 upon manual re-check  He is on lisinopril  - DASH diet and exercise recommended  - consider increasing ACE i vs adding HCTZ  - f/u in 2-4 months.

## 2015-03-15 NOTE — Assessment & Plan Note (Signed)
Has improved and no longer symptomatic  Completed ABX  Lab work revealing for ETEC.  - f/u PRN

## 2015-03-30 ENCOUNTER — Other Ambulatory Visit: Payer: Self-pay | Admitting: Student

## 2015-03-30 NOTE — Telephone Encounter (Signed)
Refill request from pharmacy. Will forward to MD covering PCP and PCP for review. Adams,Latoya, CMA.

## 2015-03-30 NOTE — Telephone Encounter (Signed)
This medication was prescribed in 10/2014 by infectious disease with 11 refills. PCP has not prescribed this as far as I can tell. Either way, he should have refills remaining.

## 2015-04-06 NOTE — Telephone Encounter (Signed)
LVM for pt to call back to inform him of below. Zimmerman Rumple, April D, CMA  

## 2015-04-08 ENCOUNTER — Emergency Department (HOSPITAL_COMMUNITY)
Admission: EM | Admit: 2015-04-08 | Discharge: 2015-04-09 | Disposition: A | Discharge status: Home / Self Care | Payer: BLUE CROSS/BLUE SHIELD | Attending: Emergency Medicine | Admitting: Emergency Medicine

## 2015-04-08 ENCOUNTER — Encounter (HOSPITAL_COMMUNITY): Payer: Self-pay | Admitting: Emergency Medicine

## 2015-04-08 DIAGNOSIS — Z8739 Personal history of other diseases of the musculoskeletal system and connective tissue: Secondary | ICD-10-CM | POA: Diagnosis not present

## 2015-04-08 DIAGNOSIS — Z8719 Personal history of other diseases of the digestive system: Secondary | ICD-10-CM | POA: Diagnosis not present

## 2015-04-08 DIAGNOSIS — G43909 Migraine, unspecified, not intractable, without status migrainosus: Secondary | ICD-10-CM | POA: Insufficient documentation

## 2015-04-08 DIAGNOSIS — F410 Panic disorder [episodic paroxysmal anxiety] without agoraphobia: Secondary | ICD-10-CM | POA: Insufficient documentation

## 2015-04-08 DIAGNOSIS — Z720 Tobacco use: Secondary | ICD-10-CM | POA: Insufficient documentation

## 2015-04-08 DIAGNOSIS — B20 Human immunodeficiency virus [HIV] disease: Secondary | ICD-10-CM | POA: Insufficient documentation

## 2015-04-08 DIAGNOSIS — Z79899 Other long term (current) drug therapy: Secondary | ICD-10-CM | POA: Insufficient documentation

## 2015-04-08 DIAGNOSIS — G8929 Other chronic pain: Secondary | ICD-10-CM | POA: Diagnosis not present

## 2015-04-08 DIAGNOSIS — Z8659 Personal history of other mental and behavioral disorders: Secondary | ICD-10-CM

## 2015-04-08 DIAGNOSIS — R0602 Shortness of breath: Secondary | ICD-10-CM | POA: Insufficient documentation

## 2015-04-08 DIAGNOSIS — Z87448 Personal history of other diseases of urinary system: Secondary | ICD-10-CM | POA: Insufficient documentation

## 2015-04-08 DIAGNOSIS — I10 Essential (primary) hypertension: Secondary | ICD-10-CM | POA: Diagnosis not present

## 2015-04-08 NOTE — ED Notes (Signed)
Pt. reports panic/anxiety attack today , denies suicidal ideation / no hallucinations . Respirations unlabored .

## 2015-04-09 MED ORDER — LORAZEPAM 1 MG PO TABS: 1.0000 mg | ORAL_TABLET | Freq: Three times a day (TID) | ORAL | Status: DC | PRN

## 2015-04-09 MED ORDER — LORAZEPAM 1 MG PO TABS
1.0000 mg | ORAL_TABLET | Freq: Once | ORAL | Status: AC
  Administered 2015-04-09: 1 mg via ORAL
  Filled 2015-04-09: qty 1

## 2015-04-09 NOTE — ED Notes (Signed)
Pt sleeping soundly, had to waken up to give ativan.

## 2015-04-09 NOTE — Discharge Instructions (Signed)
We saw you in the ER for the panic attacks. All the results in the ER are normal - you appear comfortable and our exam is normal.  The workup in the ER is not complete, and is limited to screening for life threatening and emergent conditions only, so please see a primary care doctor for further evaluation.  Panic Attacks Panic attacks are sudden, short-livedsurges of severe anxiety, fear, or discomfort. They may occur for no reason when you are relaxed, when you are anxious, or when you are sleeping. Panic attacks may occur for a number of reasons:   Healthy people occasionally have panic attacks in extreme, life-threatening situations, such as war or natural disasters. Normal anxiety is a protective mechanism of the body that helps [place] react to danger (fight or flight response).  Panic attacks are often seen with anxiety disorders, such as panic disorder, social anxiety disorder, generalized anxiety disorder, and phobias. Anxiety disorders cause excessive or uncontrollable anxiety. They may interfere with your relationships or other life activities.  Panic attacks are sometimes seen with other mental illnesses, such as depression and posttraumatic stress disorder.  Certain medical conditions, prescription medicines, and drugs of abuse can cause panic attacks. SYMPTOMS  Panic attacks start suddenly, peak within 20 minutes, and are accompanied by four or more of the following symptoms:  Pounding heart or fast heart rate (palpitations).  Sweating.  Trembling or shaking.  Shortness of breath or feeling smothered.  Feeling choked.  Chest pain or discomfort.  Nausea or strange feeling in your stomach.  Dizziness, light-headedness, or feeling like you will faint.  Chills or hot flushes.  Numbness or tingling in your lips or hands and feet.  Feeling that things are not real or feeling that you are not yourself.  Fear of losing control or going crazy.  Fear of dying. Some of these  symptoms can mimic serious medical conditions. For example, you may think you are having a heart attack. Although panic attacks can be very scary, they are not life threatening. DIAGNOSIS  Panic attacks are diagnosed through an assessment by your health care provider. Your health care provider will ask questions about your symptoms, such as where and when they occurred. Your health care provider will also ask about your medical history and use of alcohol and drugs, including prescription medicines. Your health care provider may order blood tests or other studies to rule out a serious medical condition. Your health care provider may refer you to a mental health professional for further evaluation. TREATMENT   Most healthy people who have one or two panic attacks in an extreme, life-threatening situation will not require treatment.  The treatment for panic attacks associated with anxiety disorders or other mental illness typically involves counseling with a mental health professional, medicine, or a combination of both. Your health care provider will help determine what treatment is best for you.  Panic attacks due to physical illness usually go away with treatment of the illness. If prescription medicine is causing panic attacks, talk with your health care provider about stopping the medicine, decreasing the dose, or substituting another medicine.  Panic attacks due to alcohol or drug abuse go away with abstinence. Some adults need professional help in order to stop drinking or using drugs. HOME CARE INSTRUCTIONS   Take all medicines as directed by your health care provider.   Schedule and attend follow-up visits as directed by your health care provider. It is important to keep all your appointments. SEEK MEDICAL CARE  IF:  You are not able to take your medicines as prescribed.  Your symptoms do not improve or get worse. SEEK IMMEDIATE MEDICAL CARE IF:   You experience panic attack symptoms  that are different than your usual symptoms.  You have serious thoughts about hurting yourself or others.  You are taking medicine for panic attacks and have a serious side effect. MAKE SURE YOU:  Understand these instructions.  Will watch your condition.  Will get help right away if you are not doing well or get worse.   This information is not intended to replace advice given to you by your health care provider. Make sure you discuss any questions you have with your health care provider.   Document Released: 06/18/2005 Document Revised: 06/23/2013 Document Reviewed: 01/30/2013 Elsevier Interactive Patient Education [employer]

## 2015-04-09 NOTE — ED Provider Notes (Signed)
CSN: [id number]     Arrival date & time 04/08/15  2312 History  By signing my name below, I, Emmanuella Mensah, attest that this documentation has been prepared under the direction and in the presence of Derwood Kaplan, MD. Electronically Signed: Angelene Giovanni, ED Scribe. 04/09/2015. 12:43 AM.     Chief Complaint  Patient presents with  . Panic Attack   The history is provided by the patient. No language interpreter was used.   HPI Comments: Phillip Gill is a 39 y.o. male with a hx of HIV, anxiety, and depression who presents to the Emergency Department status post anxiety attack that occurred approx 3 hours ago, waking him from his sleep. He reports associated shallow breathing and anxiety. He denies any fever, chills, diaphoresis, chest tightness, chest pain, or nausea. He denies currently being on any medication for anxiety and panic attacks. He reports that his panic attacks happen once in a while. He denies any current stress or anything specific that could cause his symptoms. He denies any drug or alcohol use.   Past Medical History  Diagnosis Date  . Anxiety   . Depression   . HIV infection (HCC)     followed by Dr. Luciana Axe- sees him every 4 months  . Hypertension   . 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 IBS (irritable bowel syndrome)   . Renal insufficiency 12/29/2013  . Migraine     "none in years" (02/16/2015  . Arthritis     "neck" (02/16/2015)  . Fibromyalgia   . Chronic lower back pain   . HIV disease (HCC) 02/28/2015   Past Surgical History  Procedure Laterality Date  . Wisdom tooth extraction    . High resolution anoscopy N/A 02/11/2013    Procedure: HIGH RESOLUTION ANOSCOPY WITH BIOPSY, LASER ABLATION;  Surgeon: Romie Levee, MD;  Location: [facility];  Service: General;  Laterality: N/A;  . Co2 laser application N/A 02/11/2013    Procedure: CO2 LASER APPLICATION;  Surgeon: Romie Levee, MD;  Location: [facility];  Service: General;  Laterality: N/A;   Family History  Problem Relation Age of Onset  . Mental illness Neg Hx   . Hypertension Mother   . Diabetes Mother   . Stroke Mother     cerbral aneurysm  . Hypertension Brother    Social History  Substance Use Topics  . Smoking status: Current Every Day Smoker -- 0.00 packs/day for 18 years    Types: Cigarettes  . Smokeless tobacco: Never Used  . Alcohol Use: Yes    Review of Systems  Constitutional: Negative for fever, chills and diaphoresis.  Respiratory: Positive for shortness of breath. Negative for chest tightness.   Cardiovascular: Negative for chest pain.  Gastrointestinal: Negative for nausea.  Psychiatric/Behavioral: The patient is nervous/anxious.       Allergies  Bactrim; Cymbalta; Other; Zoloft; and Neurontin  Home Medications   Prior to Admission medications   Medication Sig Start Date End Date Taking? Authorizing Provider  elvitegravir-cobicistat-emtricitabine-tenofovir (GENVOYA) 150-150-200-10 MG TABS tablet Take 1 tablet by mouth daily with breakfast. 10/20/14  Yes Randall Hiss, MD  lisinopril (PRINIVIL,ZESTRIL) 20 MG tablet TAKE 1 TABLET BY MOUTH EVERY DAY 11/02/14  Yes Ginnie Smart, MD  metoprolol succinate (TOPROL-XL) 25 MG 24 hr tablet Take 1 tablet (25 mg total) by mouth daily. 08/30/14  Yes Randall Hiss, MD  ciprofloxacin (CIPRO) 500 MG tablet Take 1 tablet (  500 mg total) by mouth 2 (two) times daily. Patient not taking: Reported on 04/08/2015 03/09/15   Bonney Aid, MD  imiquimod (ALDARA) 5 % cream APPLY TO THE AFFECTED AREAS THREE TIMES PER WEEK Patient not taking: Reported on 04/08/2015 02/14/15   Gardiner Barefoot, MD  LORazepam (ATIVAN) 1 MG tablet Take 1 tablet (1 mg total) by mouth 3 (three) times daily as needed for anxiety. 04/09/15   Derwood Kaplan, MD  metroNIDAZOLE (FLAGYL) 500 MG tablet Take 1 tablet (500 mg total) by mouth 3 (three) times daily. Patient not taking: Reported on  04/08/2015 03/09/15   Bonney Aid, MD  nicotine (NICODERM CQ - DOSED IN MG/24 HOURS) 14 mg/24hr patch Place 1 patch (14 mg total) onto the skin daily. Patient not taking: Reported on 03/08/2015 02/28/15   Randall Hiss, MD  nicotine (NICODERM CQ - DOSED IN MG/24 HR) 7 mg/24hr patch Place 1 patch (7 mg total) onto the skin daily. Patient not taking: Reported on 03/08/2015 02/28/15   Randall Hiss, MD   BP 136/93 mmHg  Pulse 65  Temp(Src) 97.7 F (36.5 C) (Oral)  Resp 16  SpO2 98% Physical Exam  Constitutional: He is oriented to person, place, and time. He appears well-developed and well-nourished.  HENT:  Head: Normocephalic and atraumatic.  Eyes:  Pupils 3mm and reactive to light No nystagmus   Cardiovascular: Normal rate and regular rhythm.   2+ radial and equal pulse bilaterally   Pulmonary/Chest: Effort normal. No respiratory distress. He has no wheezes. He has no rales.  Lungs clear to auscultation  Abdominal: He exhibits no distension.  Neurological: He is alert and oriented to person, place, and time.  Skin: Skin is warm and dry.  Psychiatric: He has a normal mood and affect.  Nursing note and vitals reviewed.   ED Course  Procedures (including critical care time) DIAGNOSTIC STUDIES: Oxygen Saturation is 98% on RA, normal by my interpretation.    COORDINATION OF CARE: 12:15 AM- Pt advised of plan for treatment and pt agrees.   Labs Review Labs Reviewed - No data to display  Imaging Review No results found. Derwood Kaplan, MD has personally reviewed and evaluated these images and lab results as part of his medical decision-making.   EKG Interpretation   Date/Time:  Saturday April 09 2015 00:47:22 EDT Ventricular Rate:  58 PR Interval:  204 QRS Duration: 82 QT Interval:  420 QTC Calculation: 412 R Axis:   76 Text Interpretation:  Sinus rhythm Borderline prolonged PR interval  Probable left ventricular hypertrophy Anterior Q waves, possibly due to   LVH Confirmed by Rhunette Croft, MD, Janey Genta [phone]) on 04/09/2015 1:22:18 AM      MDM   Final diagnoses:  History of panic attacks    I personally performed the services described in this documentation, which was scribed in my presence. The recorded information has been reviewed and is accurate.  Pt comes in with cc of panic attack. He reports waking up and feeling like his heart was racing and he was having to take shallow respirations. No chest pain. He is comfortable appearing. Will give him ativan prn. Pt reports being diagnosed with panic attacks, he is not on any meds. Screening ekf is normal.      Derwood Kaplan, MD 04/09/15 [id number]

## 2015-04-11 ENCOUNTER — Encounter: Payer: Self-pay | Admitting: Emergency Medicine

## 2015-04-11 ENCOUNTER — Emergency Department
Admission: EM | Admit: 2015-04-11 | Discharge: 2015-04-11 | Disposition: A | Discharge status: Home / Self Care | Payer: BLUE CROSS/BLUE SHIELD | Attending: Emergency Medicine | Admitting: Emergency Medicine

## 2015-04-11 DIAGNOSIS — Y9289 Other specified places as the place of occurrence of the external cause: Secondary | ICD-10-CM | POA: Diagnosis not present

## 2015-04-11 DIAGNOSIS — Y990 Civilian activity done for income or pay: Secondary | ICD-10-CM | POA: Insufficient documentation

## 2015-04-11 DIAGNOSIS — I10 Essential (primary) hypertension: Secondary | ICD-10-CM | POA: Diagnosis not present

## 2015-04-11 DIAGNOSIS — Z720 Tobacco use: Secondary | ICD-10-CM | POA: Insufficient documentation

## 2015-04-11 DIAGNOSIS — R519 Headache, unspecified: Secondary | ICD-10-CM

## 2015-04-11 DIAGNOSIS — M436 Torticollis: Secondary | ICD-10-CM | POA: Diagnosis not present

## 2015-04-11 DIAGNOSIS — R51 Headache: Secondary | ICD-10-CM

## 2015-04-11 DIAGNOSIS — S0083XA Contusion of other part of head, initial encounter: Secondary | ICD-10-CM | POA: Insufficient documentation

## 2015-04-11 DIAGNOSIS — S0990XA Unspecified injury of head, initial encounter: Secondary | ICD-10-CM | POA: Insufficient documentation

## 2015-04-11 DIAGNOSIS — W3400XA Accidental discharge from unspecified firearms or gun, initial encounter: Secondary | ICD-10-CM | POA: Diagnosis not present

## 2015-04-11 DIAGNOSIS — Z79899 Other long term (current) drug therapy: Secondary | ICD-10-CM | POA: Insufficient documentation

## 2015-04-11 DIAGNOSIS — S199XXA Unspecified injury of neck, initial encounter: Secondary | ICD-10-CM | POA: Insufficient documentation

## 2015-04-11 DIAGNOSIS — T148XXA Other injury of unspecified body region, initial encounter: Secondary | ICD-10-CM

## 2015-04-11 DIAGNOSIS — Y9389 Activity, other specified: Secondary | ICD-10-CM | POA: Insufficient documentation

## 2015-04-11 MED ORDER — IBUPROFEN 800 MG PO TABS: 800.0000 mg | ORAL_TABLET | Freq: Three times a day (TID) | ORAL | Status: DC | PRN

## 2015-04-11 MED ORDER — CYCLOBENZAPRINE HCL 5 MG PO TABS: 5.0000 mg | ORAL_TABLET | Freq: Three times a day (TID) | ORAL | Status: DC | PRN

## 2015-04-11 MED ORDER — CYCLOBENZAPRINE HCL 10 MG PO TABS
5.0000 mg | ORAL_TABLET | Freq: Once | ORAL | Status: AC
  Administered 2015-04-11: 5 mg via ORAL
  Filled 2015-04-11: qty 1

## 2015-04-11 MED ORDER — IBUPROFEN 800 MG PO TABS
800.0000 mg | ORAL_TABLET | Freq: Once | ORAL | Status: AC
  Administered 2015-04-11: 800 mg via ORAL
  Filled 2015-04-11: qty 1
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
## 2015-04-11 NOTE — ED Provider Notes (Signed)
[facility] Emergency Department Provider Note  ____________________________________________  Time seen: Approximately 5:22 AM  I have reviewed the triage vital signs and the nursing notes.   HISTORY  Chief Complaint Head Injury and Headache    HPI Phillip Gill is a 39 y.o. male who presents to the ED from work [employer]) with a chief complaint of pain behind left ear and left neck pain. Patient states he was hit behind his left ear approximately one week ago while at work with a staple gun. Staples did not discharge and to patient's head. Patient denies LOC or dizziness. He did not seek medical treatment at that time. Recently he was seen at an outside facility for anxiety attack. Patient presents to the ED this evening stating "the pain has resurfaced and it hurts". He was at work when the pain behind his left ear began to hurt again. Yesterday morning patient notes awakening with painful left neck associated with spasms. Denies other recent trauma or injury. Denies recent travel. Denies fever, chills, chest pain, shortness of breath, nausea, vomiting, diarrhea, vision changes.    Past Medical History  Diagnosis Date  . Anxiety   . Depression   . HIV infection (HCC)     followed by Dr. Luciana Axe- sees him every 4 months  . Hypertension   . 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 IBS (irritable bowel syndrome)   . Renal insufficiency 12/29/2013  . Migraine     "none in years" (02/16/2015  . Arthritis     "neck" (02/16/2015)  . Fibromyalgia   . Chronic lower back pain   . HIV disease (HCC) 02/28/2015    Patient Active Problem List   Diagnosis Date Noted  . Colitis 03/08/2015  . Diarrhea   . HIV (human immunodeficiency virus infection) (HCC)   . Tobacco abuse   . HIV disease (HCC) 02/28/2015  . Leukocytosis   . LGV (lymphogranuloma venereum)   . Primary syphilis   . Chronic hepatitis B (HCC) 10/03/2014  . Inguinal adenopathy  10/03/2014  . Human immunodeficiency virus (HIV) disease (HCC)   . Essential hypertension   . Gynecomastia 04/30/2014  . 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 Tobacco dependence 06/03/2012  . Fibromyalgia muscle pain 02/11/2012  . Depression 12/07/2011  . Anxiety 02/02/2011    Past Surgical History  Procedure Laterality Date  . Wisdom tooth extraction    . High resolution anoscopy N/A 02/11/2013    Procedure: HIGH RESOLUTION ANOSCOPY WITH BIOPSY, LASER ABLATION;  Surgeon: Romie Levee, MD;  Location: [facility];  Service: General;  Laterality: N/A;  . Co2 laser application N/A 02/11/2013    Procedure: CO2 LASER APPLICATION;  Surgeon: Romie Levee, MD;  Location: [facility];  Service: General;  Laterality: N/A;    Current Outpatient Rx  Name  Route  Sig  Dispense  Refill  . ciprofloxacin (CIPRO) 500 MG tablet   Oral   Take 1 tablet (500 mg total) by mouth 2 (two) times daily. Patient not taking: Reported on 04/08/2015   10 tablet   0   . elvitegravir-cobicistat-emtricitabine-tenofovir (GENVOYA) 150-150-200-10 MG TABS tablet   Oral   Take 1 tablet by mouth daily with breakfast.   30 tablet   11   . imiquimod (ALDARA) 5 % cream      APPLY TO THE AFFECTED AREAS THREE TIMES PER WEEK Patient not taking: Reported on 04/08/2015   12 each   1   .  lisinopril (PRINIVIL,ZESTRIL) 20 MG tablet      TAKE 1 TABLET BY MOUTH EVERY DAY   30 tablet   6   . LORazepam (ATIVAN) 1 MG tablet   Oral   Take 1 tablet (1 mg total) by mouth 3 (three) times daily as needed for anxiety.   10 tablet   0   . metoprolol succinate (TOPROL-XL) 25 MG 24 hr tablet   Oral   Take 1 tablet (25 mg total) by mouth daily.   30 tablet   11   . metroNIDAZOLE (FLAGYL) 500 MG tablet   Oral   Take 1 tablet (500 mg total) by mouth 3 (three) times daily. Patient not taking: Reported on 04/08/2015   15 tablet   0   . nicotine (NICODERM CQ - DOSED IN MG/24 HOURS) 14 mg/24hr patch    Transdermal   Place 1 patch (14 mg total) onto the skin daily. Patient not taking: Reported on 03/08/2015   30 patch   1   . nicotine (NICODERM CQ - DOSED IN MG/24 HR) 7 mg/24hr patch   Transdermal   Place 1 patch (7 mg total) onto the skin daily. Patient not taking: Reported on 03/08/2015   7 patch   14     Allergies Bactrim; Cymbalta; Other; Zoloft; and Neurontin  Family History  Problem Relation Age of Onset  . Mental illness Neg Hx   . Hypertension Mother   . Diabetes Mother   . Stroke Mother     cerbral aneurysm  . Hypertension Brother     Social History Social History  Substance Use Topics  . Smoking status: Current Every Day Smoker -- 0.00 packs/day for 18 years    Types: Cigarettes  . Smokeless tobacco: Never Used  . Alcohol Use: Yes    Review of Systems Constitutional: No fever/chills Eyes: No visual changes. ENT: Positive for neck pain and spasms. Positive for pain behind left ear. No sore throat. Cardiovascular: Denies chest pain. Respiratory: Denies shortness of breath. Gastrointestinal: No abdominal pain.  No nausea, no vomiting.  No diarrhea.  No constipation. Genitourinary: Negative for dysuria. Musculoskeletal: Negative for back pain. Skin: Negative for rash. Neurological: Negative for headaches, focal weakness or numbness.  10-point ROS otherwise negative.  ____________________________________________   PHYSICAL EXAM:  VITAL SIGNS: ED Triage Vitals  Enc Vitals Group     BP 04/11/15 0419 140/95 mmHg     Pulse Rate 04/11/15 0419 63     Resp 04/11/15 0419 14     Temp 04/11/15 0419 98.2 F (36.8 C)     Temp Source 04/11/15 0419 Oral     SpO2 04/11/15 0419 100 %     Weight 04/11/15 0419 171 lb (77.565 kg)     Height 04/11/15 0419 6' (1.829 m)     Head Cir --      Peak Flow --      Pain Score 04/11/15 0420 10     Pain Loc --      Pain Edu? --      Excl. in GC? --     Constitutional: Alert and oriented. Well appearing and in no acute  distress. Eyes: Conjunctivae are normal. PERRL. EOMI. Head: Atraumatic. There is no hematoma, contusion or ecchymosis behind left ear. Mastoid process intact and nontender to palpation. Surface of skin behind left ear tender to palpation. Nose: No congestion/rhinnorhea. Mouth/Throat: Mucous membranes are moist.  Oropharynx non-erythematous. Neck: No stridor. No carotid bruits. No cervical spine tenderness to  palpation. Left paraspinal muscle spasms. FROM with some neck pain. Cardiovascular: Normal rate, regular rhythm. Grossly normal heart sounds.  Good peripheral circulation. Respiratory: Normal respiratory effort.  No retractions. Lungs CTAB. Gastrointestinal: Soft and nontender. No distention. No abdominal bruits. No CVA tenderness. Musculoskeletal: No lower extremity tenderness nor edema.  No joint effusions. Neurologic:  Normal speech and language. No gross focal neurologic deficits are appreciated. No gait instability. Skin:  Skin is warm, dry and intact. No rash noted. Psychiatric: Mood and affect are normal. Speech and behavior are normal.  ____________________________________________   LABS (all labs ordered are listed, but only abnormal results are displayed)  Labs Reviewed - No data to display ____________________________________________  EKG  None ____________________________________________  RADIOLOGY  None ____________________________________________   PROCEDURES  Procedure(s) performed: None  Critical Care performed: No  ____________________________________________   INITIAL IMPRESSION / ASSESSMENT AND PLAN / ED COURSE  Pertinent labs & imaging results that were available during my care of the patient were reviewed by me and considered in my medical decision making (see chart for details).  39 year old male who presents with pain behind his left ear 1 week after being struck with staple gun. Also with neck spasms. Will treat with NSAIDs, muscle relaxer  and follow-up with his PCP. Strict return precautions given. Patient verbalizes understanding and agrees with plan of care. ____________________________________________   FINAL CLINICAL IMPRESSION(S) / ED DIAGNOSES  Final diagnoses:  Torticollis  Nonintractable headache, unspecified chronicity pattern, unspecified headache type      Irean Hong, MD 04/11/15 [phone]

## 2015-04-11 NOTE — Discharge Instructions (Signed)
1. You may take medicines as needed for pain and muscle spasms (Motrin/Flexeril #15). 2. Apply moist heat to affected area several times daily. 3. Return to the ER for worsening symptoms, persistent vomiting, difficulty breathing or other concerns.  Acute Torticollis Torticollis is a condition in which the muscles of the neck tighten (contract) abnormally, causing the neck to twist and the head to move into an unnatural position. Torticollis that develops suddenly is called acute torticollis. If torticollis becomes chronic and is left untreated, the face and neck can become deformed. CAUSES This condition may be caused by:  Sleeping in an awkward position (common).  Extending or twisting the neck muscles beyond their normal position.  Infection. In some cases, the cause may not be known. SYMPTOMS Symptoms of this condition include:  An unnatural position of the head.  Neck pain.  A limited ability to move the neck.  Twisting of the neck to one side. DIAGNOSIS This condition is diagnosed with a physical exam. You may also have imaging tests, such as an X-ray, CT scan, or MRI. TREATMENT Treatment for this condition involves trying to relax the neck muscles. It may include:  Medicines or shots.  Physical therapy.  Surgery. This may be done in severe cases. HOME CARE INSTRUCTIONS  Take medicines only as directed by your health care provider.  Do stretching exercises and massage your neck as directed by your health care provider.  Keep all follow-up visits as directed by your health care provider. This is important. SEEK MEDICAL CARE IF:  You develop a fever. SEEK IMMEDIATE MEDICAL CARE IF:  You develop difficulty breathing.  You develop noisy breathing (stridor).  You start drooling.  You have trouble swallowing or have pain with swallowing.  You develop numbness or weakness in your hands or feet.  You have changes in your speech, understanding, or vision.  Your  pain gets worse.   This information is not intended to replace advice given to you by your health care provider. Make sure you discuss any questions you have with your health care provider.   Document Released: 06/15/2000 Document Revised: 11/02/2014 Document Reviewed: 06/14/2014 Elsevier Interactive Patient Education [employer]

## 2015-04-11 NOTE — ED Notes (Signed)
Patient with no complaints at this time. Respirations even and unlabored. Skin warm/dry. Discharge instructions reviewed with patient at this time. Patient given opportunity to voice concerns/ask questions. Patient discharged at this time and left Emergency Department with steady gait.   

## 2015-04-21 LAB — AFB CULTURE WITH SMEAR (NOT AT ARMC): ACID FAST SMEAR: NONE SEEN

## 2015-04-22 ENCOUNTER — Ambulatory Visit (INDEPENDENT_AMBULATORY_CARE_PROVIDER_SITE_OTHER): Payer: BLUE CROSS/BLUE SHIELD | Admitting: *Deleted

## 2015-04-22 DIAGNOSIS — Z111 Encounter for screening for respiratory tuberculosis: Secondary | ICD-10-CM | POA: Diagnosis not present

## 2015-04-22 NOTE — Progress Notes (Signed)
   PPD placed Left Forearm.  Pt to return 04/25/15 for reading.  Pt tolerated intradermal injection. Martin, Tamika L, RN          

## 2015-04-26 ENCOUNTER — Ambulatory Visit (INDEPENDENT_AMBULATORY_CARE_PROVIDER_SITE_OTHER): Payer: BLUE CROSS/BLUE SHIELD | Admitting: *Deleted

## 2015-04-26 DIAGNOSIS — Z111 Encounter for screening for respiratory tuberculosis: Secondary | ICD-10-CM

## 2015-04-26 LAB — TB SKIN TEST

## 2015-04-26 NOTE — Progress Notes (Signed)
   PPD placed right Forearm.  Pt to return 48-72  for reading.  Pt tolerated intradermal injection. Clovis PuMartin, Aubreana Cornacchia L, RN

## 2015-04-28 ENCOUNTER — Ambulatory Visit (INDEPENDENT_AMBULATORY_CARE_PROVIDER_SITE_OTHER): Payer: PRIVATE HEALTH INSURANCE | Admitting: *Deleted

## 2015-04-28 ENCOUNTER — Encounter: Payer: Self-pay | Admitting: *Deleted

## 2015-04-28 DIAGNOSIS — Z7689 Persons encountering health services in other specified circumstances: Secondary | ICD-10-CM

## 2015-04-28 DIAGNOSIS — Z111 Encounter for screening for respiratory tuberculosis: Secondary | ICD-10-CM

## 2015-04-28 LAB — TB SKIN TEST
Induration: 0 mm
TB Skin Test: NEGATIVE

## 2015-04-28 NOTE — Progress Notes (Signed)
   PPD Reading Note PPD read and results entered in EpicCare. Result: 0 mm induration. Interpretation: Negative If test not read within 48-72 hours of initial placement, patient advised to repeat in other arm 1-3 weeks after this test. Allergic reaction: no  Martin, Tamika L, RN  

## 2015-05-19 ENCOUNTER — Emergency Department (HOSPITAL_COMMUNITY): Payer: BLUE CROSS/BLUE SHIELD

## 2015-05-19 ENCOUNTER — Emergency Department (HOSPITAL_COMMUNITY)
Admission: EM | Admit: 2015-05-19 | Discharge: 2015-05-19 | Disposition: A | Discharge status: Home / Self Care | Payer: BLUE CROSS/BLUE SHIELD | Attending: Emergency Medicine | Admitting: Emergency Medicine

## 2015-05-19 ENCOUNTER — Encounter (HOSPITAL_COMMUNITY): Payer: Self-pay | Admitting: Emergency Medicine

## 2015-05-19 DIAGNOSIS — Z87448 Personal history of other diseases of urinary system: Secondary | ICD-10-CM | POA: Insufficient documentation

## 2015-05-19 DIAGNOSIS — F17210 Nicotine dependence, cigarettes, uncomplicated: Secondary | ICD-10-CM | POA: Diagnosis not present

## 2015-05-19 DIAGNOSIS — B20 Human immunodeficiency virus [HIV] disease: Secondary | ICD-10-CM | POA: Diagnosis not present

## 2015-05-19 DIAGNOSIS — Z8619 Personal history of other infectious and parasitic diseases: Secondary | ICD-10-CM | POA: Insufficient documentation

## 2015-05-19 DIAGNOSIS — I10 Essential (primary) hypertension: Secondary | ICD-10-CM | POA: Insufficient documentation

## 2015-05-19 DIAGNOSIS — G8929 Other chronic pain: Secondary | ICD-10-CM | POA: Insufficient documentation

## 2015-05-19 DIAGNOSIS — Z79899 Other long term (current) drug therapy: Secondary | ICD-10-CM | POA: Diagnosis not present

## 2015-05-19 DIAGNOSIS — R0602 Shortness of breath: Secondary | ICD-10-CM | POA: Insufficient documentation

## 2015-05-19 DIAGNOSIS — F410 Panic disorder [episodic paroxysmal anxiety] without agoraphobia: Secondary | ICD-10-CM | POA: Insufficient documentation

## 2015-05-19 DIAGNOSIS — Z8719 Personal history of other diseases of the digestive system: Secondary | ICD-10-CM | POA: Insufficient documentation

## 2015-05-19 DIAGNOSIS — R079 Chest pain, unspecified: Secondary | ICD-10-CM | POA: Insufficient documentation

## 2015-05-19 DIAGNOSIS — Z8739 Personal history of other diseases of the musculoskeletal system and connective tissue: Secondary | ICD-10-CM | POA: Insufficient documentation

## 2015-05-19 LAB — CBC
HEMATOCRIT: 44.1 % (ref 39.0–52.0)
Hemoglobin: 14.4 g/dL (ref 13.0–17.0)
MCH: 30.8 pg (ref 26.0–34.0)
MCHC: 32.7 g/dL (ref 30.0–36.0)
MCV: 94.4 fL (ref 78.0–100.0)
PLATELETS: 169 10*3/uL (ref 150–400)
RBC: 4.67 MIL/uL (ref 4.22–5.81)
RDW: 13.8 % (ref 11.5–15.5)
WBC: 10.4 10*3/uL (ref 4.0–10.5)

## 2015-05-19 LAB — I-STAT TROPONIN, ED
Troponin i, poc: 0 ng/mL (ref 0.00–0.08)
Troponin i, poc: 0 ng/mL (ref 0.00–0.08)

## 2015-05-19 LAB — BASIC METABOLIC PANEL
Anion gap: 8 (ref 5–15)
BUN: 8 mg/dL (ref 6–20)
CHLORIDE: 103 mmol/L (ref 101–111)
CO2: 27 mmol/L (ref 22–32)
CREATININE: 1.29 mg/dL — AB (ref 0.61–1.24)
Calcium: 8.8 mg/dL — ABNORMAL LOW (ref 8.9–10.3)
GFR calc Af Amer: >60 mL/min (ref >60)
GFR calc non Af Amer: >60 mL/min (ref >60)
GLUCOSE: 108 mg/dL — AB (ref 65–99)
POTASSIUM: 3.9 mmol/L (ref 3.5–5.1)
SODIUM: 138 mmol/L (ref 135–145)

## 2015-05-19 NOTE — ED Notes (Signed)
Discharge instructions reviewed - voiced understanding 

## 2015-05-19 NOTE — ED Notes (Signed)
Pt states he woke up having a panic attack just pta.  C/o sharp pain in center of chest and sob.  Denies nausea and vomiting.

## 2015-05-19 NOTE — Discharge Instructions (Signed)
Nonspecific Chest Pain [name], your blood work and EKG today was normal.  See a primary care doctor within 3 days for close follow up.  If any symptoms worsen, come back to the ED immediately.  Thank you. It is often hard to find the cause of chest pain. There is always a chance that your pain could be related to something serious, such as a heart attack or a blood clot in your lungs. Chest pain can also be caused by conditions that are not life-threatening. If you have chest pain, it is very important to follow up with your doctor.  HOME CARE  If you were prescribed an antibiotic medicine, finish it all even if you start to feel better.  Avoid any activities that cause chest pain.  Do not use any tobacco products, including cigarettes, chewing tobacco, or electronic cigarettes. If you need help quitting, ask your doctor.  Do not drink alcohol.  Take medicines only as told by your doctor.  Keep all follow-up visits as told by your doctor. This is important. This includes any further testing if your chest pain does not go away.  Your doctor may tell you to keep your head raised (elevated) while you sleep.  Make lifestyle changes as told by your doctor. These may include:  Getting regular exercise. Ask your doctor to suggest some activities that are safe for you.  Eating a heart-healthy diet. Your doctor or a diet specialist (dietitian) can help you to learn healthy eating options.  Maintaining a healthy weight.  Managing diabetes, if necessary.  Reducing stress. GET HELP IF:  Your chest pain does not go away, even after treatment.  You have a rash with blisters on your chest.  You have a fever. GET HELP RIGHT AWAY IF:  Your chest pain is worse.  You have an increasing cough, or you cough up blood.  You have severe belly (abdominal) pain.  You feel extremely weak.  You pass out (faint).  You have chills.  You have sudden, unexplained chest discomfort.  You  have sudden, unexplained discomfort in your arms, back, neck, or jaw.  You have shortness of breath at any time.  You suddenly start to sweat, or your skin gets clammy.  You feel nauseous.  You vomit.  You suddenly feel light-headed or dizzy.  Your heart begins to beat quickly, or it feels like it is skipping beats. These symptoms may be an emergency. Do not wait to see if the symptoms will go away. Get medical help right away. Call your local emergency services (911 in the [place] Do not drive yourself to the hospital.   This information is not intended to replace advice given to you by your health care provider. Make sure you discuss any questions you have with your health care provider.   Document Released: 12/05/2007 Document Revised: 07/09/2014 Document Reviewed: 01/22/2014 Elsevier Interactive Patient Education [employer] Else[employer] Inc[employer]

## 2015-05-19 NOTE — ED Provider Notes (Signed)
CSN: [id number]     Arrival date & time 05/19/15  0029 History  By signing my name below, I, Phillip Gill, attest that this documentation has been prepared under the direction and in the presence of Phillip Crumble, MD. Electronically Signed: Doreatha Gill, ED Scribe. 05/19/2015. 2:50 AM.    Chief Complaint  Patient presents with  . Panic Attack   The history is provided by the patient. No language interpreter was used.    HPI Comments: Phillip Gill is a 39 y.o. male with h/o anxiety, depression, HIV, HTN chronic Hep B who presents to the Emergency Department complaining of a panic attack that occurred just PTA that woke him from sleep. Pt states he was SOB and had sharp, non-radiating, central CP. He reports that his episode felt similar to his prior panic attacks. Pt states that stress, lack of sleep and caffeine triggers his panic attacks. He denies emesis, diaphoresis with this episode.    Past Medical History  Diagnosis Date  . Anxiety   . Depression   . HIV infection (HCC)     followed by Dr. Luciana Axe- sees him every 4 months  . Hypertension   . 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 IBS (irritable bowel syndrome)   . Renal insufficiency 12/29/2013  . Migraine     "none in years" (02/16/2015  . Arthritis     "neck" (02/16/2015)  . Fibromyalgia   . Chronic lower back pain   . HIV disease (HCC) 02/28/2015   Past Surgical History  Procedure Laterality Date  . Wisdom tooth extraction    . High resolution anoscopy N/A 02/11/2013    Procedure: HIGH RESOLUTION ANOSCOPY WITH BIOPSY, LASER ABLATION;  Surgeon: Romie Levee, MD;  Location: [facility];  Service: General;  Laterality: N/A;  . Co2 laser application N/A 02/11/2013    Procedure: CO2 LASER APPLICATION;  Surgeon: Romie Levee, MD;  Location: [facility];  Service: General;  Laterality: N/A;   Family History  Problem Relation Age of Onset  . Mental illness Neg Hx   . Hypertension Mother    . Diabetes Mother   . Stroke Mother     cerbral aneurysm  . Hypertension Brother    Social History  Substance Use Topics  . Smoking status: Current Every Day Smoker -- 0.00 packs/day for 18 years    Types: Cigarettes  . Smokeless tobacco: Never Used  . Alcohol Use: Yes    Review of Systems 10 Systems reviewed and are negative for acute change except as noted in the HPI.   Allergies  Bactrim; Cymbalta; Other; Zoloft; and Neurontin  Home Medications   Prior to Admission medications   Medication Sig Start Date End Date Taking? Authorizing Provider  elvitegravir-cobicistat-emtricitabine-tenofovir (GENVOYA) 150-150-200-10 MG TABS tablet Take 1 tablet by mouth daily with breakfast. 10/20/14  Yes Randall Hiss, MD  lisinopril (PRINIVIL,ZESTRIL) 20 MG tablet TAKE 1 TABLET BY MOUTH EVERY DAY 11/02/14  Yes Ginnie Smart, MD  metoprolol succinate (TOPROL-XL) 25 MG 24 hr tablet Take 1 tablet (25 mg total) by mouth daily. 08/30/14  Yes Randall Hiss, MD  ciprofloxacin (CIPRO) 500 MG tablet Take 1 tablet (500 mg total) by mouth 2 (two) times daily. Patient not taking: Reported on 04/08/2015 03/09/15   Bonney Aid, MD  cyclobenzaprine (FLEXERIL) 5 MG tablet Take 1 tablet (5 mg total) by mouth every 8 (eight) hours as needed for muscle spasms.  Patient not taking: Reported on 05/19/2015 04/11/15   Irean HongJade J Sung, MD  ibuprofen (ADVIL,MOTRIN) 800 MG tablet Take 1 tablet (800 mg total) by mouth every 8 (eight) hours as needed for moderate pain. Patient not taking: Reported on 05/19/2015 04/11/15   Irean HongJade J Sung, MD  imiquimod [facility](ALDARA) 5 % cream APPLY TO THE AFFECTED AREAS THREE TIMES PER WEEK Patient not taking: Reported on 04/08/2015 02/14/15   Gardiner Barefootobert W Comer, MD  LORazepam (ATIVAN) 1 MG tablet Take 1 tablet (1 mg total) by mouth 3 (three) times daily as needed for anxiety. Patient not taking: Reported on 05/19/2015 04/09/15   Derwood KaplanAnkit Nanavati, MD  metroNIDAZOLE (FLAGYL) 500 MG tablet Take 1  tablet (500 mg total) by mouth 3 (three) times daily. Patient not taking: Reported on 04/08/2015 03/09/15   Bonney AidAlyssa A Haney, MD  nicotine (NICODERM CQ - DOSED IN MG/24 HOURS) 14 mg/24hr patch Place 1 patch (14 mg total) onto the skin daily. Patient not taking: Reported on 03/08/2015 02/28/15   Randall Hissornelius N Van Dam, MD  nicotine (NICODERM CQ - DOSED IN MG/24 HR) 7 mg/24hr patch Place 1 patch (7 mg total) onto the skin daily. Patient not taking: Reported on 03/08/2015 02/28/15   Randall Hissornelius N Van Dam, MD   BP 137/92 mmHg  Pulse 79  Temp(Src) 98 F (36.7 C) (Oral)  Resp 26  Ht 5\' 10"  (1.778 m)  Wt 183 lb (83.008 kg)  BMI 26.26 kg/m2  SpO2 99% Physical Exam  Constitutional: He is oriented to person, place, and time. Vital signs are normal. He appears well-developed and well-nourished.  Non-toxic appearance. He does not appear ill. No distress.  HENT:  Head: Normocephalic and atraumatic.  Nose: Nose normal.  Mouth/Throat: Oropharynx is clear and moist. No oropharyngeal exudate.  Eyes: Conjunctivae and EOM are normal. Pupils are equal, round, and reactive to light. No scleral icterus.  Neck: Normal range of motion. Neck supple. No tracheal deviation, no edema, no erythema and normal range of motion present. No thyroid mass and no thyromegaly present.  Cardiovascular: Normal rate, regular rhythm, S1 normal, S2 normal, normal heart sounds, intact distal pulses and normal pulses.  Exam reveals no gallop and no friction rub.   No murmur heard. Pulmonary/Chest: Effort normal and breath sounds normal. No respiratory distress. He has no wheezes. He has no rhonchi. He has no rales.  Abdominal: Soft. Normal appearance and bowel sounds are normal. He exhibits no distension, no ascites and no mass. There is no hepatosplenomegaly. There is no tenderness. There is no rebound, no guarding and no CVA tenderness.  Musculoskeletal: Normal range of motion. He exhibits no edema or tenderness.  Lymphadenopathy:    He has no  cervical adenopathy.  Neurological: He is alert and oriented to person, place, and time. He has normal strength. No cranial nerve deficit or sensory deficit.  Skin: Skin is warm, dry and intact. No petechiae and no rash noted. He is not diaphoretic. No erythema. No pallor.  Psychiatric: He has a normal mood and affect. His behavior is normal. Judgment normal.  Nursing note and vitals reviewed.  ED Course  Procedures (including critical care time) DIAGNOSTIC STUDIES: Oxygen Saturation is 100% on RA, normal by my interpretation.    COORDINATION OF CARE: 2:50 AM Discussed treatment plan with pt at bedside and pt agreed to plan.   Labs Review Labs Reviewed  BASIC METABOLIC PANEL - Abnormal; Notable for the following:    Glucose, Bld 108 (*)    Creatinine, Ser  1.29 (*)    Calcium 8.8 (*)    All other components within normal limits  CBC  I-STAT TROPOININ, ED  Rosezena Sensor, ED    Imaging Review Dg Chest 2 View  05/19/2015  CLINICAL DATA:  Shortness of breath and chest pain. EXAM: CHEST  2 VIEW COMPARISON:  Radiograph 03/08/2015.  Chest CT 10/05/2014 FINDINGS: The cardiomediastinal contours are normal. The lungs are clear. Pulmonary vasculature is normal. No consolidation, pleural effusion, or pneumothorax. No acute osseous abnormalities are seen. IMPRESSION: No acute pulmonary process. Electronically Signed   By: Rubye Oaks M.D.   On: 05/19/2015 01:24   I have personally reviewed and evaluated these images and lab results as part of my medical decision-making.   EKG Interpretation   Date/Time:  Thursday May 19 2015 01:04:40 EST Ventricular Rate:  73 PR Interval:  196 QRS Duration: 98 QT Interval:  386 QTC Calculation: 425 R Axis:   74 Text Interpretation:  Normal sinus rhythm Septal infarct , age  undetermined Abnormal ECG No significant change since last tracing  Confirmed by POLLINA  MD, CHRISTOPHER [phone]) on 05/19/2015 1:11:22 AM      MDM   Final  diagnoses:  None    Patient presents to the ED for chest pain.  He believes this is an anxiety attack but he does has risk factors for ACS, including HTN, HIV. HEART score is 1.  Delta troponin and EKG are negative.  He appears well and in NAD, sleeping comfortably in the room after 3 hours of observation. Vital signs were within his normal limits and he is safe for discharge.   I personally performed the services described in this documentation, which was scribed in my presence. The recorded information has been reviewed and is accurate.     Phillip Crumble, MD 05/19/15 [phone]

## 2015-05-24 ENCOUNTER — Other Ambulatory Visit: Payer: Self-pay | Admitting: *Deleted

## 2015-05-24 DIAGNOSIS — A630 Anogenital (venereal) warts: Secondary | ICD-10-CM

## 2015-05-24 MED ORDER — IMIQUIMOD 5 % EX CREA: TOPICAL_CREAM | CUTANEOUS | Status: DC

## 2015-05-25 ENCOUNTER — Other Ambulatory Visit: Payer: Self-pay | Admitting: Infectious Diseases

## 2015-05-30 ENCOUNTER — Encounter: Payer: Self-pay | Admitting: Family Medicine

## 2015-07-06 ENCOUNTER — Other Ambulatory Visit: Payer: Self-pay

## 2015-07-06 DIAGNOSIS — B20 Human immunodeficiency virus [HIV] disease: Secondary | ICD-10-CM

## 2015-07-06 LAB — COMPLETE METABOLIC PANEL WITH GFR
ALBUMIN: 4.3 g/dL (ref 3.6–5.1)
ALK PHOS: 57 U/L (ref 40–115)
ALT: 9 U/L (ref 9–46)
AST: 19 U/L (ref 10–40)
BUN: 19 mg/dL (ref 7–25)
CALCIUM: 9.5 mg/dL (ref 8.6–10.3)
CO2: 29 mmol/L (ref 20–31)
CREATININE: 1.29 mg/dL (ref 0.60–1.35)
Chloride: 103 mmol/L (ref 98–110)
GFR, EST AFRICAN AMERICAN: 80 mL/min (ref >=60)
GFR, Est Non African American: 69 mL/min (ref >=60)
Glucose, Bld: 72 mg/dL (ref 65–99)
Potassium: 4 mmol/L (ref 3.5–5.3)
Sodium: 140 mmol/L (ref 135–146)
Total Bilirubin: 0.3 mg/dL (ref 0.2–1.2)
Total Protein: 7.6 g/dL (ref 6.1–8.1)

## 2015-07-06 LAB — CBC WITH DIFFERENTIAL/PLATELET
BASOS PCT: 0 % (ref 0–1)
Basophils Absolute: 0 10*3/uL (ref 0.0–0.1)
Eosinophils Absolute: 0.1 10*3/uL (ref 0.0–0.7)
Eosinophils Relative: 1 % (ref 0–5)
HEMATOCRIT: 43.4 % (ref 39.0–52.0)
Hemoglobin: 14.5 g/dL (ref 13.0–17.0)
LYMPHS ABS: 3.8 10*3/uL (ref 0.7–4.0)
LYMPHS PCT: 30 % (ref 12–46)
MCH: 30.5 pg (ref 26.0–34.0)
MCHC: 33.4 g/dL (ref 30.0–36.0)
MCV: 91.4 fL (ref 78.0–100.0)
MPV: 10.7 fL (ref 8.6–12.4)
Monocytes Absolute: 0.9 10*3/uL (ref 0.1–1.0)
Monocytes Relative: 7 % (ref 3–12)
NEUTROS ABS: 7.9 10*3/uL — AB (ref 1.7–7.7)
NEUTROS PCT: 62 % (ref 43–77)
Platelets: 163 10*3/uL (ref 150–400)
RBC: 4.75 MIL/uL (ref 4.22–5.81)
RDW: 14 % (ref 11.5–15.5)
WBC: 12.8 10*3/uL — ABNORMAL HIGH (ref 4.0–10.5)

## 2015-07-07 LAB — T-HELPER CELL (CD4) - (RCID CLINIC ONLY)
CD4 T CELL HELPER: 35 % (ref 33–55)
CD4 T Cell Abs: 1460 /uL (ref 400–2700)

## 2015-07-07 LAB — URINE CYTOLOGY ANCILLARY ONLY
Chlamydia: NEGATIVE
Neisseria Gonorrhea: NEGATIVE

## 2015-07-08 LAB — HIV-1 RNA QUANT-NO REFLEX-BLD
HIV 1 RNA Quant: <20 copies/mL (ref <20)
HIV-1 RNA Quant, Log: <1.3 Log copies/mL (ref <1.30)

## 2015-07-08 LAB — RPR

## 2015-07-20 ENCOUNTER — Ambulatory Visit (INDEPENDENT_AMBULATORY_CARE_PROVIDER_SITE_OTHER): Payer: Self-pay | Admitting: Infectious Disease

## 2015-07-20 ENCOUNTER — Encounter: Payer: Self-pay | Admitting: Infectious Disease

## 2015-07-20 VITALS — BP 161/82 | HR 70 | Temp 98.1°F | Wt 189.5 lb

## 2015-07-20 DIAGNOSIS — I10 Essential (primary) hypertension: Secondary | ICD-10-CM

## 2015-07-20 DIAGNOSIS — Z720 Tobacco use: Secondary | ICD-10-CM

## 2015-07-20 DIAGNOSIS — A510 Primary genital syphilis: Secondary | ICD-10-CM

## 2015-07-20 DIAGNOSIS — E663 Overweight: Secondary | ICD-10-CM

## 2015-07-20 DIAGNOSIS — F17200 Nicotine dependence, unspecified, uncomplicated: Secondary | ICD-10-CM

## 2015-07-20 DIAGNOSIS — B20 Human immunodeficiency virus [HIV] disease: Secondary | ICD-10-CM

## 2015-07-20 DIAGNOSIS — A55 Chlamydial lymphogranuloma (venereum): Secondary | ICD-10-CM

## 2015-07-20 DIAGNOSIS — F32A Depression, unspecified: Secondary | ICD-10-CM

## 2015-07-20 DIAGNOSIS — F329 Major depressive disorder, single episode, unspecified: Secondary | ICD-10-CM

## 2015-07-20 HISTORY — DX: Overweight: E66.3

## 2015-07-20 MED ORDER — LISINOPRIL 20 MG PO TABS: 20.0000 mg | ORAL_TABLET | Freq: Every day | ORAL | Status: DC

## 2015-07-20 MED ORDER — ELVITEG-COBIC-EMTRICIT-TENOFAF 150-150-200-10 MG PO TABS: 1.0000 | ORAL_TABLET | Freq: Every day | ORAL | Status: DC

## 2015-07-20 MED ORDER — METOPROLOL SUCCINATE ER 25 MG PO TB24: 25.0000 mg | ORAL_TABLET | Freq: Every day | ORAL | Status: DC

## 2015-07-20 NOTE — Patient Instructions (Signed)
I want you to make sure to renew your ADAP  Make sure you plug with [facility]  Make an appt with pharmacist --Ulyses Southward in next 2 months to re-evaluate your BP  Followup appt with me in 6 months  Try low carb diet such as Atkins or Paleo diet to lose weight which would help with BP

## 2015-07-20 NOTE — Progress Notes (Signed)
Subjective:    Chief complaint: followup for HIV on medications   Patient ID: Phillip Gill, male    DOB: 1976/01/06, 40 y.o.   MRN: [id number]  HPI   40 year old African American man who I have followed for some time now who was admitted to [facility] with septic syndrome and apparent LGV (Chlamydia was + from urine) and he had inguinal LA and painless ulcer and was quiet systemically ill. He also had syphilis with + RPR to 1:64 and one could argue about the ulcer as to whether due to syphilis or LGV. He received 2.4 mu of PCN IM for syphilis, and is on course to complete 21 days of doxycycline. He also had received rocephin and azithromycin despite not being diagnosed with GC.   He has since been seen by [place] in clinic and had repeat labs including NR RPR and negative GC and chlamydia from urine.   At that time he admitted to having unprotected insertive anal intercourse and oral sex but denied having receptive anal intercourse.    We retested him for [facility] and chlamydia from rectum and oropharynx and these were also negative. He states that when he is sexually active he is "only a top" rather than ever being a "bottom." He does engage in oral sex.  Past Medical History  Diagnosis Date  . Anxiety   . Depression   . HIV infection (HCC)     followed by Dr. Luciana Axe- sees him every 4 months  . Hypertension   . 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 IBS (irritable bowel syndrome)   . Renal insufficiency 12/29/2013  . Migraine     "none in years" (02/16/2015  . Arthritis     "neck" (02/16/2015)  . Fibromyalgia   . Chronic lower back pain   . HIV disease (HCC) 02/28/2015    Past Surgical History  Procedure Laterality Date  . Wisdom tooth extraction    . High resolution anoscopy N/A 02/11/2013    Procedure: HIGH RESOLUTION ANOSCOPY WITH BIOPSY, LASER ABLATION;  Surgeon: Romie Levee, MD;  Location: [facility];  Service: General;  Laterality: N/A;  . Co2 laser  application N/A 02/11/2013    Procedure: CO2 LASER APPLICATION;  Surgeon: Romie Levee, MD;  Location: [facility];  Service: General;  Laterality: N/A;    Family History  Problem Relation Age of Onset  . Mental illness Neg Hx   . Hypertension Mother   . Diabetes Mother   . Stroke Mother     cerbral aneurysm  . Hypertension Brother       Social History   Social History  . Marital Status: Single    Spouse Name: N/A  . Number of Children: N/A  . Years of Education: N/A   Social History Main Topics  . Smoking status: Current Every Day Smoker -- 0.50 packs/day for 18 years    Types: Cigarettes  . Smokeless tobacco: Never Used  . Alcohol Use: 0.0 oz/week    0 Standard drinks or equivalent per week  . Drug Use: No  . Sexual Activity:    Partners: Male   Other Topics Concern  . 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    Finished HS.   Doing online classes with [employer]- studying accounting   Previously 2 years of classes at [employer].    Has Partner- [name]- together for 1 year.    Has 2 girls- (born in 2000).           Allergies  Allergen Reactions  . Bactrim [Sulfamethoxazole W/Trimethoprim (Co-Trimoxazole)] Hives and Shortness Of Breath  . Cymbalta [Duloxetine Hcl] Diarrhea  . Other Hives and Swelling    [employer] toothpaste   . Zoloft [Sertraline Hcl] Diarrhea  . Neurontin [Gabapentin] Rash     Current outpatient prescriptions:  .  cyclobenzaprine (FLEXERIL) 5 MG tablet, Take 1 tablet (5 mg total) by mouth every 8 (eight) hours as needed for muscle spasms. (Patient not taking: Reported on 05/19/2015), Disp: 15 tablet, Rfl: 0 .  elvitegravir-cobicistat-emtricitabine-tenofovir (GENVOYA) 150-150-200-10 MG TABS tablet, Take 1 tablet by mouth daily with breakfast., Disp: 30 tablet, Rfl: 11 .  ibuprofen (ADVIL,MOTRIN) 800 MG tablet, Take 1 tablet (800  mg total) by mouth every 8 (eight) hours as needed for moderate pain. (Patient not taking: Reported on 05/19/2015), Disp: 15 tablet, Rfl: 0 .  imiquimod (ALDARA) 5 % cream, APPLY TO THE AFFECTED AREAS THREE TIMES PER WEEK, Disp: 12 each, Rfl: 1 .  lisinopril (PRINIVIL,ZESTRIL) 20 MG tablet, TAKE 1 TABLET BY MOUTH EVERY DAY, Disp: 30 tablet, Rfl: 3 .  LORazepam (ATIVAN) 1 MG tablet, Take 1 tablet (1 mg total) by mouth 3 (three) times daily as needed for anxiety. (Patient not taking: Reported on 05/19/2015), Disp: 10 tablet, Rfl: 0 .  metoprolol succinate (TOPROL-XL) 25 MG 24 hr tablet, Take 1 tablet (25 mg total) by mouth daily., Disp: 30 tablet, Rfl: 11 .  nicotine (NICODERM CQ - DOSED IN MG/24 HOURS) 14 mg/24hr patch, Place 1 patch (14 mg total) onto the skin daily. (Patient not taking: Reported on 03/08/2015), Disp: 30 patch, Rfl: 1 .  nicotine (NICODERM CQ - DOSED IN MG/24 HR) 7 mg/24hr patch, Place 1 patch (7 mg total) onto the skin daily. (Patient not taking: Reported on 03/08/2015), Disp: 7 patch, Rfl: 14 .  [DISCONTINUED] gabapentin (NEURONTIN) 300 MG capsule, Take 1 capsule (300 mg total) by mouth 3 (three) times daily., Disp: 90 capsule, Rfl: 3   Review of Systems  Constitutional: Negative for chills, diaphoresis, activity change, appetite change, fatigue and unexpected weight change.  HENT: Negative for congestion, rhinorrhea, sinus pressure, sneezing, sore throat and trouble swallowing.   Eyes: Negative for photophobia and visual disturbance.  Respiratory: Negative for cough, chest tightness, shortness of breath, wheezing and stridor.   Cardiovascular: Negative for chest pain, palpitations and leg swelling.  Gastrointestinal: Negative for nausea, vomiting, abdominal pain, diarrhea, constipation, blood in stool, abdominal distention and anal bleeding.  Genitourinary: Negative for dysuria, hematuria, flank pain and difficulty urinating.  Musculoskeletal: Negative for myalgias, back pain, joint  swelling, arthralgias and gait problem.  Skin: Negative for color change, pallor, rash and wound.  Neurological: Negative for dizziness, tremors, weakness and light-headedness.  Hematological: Negative for adenopathy. Does not bruise/bleed easily.  Psychiatric/Behavioral: Negative for behavioral problems, confusion, sleep disturbance, dysphoric mood, decreased concentration and agitation.       Objective:   Physical Exam  Constitutional: He is oriented to person, place, and time. He appears well-developed and well-nourished.  HENT:  Head: Normocephalic and atraumatic.  Mouth/Throat: Uvula is midline and oropharynx is clear and moist. No oropharyngeal exudate or posterior oropharyngeal edema.  Eyes: Conjunctivae and EOM are normal.  Neck: Normal range of motion. Neck supple.  Cardiovascular: Normal rate and regular rhythm.  Exam reveals no gallop.  No murmur heard. Pulmonary/Chest: Effort normal and breath sounds normal. No respiratory distress. He has no wheezes. He has no rales.  Abdominal: Soft. Bowel sounds are normal. He exhibits no distension. There is no tenderness.  Musculoskeletal: Normal range of motion. He exhibits no edema or tenderness.  Neurological: He is alert and oriented to person, place, and time.  Skin: Skin is warm and dry. No rash noted. No erythema. No pallor.  Psychiatric: He has a normal mood and affect. His behavior is normal. Judgment and thought content normal.          Assessment & Plan:   HIV: continue  Genvoya  LGV: sp treatment, GC and chlamydia negative from urine, will recheck OP and urine in future  Syphilis: follow titers NON REAC (01/04 0933)   HTN: not well controlled and is on beta blocker, ACEI. May need to add diuretic. BP worsening has occurred with 20# weight gain. I will have him followup with clinical pharmacist to consider adding another agent  Smoking: nicotine patch not being used. Need to address again  Depression: not on  meds but to meet with counselor at [facility] Overweight: recommended low carb diet and exercise  I spent greater than 25 minutes with the patient including greater than 50% of time in face to face counsel of the patient re his HIV, GC, chlamydia, syphilis, Depression HTN, obesityt and in coordination of his care.

## 2015-07-26 ENCOUNTER — Other Ambulatory Visit: Payer: Self-pay | Admitting: General Surgery

## 2015-07-26 NOTE — H&P (Signed)
History of Present Illness (Phillip Dishman MD; 07/26/2015 10:56 AM) The patient is a 40 year old male who presents with anal lesions. 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 He reports several new lesions. He is currently using Aldara that Dr. Daiva Gill prescribed him. He has been using this every other day for a year. He denies any bleeding. He is having regular bowel movements. He does have pain occasionally with BM's.   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 Lisinopril (  Tablet, Oral) Active. Imiquimod (5% Cream, External) Active. Genvoya (150-150-200-10MG  Tablet, Oral) Active. Medications Reconciled Chantix Continuing Month Pak (  Tablet, Oral) Active. Chantix Starting Month Pak (0.5 MG X 11 &1 MG X 42 Tablet, Oral) Active. Dicyclomine HCl (  Tablet, Oral) Active. Ondansetron (  Tablet Disperse, Oral) Active. Prezcobix (800-150MG  Tablet, Oral) Active. Truvada (200-300MG  Tablet, Oral) Active.  Social History Phillip Levee, MD; 07/26/2015 10:57 AM) Tobacco use Current every day smoker. Caffeine use Carbonated beverages. No drug use Alcohol use Occasional alcohol use.  Family History Phillip Levee, MD; 07/26/2015 10:57 AM) Diabetes Mellitus Mother. Hypertension Brother, Mother. Cerebrovascular Accident  Mother.     Review of Systems Phillip Levee MD; 07/26/2015 10:58 AM) General Not Present- Appetite Loss, Chills, Fatigue, Fever, Night Sweats, Weight Gain and Weight Loss. Skin Present- Dryness. Not Present- Change in Wart/Mole, Hives, Jaundice, New Lesions, Non-Healing Wounds, Rash and Ulcer. HEENT Present- Sinus Pain and Wears glasses/contact lenses. Not Present- Earache, Hearing Loss, Hoarseness, Nose Bleed, Oral Ulcers, Ringing in the Ears, Seasonal Allergies, Sore Throat, Visual Disturbances and Yellow Eyes. Respiratory Present- Snoring. Not Present- Bloody sputum, Chronic Cough, Difficulty Breathing and Wheezing. Cardiovascular Not Present- Chest Pain, Difficulty Breathing Lying Down, Leg Cramps, Palpitations, Rapid Heart Rate, Shortness of Breath and Swelling of Extremities. Gastrointestinal Present- Excessive gas. Not Present- Abdominal Pain, Bloating, Bloody Stool, Change in Bowel Habits, Chronic diarrhea, Constipation, Difficulty Swallowing, Gets full quickly at meals, Hemorrhoids, Indigestion, Nausea, Rectal Pain and Vomiting. Male Genitourinary Present- Frequency, Nocturia and Urgency. Not Present- Blood in Urine, Change in Urinary Stream, Impotence, Painful Urination and Urine Leakage. Musculoskeletal Not Present- Back Pain, Joint Pain, Joint Stiffness, Muscle Pain, Muscle Weakness and Swelling of Extremities. Neurological Not Present- Decreased Memory, Fainting, Headaches, Numbness, Seizures, Tingling, Tremor, Trouble walking and Weakness. Psychiatric Present- Depression. Not Present- Anxiety, Bipolar, Change in Sleep Pattern, Fearful and Frequent crying. Endocrine Not Present- Cold Intolerance, Excessive Hunger, Hair Changes, Heat Intolerance and New Diabetes. Hematology Present- HIV. Not Present- Easy Bruising, Excessive bleeding, Gland problems and Persistent Infections.  Vitals Phillip Gill CMA; 07/26/2015 10:40 AM) 07/26/2015 10:40 AM Weight: 186 lb Height: 72in Body  Surface Area: 2.07 m Body Mass Index: 25.23 kg/m Phillip [id number](Temporal)  Pulse: 74 (Regular)  BP: 130/76 (Sitting, Left Arm, Standard)      Physical Exam Phillip Levee MD; 07/26/2015 10:57 AM)  General Mental Status-Alert. General Appearance-Consistent with stated age. Hydration-Well hydrated. Voice-Normal.  Head and Neck Head-normocephalic, atraumatic with no lesions or palpable masses. Trachea-midline.  Eye Eyeball - Bilateral-Extraocular movements intact. Sclera/Conjunctiva - Bilateral-No  scleral icterus.  Cardiovascular Cardiovascular examination reveals -normal heart sounds, regular rate and rhythm with no murmurs and normal pedal pulses bilaterally.  Abdomen Inspection Inspection of the abdomen reveals - No Hernias. Skin - Scar - no surgical scars. Palpation/Percussion Palpation and Percussion of the abdomen reveal - Soft, Non Tender, No Rebound tenderness, No Rigidity (guarding) and No hepatosplenomegaly. Auscultation Auscultation of the abdomen reveals - Bowel sounds normal.  Rectal Anorectal Exam External - warts (internal opening). Internal - Note: several lesions noted at leavel of the dentate line, tender to palpation.  Neurologic Neurologic evaluation reveals -alert and oriented x 3 with no impairment of recent or remote memory. Mental Status-Normal.  Musculoskeletal Normal Exam - Left-Upper Extremity Strength Normal and Lower Extremity Strength Normal. Normal Exam - Right-Upper Extremity Strength Normal and Lower Extremity Strength Normal.    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 Pathology showed no AIN, just condyloma. He has been using Aldara cream for the last year and really has no external lesions. He does have multiple internal lesions that are palpated on digital exam. I  have recommended laser ablation. The patient would like to get this scheduled. We discussed the management of anal warts. We discussed chemical destruction, immunotherapy, and surgical excision. I discussed the pros and cons of each approach. We discussed the risk and benefits and the expected outcome with chemical destruction with agents such as podophyllin. I explained that podophyllin is generally not been effective and has a high recurrence rate. We discussed the use of Aldara ointment. I explained that it has a 30-70% chance at resolving or at least reducing the number of anal warts. I explained that it is applied 3 times a week at night and left on overnight. I explained that skin irritation is the most common side effect. We then discussed surgical excision specifically excision and fulguration. I explained how the surgery is performed. I explained that it can be painful however it generally has the highest success rate. We discussed the risk and benefits of surgery including but not limited to bleeding, infection, injury to surrounding structures, need to do a formal anoscopic exam to evaluate for anal canal warts, urinary retention, wart recurrence, and general anesthesia risk. We discussed the typical aftercare.

## 2015-08-30 ENCOUNTER — Emergency Department (HOSPITAL_COMMUNITY)
Admission: EM | Admit: 2015-08-30 | Discharge: 2015-08-30 | Disposition: A | Discharge status: Home / Self Care | Payer: MEDICAID | Attending: Emergency Medicine | Admitting: Emergency Medicine

## 2015-08-30 ENCOUNTER — Encounter (HOSPITAL_COMMUNITY): Payer: Self-pay | Admitting: Neurology

## 2015-08-30 ENCOUNTER — Emergency Department (HOSPITAL_COMMUNITY): Payer: MEDICAID

## 2015-08-30 DIAGNOSIS — Z8659 Personal history of other mental and behavioral disorders: Secondary | ICD-10-CM | POA: Insufficient documentation

## 2015-08-30 DIAGNOSIS — B20 Human immunodeficiency virus [HIV] disease: Secondary | ICD-10-CM | POA: Insufficient documentation

## 2015-08-30 DIAGNOSIS — Z79899 Other long term (current) drug therapy: Secondary | ICD-10-CM | POA: Insufficient documentation

## 2015-08-30 DIAGNOSIS — Z8739 Personal history of other diseases of the musculoskeletal system and connective tissue: Secondary | ICD-10-CM | POA: Insufficient documentation

## 2015-08-30 DIAGNOSIS — F17210 Nicotine dependence, cigarettes, uncomplicated: Secondary | ICD-10-CM | POA: Insufficient documentation

## 2015-08-30 DIAGNOSIS — Z87448 Personal history of other diseases of urinary system: Secondary | ICD-10-CM | POA: Insufficient documentation

## 2015-08-30 DIAGNOSIS — E663 Overweight: Secondary | ICD-10-CM | POA: Insufficient documentation

## 2015-08-30 DIAGNOSIS — J069 Acute upper respiratory infection, unspecified: Secondary | ICD-10-CM | POA: Insufficient documentation

## 2015-08-30 DIAGNOSIS — G8929 Other chronic pain: Secondary | ICD-10-CM | POA: Insufficient documentation

## 2015-08-30 DIAGNOSIS — I10 Essential (primary) hypertension: Secondary | ICD-10-CM | POA: Insufficient documentation

## 2015-08-30 DIAGNOSIS — R197 Diarrhea, unspecified: Secondary | ICD-10-CM | POA: Insufficient documentation

## 2015-08-30 DIAGNOSIS — Z8719 Personal history of other diseases of the digestive system: Secondary | ICD-10-CM | POA: Insufficient documentation

## 2015-08-30 DIAGNOSIS — R110 Nausea: Secondary | ICD-10-CM | POA: Insufficient documentation

## 2015-08-30 LAB — CBC
HCT: 38.6 % — ABNORMAL LOW (ref 39.0–52.0)
Hemoglobin: 13 g/dL (ref 13.0–17.0)
MCH: 31.1 pg (ref 26.0–34.0)
MCHC: 33.7 g/dL (ref 30.0–36.0)
MCV: 92.3 fL (ref 78.0–100.0)
PLATELETS: 148 10*3/uL — AB (ref 150–400)
RBC: 4.18 MIL/uL — ABNORMAL LOW (ref 4.22–5.81)
RDW: 13.2 % (ref 11.5–15.5)
WBC: 13.8 10*3/uL — AB (ref 4.0–10.5)

## 2015-08-30 LAB — URINALYSIS, ROUTINE W REFLEX MICROSCOPIC
Bilirubin Urine: NEGATIVE
GLUCOSE, UA: NEGATIVE mg/dL
Hgb urine dipstick: NEGATIVE
Ketones, ur: NEGATIVE mg/dL
LEUKOCYTES UA: NEGATIVE
NITRITE: NEGATIVE
PROTEIN: NEGATIVE mg/dL
Specific Gravity, Urine: 1.016 (ref 1.005–1.030)
pH: 6 (ref 5.0–8.0)

## 2015-08-30 LAB — COMPREHENSIVE METABOLIC PANEL
ALBUMIN: 3.6 g/dL (ref 3.5–5.0)
ALT: 12 U/L — ABNORMAL LOW (ref 17–63)
ANION GAP: 10 (ref 5–15)
AST: 25 U/L (ref 15–41)
Alkaline Phosphatase: 51 U/L (ref 38–126)
BUN: 20 mg/dL (ref 6–20)
CHLORIDE: 104 mmol/L (ref 101–111)
CO2: 26 mmol/L (ref 22–32)
Calcium: 8.9 mg/dL (ref 8.9–10.3)
Creatinine, Ser: 1.31 mg/dL — ABNORMAL HIGH (ref 0.61–1.24)
GFR calc Af Amer: >60 mL/min (ref >60)
Glucose, Bld: 92 mg/dL (ref 65–99)
POTASSIUM: 3.8 mmol/L (ref 3.5–5.1)
Sodium: 140 mmol/L (ref 135–145)
Total Bilirubin: 0.6 mg/dL (ref 0.3–1.2)
Total Protein: 6.7 g/dL (ref 6.5–8.1)

## 2015-08-30 LAB — RAPID STREP SCREEN (MED CTR MEBANE ONLY): STREPTOCOCCUS, GROUP A SCREEN (DIRECT): NEGATIVE

## 2015-08-30 LAB — LIPASE, BLOOD: LIPASE: 43 U/L (ref 11–51)

## 2015-08-30 MED ORDER — METOCLOPRAMIDE HCL 5 MG/ML IJ SOLN
10.0000 mg | Freq: Once | INTRAMUSCULAR | Status: AC
  Administered 2015-08-30: 10 mg via INTRAVENOUS
  Filled 2015-08-30: qty 2

## 2015-08-30 MED ORDER — DIPHENHYDRAMINE HCL 50 MG/ML IJ SOLN
25.0000 mg | Freq: Once | INTRAMUSCULAR | Status: AC
  Administered 2015-08-30: 25 mg via INTRAVENOUS
  Filled 2015-08-30: qty 1

## 2015-08-30 MED ORDER — KETOROLAC TROMETHAMINE 30 MG/ML IJ SOLN
30.0000 mg | Freq: Once | INTRAMUSCULAR | Status: AC
  Administered 2015-08-30: 30 mg via INTRAVENOUS
  Filled 2015-08-30: qty 1

## 2015-08-30 MED ORDER — ONDANSETRON HCL 4 MG PO TABS: 4.0000 mg | ORAL_TABLET | Freq: Four times a day (QID) | ORAL | Status: DC

## 2015-08-30 MED ORDER — SODIUM CHLORIDE 0.9 % IV BOLUS (SEPSIS)
1000.0000 mL | Freq: Once | INTRAVENOUS | Status: AC
  Administered 2015-08-30: 1000 mL via INTRAVENOUS

## 2015-08-30 MED ORDER — BENZONATATE 100 MG PO CAPS: 100.0000 mg | ORAL_CAPSULE | Freq: Three times a day (TID) | ORAL | Status: DC

## 2015-08-30 NOTE — ED Notes (Signed)
Patient transported to X-ray 

## 2015-08-30 NOTE — ED Notes (Signed)
ED PA at bedside

## 2015-08-30 NOTE — Discharge Instructions (Signed)
Phillip Gill,  Nice meeting you! Please follow-up with your primary care provider within a few days. Return to the emergency department if you develop fevers, chills, increased abdominal pain, or are unable to keep foods down. Feel better soon!  S. Lane Hacker, PA-C Upper Respiratory Infection, Adult Most upper respiratory infections (URIs) are a viral infection of the air passages leading to the lungs. A URI affects the nose, throat, and upper air passages. The most common type of URI is nasopharyngitis and is typically referred to as "the common cold." URIs run their course and usually go away on their own. Most of the time, a URI does not require medical attention, but sometimes a bacterial infection in the upper airways can follow a viral infection. This is called a secondary infection. Sinus and middle ear infections are common types of secondary upper respiratory infections. Bacterial pneumonia can also complicate a URI. A URI can worsen asthma and chronic obstructive pulmonary disease (COPD). Sometimes, these complications can require emergency medical care and may be life threatening.  CAUSES Almost all URIs are caused by viruses. A virus is a type of germ and can spread from one person to another.  RISKS FACTORS You may be at risk for a URI if:   You smoke.   You have chronic heart or lung disease.  You have a weakened defense (immune) system.   You are very young or very old.   You have nasal allergies or asthma.  You work in crowded or poorly ventilated areas.  You work in health care facilities or schools. SIGNS AND SYMPTOMS  Symptoms typically develop 2-3 days after you come in contact with a cold virus. Most viral URIs last 7-10 days. However, viral URIs from the influenza virus (flu virus) can last 14-18 days and are typically more severe. Symptoms may include:   Runny or stuffy (congested) nose.   Sneezing.   Cough.   Sore throat.   Headache.    Fatigue.   Fever.   Loss of appetite.   Pain in your forehead, behind your eyes, and over your cheekbones (sinus pain).  Muscle aches.  DIAGNOSIS  Your health care provider may diagnose a URI by:  Physical exam.  Tests to check that your symptoms are not due to another condition such as:  Strep throat.  Sinusitis.  Pneumonia.  Asthma. TREATMENT  A URI goes away on its own with time. It cannot be cured with medicines, but medicines may be prescribed or recommended to relieve symptoms. Medicines may help:  Reduce your fever.  Reduce your cough.  Relieve nasal congestion. HOME CARE INSTRUCTIONS   Take medicines only as directed by your health care provider.   Gargle warm saltwater or take cough drops to comfort your throat as directed by your health care provider.  Use a warm mist humidifier or inhale steam from a shower to increase air moisture. This may make it easier to breathe.  Drink enough fluid to keep your urine clear or pale yellow.   Eat soups and other clear broths and maintain good nutrition.   Rest as needed.   Return to work when your temperature has returned to normal or as your health care provider advises. You may need to stay home longer to avoid infecting others. You can also use a face mask and careful hand washing to prevent spread of the virus.  Increase the usage of your inhaler if you have asthma.   Do not use any  tobacco products, including cigarettes, chewing tobacco, or electronic cigarettes. If you need help quitting, ask your health care provider. PREVENTION  The best way to protect yourself from getting a cold is to practice good hygiene.   Avoid oral or hand contact with people with cold symptoms.   Wash your hands often if contact occurs.  There is no clear evidence that vitamin C, vitamin E, echinacea, or exercise reduces the chance of developing a cold. However, it is always recommended to get plenty of rest,  exercise, and practice good nutrition.  SEEK MEDICAL CARE IF:   You are getting worse rather than better.   Your symptoms are not controlled by medicine.   You have chills.  You have worsening shortness of breath.  You have brown or red mucus.  You have yellow or brown nasal discharge.  You have pain in your face, especially when you bend forward.  You have a fever.  You have swollen neck glands.  You have pain while swallowing.  You have white areas in the back of your throat. SEEK IMMEDIATE MEDICAL CARE IF:   You have severe or persistent:  Headache.  Ear pain.  Sinus pain.  Chest pain.  You have chronic lung disease and any of the following:  Wheezing.  Prolonged cough.  Coughing up blood.  A change in your usual mucus.  You have a stiff neck.  You have changes in your:  Vision.  Hearing.  Thinking.  Mood. MAKE SURE YOU:   Understand these instructions.  Will watch your condition.  Will get help right away if you are not doing well or get worse.   This information is not intended to replace advice given to you by your health care provider. Make sure you discuss any questions you have with your health care provider.   Document Released: 12/12/2000 Document Revised: 11/02/2014 Document Reviewed: 09/23/2013 Elsevier Interactive Patient Education [employer].

## 2015-08-30 NOTE — ED Provider Notes (Signed)
CSN: [id number]     Arrival date & [id number]  6578 History   First MD Initiated Contact with Patient 08/30/15 1004     Chief Complaint  Patient presents with  . Sore Throat  . Headache   HPI Phillip Gill is a 40 y.o. male PMH significant for HIV (CD4 count 1460 07/06/15), syphilis, hypertension, anxiety, depression, fibromyalgia presenting with a 1 day history of weakness, dizzy spells, sore throat, pressure between his eyes. He endorses "hot and cold flashes", ill contacts at work, nausea, body aches, diarrhea. He endorses an intermittent headache that is left parietal in location, nonradiating, lasting for a minute or two, 10 out of 10 pain scale. Denies fevers, chest pain, shortness of breath, emesis, urinary complaints.  Past Medical History  Diagnosis Date  . Anxiety   . Depression   . HIV infection (HCC)     followed by Dr. Luciana Axe- sees him every 4 months  . Hypertension   . 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 IBS (irritable bowel syndrome)   . Renal insufficiency 12/29/2013  . Migraine     "none in years" (02/16/2015  . Arthritis     "neck" (02/16/2015)  . Fibromyalgia   . Chronic lower back pain   . HIV disease (HCC) 02/28/2015  . Overweight 07/20/2015   Past Surgical History  Procedure Laterality Date  . Wisdom tooth extraction    . High resolution anoscopy N/A 02/11/2013    Procedure: HIGH RESOLUTION ANOSCOPY WITH BIOPSY, LASER ABLATION;  Surgeon: Romie Levee, MD;  Location: [facility];  Service: General;  Laterality: N/A;  . Co2 laser application N/A 02/11/2013    Procedure: CO2 LASER APPLICATION;  Surgeon: Romie Levee, MD;  Location: [facility];  Service: General;  Laterality: N/A;   Family History  Problem Relation Age of Onset  . Mental illness Neg Hx   . Hypertension Mother   . Diabetes Mother   . Stroke Mother     cerbral aneurysm  . Hypertension Brother    Social History  Substance Use Topics  . Smoking  status: Current Every Day Smoker -- 0.50 packs/day for 18 years    Types: Cigarettes  . Smokeless tobacco: Never Used  . Alcohol Use: 0.0 oz/week    0 Standard drinks or equivalent per week    Review of Systems  Ten systems are reviewed and are negative for acute change except as noted in the HPI  Allergies  Bactrim; Cymbalta; Other; Zoloft; and Neurontin  Home Medications   Prior to Admission medications   Medication Sig Start Date End Date Taking? Authorizing Provider  elvitegravir-cobicistat-emtricitabine-tenofovir (GENVOYA) 150-150-200-10 MG TABS tablet Take 1 tablet by mouth daily with breakfast. 07/20/15   Randall Hiss, MD  imiquimod [facility]) 5 % cream APPLY TO THE AFFECTED AREAS THREE TIMES PER WEEK 05/24/15   Gardiner Barefoot, MD  lisinopril (PRINIVIL,ZESTRIL) 20 MG tablet Take 1 tablet (20 mg total) by mouth daily. 07/20/15   Randall Hiss, MD  metoprolol succinate (TOPROL-XL) 25 MG 24 hr tablet Take 1 tablet (25 mg total) by mouth daily. 07/20/15   Randall Hiss, MD   BP 143/97 mmHg  Pulse 70  Temp(Src) 98 F (36.7 C) (Oral)  Resp 16  SpO2 98% Physical Exam  Constitutional: He appears well-developed and well-nourished. No distress.  Disheveled appearing  HENT:  Head: Normocephalic and atraumatic.  Mouth/Throat: Oropharynx is clear and  moist. No oropharyngeal exudate.  Eyes: Conjunctivae are normal. Pupils are equal, round, and reactive to light. Right eye exhibits no discharge. Left eye exhibits no discharge. No scleral icterus.  Neck: No tracheal deviation present.  Cardiovascular: Normal rate, regular rhythm, normal heart sounds and intact distal pulses.  Exam reveals no gallop and no friction rub.   No murmur heard. Pulmonary/Chest: Effort normal and breath sounds normal. No respiratory distress. He has no wheezes. He has no rales. He exhibits no tenderness.  Abdominal: Soft. Bowel sounds are normal. He exhibits no distension and no mass. There is no  tenderness. There is no rebound and no guarding.  Musculoskeletal: He exhibits no edema.  Lymphadenopathy:    He has no cervical adenopathy.  Neurological: He is alert. Coordination normal.  Skin: Skin is warm and dry. No rash noted. He is not diaphoretic. No erythema.  Psychiatric: He has a normal mood and affect. His behavior is normal.  Nursing note and vitals reviewed.   ED Course  Procedures  Labs Review Labs Reviewed  CBC - Abnormal; Notable for the following:    WBC 13.8 (*)    RBC 4.18 (*)    HCT 38.6 (*)    Platelets 148 (*)    All other components within normal limits  COMPREHENSIVE METABOLIC PANEL - Abnormal; Notable for the following:    Creatinine, Ser 1.31 (*)    ALT 12 (*)    All other components within normal limits  RAPID STREP SCREEN (NOT AT [facility])  CULTURE, GROUP A STREP ([facility])  LIPASE, BLOOD  URINALYSIS, ROUTINE W REFLEX MICROSCOPIC (NOT AT [facility])   Imaging Review Dg Chest 2 View  08/30/2015  CLINICAL DATA:  Productive cough, weakness, congestion and light sensitivity for several days. Initial encounter. EXAM: CHEST  2 VIEW COMPARISON:  PA and lateral chest 05/19/2015 and single view of the chest 03/22/2012. CT chest 10/05/2014. FINDINGS: The lungs are clear. Heart size is normal. There is no pneumothorax or pleural effusion. No focal bony abnormality. IMPRESSION: Negative chest. Electronically Signed   By: Drusilla Kanner M.D.   On: 08/30/2015 10:59   I have personally reviewed and evaluated these images and lab results as part of my medical decision-making.  MDM   Final diagnoses:  Upper respiratory infection   Patient non-toxic appearing and VSS.  Patients symptoms are consistent with URI, likely viral etiology.  CBC with nonspecific leukocytosis of 13.8. CMP, strep screen, lipase, UA, CXR unremarkable.  Medications  sodium chloride 0.9 % bolus 1,000 mL (0 mLs Intravenous Stopped 08/30/15 1227)  metoCLOPramide (REGLAN) injection 10 mg (10 mg  Intravenous Given 08/30/15 1100)  diphenhydrAMINE (BENADRYL) injection 25 mg (25 mg Intravenous Given 08/30/15 1100)  ketorolac (TORADOL) 30 MG/ML injection 30 mg (30 mg Intravenous Given 08/30/15 1100)  Patient feels improved after observation and/or treatment in ED.  Patient may be safely discharged home with  New Prescriptions   BENZONATATE (TESSALON) 100 MG CAPSULE    Take 1 capsule (100 mg total) by mouth every 8 (eight) hours.   ONDANSETRON (ZOFRAN) 4 MG TABLET    Take 1 tablet (4 mg total) by mouth every 6 (six) hours.  Discussed that antibiotics are not indicated for viral infections. Pt will be discharged with symptomatic treatment. Pt is hemodynamically stable & in NAD prior to dc. Discussed reasons for return. Patient to follow-up with primary care provider within one week. Patient in understanding and agreement with the plan.  Melton Krebs, PA-C 08/30/15 1234  Rolland Porter, MD 09/12/15 0005

## 2015-08-30 NOTE — ED Notes (Signed)
Pt reports this morning feeling weak, having dizzy spells, sore throat, pressure between his eyes. Pt is a x 4. Is ambulatory. Also having chills.

## 2015-09-01 LAB — CULTURE, GROUP A STREP (THRC)

## 2015-09-04 ENCOUNTER — Other Ambulatory Visit: Payer: Self-pay | Admitting: Internal Medicine

## 2015-09-11 ENCOUNTER — Other Ambulatory Visit: Payer: Self-pay | Admitting: Infectious Disease

## 2015-09-27 ENCOUNTER — Encounter (HOSPITAL_COMMUNITY): Payer: Self-pay | Admitting: Emergency Medicine

## 2015-09-27 DIAGNOSIS — E663 Overweight: Secondary | ICD-10-CM | POA: Insufficient documentation

## 2015-09-27 DIAGNOSIS — R42 Dizziness and giddiness: Secondary | ICD-10-CM | POA: Insufficient documentation

## 2015-09-27 DIAGNOSIS — G8929 Other chronic pain: Secondary | ICD-10-CM | POA: Insufficient documentation

## 2015-09-27 DIAGNOSIS — B20 Human immunodeficiency virus [HIV] disease: Secondary | ICD-10-CM | POA: Insufficient documentation

## 2015-09-27 DIAGNOSIS — Z79899 Other long term (current) drug therapy: Secondary | ICD-10-CM | POA: Insufficient documentation

## 2015-09-27 DIAGNOSIS — Z87448 Personal history of other diseases of urinary system: Secondary | ICD-10-CM | POA: Insufficient documentation

## 2015-09-27 DIAGNOSIS — I10 Essential (primary) hypertension: Secondary | ICD-10-CM | POA: Insufficient documentation

## 2015-09-27 DIAGNOSIS — F17210 Nicotine dependence, cigarettes, uncomplicated: Secondary | ICD-10-CM | POA: Insufficient documentation

## 2015-09-27 DIAGNOSIS — Z8739 Personal history of other diseases of the musculoskeletal system and connective tissue: Secondary | ICD-10-CM | POA: Insufficient documentation

## 2015-09-27 DIAGNOSIS — Z8659 Personal history of other mental and behavioral disorders: Secondary | ICD-10-CM | POA: Insufficient documentation

## 2015-09-27 DIAGNOSIS — B349 Viral infection, unspecified: Secondary | ICD-10-CM | POA: Insufficient documentation

## 2015-09-27 DIAGNOSIS — Z8719 Personal history of other diseases of the digestive system: Secondary | ICD-10-CM | POA: Insufficient documentation

## 2015-09-27 DIAGNOSIS — Z8619 Personal history of other infectious and parasitic diseases: Secondary | ICD-10-CM | POA: Insufficient documentation

## 2015-09-27 LAB — URINALYSIS, ROUTINE W REFLEX MICROSCOPIC
Bilirubin Urine: NEGATIVE
Glucose, UA: NEGATIVE mg/dL
Hgb urine dipstick: NEGATIVE
Ketones, ur: NEGATIVE mg/dL
LEUKOCYTES UA: NEGATIVE
NITRITE: NEGATIVE
PROTEIN: NEGATIVE mg/dL
SPECIFIC GRAVITY, URINE: 1.02 (ref 1.005–1.030)
pH: 5.5 (ref 5.0–8.0)

## 2015-09-27 LAB — CBC WITH DIFFERENTIAL/PLATELET
BASOS PCT: 0 %
Basophils Absolute: 0 10*3/uL (ref 0.0–0.1)
EOS ABS: 0.1 10*3/uL (ref 0.0–0.7)
Eosinophils Relative: 1 %
HCT: 43.2 % (ref 39.0–52.0)
HEMOGLOBIN: 14.3 g/dL (ref 13.0–17.0)
LYMPHS ABS: 3.9 10*3/uL (ref 0.7–4.0)
Lymphocytes Relative: 35 %
MCH: 31 pg (ref 26.0–34.0)
MCHC: 33.1 g/dL (ref 30.0–36.0)
MCV: 93.5 fL (ref 78.0–100.0)
MONO ABS: 0.5 10*3/uL (ref 0.1–1.0)
MONOS PCT: 5 %
Neutro Abs: 6.5 10*3/uL (ref 1.7–7.7)
Neutrophils Relative %: 59 %
Platelets: 170 10*3/uL (ref 150–400)
RBC: 4.62 MIL/uL (ref 4.22–5.81)
RDW: 13.1 % (ref 11.5–15.5)
WBC: 11 10*3/uL — ABNORMAL HIGH (ref 4.0–10.5)

## 2015-09-27 LAB — COMPREHENSIVE METABOLIC PANEL
ALBUMIN: 3.9 g/dL (ref 3.5–5.0)
ALK PHOS: 57 U/L (ref 38–126)
ALT: 11 U/L — AB (ref 17–63)
AST: 21 U/L (ref 15–41)
Anion gap: 7 (ref 5–15)
BUN: 10 mg/dL (ref 6–20)
CHLORIDE: 106 mmol/L (ref 101–111)
CO2: 28 mmol/L (ref 22–32)
Calcium: 9.4 mg/dL (ref 8.9–10.3)
Creatinine, Ser: 1.34 mg/dL — ABNORMAL HIGH (ref 0.61–1.24)
GFR calc Af Amer: >60 mL/min (ref >60)
GFR calc non Af Amer: >60 mL/min (ref >60)
Glucose, Bld: 98 mg/dL (ref 65–99)
Potassium: 3.9 mmol/L (ref 3.5–5.1)
SODIUM: 141 mmol/L (ref 135–145)
Total Bilirubin: 0.8 mg/dL (ref 0.3–1.2)
Total Protein: 7.6 g/dL (ref 6.5–8.1)

## 2015-09-27 NOTE — ED Notes (Addendum)
Pt. presents with multiple complaints : generalized weakness/fatigue with headache , dizziness and intermittent bilateral lower legs pain onset this week denies injury/ambulatory , denies fever or chills , respirations unlabored .

## 2015-09-28 ENCOUNTER — Emergency Department (HOSPITAL_COMMUNITY): Payer: Self-pay

## 2015-09-28 ENCOUNTER — Emergency Department (HOSPITAL_COMMUNITY)
Admission: EM | Admit: 2015-09-28 | Discharge: 2015-09-28 | Disposition: A | Discharge status: Home / Self Care | Payer: Self-pay | Attending: Emergency Medicine | Admitting: Emergency Medicine

## 2015-09-28 DIAGNOSIS — B349 Viral infection, unspecified: Secondary | ICD-10-CM

## 2015-09-28 HISTORY — DX: Anogenital (venereal) warts: A63.0

## 2015-09-28 MED ORDER — SODIUM CHLORIDE 0.9 % IV BOLUS (SEPSIS)
1000.0000 mL | Freq: Once | INTRAVENOUS | Status: AC
  Administered 2015-09-28: 1000 mL via INTRAVENOUS

## 2015-09-28 NOTE — ED Notes (Signed)
PA at bedside.

## 2015-09-28 NOTE — Discharge Instructions (Signed)
Viral Infections °A viral infection can be caused by different types of viruses. Most viral infections are not serious and resolve on their own. However, some infections may cause severe symptoms and may lead to further complications. °SYMPTOMS °Viruses can frequently cause: °· Minor sore throat. °· Aches and pains. °· Headaches. °· Runny nose. °· Different types of rashes. °· Watery eyes. °· Tiredness. °· Cough. °· Loss of appetite. °· Gastrointestinal infections, resulting in nausea, vomiting, and diarrhea. °These symptoms do not respond to antibiotics because the infection is not caused by bacteria. However, you might catch a bacterial infection following the viral infection. This is sometimes called a "superinfection." Symptoms of such a bacterial infection may include: °· Worsening sore throat with pus and difficulty swallowing. °· Swollen neck glands. °· Chills and a high or persistent fever. °· Severe headache. °· Tenderness over the sinuses. °· Persistent overall ill feeling (malaise), muscle aches, and tiredness (fatigue). °· Persistent cough. °· Yellow, green, or brown mucus production with coughing. °HOME CARE INSTRUCTIONS  °· Only take over-the-counter or prescription medicines for pain, discomfort, diarrhea, or fever as directed by your caregiver. °· Drink enough water and fluids to keep your urine clear or pale yellow. Sports drinks can provide valuable electrolytes, sugars, and hydration. °· Get plenty of rest and maintain proper nutrition. Soups and broths with crackers or rice are fine. °SEEK IMMEDIATE MEDICAL CARE IF:  °· You have severe headaches, shortness of breath, chest pain, neck pain, or an unusual rash. °· You have uncontrolled vomiting, diarrhea, or you are unable to keep down fluids. °· You or your child has an oral temperature above 102° F (38.9° C), not controlled by medicine. °· Your baby is older than 3 months with a rectal temperature of 102° F (38.9° C) or higher. °· Your baby is 3  months old or younger with a rectal temperature of 100.4° F (38° C) or higher. °MAKE SURE YOU:  °· Understand these instructions. °· Will watch your condition. °· Will get help right away if you are not doing well or get worse. °  °This information is not intended to replace advice given to you by your health care provider. Make sure you discuss any questions you have with your health care provider. °  °Document Released: 03/28/2005 Document Revised: 09/10/2011 Document Reviewed: 11/24/2014 °Elsevier Interactive Patient Education ©2016 [employer] ° °

## 2015-09-28 NOTE — ED Provider Notes (Signed)
CSN: [id number]     Arrival date & time 09/27/15  2149 History   First MD Initiated Contact with Patient 09/28/15 [phone]     Chief Complaint  Patient presents with  . Fatigue  . Dizziness  . Headache  . Leg Pain     (Consider location/radiation/quality/duration/timing/severity/associated sxs/prior Treatment) HPI Comments: Patient presents to the emergency department with chief complaint of body aches, fatigue, some dizziness, and headache. He also reports associated cough. Denies fever, nausea, vomiting, diarrhea. States that he feels dehydrated. States that he has been having the symptoms for the past several days. There are no modifying factors. Patient has been compliant with his HIV medications. Last CD4 count is 1460.  The history is provided by the patient. No language interpreter was used.    Past Medical History  Diagnosis Date  . Anxiety   . Depression   . HIV infection (HCC)     followed by Dr. Luciana Axe- sees him every 4 months  . Hypertension   . 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 IBS (irritable bowel syndrome)   . Renal insufficiency 12/29/2013  . Migraine     "none in years" (02/16/2015  . Arthritis     "neck" (02/16/2015)  . Fibromyalgia   . Chronic lower back pain   . HIV disease (HCC) 02/28/2015  . Overweight 07/20/2015  . Genital warts    Past Surgical History  Procedure Laterality Date  . Wisdom tooth extraction    . High resolution anoscopy N/A 02/11/2013    Procedure: HIGH RESOLUTION ANOSCOPY WITH BIOPSY, LASER ABLATION;  Surgeon: Romie Levee, MD;  Location: [facility];  Service: General;  Laterality: N/A;  . Co2 laser application N/A 02/11/2013    Procedure: CO2 LASER APPLICATION;  Surgeon: Romie Levee, MD;  Location: [facility];  Service: General;  Laterality: N/A;   Family History  Problem Relation Age of Onset  . Mental illness Neg Hx   . Hypertension Mother   . Diabetes Mother   . Stroke Mother    cerbral aneurysm  . Hypertension Brother    Social History  Substance Use Topics  . Smoking status: Current Every Day Smoker -- 0.00 packs/day for 18 years    Types: Cigarettes  . Smokeless tobacco: Never Used  . Alcohol Use: Yes    Review of Systems  Constitutional: Positive for chills. Negative for fever.  HENT: Negative for postnasal drip, sinus pressure, sneezing and sore throat.   Respiratory: Positive for cough. Negative for shortness of breath.   Cardiovascular: Negative for chest pain.  Gastrointestinal: Negative for nausea, vomiting, abdominal pain, diarrhea and constipation.  Genitourinary: Negative for dysuria.  All other systems reviewed and are negative.     Allergies  Bactrim; Cymbalta; Other; Zoloft; and Neurontin  Home Medications   Prior to Admission medications   Medication Sig Start Date End Date Taking? Authorizing Provider  elvitegravir-cobicistat-emtricitabine-tenofovir (GENVOYA) 150-150-200-10 MG TABS tablet Take 1 tablet by mouth daily with breakfast. 07/20/15  Yes Randall Hiss, MD  lisinopril (PRINIVIL,ZESTRIL) 20 MG tablet Take 1 tablet (20 mg total) by mouth daily. 07/20/15  Yes Randall Hiss, MD  metoprolol succinate (TOPROL-XL) 25 MG 24 hr tablet Take 1 tablet (25 mg total) by mouth daily. 07/20/15  Yes Randall Hiss, MD  benzonatate (TESSALON) 100 MG capsule Take 1 capsule (100 mg total) by mouth every 8 (eight) hours. Patient not taking: Reported on 09/28/2015  08/30/15   Melton KrebsSamantha Nicole Riley, PA-C  imiquimod (ALDARA) 5 % cream APPLY TO THE AFFECTED AREAS THREE TIMES PER WEEK Patient not taking: Reported on 09/28/2015 05/24/15   Gardiner Barefootobert W Comer, MD  ondansetron (ZOFRAN) 4 MG tablet Take 1 tablet (4 mg total) by mouth every 6 (six) hours. Patient not taking: Reported on 09/28/2015 08/30/15   Melton KrebsSamantha Nicole Riley, PA-C   BP 137/95 mmHg  Pulse 56  Temp(Src) 97.9 F (36.6 C) (Oral)  Resp 14  Ht 6' (1.829 m)  Wt 84.369 kg  BMI 25.22  kg/m2  SpO2 100% Physical Exam  Constitutional: He is oriented to person, place, and time. He appears well-developed and well-nourished.  HENT:  Head: Normocephalic and atraumatic.  Eyes: Conjunctivae and EOM are normal. Pupils are equal, round, and reactive to light. Right eye exhibits no discharge. Left eye exhibits no discharge. No scleral icterus.  Neck: Normal range of motion. Neck supple. No JVD present.  Cardiovascular: Normal rate, regular rhythm and normal heart sounds.  Exam reveals no gallop and no friction rub.   No murmur heard. Pulmonary/Chest: Effort normal and breath sounds normal. No respiratory distress. He has no wheezes. He has no rales. He exhibits no tenderness.  Abdominal: Soft. He exhibits no distension and no mass. There is no tenderness. There is no rebound and no guarding.  Musculoskeletal: Normal range of motion. He exhibits no edema or tenderness.  Neurological: He is alert and oriented to person, place, and time.  Skin: Skin is warm and dry.  Psychiatric: He has a normal mood and affect. His behavior is normal. Judgment and thought content normal.  Nursing note and vitals reviewed.   ED Course  Procedures (including critical care time) Labs Review Labs Reviewed  CBC WITH DIFFERENTIAL/PLATELET - Abnormal; Notable for the following:    WBC 11.0 (*)    All other components within normal limits  COMPREHENSIVE METABOLIC PANEL - Abnormal; Notable for the following:    Creatinine, Ser 1.34 (*)    ALT 11 (*)    All other components within normal limits  URINALYSIS, ROUTINE W REFLEX MICROSCOPIC (NOT AT [facility])    Imaging Review Dg Chest 2 View  09/28/2015  CLINICAL DATA:  Acute onset of cough and dizziness. Initial encounter. EXAM: CHEST  2 VIEW COMPARISON:  Chest radiograph performed 08/30/2015 FINDINGS: The lungs are well-aerated. Mild peribronchial thickening is noted. There is no evidence of focal opacification, pleural effusion or pneumothorax. The heart is  normal in size; the mediastinal contour is within normal limits. No acute osseous abnormalities are seen. IMPRESSION: Mild peribronchial thickening noted.  Lungs otherwise clear. Electronically Signed   By: Roanna RaiderJeffery  Chang M.D.   On: 09/28/2015 06:51   I have personally reviewed and evaluated these images and lab results as part of my medical decision-making.   EKG Interpretation None      MDM   Final diagnoses:  Viral syndrome    Patient with body aches, cough, some headache and dizziness. He is compliant with his HIV medications. He is neurovascularly intact. There is no evidence of infection on exam. Chest x-ray is clear. Labs are reassuring. Vital signs are stable. Recommend close follow-up with primary care doctor in 3 days. Patient understands and agrees the plan. He is stable and ready for discharge.    Roxy Horsemanobert Sheanna Dail, PA-C 09/28/15 [id number]  Rolland PorterMark James, MD 09/28/15 [phone]

## 2015-10-11 ENCOUNTER — Other Ambulatory Visit: Payer: Self-pay | Admitting: Infectious Diseases

## 2015-10-12 ENCOUNTER — Other Ambulatory Visit: Payer: Self-pay | Admitting: Infectious Diseases

## 2015-11-15 NOTE — Progress Notes (Signed)
This encounter was created in error - please disregard.

## 2015-12-16 ENCOUNTER — Emergency Department: Payer: Self-pay

## 2015-12-16 ENCOUNTER — Emergency Department
Admission: EM | Admit: 2015-12-16 | Discharge: 2015-12-16 | Disposition: A | Discharge status: Home / Self Care | Payer: Self-pay | Attending: Emergency Medicine | Admitting: Emergency Medicine

## 2015-12-16 DIAGNOSIS — Y939 Activity, unspecified: Secondary | ICD-10-CM | POA: Insufficient documentation

## 2015-12-16 DIAGNOSIS — F17210 Nicotine dependence, cigarettes, uncomplicated: Secondary | ICD-10-CM | POA: Insufficient documentation

## 2015-12-16 DIAGNOSIS — B20 Human immunodeficiency virus [HIV] disease: Secondary | ICD-10-CM | POA: Insufficient documentation

## 2015-12-16 DIAGNOSIS — Z79899 Other long term (current) drug therapy: Secondary | ICD-10-CM | POA: Insufficient documentation

## 2015-12-16 DIAGNOSIS — F329 Major depressive disorder, single episode, unspecified: Secondary | ICD-10-CM | POA: Insufficient documentation

## 2015-12-16 DIAGNOSIS — M4692 Unspecified inflammatory spondylopathy, cervical region: Secondary | ICD-10-CM | POA: Insufficient documentation

## 2015-12-16 DIAGNOSIS — I10 Essential (primary) hypertension: Secondary | ICD-10-CM | POA: Insufficient documentation

## 2015-12-16 DIAGNOSIS — S0083XA Contusion of other part of head, initial encounter: Secondary | ICD-10-CM | POA: Insufficient documentation

## 2015-12-16 DIAGNOSIS — Y999 Unspecified external cause status: Secondary | ICD-10-CM | POA: Insufficient documentation

## 2015-12-16 DIAGNOSIS — Y929 Unspecified place or not applicable: Secondary | ICD-10-CM | POA: Insufficient documentation

## 2015-12-16 MED ORDER — ACETAMINOPHEN 325 MG PO TABS
650.0000 mg | ORAL_TABLET | Freq: Once | ORAL | Status: AC
  Administered 2015-12-16: 650 mg via ORAL
  Filled 2015-12-16: qty 2

## 2015-12-16 MED ORDER — TRAMADOL HCL 50 MG PO TABS: 50.0000 mg | ORAL_TABLET | Freq: Four times a day (QID) | ORAL | Status: DC | PRN

## 2015-12-16 MED ORDER — IBUPROFEN 600 MG PO TABS
600.0000 mg | ORAL_TABLET | Freq: Three times a day (TID) | ORAL | Status: DC | PRN
Start: 2015-12-16 — End: 2017-06-18

## 2015-12-16 NOTE — Discharge Instructions (Signed)
Facial or Scalp Contusion ° A facial or scalp contusion is a deep bruise on the face or head. Contusions happen when an injury causes bleeding under the skin. Signs of bruising include pain, puffiness (swelling), and discolored skin. The contusion may turn blue, purple, or yellow. °HOME CARE °· Only take medicines as told by your doctor. °· Put ice on the injured area. °¨ Put ice in a plastic bag. °¨ Place a towel between your skin and the bag. °¨ Leave the ice on for 20 minutes, 2-3 times a day. °GET HELP IF: °· You have bite problems. °· You have pain when chewing. °· You are worried about your face not healing normally. °GET HELP RIGHT AWAY IF:  °· You have severe pain or a headache and medicine does not help. °· You are very tired or confused, or your personality changes. °· You throw up (vomit). °· You have a nosebleed that will not stop. °· You see two of everything (double vision) or have blurry vision. °· You have fluid coming from your nose or ear. °· You have problems walking or using your arms or legs. °MAKE SURE YOU:  °· Understand these instructions. °· Will watch your condition. °· Will get help right away if you are not doing well or get worse. °  °This information is not intended to replace advice given to you by your health care provider. Make sure you discuss any questions you have with your health care provider. °  °Document Released: 06/07/2011 Document Revised: 07/09/2014 Document Reviewed: 01/29/2013 °Elsevier Interactive Patient Education ©2016 [employer] ° °
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
## 2015-12-16 NOTE — ED Notes (Signed)
Pt verbalized understanding of discharge instructions. NAD at this time. 

## 2016-08-20 ENCOUNTER — Other Ambulatory Visit: Payer: Self-pay | Admitting: Infectious Disease

## 2016-08-28 ENCOUNTER — Other Ambulatory Visit: Payer: Self-pay | Admitting: Infectious Disease

## 2017-04-15 ENCOUNTER — Other Ambulatory Visit: Payer: Self-pay

## 2017-04-15 DIAGNOSIS — I10 Essential (primary) hypertension: Secondary | ICD-10-CM

## 2017-04-15 DIAGNOSIS — B20 Human immunodeficiency virus [HIV] disease: Secondary | ICD-10-CM

## 2017-04-16 ENCOUNTER — Encounter: Payer: Self-pay | Admitting: Infectious Disease

## 2017-04-16 LAB — CBC WITH DIFFERENTIAL/PLATELET
BASOS PCT: 0.3 %
Basophils Absolute: 33 cells/uL (ref 0–200)
Eosinophils Absolute: 87 cells/uL (ref 15–500)
Eosinophils Relative: 0.8 %
HEMATOCRIT: 37.9 % — AB (ref 38.5–50.0)
HEMOGLOBIN: 12.7 g/dL — AB (ref 13.2–17.1)
LYMPHS ABS: 4469 {cells}/uL — AB (ref 850–3900)
MCH: 30.1 pg (ref 27.0–33.0)
MCHC: 33.5 g/dL (ref 32.0–36.0)
MCV: 89.8 fL (ref 80.0–100.0)
MPV: 10.9 fL (ref 7.5–12.5)
Monocytes Relative: 6.8 %
NEUTROS PCT: 51.1 %
Neutro Abs: 5570 cells/uL (ref 1500–7800)
Platelets: 196 10*3/uL (ref 140–400)
RBC: 4.22 10*6/uL (ref 4.20–5.80)
RDW: 12.8 % (ref 11.0–15.0)
Total Lymphocyte: 41 %
WBC: 10.9 10*3/uL — ABNORMAL HIGH (ref 3.8–10.8)
WBCMIX: 741 {cells}/uL (ref 200–950)

## 2017-04-16 LAB — COMPLETE METABOLIC PANEL WITH GFR
AG RATIO: 1.4 (calc) (ref 1.0–2.5)
ALBUMIN MSPROF: 4.4 g/dL (ref 3.6–5.1)
ALT: 10 U/L (ref 9–46)
AST: 20 U/L (ref 10–40)
Alkaline phosphatase (APISO): 65 U/L (ref 40–115)
BUN: 13 mg/dL (ref 7–25)
CALCIUM: 9.3 mg/dL (ref 8.6–10.3)
CO2: 30 mmol/L (ref 20–32)
Chloride: 105 mmol/L (ref 98–110)
Creat: 1.13 mg/dL (ref 0.60–1.35)
GFR, EST AFRICAN AMERICAN: 94 mL/min/{1.73_m2} (ref >=60)
GFR, EST NON AFRICAN AMERICAN: 81 mL/min/{1.73_m2} (ref >=60)
GLOBULIN: 3.1 g/dL (ref 1.9–3.7)
Glucose, Bld: 79 mg/dL (ref 65–99)
POTASSIUM: 4.4 mmol/L (ref 3.5–5.3)
SODIUM: 139 mmol/L (ref 135–146)
TOTAL PROTEIN: 7.5 g/dL (ref 6.1–8.1)
Total Bilirubin: 0.4 mg/dL (ref 0.2–1.2)

## 2017-04-16 LAB — T-HELPER CELL (CD4) - (RCID CLINIC ONLY)
CD4 % Helper T Cell: 34 % (ref 33–55)
CD4 T Cell Abs: 1640 /uL (ref 400–2700)

## 2017-04-16 LAB — RPR: RPR: NONREACTIVE

## 2017-04-17 LAB — HIV-1 RNA QUANT-NO REFLEX-BLD
HIV 1 RNA Quant: 21 copies/mL — ABNORMAL HIGH
HIV-1 RNA QUANT, LOG: 1.32 {Log_copies}/mL — AB

## 2017-04-29 ENCOUNTER — Ambulatory Visit (INDEPENDENT_AMBULATORY_CARE_PROVIDER_SITE_OTHER): Payer: Self-pay | Admitting: Infectious Disease

## 2017-04-29 ENCOUNTER — Encounter: Payer: Self-pay | Admitting: Infectious Disease

## 2017-04-29 VITALS — BP 118/78 | HR 90 | Temp 98.9°F | Ht 72.0 in | Wt 195.0 lb

## 2017-04-29 DIAGNOSIS — B181 Chronic viral hepatitis B without delta-agent: Secondary | ICD-10-CM

## 2017-04-29 DIAGNOSIS — B20 Human immunodeficiency virus [HIV] disease: Secondary | ICD-10-CM

## 2017-04-29 DIAGNOSIS — A630 Anogenital (venereal) warts: Secondary | ICD-10-CM

## 2017-04-29 DIAGNOSIS — Z720 Tobacco use: Secondary | ICD-10-CM

## 2017-04-29 DIAGNOSIS — I10 Essential (primary) hypertension: Secondary | ICD-10-CM

## 2017-04-29 DIAGNOSIS — A55 Chlamydial lymphogranuloma (venereum): Secondary | ICD-10-CM

## 2017-04-29 DIAGNOSIS — Z23 Encounter for immunization: Secondary | ICD-10-CM

## 2017-04-29 NOTE — Progress Notes (Signed)
Subjective:    Chief complaint: followup for HIV on medications, having moved to [facility] and now back to [place]   Patient ID: Phillip Gill, male    DOB: 1976/03/13, 41 y.o.   MRN: [id number]  HPI  41 year old [place] American man who have followed for many years but who moved to [facility] and was going to [facility] for care. He has moved back to [place]. ADAP is renewed though he may have insurance soon. He is still on Genvoya and with nice virological control.  Lab Results  Component Value Date   HIV1RNAQUANT 21 (H) 04/15/2017   HIV1RNAQUANT <20 07/06/2015   HIV1RNAQUANT 25 (H) 02/14/2015   Lab Results  Component Value Date   CD4TABS 1,640 04/15/2017   CD4TABS 1,460 07/06/2015   CD4TABS 1,360 02/14/2015      Past Medical History:  Diagnosis Date  . Anxiety   . Arthritis    "neck" (02/16/2015)  . 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 Chronic lower back pain   . Depression   . Fibromyalgia   . Genital warts   . HIV disease (HCC) 02/28/2015  . HIV infection (HCC)    followed by Dr. Luciana Axe- sees him every 4 months  . Hypertension   . IBS (irritable bowel syndrome)   . Migraine    "none in years" (02/16/2015  . Overweight 07/20/2015  . Renal insufficiency 12/29/2013    Past Surgical History:  Procedure Laterality Date  . CO2 LASER APPLICATION N/A 02/11/2013   Procedure: CO2 LASER APPLICATION;  Surgeon: Romie Levee, MD;  Location: [facility];  Service: General;  Laterality: N/A;  . HIGH RESOLUTION ANOSCOPY N/A 02/11/2013   Procedure: HIGH RESOLUTION ANOSCOPY WITH BIOPSY, LASER ABLATION;  Surgeon: Romie Levee, MD;  Location: [facility];  Service: General;  Laterality: N/A;  . WISDOM TOOTH EXTRACTION      Family History  Problem Relation Age of Onset  . Mental illness Neg Hx   . Hypertension Mother   . Diabetes Mother   . Stroke Mother        cerbral aneurysm  . Hypertension Brother       Social History     Social History  . Marital status: Single    Spouse name: N/A  . Number of children: N/A  . Years of education: N/A   Social History Main Topics  . Smoking status: Current Every Day Smoker    Packs/day: 0.00    Years: 18.00    Types: Cigarettes  . Smokeless tobacco: Never Used  . Alcohol use Yes  . Drug use: No  . Sexual activity: Not Asked   Other Topics Concern  . 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    Finished HS.   Doing online classes with [employer]- studying accounting   Previously 2 years of classes at [employer].    Has Partner- [name]- together for 1 year.    Has 2 girls- (born in 2000).           Allergies  Allergen Reactions  . Bactrim [Sulfamethoxazole W/Trimethoprim (Co-Trimoxazole)] Hives and Shortness Of Breath  . Cymbalta [Duloxetine Hcl] Diarrhea  . Other Hives and Swelling    [employer] toothpaste   . Zoloft [Sertraline Hcl] Diarrhea  . Neurontin [Gabapentin] Rash     Current Outpatient Prescriptions:  .  elvitegravir-cobicistat-emtricitabine-tenofovir (GENVOYA) 150-150-200-10 MG TABS tablet, Take 1 tablet by mouth daily with breakfast., Disp: 30 tablet, Rfl: 11 .  imiquimod (ALDARA) 5 % cream, APPLY TO TO THE AFFECTED AREA THREE TIMES A WEEK, Disp: 12 each, Rfl: 0 .  metoprolol succinate (TOPROL-XL) 25 MG 24 hr tablet, Take 1 tablet (25 mg total) by mouth daily., Disp: 30 tablet, Rfl: 11 .  benzonatate (TESSALON) 100 MG capsule, Take 1 capsule (100 mg total) by mouth every 8 (eight) hours. (Patient not taking: Reported on 09/28/2015), Disp: 21 capsule, Rfl: 0 .  ibuprofen (ADVIL,MOTRIN) 600 MG tablet, Take 1 tablet (600 mg total) by mouth every 8 (eight) hours as needed. (Patient not taking: Reported on 04/29/2017), Disp: 15 tablet, Rfl: 0 .  lisinopril (PRINIVIL,ZESTRIL) 20 MG tablet, Take 1 tablet (20 mg total) by mouth daily. (Patient not taking:  Reported on 04/29/2017), Disp: 30 tablet, Rfl: 11 .  ondansetron (ZOFRAN) 4 MG tablet, Take 1 tablet (4 mg total) by mouth every 6 (six) hours. (Patient not taking: Reported on 09/28/2015), Disp: 12 tablet, Rfl: 0 .  traMADol (ULTRAM) 50 MG tablet, Take 1 tablet (50 mg total) by mouth every 6 (six) hours as needed for moderate pain. (Patient not taking: Reported on 04/29/2017), Disp: 12 tablet, Rfl: 0   Review of Systems  Constitutional: Negative for activity change, appetite change, chills, diaphoresis, fatigue and unexpected weight change.  HENT: Negative for congestion, rhinorrhea, sinus pressure, sneezing, sore throat and trouble swallowing.   Eyes: Negative for photophobia and visual disturbance.  Respiratory: Negative for cough, chest tightness, shortness of breath, wheezing and stridor.   Cardiovascular: Negative for chest pain, palpitations and leg swelling.  Gastrointestinal: Negative for abdominal distention, abdominal pain, anal bleeding, blood in stool, constipation, diarrhea, nausea and vomiting.  Genitourinary: Negative for difficulty urinating, dysuria, flank pain and hematuria.  Musculoskeletal: Negative for arthralgias, back pain, gait problem, joint swelling and myalgias.  Skin: Negative for color change, pallor, rash and wound.  Neurological: Negative for dizziness, tremors, weakness and light-headedness.  Hematological: Negative for adenopathy. Does not bruise/bleed easily.  Psychiatric/Behavioral: Negative for agitation, behavioral problems, confusion, decreased concentration, dysphoric mood and sleep disturbance.       Objective:   Physical Exam  Constitutional: He is oriented to person, place, and time. He appears well-developed and well-nourished.  HENT:  Head: Normocephalic and atraumatic.  Mouth/Throat: Uvula is midline and oropharynx is clear and moist. No oropharyngeal exudate or posterior oropharyngeal edema.  Eyes: Conjunctivae and EOM are normal.  Neck:  Normal range of motion. Neck supple.  Cardiovascular: Normal rate and regular rhythm.   Pulmonary/Chest: Effort normal and breath sounds normal. No respiratory distress. He has no wheezes. He exhibits no tenderness.  Abdominal: Soft. Bowel sounds are normal. He exhibits no distension. There is no tenderness.  Musculoskeletal: Normal range of motion. He exhibits no edema or tenderness.  Neurological: He is alert and oriented to person, place, and time.  Skin: Skin is warm and dry. No rash noted. No erythema. No pallor.  Psychiatric: He has a normal mood and affect. His behavior is normal. Judgment and thought content normal.  Nursing note and vitals reviewed.         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 Was seen by Dr.Thomas before but now not with insurance. Will schedule him with Dr. Ninetta LightsHatcher for starters, consider Dr> Barrruso's study as well.

## 2017-04-29 NOTE — Addendum Note (Signed)
Addended by: Towanda OctaveGREEN, Daianna Vasques on: 04/29/2017 11:16 AM   Modules accepted: Orders

## 2017-04-29 NOTE — Patient Instructions (Signed)
We need to also schedule you with Dr. Ninetta LightsHatcher for [facility] clinic

## 2017-06-17 ENCOUNTER — Ambulatory Visit (HOSPITAL_COMMUNITY)
Admission: EM | Admit: 2017-06-17 | Discharge: 2017-06-17 | Disposition: A | Discharge status: Home / Self Care | Payer: Self-pay | Attending: Urgent Care | Admitting: Urgent Care

## 2017-06-17 ENCOUNTER — Encounter (HOSPITAL_COMMUNITY): Payer: Self-pay | Admitting: Emergency Medicine

## 2017-06-17 ENCOUNTER — Emergency Department (HOSPITAL_COMMUNITY)
Admission: EM | Admit: 2017-06-17 | Discharge: 2017-06-17 | Disposition: A | Discharge status: Home / Self Care | Payer: Self-pay | Attending: Emergency Medicine | Admitting: Emergency Medicine

## 2017-06-17 DIAGNOSIS — R6889 Other general symptoms and signs: Secondary | ICD-10-CM

## 2017-06-17 DIAGNOSIS — I10 Essential (primary) hypertension: Secondary | ICD-10-CM | POA: Insufficient documentation

## 2017-06-17 DIAGNOSIS — F17210 Nicotine dependence, cigarettes, uncomplicated: Secondary | ICD-10-CM | POA: Insufficient documentation

## 2017-06-17 DIAGNOSIS — Z79899 Other long term (current) drug therapy: Secondary | ICD-10-CM | POA: Insufficient documentation

## 2017-06-17 DIAGNOSIS — B20 Human immunodeficiency virus [HIV] disease: Secondary | ICD-10-CM

## 2017-06-17 DIAGNOSIS — E860 Dehydration: Secondary | ICD-10-CM | POA: Insufficient documentation

## 2017-06-17 DIAGNOSIS — J029 Acute pharyngitis, unspecified: Secondary | ICD-10-CM | POA: Insufficient documentation

## 2017-06-17 DIAGNOSIS — Z5321 Procedure and treatment not carried out due to patient leaving prior to being seen by health care provider: Secondary | ICD-10-CM | POA: Insufficient documentation

## 2017-06-17 DIAGNOSIS — R05 Cough: Secondary | ICD-10-CM

## 2017-06-17 DIAGNOSIS — R42 Dizziness and giddiness: Secondary | ICD-10-CM | POA: Insufficient documentation

## 2017-06-17 DIAGNOSIS — R059 Cough, unspecified: Secondary | ICD-10-CM

## 2017-06-17 DIAGNOSIS — J069 Acute upper respiratory infection, unspecified: Secondary | ICD-10-CM | POA: Insufficient documentation

## 2017-06-17 LAB — RAPID STREP SCREEN (MED CTR MEBANE ONLY): Streptococcus, Group A Screen (Direct): NEGATIVE

## 2017-06-17 MED ORDER — BENZONATATE 100 MG PO CAPS: 100.0000 mg | ORAL_CAPSULE | Freq: Three times a day (TID) | ORAL | 0 refills | Status: DC | PRN

## 2017-06-17 NOTE — ED Triage Notes (Signed)
Dizziness and hot flashes and sore throat started today. Cough is dry with post nasal drip for couple days. denies any fevers. 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

## 2017-06-17 NOTE — Discharge Instructions (Signed)
You may take 500mg Tylenol with ibuprofen 400-600mg every 6 hours for pain and inflammation. For sore throat try using a honey-based tea. Use 3 teaspoons of honey with juice squeezed from half lemon. Place shaved pieces of ginger into 1/2-1 cup of water and warm over stove top. Then mix the ingredients and repeat every 4 hours as needed. °

## 2017-06-17 NOTE — ED Provider Notes (Signed)
  MRN: [id number] DOB: 1976/03/03  Subjective:   Phillip Gill is a 41 y.o. male presenting for 1 day history of sore throat, started having productive cough, body aches, sinus congestion. Cough elicits chest pain, dizziness. [facility] today and had rapid strep test done, was negative. Denies sinus pain, ear pain, ear drainage, n/v, abdominal pain. Smokes 1/2 ppd. Patient has HIV, takes [place] for this. Does not take anything currently for his seasonal allergies.   Phillip Gill is allergic to bactrim [sulfamethoxazole w/trimethoprim (co-trimoxazole)]; cymbalta [duloxetine hcl]; other; zoloft [sertraline hcl]; and neurontin [gabapentin].  Phillip Gill  has a past medical history of Anxiety, Arthritis, Chronic hepatitis B (HCC), Chronic lower back pain, Depression, Fibromyalgia, Genital warts, HIV disease (HCC) (02/28/2015), HIV infection (HCC), Hypertension, IBS (irritable bowel syndrome), Migraine, Overweight (07/20/2015), and Renal insufficiency (12/29/2013). Also  has a past surgical history that includes Wisdom tooth extraction; High resolution anoscopy (N/A, 02/11/2013); and Co2 laser application (N/A, 02/11/2013).  Objective:   Vitals: BP (!) 138/93 (BP Location: Right Arm)   Pulse 89   Temp 98.3 F (36.8 C) (Oral)   Resp 18   SpO2 99%   Physical Exam  Constitutional: He is oriented to person, place, and time. He appears well-developed and well-nourished.  HENT:  Mouth/Throat: Oropharynx is clear and moist.  Eyes: Right eye exhibits no discharge. Left eye exhibits no discharge.  Cardiovascular: Normal rate, regular rhythm and intact distal pulses. Exam reveals no gallop and no friction rub.  No murmur heard. Pulmonary/Chest: No respiratory distress. He has no wheezes. He has no rales.  Neurological: He is alert and oriented to person, place, and time.  Skin: Skin is warm and dry.   Assessment and Plan :   Flu-like symptoms  Cough  Sore throat  HIV disease  (HCC)   Likely viral in nature, advised supportive care. Return-to-clinic precautions discussed, patient verbalized understanding.    Wallis BambergMani, Aeva Posey, [place]PA-C 06/17/17 1949

## 2017-06-17 NOTE — ED Notes (Signed)
Called from urgent care stating is over there to be seen and needing to be taken out of our system so can be arrived there.

## 2017-06-17 NOTE — ED Triage Notes (Signed)
Pt sts URI sx and body aches today

## 2017-06-18 ENCOUNTER — Encounter (HOSPITAL_COMMUNITY): Payer: Self-pay | Admitting: Family Medicine

## 2017-06-18 ENCOUNTER — Emergency Department (HOSPITAL_COMMUNITY)
Admission: EM | Admit: 2017-06-18 | Discharge: 2017-06-18 | Disposition: A | Discharge status: Home / Self Care | Payer: Self-pay | Attending: Emergency Medicine | Admitting: Emergency Medicine

## 2017-06-18 ENCOUNTER — Emergency Department (HOSPITAL_COMMUNITY): Payer: Self-pay

## 2017-06-18 DIAGNOSIS — E860 Dehydration: Secondary | ICD-10-CM

## 2017-06-18 DIAGNOSIS — J069 Acute upper respiratory infection, unspecified: Secondary | ICD-10-CM

## 2017-06-18 DIAGNOSIS — R1110 Vomiting, unspecified: Secondary | ICD-10-CM

## 2017-06-18 DIAGNOSIS — R197 Diarrhea, unspecified: Secondary | ICD-10-CM

## 2017-06-18 LAB — COMPREHENSIVE METABOLIC PANEL
ALT: 10 U/L — ABNORMAL LOW (ref 17–63)
ANION GAP: 10 (ref 5–15)
AST: 20 U/L (ref 15–41)
Albumin: 4.1 g/dL (ref 3.5–5.0)
Alkaline Phosphatase: 70 U/L (ref 38–126)
BILIRUBIN TOTAL: 0.7 mg/dL (ref 0.3–1.2)
BUN: 14 mg/dL (ref 6–20)
CALCIUM: 8.5 mg/dL — AB (ref 8.9–10.3)
CO2: 24 mmol/L (ref 22–32)
Chloride: 102 mmol/L (ref 101–111)
Creatinine, Ser: 1.55 mg/dL — ABNORMAL HIGH (ref 0.61–1.24)
GFR, EST NON AFRICAN AMERICAN: 54 mL/min — AB (ref >60)
Glucose, Bld: 94 mg/dL (ref 65–99)
POTASSIUM: 3.4 mmol/L — AB (ref 3.5–5.1)
Sodium: 136 mmol/L (ref 135–145)
TOTAL PROTEIN: 7.6 g/dL (ref 6.5–8.1)

## 2017-06-18 LAB — CBC
HEMATOCRIT: 41.5 % (ref 39.0–52.0)
HEMOGLOBIN: 13.7 g/dL (ref 13.0–17.0)
MCH: 30.4 pg (ref 26.0–34.0)
MCHC: 33 g/dL (ref 30.0–36.0)
MCV: 92.2 fL (ref 78.0–100.0)
Platelets: 167 10*3/uL (ref 150–400)
RBC: 4.5 MIL/uL (ref 4.22–5.81)
RDW: 13.3 % (ref 11.5–15.5)
WBC: 9.9 10*3/uL (ref 4.0–10.5)

## 2017-06-18 LAB — LIPASE, BLOOD: Lipase: 37 U/L (ref 11–51)

## 2017-06-18 MED ORDER — SODIUM CHLORIDE 0.9 % IV BOLUS (SEPSIS)
1000.0000 mL | Freq: Once | INTRAVENOUS | Status: AC
  Administered 2017-06-18: 1000 mL via INTRAVENOUS

## 2017-06-18 MED ORDER — ONDANSETRON 8 MG PO TBDP: ORAL_TABLET | ORAL | 0 refills | Status: DC

## 2017-06-18 MED ORDER — ONDANSETRON HCL 4 MG/2ML IJ SOLN
4.0000 mg | Freq: Once | INTRAMUSCULAR | Status: AC
  Administered 2017-06-18: 4 mg via INTRAVENOUS
  Filled 2017-06-18: qty 2

## 2017-06-18 NOTE — ED Provider Notes (Signed)
[facility]-EMERGENCY DEPT Provider Note   CSN: [id number] Arrival date & time: 06/17/17  2244     History   Chief Complaint Chief Complaint  Patient presents with  . Emesis  . Diarrhea  . Hot Flashes    HPI Phillip Gill is a 41 y.o. male.  The history is provided by the patient.  Emesis   This is a new problem. The problem has been gradually worsening. The maximum temperature recorded prior to his arrival was 100 to 100.9 F. Associated symptoms include chills, cough, diarrhea, a fever and URI. Pertinent negatives include no abdominal pain and no headaches.  Diarrhea   This is a new problem. The problem has been gradually improving. Associated symptoms include vomiting, chills, URI and cough. Pertinent negatives include no abdominal pain and no headaches. He has tried nothing for the symptoms.   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 Anxiety   . Arthritis    "neck" (02/16/2015)  . 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 Chronic lower back pain   . Depression   . Fibromyalgia   . Genital warts   . HIV disease (HCC) 02/28/2015  . HIV infection (HCC)    followed by Dr. Luciana Axe- sees him every 4 months  . Hypertension   . IBS (irritable bowel syndrome)   . Migraine    "none in years" (02/16/2015  . Overweight 07/20/2015  . Renal insufficiency 12/29/2013    Patient Active Problem List   Diagnosis Date Noted  . Overweight 07/20/2015  .  Colitis 03/08/2015  . Diarrhea   . HIV (human immunodeficiency virus infection) (HCC)   . Tobacco abuse   . HIV disease (HCC) 02/28/2015  . Leukocytosis   . LGV (lymphogranuloma venereum)   . Primary syphilis   . Chronic hepatitis B (HCC) 10/03/2014  . Inguinal adenopathy 10/03/2014  . Human immunodeficiency virus (HIV) disease (HCC)   . Essential hypertension   . Gynecomastia 04/30/2014  . 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 Chronic pain associated with significant psychosocial dysfunction 09/18/2012  . HBV (hepatitis B virus) infection 09/18/2012  . Tobacco dependence 06/03/2012  . Fibromyalgia muscle pain 02/11/2012  . Depression 12/07/2011  . Anxiety 02/02/2011    Past Surgical History:  Procedure Laterality Date  . CO2 LASER APPLICATION N/A 02/11/2013   Procedure: CO2 LASER APPLICATION;  Surgeon: Romie Levee, MD;  Location: [facility];  Service: General;  Laterality: N/A;  . HIGH RESOLUTION ANOSCOPY N/A 02/11/2013   Procedure: HIGH RESOLUTION ANOSCOPY WITH BIOPSY, LASER ABLATION;  Surgeon: Romie Levee, MD;  Location: [facility];  Service: General;  Laterality: N/A;  . WISDOM TOOTH EXTRACTION         Home Medications    Prior to Admission medications   Medication Sig Start Date End Date Taking? Authorizing Provider  benzonatate (TESSALON) 100 MG capsule Take 1 capsule (100 mg total) by mouth 3 (three) times daily as needed for  cough. 06/17/17   Wallis BambergMani, Mario, PA-C  elvitegravir-cobicistat-emtricitabine-tenofovir [facility](GENVOYA) 150-150-200-10 MG TABS tablet Take 1 tablet by mouth daily with breakfast. 07/20/15   Daiva EvesVan Dam, Lisette Grinderornelius N, MD  ibuprofen (ADVIL,MOTRIN) 600 MG tablet Take 1 tablet (600 mg total) by mouth every 8 (eight) hours as needed. Patient not taking: Reported on 04/29/2017 12/16/15   Joni ReiningSmith, Ronald K, PA-C  imiquimod [facility](ALDARA) 5 % cream APPLY TO TO THE AFFECTED AREA THREE TIMES A WEEK 10/12/15   Daiva EvesVan Dam, Lisette Grinderornelius N, MD  lisinopril  (PRINIVIL,ZESTRIL) 20 MG tablet Take 1 tablet (20 mg total) by mouth daily. Patient not taking: Reported on 04/29/2017 07/20/15   Daiva EvesVan Dam, Lisette Grinderornelius N, MD  metoprolol succinate (TOPROL-XL) 25 MG 24 hr tablet Take 1 tablet (25 mg total) by mouth daily. 07/20/15   Randall HissVan Dam, Cornelius N, MD  ondansetron (ZOFRAN) 4 MG tablet Take 1 tablet (4 mg total) by mouth every 6 (six) hours. Patient not taking: Reported on 09/28/2015 08/30/15   Melton Krebsiley, Samantha Nicole, PA-C  traMADol (ULTRAM) 50 MG tablet Take 1 tablet (50 mg total) by mouth every 6 (six) hours as needed for moderate pain. Patient not taking: Reported on 04/29/2017 12/16/15   Joni ReiningSmith, Ronald K, PA-C    Family History Family History  Problem Relation Age of Onset  . Hypertension Mother   . Diabetes Mother   . Stroke Mother        cerbral aneurysm  . Hypertension Brother   . Mental illness Neg Hx     Social History Social History   Tobacco Use  . Smoking status: Current Every Day Smoker    Packs/day: 0.25    Years: 18.00    Pack years: 4.50    Types: Cigarettes  . Smokeless tobacco: Never Used  Substance Use Topics  . Alcohol use: Yes    Comment: "Once in a blue moon"  . Drug use: No     Allergies   Bactrim [sulfamethoxazole w/trimethoprim (co-trimoxazole)]; Cymbalta [duloxetine hcl]; Other; Zoloft [sertraline hcl]; and Neurontin [gabapentin]   Review of Systems Review of Systems  Constitutional: Positive for chills, fatigue and fever.  Respiratory: Positive for cough.   Gastrointestinal: Positive for diarrhea and vomiting. Negative for abdominal pain and blood in stool.  Neurological: Negative for headaches.  All other systems reviewed and are negative.    Physical Exam Updated Vital Signs BP (!) 144/99 (BP Location: Left Arm)   Pulse 95   Temp 98.1 F (36.7 C) (Oral)   Resp 18   Ht 1.829 m (6')   Wt 87.1 kg (192 lb)   SpO2 99%   BMI 26.04 kg/m   Physical Exam CONSTITUTIONAL: Well developed/well  nourished HEAD: Normocephalic/atraumatic EYES: EOMI/PERRL ENMT: Mucous membranes moist NECK: supple no meningeal signs SPINE/BACK:entire spine nontender CV: S1/S2 noted, no murmurs/rubs/gallops noted LUNGS: Lungs are clear to auscultation bilaterally, no apparent distress ABDOMEN: soft, nontender, no rebound or guarding, bowel sounds noted throughout abdomen GU:no cva tenderness NEURO: Pt is awake/alert/appropriate, moves all extremitiesx4.  No facial droop.   EXTREMITIES: pulses normal/equal, full ROM SKIN: warm, color normal PSYCH: no abnormalities of mood noted, alert and oriented to situation   ED Treatments / Results  Labs (all labs ordered are listed, but only abnormal results are displayed) Labs Reviewed  COMPREHENSIVE METABOLIC PANEL - Abnormal; Notable for the following components:      Result Value   Potassium 3.4 (*)    Creatinine, Ser 1.55 (*)    Calcium 8.5 (*)  ALT 10 (*)    GFR calc non Af Amer 54 (*)    All other components within normal limits  LIPASE, BLOOD  CBC    EKG  EKG Interpretation None       Radiology Dg Chest 2 View  Result Date: 06/18/2017 CLINICAL DATA:  Cough and fever for 2 days. EXAM: CHEST  2 VIEW COMPARISON:  Radiographs 09/28/2015 FINDINGS: The cardiomediastinal contours are normal. The lungs are clear. Pulmonary vasculature is normal. No consolidation, pleural effusion, or pneumothorax. No acute osseous abnormalities are seen. IMPRESSION: No acute pulmonary process. Electronically Signed   By: Rubye OaksMelanie  Ehinger M.D.   On: 06/18/2017 04:56    Procedures Procedures (including critical care time)  Medications Ordered in ED Medications  sodium chloride 0.9 % bolus 1,000 mL (1,000 mLs Intravenous New Bag/Given 06/18/17 0448)  ondansetron (ZOFRAN) injection 4 mg (4 mg Intravenous Given 06/18/17 0448)     Initial Impression / Assessment and Plan / ED Course  I have reviewed the triage vital signs and the nursing  notes.  Pertinent labs & imaging results that were available during my care of the patient were reviewed by me and considered in my medical decision making (see chart for details).     Patient improved, taking p.o. fluids and feels better after IV fluids and Zofran. He is awake and alert, no distress, not septic appearing Strongly suspect this is a viral illness We will discharge home, we discussed strict return precautions, patient agrees with plan Final Clinical Impressions(s) / ED Diagnoses   Final diagnoses:  Dehydration  Vomiting and diarrhea  Viral upper respiratory tract infection    ED Discharge Orders        Ordered    ondansetron (ZOFRAN ODT) 8 MG disintegrating tablet     06/18/17 0555       Zadie RhineWickline, Dayle Sherpa, MD 06/18/17 [id number]

## 2017-06-18 NOTE — ED Triage Notes (Signed)
Patient reports he is experiencing nausea, vomiting, diarrhea, and hot flashes. Patient reports his oral temperature 100.4. Patient reports taking IBUPROFEN 800mg  at 20:30. Symptoms started yesterday. Denies any abd pain.

## 2017-06-20 LAB — CULTURE, GROUP A STREP (THRC)

## 2017-06-21 ENCOUNTER — Emergency Department (HOSPITAL_COMMUNITY): Payer: Self-pay

## 2017-06-21 ENCOUNTER — Inpatient Hospital Stay (HOSPITAL_COMMUNITY)
Admission: EM | Admit: 2017-06-21 | Discharge: 2017-06-24 | DRG: 153 | Disposition: A | Discharge status: Home / Self Care | Payer: Self-pay | Attending: Internal Medicine | Admitting: Internal Medicine

## 2017-06-21 ENCOUNTER — Other Ambulatory Visit: Payer: Self-pay

## 2017-06-21 ENCOUNTER — Encounter (HOSPITAL_COMMUNITY): Payer: Self-pay | Admitting: Emergency Medicine

## 2017-06-21 DIAGNOSIS — A419 Sepsis, unspecified organism: Secondary | ICD-10-CM | POA: Diagnosis present

## 2017-06-21 DIAGNOSIS — I10 Essential (primary) hypertension: Secondary | ICD-10-CM | POA: Diagnosis present

## 2017-06-21 DIAGNOSIS — R6889 Other general symptoms and signs: Secondary | ICD-10-CM | POA: Insufficient documentation

## 2017-06-21 DIAGNOSIS — F17210 Nicotine dependence, cigarettes, uncomplicated: Secondary | ICD-10-CM | POA: Diagnosis present

## 2017-06-21 DIAGNOSIS — J111 Influenza due to unidentified influenza virus with other respiratory manifestations: Principal | ICD-10-CM | POA: Diagnosis present

## 2017-06-21 DIAGNOSIS — E876 Hypokalemia: Secondary | ICD-10-CM | POA: Diagnosis present

## 2017-06-21 DIAGNOSIS — D72829 Elevated white blood cell count, unspecified: Secondary | ICD-10-CM | POA: Diagnosis present

## 2017-06-21 DIAGNOSIS — Z8249 Family history of ischemic heart disease and other diseases of the circulatory system: Secondary | ICD-10-CM

## 2017-06-21 DIAGNOSIS — Z833 Family history of diabetes mellitus: Secondary | ICD-10-CM

## 2017-06-21 DIAGNOSIS — Z823 Family history of stroke: Secondary | ICD-10-CM

## 2017-06-21 DIAGNOSIS — B20 Human immunodeficiency virus [HIV] disease: Secondary | ICD-10-CM | POA: Diagnosis present

## 2017-06-21 LAB — CBC WITH DIFFERENTIAL/PLATELET
BASOS PCT: 0 %
Basophils Absolute: 0 10*3/uL (ref 0.0–0.1)
EOS ABS: 0 10*3/uL (ref 0.0–0.7)
EOS PCT: 0 %
HCT: 40.5 % (ref 39.0–52.0)
HEMOGLOBIN: 13.7 g/dL (ref 13.0–17.0)
LYMPHS ABS: 3.2 10*3/uL (ref 0.7–4.0)
Lymphocytes Relative: 23 %
MCH: 30.9 pg (ref 26.0–34.0)
MCHC: 33.8 g/dL (ref 30.0–36.0)
MCV: 91.2 fL (ref 78.0–100.0)
MONO ABS: 1.3 10*3/uL — AB (ref 0.1–1.0)
MONOS PCT: 9 %
Neutro Abs: 9.6 10*3/uL — ABNORMAL HIGH (ref 1.7–7.7)
Neutrophils Relative %: 68 %
PLATELETS: 132 10*3/uL — AB (ref 150–400)
RBC: 4.44 MIL/uL (ref 4.22–5.81)
RDW: 13 % (ref 11.5–15.5)
WBC: 14.1 10*3/uL — ABNORMAL HIGH (ref 4.0–10.5)

## 2017-06-21 LAB — URINALYSIS, ROUTINE W REFLEX MICROSCOPIC
Bilirubin Urine: NEGATIVE
Glucose, UA: NEGATIVE mg/dL
Hgb urine dipstick: NEGATIVE
KETONES UR: 5 mg/dL — AB
Leukocytes, UA: NEGATIVE
Nitrite: NEGATIVE
PH: 5 (ref 5.0–8.0)
Protein, ur: 100 mg/dL — AB
Specific Gravity, Urine: 1.029 (ref 1.005–1.030)

## 2017-06-21 LAB — COMPREHENSIVE METABOLIC PANEL
ALK PHOS: 55 U/L (ref 38–126)
ALT: 11 U/L — AB (ref 17–63)
AST: 25 U/L (ref 15–41)
Albumin: 3.9 g/dL (ref 3.5–5.0)
Anion gap: 9 (ref 5–15)
BUN: 10 mg/dL (ref 6–20)
CALCIUM: 8.5 mg/dL — AB (ref 8.9–10.3)
CHLORIDE: 102 mmol/L (ref 101–111)
CO2: 25 mmol/L (ref 22–32)
Creatinine, Ser: 1.21 mg/dL (ref 0.61–1.24)
GFR calc Af Amer: >60 mL/min (ref >60)
GFR calc non Af Amer: >60 mL/min (ref >60)
GLUCOSE: 107 mg/dL — AB (ref 65–99)
Potassium: 3.3 mmol/L — ABNORMAL LOW (ref 3.5–5.1)
SODIUM: 136 mmol/L (ref 135–145)
Total Bilirubin: 0.6 mg/dL (ref 0.3–1.2)
Total Protein: 8.3 g/dL — ABNORMAL HIGH (ref 6.5–8.1)

## 2017-06-21 LAB — I-STAT CG4 LACTIC ACID, ED: LACTIC ACID, VENOUS: 1.26 mmol/L (ref 0.5–1.9)

## 2017-06-21 MED ORDER — VANCOMYCIN HCL IN DEXTROSE 1-5 GM/200ML-% IV SOLN
1000.0000 mg | Freq: Once | INTRAVENOUS | Status: AC
  Administered 2017-06-21: 1000 mg via INTRAVENOUS
  Filled 2017-06-21: qty 200

## 2017-06-21 MED ORDER — SODIUM CHLORIDE 0.9 % IV SOLN
1000.0000 mL | INTRAVENOUS | Status: DC
  Administered 2017-06-21: 1000 mL via INTRAVENOUS

## 2017-06-21 MED ORDER — PIPERACILLIN-TAZOBACTAM 3.375 G IVPB 30 MIN
3.3750 g | Freq: Once | INTRAVENOUS | Status: AC
  Administered 2017-06-21: 3.375 g via INTRAVENOUS
  Filled 2017-06-21: qty 50

## 2017-06-21 MED ORDER — SODIUM CHLORIDE 0.9 % IV BOLUS (SEPSIS)
1000.0000 mL | Freq: Once | INTRAVENOUS | Status: AC
  Administered 2017-06-22: 1000 mL via INTRAVENOUS

## 2017-06-21 NOTE — ED Provider Notes (Signed)
[facility]-EMERGENCY DEPT Provider Note   CSN: [id number] Arrival date & time: 06/21/17  2041     History   Chief Complaint Chief Complaint  Patient presents with  . Fever  . Chills  . Weakness  . Nausea    HPI Phillip Gill is a 41 y.o. male.  This is patient's  third visit for similar symptoms.  Patient was seen December 17 and December 18.  Patient has HIV last seen by infectious disease at [facility] October 29.  Patient is on antivirals.  Patient did have the flu vaccine.  Patient states is been getting worse.  Today with significant fever body aches and some nausea and some vomiting.  And cough and congestion.      Past Medical History:  Diagnosis Date  . Anxiety   . Arthritis    "neck" (02/16/2015)  . 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 Chronic lower back pain   . Depression   . Fibromyalgia   . Genital warts   . HIV disease (HCC) 02/28/2015  . HIV infection (HCC)    followed by Dr. Luciana Axe- sees him every 4 months  . Hypertension   . IBS (irritable bowel syndrome)   . Migraine    "none in years" (02/16/2015  . Overweight 07/20/2015  . Renal insufficiency 12/29/2013    Patient Active Problem List   Diagnosis Date Noted  . Overweight 07/20/2015  . Colitis 03/08/2015  . Diarrhea   . HIV (human immunodeficiency virus infection) (HCC)   . Tobacco abuse   . HIV disease (HCC) 02/28/2015  . Leukocytosis   . LGV (lymphogranuloma venereum)   . Primary syphilis   . Chronic hepatitis B (HCC) 10/03/2014  . Inguinal adenopathy 10/03/2014  . Human immunodeficiency virus (HIV) disease (HCC)   . Essential hypertension   . Gynecomastia 04/30/2014  . 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 Chronic pain associated with significant psychosocial dysfunction 09/18/2012  . HBV (hepatitis B virus) infection 09/18/2012  . Tobacco dependence 06/03/2012  . Fibromyalgia muscle pain 02/11/2012  . Depression 12/07/2011  . Anxiety 02/02/2011    Past  Surgical History:  Procedure Laterality Date  . CO2 LASER APPLICATION N/A 02/11/2013   Procedure: CO2 LASER APPLICATION;  Surgeon: Romie Levee, MD;  Location: [facility];  Service: General;  Laterality: N/A;  . HIGH RESOLUTION ANOSCOPY N/A 02/11/2013   Procedure: HIGH RESOLUTION ANOSCOPY WITH BIOPSY, LASER ABLATION;  Surgeon: Romie Levee, MD;  Location: [facility];  Service: General;  Laterality: N/A;  . WISDOM TOOTH EXTRACTION         Home Medications    Prior to Admission medications   Medication Sig Start Date End Date Taking? Authorizing Provider  elvitegravir-cobicistat-emtricitabine-tenofovir (GENVOYA) 150-150-200-10 MG TABS tablet Take 1 tablet by mouth daily with breakfast. 07/20/15  Yes Daiva Eves, Lisette Grinder, MD  benzonatate (TESSALON) 100 MG capsule Take 1 capsule (100 mg total) by mouth 3 (three) times daily as needed for cough. 06/17/17   Wallis Bamberg, PA-C  imiquimod (ALDARA) 5 % cream APPLY TO TO THE AFFECTED AREA THREE TIMES A WEEK Patient not taking: Reported on 06/18/2017 10/12/15   Daiva Eves, Lisette Grinder, MD  lisinopril (PRINIVIL,ZESTRIL) 20 MG tablet Take 1 tablet (20 mg total) by mouth daily. Patient not taking: Reported on 04/29/2017 07/20/15   Daiva Eves, Lisette Grinder, MD  metoprolol succinate (TOPROL-XL) 25 MG 24 hr tablet Take 1 tablet (25 mg total) by mouth  daily. Patient not taking: Reported on 06/18/2017 07/20/15   Daiva EvesVan Dam, Lisette Grinderornelius N, MD  ondansetron (ZOFRAN ODT) 8 MG disintegrating tablet 8mg  ODT q4 hours prn nausea 06/18/17   Zadie RhineWickline, Donald, MD  traMADol (ULTRAM) 50 MG tablet Take 1 tablet (50 mg total) by mouth every 6 (six) hours as needed for moderate pain. Patient not taking: Reported on 04/29/2017 12/16/15   Joni ReiningSmith, Ronald K, PA-C    Family History Family History  Problem Relation Age of Onset  . Hypertension Mother   . Diabetes Mother   . Stroke Mother        cerbral aneurysm  . Hypertension Brother   . Mental illness Neg  Hx     Social History Social History   Tobacco Use  . Smoking status: Current Every Day Smoker    Packs/day: 0.25    Years: 18.00    Pack years: 4.50    Types: Cigarettes  . Smokeless tobacco: Never Used  Substance Use Topics  . Alcohol use: Yes    Comment: "Once in a blue moon"  . Drug use: No     Allergies   Bactrim [sulfamethoxazole w/trimethoprim (co-trimoxazole)]; Cymbalta [duloxetine hcl]; Other; Zoloft [sertraline hcl]; and Neurontin [gabapentin]   Review of Systems Review of Systems  Constitutional: Positive for chills, fatigue and fever.  HENT: Positive for congestion.   Eyes: Negative for visual disturbance.  Respiratory: Positive for cough.   Cardiovascular: Negative for chest pain.  Gastrointestinal: Positive for diarrhea, nausea and vomiting. Negative for abdominal pain.  Genitourinary: Negative for dysuria.  Musculoskeletal: Positive for myalgias.  Skin: Negative for rash.  Allergic/Immunologic: Positive for immunocompromised state.  Neurological: Negative for headaches.  Hematological: Does not bruise/bleed easily.  Psychiatric/Behavioral: Negative for confusion.     Physical Exam Updated Vital Signs BP 128/87 (BP Location: Right Arm)   Pulse 97   Temp (!) 101 F (38.3 C) (Oral)   Resp 16   Ht 1.829 m (6')   Wt 87.1 kg (192 lb)   SpO2 95%   BMI 26.04 kg/m   Physical Exam  Constitutional: He is oriented to person, place, and time. He appears well-developed and well-nourished. No distress.  HENT:  Head: Normocephalic and atraumatic.  Mouth/Throat: Oropharynx is clear and moist. No oropharyngeal exudate.  Eyes: Conjunctivae and EOM are normal. Pupils are equal, round, and reactive to light.  Neck: Normal range of motion. Neck supple.  Cardiovascular: Normal heart sounds.  Tachycardic  Pulmonary/Chest: Effort normal and breath sounds normal.  Abdominal: Soft. Bowel sounds are normal.  Musculoskeletal: Normal range of motion. He exhibits no  edema.  Neurological: He is alert and oriented to person, place, and time. No cranial nerve deficit or sensory deficit. He exhibits normal muscle tone. Coordination normal.  Skin: Skin is warm.  Nursing note and vitals reviewed.    ED Treatments / Results  Labs (all labs ordered are listed, but only abnormal results are displayed) Labs Reviewed  COMPREHENSIVE METABOLIC PANEL - Abnormal; Notable for the following components:      Result Value   Potassium 3.3 (*)    Glucose, Bld 107 (*)    Calcium 8.5 (*)    Total Protein 8.3 (*)    ALT 11 (*)    All other components within normal limits  CBC WITH DIFFERENTIAL/PLATELET - Abnormal; Notable for the following components:   WBC 14.1 (*)    Platelets 132 (*)    Neutro Abs 9.6 (*)    Monocytes Absolute 1.3 (*)  All other components within normal limits  URINALYSIS, ROUTINE W REFLEX MICROSCOPIC - Abnormal; Notable for the following components:   APPearance HAZY (*)    Ketones, ur 5 (*)    Protein, ur 100 (*)    Bacteria, UA RARE (*)    Squamous Epithelial / LPF 0-5 (*)    All other components within normal limits  CULTURE, BLOOD (ROUTINE X 2)  CULTURE, BLOOD (ROUTINE X 2)  INFLUENZA PANEL BY PCR (TYPE A & B)  I-STAT CG4 LACTIC ACID, ED    EKG  EKG Interpretation  Date/Time:  Friday June 21 2017 22:06:51 EST Ventricular Rate:  100 PR Interval:    QRS Duration: 88 QT Interval:  322 QTC Calculation: 416 R Axis:   69 Text Interpretation:  Sinus tachycardia Consider left ventricular hypertrophy Anterior Q waves, possibly due to LVH Confirmed by Vanetta MuldersZackowski, Eugenio Dollins [phone] on 06/21/2017 10:18:34 PM       Radiology Dg Chest 2 View  Result Date: 06/21/2017 CLINICAL DATA:  Fever cold chills EXAM: CHEST  2 VIEW COMPARISON:  06/18/2017, 03/29/ 2017 FINDINGS: Mild central airways thickening. No focal pulmonary opacity or effusion. Normal heart size. No pneumothorax. IMPRESSION: Mild perihilar interstitial opacity could reflect  viral process/bronchitis. No focal pneumonia is seen. Electronically Signed   By: Jasmine PangKim  Fujinaga M.D.   On: 06/21/2017 22:07    Procedures Procedures (including critical care time)  CRITICAL CARE Performed by: Vanetta MuldersZACKOWSKI,Kylynn Street Total critical care time: 30 minutes Critical care time was exclusive of separately billable procedures and treating other patients. Critical care was necessary to treat or prevent imminent or life-threatening deterioration. Critical care was time spent personally by me on the following activities: development of treatment plan with patient and/or surrogate as well as nursing, discussions with consultants, evaluation of patient's response to treatment, examination of patient, obtaining history from patient or surrogate, ordering and performing treatments and interventions, ordering and review of laboratory studies, ordering and review of radiographic studies, pulse oximetry and re-evaluation of patient's condition.   Medications Ordered in ED Medications  vancomycin (VANCOCIN) IVPB 1000 mg/200 mL premix (not administered)  0.9 %  sodium chloride infusion (1,000 mLs Intravenous New Bag/Given 06/21/17 2216)  sodium chloride 0.9 % bolus 1,000 mL (not administered)  piperacillin-tazobactam (ZOSYN) IVPB 3.375 g (3.375 g Intravenous New Bag/Given 06/21/17 2217)     Initial Impression / Assessment and Plan / ED Course  I have reviewed the triage vital signs and the nursing notes.  Pertinent labs & imaging results that were available during my care of the patient were reviewed by me and considered in my medical decision making (see chart for details).     Patient's vital signs consistent with sepsis.  Tachycardic marked fever to 103.  Started on sepsis protocol.  Received Zosyn and vancomycin.  Along with blood cultures.  Chest x-ray negative for pneumonia.  Lactic acid came back less than 2 which is reassuring.  Patient symptoms somewhat consistent with influenza type  illness he did have the flu shot.  Heart rate improved but still around low 100s.  Flu test sent.  Hospitalist will admit.       Final Clinical Impressions(s) / ED Diagnoses   Final diagnoses:  Sepsis, due to unspecified organism (HCC)  HIV (human immunodeficiency virus infection) (HCC)  Flu-like symptoms    ED Discharge Orders    None       Vanetta MuldersZackowski, Braidyn Scorsone, MD 06/21/17 2259

## 2017-06-21 NOTE — H&P (Signed)
History and Physical   [facility] [place] [facility] Ad[facility] History and Physical [employer], D.O.    Patient Name: Phillip Gill MR#: [id number] Date of Birth: [birth year] Date of Admission: 06/21/2017  Referring MD/NP/PA: Dr. Deretha Emory Primary Care Physician: Patient, No Pcp Per  Chief Complaint:  Chief Complaint  Patient presents with  . Fever  . Chills  . Weakness  . Nausea    HPI: Phillip Gill is a 42 y.o. male with a known history of HIV, followed by ID and on antiretrovirals, hypertension, fibromyalgia, anxiety and depression, chronic kidney disease presents to the emergency department for evaluation of generalized weakness, fevers and chills, nausea, dry cough and muscle aches for the past 5 days. This is the patient's third visit here this week.  Patient denies dizziness, chest pain, shortness of breath, abdominal pain, dysuria/frequency, changes in mental status.    Otherwise there has been no change in status. Patient has been taking medication as prescribed and there has been no recent change in medication or diet.  No recent antibiotics.  There has been no recent illness, hospitalizations, travel.  He does work in a nursing home.   EMS/ED Course: Patient received Zosyn, vancomycin, IV fluids. Medical admission has been requested for further management of sepsis, unclear etiology.  Review of Systems:  CONSTITUTIONAL: No fever/chills, fatigue,  weight gain/loss, headache. Positive generalized weakness. EYES: No blurry or double vision. ENT: No tinnitus, postnasal drip, redness or soreness of the oropharynx. RESPIRATORY: Positive cough, negative dyspnea, wheeze.  No hemoptysis.  CARDIOVASCULAR: No chest pain, palpitations, syncope, orthopnea. No lower extremity edema.  GASTROINTESTINAL: Positive nausea, negative vomiting, abdominal pain, diarrhea, constipation.  No hematemesis, melena or hematochezia. GENITOURINARY: No dysuria,  frequency, hematuria. ENDOCRINE: No polyuria or nocturia. No heat or cold intolerance. HEMATOLOGY: No anemia, bruising, bleeding. INTEGUMENTARY: No rashes, ulcers, lesions. MUSCULOSKELETAL: No arthritis, gout, dyspnea. NEUROLOGIC: No numbness, tingling, ataxia, seizure-type activity, weakness. PSYCHIATRIC: No anxiety, depression, insomnia.   Past Medical History:  Diagnosis Date  . Anxiety   . Arthritis    "neck" (02/16/2015)  . 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 Chronic lower back pain   . Depression   . Fibromyalgia   . Genital warts   . HIV disease (HCC) 02/28/2015  . HIV infection (HCC)    followed by Dr. Luciana Axe- sees him every 4 months  . Hypertension   . IBS (irritable bowel syndrome)   . Migraine    "none in years" (02/16/2015  . Overweight 07/20/2015  . Renal insufficiency 12/29/2013    Past Surgical History:  Procedure Laterality Date  . CO2 LASER APPLICATION N/A 02/11/2013   Procedure: CO2 LASER APPLICATION;  Surgeon: Romie Levee, MD;  Location: [facility];  Service: General;  Laterality: N/A;  . HIGH RESOLUTION ANOSCOPY N/A 02/11/2013   Procedure: HIGH RESOLUTION ANOSCOPY WITH BIOPSY, LASER ABLATION;  Surgeon: Romie Levee, MD;  Location: [facility];  Service: General;  Laterality: N/A;  . WISDOM TOOTH EXTRACTION       reports that he has been smoking cigarettes.  He has a 4.50 pack-year smoking history. he has never used smokeless tobacco. He reports that he drinks alcohol. He reports that he does not use drugs.  Allergies  Allergen Reactions  . Bactrim [Sulfamethoxazole W/Trimethoprim (Co-Trimoxazole)] Hives and Shortness Of Breath  . Cymbalta [Duloxetine Hcl] Diarrhea  . Other Hives and Swelling    Colgate toothpaste   . Zoloft [Sertraline  Hcl] Diarrhea  . Neurontin [Gabapentin] Rash    Family History  Problem Relation Age of Onset  . Hypertension Mother   . Diabetes Mother   . Stroke Mother        cerbral  aneurysm  . Hypertension Brother   . Mental illness Neg Hx     Prior to Admission medications   Medication Sig Start Date End Date Taking? Authorizing Provider  elvitegravir-cobicistat-emtricitabine-tenofovir (GENVOYA) 150-150-200-10 MG TABS tablet Take 1 tablet by mouth daily with breakfast. 07/20/15  Yes Daiva EvesVan Dam, Lisette Grinderornelius N, MD  benzonatate (TESSALON) 100 MG capsule Take 1 capsule (100 mg total) by mouth 3 (three) times daily as needed for cough. 06/17/17   Wallis BambergMani, Mario, PA-C  imiquimod (ALDARA) 5 % cream APPLY TO TO THE AFFECTED AREA THREE TIMES A WEEK Patient not taking: Reported on 06/18/2017 10/12/15   Daiva EvesVan Dam, Lisette Grinderornelius N, MD  lisinopril (PRINIVIL,ZESTRIL) 20 MG tablet Take 1 tablet (20 mg total) by mouth daily. Patient not taking: Reported on 04/29/2017 07/20/15   Daiva EvesVan Dam, Lisette Grinderornelius N, MD  metoprolol succinate (TOPROL-XL) 25 MG 24 hr tablet Take 1 tablet (25 mg total) by mouth daily. Patient not taking: Reported on 06/18/2017 07/20/15   Daiva EvesVan Dam, Lisette Grinderornelius N, MD  ondansetron (ZOFRAN ODT) 8 MG disintegrating tablet 8mg  ODT q4 hours prn nausea 06/18/17   Zadie RhineWickline, Donald, MD  traMADol (ULTRAM) 50 MG tablet Take 1 tablet (50 mg total) by mouth every 6 (six) hours as needed for moderate pain. Patient not taking: Reported on 04/29/2017 12/16/15   Joni ReiningSmith, Ronald K, PA-C    Physical Exam: Vitals:   06/21/17 2057 06/21/17 2110 06/21/17 2141  BP: (!) 148/98  128/87  Pulse: (!) 113  97  Resp: 20  16  Temp: (!) 103 F (39.4 C)  (!) 101 F (38.3 C)  TempSrc: Oral  Oral  SpO2: 92% 100% 95%  Weight:  87.1 kg (192 lb)   Height:  6' (1.829 m)     GENERAL: 41 y.o.-year-old male patient, well-developed, well-nourished lying in the bed in no acute distress.  Pleasant and cooperative.   HEENT: Head atraumatic, normocephalic. Pupils equal. Mucus membranes moist. NECK: Supple, full range of motion. No JVD, no bruit heard. No thyroid enlargement, no tenderness, no cervical lymphadenopathy. CHEST:  Normal breath sounds bilaterally. No wheezing, rales, rhonchi or crackles. No use of accessory muscles of respiration.  No reproducible chest wall tenderness.  CARDIOVASCULAR: S1, S2 normal. No murmurs, rubs, or gallops. Cap refill <2 seconds. Pulses intact distally.  ABDOMEN: Soft, nondistended, nontender. No rebound, guarding, rigidity. Normoactive bowel sounds present in all four quadrants.  EXTREMITIES: No pedal edema, cyanosis, or clubbing. No calf tenderness or Homan's sign.  NEUROLOGIC: The patient is alert and oriented x 3. Cranial nerves II through XII are grossly intact with no focal sensorimotor deficit. PSYCHIATRIC:  Normal affect, mood, thought content. SKIN: Warm, dry, and intact without obvious rash, lesion, or ulcer.    Labs on Admission:  CBC: Recent Labs  Lab 06/18/17 0039 06/21/17 2130  WBC 9.9 14.1*  NEUTROABS  --  9.6*  HGB 13.7 13.7  HCT 41.5 40.5  MCV 92.2 91.2  PLT 167 132*   Basic Metabolic Panel: Recent Labs  Lab 06/18/17 0039 06/21/17 2130  NA 136 136  K 3.4* 3.3*  CL 102 102  CO2 24 25  GLUCOSE 94 107*  BUN 14 10  CREATININE 1.55* 1.21  CALCIUM 8.5* 8.5*   GFR: Estimated Creatinine Clearance: 89.1 mL/min (  by C-G formula based on SCr of 1.21 mg/dL). Liver Function Tests: Recent Labs  Lab 06/18/17 0039 06/21/17 2130  AST 20 25  ALT 10* 11*  ALKPHOS 70 55  BILITOT 0.7 0.6  PROT 7.6 8.3*  ALBUMIN 4.1 3.9   Recent Labs  Lab 06/18/17 0039  LIPASE 37   No results for input(s): AMMONIA in the last 168 hours. Coagulation Profile: No results for input(s): INR, PROTIME in the last 168 hours. Cardiac Enzymes: No results for input(s): CKTOTAL, CKMB, CKMBINDEX, TROPONINI in the last 168 hours. BNP (last 3 results) No results for input(s): PROBNP in the last 8760 hours. HbA1C: No results for input(s): HGBA1C in the last 72 hours. CBG: No results for input(s): GLUCAP in the last 168 hours. Lipid Profile: No results for input(s): CHOL,  HDL, LDLCALC, TRIG, CHOLHDL, LDLDIRECT in the last 72 hours. Thyroid Function Tests: No results for input(s): TSH, T4TOTAL, FREET4, T3FREE, THYROIDAB in the last 72 hours. Anemia Panel: No results for input(s): VITAMINB12, FOLATE, FERRITIN, TIBC, IRON, RETICCTPCT in the last 72 hours. Urine analysis:    Component Value Date/Time   COLORURINE YELLOW 06/21/2017 2139   APPEARANCEUR HAZY (A) 06/21/2017 2139   LABSPEC 1.029 06/21/2017 2139   PHURINE 5.0 06/21/2017 2139   GLUCOSEU NEGATIVE 06/21/2017 2139   HGBUR NEGATIVE 06/21/2017 2139   BILIRUBINUR NEGATIVE 06/21/2017 2139   KETONESUR 5 (A) 06/21/2017 2139   PROTEINUR 100 (A) 06/21/2017 2139   UROBILINOGEN 0.2 03/08/2015 0228   NITRITE NEGATIVE 06/21/2017 2139   LEUKOCYTESUR NEGATIVE 06/21/2017 2139   Sepsis Labs: @LABRCNTIP (procalcitonin:4,lacticidven:4) ) Recent Results (from the past 240 hour(s))  Rapid strep screen     Status: None   Collection Time: 06/17/17  3:54 PM  Result Value Ref Range Status   Streptococcus, Group A Screen (Direct) NEGATIVE NEGATIVE Final    Comment: (NOTE) A Rapid Antigen test may result negative if the antigen level in the sample is below the detection level of this test. The FDA has not cleared this test as a stand-alone test therefore the rapid antigen negative result has reflexed to a Group A Strep culture.   Culture, group A strep     Status: None   Collection Time: 06/17/17  3:54 PM  Result Value Ref Range Status   Specimen Description THROAT  Final   Special Requests NONE Reflexed from [id number]  Final   Culture   Final    NO GROUP A STREP (S.PYOGENES) ISOLATED Performed at [facility], [address], [place], [place] [address]    Report Status 06/20/2017 FINAL  Final     Radiological Exams on Admission: Dg Chest 2 View  Result Date: 06/21/2017 CLINICAL DATA:  Fever cold chills EXAM: CHEST  2 VIEW COMPARISON:  06/18/2017, 03/29/ 2017 FINDINGS: Mild central airways thickening. No  focal pulmonary opacity or effusion. Normal heart size. No pneumothorax. IMPRESSION: Mild perihilar interstitial opacity could reflect viral process/bronchitis. No focal pneumonia is seen. Electronically Signed   By: Jasmine Pang M.D.   On: 06/21/2017 22:07    EKG: Sinus tachycardia at 100 bpm with normal axis and nonspecific ST-T wave changes.   Assessment/Plan  This is a 41 y.o. male with a history of HIV, followed by ID and on antiretrovirals, hypertension, fibromyalgia, anxiety and depression, chronic kidney disease now being admitted with:  #. Sepsis secondary to unclear etiology - Admit to inpatient with telemetry monitoring - IV antibiotics: Zosyn and vancomycin - IV fluid hydration - Follow-up flu swab -  Follow up blood,urine & sputum cultures - Repeat CBC in am.  - Infectious disease consultation has been requested  #. Hypokalemia, mild -Replace IV -Recheck BMP in a.m.  #. History of hypertension -Monitor. -Patient not taking his medications which include metoprolol and lisinopril  #. History of HIV - Continue Genvoya  Admission status: Inpatient IV Fluids: Normal saline Diet/Nutrition: Heart healthy Consults called: Infectious disease  DVT Px:  SCDs and early ambulation. Code Status: Full Code  Disposition Plan: To home in 2-3 days  All the records are reviewed and case discussed with ED provider. Management plans discussed with the patient and/or family who express understanding and agree with plan of care.  Novice Vrba D.O. on 06/21/2017 at 10:38 PM Between 7am to 6pm - Pager - [phone] After 6pm go to [email or web address] - password [email or web address] [facility] [place] Office [phone] CC: Primary care physician; Patient, No Pcp Per   06/21/2017, 10:38 PM

## 2017-06-21 NOTE — ED Triage Notes (Signed)
Patient complaining of fever, cold chills, weakness with walking, loss of appetite, nauseated, and legs are swollen. Patient states this started on Monday. Patient states it is his third time here this week. Patient has a hx of Hiv.

## 2017-06-21 NOTE — ED Notes (Signed)
Patient transported to X-ray 

## 2017-06-21 NOTE — Progress Notes (Signed)
A consult was received from an ED physician for Vancomycin and Zosyn per pharmacy dosing.  The patient's profile has been reviewed for ht/wt/allergies/indication/available labs.   A one time order has been placed for Vancomycin 1gm, Zosyn 3.375gm .  Further antibiotics/pharmacy consults should be ordered by admitting physician if indicated.                       Thank you, Otho BellowsGreen, Daveon Arpino L 06/21/2017  9:51 PM

## 2017-06-22 ENCOUNTER — Encounter (HOSPITAL_COMMUNITY): Payer: Self-pay

## 2017-06-22 LAB — COMPREHENSIVE METABOLIC PANEL
ALK PHOS: 46 U/L (ref 38–126)
ALT: 8 U/L — ABNORMAL LOW (ref 17–63)
ANION GAP: 9 (ref 5–15)
AST: 20 U/L (ref 15–41)
Albumin: 3.1 g/dL — ABNORMAL LOW (ref 3.5–5.0)
BUN: 7 mg/dL (ref 6–20)
CALCIUM: 7.6 mg/dL — AB (ref 8.9–10.3)
CO2: 24 mmol/L (ref 22–32)
Chloride: 102 mmol/L (ref 101–111)
Creatinine, Ser: 1.13 mg/dL (ref 0.61–1.24)
GFR calc non Af Amer: >60 mL/min (ref >60)
Glucose, Bld: 102 mg/dL — ABNORMAL HIGH (ref 65–99)
POTASSIUM: 3.2 mmol/L — AB (ref 3.5–5.1)
SODIUM: 135 mmol/L (ref 135–145)
Total Bilirubin: 0.9 mg/dL (ref 0.3–1.2)
Total Protein: 6.7 g/dL (ref 6.5–8.1)

## 2017-06-22 LAB — CBC
HCT: 36.7 % — ABNORMAL LOW (ref 39.0–52.0)
HEMOGLOBIN: 12.1 g/dL — AB (ref 13.0–17.0)
MCH: 30.1 pg (ref 26.0–34.0)
MCHC: 33 g/dL (ref 30.0–36.0)
MCV: 91.3 fL (ref 78.0–100.0)
Platelets: 121 10*3/uL — ABNORMAL LOW (ref 150–400)
RBC: 4.02 MIL/uL — ABNORMAL LOW (ref 4.22–5.81)
RDW: 13.1 % (ref 11.5–15.5)
WBC: 14.8 10*3/uL — ABNORMAL HIGH (ref 4.0–10.5)

## 2017-06-22 LAB — PROTIME-INR
INR: 1.05
Prothrombin Time: 13.6 seconds (ref 11.4–15.2)

## 2017-06-22 LAB — INFLUENZA PANEL BY PCR (TYPE A & B)
Influenza A By PCR: POSITIVE — AB
Influenza B By PCR: NEGATIVE

## 2017-06-22 LAB — APTT: APTT: 35 s (ref 24–36)

## 2017-06-22 LAB — PROCALCITONIN: Procalcitonin: 0.14 ng/mL

## 2017-06-22 MED ORDER — ZOLPIDEM TARTRATE 5 MG PO TABS: 5.0000 mg | ORAL_TABLET | Freq: Every evening | ORAL | Status: DC | PRN

## 2017-06-22 MED ORDER — ONDANSETRON HCL 4 MG/2ML IJ SOLN: 4.0000 mg | Freq: Four times a day (QID) | INTRAMUSCULAR | Status: DC | PRN

## 2017-06-22 MED ORDER — OSELTAMIVIR PHOSPHATE 75 MG PO CAPS
75.0000 mg | ORAL_CAPSULE | Freq: Two times a day (BID) | ORAL | Status: DC
  Administered 2017-06-22 – 2017-06-24 (×5): 75 mg via ORAL
  Filled 2017-06-22 (×5): qty 1

## 2017-06-22 MED ORDER — ACETAMINOPHEN 650 MG RE SUPP: 650.0000 mg | Freq: Four times a day (QID) | RECTAL | Status: DC | PRN

## 2017-06-22 MED ORDER — LEVOFLOXACIN IN D5W 750 MG/150ML IV SOLN: 750.0000 mg | INTRAVENOUS | Status: DC

## 2017-06-22 MED ORDER — ALBUTEROL SULFATE (2.5 MG/3ML) 0.083% IN NEBU: 2.5000 mg | INHALATION_SOLUTION | Freq: Four times a day (QID) | RESPIRATORY_TRACT | Status: DC | PRN

## 2017-06-22 MED ORDER — SENNOSIDES-DOCUSATE SODIUM 8.6-50 MG PO TABS: 1.0000 | ORAL_TABLET | Freq: Every evening | ORAL | Status: DC | PRN

## 2017-06-22 MED ORDER — POTASSIUM CHLORIDE CRYS ER 20 MEQ PO TBCR
40.0000 meq | EXTENDED_RELEASE_TABLET | Freq: Once | ORAL | Status: AC
  Administered 2017-06-22: 40 meq via ORAL
  Filled 2017-06-22: qty 2

## 2017-06-22 MED ORDER — BISACODYL 5 MG PO TBEC: 5.0000 mg | DELAYED_RELEASE_TABLET | Freq: Every day | ORAL | Status: DC | PRN

## 2017-06-22 MED ORDER — PIPERACILLIN-TAZOBACTAM 3.375 G IVPB
3.3750 g | Freq: Three times a day (TID) | INTRAVENOUS | Status: DC
  Administered 2017-06-22 – 2017-06-24 (×6): 3.375 g via INTRAVENOUS
  Filled 2017-06-22 (×8): qty 50

## 2017-06-22 MED ORDER — MAGNESIUM CITRATE PO SOLN: 1.0000 | Freq: Once | ORAL | Status: DC | PRN

## 2017-06-22 MED ORDER — IPRATROPIUM-ALBUTEROL 0.5-2.5 (3) MG/3ML IN SOLN: 3.0000 mL | Freq: Four times a day (QID) | RESPIRATORY_TRACT | Status: DC | PRN

## 2017-06-22 MED ORDER — POTASSIUM CHLORIDE CRYS ER 20 MEQ PO TBCR
40.0000 meq | EXTENDED_RELEASE_TABLET | Freq: Two times a day (BID) | ORAL | Status: AC
  Administered 2017-06-22 (×2): 40 meq via ORAL
  Filled 2017-06-22 (×2): qty 2

## 2017-06-22 MED ORDER — VANCOMYCIN HCL IN DEXTROSE 1-5 GM/200ML-% IV SOLN
1000.0000 mg | Freq: Two times a day (BID) | INTRAVENOUS | Status: DC
  Administered 2017-06-22 – 2017-06-23 (×4): 1000 mg via INTRAVENOUS
  Filled 2017-06-22 (×5): qty 200

## 2017-06-22 MED ORDER — PIPERACILLIN-TAZOBACTAM 3.375 G IVPB 30 MIN: 3.3750 g | Freq: Once | INTRAVENOUS | Status: DC

## 2017-06-22 MED ORDER — ELVITEG-COBIC-EMTRICIT-TENOFAF 150-150-200-10 MG PO TABS
1.0000 | ORAL_TABLET | Freq: Every day | ORAL | Status: DC
  Administered 2017-06-22 – 2017-06-24 (×3): 1 via ORAL
  Filled 2017-06-22 (×3): qty 1

## 2017-06-22 MED ORDER — ONDANSETRON HCL 4 MG PO TABS: 4.0000 mg | ORAL_TABLET | Freq: Four times a day (QID) | ORAL | Status: DC | PRN

## 2017-06-22 MED ORDER — IPRATROPIUM BROMIDE 0.02 % IN SOLN: 0.5000 mg | Freq: Four times a day (QID) | RESPIRATORY_TRACT | Status: DC | PRN

## 2017-06-22 MED ORDER — GUAIFENESIN ER 600 MG PO TB12: 600.0000 mg | ORAL_TABLET | Freq: Two times a day (BID) | ORAL | Status: DC | PRN

## 2017-06-22 MED ORDER — ACETAMINOPHEN 325 MG PO TABS: 650.0000 mg | ORAL_TABLET | Freq: Four times a day (QID) | ORAL | Status: DC | PRN

## 2017-06-22 MED ORDER — HYDROCODONE-ACETAMINOPHEN 5-325 MG PO TABS: 1.0000 | ORAL_TABLET | ORAL | Status: DC | PRN

## 2017-06-22 MED ORDER — SODIUM CHLORIDE 0.9 % IV SOLN
INTRAVENOUS | Status: DC
  Administered 2017-06-22: 16:00:00 via INTRAVENOUS
  Administered 2017-06-23: 1000 mL via INTRAVENOUS
  Administered 2017-06-23: 03:00:00 via INTRAVENOUS

## 2017-06-22 NOTE — Progress Notes (Signed)
Pharmacy Antibiotic Note  Marilynne HalstedShanna M Wakeley is a 41 y.o. male admitted on 06/21/2017 with sepsis.  Pharmacy has been consulted for Vancomycin and Zosyn dosing.  Plan: Zosyn 3.375g IV q8h (4 hour infusion).  Vancomycin 1gm iv q12hr  Goal AUC = 400 - 500 for all indications, except meningitis (goal AUC > 500 and Cmin 15-20 mcg/mL)   Height: 6' (182.9 cm) Weight: 192 lb (87.1 kg) IBW/kg (Calculated) : 77.6  Temp (24hrs), Avg:101.1 F (38.4 C), Min:99.3 F (37.4 C), Max:103 F (39.4 C)  Recent Labs  Lab 06/18/17 0039 06/21/17 2130 06/21/17 2143  WBC 9.9 14.1*  --   CREATININE 1.55* 1.21  --   LATICACIDVEN  --   --  1.26    Estimated Creatinine Clearance: 89.1 mL/min (by C-G formula based on SCr of 1.21 mg/dL).    Allergies  Allergen Reactions  . Bactrim [Sulfamethoxazole W/Trimethoprim (Co-Trimoxazole)] Hives and Shortness Of Breath  . Cymbalta [Duloxetine Hcl] Diarrhea  . Other Hives and Swelling    [employer]gate toothpaste   . Zoloft [Sertraline Hcl] Diarrhea  . Neurontin [Gabapentin] Rash    Antimicrobials this admission: Vancomycin 06/21/2017 >> Zosyn 06/21/2017 >>  Dose adjustments this admission: -  Microbiology results: pending  Thank you for allowing pharmacy to be a part of this patient's care.  Aleene DavidsonGrimsley Jr, Mattis Featherly Crowford 06/22/2017 6:55 AM

## 2017-06-22 NOTE — Progress Notes (Signed)
PROGRESS NOTE    Phillip HalstedShanna M Gill  JYN:[id number] DOB: 21-May-1976 DOA: 06/21/2017 PCP: Patient, No Pcp Per   Brief Narrative: Phillip BologneseShanna Gill is a 41 y.o. male with a known history of HIV, followed by ID and on antiretrovirals, hypertension, fibromyalgia, anxiety and depression, chronic kidney disease presents to the emergency department for evaluation of generalized weakness, fevers and chills, nausea, dry cough and muscle aches for the past 5 days.     Assessment & Plan:   Active Problems:   Sepsis (HCC)   SIRS:  Probably from influenza/ viral syndrome.  Sepsis appears to be ruled out .  Blood cultures and urine cultures are pending.  Resume IV fluids, tamiflu . Symptomatic management.  Can d.c IV antibiotics if blood and urine cultures remain negative for 48 hours.  ID consulted by admission physician.     Hypokalemia: replace as needed. Repeat in am.    Hypertension;  Better controlled today. Was on metoprolol at home, which he is not taking at this time.     DVT prophylaxis: scd's Code Status: full code.  Family Communication: none at bedside., discussed the plan with the patient.  Disposition Plan: home tomorrow if afebrile, blood cultures negative.   Consultants:   ID.   Procedures:none.  Antimicrobials: vancomycin and zosyn since admission and tamiflu started today.   Subjective: Persistent cough. No sob, no appetite, no abdominal pain, or chest pain.  No nausea or vomiting.   Objective: Vitals:   06/22/17 0700 06/22/17 0800 06/22/17 0904 06/22/17 1334  BP: 127/75 108/63  (!) 133/92  Pulse: 92 91  87  Resp: (!) 26 (!) 27 16 16   Temp:  99.6 F (37.6 C) 99.5 F (37.5 C) 98.7 F (37.1 C)  TempSrc:  Oral Oral Oral  SpO2: 95% 93% 95% 98%  Weight:      Height:        Intake/Output Summary (Last 24 hours) at 06/22/2017 1537 Last data filed at 06/22/2017 1335 Gross per 24 hour  Intake 480 ml  Output -  Net 480 ml   Filed Weights   06/21/17  2110  Weight: 87.1 kg (192 lb)    Examination:  General exam: Appears calm and comfortable  Respiratory system: Clear to auscultation. Respiratory effort normal. Cardiovascular system: S1 & S2 heard, RRR. No JVD, murmurs, rubs, gallops or clicks. No pedal edema. Gastrointestinal system: Abdomen is nondistended, soft and nontender. No organomegaly or masses felt. Normal bowel sounds heard. Central nervous system: Alert and oriented. No focal neurological deficits. Extremities: Symmetric 5 x 5 power. Skin: No rashes, lesions or ulcers Psychiatry: Judgement and insight appear normal. Mood & affect appropriate.     Data Reviewed: I have personally reviewed following labs and imaging studies  CBC: Recent Labs  Lab 06/18/17 0039 06/21/17 2130 06/22/17 0659  WBC 9.9 14.1* 14.8*  NEUTROABS  --  9.6*  --   HGB 13.7 13.7 12.1*  HCT 41.5 40.5 36.7*  MCV 92.2 91.2 91.3  PLT 167 132* 121*   Basic Metabolic Panel: Recent Labs  Lab 06/18/17 0039 06/21/17 2130 06/22/17 0659  NA 136 136 135  K 3.4* 3.3* 3.2*  CL 102 102 102  CO2 24 25 24   GLUCOSE 94 107* 102*  BUN 14 10 7   CREATININE 1.55* 1.21 1.13  CALCIUM 8.5* 8.5* 7.6*   GFR: Estimated Creatinine Clearance: 95.4 mL/min (by C-G formula based on SCr of 1.13 mg/dL). Liver Function Tests: Recent Labs  Lab 06/18/17 0039 06/21/17 2130 06/22/17  0659  AST 20 25 20   ALT 10* 11* 8*  ALKPHOS 70 55 46  BILITOT 0.7 0.6 0.9  PROT 7.6 8.3* 6.7  ALBUMIN 4.1 3.9 3.1*   Recent Labs  Lab 06/18/17 0039  LIPASE 37   No results for input(s): AMMONIA in the last 168 hours. Coagulation Profile: Recent Labs  Lab 06/22/17 0659  INR 1.05   Cardiac Enzymes: No results for input(s): CKTOTAL, CKMB, CKMBINDEX, TROPONINI in the last 168 hours. BNP (last 3 results) No results for input(s): PROBNP in the last 8760 hours. HbA1C: No results for input(s): HGBA1C in the last 72 hours. CBG: No results for input(s): GLUCAP in the last 168  hours. Lipid Profile: No results for input(s): CHOL, HDL, LDLCALC, TRIG, CHOLHDL, LDLDIRECT in the last 72 hours. Thyroid Function Tests: No results for input(s): TSH, T4TOTAL, FREET4, T3FREE, THYROIDAB in the last 72 hours. Anemia Panel: No results for input(s): VITAMINB12, FOLATE, FERRITIN, TIBC, IRON, RETICCTPCT in the last 72 hours. Sepsis Labs: Recent Labs  Lab 06/21/17 2143 06/22/17 0659  PROCALCITON  --  0.14  LATICACIDVEN 1.26  --     Recent Results (from the past 240 hour(s))  Rapid strep screen     Status: None   Collection Time: 06/17/17  3:54 PM  Result Value Ref Range Status   Streptococcus, Group A Screen (Direct) NEGATIVE NEGATIVE Final    Comment: (NOTE) A Rapid Antigen test may result negative if the antigen level in the sample is below the detection level of this test. The FDA has not cleared this test as a stand-alone test therefore the rapid antigen negative result has reflexed to a Group A Strep culture.   Culture, group A strep     Status: None   Collection Time: 06/17/17  3:54 PM  Result Value Ref Range Status   Specimen Description THROAT  Final   Special Requests NONE Reflexed from [id number]  Final   Culture   Final    NO GROUP A STREP (S.PYOGENES) ISOLATED Performed at [facility], [address] [place], [place] [address]    Report Status 06/20/2017 FINAL  Final         Radiology Studies: Dg Chest 2 View  Result Date: 06/21/2017 CLINICAL DATA:  Fever cold chills EXAM: CHEST  2 VIEW COMPARISON:  06/18/2017, 03/29/ 2017 FINDINGS: Mild central airways thickening. No focal pulmonary opacity or effusion. Normal heart size. No pneumothorax. IMPRESSION: Mild perihilar interstitial opacity could reflect viral process/bronchitis. No focal pneumonia is seen. Electronically Signed   By: Phillip PangKim  Gill M.D.   On: 06/21/2017 22:07        Scheduled Meds: . elvitegravir-cobicistat-emtricitabine-tenofovir  1 tablet Oral Q breakfast  . oseltamivir   75 mg Oral BID  . potassium chloride  40 mEq Oral BID   Continuous Infusions: . sodium chloride 1,000 mL (06/21/17 2216)  . sodium chloride 100 mL/hr at 06/22/17 1534  . piperacillin-tazobactam (ZOSYN)  IV 3.375 g (06/22/17 1518)  . vancomycin 1,000 mg (06/22/17 1017)     LOS: 1 day    Time spent: 35 min    Kathlen ModyVijaya Javen Ridings, MD [facility] Pager [phone]   If 7PM-7AM, please contact night-coverage [email or web address] Password [email or web address] 06/22/2017, 3:37 PM

## 2017-06-23 LAB — BASIC METABOLIC PANEL
ANION GAP: 5 (ref 5–15)
BUN: 5 mg/dL — ABNORMAL LOW (ref 6–20)
CHLORIDE: 106 mmol/L (ref 101–111)
CO2: 28 mmol/L (ref 22–32)
CREATININE: 1.14 mg/dL (ref 0.61–1.24)
Calcium: 8.7 mg/dL — ABNORMAL LOW (ref 8.9–10.3)
GFR calc non Af Amer: >60 mL/min (ref >60)
Glucose, Bld: 114 mg/dL — ABNORMAL HIGH (ref 65–99)
POTASSIUM: 4 mmol/L (ref 3.5–5.1)
Sodium: 139 mmol/L (ref 135–145)

## 2017-06-23 NOTE — Progress Notes (Signed)
PROGRESS NOTE    Phillip Gill  ONG:[id number] DOB: [birth year] DOA: 06/21/2017 PCP: Patient, No Pcp Per   Brief Narrative: Phillip Gill is a 41 y.o. male with a known history of HIV, followed by ID and on antiretrovirals, hypertension, fibromyalgia, anxiety and depression, chronic kidney disease presents to the emergency department for evaluation of generalized weakness, fevers and chills, nausea, dry cough and muscle aches for the past 5 days.     Assessment & Plan:   Active Problems:   Sepsis (HCC)   SIRS:  Probably from influenza/ viral syndrome.  Sepsis appears to be ruled out .  Blood cultures and urine cultures are pending. So far they  Are negative.  Resume IV fluids, tamiflu . Symptomatic management.  Can d.c IV antibiotics if blood and urine cultures remain negative by tomorrow.    Hypokalemia: replace as needed. Repeat in am.    Hypertension;  Better controlled today. Was on metoprolol at home, which he is not taking at this time.     DVT prophylaxis: scd's Code Status: full code.  Family Communication: none at bedside., discussed the plan with the patient.  Disposition Plan: home tomorrow if afebrile, blood cultures negative.   Consultants:   ID.   Procedures:none.  Antimicrobials: vancomycin and zosyn since admission and tamiflu started today.   Subjective:  No sob, no appetite, no abdominal pain, or chest pain.  No nausea or vomiting.   Objective: Vitals:   06/22/17 1334 06/22/17 2101 06/23/17 0503 06/23/17 1453  BP: (!) 133/92 (!) 144/92 (!) 141/100 127/90  Pulse: 87 84 82 68  Resp: 16 14 13 15   Temp: 98.7 F (37.1 C) 98.3 F (36.8 C) 98.3 F (36.8 C) 98.1 F (36.7 C)  TempSrc: Oral Oral Oral Oral  SpO2: 98% 99% 100% 98%  Weight:      Height:        Intake/Output Summary (Last 24 hours) at 06/23/2017 1744 Last data filed at 06/23/2017 1414 Gross per 24 hour  Intake 3886.67 ml  Output 2410 ml  Net 1476.67 ml   Filed Weights    06/21/17 2110  Weight: 87.1 kg (192 lb)    Examination: unchanged from yesterday  General exam: Appears calm and comfortable  Respiratory system: Clear to auscultation. Respiratory effort normal. Cardiovascular system: S1 & S2 heard, RRR. No JVD, murmurs, rubs, gallops or clicks. No pedal edema. Gastrointestinal system: Abdomen is nondistended, soft and nontender. No organomegaly or masses felt. Normal bowel sounds heard. Central nervous system: Alert and oriented. No focal neurological deficits. Extremities: Symmetric 5 x 5 power. Skin: No rashes, lesions or ulcers Psychiatry: Judgement and insight appear normal. Mood & affect appropriate.     Data Reviewed: I have personally reviewed following labs and imaging studies  CBC: Recent Labs  Lab 06/18/17 0039 06/21/17 2130 06/22/17 0659  WBC 9.9 14.1* 14.8*  NEUTROABS  --  9.6*  --   HGB 13.7 13.7 12.1*  HCT 41.5 40.5 36.7*  MCV 92.2 91.2 91.3  PLT 167 132* 121*   Basic Metabolic Panel: Recent Labs  Lab 06/18/17 0039 06/21/17 2130 06/22/17 0659  NA 136 136 135  K 3.4* 3.3* 3.2*  CL 102 102 102  CO2 24 25 24   GLUCOSE 94 107* 102*  BUN 14 10 7   CREATININE 1.55* 1.21 1.13  CALCIUM 8.5* 8.5* 7.6*   GFR: Estimated Creatinine Clearance: 95.4 mL/min (by C-G formula based on SCr of 1.13 mg/dL). Liver Function Tests: Recent Labs  Lab 06/18/17  [id number] 06/21/17 2130 06/22/17 0659  AST 20 25 20   ALT 10* 11* 8*  ALKPHOS 70 55 46  BILITOT 0.7 0.6 0.9  PROT 7.6 8.3* 6.7  ALBUMIN 4.1 3.9 3.1*   Recent Labs  Lab 06/18/17 0039  LIPASE 37   No results for input(s): AMMONIA in the last 168 hours. Coagulation Profile: Recent Labs  Lab 06/22/17 0659  INR 1.05   Cardiac Enzymes: No results for input(s): CKTOTAL, CKMB, CKMBINDEX, TROPONINI in the last 168 hours. BNP (last 3 results) No results for input(s): PROBNP in the last 8760 hours. HbA1C: No results for input(s): HGBA1C in the last 72 hours. CBG: No results  for input(s): GLUCAP in the last 168 hours. Lipid Profile: No results for input(s): CHOL, HDL, LDLCALC, TRIG, CHOLHDL, LDLDIRECT in the last 72 hours. Thyroid Function Tests: No results for input(s): TSH, T4TOTAL, FREET4, T3FREE, THYROIDAB in the last 72 hours. Anemia Panel: No results for input(s): VITAMINB12, FOLATE, FERRITIN, TIBC, IRON, RETICCTPCT in the last 72 hours. Sepsis Labs: Recent Labs  Lab 06/21/17 2143 06/22/17 0659  PROCALCITON  --  0.14  LATICACIDVEN 1.26  --     Recent Results (from the past 240 hour(s))  Rapid strep screen     Status: None   Collection Time: 06/17/17  3:54 PM  Result Value Ref Range Status   Streptococcus, Group A Screen (Direct) NEGATIVE NEGATIVE Final    Comment: (NOTE) A Rapid Antigen test may result negative if the antigen level in the sample is below the detection level of this test. The FDA has not cleared this test as a stand-alone test therefore the rapid antigen negative result has reflexed to a Group A Strep culture.   Culture, group A strep     Status: None   Collection Time: 06/17/17  3:54 PM  Result Value Ref Range Status   Specimen Description THROAT  Final   Special Requests NONE Reflexed from [id number]  Final   Culture   Final    NO GROUP A STREP (S.PYOGENES) ISOLATED Performed at [facility], [address] [place], [place] [address]    Report Status 06/20/2017 FINAL  Final  Blood Culture (routine x 2)     Status: None (Preliminary result)   Collection Time: 06/21/17  9:35 PM  Result Value Ref Range Status   Specimen Description BLOOD LEFT FOREARM  Final   Special Requests   Final    BOTTLES DRAWN AEROBIC AND ANAEROBIC Blood Culture adequate volume   Culture   Final    NO GROWTH 1 DAY Performed at [facility], [address] [place], [place] [address]    Report Status PENDING  Incomplete  Blood Culture (routine x 2)     Status: None (Preliminary result)   Collection Time: 06/21/17 10:12 PM  Result Value  Ref Range Status   Specimen Description BLOOD RIGHT ANTECUBITAL  Final   Special Requests   Final    BOTTLES DRAWN AEROBIC AND ANAEROBIC Blood Culture adequate volume   Culture   Final    NO GROWTH 1 DAY Performed at [facility], [address] [place], [place] [address]    Report Status PENDING  Incomplete         Radiology Studies: Dg Chest 2 View  Result Date: 06/21/2017 CLINICAL DATA:  Fever cold chills EXAM: CHEST  2 VIEW COMPARISON:  06/18/2017, 03/29/ 2017 FINDINGS: Mild central airways thickening. No focal pulmonary opacity or effusion. Normal heart size.  No pneumothorax. IMPRESSION: Mild perihilar interstitial opacity could reflect viral process/bronchitis. No focal pneumonia is seen. Electronically Signed   By: Jasmine PangKim  Fujinaga M.D.   On: 06/21/2017 22:07        Scheduled Meds: . elvitegravir-cobicistat-emtricitabine-tenofovir  1 tablet Oral Q breakfast  . oseltamivir  75 mg Oral BID   Continuous Infusions: . sodium chloride 1,000 mL (06/23/17 1411)  . piperacillin-tazobactam (ZOSYN)  IV 3.375 g (06/23/17 1414)  . vancomycin Stopped (06/23/17 1139)     LOS: 2 days    Time spent: 35 min    Kathlen ModyVijaya Nahsir Venezia, MD [facility] Pager [phone]   If 7PM-7AM, please contact night-coverage [email or web address] Password [email or web address] 06/23/2017, 5:44 PM

## 2017-06-23 NOTE — Progress Notes (Signed)
Report given to Consuello ClossKim O.

## 2017-06-24 LAB — CBC WITH DIFFERENTIAL/PLATELET
BASOS ABS: 0 10*3/uL (ref 0.0–0.1)
Basophils Relative: 0 %
EOS PCT: 0 %
Eosinophils Absolute: 0 10*3/uL (ref 0.0–0.7)
HCT: 38.5 % — ABNORMAL LOW (ref 39.0–52.0)
Hemoglobin: 12.9 g/dL — ABNORMAL LOW (ref 13.0–17.0)
LYMPHS PCT: 29 %
Lymphs Abs: 3.9 10*3/uL (ref 0.7–4.0)
MCH: 30.3 pg (ref 26.0–34.0)
MCHC: 33.5 g/dL (ref 30.0–36.0)
MCV: 90.4 fL (ref 78.0–100.0)
Monocytes Absolute: 0.7 10*3/uL (ref 0.1–1.0)
Monocytes Relative: 5 %
NEUTROS ABS: 9 10*3/uL — AB (ref 1.7–7.7)
Neutrophils Relative %: 66 %
PLATELETS: 173 10*3/uL (ref 150–400)
RBC: 4.26 MIL/uL (ref 4.22–5.81)
RDW: 13 % (ref 11.5–15.5)
WBC: 13.6 10*3/uL — AB (ref 4.0–10.5)

## 2017-06-24 MED ORDER — GUAIFENESIN ER 600 MG PO TB12
600.0000 mg | ORAL_TABLET | Freq: Two times a day (BID) | ORAL | 0 refills | Status: AC | PRN
Start: 2017-06-24 — End: 2017-06-29

## 2017-06-24 MED ORDER — OSELTAMIVIR PHOSPHATE 75 MG PO CAPS: 75.0000 mg | ORAL_CAPSULE | Freq: Two times a day (BID) | ORAL | 0 refills | Status: DC

## 2017-06-24 NOTE — Discharge Summary (Signed)
Physician Discharge Summary  CEPHUS TUPY ZOX:[id number] DOB: June 05, 1976 DOA: 06/21/2017  PCP: Patient, No Pcp Per  Admit date: 06/21/2017 Discharge date: 06/24/2017  Admitted From: Home Disposition:  Home Recommendations for Outpatient Follow-up:  1. Follow up with PCP in 1-2 weeks 2. Please obtain CBC in one week   Discharge Condition:Stable CODE STATUS:Full Diet recommendation: Heart Healthy  Brief/Interim Summary:  Phillip Gill a 40 y.o.malewith a known history of HIV, followed by ID and on antiretrovirals, hypertension, fibromyalgia, anxiety and depression, chronic kidney diseasepresents to the emergency department for evaluation of generalized weakness, fevers and chills, nausea, dry cough and muscle achesfor the past 5 days. Patient's flu test came out to be positive.  He was started on Tamiflu.  His respiratory symptoms improved. Patient has been discharged today to home with Tamiflu to complete the course.  Following problems were addressed during his hospitalization:  SIRS: Probably from influenza/ viral syndrome.  Sepsis  ruled out .  Blood cultures are so far negative.  Rapid flu test came out to be positive.Started on tamiflu .  He was started on  IV antibiotics on presentation. This antibiotics has been discontinued. Patient still has mild leukocytosis.  Has remained afebrile.  Will recommend to check white cell counts in a week by doing a CBC test as an outpatient  Hypokalemia: Supplemented and corrected  Hypertension: Currently blood pressure stable.  We will resume his home antihypertensives on discharge.    Discharge Diagnoses:  Active Problems:   Sepsis [facility])    Discharge Instructions  Discharge Instructions    Diet - low sodium heart healthy   Complete by:  As directed    Discharge instructions   Complete by:  As directed    1) Follow up with your PCP in a week. 2) Please take prescribed medication as instructed. 3) Do a CBC  test to check your white cell counts in a week.   Increase activity slowly   Complete by:  As directed      Allergies as of 06/24/2017      Reactions   Bactrim [sulfamethoxazole W/trimethoprim (co-trimoxazole)] Hives, Shortness Of Breath   Cymbalta [duloxetine Hcl] Diarrhea   Other Hives, Swelling   [employer] toothpaste    Zoloft [sertraline Hcl] Diarrhea   Neurontin [gabapentin] Rash      Medication List    TAKE these medications   benzonatate 100 MG capsule Commonly known as:  TESSALON Take 1 capsule (100 mg total) by mouth 3 (three) times daily as needed for cough.   elvitegravir-cobicistat-emtricitabine-tenofovir 150-150-200-10 MG Tabs tablet Commonly known as:  GENVOYA Take 1 tablet by mouth daily with breakfast.   guaiFENesin 600 MG 12 hr tablet Commonly known as:  MUCINEX Take 1 tablet (600 mg total) by mouth 2 (two) times daily as needed for up to 5 days for cough or to loosen phlegm.   imiquimod 5 % cream Commonly known as:  ALDARA APPLY TO TO THE AFFECTED AREA THREE TIMES A WEEK   lisinopril 20 MG tablet Commonly known as:  PRINIVIL,ZESTRIL Take 1 tablet (20 mg total) by mouth daily.   metoprolol succinate 25 MG 24 hr tablet Commonly known as:  TOPROL-XL Take 1 tablet (25 mg total) by mouth daily.   ondansetron 8 MG disintegrating tablet Commonly known as:  ZOFRAN ODT 8mg  ODT q4 hours prn nausea   oseltamivir 75 MG capsule Commonly known as:  TAMIFLU Take 1 capsule (75 mg total) by mouth 2 (two) times daily.   traMADol 50  MG tablet Commonly known as:  ULTRAM Take 1 tablet (50 mg total) by mouth every 6 (six) hours as needed for moderate pain.       Allergies  Allergen Reactions  . Bactrim [Sulfamethoxazole W/Trimethoprim (Co-Trimoxazole)] Hives and Shortness Of Breath  . Cymbalta [Duloxetine Hcl] Diarrhea  . Other Hives and Swelling    [employer] toothpaste   . Zoloft [Sertraline Hcl] Diarrhea  . Neurontin [Gabapentin] Rash     Consultations:  None   Procedures/Studies: Dg Chest 2 View  Result Date: 06/21/2017 CLINICAL DATA:  Fever cold chills EXAM: CHEST  2 VIEW COMPARISON:  06/18/2017, 03/29/ 2017 FINDINGS: Mild central airways thickening. No focal pulmonary opacity or effusion. Normal heart size. No pneumothorax. IMPRESSION: Mild perihilar interstitial opacity could reflect viral process/bronchitis. No focal pneumonia is seen. Electronically Signed   By: Jasmine PangKim  Fujinaga M.D.   On: 06/21/2017 22:07   Dg Chest 2 View  Result Date: 06/18/2017 CLINICAL DATA:  Cough and fever for 2 days. EXAM: CHEST  2 VIEW COMPARISON:  Radiographs 09/28/2015 FINDINGS: The cardiomediastinal contours are normal. The lungs are clear. Pulmonary vasculature is normal. No consolidation, pleural effusion, or pneumothorax. No acute osseous abnormalities are seen. IMPRESSION: No acute pulmonary process. Electronically Signed   By: Rubye OaksMelanie  Ehinger M.D.   On: 06/18/2017 04:56       Subjective: Patient seen and examined the bedside this morning.  No new issues or events .Patient feels comfortable and willing to go home.   Discharge Exam: Vitals:   06/23/17 2043 06/24/17 0444  BP: (!) 141/94 (!) 139/93  Pulse: 69 62  Resp: 16 14  Temp: 98.7 F (37.1 C) 98.9 F (37.2 C)  SpO2: 99% 100%   Vitals:   06/23/17 0503 06/23/17 1453 06/23/17 2043 06/24/17 0444  BP: (!) 141/100 127/90 (!) 141/94 (!) 139/93  Pulse: 82 68 69 62  Resp: 13 15 16 14   Temp: 98.3 F (36.8 C) 98.1 F (36.7 C) 98.7 F (37.1 C) 98.9 F (37.2 C)  TempSrc: Oral Oral Oral Oral  SpO2: 100% 98% 99% 100%  Weight:      Height:        General: Pt is alert, awake, not in acute distress Cardiovascular: RRR, S1/S2 +, no rubs, no gallops Respiratory: CTA bilaterally, no wheezing, no rhonchi Abdominal: Soft, NT, ND, bowel sounds + Extremities: no edema, no cyanosis    The results of significant diagnostics from this hospitalization (including imaging,  microbiology, ancillary and laboratory) are listed below for reference.     Microbiology: Recent Results (from the past 240 hour(s))  Rapid strep screen     Status: None   Collection Time: 06/17/17  3:54 PM  Result Value Ref Range Status   Streptococcus, Group A Screen (Direct) NEGATIVE NEGATIVE Final    Comment: (NOTE) A Rapid Antigen test may result negative if the antigen level in the sample is below the detection level of this test. The FDA has not cleared this test as a stand-alone test therefore the rapid antigen negative result has reflexed to a Group A Strep culture.   Culture, group A strep     Status: None   Collection Time: 06/17/17  3:54 PM  Result Value Ref Range Status   Specimen Description THROAT  Final   Special Requests NONE Reflexed from [id number]  Final   Culture   Final    NO GROUP A STREP (S.PYOGENES) ISOLATED Performed at [facility], [address] [place], [place] [address]  Report Status 06/20/2017 FINAL  Final  Blood Culture (routine x 2)     Status: None (Preliminary result)   Collection Time: 06/21/17  9:35 PM  Result Value Ref Range Status   Specimen Description BLOOD LEFT FOREARM  Final   Special Requests   Final    BOTTLES DRAWN AEROBIC AND ANAEROBIC Blood Culture adequate volume   Culture   Final    NO GROWTH 2 DAYS Performed at [facility], [address] [place], [place] [address]    Report Status PENDING  Incomplete  Blood Culture (routine x 2)     Status: None (Preliminary result)   Collection Time: 06/21/17 10:12 PM  Result Value Ref Range Status   Specimen Description BLOOD RIGHT ANTECUBITAL  Final   Special Requests   Final    BOTTLES DRAWN AEROBIC AND ANAEROBIC Blood Culture adequate volume   Culture   Final    NO GROWTH 2 DAYS Performed at [facility], [address], [place], [place] [address]    Report Status PENDING  Incomplete     Labs: BNP (last 3 results) No results for input(s): BNP in the last  8760 hours. Basic Metabolic Panel: Recent Labs  Lab 06/18/17 0039 06/21/17 2130 06/22/17 0659 06/23/17 1822  NA 136 136 135 139  K 3.4* 3.3* 3.2* 4.0  CL 102 102 102 106  CO2 24 25 24 28   GLUCOSE 94 107* 102* 114*  BUN 14 10 7  5*  CREATININE 1.55* 1.21 1.13 1.14  CALCIUM 8.5* 8.5* 7.6* 8.7*   Liver Function Tests: Recent Labs  Lab 06/18/17 0039 06/21/17 2130 06/22/17 0659  AST 20 25 20   ALT 10* 11* 8*  ALKPHOS 70 55 46  BILITOT 0.7 0.6 0.9  PROT 7.6 8.3* 6.7  ALBUMIN 4.1 3.9 3.1*   Recent Labs  Lab 06/18/17 0039  LIPASE 37   No results for input(s): AMMONIA in the last 168 hours. CBC: Recent Labs  Lab 06/18/17 0039 06/21/17 2130 06/22/17 0659 06/24/17 0859  WBC 9.9 14.1* 14.8* 13.6*  NEUTROABS  --  9.6*  --  9.0*  HGB 13.7 13.7 12.1* 12.9*  HCT 41.5 40.5 36.7* 38.5*  MCV 92.2 91.2 91.3 90.4  PLT 167 132* 121* 173   Cardiac Enzymes: No results for input(s): CKTOTAL, CKMB, CKMBINDEX, TROPONINI in the last 168 hours. BNP: Invalid input(s): POCBNP CBG: No results for input(s): GLUCAP in the last 168 hours. D-Dimer No results for input(s): DDIMER in the last 72 hours. Hgb A1c No results for input(s): HGBA1C in the last 72 hours. Lipid Profile No results for input(s): CHOL, HDL, LDLCALC, TRIG, CHOLHDL, LDLDIRECT in the last 72 hours. Thyroid function studies No results for input(s): TSH, T4TOTAL, T3FREE, THYROIDAB in the last 72 hours.  Invalid input(s): FREET3 Anemia work up No results for input(s): VITAMINB12, FOLATE, FERRITIN, TIBC, IRON, RETICCTPCT in the last 72 hours. Urinalysis    Component Value Date/Time   COLORURINE YELLOW 06/21/2017 2139   APPEARANCEUR HAZY (A) 06/21/2017 2139   LABSPEC 1.029 06/21/2017 2139   PHURINE 5.0 06/21/2017 2139   GLUCOSEU NEGATIVE 06/21/2017 2139   HGBUR NEGATIVE 06/21/2017 2139   BILIRUBINUR NEGATIVE 06/21/2017 2139   KETONESUR 5 (A) 06/21/2017 2139   PROTEINUR 100 (A) 06/21/2017 2139   UROBILINOGEN 0.2  03/08/2015 0228   NITRITE NEGATIVE 06/21/2017 2139   LEUKOCYTESUR NEGATIVE 06/21/2017 2139   Sepsis Labs Invalid input(s): PROCALCITONIN,  WBC,  LACTICIDVEN Microbiology Recent Results (from the past 240  hour(s))  Rapid strep screen     Status: None   Collection Time: 06/17/17  3:54 PM  Result Value Ref Range Status   Streptococcus, Group A Screen (Direct) NEGATIVE NEGATIVE Final    Comment: (NOTE) A Rapid Antigen test may result negative if the antigen level in the sample is below the detection level of this test. The FDA has not cleared this test as a stand-alone test therefore the rapid antigen negative result has reflexed to a Group A Strep culture.   Culture, group A strep     Status: None   Collection Time: 06/17/17  3:54 PM  Result Value Ref Range Status   Specimen Description THROAT  Final   Special Requests NONE Reflexed from [id number]  Final   Culture   Final    NO GROUP A STREP (S.PYOGENES) ISOLATED Performed at [facility], [address] [place], [place] [address]    Report Status 06/20/2017 FINAL  Final  Blood Culture (routine x 2)     Status: None (Preliminary result)   Collection Time: 06/21/17  9:35 PM  Result Value Ref Range Status   Specimen Description BLOOD LEFT FOREARM  Final   Special Requests   Final    BOTTLES DRAWN AEROBIC AND ANAEROBIC Blood Culture adequate volume   Culture   Final    NO GROWTH 2 DAYS Performed at [facility], [address] [place], [place] [address]    Report Status PENDING  Incomplete  Blood Culture (routine x 2)     Status: None (Preliminary result)   Collection Time: 06/21/17 10:12 PM  Result Value Ref Range Status   Specimen Description BLOOD RIGHT ANTECUBITAL  Final   Special Requests   Final    BOTTLES DRAWN AEROBIC AND ANAEROBIC Blood Culture adequate volume   Culture   Final    NO GROWTH 2 DAYS Performed at [facility], [address] [place], [place] [address]    Report Status PENDING   Incomplete     Time coordinating discharge: Over 30 minutes  SIGNED:   Meredith LeedsAmrit Guenevere Roorda BK, MD  [facility] 06/24/2017, 12:39 PM Pager [phone]  If 7PM-7AM, please contact night-coverage [email or web address] Password [email or web address]

## 2017-06-24 NOTE — Care Management Note (Signed)
Case Management Note  Patient Details  Name: Marilynne HalstedShanna M Pepin MRN: [id number] Date of Birth: Jan 17, 1976  Subjective/Objective:                  Sepsis and bronchitis  Action/Plan: Date:  June 24, 2017 Chart reviewed for concurrent status and case management needs.  Will continue to follow patient progress.  Discharge Planning: following for needs  Expected discharge date: December 247 2018  Marcelle SmilingRhonda Cortney Mckinney, BSN, [place]3, [place]   [phone]   Expected Discharge Date:                  Expected Discharge Plan:  Home/Self Care  In-House Referral:     Discharge planning Services  CM Consult  Post Acute Care Choice:    Choice offered to:  Patient  DME Arranged:    DME Agency:     HH Arranged:    HH Agency:     Status of Service:  In process, will continue to follow  If discussed at Long Length of Stay Meetings, dates discussed:    Additional Comments:  Golda AcreDavis, [place] Depascale Lynn, RN 06/24/2017, 9:54 AM

## 2017-06-27 LAB — CULTURE, BLOOD (ROUTINE X 2)
Culture: NO GROWTH
Culture: NO GROWTH
Special Requests: ADEQUATE
Special Requests: ADEQUATE

## 2017-07-09 ENCOUNTER — Other Ambulatory Visit: Payer: Self-pay | Admitting: *Deleted

## 2017-07-09 DIAGNOSIS — Z79899 Other long term (current) drug therapy: Secondary | ICD-10-CM

## 2017-07-17 ENCOUNTER — Other Ambulatory Visit: Payer: PRIVATE HEALTH INSURANCE

## 2017-07-17 ENCOUNTER — Other Ambulatory Visit (HOSPITAL_COMMUNITY)
Admission: RE | Admit: 2017-07-17 | Discharge: 2017-07-17 | Disposition: A | Discharge status: Home / Self Care | Payer: PRIVATE HEALTH INSURANCE | Source: Ambulatory Visit | Attending: Infectious Disease | Admitting: Infectious Disease

## 2017-07-17 DIAGNOSIS — B20 Human immunodeficiency virus [HIV] disease: Secondary | ICD-10-CM

## 2017-07-17 DIAGNOSIS — Z79899 Other long term (current) drug therapy: Secondary | ICD-10-CM

## 2017-07-18 LAB — COMPLETE METABOLIC PANEL WITH GFR
AG RATIO: 1.3 (calc) (ref 1.0–2.5)
ALBUMIN MSPROF: 4.2 g/dL (ref 3.6–5.1)
ALKALINE PHOSPHATASE (APISO): 61 U/L (ref 40–115)
ALT: 8 U/L — ABNORMAL LOW (ref 9–46)
AST: 17 U/L (ref 10–40)
BILIRUBIN TOTAL: 0.3 mg/dL (ref 0.2–1.2)
BUN: 13 mg/dL (ref 7–25)
CHLORIDE: 103 mmol/L (ref 98–110)
CO2: 27 mmol/L (ref 20–32)
Calcium: 9.2 mg/dL (ref 8.6–10.3)
Creat: 1.28 mg/dL (ref 0.60–1.35)
GFR, Est African American: 80 mL/min/{1.73_m2} (ref >=60)
GFR, Est Non African American: 69 mL/min/{1.73_m2} (ref >=60)
GLOBULIN: 3.2 g/dL (ref 1.9–3.7)
GLUCOSE: 96 mg/dL (ref 65–99)
POTASSIUM: 3.9 mmol/L (ref 3.5–5.3)
SODIUM: 139 mmol/L (ref 135–146)
Total Protein: 7.4 g/dL (ref 6.1–8.1)

## 2017-07-18 LAB — CBC WITH DIFFERENTIAL/PLATELET
BASOS ABS: 40 {cells}/uL (ref 0–200)
BASOS PCT: 0.4 %
EOS ABS: 202 {cells}/uL (ref 15–500)
EOS PCT: 2 %
HEMATOCRIT: 39.9 % (ref 38.5–50.0)
Hemoglobin: 13.4 g/dL (ref 13.2–17.1)
LYMPHS ABS: 3838 {cells}/uL (ref 850–3900)
MCH: 29.3 pg (ref 27.0–33.0)
MCHC: 33.6 g/dL (ref 32.0–36.0)
MCV: 87.1 fL (ref 80.0–100.0)
MONOS PCT: 8 %
MPV: 10.6 fL (ref 7.5–12.5)
NEUTROS ABS: 5212 {cells}/uL (ref 1500–7800)
Neutrophils Relative %: 51.6 %
Platelets: 218 10*3/uL (ref 140–400)
RBC: 4.58 10*6/uL (ref 4.20–5.80)
RDW: 13 % (ref 11.0–15.0)
Total Lymphocyte: 38 %
WBC mixed population: 808 cells/uL (ref 200–950)
WBC: 10.1 10*3/uL (ref 3.8–10.8)

## 2017-07-18 LAB — LIPID PANEL
Cholesterol: 156 mg/dL (ref <200)
HDL: 35 mg/dL — AB (ref >40)
LDL CHOLESTEROL (CALC): 98 mg/dL
Non-HDL Cholesterol (Calc): 121 mg/dL (calc) (ref <130)
TRIGLYCERIDES: 135 mg/dL (ref <150)
Total CHOL/HDL Ratio: 4.5 (calc) (ref <5.0)

## 2017-07-18 LAB — URINE CYTOLOGY ANCILLARY ONLY
Chlamydia: NEGATIVE
Neisseria Gonorrhea: NEGATIVE

## 2017-07-18 LAB — T-HELPER CELL (CD4) - (RCID CLINIC ONLY)
CD4 % Helper T Cell: 35 % (ref 33–55)
CD4 T CELL ABS: 1600 /uL (ref 400–2700)

## 2017-07-19 LAB — HIV-1 RNA QUANT-NO REFLEX-BLD
HIV 1 RNA QUANT: 28 {copies}/mL — AB
HIV-1 RNA Quant, Log: 1.45 Log copies/mL — ABNORMAL HIGH

## 2017-07-31 ENCOUNTER — Ambulatory Visit (INDEPENDENT_AMBULATORY_CARE_PROVIDER_SITE_OTHER): Payer: PRIVATE HEALTH INSURANCE | Admitting: Infectious Disease

## 2017-07-31 ENCOUNTER — Encounter: Payer: Self-pay | Admitting: Infectious Disease

## 2017-07-31 VITALS — BP 150/93 | HR 63 | Temp 97.9°F | Ht 72.0 in | Wt 186.0 lb

## 2017-07-31 DIAGNOSIS — Z79899 Other long term (current) drug therapy: Secondary | ICD-10-CM

## 2017-07-31 DIAGNOSIS — A55 Chlamydial lymphogranuloma (venereum): Secondary | ICD-10-CM

## 2017-07-31 DIAGNOSIS — I10 Essential (primary) hypertension: Secondary | ICD-10-CM

## 2017-07-31 DIAGNOSIS — B20 Human immunodeficiency virus [HIV] disease: Secondary | ICD-10-CM | POA: Diagnosis not present

## 2017-07-31 DIAGNOSIS — B181 Chronic viral hepatitis B without delta-agent: Secondary | ICD-10-CM | POA: Diagnosis not present

## 2017-07-31 MED ORDER — ELVITEG-COBIC-EMTRICIT-TENOFAF 150-150-200-10 MG PO TABS: 1.0000 | ORAL_TABLET | Freq: Every day | ORAL | 11 refills | Status: DC

## 2017-07-31 MED ORDER — AMLODIPINE BESY-BENAZEPRIL HCL 10-20 MG PO CAPS: 1.0000 | ORAL_CAPSULE | Freq: Every day | ORAL | 11 refills | Status: DC

## 2017-07-31 NOTE — Progress Notes (Signed)
[facility] Pharmacy Visit  HPI: Phillip Gill is a 42 y.o. male who presents to the [facility] pharmacy clinic for HIV follow-up.  Patient Active Problem List   Diagnosis Date Noted  . Sepsis (HCC) 06/21/2017  . Flu-like symptoms   . Overweight 07/20/2015  . Colitis 03/08/2015  . Diarrhea   . HIV (human immunodeficiency virus infection) (HCC)   . Tobacco abuse   . HIV disease (HCC) 02/28/2015  . Leukocytosis   . LGV (lymphogranuloma venereum)   . Primary syphilis   . Chronic hepatitis B (HCC) 10/03/2014  . Inguinal adenopathy 10/03/2014  . Human immunodeficiency virus (HIV) disease (HCC)   . HTN (hypertension)   . Gynecomastia 04/30/2014  . 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 Chronic pain associated with significant psychosocial dysfunction 09/18/2012  . HBV (hepatitis B virus) infection 09/18/2012  . Tobacco dependence 06/03/2012  . Fibromyalgia muscle pain 02/11/2012  . Depression 12/07/2011  . Anxiety 02/02/2011    Patient's Medications  New Prescriptions   AMLODIPINE-BENAZEPRIL (LOTREL) 10-20 MG CAPSULE    Take 1 capsule by mouth daily.  Previous Medications   BENZONATATE (TESSALON) 100 MG CAPSULE    Take 1 capsule (100 mg total) by mouth 3 (three) times daily as needed for cough.   IMIQUIMOD (ALDARA) 5 % CREAM    APPLY TO TO THE AFFECTED AREA THREE TIMES A WEEK   LISINOPRIL (PRINIVIL,ZESTRIL) 20 MG TABLET    Take 1 tablet (20 mg total) by mouth daily.   METOPROLOL SUCCINATE (TOPROL-XL) 25 MG 24 HR TABLET    Take 1 tablet (25 mg total) by mouth daily.   ONDANSETRON (ZOFRAN ODT) 8 MG DISINTEGRATING TABLET    8mg  ODT q4 hours prn nausea   OSELTAMIVIR (TAMIFLU) 75 MG CAPSULE    Take 1 capsule (75 mg total) by mouth 2 (two) times daily.   TRAMADOL (ULTRAM) 50 MG TABLET    Take 1 tablet (50 mg total) by mouth every 6 (six) hours as needed for moderate pain.  Modified Medications   Modified Medication Previous Medication   ELVITEGRAVIR-COBICISTAT-EMTRICITABINE-TENOFOVIR (GENVOYA) 150-150-200-10 MG TABS TABLET elvitegravir-cobicistat-emtricitabine-tenofovir (GENVOYA) 150-150-200-10 MG TABS tablet      Take 1 tablet by mouth daily with breakfast.    Take 1 tablet by mouth daily with breakfast.  Discontinued Medications   No medications on file    Allergies: Allergies  Allergen Reactions  . Bactrim [Sulfamethoxazole W/Trimethoprim (Co-Trimoxazole)] Hives and Shortness Of Breath  . Cymbalta [Duloxetine Hcl] Diarrhea  . Other Hives and Swelling    Colgate toothpaste   . Zoloft [Sertraline Hcl] Diarrhea  . Neurontin [Gabapentin] Rash    Past Medical History: Past Medical History:  Diagnosis Date  . Anxiety   . Arthritis    "neck" (02/16/2015)  . 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 Chronic lower back pain   . Depression   . Fibromyalgia   . Genital warts   . HIV disease (HCC) 02/28/2015  . HIV infection (HCC)    followed by Dr. Luciana Axe- sees him every 4 months  . Hypertension   . IBS (irritable bowel syndrome)   . Migraine    "none in years" (02/16/2015  . Overweight 07/20/2015  . Renal insufficiency 12/29/2013    Social History: Social History   Socioeconomic History  . Marital status: Single    Spouse name: Not on file  . Number of children: Not on file  . Years of education: Not on file  .  Highest education level: Not on file  Social Needs  . Financial resource strain: Not on file  . Food insecurity - worry: Not on file  . Food insecurity - inability: Not on file  . Transportation needs - medical: Not on file  . Transportation needs - non-medical: Not on file  Occupational History  . Not on file  Tobacco Use  . Smoking status: Current Every Day Smoker    Packs/day: 0.25    Years: 18.00    Pack years: 4.50    Types: Cigarettes  . Smokeless tobacco: Never Used  Substance and Sexual Activity  . Alcohol use: Yes    Comment: "Once in a blue moon"  . Drug use: No  .  Sexual activity: Not on file  Other Topics Concern  . Not on file  Social History Narrative   [place] in [place], now living in [place]  Working [employer]- works 3rd shift.    Finished HS.   Doing online classes with [employer]- studying accounting   Previously 2 years of classes at [employer].    Has Partner- Phillip Gill- together for 1 year.    Has 2 girls- (born in 2000).           Labs: HIV 1 RNA Quant (copies/mL)  Date Value  07/17/2017 28 (H)  04/15/2017 21 (H)  07/06/2015 <20   CD4 T Cell Abs (/uL)  Date Value  07/17/2017 1,600  04/15/2017 1,640  07/06/2015 1,460   Hep B S Ab (no units)  Date Value  01/30/2011 NEG   Hepatitis B Surface Ag (no units)  Date Value  10/05/2014 POSITIVE (A)   HCV Ab (no units)  Date Value  10/05/2014 NEGATIVE    Lipids:    Component Value Date/Time   CHOL 156 07/17/2017 1002   TRIG 135 07/17/2017 1002   HDL 35 (L) 07/17/2017 1002   CHOLHDL 4.5 07/17/2017 1002   VLDL 16 02/14/2015 1220   LDLCALC 79 02/14/2015 1220    Current HIV Regimen: Genvoya  Assessment: Phillip DoughtyShanna is here today to follow-up with Dr. Daiva EvesVan Dam for his HIV infection.  He is doing well on Genvoya but ran out of medication yesterday.  I was able to help him get a month supply today.  He did have ADAP but now has insurance which requires he fill at their specialty pharmacy in [place].  I sent his prescription over to their pharmacy and activated a copay card for him and told him to give them the information when they call him. [facility].  Plan: - Continue Genvoya PO once daily - Amlodipine-benazepril 10/20 mg PO once daily  [name] L. Christabella Alvira, PharmD, AAHIVP, CPP Infectious Diseases Clinical Pharmacist [facility] 07/31/2017, 11:57 AM

## 2017-07-31 NOTE — Progress Notes (Signed)
Subjective:    Chief complaint: followup for HIV on medications, [place] and now back to [place]   Patient ID: Phillip HalstedShanna M Gill, male    DOB: [birth year], 42 y.o.   MRN: [id number]   42 year old [place] man who have followed for many years but who [place] [facility] [place] [facility] and [facility]. He has moved back to [place]. ADAP is renewed though now he has insurance.  Very nice virological control  Lab Results  Component Value Date   HIV1RNAQUANT 28 (H) 07/17/2017   HIV1RNAQUANT 21 (H) 04/15/2017   HIV1RNAQUANT <20 07/06/2015   Lab Results  Component Value Date   CD4TABS 1,600 07/17/2017   CD4TABS 1,640 04/15/2017   CD4TABS 1,460 07/06/2015   He is not feeling his blood pressure medicines because the co-pays are high on this when he has been feeling them at [facility].   Past Medical History:  Diagnosis Date  . Anxiety   . Arthritis    "neck" (02/16/2015)  . 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 Chronic lower back pain   . Depression   . Fibromyalgia   . Genital warts   . HIV disease (HCC) 02/28/2015  . HIV infection (HCC)    followed by Dr. Luciana Axeomer- sees him every 4 months  . Hypertension   . IBS (irritable bowel syndrome)   . Migraine    "none in years" (02/16/2015  . Overweight 07/20/2015  . Renal insufficiency 12/29/2013    Past Surgical History:  Procedure Laterality Date  . CO2 LASER APPLICATION N/A 02/11/2013   Procedure: CO2 LASER APPLICATION;  Surgeon: Romie LeveeAlicia Thomas, MD;  [facility] [place];  Service: General;  Laterality: N/A;  . HIGH RESOLUTION ANOSCOPY N/A 02/11/2013   Procedure: HIGH RESOLUTION ANOSCOPY WITH BIOPSY, LASER ABLATION;  Surgeon: Romie LeveeAlicia Thomas, MD;  [facility] [place];  Service: General;  Laterality: N/A;  . WISDOM TOOTH EXTRACTION      Family History  Problem Relation Age of Onset  . Hypertension Mother   . Diabetes Mother   . Stroke Mother    cerbral aneurysm  . Hypertension Brother   . Mental illness Neg Hx       Social History   Socioeconomic History  . Marital status: Single    Spouse name: Not on file  . Number of children: Not on file  . Years of education: Not on file  . Highest education level: Not on file  Social Needs  . Financial resource strain: Not on file  . Food insecurity - worry: Not on file  . Food insecurity - inability: Not on file  . Transportation needs - medical: Not on file  . Transportation needs - non-medical: Not on file  Occupational History  . Not on file  Tobacco Use  . Smoking status: Current Every Day Smoker    Packs/day: 0.25    Years: 18.00    Pack years: 4.50    Types: Cigarettes  . Smokeless tobacco: Never Used  Substance and Sexual Activity  . Alcohol use: Yes    Comment: "Once in a blue moon"  . Drug use: No  . Sexual activity: Not on file  Other Topics Concern  . Not on file  Social History Narrative   Grew up in [place], now living in [place],-  Working [employer]- works 3rd shift.    Finished HS.   Doing online classes with  [employer]- studying accounting   Previously 2 years of classes at [employer].    Has Partner- [name]- together for 1 year.    Has 2 girls- (born in 2000).           Allergies  Allergen Reactions  . Bactrim [Sulfamethoxazole W/Trimethoprim (Co-Trimoxazole)] Hives and Shortness Of Breath  . Cymbalta [Duloxetine Hcl] Diarrhea  . Other Hives and Swelling    [employer]gate toothpaste   . Zoloft [Sertraline Hcl] Diarrhea  . Neurontin [Gabapentin] Rash     Current Outpatient Medications:  .  elvitegravir-cobicistat-emtricitabine-tenofovir (GENVOYA) 150-150-200-10 MG TABS tablet, Take 1 tablet by mouth daily with breakfast., Disp: 30 tablet, Rfl: 11 .  imiquimod (ALDARA) 5 % cream, APPLY TO TO THE AFFECTED AREA THREE TIMES A WEEK, Disp: 12 each, Rfl: 0 .  amLODipine-benazepril (LOTREL) 10-20 MG capsule, Take 1 capsule by mouth  daily., Disp: 30 capsule, Rfl: 11 .  benzonatate (TESSALON) 100 MG capsule, Take 1 capsule (100 mg total) by mouth 3 (three) times daily as needed for cough., Disp: 60 capsule, Rfl: 0 .  lisinopril (PRINIVIL,ZESTRIL) 20 MG tablet, Take 1 tablet (20 mg total) by mouth daily. (Patient not taking: Reported on 04/29/2017), Disp: 30 tablet, Rfl: 11 .  metoprolol succinate (TOPROL-XL) 25 MG 24 hr tablet, Take 1 tablet (25 mg total) by mouth daily. (Patient not taking: Reported on 06/18/2017), Disp: 30 tablet, Rfl: 11 .  ondansetron (ZOFRAN ODT) 8 MG disintegrating tablet, 8mg  ODT q4 hours prn nausea (Patient not taking: Reported on 07/31/2017), Disp: 4 tablet, Rfl: 0 .  oseltamivir (TAMIFLU) 75 MG capsule, Take 1 capsule (75 mg total) by mouth 2 (two) times daily. (Patient not taking: Reported on 07/31/2017), Disp: 5 capsule, Rfl: 0 .  traMADol (ULTRAM) 50 MG tablet, Take 1 tablet (50 mg total) by mouth every 6 (six) hours as needed for moderate pain. (Patient not taking: Reported on 04/29/2017), Disp: 12 tablet, Rfl: 0   Review of Systems  Constitutional: Negative for activity change, appetite change, chills, diaphoresis, fatigue and unexpected weight change.  HENT: Negative for congestion, rhinorrhea, sinus pressure, sneezing, sore throat and trouble swallowing.   Eyes: Negative for photophobia and visual disturbance.  Respiratory: Negative for cough, chest tightness, shortness of breath, wheezing and stridor.   Cardiovascular: Negative for chest pain, palpitations and leg swelling.  Gastrointestinal: Negative for abdominal distention, abdominal pain, anal bleeding, blood in stool, constipation, diarrhea, nausea and vomiting.  Genitourinary: Negative for difficulty urinating, dysuria, flank pain and hematuria.  Musculoskeletal: Negative for arthralgias, back pain, gait problem, joint swelling and myalgias.  Skin: Negative for color change, pallor, rash and wound.  Neurological: Negative for dizziness,  tremors, weakness and light-headedness.  Hematological: Negative for adenopathy. Does not bruise/bleed easily.  Psychiatric/Behavioral: Negative for agitation, behavioral problems, confusion, decreased concentration, dysphoric mood and sleep disturbance.       Objective:   Physical Exam  Constitutional: He is oriented to person, place, and time. He appears well-developed and well-nourished.  HENT:  Head: Normocephalic and atraumatic.  Mouth/Throat: Uvula is midline and oropharynx is clear and moist. No oropharyngeal exudate or posterior oropharyngeal edema.  Eyes: Conjunctivae and EOM are normal.  Neck: Normal range of motion. Neck supple.  Cardiovascular: Normal rate and regular rhythm.  Pulmonary/Chest: Effort normal and breath sounds normal. No respiratory distress. He has no wheezes. He exhibits no tenderness.  Abdominal: Soft. Bowel sounds are normal. He exhibits no distension. There is no tenderness.  Musculoskeletal: Normal range of  motion. He exhibits no edema or tenderness.  Neurological: He is alert and oriented to person, place, and time.  Skin: Skin is warm and dry. No rash noted. No erythema. No pallor.  Psychiatric: He has a normal mood and affect. His behavior is normal. Judgment and thought content normal.  Nursing note and vitals reviewed.         Assessment & Plan:   HIV: continue  Genvoya.  His new insurance is forcing him to go through a mail order pharmacy and we cannot use [facility] so we will send prescription through his designated mail order pharmacy and activate the [employer] co-pay assistance card  LGV: sp treatment, GC and chlamydia : Recheck for GC and chlamydia in the oropharynx and rectum  Syphilis: follow titers NON-REACTIVE (10/15 1148)   Chronic hep B : I have ordered an ultrasound of the right upper quadrant   HTN: Not as well controlled now that he is off medications Kasie worked to find out what medications he was on before and come up with a  regimen that is affordable and that can be filled locally for him.  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  Need to make sure he is being followed up for this either with Dr. Maisie Fus now that he has insurance again or he can also be seen by Dr. Ninetta Lights for HRA  I spent greater than 25 minutes with the patient including greater than 50% of time in face to face counsel of the patient guarding his excellent adherence to his regimen reviewing his laboratory data and trying to solve the issue of his need for either antihypertensive medications including and in coordination of his care.

## 2017-08-01 LAB — CYTOLOGY, (ORAL, ANAL, URETHRAL) ANCILLARY ONLY
Chlamydia: NEGATIVE
Chlamydia: NEGATIVE
NEISSERIA GONORRHEA: NEGATIVE
Neisseria Gonorrhea: NEGATIVE

## 2017-09-29 ENCOUNTER — Encounter (HOSPITAL_COMMUNITY): Payer: Self-pay | Admitting: Emergency Medicine

## 2017-09-29 ENCOUNTER — Emergency Department (HOSPITAL_COMMUNITY)
Admission: EM | Admit: 2017-09-29 | Discharge: 2017-09-29 | Disposition: A | Discharge status: Home / Self Care | Payer: Worker's Compensation | Attending: Emergency Medicine | Admitting: Emergency Medicine

## 2017-09-29 DIAGNOSIS — M5416 Radiculopathy, lumbar region: Secondary | ICD-10-CM

## 2017-09-29 DIAGNOSIS — I10 Essential (primary) hypertension: Secondary | ICD-10-CM | POA: Diagnosis not present

## 2017-09-29 DIAGNOSIS — Z21 Asymptomatic human immunodeficiency virus [HIV] infection status: Secondary | ICD-10-CM | POA: Diagnosis not present

## 2017-09-29 DIAGNOSIS — Z79899 Other long term (current) drug therapy: Secondary | ICD-10-CM | POA: Diagnosis not present

## 2017-09-29 DIAGNOSIS — F17210 Nicotine dependence, cigarettes, uncomplicated: Secondary | ICD-10-CM | POA: Diagnosis not present

## 2017-09-29 DIAGNOSIS — M79604 Pain in right leg: Secondary | ICD-10-CM | POA: Diagnosis present

## 2017-09-29 MED ORDER — PREDNISONE 20 MG PO TABS: 20.0000 mg | ORAL_TABLET | Freq: Two times a day (BID) | ORAL | 0 refills | Status: DC

## 2017-09-29 MED ORDER — AMLODIPINE BESY-BENAZEPRIL HCL 10-20 MG PO CAPS: 1.0000 | ORAL_CAPSULE | Freq: Every day | ORAL | 1 refills | Status: DC

## 2017-09-29 NOTE — ED Provider Notes (Signed)
[facility]-EMERGENCY DEPT Provider Note   CSN: [id number] Arrival date & time: 09/29/17  8413     History   Chief Complaint Chief Complaint  Patient presents with  . Leg Pain    HPI Phillip Gill is a 42 y.o. male.  He presents for evaluation of right leg pain which started yesterday while he was helping a patient change his underwear.  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  No current low back pain.  History of prior back injury about 2 years ago at which time he was told that he had an L5-S1 disc problem.  He was treated by emergency services provider at that time, and did not require specialty care.  No chronic leg or back pain.  No change in bowel or urine since the injury yesterday.  He denies fever, chills, nausea, vomiting, weakness or dizziness.  There are no other known modifying factors.  HPI  Past Medical History:  Diagnosis Date  . Anxiety   . Arthritis    "neck" (02/16/2015)  . 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 Chronic lower back pain   . Depression   . Fibromyalgia   . Genital warts   . HIV disease (HCC) 02/28/2015  . HIV infection (HCC)    followed by Dr. Luciana Axe- sees him every 4 months  . Hypertension   . IBS (irritable bowel syndrome)   . Migraine    "none in years" (02/16/2015  . Overweight 07/20/2015  . Renal insufficiency 12/29/2013    Patient Active Problem List   Diagnosis Date Noted  . Sepsis (HCC) 06/21/2017  . Flu-like symptoms   . Overweight 07/20/2015  . Colitis 03/08/2015  . Diarrhea   . HIV (Gill immunodeficiency virus infection) (HCC)   . Tobacco abuse   . HIV disease (HCC) 02/28/2015  . Leukocytosis   . LGV (lymphogranuloma venereum)   . Primary syphilis   . Chronic hepatitis B (HCC) 10/03/2014  . Inguinal adenopathy 10/03/2014  . Gill immunodeficiency virus (HIV) disease (HCC)   . HTN (hypertension)   . Gynecomastia  04/30/2014  . 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 Chronic pain associated with significant psychosocial dysfunction 09/18/2012  . HBV (hepatitis B virus) infection 09/18/2012  . Tobacco dependence 06/03/2012  . Fibromyalgia muscle pain 02/11/2012  . Depression 12/07/2011  . Anxiety 02/02/2011    Past Surgical History:  Procedure Laterality Date  . CO2 LASER APPLICATION N/A 02/11/2013   Procedure: CO2 LASER APPLICATION;  Surgeon: Romie Levee, MD;  [facility];  Service: General;  Laterality: N/A;  . HIGH RESOLUTION ANOSCOPY N/A 02/11/2013   Procedure: HIGH RESOLUTION ANOSCOPY WITH BIOPSY, LASER ABLATION;  Surgeon: Romie Levee, MD;  Location:  S[facility];  Service: General;  Laterality: N/A;  . WISDOM TOOTH EXTRACTION          Home Medications    Prior to Admission medications   Medication Sig Start Date End Date Taking? Authorizing Provider  amLODipine-benazepril (LOTREL) 10-20 MG capsule Take 1 capsule by mouth daily. 09/29/17   Mancel Bale, MD  benzonatate (TESSALON) 100 MG capsule Take 1 capsule (100 mg total) by mouth 3 (three) times daily as needed for cough. 06/17/17   Wallis Bamberg, PA-C  elvitegravir-cobicistat-emtricitabine-tenofovir (GENVOYA) 150-150-200-10 MG TABS tablet Take 1 tablet by mouth daily with breakfast. 07/31/17   Daiva Eves, Lisette Grinder, MD  imiquimod [facility] Dad)  5 % cream APPLY TO TO THE AFFECTED AREA THREE TIMES A WEEK 10/12/15   Daiva Eves, Lisette Grinder, MD  lisinopril (PRINIVIL,ZESTRIL) 20 MG tablet Take 1 tablet (20 mg total) by mouth daily. Patient not taking: Reported on 04/29/2017 07/20/15   Daiva Eves, Lisette Grinder, MD  metoprolol succinate (TOPROL-XL) 25 MG 24 hr tablet Take 1 tablet (25 mg total) by mouth daily. Patient not taking: Reported on 06/18/2017 07/20/15   Daiva Eves, Lisette Grinder, MD  ondansetron [facility] ODT) 8 MG disintegrating tablet 8mg  ODT q4 hours prn nausea Patient not taking: Reported on 07/31/2017 06/18/17   Zadie Rhine, MD  oseltamivir (TAMIFLU) 75 MG capsule Take 1 capsule (75 mg total) by mouth 2 (two) times daily. Patient not taking: Reported on 07/31/2017 06/24/17   Burnadette Pop, MD  predniSONE (DELTASONE) 20 MG tablet Take 1 tablet (20 mg total) by mouth 2 (two) times daily. 09/29/17   Mancel Bale, MD  traMADol (ULTRAM) 50 MG tablet Take 1 tablet (50 mg total) by mouth every 6 (six) hours as needed for moderate pain. Patient not taking: Reported on 04/29/2017 12/16/15   Joni Reining, PA-C  gabapentin (NEURONTIN) 300 MG capsule Take 1 capsule (300 mg total) by mouth 3 (three) times daily. 07/25/12 08/02/12  Garnetta Buddy, MD    Family History Family History  Problem Relation Age of Onset  . Hypertension Mother   . Diabetes Mother   . Stroke Mother        cerbral aneurysm  . Hypertension Brother   . Mental illness Neg Hx     Social History Social History   Tobacco Use  . Smoking status: Current Every Day Smoker    Packs/day: 0.25    Years: 18.00    Pack years: 4.50    Types: Cigarettes  . Smokeless tobacco: Never Used  Substance Use Topics  . Alcohol use: Yes    Comment: "Once in a blue moon"  . Drug use: No     Allergies   Bactrim [sulfamethoxazole w/trimethoprim (co-trimoxazole)]; Cymbalta [duloxetine hcl]; Other; Zoloft [sertraline hcl]; and Neurontin [gabapentin]   Review of Systems Review of Systems  All other systems reviewed and are negative.    Physical Exam Updated Vital Signs BP (!) 140/94   Pulse 96   Temp 98.5 F (36.9 C) (Oral)   Resp 18   SpO2 97%   Physical Exam  Constitutional: He is oriented to person, place, and time. He appears well-developed and well-nourished.  HENT:  Head: Normocephalic and atraumatic.  Right Ear: External ear normal.  Left Ear: External ear normal.  Eyes: Pupils are equal, round, and reactive to light. Conjunctivae and EOM are normal.  Neck: Normal range of motion and phonation normal. Neck supple.    Cardiovascular: Normal rate, regular rhythm and normal heart sounds.  Pulmonary/Chest: Effort normal and breath sounds normal. He exhibits no bony tenderness.  Musculoskeletal: Normal range of motion.  Normal range of motion back and legs.  Neurological: He is alert and oriented to person, place, and time. No cranial nerve deficit or sensory deficit. He exhibits normal muscle tone. Coordination normal.  Skin: Skin is warm, dry and intact.  Psychiatric: He has a normal mood and affect. His behavior is normal. Judgment and thought content normal.  Nursing note and vitals reviewed.    ED Treatments / Results  Labs (all labs ordered are listed, but only abnormal results are displayed) Labs Reviewed - No data to display  EKG None  Radiology No results found.  Procedures Procedures (including critical care time)  Medications Ordered in ED Medications - No data to display   Initial Impression / Assessment and Plan / ED Course  I have reviewed the triage vital signs and the nursing notes.  Pertinent labs & imaging results that were available during my care of the patient were reviewed by me and considered in my medical decision making (see chart for details).  Clinical Course as of Sep 29 908  Sun Sep 29, 2017  0801 Elevated blood pressure  BP(!): 167/109 [EW]  0850 Pressure improved.  Patient states he is out of his Norvasc.  BP(!): 140/94 [EW]    Clinical Course User Index [EW] Mancel BaleWentz, Lyndell Gillyard, MD     Patient Vitals for the past 24 hrs:  BP Temp Temp src Pulse Resp SpO2  09/29/17 0810 (!) 140/94 - - - - -  09/29/17 0654 (!) 167/109 98.5 F (36.9 C) Oral 96 18 97 %    9:10 AM Reevaluation with update and discussion. After initial assessment and treatment, an updated evaluation reveals he is comfortable at this time and has no further complaints.  Findings discussed and questions answered. Mancel BaleElliott Myrl Lazarus   Medical decision making-symptoms consistent with lumbar  radiculopathy, with known prior degenerative lumbar disc disease.  No specific recent trauma.  Suspect overuse component to onset of symptoms.  Incidental hypertension associated with being out of medication.  Doubt lumbar myelopathy, fracture, discitis or serious bacterial infection.  Nursing Notes Reviewed/ Care Coordinated Applicable Imaging Reviewed Interpretation of Laboratory Data incorporated into ED treatment  The patient appears reasonably screened and/or stabilized for discharge and I doubt any other medical condition or other [facility] requiring further screening, evaluation, or treatment in the ED at this time prior to discharge.  Plan: Home Medications-continue current medications, refill for Norvasc given; Home Treatments-work release for 3 days, rest, heat applications; return here if the recommended treatment, does not improve the symptoms; Recommended follow up-neurosurgery follow-up for lumbar radiculopathy.      Final Clinical Impressions(s) / ED Diagnoses   Final diagnoses:  Lumbar radiculopathy  Hypertension, unspecified type    ED Discharge Orders        Ordered    predniSONE (DELTASONE) 20 MG tablet  2 times daily     09/29/17 0908    amLODipine-benazepril (LOTREL) 10-20 MG capsule  Daily     09/29/17 0909       Mancel BaleWentz, Torra Pala, MD 09/29/17 [phone]

## 2017-09-29 NOTE — ED Triage Notes (Signed)
Patient here from home with complaints of right leg pain. Reports pain from right hip radiating down entire leg.

## 2017-09-29 NOTE — Discharge Instructions (Signed)
For the discomfort in your right leg we are prescribing prednisone for inflammation.  This should tend to improve your symptoms in the short-term.  Additionally for the discomfort try using heat on the sore area 3 or 4 times a day.  You can use Tylenol if needed to help control the pain.  Avoid lifting, bending and repetitive activities as much as possible.  We are referring you to a neurosurgeon to see for further evaluation of the suspected cause of your pain, from degenerative disc disease of the lower back.  Call them for an appointment.  Your blood pressure was mildly elevated today.  We are giving you a refill, so he can take the medication.  It is important to follow-up with your primary care doctor for recheck of your blood pressure in 3-4 weeks.

## 2017-10-05 ENCOUNTER — Encounter (HOSPITAL_BASED_OUTPATIENT_CLINIC_OR_DEPARTMENT_OTHER): Payer: Self-pay | Admitting: Emergency Medicine

## 2017-10-05 ENCOUNTER — Other Ambulatory Visit: Payer: Self-pay

## 2017-10-05 ENCOUNTER — Emergency Department (HOSPITAL_BASED_OUTPATIENT_CLINIC_OR_DEPARTMENT_OTHER)
Admission: EM | Admit: 2017-10-05 | Discharge: 2017-10-05 | Disposition: A | Discharge status: Home / Self Care | Payer: Worker's Compensation | Attending: Emergency Medicine | Admitting: Emergency Medicine

## 2017-10-05 DIAGNOSIS — N189 Chronic kidney disease, unspecified: Secondary | ICD-10-CM | POA: Diagnosis not present

## 2017-10-05 DIAGNOSIS — B20 Human immunodeficiency virus [HIV] disease: Secondary | ICD-10-CM | POA: Insufficient documentation

## 2017-10-05 DIAGNOSIS — M545 Low back pain: Secondary | ICD-10-CM | POA: Diagnosis present

## 2017-10-05 DIAGNOSIS — I129 Hypertensive chronic kidney disease with stage 1 through stage 4 chronic kidney disease, or unspecified chronic kidney disease: Secondary | ICD-10-CM | POA: Insufficient documentation

## 2017-10-05 DIAGNOSIS — Z79899 Other long term (current) drug therapy: Secondary | ICD-10-CM | POA: Diagnosis not present

## 2017-10-05 DIAGNOSIS — M5441 Lumbago with sciatica, right side: Secondary | ICD-10-CM

## 2017-10-05 DIAGNOSIS — F17210 Nicotine dependence, cigarettes, uncomplicated: Secondary | ICD-10-CM | POA: Diagnosis not present

## 2017-10-05 DIAGNOSIS — M5442 Lumbago with sciatica, left side: Secondary | ICD-10-CM | POA: Insufficient documentation

## 2017-10-05 MED ORDER — METHOCARBAMOL 750 MG PO TABS: 750.0000 mg | ORAL_TABLET | Freq: Three times a day (TID) | ORAL | 0 refills | Status: DC | PRN

## 2017-10-05 MED ORDER — MELOXICAM 7.5 MG PO TABS: 7.5000 mg | ORAL_TABLET | Freq: Every day | ORAL | 0 refills | Status: DC

## 2017-10-05 MED ORDER — KETOROLAC TROMETHAMINE 30 MG/ML IJ SOLN
30.0000 mg | Freq: Once | INTRAMUSCULAR | Status: AC
  Administered 2017-10-05: 30 mg via INTRAMUSCULAR
  Filled 2017-10-05: qty 1

## 2017-10-05 NOTE — Discharge Instructions (Addendum)
I have given you a prescription for Mobic (meloxicam) today.  Mobic is a NSAID medication and you should not take it with other NSAIDs.  Examples of other NSAIDS include motrin, ibuprofen, aleve, naproxen, and Voltaren.  Please monitor your bowel movements for dark, tarry, sticky stools. If you have any bowel movements like this you need to stop taking mobic and call your doctor as this may represent a stomach ulcer from taking NSAIDS.    You are being prescribed a medication which may make you sleepy. For 24 hours after one dose please do not drive, operate heavy machinery, care for a small child with out another adult present, or perform any activities that may cause harm to you or someone else if you were to fall asleep or be impaired.   If you develop fevers, chills, have worsening abdominal pain or any concerns please seek additional medical care.  Please follow up with your doctor early next week.  Please make sure you are staying well hydrated.

## 2017-10-05 NOTE — ED Notes (Addendum)
Pt was treated for lower back pain 1 week ago and returned to work on "light duty", but they made him do more than that and the pain has returned. Pt took Ibuprofen 800 mg PTA.

## 2017-10-05 NOTE — ED Triage Notes (Signed)
Low back pain x 1 week. Treated last week for same.

## 2017-10-05 NOTE — ED Notes (Signed)
ED Provider at bedside. 

## 2017-10-05 NOTE — ED Provider Notes (Signed)
[facility] EMERGENCY DEPARTMENT Provider Note   CSN: [id number] Arrival date & time: 10/05/17  1849     History   Chief Complaint Chief Complaint  Patient presents with  . Back Pain    HPI Phillip Gill is a 42 y.o. male with a history of chronic lower back pain, chronic hepatitis B, HIV, fibromyalgia, who presents today for evaluation of lower back pain.  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  He reports that he then followed up with [employer] [facility] employee health and wellness where he was given light duty.  He reports that initially he was given steroids, states that he took this and it provided relief until today when he tried to go to work, he reports that he was not doing light duty and his back started hurting again and he had to leave early.  He denies any recent fevers or chills.  He denies any numbness or tingling over his upper inner thighs or genitals.  No changes to bowel/bladder function.  He denies any history of IV drug use.  HPI  Past Medical History:  Diagnosis Date  . Anxiety   . Arthritis    "neck" (02/16/2015)  . 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 Chronic lower back pain   . Depression   . Fibromyalgia   . Genital warts   . HIV disease (HCC) 02/28/2015  . HIV infection (HCC)    followed by Dr. Luciana Axe- sees him every 4 months  . Hypertension   . IBS (irritable bowel syndrome)   . Migraine    "none in years" (02/16/2015  . Overweight 07/20/2015  . Renal insufficiency 12/29/2013    Patient Active Problem List   Diagnosis Date Noted  . Sepsis (HCC) 06/21/2017  . Flu-like symptoms   . Overweight 07/20/2015  . Colitis 03/08/2015  . Diarrhea   . HIV (human immunodeficiency virus infection) (HCC)   . Tobacco abuse   . HIV disease (HCC) 02/28/2015  . Leukocytosis   . LGV (lymphogranuloma venereum)   . Primary syphilis   . Chronic hepatitis B (HCC) 10/03/2014  .  Inguinal adenopathy 10/03/2014  . Human immunodeficiency virus (HIV) disease (HCC)   . HTN (hypertension)   . Gynecomastia 04/30/2014  . 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 Chronic pain associated with significant psychosocial dysfunction 09/18/2012  . HBV (hepatitis B virus) infection 09/18/2012  . Tobacco dependence 06/03/2012  . Fibromyalgia muscle pain 02/11/2012  . Depression 12/07/2011  . Anxiety 02/02/2011    Past Surgical History:  Procedure Laterality Date  . CO2 LASER APPLICATION N/A 02/11/2013   Procedure: CO2 LASER APPLICATION;  Surgeon: Romie Levee, MD;  Location: [facility];  Service: General;  Laterality: N/A;  . HIGH RESOLUTION ANOSCOPY N/A 02/11/2013   Procedure: HIGH RESOLUTION ANOSCOPY WITH BIOPSY, LASER ABLATION;  Surgeon: Romie Levee, MD;  Location: [facility];  Service: General;  Laterality: N/A;  . WISDOM TOOTH EXTRACTION          Home Medications    Prior to Admission medications   Medication Sig Start Date End Date Taking? Authorizing Provider  amLODipine-benazepril (LOTREL) 10-20 MG capsule Take 1 capsule by mouth daily. 09/29/17  Yes Mancel Bale, MD  elvitegravir-cobicistat-emtricitabine-tenofovir (GENVOYA) 150-150-200-10 MG TABS tablet Take 1 tablet by mouth daily with breakfast. 07/31/17  Yes Daiva Eves, Lisette Grinder, MD  imiquimod (  ALDARA) 5 % cream APPLY TO TO THE AFFECTED AREA THREE TIMES A WEEK 10/12/15  Yes Daiva EvesVan Dam, Lisette Grinderornelius N, MD  benzonatate (TESSALON) 100 MG capsule Take 1 capsule (100 mg total) by mouth 3 (three) times daily as needed for cough. 06/17/17   Wallis BambergMani, Mario, PA-C  lisinopril (PRINIVIL,ZESTRIL) 20 MG tablet Take 1 tablet (20 mg total) by mouth daily. Patient not taking: Reported on 04/29/2017 07/20/15   Daiva EvesVan Dam, Lisette Grinderornelius N, MD  meloxicam (MOBIC) 7.5 MG tablet Take 1 tablet (7.5 mg total) by mouth daily. 10/05/17   Cristina GongHammond, Nam Vossler W, PA-C  methocarbamol (ROBAXIN) 750 MG tablet Take 1-2 tablets (750-1,500 mg  total) by mouth 3 (three) times daily as needed for muscle spasms. 10/05/17   Cristina GongHammond, Sem Mccaughey W, PA-C  metoprolol succinate (TOPROL-XL) 25 MG 24 hr tablet Take 1 tablet (25 mg total) by mouth daily. Patient not taking: Reported on 06/18/2017 07/20/15   Daiva EvesVan Dam, Lisette Grinderornelius N, MD  ondansetron [facility](ZOFRAN ODT) 8 MG disintegrating tablet 8mg  ODT q4 hours prn nausea Patient not taking: Reported on 07/31/2017 06/18/17   Zadie RhineWickline, Donald, MD  oseltamivir (TAMIFLU) 75 MG capsule Take 1 capsule (75 mg total) by mouth 2 (two) times daily. Patient not taking: Reported on 07/31/2017 06/24/17   Burnadette PopAdhikari, Amrit, MD  predniSONE (DELTASONE) 20 MG tablet Take 1 tablet (20 mg total) by mouth 2 (two) times daily. 09/29/17   Mancel BaleWentz, Elliott, MD  traMADol (ULTRAM) 50 MG tablet Take 1 tablet (50 mg total) by mouth every 6 (six) hours as needed for moderate pain. Patient not taking: Reported on 04/29/2017 12/16/15   Joni ReiningSmith, Ronald K, PA-C    Family History Family History  Problem Relation Age of Onset  . Hypertension Mother   . Diabetes Mother   . Stroke Mother        cerbral aneurysm  . Hypertension Brother   . Mental illness Neg Hx     Social History Social History   Tobacco Use  . Smoking status: Current Every Day Smoker    Packs/day: 0.25    Years: 18.00    Pack years: 4.50    Types: Cigarettes  . Smokeless tobacco: Never Used  Substance Use Topics  . Alcohol use: Yes    Comment: "Once in a blue moon"  . Drug use: No     Allergies   Bactrim [sulfamethoxazole w/trimethoprim (co-trimoxazole)]; Cymbalta [duloxetine hcl]; Other; Zoloft [sertraline hcl]; and Neurontin [gabapentin]   Review of Systems Review of Systems  Constitutional: Negative for chills and fever.  Respiratory: Negative for cough and shortness of breath.   Gastrointestinal: Negative for abdominal pain, diarrhea, nausea and vomiting.  Genitourinary: Negative for decreased urine volume, difficulty urinating and dysuria.    Musculoskeletal: Positive for back pain. Negative for joint swelling.  Skin: Negative for color change, rash and wound.  Neurological: Negative for weakness and numbness.  All other systems reviewed and are negative.    Physical Exam Updated Vital Signs BP 124/90 (BP Location: Right Arm)   Pulse 60   Temp 97.7 F (36.5 C) (Oral)   Resp 18   Ht 6' (1.829 m)   Wt 86.2 kg (190 lb)   SpO2 96%   BMI 25.77 kg/m   Physical Exam  Constitutional: He is oriented to person, place, and time. He appears well-developed and well-nourished. No distress.  HENT:  Head: Normocephalic and atraumatic.  Eyes: Conjunctivae are normal. Right eye exhibits no discharge. Left eye exhibits no discharge. No scleral icterus.  Neck:  Normal range of motion.  Cardiovascular: Normal rate, regular rhythm and intact distal pulses.  2+ DP/PT pulses bilaterally.  Pulmonary/Chest: Effort normal. No stridor. No respiratory distress.  Abdominal: Soft. Normal appearance and bowel sounds are normal. He exhibits no distension. There is tenderness in the right lower quadrant and left lower quadrant. There is no rebound and no guarding.  Musculoskeletal: He exhibits no edema or deformity.  Palpation over bilateral lumbar paraspinal muscles both re-creates and exacerbate his reported pain.  Neurological: He is alert and oriented to person, place, and time. He exhibits normal muscle tone.  Sensation intact to bilateral lower extremities.  Skin: Skin is warm and dry. He is not diaphoretic.  Psychiatric: He has a normal mood and affect. His behavior is normal.  Nursing note and vitals reviewed.    ED Treatments / Results  Labs (all labs ordered are listed, but only abnormal results are displayed) Labs Reviewed - No data to display  EKG None  Radiology No results found.  Procedures Procedures (including critical care time)  Medications Ordered in ED Medications  ketorolac (TORADOL) 30 MG/ML injection 30 mg  (30 mg Intramuscular Given 10/05/17 2308)     Initial Impression / Assessment and Plan / ED Course  I have reviewed the triage vital signs and the nursing notes.  Pertinent labs & imaging results that were available during my care of the patient were reviewed by me and considered in my medical decision making (see chart for details).    Patient with back pain.  No neurological deficits and normal neuro exam.  Patient can walk but states is painful.  No loss of bowel or bladder control.  No concern for cauda equina.  No fever, night sweats, weight loss, h/o cancer, IVDU.  Patient did have bilateral lower quadrant abdominal tenderness, we discussed that this may be related to his back pain, however I expressed concern that he may have a intra-abdominal etiology for what he is feeling his back pain.  I recommended labs, CAT scan, UA and further evaluation especially based on patient's history of HIV.  Patient refused labs and imaging, stating that he feels this is just his muscle pain and needs a note for work.  He stated his understanding of these risks.  We discussed potential treatment options and after discussion of risks and benefits he elected to try Mobic and Robaxin for his symptoms.   Most recent labs showed normal cr.  We discussed return precautions and he stated his understanding.  He was given a work note and instructed to follow-up with occupational health and wellness on Monday.  RICE protocol and pain medicine indicated and discussed with patient.    Final Clinical Impressions(s) / ED Diagnoses   Final diagnoses:  Acute bilateral low back pain with bilateral sciatica    ED Discharge Orders        Ordered    meloxicam (MOBIC) 7.5 MG tablet  Daily     10/05/17 2256    methocarbamol (ROBAXIN) 750 MG tablet  3 times daily PRN     10/05/17 2256       Cristina Gong, PA-C 10/05/17 2314    Arby Barrette, MD 10/11/17 1523

## 2017-11-20 ENCOUNTER — Other Ambulatory Visit: Payer: Self-pay

## 2017-11-20 DIAGNOSIS — B20 Human immunodeficiency virus [HIV] disease: Secondary | ICD-10-CM

## 2017-11-20 MED ORDER — ELVITEG-COBIC-EMTRICIT-TENOFAF 150-150-200-10 MG PO TABS: 1.0000 | ORAL_TABLET | Freq: Every day | ORAL | 11 refills | Status: DC

## 2017-12-01 ENCOUNTER — Other Ambulatory Visit: Payer: Self-pay

## 2017-12-01 ENCOUNTER — Emergency Department (HOSPITAL_BASED_OUTPATIENT_CLINIC_OR_DEPARTMENT_OTHER)
Admission: EM | Admit: 2017-12-01 | Discharge: 2017-12-01 | Disposition: A | Discharge status: Home / Self Care | Payer: No Typology Code available for payment source | Attending: Emergency Medicine | Admitting: Emergency Medicine

## 2017-12-01 ENCOUNTER — Encounter (HOSPITAL_BASED_OUTPATIENT_CLINIC_OR_DEPARTMENT_OTHER): Payer: Self-pay | Admitting: Emergency Medicine

## 2017-12-01 DIAGNOSIS — I129 Hypertensive chronic kidney disease with stage 1 through stage 4 chronic kidney disease, or unspecified chronic kidney disease: Secondary | ICD-10-CM | POA: Insufficient documentation

## 2017-12-01 DIAGNOSIS — F17210 Nicotine dependence, cigarettes, uncomplicated: Secondary | ICD-10-CM | POA: Insufficient documentation

## 2017-12-01 DIAGNOSIS — N189 Chronic kidney disease, unspecified: Secondary | ICD-10-CM | POA: Insufficient documentation

## 2017-12-01 DIAGNOSIS — F410 Panic disorder [episodic paroxysmal anxiety] without agoraphobia: Secondary | ICD-10-CM

## 2017-12-01 DIAGNOSIS — B20 Human immunodeficiency virus [HIV] disease: Secondary | ICD-10-CM | POA: Insufficient documentation

## 2017-12-01 DIAGNOSIS — F419 Anxiety disorder, unspecified: Secondary | ICD-10-CM | POA: Insufficient documentation

## 2017-12-01 DIAGNOSIS — Z79899 Other long term (current) drug therapy: Secondary | ICD-10-CM | POA: Insufficient documentation

## 2017-12-01 MED ORDER — ALPRAZOLAM 0.25 MG PO TABS: 0.2500 mg | ORAL_TABLET | Freq: Every evening | ORAL | 0 refills | Status: DC | PRN

## 2017-12-01 MED ORDER — LORAZEPAM 1 MG PO TABS
1.0000 mg | ORAL_TABLET | Freq: Once | ORAL | Status: AC
  Administered 2017-12-01: 1 mg via ORAL
  Filled 2017-12-01: qty 1

## 2017-12-01 MED ORDER — LORAZEPAM 2 MG/ML IJ SOLN: 1.0000 mg | Freq: Once | INTRAMUSCULAR | Status: DC

## 2017-12-01 NOTE — ED Triage Notes (Signed)
Patient states that he has had a feeling of pins and needles in his chest with SOB x "14 hours" - patient states that it started while he was at work. He has a hx of panic attacks but they dont usually last this long

## 2017-12-01 NOTE — ED Notes (Signed)
Pt reports bit by 2 mosquitos last night. One in back of neck and another in right upper arm, symptoms began right after.

## 2017-12-01 NOTE — ED Notes (Signed)
Pt on cardiac monitor and auto VS 

## 2017-12-01 NOTE — ED Notes (Signed)
Visitor came out to ask if pt could have water, went to talk to pt, pt has large sub. Waiting for Dr Fredderick PhenixBelfi for instructions on PO intake.

## 2017-12-01 NOTE — ED Provider Notes (Signed)
[facility] EMERGENCY DEPARTMENT Provider Note   CSN: [id number] Arrival date & time: 12/01/17  1342     History   Chief Complaint Chief Complaint  Patient presents with  . Shortness of Breath    HPI Phillip Gill is a 42 y.o. male.  PT is a 42yo male with hx of HIV, hepatitis B, hypertension and anxiety who presents with an anxiety attack.  He states he has been under a lot of stress recently.  He is changed jobs and is in school and working a lot.  He also is newly engaged.  He states he started having a panic attack last night and it continued through the night and today.  He feels very anxious.  He has some associated chest pain or shortness of breath.  He feels tingling in his hands and his feet.  He has some occasional dizziness.  Since he has arrived to the emergency department, he has started to feel better.  He no longer has any chest pain.  Shortness of breath has resolved.  He still feels a little anxious.  He denies any suicidal or homicidal ideations.  He states this episode is similar to his prior anxiety attacks but was a little bit worse today.  He denies any recent fevers coughing or cold symptoms.  No history of lung problems.     Past Medical History:  Diagnosis Date  . Anxiety   . Arthritis    "neck" (02/16/2015)  . 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 Chronic lower back pain   . Depression   . Fibromyalgia   . Genital warts   . HIV disease (HCC) 02/28/2015  . HIV infection (HCC)    followed by Dr. Luciana Axe- sees him every 4 months  . Hypertension   . IBS (irritable bowel syndrome)   . Migraine    "none in years" (02/16/2015  . Overweight 07/20/2015  . Renal insufficiency 12/29/2013    Patient Active Problem List   Diagnosis Date Noted  . Sepsis (HCC) 06/21/2017  . Flu-like symptoms   . Overweight 07/20/2015  . Colitis 03/08/2015  . Diarrhea   . HIV (human immunodeficiency virus infection) (HCC)   . Tobacco abuse   . HIV  disease (HCC) 02/28/2015  . Leukocytosis   . LGV (lymphogranuloma venereum)   . Primary syphilis   . Chronic hepatitis B (HCC) 10/03/2014  . Inguinal adenopathy 10/03/2014  . Human immunodeficiency virus (HIV) disease (HCC)   . HTN (hypertension)   . Gynecomastia 04/30/2014  . 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 Chronic pain associated with significant psychosocial dysfunction 09/18/2012  . HBV (hepatitis B virus) infection 09/18/2012  . Tobacco dependence 06/03/2012  . Fibromyalgia muscle pain 02/11/2012  . Depression 12/07/2011  . Anxiety 02/02/2011    Past Surgical History:  Procedure Laterality Date  . CO2 LASER APPLICATION N/A 02/11/2013   Procedure: CO2 LASER APPLICATION;  Surgeon: Romie Levee, MD;  Location: [facility];  Service: General;  Laterality: N/A;  . HIGH RESOLUTION ANOSCOPY N/A 02/11/2013   Procedure: HIGH RESOLUTION ANOSCOPY WITH BIOPSY, LASER ABLATION;  Surgeon: Romie Levee, MD;  Location: [facility];  Service: General;  Laterality: N/A;  . WISDOM TOOTH EXTRACTION          Home Medications    Prior to Admission medications   Medication Sig Start Date End Date Taking? Authorizing Provider  ALPRAZolam (XANAX) 0.25 MG tablet Take 1 tablet (0.25 mg  total) by mouth at bedtime as needed for anxiety. 12/01/17   Rolan Bucco, MD  amLODipine-benazepril (LOTREL) 10-20 MG capsule Take 1 capsule by mouth daily. 09/29/17   Mancel Bale, MD  benzonatate (TESSALON) 100 MG capsule Take 1 capsule (100 mg total) by mouth 3 (three) times daily as needed for cough. 06/17/17   Wallis Bamberg, PA-C  elvitegravir-cobicistat-emtricitabine-tenofovir (GENVOYA) 150-150-200-10 MG TABS tablet Take 1 tablet by mouth daily with breakfast. 11/20/17   Veryl Speak, FNP  imiquimod (ALDARA) 5 % cream APPLY TO TO THE AFFECTED AREA THREE TIMES A WEEK 10/12/15   Daiva Eves, Lisette Grinder, MD  lisinopril (PRINIVIL,ZESTRIL) 20 MG tablet Take 1 tablet (20 mg total) by mouth  daily. Patient not taking: Reported on 04/29/2017 07/20/15   Daiva Eves, Lisette Grinder, MD  meloxicam (MOBIC) 7.5 MG tablet Take 1 tablet (7.5 mg total) by mouth daily. 10/05/17   Cristina Gong, PA-C  methocarbamol (ROBAXIN) 750 MG tablet Take 1-2 tablets (750-1,500 mg total) by mouth 3 (three) times daily as needed for muscle spasms. 10/05/17   Cristina Gong, PA-C  metoprolol succinate (TOPROL-XL) 25 MG 24 hr tablet Take 1 tablet (25 mg total) by mouth daily. Patient not taking: Reported on 06/18/2017 07/20/15   Daiva Eves, Lisette Grinder, MD  ondansetron [facility] ODT) 8 MG disintegrating tablet 8mg  ODT q4 hours prn nausea Patient not taking: Reported on 07/31/2017 06/18/17   Zadie Rhine, MD  oseltamivir (TAMIFLU) 75 MG capsule Take 1 capsule (75 mg total) by mouth 2 (two) times daily. Patient not taking: Reported on 07/31/2017 06/24/17   Burnadette Pop, MD  predniSONE (DELTASONE) 20 MG tablet Take 1 tablet (20 mg total) by mouth 2 (two) times daily. 09/29/17   Mancel Bale, MD  traMADol (ULTRAM) 50 MG tablet Take 1 tablet (50 mg total) by mouth every 6 (six) hours as needed for moderate pain. Patient not taking: Reported on 04/29/2017 12/16/15   Joni Reining, PA-C    Family History Family History  Problem Relation Age of Onset  . Hypertension Mother   . Diabetes Mother   . Stroke Mother        cerbral aneurysm  . Hypertension Brother   . Mental illness Neg Hx     Social History Social History   Tobacco Use  . Smoking status: Current Every Day Smoker    Packs/day: 0.25    Years: 18.00    Pack years: 4.50    Types: Cigarettes  . Smokeless tobacco: Never Used  Substance Use Topics  . Alcohol use: Yes    Comment: "Once in a blue moon"  . Drug use: No     Allergies   Bactrim [sulfamethoxazole w/trimethoprim (co-trimoxazole)]; Cymbalta [duloxetine hcl]; Other; Zoloft [sertraline hcl]; and Neurontin [gabapentin]   Review of Systems Review of Systems  Constitutional:  Positive for fatigue. Negative for chills, diaphoresis and fever.  HENT: Negative for congestion, rhinorrhea and sneezing.   Eyes: Negative.   Respiratory: Positive for shortness of breath. Negative for cough and chest tightness.   Cardiovascular: Positive for chest pain. Negative for leg swelling.  Gastrointestinal: Negative for abdominal pain, blood in stool, diarrhea, nausea and vomiting.  Genitourinary: Negative for difficulty urinating, flank pain, frequency and hematuria.  Musculoskeletal: Negative for arthralgias and back pain.  Skin: Negative for rash.  Neurological: Positive for headaches. Negative for dizziness, speech difficulty, weakness and numbness.  Psychiatric/Behavioral: The patient is nervous/anxious.      Physical Exam Updated Vital Signs BP 122/83  Pulse 73   Temp 98 F (36.7 C) (Oral)   Resp 18   Ht 6\' 1"  (1.854 m)   Wt 86.2 kg (190 lb)   SpO2 97%   BMI 25.07 kg/m   Physical Exam  Constitutional: He is oriented to person, place, and time. He appears well-developed and well-nourished.  HENT:  Head: Normocephalic and atraumatic.  Eyes: Pupils are equal, round, and reactive to light.  Neck: Normal range of motion. Neck supple.  Cardiovascular: Normal rate, regular rhythm and normal heart sounds.  Pulmonary/Chest: Effort normal and breath sounds normal. No respiratory distress. He has no wheezes. He has no rales. He exhibits no tenderness.  Abdominal: Soft. Bowel sounds are normal. There is no tenderness. There is no rebound and no guarding.  Musculoskeletal: Normal range of motion. He exhibits no edema.       Right lower leg: He exhibits no tenderness and no edema.       Left lower leg: He exhibits no tenderness and no edema.  Lymphadenopathy:    He has no cervical adenopathy.  Neurological: He is alert and oriented to person, place, and time.  Skin: Skin is warm and dry. No rash noted.  Psychiatric: He has a normal mood and affect.     ED Treatments  / Results  Labs (all labs ordered are listed, but only abnormal results are displayed) Labs Reviewed - No data to display  EKG EKG Interpretation  Date/Time:  Sunday December 01 2017 13:47:16 EDT Ventricular Rate:  89 PR Interval:  186 QRS Duration: 100 QT Interval:  364 QTC Calculation: 442 R Axis:   35 Text Interpretation:  Normal sinus rhythm Septal infarct , age undetermined Abnormal ECG since last tracing no significant change Confirmed by Kassity Woodson ([id number]) on 12/01/2017 3:00:30 PM   Radiology No results found.  Procedures Procedures (including critical care time)  Medications Ordered in ED Medications  LORazepam (ATIVAN) tablet 1 mg (1 mg Oral Given 12/01/17 1534)     Initial Impression / Assessment and Plan / ED Course  I have reviewed the triage vital signs and the nursing notes.  Pertinent labs & imaging results that were available during my care of the patient were reviewed by me and considered in my medical decision making (see chart for details).    Patient presents with anxiety attack.  He is currently back to baseline but still feeling anxious.  He request something for his anxiety.  He was given a dose of Ativan.  He requests a prescription for anxiety medicines.  I did advise him that this is best done through her primary care physician but I would give him a few tablets of Xanax.  He was given a prescription for 5 tablets.  He was given resources for outpatient counseling.  He currently has no shortness of breath.  His lungs are clear.  He has no hypoxia.  His EKG does not show any arrhythmias or ischemic changes.  He was discharged home in good condition.  Return precautions were given.  Final Clinical Impressions(s) / ED Diagnoses   Final diagnoses:  Anxiety attack    ED Discharge Orders        Ordered    ALPRAZolam (XANAX) 0.25 MG tablet  At bedtime PRN     06 /02/19 1530       Rolan BuccoBelfi, Tenicia Gural, MD 12/01/17 1556

## 2017-12-06 ENCOUNTER — Encounter: Payer: Self-pay | Admitting: Infectious Disease

## 2018-01-09 ENCOUNTER — Encounter (HOSPITAL_COMMUNITY): Payer: Self-pay | Admitting: Emergency Medicine

## 2018-01-09 ENCOUNTER — Other Ambulatory Visit: Payer: Self-pay

## 2018-01-09 DIAGNOSIS — B20 Human immunodeficiency virus [HIV] disease: Secondary | ICD-10-CM | POA: Insufficient documentation

## 2018-01-09 DIAGNOSIS — F17210 Nicotine dependence, cigarettes, uncomplicated: Secondary | ICD-10-CM | POA: Insufficient documentation

## 2018-01-09 DIAGNOSIS — J069 Acute upper respiratory infection, unspecified: Secondary | ICD-10-CM | POA: Insufficient documentation

## 2018-01-09 DIAGNOSIS — Z79899 Other long term (current) drug therapy: Secondary | ICD-10-CM | POA: Insufficient documentation

## 2018-01-09 DIAGNOSIS — R0981 Nasal congestion: Secondary | ICD-10-CM | POA: Insufficient documentation

## 2018-01-09 DIAGNOSIS — I10 Essential (primary) hypertension: Secondary | ICD-10-CM | POA: Insufficient documentation

## 2018-01-09 NOTE — ED Triage Notes (Signed)
Pt from home with c/o cough and congestion x 2 days. Pt states cough is productive. Pt states he has been taking theraflu. Pt reports intermittent nausea but no emesis

## 2018-01-10 ENCOUNTER — Emergency Department (HOSPITAL_COMMUNITY)
Admission: EM | Admit: 2018-01-10 | Discharge: 2018-01-10 | Disposition: A | Discharge status: Home / Self Care | Payer: No Typology Code available for payment source | Attending: Emergency Medicine | Admitting: Emergency Medicine

## 2018-01-10 DIAGNOSIS — J069 Acute upper respiratory infection, unspecified: Secondary | ICD-10-CM

## 2018-01-10 DIAGNOSIS — R0981 Nasal congestion: Secondary | ICD-10-CM

## 2018-01-10 MED ORDER — BENZONATATE 100 MG PO CAPS: 100.0000 mg | ORAL_CAPSULE | Freq: Three times a day (TID) | ORAL | 0 refills | Status: DC

## 2018-01-10 MED ORDER — FLUTICASONE PROPIONATE 50 MCG/ACT NA SUSP: 2.0000 | Freq: Every day | NASAL | 0 refills | Status: DC

## 2018-01-10 NOTE — Discharge Instructions (Signed)
Your symptoms are likely consistent with a viral illness. Viruses do not require or respond to antibiotics. Treatment is symptomatic care and it is important to note that these symptoms may last for 7-14 days.   Hand washing: Wash your hands throughout the day, but especially before and after touching the face, using the restroom, sneezing, coughing, or touching surfaces that have been coughed or sneezed upon. Hydration: Symptoms will be intensified and complicated by dehydration. Dehydration can also extend the duration of symptoms. Drink plenty of fluids and get plenty of rest. You should be drinking at least half a liter of water an hour to stay hydrated. Electrolyte drinks (ex. Gatorade, Powerade, Pedialyte) are also encouraged. You should be drinking enough fluids to make your urine light yellow, almost clear. If this is not the case, you are not drinking enough water. Please note that some of the treatments indicated below will not be effective if you are not adequately hydrated. Diet: Please concentrate on hydration, however, you may introduce food slowly.  Start with a clear liquid diet, progressed to a full liquid diet, and then bland solids as you are able. Pain or fever: Ibuprofen, Naproxen, or Tylenol for pain or fever.  Cough: Use the Tessalon for cough.  Zyrtec or Claritin: May add these medication daily to control underlying symptoms of congestion, sneezing, and other signs of allergies.  These medications are available over-the-counter. Flonase: Use this medication, as directed, for nasal and sinus congestion.  This medication is available over-the-counter. Congestion: Plain Mucinex may help relieve congestion. Saline sinus rinses and saline nasal sprays may also help relieve congestion. If you do not have heart problems or an allergy to such medications, you may also try phenylephrine or Sudafed. Sore throat: Warm liquids or Chloraseptic spray may help soothe a sore throat. Gargle twice a  day with a salt water solution made from a half teaspoon of salt in a cup of warm water.  Follow up: Follow up with a primary care provider within the next two weeks should symptoms fail to resolve. Return: Return to the ED for significantly worsening symptoms, shortness of breath, persistent vomiting, large amounts of blood in stool, or any other major concerns.

## 2018-01-10 NOTE — ED Provider Notes (Signed)
[facility]-EMERGENCY DEPT Provider Note   CSN: [id number] Arrival date & time: 01/09/18  2318     History   Chief Complaint Chief Complaint  Patient presents with  . Nasal Congestion  . Cough    HPI Phillip Gill is a 42 y.o. male.  HPI   Phillip Gill is a 42 y.o. male, with a history of HIV and fibromyalgia, presenting to the ED with nasal congestion, rhinorrhea, and cough for the last 2 days.  Subjective fever. Has tried TheraFlu without improvement.  Denies shortness of breath, chest pain, abdominal pain, rash, N/V/D, facial swelling, or any other complaints.  Patient's last noted HIV quant and CD4 count performed January 2019: 28 and 1600, respectively.  Past Medical History:  Diagnosis Date  . Anxiety   . Arthritis    "neck" (02/16/2015)  . 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 Chronic lower back pain   . Depression   . Fibromyalgia   . Genital warts   . HIV disease (HCC) 02/28/2015  . HIV infection (HCC)    followed by Dr. Luciana Axe- sees him every 4 months  . Hypertension   . IBS (irritable bowel syndrome)   . Migraine    "none in years" (02/16/2015  . Overweight 07/20/2015  . Renal insufficiency 12/29/2013    Patient Active Problem List   Diagnosis Date Noted  . Sepsis (HCC) 06/21/2017  . Flu-like symptoms   . Overweight 07/20/2015  . Colitis 03/08/2015  . Diarrhea   . HIV (human immunodeficiency virus infection) (HCC)   . Tobacco abuse   . HIV disease (HCC) 02/28/2015  . Leukocytosis   . LGV (lymphogranuloma venereum)   . Primary syphilis   . Chronic hepatitis B (HCC) 10/03/2014  . Inguinal adenopathy 10/03/2014  . Human immunodeficiency virus (HIV) disease (HCC)   . HTN (hypertension)   . Gynecomastia 04/30/2014  . 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 Chronic pain associated with significant psychosocial dysfunction 09/18/2012  . HBV (hepatitis B virus) infection 09/18/2012  . Tobacco dependence 06/03/2012  .  Fibromyalgia muscle pain 02/11/2012  . Depression 12/07/2011  . Anxiety 02/02/2011    Past Surgical History:  Procedure Laterality Date  . CO2 LASER APPLICATION N/A 02/11/2013   Procedure: CO2 LASER APPLICATION;  Surgeon: Romie Levee, MD;  Location: [facility];  Service: General;  Laterality: N/A;  . HIGH RESOLUTION ANOSCOPY N/A 02/11/2013   Procedure: HIGH RESOLUTION ANOSCOPY WITH BIOPSY, LASER ABLATION;  Surgeon: Romie Levee, MD;  Location: [facility];  Service: General;  Laterality: N/A;  . WISDOM TOOTH EXTRACTION          Home Medications    Prior to Admission medications   Medication Sig Start Date End Date Taking? Authorizing Provider  ALPRAZolam (XANAX) 0.25 MG tablet Take 1 tablet (0.25 mg total) by mouth at bedtime as needed for anxiety. 12/01/17   Rolan Bucco, MD  amLODipine-benazepril (LOTREL) 10-20 MG capsule Take 1 capsule by mouth daily. 09/29/17   Mancel Bale, MD  benzonatate (TESSALON) 100 MG capsule Take 1 capsule (100 mg total) by mouth 3 (three) times daily as needed for cough. 06/17/17   Wallis Bamberg, PA-C  elvitegravir-cobicistat-emtricitabine-tenofovir (GENVOYA) 150-150-200-10 MG TABS tablet Take 1 tablet by mouth daily with breakfast. 11/20/17   Veryl Speak, FNP  imiquimod (ALDARA) 5 % cream APPLY TO TO THE AFFECTED AREA THREE TIMES A WEEK 10/12/15   Daiva Eves, Lisette Grinder, MD  lisinopril (PRINIVIL,ZESTRIL) 20 MG tablet Take 1 tablet (20 mg total) by mouth daily. Patient not taking: Reported on 04/29/2017 07/20/15   Daiva EvesVan Dam, Lisette Grinderornelius N, MD  meloxicam (MOBIC) 7.5 MG tablet Take 1 tablet (7.5 mg total) by mouth daily. 10/05/17   Cristina GongHammond, Elizabeth W, PA-C  methocarbamol (ROBAXIN) 750 MG tablet Take 1-2 tablets (750-1,500 mg total) by mouth 3 (three) times daily as needed for muscle spasms. 10/05/17   Cristina GongHammond, Elizabeth W, PA-C  metoprolol succinate (TOPROL-XL) 25 MG 24 hr tablet Take 1 tablet (25 mg total) by mouth daily. Patient  not taking: Reported on 06/18/2017 07/20/15   Daiva EvesVan Dam, Lisette Grinderornelius N, MD  ondansetron [facility](ZOFRAN ODT) 8 MG disintegrating tablet 8mg  ODT q4 hours prn nausea Patient not taking: Reported on 07/31/2017 06/18/17   Zadie RhineWickline, Donald, MD  oseltamivir (TAMIFLU) 75 MG capsule Take 1 capsule (75 mg total) by mouth 2 (two) times daily. Patient not taking: Reported on 07/31/2017 06/24/17   Burnadette PopAdhikari, Amrit, MD  predniSONE (DELTASONE) 20 MG tablet Take 1 tablet (20 mg total) by mouth 2 (two) times daily. 09/29/17   Mancel BaleWentz, Elliott, MD  traMADol (ULTRAM) 50 MG tablet Take 1 tablet (50 mg total) by mouth every 6 (six) hours as needed for moderate pain. Patient not taking: Reported on 04/29/2017 12/16/15   Joni ReiningSmith, Ronald K, PA-C    Family History Family History  Problem Relation Age of Onset  . Hypertension Mother   . Diabetes Mother   . Stroke Mother        cerbral aneurysm  . Hypertension Brother   . Mental illness Neg Hx     Social History Social History   Tobacco Use  . Smoking status: Current Every Day Smoker    Packs/day: 0.25    Years: 18.00    Pack years: 4.50    Types: Cigarettes  . Smokeless tobacco: Never Used  Substance Use Topics  . Alcohol use: Yes    Comment: "Once in a blue moon"  . Drug use: No     Allergies   Bactrim [sulfamethoxazole w/trimethoprim (co-trimoxazole)]; Cymbalta [duloxetine hcl]; Other; Zoloft [sertraline hcl]; and Neurontin [gabapentin]   Review of Systems Review of Systems  HENT: Positive for congestion and rhinorrhea. Negative for facial swelling, trouble swallowing and voice change.   Respiratory: Positive for cough. Negative for shortness of breath.   Cardiovascular: Negative for chest pain.  Gastrointestinal: Negative for abdominal pain, diarrhea, nausea and vomiting.  Skin: Negative for rash.  All other systems reviewed and are negative.    Physical Exam Updated Vital Signs BP 134/86 (BP Location: Left Arm)   Pulse (!) 111   Temp 98.1 F (36.7 C)  (Oral)   Resp 18   SpO2 94%   Physical Exam  Constitutional: He appears well-developed and well-nourished. No distress.  HENT:  Head: Normocephalic and atraumatic.  Right Ear: Tympanic membrane, external ear and ear canal normal.  Left Ear: Tympanic membrane, external ear and ear canal normal.  Nose: Mucosal edema present. Right sinus exhibits no maxillary sinus tenderness and no frontal sinus tenderness. Left sinus exhibits no maxillary sinus tenderness and no frontal sinus tenderness.  Mouth/Throat: Uvula is midline and oropharynx is clear and moist.  Eyes: Conjunctivae are normal.  Neck: Neck supple.  Cardiovascular: Normal rate, regular rhythm, normal heart sounds and intact distal pulses.  Pulmonary/Chest: Effort normal and breath sounds normal. No respiratory distress.  No increased work of breathing.  Speaks in full sentences without difficulty.  Abdominal: Soft. There  is no tenderness. There is no guarding.  Musculoskeletal: He exhibits no edema.  Lymphadenopathy:    He has no cervical adenopathy.  Neurological: He is alert.  Skin: Skin is warm and dry. He is not diaphoretic.  Psychiatric: He has a normal mood and affect. His behavior is normal.  Nursing note and vitals reviewed.    ED Treatments / Results  Labs (all labs ordered are listed, but only abnormal results are displayed) Labs Reviewed - No data to display  EKG None  Radiology No results found.  Procedures Procedures (including critical care time)  Medications Ordered in ED Medications - No data to display   Initial Impression / Assessment and Plan / ED Course  I have reviewed the triage vital signs and the nursing notes.  Pertinent labs & imaging results that were available during my care of the patient were reviewed by me and considered in my medical decision making (see chart for details).     Patient presents with nasal congestion and cough. Patient is nontoxic appearing, afebrile, not  tachycardic on my exam, not tachypneic, not hypotensive, maintains adequate SPO2 on room air, and is in no apparent distress.   The patient was given instructions for home care as well as return precautions. Patient voices understanding of these instructions, accepts the plan, and is comfortable with discharge.  Final Clinical Impressions(s) / ED Diagnoses   Final diagnoses:  Nasal congestion  Upper respiratory tract infection, unspecified type    ED Discharge Orders    None       Concepcion Living 01/10/18 0339    Molpus, Jonny Ruiz, MD 01/10/18 [phone]

## 2018-01-14 ENCOUNTER — Other Ambulatory Visit: Payer: Self-pay

## 2018-01-15 ENCOUNTER — Other Ambulatory Visit: Payer: Self-pay

## 2018-01-15 DIAGNOSIS — B20 Human immunodeficiency virus [HIV] disease: Secondary | ICD-10-CM

## 2018-01-15 DIAGNOSIS — B181 Chronic viral hepatitis B without delta-agent: Secondary | ICD-10-CM

## 2018-01-15 DIAGNOSIS — I10 Essential (primary) hypertension: Secondary | ICD-10-CM

## 2018-01-15 DIAGNOSIS — A55 Chlamydial lymphogranuloma (venereum): Secondary | ICD-10-CM

## 2018-01-15 DIAGNOSIS — Z79899 Other long term (current) drug therapy: Secondary | ICD-10-CM

## 2018-01-16 LAB — T-HELPER CELL (CD4) - (RCID CLINIC ONLY)
CD4 % Helper T Cell: 35 % (ref 33–55)
CD4 T CELL ABS: 1750 /uL (ref 400–2700)

## 2018-01-16 LAB — CBC WITH DIFFERENTIAL/PLATELET
BASOS ABS: 24 {cells}/uL (ref 0–200)
BASOS PCT: 0.2 %
EOS PCT: 0.6 %
Eosinophils Absolute: 73 cells/uL (ref 15–500)
HEMATOCRIT: 42.7 % (ref 38.5–50.0)
HEMOGLOBIN: 14.5 g/dL (ref 13.2–17.1)
LYMPHS ABS: 4646 {cells}/uL — AB (ref 850–3900)
MCH: 30.5 pg (ref 27.0–33.0)
MCHC: 34 g/dL (ref 32.0–36.0)
MCV: 89.7 fL (ref 80.0–100.0)
MPV: 10.7 fL (ref 7.5–12.5)
Monocytes Relative: 6.7 %
NEUTROS ABS: 6546 {cells}/uL (ref 1500–7800)
Neutrophils Relative %: 54.1 %
Platelets: 236 10*3/uL (ref 140–400)
RBC: 4.76 10*6/uL (ref 4.20–5.80)
RDW: 13.5 % (ref 11.0–15.0)
Total Lymphocyte: 38.4 %
WBC mixed population: 811 cells/uL (ref 200–950)
WBC: 12.1 10*3/uL — AB (ref 3.8–10.8)

## 2018-01-16 LAB — COMPLETE METABOLIC PANEL WITH GFR
AG RATIO: 1.3 (calc) (ref 1.0–2.5)
ALKALINE PHOSPHATASE (APISO): 69 U/L (ref 40–115)
ALT: 10 U/L (ref 9–46)
AST: 17 U/L (ref 10–40)
Albumin: 4.3 g/dL (ref 3.6–5.1)
BILIRUBIN TOTAL: 0.2 mg/dL (ref 0.2–1.2)
BUN: 14 mg/dL (ref 7–25)
CHLORIDE: 105 mmol/L (ref 98–110)
CO2: 25 mmol/L (ref 20–32)
Calcium: 9.4 mg/dL (ref 8.6–10.3)
Creat: 1.22 mg/dL (ref 0.60–1.35)
GFR, EST AFRICAN AMERICAN: 85 mL/min/{1.73_m2} (ref >=60)
GFR, Est Non African American: 73 mL/min/{1.73_m2} (ref >=60)
GLUCOSE: 108 mg/dL — AB (ref 65–99)
Globulin: 3.4 g/dL (calc) (ref 1.9–3.7)
Potassium: 4.2 mmol/L (ref 3.5–5.3)
Sodium: 139 mmol/L (ref 135–146)
Total Protein: 7.7 g/dL (ref 6.1–8.1)

## 2018-01-16 LAB — LIPID PANEL
CHOL/HDL RATIO: 4.7 (calc) (ref <5.0)
Cholesterol: 170 mg/dL (ref <200)
HDL: 36 mg/dL — AB (ref >40)
LDL CHOLESTEROL (CALC): 109 mg/dL — AB
NON-HDL CHOLESTEROL (CALC): 134 mg/dL — AB (ref <130)
TRIGLYCERIDES: 135 mg/dL (ref <150)

## 2018-01-16 LAB — RPR: RPR Ser Ql: NONREACTIVE

## 2018-01-16 LAB — URINE CYTOLOGY ANCILLARY ONLY
Chlamydia: NEGATIVE
Neisseria Gonorrhea: NEGATIVE

## 2018-01-17 LAB — HIV-1 RNA QUANT-NO REFLEX-BLD
HIV 1 RNA Quant: 44 copies/mL — ABNORMAL HIGH
HIV-1 RNA Quant, Log: 1.64 Log copies/mL — ABNORMAL HIGH

## 2018-01-23 ENCOUNTER — Encounter: Payer: Self-pay | Admitting: Infectious Disease

## 2018-01-26 ENCOUNTER — Ambulatory Visit (HOSPITAL_COMMUNITY)
Admission: EM | Admit: 2018-01-26 | Discharge: 2018-01-26 | Disposition: A | Discharge status: Home / Self Care | Payer: No Typology Code available for payment source | Attending: Internal Medicine | Admitting: Internal Medicine

## 2018-01-26 ENCOUNTER — Encounter (HOSPITAL_COMMUNITY): Payer: Self-pay

## 2018-01-26 DIAGNOSIS — G43809 Other migraine, not intractable, without status migrainosus: Secondary | ICD-10-CM

## 2018-01-26 DIAGNOSIS — F411 Generalized anxiety disorder: Secondary | ICD-10-CM | POA: Diagnosis not present

## 2018-01-26 DIAGNOSIS — R110 Nausea: Secondary | ICD-10-CM | POA: Diagnosis not present

## 2018-01-26 MED ORDER — KETOROLAC TROMETHAMINE 60 MG/2ML IM SOLN
60.0000 mg | Freq: Once | INTRAMUSCULAR | Status: AC
  Administered 2018-01-26: 60 mg via INTRAMUSCULAR

## 2018-01-26 MED ORDER — ONDANSETRON 4 MG PO TBDP
4.0000 mg | ORAL_TABLET | Freq: Once | ORAL | Status: AC
  Administered 2018-01-26: 4 mg via ORAL

## 2018-01-26 MED ORDER — ONDANSETRON 4 MG PO TBDP
ORAL_TABLET | ORAL | Status: AC
  Filled 2018-01-26: qty 1

## 2018-01-26 MED ORDER — HYDROXYZINE HCL 25 MG PO TABS: 25.0000 mg | ORAL_TABLET | Freq: Four times a day (QID) | ORAL | 0 refills | Status: DC

## 2018-01-26 MED ORDER — KETOROLAC TROMETHAMINE 60 MG/2ML IM SOLN
INTRAMUSCULAR | Status: AC
  Filled 2018-01-26: qty 2

## 2018-01-26 NOTE — Discharge Instructions (Signed)
Vistaril as needed for anxiety, may take tonight which may also help with headache. May cause drowsiness. Please do not take if driving or drinking alcohol.  Tylenol as needed for headache.  Please establish with a primary care provider for further evaluation and management of your anxiety. If develop worsening of symptoms please go to Er.

## 2018-01-26 NOTE — ED Provider Notes (Signed)
[facility]    CSN: [id number] Arrival date & time: 01/26/18  1515     History   Chief Complaint Chief Complaint  Patient presents with  . Anxiety    HPI Phillip Gill is a 42 y.o. male.   Phillip Gill presents with complaints of episode of anxiety yesterday which has resolved. States felt shortness of breath and chest pain  Which felt similar to previous episodes of anxiety he has had. Denies any further of these symptoms. Today with headache. Pain 10/10. Light worsens headache. Behind eyes. Slightly dizzy. Has had similar headache in the past, has had migraines although not recently. No vision change. No worsening of pain with movement or sound. No nausea or vomiting. Has not taken any medications for symptoms. Has taken xanax in the past for anxiety, does not take currently. Hx of anxiety, arthritis, hep b, low back pain, fibromyalgia, hiv, htn, ibs, migraines.     ROS per HPI.      Past Medical History:  Diagnosis Date  . Anxiety   . Arthritis    "neck" (02/16/2015)  . 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 Chronic lower back pain   . Depression   . Fibromyalgia   . Genital warts   . HIV disease (HCC) 02/28/2015  . HIV infection (HCC)    followed by Dr. Luciana Axe- sees him every 4 months  . Hypertension   . IBS (irritable bowel syndrome)   . Migraine    "none in years" (02/16/2015  . Overweight 07/20/2015  . Renal insufficiency 12/29/2013    Patient Active Problem List   Diagnosis Date Noted  . Sepsis (HCC) 06/21/2017  . Flu-like symptoms   . Overweight 07/20/2015  . Colitis 03/08/2015  . Diarrhea   . HIV (human immunodeficiency virus infection) (HCC)   . Tobacco abuse   . HIV disease (HCC) 02/28/2015  . Leukocytosis   . LGV (lymphogranuloma venereum)   . Primary syphilis   . Chronic hepatitis B (HCC) 10/03/2014  . Inguinal adenopathy 10/03/2014  . Human immunodeficiency virus (HIV) disease (HCC)   . HTN (hypertension)   . Gynecomastia  04/30/2014  . 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 Chronic pain associated with significant psychosocial dysfunction 09/18/2012  . HBV (hepatitis B virus) infection 09/18/2012  . Tobacco dependence 06/03/2012  . Fibromyalgia muscle pain 02/11/2012  . Depression 12/07/2011  . Anxiety 02/02/2011    Past Surgical History:  Procedure Laterality Date  . CO2 LASER APPLICATION N/A 02/11/2013   Procedure: CO2 LASER APPLICATION;  Surgeon: Romie Levee, MD;  Location: [facility];  Service: General;  Laterality: N/A;  . HIGH RESOLUTION ANOSCOPY N/A 02/11/2013   Procedure: HIGH RESOLUTION ANOSCOPY WITH BIOPSY, LASER ABLATION;  Surgeon: Romie Levee, MD;  Location: [facility];  Service: General;  Laterality: N/A;  . WISDOM TOOTH EXTRACTION         Home Medications    Prior to Admission medications   Medication Sig Start Date End Date Taking? Authorizing Provider  amLODipine-benazepril (LOTREL) 10-20 MG capsule Take 1 capsule by mouth daily. 09/29/17  Yes Mancel Bale, MD  elvitegravir-cobicistat-emtricitabine-tenofovir (GENVOYA) 150-150-200-10 MG TABS tablet Take 1 tablet by mouth daily with breakfast. 11/20/17  Yes Veryl Speak, FNP  hydrOXYzine (ATARAX/VISTARIL) 25 MG tablet Take 1 tablet (25 mg total) by mouth every 6 (six) hours. 01/26/18   Georgetta Haber, NP    Family History Family History  Problem Relation Age of Onset  .  Hypertension Mother   . Diabetes Mother   . Stroke Mother        cerbral aneurysm  . Hypertension Brother   . Mental illness Neg Hx     Social History Social History   Tobacco Use  . Smoking status: Current Every Day Smoker    Packs/day: 0.25    Years: 18.00    Pack years: 4.50    Types: Cigarettes  . Smokeless tobacco: Never Used  Substance Use Topics  . Alcohol use: Yes    Comment: "Once in a blue moon"  . Drug use: No     Allergies   Bactrim [sulfamethoxazole w/trimethoprim (co-trimoxazole)]; Cymbalta [duloxetine  hcl]; Other; Zoloft [sertraline hcl]; and Neurontin [gabapentin]   Review of Systems Review of Systems   Physical Exam Triage Vital Signs ED Triage Vitals  Enc Vitals Group     BP 01/26/18 1621 127/87     Pulse Rate 01/26/18 1621 88     Resp 01/26/18 1621 18     Temp 01/26/18 1621 99 F (37.2 C)     Temp src --      SpO2 01/26/18 1621 100 %     Weight --      Height --      Head Circumference --      Peak Flow --      Pain Score 01/26/18 1620 0     Pain Loc --      Pain Edu? --      Excl. in GC? --    No data found.  Updated Vital Signs BP 127/87   Pulse 88   Temp 99 F (37.2 C)   Resp 18   SpO2 100%    Physical Exam  Constitutional: He is oriented to person, place, and time. He appears well-developed and well-nourished.  HENT:  Head: Normocephalic and atraumatic.  Eyes: Pupils are equal, round, and reactive to light. Conjunctivae and EOM are normal.  Cardiovascular: Normal rate and regular rhythm.  Pulmonary/Chest: Effort normal and breath sounds normal.  Neurological: He is alert and oriented to person, place, and time. No cranial nerve deficit or sensory deficit. Coordination normal.  Skin: Skin is warm and dry.  Psychiatric: He has a normal mood and affect. His behavior is normal. Thought content normal. He expresses no homicidal and no suicidal ideation.     UC Treatments / Results  Labs (all labs ordered are listed, but only abnormal results are displayed) Labs Reviewed - No data to display  EKG None  Radiology No results found.  Procedures Procedures (including critical care time)  Medications Ordered in UC Medications  ketorolac (TORADOL) injection 60 mg (60 mg Intramuscular Given 01/26/18 1732)  ondansetron (ZOFRAN-ODT) disintegrating tablet 4 mg (4 mg Oral Given 01/26/18 1732)    Initial Impression / Assessment and Plan / UC Course  I have reviewed the triage vital signs and the nursing notes.  Pertinent labs & imaging results that  were available during my care of the patient were reviewed by me and considered in my medical decision making (see chart for details).     Non toxic in appearance. Benign physical exam. Anxiety currently under control. Migraine headache treated with toradol and zofran. Vistaril provided. Encouraged follow up with PCP. Return precautions provided. Patient verbalized understanding and agreeable to plan.    Final Clinical Impressions(s) / UC Diagnoses   Final diagnoses:  Other migraine without status migrainosus, not intractable  Generalized anxiety disorder     Discharge  Instructions     Vistaril as needed for anxiety, may take tonight which may also help with headache. May cause drowsiness. Please do not take if driving or drinking alcohol.  Tylenol as needed for headache.  Please establish with a primary care provider for further evaluation and management of your anxiety. If develop worsening of symptoms please go to Er.    ED Prescriptions    Medication Sig Dispense Auth. Provider   hydrOXYzine (ATARAX/VISTARIL) 25 MG tablet Take 1 tablet (25 mg total) by mouth every 6 (six) hours. 12 tablet Georgetta Haber, NP     Controlled Substance Prescriptions [place] Controlled Substance Registry consulted? Not Applicable   Georgetta Haber, NP 01/26/18 Windell Moment

## 2018-01-26 NOTE — ED Triage Notes (Signed)
Pt presents with increased anxiety x 2 days. Denies any SI or HI. NAD in triage.

## 2018-01-29 ENCOUNTER — Encounter: Payer: Self-pay | Admitting: Family

## 2018-01-29 ENCOUNTER — Ambulatory Visit (INDEPENDENT_AMBULATORY_CARE_PROVIDER_SITE_OTHER): Payer: No Typology Code available for payment source | Admitting: Family

## 2018-01-29 VITALS — BP 142/90 | HR 76 | Temp 98.3°F | Wt 201.0 lb

## 2018-01-29 DIAGNOSIS — Z23 Encounter for immunization: Secondary | ICD-10-CM | POA: Diagnosis not present

## 2018-01-29 DIAGNOSIS — B20 Human immunodeficiency virus [HIV] disease: Secondary | ICD-10-CM

## 2018-01-29 NOTE — Assessment & Plan Note (Signed)
[name] has well-controlled HIV disease with a viral load that is undetectable and CD4 count of 1750. He is adherent with his medication regimen with no adverse side effects. He has no signs/symptoms of opportunistic infection or progressive HIV disease to history of physical exam. Meningococcal vaccination updated today. Continue current dose of Genvoya. Declines condoms today. Plan for follow-up office visit in 6 months or sooner if needed with blood work 1-2 weeks prior to appointment. Encouraged to schedule nurse visit for September to obtain flu shot.

## 2018-01-29 NOTE — Addendum Note (Signed)
Addended by: Gerarda FractionHURTADO, Littie Chiem C on: 01/29/2018 03:11 PM   Modules accepted: Orders

## 2018-01-29 NOTE — Progress Notes (Signed)
Subjective:    Patient ID: Phillip Gill, male    DOB: 02/07/1976, 42 y.o.   MRN: [id number]  Chief Complaint  Patient presents with  . HIV Positive/AIDS     HPI:  Phillip Gill is a 42 y.o. male who presents today for follow up of HIV disease.  Phillip Gill was last seen in the office on 07/31/17 and noted to have good virological control and was continued on Genvoya. Most recent blood work completed on 01/15/18 with a viral load of 44 and a CD4 count of 1750. Syphilis, chlamydia, and gonorrhea were all negative. Kidney function, liver function, electrolytes were within normal ranges. LDL was elevated at 109 with HDL being low at 36. He is due for meningococcal vaccination today.  Phillip Gill is taking his medication as prescribed with no adverse side effects. He has not missed any dosages and has no problems obtaining or taking his medications.Denies fevers, chills, night sweats, headaches, changes in vision, neck pain/stiffness, nausea, diarrhea, vomiting, lesions or rashes.   Immunization History  Administered Date(s) Administered  . Hepatitis A 01/30/2011, 05/22/2011  . Influenza Split 03/19/2012  . Influenza Whole 02/15/2011  . Influenza,inj,Quad PF,6+ Mos 04/09/2013, 04/06/2014, 02/28/2015, 04/29/2017  . Meningococcal Mcv4o 04/29/2017  . PPD Test 02/01/2011, 04/22/2015, 04/26/2015  . Pneumococcal Conjugate-13 09/25/2013  . Pneumococcal Polysaccharide-23 02/01/2011, 10/03/2014  . Tdap 07/17/2012     Allergies  Allergen Reactions  . Bactrim [Sulfamethoxazole W/Trimethoprim (Co-Trimoxazole)] Hives and Shortness Of Breath  . Cymbalta [Duloxetine Hcl] Diarrhea  . Other Hives and Swelling    [employer] toothpaste   . Zoloft [Sertraline Hcl] Diarrhea  . Neurontin [Gabapentin] Rash      Outpatient Medications Prior to Visit  Medication Sig Dispense Refill  . amLODipine-benazepril (LOTREL) 10-20 MG capsule Take 1 capsule by mouth daily. 30 capsule 1  .  elvitegravir-cobicistat-emtricitabine-tenofovir (GENVOYA) 150-150-200-10 MG TABS tablet Take 1 tablet by mouth daily with breakfast. 30 tablet 11  . hydrOXYzine (ATARAX/VISTARIL) 25 MG tablet Take 1 tablet (25 mg total) by mouth every 6 (six) hours. 12 tablet 0   No facility-administered medications prior to visit.      Past Medical History:  Diagnosis Date  . Anxiety   . Arthritis    "neck" (02/16/2015)  . 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 Chronic lower back pain   . Depression   . Fibromyalgia   . Genital warts   . HIV disease (HCC) 02/28/2015  . HIV infection (HCC)    followed by Dr. Luciana Axeomer- sees him every 4 months  . Hypertension   . IBS (irritable bowel syndrome)   . Migraine    "none in years" (02/16/2015  . Overweight 07/20/2015  . Renal insufficiency 12/29/2013     Past Surgical History:  Procedure Laterality Date  . CO2 LASER APPLICATION N/A 02/11/2013   Procedure: CO2 LASER APPLICATION;  Surgeon: Romie LeveeAlicia Thomas, MD;  [facility] ;  Service: General;  Laterality: N/A;  . HIGH RESOLUTION ANOSCOPY N/A 02/11/2013   Procedure: HIGH RESOLUTION ANOSCOPY WITH BIOPSY, LASER ABLATION;  Surgeon: Romie LeveeAlicia Thomas, MD;  [facility];  Service: General;  Laterality: N/A;  . WISDOM TOOTH EXTRACTION         Review of Systems  Constitutional: Negative for appetite change, chills, fatigue, fever and unexpected weight change.  Eyes: Negative for visual disturbance.  Respiratory: Negative for cough, chest tightness, shortness of breath and wheezing.   Cardiovascular: Negative  for chest pain and leg swelling.  Gastrointestinal: Negative for abdominal pain, constipation, diarrhea, nausea and vomiting.  Genitourinary: Negative for dysuria, flank pain, frequency, genital sores, hematuria and urgency.  Skin: Negative for rash.  Allergic/Immunologic: Negative for immunocompromised state.  Neurological: Negative for dizziness and  headaches.      Objective:    BP (!) 142/90   Pulse 76   Temp 98.3 F (36.8 C)   Wt 201 lb (91.2 kg)   BMI 26.52 kg/m  Nursing note and vital signs reviewed.  Physical Exam  Constitutional: He is oriented to person, place, and time. He appears well-developed. No distress.  HENT:  Mouth/Throat: Oropharynx is clear and moist.  Eyes: Conjunctivae are normal.  Neck: Neck supple.  Cardiovascular: Normal rate, regular rhythm, normal heart sounds and intact distal pulses. Exam reveals no gallop and no friction rub.  No murmur heard. Pulmonary/Chest: Effort normal and breath sounds normal. No respiratory distress. He has no wheezes. He has no rales. He exhibits no tenderness.  Abdominal: Soft. Bowel sounds are normal. There is no tenderness.  Lymphadenopathy:    He has no cervical adenopathy.  Neurological: He is alert and oriented to person, place, and time.  Skin: Skin is warm and dry. No rash noted.  Psychiatric: He has a normal mood and affect.       Assessment & Plan:   Problem List Items Addressed This Visit      Other   HIV disease (HCC) - Primary    Phillip Gill has well-controlled HIV disease with a viral load that is undetectable and CD4 count of 1750. He is adherent with his medication regimen with no adverse side effects. He has no signs/symptoms of opportunistic infection or progressive HIV disease to history of physical exam. Meningococcal vaccination updated today. Continue current dose of Genvoya. Declines condoms today. Plan for follow-up office visit in 6 months or sooner if needed with blood work 1-2 weeks prior to appointment. Encouraged to schedule nurse visit for September to obtain flu shot.      Relevant Orders   T-helper cell (CD4)- ([facility] clinic only)   HIV 1 RNA quant-no reflex-bld   CBC   Comprehensive metabolic panel   Lipid panel   RPR       I am having Phillip Gill maintain his amLODipine-benazepril,  elvitegravir-cobicistat-emtricitabine-tenofovir, and hydrOXYzine.   Follow-up: Return in about 6 months (around 08/01/2018), or if symptoms worsen or fail to improve.   Marcos Eke, MSN, FNP-C Nurse Practitioner [facility] Office phone: [phone] Pager: [phone] [facility] Main number: [phone]

## 2018-01-29 NOTE — Patient Instructions (Signed)
Nice to meet you.  Please continue to take your Genvoya.   Please schedule a nurse visit for a flu shot in September.  A coupon for the hydroxyzine is included with this paperwork.  Please schedule an appointment for 6 months with Dr. Daiva EvesVan Dam and blood work 1-2 weeks prior to the appointment.  Good luck in school!

## 2018-04-16 ENCOUNTER — Other Ambulatory Visit: Payer: Self-pay | Admitting: Behavioral Health

## 2018-04-16 DIAGNOSIS — B20 Human immunodeficiency virus [HIV] disease: Secondary | ICD-10-CM

## 2018-04-16 MED ORDER — ELVITEG-COBIC-EMTRICIT-TENOFAF 150-150-200-10 MG PO TABS: 1.0000 | ORAL_TABLET | Freq: Every day | ORAL | 6 refills | Status: DC

## 2018-07-09 ENCOUNTER — Other Ambulatory Visit: Payer: No Typology Code available for payment source

## 2018-07-10 ENCOUNTER — Other Ambulatory Visit: Payer: Self-pay

## 2018-07-10 DIAGNOSIS — B20 Human immunodeficiency virus [HIV] disease: Secondary | ICD-10-CM

## 2018-07-11 LAB — T-HELPER CELL (CD4) - (RCID CLINIC ONLY)
CD4 T CELL HELPER: 38 % (ref 33–55)
CD4 T Cell Abs: 1400 /uL (ref 400–2700)

## 2018-07-12 LAB — LIPID PANEL
Cholesterol: 166 mg/dL (ref <200)
HDL: 35 mg/dL — ABNORMAL LOW (ref >40)
LDL Cholesterol (Calc): 108 mg/dL (calc) — ABNORMAL HIGH
NON-HDL CHOLESTEROL (CALC): 131 mg/dL — AB (ref <130)
Total CHOL/HDL Ratio: 4.7 (calc) (ref <5.0)
Triglycerides: 118 mg/dL (ref <150)

## 2018-07-12 LAB — COMPREHENSIVE METABOLIC PANEL
AG RATIO: 1.5 (calc) (ref 1.0–2.5)
ALBUMIN MSPROF: 4.4 g/dL (ref 3.6–5.1)
ALT: 12 U/L (ref 9–46)
AST: 21 U/L (ref 10–40)
Alkaline phosphatase (APISO): 68 U/L (ref 40–115)
BUN: 11 mg/dL (ref 7–25)
CHLORIDE: 105 mmol/L (ref 98–110)
CO2: 28 mmol/L (ref 20–32)
CREATININE: 1.18 mg/dL (ref 0.60–1.35)
Calcium: 9.2 mg/dL (ref 8.6–10.3)
GLOBULIN: 3 g/dL (ref 1.9–3.7)
GLUCOSE: 116 mg/dL — AB (ref 65–99)
POTASSIUM: 4 mmol/L (ref 3.5–5.3)
SODIUM: 140 mmol/L (ref 135–146)
TOTAL PROTEIN: 7.4 g/dL (ref 6.1–8.1)
Total Bilirubin: 0.5 mg/dL (ref 0.2–1.2)

## 2018-07-12 LAB — CBC
HEMATOCRIT: 42.7 % (ref 38.5–50.0)
HEMOGLOBIN: 14.8 g/dL (ref 13.2–17.1)
MCH: 31.2 pg (ref 27.0–33.0)
MCHC: 34.7 g/dL (ref 32.0–36.0)
MCV: 89.9 fL (ref 80.0–100.0)
MPV: 11.4 fL (ref 7.5–12.5)
Platelets: 213 10*3/uL (ref 140–400)
RBC: 4.75 10*6/uL (ref 4.20–5.80)
RDW: 12.8 % (ref 11.0–15.0)
WBC: 11.3 10*3/uL — AB (ref 3.8–10.8)

## 2018-07-12 LAB — HIV-1 RNA QUANT-NO REFLEX-BLD
HIV 1 RNA QUANT: 93 {copies}/mL — AB
HIV-1 RNA Quant, Log: 1.97 Log copies/mL — ABNORMAL HIGH

## 2018-07-12 LAB — RPR: RPR: NONREACTIVE

## 2018-07-23 ENCOUNTER — Encounter: Payer: No Typology Code available for payment source | Admitting: Infectious Disease

## 2018-09-05 DIAGNOSIS — R768 Other specified abnormal immunological findings in serum: Secondary | ICD-10-CM | POA: Insufficient documentation

## 2018-09-24 ENCOUNTER — Other Ambulatory Visit: Payer: Self-pay

## 2018-09-24 ENCOUNTER — Emergency Department
Admission: EM | Admit: 2018-09-24 | Discharge: 2018-09-24 | Disposition: A | Discharge status: Home / Self Care | Payer: BC Managed Care – PPO | Attending: Emergency Medicine | Admitting: Emergency Medicine

## 2018-09-24 ENCOUNTER — Encounter: Payer: Self-pay | Admitting: Emergency Medicine

## 2018-09-24 DIAGNOSIS — I10 Essential (primary) hypertension: Secondary | ICD-10-CM | POA: Diagnosis not present

## 2018-09-24 DIAGNOSIS — J019 Acute sinusitis, unspecified: Secondary | ICD-10-CM | POA: Insufficient documentation

## 2018-09-24 DIAGNOSIS — R51 Headache: Secondary | ICD-10-CM | POA: Diagnosis present

## 2018-09-24 DIAGNOSIS — Z79899 Other long term (current) drug therapy: Secondary | ICD-10-CM | POA: Insufficient documentation

## 2018-09-24 DIAGNOSIS — F17210 Nicotine dependence, cigarettes, uncomplicated: Secondary | ICD-10-CM | POA: Diagnosis not present

## 2018-09-24 DIAGNOSIS — J329 Chronic sinusitis, unspecified: Secondary | ICD-10-CM

## 2018-09-24 MED ORDER — OXYMETAZOLINE HCL 0.05 % NA SOLN: 2.0000 | Freq: Two times a day (BID) | NASAL | 2 refills | Status: DC

## 2018-09-24 NOTE — ED Triage Notes (Signed)
Patient ambulatory to triage with steady gait, without difficulty or distress noted, mask in place; pt reports since Saturday having prod cough white sputum with runny nose; denies fever

## 2018-09-24 NOTE — ED Provider Notes (Signed)
[facility] Emergency Department Provider Note   ____________________________________________   I have reviewed the triage vital signs and the nursing notes.   HISTORY  Chief Complaint Headache  History limited by: Not Limited   HPI Phillip Gill is a 43 y.o. male who presents to the emergency department today with primary complaint of headache. Located in his forehead. He states it has been present for a couple of days. He says that it is associated with a runny nose. He has also noticed that he has had some increased sensitivity to bright lights. The patient states that he has also had a slight cough associated with white phlegm. He denies any fevers. Denies any chest pain. Denies any fevers. States that he is transferring his HIV care to [facility]. Has been taking his antiretroviral medications. Denies missing any doses.    Records reviewed. Per medical record review patient has a history of HIV  Past Medical History:  Diagnosis Date  . Anxiety   . Arthritis    "neck" (02/16/2015)  . 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 Chronic lower back pain   . Depression   . Fibromyalgia   . Genital warts   . HIV disease (HCC) 02/28/2015  . HIV infection (HCC)    followed by Dr. Luciana Axe- sees him every 4 months  . Hypertension   . IBS (irritable bowel syndrome)   . Migraine    "none in years" (02/16/2015  . Overweight 07/20/2015  . Renal insufficiency 12/29/2013    Patient Active Problem List   Diagnosis Date Noted  . Sepsis (HCC) 06/21/2017  . Flu-like symptoms   . Overweight 07/20/2015  . Colitis 03/08/2015  . Diarrhea   . HIV (human immunodeficiency virus infection) (HCC)   . Tobacco abuse   . HIV disease (HCC) 02/28/2015  . Leukocytosis   . LGV (lymphogranuloma venereum)   . Primary syphilis   . Chronic hepatitis B (HCC) 10/03/2014  . Inguinal adenopathy 10/03/2014  . Human immunodeficiency virus (HIV) disease (HCC)   . HTN  (hypertension)   . Gynecomastia 04/30/2014  . 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 Chronic pain associated with significant psychosocial dysfunction 09/18/2012  . HBV (hepatitis B virus) infection 09/18/2012  . Tobacco dependence 06/03/2012  . Fibromyalgia muscle pain 02/11/2012  . Depression 12/07/2011  . Anxiety 02/02/2011    Past Surgical History:  Procedure Laterality Date  . CO2 LASER APPLICATION N/A 02/11/2013   Procedure: CO2 LASER APPLICATION;  Surgeon: Romie Levee, MD;  Location: [facility];  Service: General;  Laterality: N/A;  . HIGH RESOLUTION ANOSCOPY N/A 02/11/2013   Procedure: HIGH RESOLUTION ANOSCOPY WITH BIOPSY, LASER ABLATION;  Surgeon: Romie Levee, MD;  Location: [facility];  Service: General;  Laterality: N/A;  . WISDOM TOOTH EXTRACTION      Prior to Admission medications   Medication Sig Start Date End Date Taking? Authorizing Provider  amLODipine-benazepril (LOTREL) 10-20 MG capsule Take 1 capsule by mouth daily. 09/29/17   Mancel Bale, MD  elvitegravir-cobicistat-emtricitabine-tenofovir (GENVOYA) 150-150-200-10 MG TABS tablet Take 1 tablet by mouth daily with breakfast. 04/16/18   Veryl Speak, FNP  hydrOXYzine (ATARAX/VISTARIL) 25 MG tablet Take 1 tablet (25 mg total) by mouth every 6 (six) hours. 01/26/18   Georgetta Haber, NP    Allergies Bactrim [sulfamethoxazole w/trimethoprim (co-trimoxazole)]; Cymbalta [duloxetine hcl]; Other; Zoloft [sertraline hcl]; and Neurontin [gabapentin]  Family History  Problem Relation Age of Onset  .  Hypertension Mother   . Diabetes Mother   . Stroke Mother        cerbral aneurysm  . Hypertension Brother   . Mental illness Neg Hx     Social History Social History   Tobacco Use  . Smoking status: Current Every Day Smoker    Packs/day: 0.25    Years: 18.00    Pack years: 4.50    Types: Cigarettes  . Smokeless tobacco: Never Used  Substance Use Topics  . Alcohol use: Yes     Comment: "Once in a blue moon"  . Drug use: No    Review of Systems Constitutional: No fever/chills Eyes: Positive for increased sensitivity to light.  ENT: Positive for runny nose.  Cardiovascular: Denies chest pain. Respiratory: Denies shortness of breath. Positive for cough. Gastrointestinal: No abdominal pain.  No nausea, no vomiting.  No diarrhea.   Genitourinary: Negative for dysuria. Musculoskeletal: Negative for back pain. Skin: Negative for rash. Neurological: Positive for headache.  ____________________________________________   PHYSICAL EXAM:  VITAL SIGNS: ED Triage Vitals  Enc Vitals Group     BP 09/24/18 0003 (!) 139/93     Pulse Rate 09/24/18 0003 (!) 102     Resp 09/24/18 0003 18     Temp 09/24/18 0003 98.1 F (36.7 C)     Temp Source 09/24/18 0003 Oral     SpO2 09/24/18 0003 97 %     Weight 09/24/18 0003 210 lb (95.3 kg)     Height 09/24/18 0003 6' (1.829 m)     Head Circumference --      Peak Flow --      Pain Score 09/24/18 0004 0   Constitutional: Alert and oriented.  Eyes: Conjunctivae are normal.  ENT      Head: Normocephalic and atraumatic.      Nose: No congestion/rhinnorhea.      Mouth/Throat: Mucous membranes are moist.      Neck: No stridor. Hematological/Lymphatic/Immunilogical: No cervical lymphadenopathy. Cardiovascular: Normal rate, regular rhythm.  No murmurs, rubs, or gallops.  Respiratory: Normal respiratory effort without tachypnea nor retractions. Breath sounds are clear and equal bilaterally. No wheezes/rales/rhonchi. Gastrointestinal: Soft and non tender. No rebound. No guarding.  Genitourinary: Deferred Musculoskeletal: Normal range of motion in all extremities. No lower extremity edema. Neurologic:  Normal speech and language. No gross focal neurologic deficits are appreciated.  Skin:  Skin is warm, dry and intact. No rash noted. Psychiatric: Mood and affect are normal. Speech and behavior are normal. Patient exhibits  appropriate insight and judgment.  ____________________________________________    LABS (pertinent positives/negatives)  None  ____________________________________________   EKG  None  ____________________________________________    RADIOLOGY  None  ____________________________________________   PROCEDURES  Procedures  ____________________________________________   INITIAL IMPRESSION / ASSESSMENT AND PLAN / ED COURSE  Pertinent labs & imaging results that were available during my care of the patient were reviewed by me and considered in my medical decision making (see chart for details).   Patient presented because of concern for headache, runny nose, light sensitivity and cough. States he has had sinusitis in the past and this reminds him of that. certainly his symptoms are consistent with sinusitis. Denies any fevers and lung auscultation without any focal or concerning findings so I doubt pneumonia. Did offer and discuss blood work and cxr with patient however he felt comfortable deferring at this time. I think this is a reasonable approach. Discussed symptomatic treatment. Doubt bacterial sinusitis given length of symptoms and lack of  fever. Discussed return precautions.   ____________________________________________   FINAL CLINICAL IMPRESSION(S) / ED DIAGNOSES  Final diagnoses:  Sinusitis, unspecified chronicity, unspecified location     Note: This dictation was prepared with Dragon dictation. Any transcriptional errors that result from this process are unintentional     Phineas Semen, MD 09/24/18 [phone]

## 2018-09-24 NOTE — Discharge Instructions (Addendum)
Please seek medical attention for any high fevers, chest pain, shortness of breath, change in behavior, persistent vomiting, bloody stool or any other new or concerning symptoms.  

## 2018-12-29 ENCOUNTER — Telehealth: Payer: Self-pay | Admitting: *Deleted

## 2018-12-29 NOTE — Telephone Encounter (Signed)
Received call in triage from [employer] advancing access, stating patient' application is about to expire.  Patient has not been seen since 12/2017, has let his ADAP expire. RN called patient, left message asking Phillip Gill to call his doctor's office if he would like to reengage in care, reapply for his financial assistance program. Patient's demographics list [facility] address. [name], RN

## 2019-01-22 ENCOUNTER — Other Ambulatory Visit: Payer: Self-pay

## 2019-01-22 ENCOUNTER — Other Ambulatory Visit (HOSPITAL_COMMUNITY)
Admission: RE | Admit: 2019-01-22 | Discharge: 2019-01-22 | Disposition: A | Discharge status: Home / Self Care | Payer: BC Managed Care – PPO | Source: Ambulatory Visit | Attending: Internal Medicine | Admitting: Internal Medicine

## 2019-01-22 ENCOUNTER — Other Ambulatory Visit: Payer: BC Managed Care – PPO

## 2019-01-22 DIAGNOSIS — B20 Human immunodeficiency virus [HIV] disease: Secondary | ICD-10-CM

## 2019-01-22 DIAGNOSIS — Z113 Encounter for screening for infections with a predominantly sexual mode of transmission: Secondary | ICD-10-CM

## 2019-01-23 LAB — T-HELPER CELL (CD4) - (RCID CLINIC ONLY)
CD4 % Helper T Cell: 39 % (ref 33–65)
CD4 T Cell Abs: 1292 /uL (ref 400–1790)

## 2019-01-23 LAB — URINE CYTOLOGY ANCILLARY ONLY
Chlamydia: NEGATIVE
Neisseria Gonorrhea: POSITIVE — AB

## 2019-01-26 ENCOUNTER — Telehealth: Payer: Self-pay

## 2019-01-26 LAB — COMPLETE METABOLIC PANEL WITH GFR
AG Ratio: 1.3 (calc) (ref 1.0–2.5)
ALT: 10 U/L (ref 9–46)
AST: 20 U/L (ref 10–40)
Albumin: 4.2 g/dL (ref 3.6–5.1)
Alkaline phosphatase (APISO): 66 U/L (ref 36–130)
BUN: 10 mg/dL (ref 7–25)
CO2: 28 mmol/L (ref 20–32)
Calcium: 9.3 mg/dL (ref 8.6–10.3)
Chloride: 105 mmol/L (ref 98–110)
Creat: 1.31 mg/dL (ref 0.60–1.35)
GFR, Est African American: 77 mL/min/{1.73_m2} (ref >=60)
GFR, Est Non African American: 67 mL/min/{1.73_m2} (ref >=60)
Globulin: 3.2 g/dL (calc) (ref 1.9–3.7)
Glucose, Bld: 118 mg/dL — ABNORMAL HIGH (ref 65–99)
Potassium: 3.5 mmol/L (ref 3.5–5.3)
Sodium: 142 mmol/L (ref 135–146)
Total Bilirubin: 0.4 mg/dL (ref 0.2–1.2)
Total Protein: 7.4 g/dL (ref 6.1–8.1)

## 2019-01-26 LAB — CBC WITH DIFFERENTIAL/PLATELET
Absolute Monocytes: 610 cells/uL (ref 200–950)
Basophils Absolute: 22 cells/uL (ref 0–200)
Basophils Relative: 0.2 %
Eosinophils Absolute: 109 cells/uL (ref 15–500)
Eosinophils Relative: 1 %
HCT: 40.9 % (ref 38.5–50.0)
Hemoglobin: 14.1 g/dL (ref 13.2–17.1)
Lymphs Abs: 3466 cells/uL (ref 850–3900)
MCH: 31.1 pg (ref 27.0–33.0)
MCHC: 34.5 g/dL (ref 32.0–36.0)
MCV: 90.1 fL (ref 80.0–100.0)
MPV: 10.9 fL (ref 7.5–12.5)
Monocytes Relative: 5.6 %
Neutro Abs: 6693 cells/uL (ref 1500–7800)
Neutrophils Relative %: 61.4 %
Platelets: 252 10*3/uL (ref 140–400)
RBC: 4.54 10*6/uL (ref 4.20–5.80)
RDW: 13.6 % (ref 11.0–15.0)
Total Lymphocyte: 31.8 %
WBC: 10.9 10*3/uL — ABNORMAL HIGH (ref 3.8–10.8)

## 2019-01-26 LAB — HIV-1 RNA QUANT-NO REFLEX-BLD
HIV 1 RNA Quant: <20 copies/mL — AB
HIV-1 RNA Quant, Log: <1.3 Log copies/mL — AB

## 2019-01-26 NOTE — Telephone Encounter (Signed)
Attempted to contact patient regarding lab work, and to schedule appointment for treatment. Unable to reach patient at this time. Left voicemail asking patient to call office back to review recent lab work. Did not disclose any information in message. [place]

## 2019-01-26 NOTE — Telephone Encounter (Signed)
Patient tested positive for Gonorrhea; will route message to MD to advise on treatment orders. Chlamydia Negative   Comment: Normal Reference Range - Negative  Neisseria gonorrhea **POSITIVE**Abnormal    Comment: Normal Reference Range - Negative   Aundria Rud, CMA

## 2019-01-26 NOTE — Telephone Encounter (Signed)
He needs treatment with ceftriaxone 250 mg IM and azithromycin 1 g by mouth.

## 2019-01-28 NOTE — Telephone Encounter (Signed)
Second attempted to contact patient regarding lab results. Left voicemail requesting a call back. Will send mychart message to patient regarding labs. Phillip Gill

## 2019-02-02 ENCOUNTER — Ambulatory Visit (INDEPENDENT_AMBULATORY_CARE_PROVIDER_SITE_OTHER): Payer: BC Managed Care – PPO

## 2019-02-02 ENCOUNTER — Other Ambulatory Visit: Payer: Self-pay

## 2019-02-02 DIAGNOSIS — A549 Gonococcal infection, unspecified: Secondary | ICD-10-CM | POA: Diagnosis not present

## 2019-02-02 MED ORDER — CEFTRIAXONE SODIUM 250 MG IJ SOLR
250.0000 mg | Freq: Once | INTRAMUSCULAR | Status: AC
  Administered 2019-02-02: 11:00:00 250 mg via INTRAMUSCULAR

## 2019-02-02 MED ORDER — AZITHROMYCIN 250 MG PO TABS: ORAL_TABLET | ORAL | 0 refills | Status: DC

## 2019-02-02 NOTE — Telephone Encounter (Signed)
Patient called back.  RN relayed results, offered an appointment. Patient coming today for treatment with 250 mg ceftriaxone IM and 1000 mg azithromycin PO once per Dr Megan Salon. [name], RN

## 2019-02-02 NOTE — Patient Instructions (Signed)
Gonorrhea Gonorrhea is a sexually transmitted disease (STD) that can affect both men and women. If left untreated, this infection can:  Damage the male or male organs.  Cause women and men to be unable to have children (be sterile).  Harm a fetus if an infected woman is pregnant. It is important to get treatment for gonorrhea as soon as possible. It is also necessary for all of your sexual partners to be tested for the infection. What are the causes? This condition is caused by bacteria called Neisseria gonorrhoeae. The infection is spread from person to person through sexual contact, including oral, anal, and vaginal sex. A newborn can contract the infection from his or her mother during birth. What increases the risk? The following factors may make you more likely to develop this condition:  Being a woman who is younger than 43 years of age and who is sexually active.  Being a woman 25 years of age or older who has: ? A new sex partner. ? More than one sex partner. ? A sex partner who has an STD.  Being a man who has: ? A new sex partner. ? More than one sex partner. ? A sex partner who has an STD.  Using condoms inconsistently.  Currently having, or having previously had, an STD.  Exchanging sex for money or drugs. What are the signs or symptoms? Some people do not have any symptoms. If you do have symptoms, they may be different for females and males. For females  Pain in the lower abdomen.  Abnormal vaginal discharge. The discharge may be cloudy, thick, or yellow-green in color.  Bleeding between periods.  Painful sex.  Burning or itching in and around the vagina.  Pain or burning when urinating.  Irritation, pain, bleeding, or discharge from the rectum. This may occur if the infection was spread by anal sex.  Sore throat or swollen lymph nodes in the neck. This may occur if the infection was spread by oral sex. For males  Abnormal discharge from the penis.  This discharge may be cloudy, thick, or yellow-green in color.  Pain or burning during urination.  Pain or swelling in the testicles.  Irritation, pain, bleeding, or discharge from the rectum. This may occur if the infection was spread by anal sex.  Sore throat, fever, or swollen lymph nodes in the neck. This may occur if the infection was spread by oral sex. How is this diagnosed? This condition is diagnosed based on:  A physical exam.  A sample of discharge that is examined under a microscope to look for the bacteria. The discharge may be taken from the urethra, cervix, throat, or rectum.  Urine tests. Not all of test results will be available during your visit. How is this treated? This condition is treated with antibiotic medicines. It is important for treatment to begin as soon as possible. Early treatment may prevent some problems from developing. Do not have sex during treatment. Avoid all types of sexual activity for 7 days after treatment is complete and until any sex partners have been treated. Follow these instructions at home:  Take over-the-counter and prescription medicines only as told by your health care provider.  Take your antibiotic medicine as told by your health care provider. Do not stop taking the antibiotic even if you start to feel better.  Do not have sex until at least 7 days after you and your partner(s) have finished treatment and your health care provider says it is okay.    It is your responsibility to get your test results. Ask your health care provider, or the department performing the test, when your results will be ready.  If you test positive for gonorrhea, inform your recent sexual partners. This includes any oral, anal, or vaginal sex partners. They need to be checked for gonorrhea even if they do not have symptoms. They may need treatment, even if they test negative for gonorrhea.  Keep all follow-up visits as told by your health care provider.  This is important. How is this prevented?   Use latex condoms correctly every time you have sexual intercourse.  Ask if your sexual partner has been tested for STDs and had negative results.  Avoid having multiple sexual partners. Contact a health care provider if:  You develop a bad reaction to the medicine you were prescribed. This may include: ? A rash. ? Nausea. ? Vomiting. ? Diarrhea.  Your symptoms do not get better after a few days of taking antibiotics.  Your symptoms get worse.  You develop new symptoms.  Your pain gets worse.  You have a fever.  You develop pain, itching, or discharge around the eyes. Get help right away if:  You feel dizzy or faint.  You have trouble breathing or have shortness of breath.  You develop an irregular heartbeat.  You have severe abdominal pain with or without shoulder pain.  You develop any bumps or sores (lesions) on your skin.  You develop warmth, redness, pain, or swelling around your joints, such as the knee. Summary  Gonorrhea is an STD that can affect both men and women.  This condition is caused by bacteria called Neisseria gonorrhoeae. The infection is spread from person to person, usually through sexual contact, including oral, anal, and vaginal sex.  Symptoms vary between males and females. Generally, they include abnormal discharge and burning during urination. Women may also experience painful sex, itching around the vagina, and bleeding between menstrual periods. Men may also experience swelling of the testicles.  This condition is treated with antibiotic medicines. Do not have sex until at least 7 days after completing antibiotic treatment.  If left untreated, gonorrhea can have serious side effects and complications. This information is not intended to replace advice given to you by your health care provider. Make sure you discuss any questions you have with your health care provider. Document Released:  06/15/2000 Document Revised: 07/25/2018 Document Reviewed: 05/18/2016 Elsevier Patient Education  2020 Elsevier Inc.  

## 2019-02-09 ENCOUNTER — Ambulatory Visit: Payer: No Typology Code available for payment source | Admitting: Internal Medicine

## 2019-02-11 ENCOUNTER — Ambulatory Visit: Payer: BC Managed Care – PPO | Admitting: Internal Medicine

## 2019-02-11 DIAGNOSIS — A549 Gonococcal infection, unspecified: Secondary | ICD-10-CM | POA: Insufficient documentation

## 2019-02-25 ENCOUNTER — Telehealth: Payer: Self-pay

## 2019-02-25 ENCOUNTER — Ambulatory Visit: Payer: BC Managed Care – PPO | Admitting: Pharmacist

## 2019-02-25 NOTE — Telephone Encounter (Addendum)
Patient call after his scheduled appointment time with Pharmacy to reschedule and to ask for refills on Genvoya. Advised patient that LPN would need to verify  Last medication refill with pharmacy before refill approval. Patient was understanding.  Eugenia Mcalpine

## 2019-03-03 ENCOUNTER — Telehealth: Payer: Self-pay | Admitting: Pharmacy Technician

## 2019-03-03 ENCOUNTER — Ambulatory Visit: Payer: BC Managed Care – PPO | Admitting: Pharmacist

## 2019-03-03 NOTE — Telephone Encounter (Signed)
[facility] Patient Advocate Encounter    Findings of the benefits investigation conducted this morning via test claims for the patient's upcoming appointment on 03/03/19 are as follows:   Insurance: Advance active commerical Estimated copay amount: $30.00 Prior Authorization: tbd  Patient is only allowed one retail fill and then must use their specialty mail order which I believe is [facility].

## 2019-03-11 ENCOUNTER — Telehealth: Payer: Self-pay

## 2019-03-11 NOTE — Telephone Encounter (Signed)
Outgoing call made to patient and rescheduled recently missed appoinmtnet with pharmacy. Patient was appreciative of phone call.  Patient verbalized understanding of new appointment date/time with [name]. Phillip Gill

## 2019-03-24 ENCOUNTER — Ambulatory Visit: Payer: BC Managed Care – PPO | Admitting: Pharmacist

## 2019-05-03 ENCOUNTER — Encounter (HOSPITAL_COMMUNITY): Payer: Self-pay

## 2019-05-03 ENCOUNTER — Other Ambulatory Visit: Payer: Self-pay

## 2019-05-03 ENCOUNTER — Emergency Department (HOSPITAL_COMMUNITY)
Admission: EM | Admit: 2019-05-03 | Discharge: 2019-05-04 | Disposition: A | Discharge status: Home / Self Care | Payer: BC Managed Care – PPO | Attending: Emergency Medicine | Admitting: Emergency Medicine

## 2019-05-03 ENCOUNTER — Emergency Department (HOSPITAL_COMMUNITY): Payer: BC Managed Care – PPO

## 2019-05-03 DIAGNOSIS — Z5321 Procedure and treatment not carried out due to patient leaving prior to being seen by health care provider: Secondary | ICD-10-CM | POA: Diagnosis not present

## 2019-05-03 DIAGNOSIS — R1110 Vomiting, unspecified: Secondary | ICD-10-CM | POA: Insufficient documentation

## 2019-05-03 DIAGNOSIS — Z20828 Contact with and (suspected) exposure to other viral communicable diseases: Secondary | ICD-10-CM | POA: Insufficient documentation

## 2019-05-03 DIAGNOSIS — R0789 Other chest pain: Secondary | ICD-10-CM | POA: Diagnosis not present

## 2019-05-03 DIAGNOSIS — R42 Dizziness and giddiness: Secondary | ICD-10-CM | POA: Diagnosis present

## 2019-05-03 LAB — CBC
HCT: 43 % (ref 39.0–52.0)
Hemoglobin: 13.9 g/dL (ref 13.0–17.0)
MCH: 30.3 pg (ref 26.0–34.0)
MCHC: 32.3 g/dL (ref 30.0–36.0)
MCV: 93.9 fL (ref 80.0–100.0)
Platelets: 228 10*3/uL (ref 150–400)
RBC: 4.58 MIL/uL (ref 4.22–5.81)
RDW: 12.5 % (ref 11.5–15.5)
WBC: 11.9 10*3/uL — ABNORMAL HIGH (ref 4.0–10.5)
nRBC: 0 % (ref 0.0–0.2)

## 2019-05-03 LAB — BASIC METABOLIC PANEL
Anion gap: 11 (ref 5–15)
BUN: 15 mg/dL (ref 6–20)
CO2: 26 mmol/L (ref 22–32)
Calcium: 9.6 mg/dL (ref 8.9–10.3)
Chloride: 102 mmol/L (ref 98–111)
Creatinine, Ser: 1.24 mg/dL (ref 0.61–1.24)
GFR calc Af Amer: >60 mL/min (ref >60)
GFR calc non Af Amer: >60 mL/min (ref >60)
Glucose, Bld: 108 mg/dL — ABNORMAL HIGH (ref 70–99)
Potassium: 3.9 mmol/L (ref 3.5–5.1)
Sodium: 139 mmol/L (ref 135–145)

## 2019-05-03 LAB — TROPONIN I (HIGH SENSITIVITY): Troponin I (High Sensitivity): 4 ng/L (ref <18)

## 2019-05-03 MED ORDER — SODIUM CHLORIDE 0.9% FLUSH: 3.0000 mL | Freq: Once | INTRAVENOUS | Status: DC

## 2019-05-03 NOTE — ED Notes (Signed)
Advised patient to stay, patient decided to leave anyway. 

## 2019-05-03 NOTE — ED Triage Notes (Signed)
Pt reports that tonight on his way to work he began to feel dizzy, vomited and then had central CP, some SOB.

## 2019-05-04 ENCOUNTER — Emergency Department (HOSPITAL_BASED_OUTPATIENT_CLINIC_OR_DEPARTMENT_OTHER)
Admission: EM | Admit: 2019-05-04 | Discharge: 2019-05-04 | Disposition: A | Discharge status: Home / Self Care | Payer: BC Managed Care – PPO | Source: Home / Self Care | Attending: Emergency Medicine | Admitting: Emergency Medicine

## 2019-05-04 ENCOUNTER — Ambulatory Visit: Payer: BC Managed Care – PPO | Admitting: Pharmacist

## 2019-05-04 ENCOUNTER — Encounter (HOSPITAL_BASED_OUTPATIENT_CLINIC_OR_DEPARTMENT_OTHER): Payer: Self-pay | Admitting: Emergency Medicine

## 2019-05-04 ENCOUNTER — Other Ambulatory Visit: Payer: Self-pay

## 2019-05-04 DIAGNOSIS — Z79899 Other long term (current) drug therapy: Secondary | ICD-10-CM | POA: Insufficient documentation

## 2019-05-04 DIAGNOSIS — R0789 Other chest pain: Secondary | ICD-10-CM

## 2019-05-04 DIAGNOSIS — Z888 Allergy status to other drugs, medicaments and biological substances status: Secondary | ICD-10-CM | POA: Insufficient documentation

## 2019-05-04 DIAGNOSIS — F17210 Nicotine dependence, cigarettes, uncomplicated: Secondary | ICD-10-CM | POA: Insufficient documentation

## 2019-05-04 DIAGNOSIS — I10 Essential (primary) hypertension: Secondary | ICD-10-CM | POA: Insufficient documentation

## 2019-05-04 DIAGNOSIS — R519 Headache, unspecified: Secondary | ICD-10-CM

## 2019-05-04 DIAGNOSIS — B20 Human immunodeficiency virus [HIV] disease: Secondary | ICD-10-CM | POA: Insufficient documentation

## 2019-05-04 DIAGNOSIS — Z886 Allergy status to analgesic agent status: Secondary | ICD-10-CM | POA: Insufficient documentation

## 2019-05-04 DIAGNOSIS — Z882 Allergy status to sulfonamides status: Secondary | ICD-10-CM | POA: Insufficient documentation

## 2019-05-04 DIAGNOSIS — M797 Fibromyalgia: Secondary | ICD-10-CM | POA: Insufficient documentation

## 2019-05-04 DIAGNOSIS — Z20828 Contact with and (suspected) exposure to other viral communicable diseases: Secondary | ICD-10-CM | POA: Insufficient documentation

## 2019-05-04 DIAGNOSIS — G44209 Tension-type headache, unspecified, not intractable: Secondary | ICD-10-CM | POA: Insufficient documentation

## 2019-05-04 LAB — HEPATIC FUNCTION PANEL
ALT: 14 U/L (ref 0–44)
AST: 24 U/L (ref 15–41)
Albumin: 4.3 g/dL (ref 3.5–5.0)
Alkaline Phosphatase: 67 U/L (ref 38–126)
Bilirubin, Direct: <0.1 mg/dL (ref 0.0–0.2)
Total Bilirubin: 0.2 mg/dL — ABNORMAL LOW (ref 0.3–1.2)
Total Protein: 7.9 g/dL (ref 6.5–8.1)

## 2019-05-04 LAB — TROPONIN I (HIGH SENSITIVITY): Troponin I (High Sensitivity): 3 ng/L (ref <18)

## 2019-05-04 LAB — LIPASE, BLOOD: Lipase: 43 U/L (ref 11–51)

## 2019-05-04 MED ORDER — ONDANSETRON 4 MG PO TBDP: 4.0000 mg | ORAL_TABLET | Freq: Three times a day (TID) | ORAL | 0 refills | Status: DC | PRN

## 2019-05-04 MED ORDER — DIPHENHYDRAMINE HCL 50 MG/ML IJ SOLN
25.0000 mg | Freq: Once | INTRAMUSCULAR | Status: AC
  Administered 2019-05-04: 25 mg via INTRAVENOUS
  Filled 2019-05-04: qty 1

## 2019-05-04 MED ORDER — PROCHLORPERAZINE EDISYLATE 10 MG/2ML IJ SOLN
10.0000 mg | Freq: Once | INTRAMUSCULAR | Status: AC
  Administered 2019-05-04: 10 mg via INTRAVENOUS
  Filled 2019-05-04: qty 2

## 2019-05-04 MED ORDER — SODIUM CHLORIDE 0.9 % IV BOLUS
1000.0000 mL | Freq: Once | INTRAVENOUS | Status: AC
  Administered 2019-05-04: 1000 mL via INTRAVENOUS

## 2019-05-04 MED ORDER — PANTOPRAZOLE SODIUM 20 MG PO TBEC: 20.0000 mg | DELAYED_RELEASE_TABLET | Freq: Every day | ORAL | 0 refills | Status: DC

## 2019-05-04 MED ORDER — DICYCLOMINE HCL 20 MG PO TABS: 20.0000 mg | ORAL_TABLET | Freq: Three times a day (TID) | ORAL | 0 refills | Status: DC | PRN

## 2019-05-04 NOTE — ED Provider Notes (Signed)
[facility] EMERGENCY DEPARTMENT Provider Note   CSN: [id number] Arrival date & time: 05/04/19  0620     History   Chief Complaint Chief Complaint  Patient presents with  . Chest Pain    HPI Phillip Gill is a 43 y.o. male.     HPI  This is a 43 year old male with a history of HIV, hypertension who presents with headache, chest pain, vomiting, and lightheadedness.  Patient states that he began having a headache last night.  He took some Tylenol.  On his way to work he felt lightheaded and had multiple episodes of nonbloody emesis.  He has not had any emesis since but states that following emesis he had lingering chest discomfort which he describes as sharp and nonradiating.  He has had a persistent headache throughout the night.  No neck pain or fevers.  No known Covid contacts.  No loss of sense of taste or smell.  He describes his dizziness as lightheadedness and not room spinning.  Denies any focal deficits.  Denies any vision changes.  Patient rates his pain at 8 out of 10.  Patient does report that he occasionally gets headaches.  Past Medical History:  Diagnosis Date  . Anxiety   . Arthritis    "neck" (02/16/2015)  . 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 Chronic lower back pain   . Depression   . Fibromyalgia   . Genital warts   . HIV disease (HCC) 02/28/2015  . HIV infection (HCC)    followed by Dr. Luciana Axeomer- sees him every 4 months  . Hypertension   . IBS (irritable bowel syndrome)   . Migraine    "none in years" (02/16/2015  . Overweight 07/20/2015  . Renal insufficiency 12/29/2013    Patient Active Problem List   Diagnosis Date Noted  . Gonorrhea 02/11/2019  . Overweight 07/20/2015  . Colitis 03/08/2015  . HIV (human immunodeficiency virus infection) (HCC)   . Tobacco abuse   . LGV (lymphogranuloma venereum)   . Primary syphilis   . Chronic hepatitis B (HCC) 10/03/2014  . HTN (hypertension)   . Gynecomastia 04/30/2014  . 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 Chronic pain associated with significant psychosocial dysfunction 09/18/2012  . Fibromyalgia muscle pain 02/11/2012  . Depression 12/07/2011  . Anxiety 02/02/2011    Past Surgical History:  Procedure Laterality Date  . CO2 LASER APPLICATION N/A 02/11/2013   Procedure: CO2 LASER APPLICATION;  Surgeon: Romie LeveeAlicia Thomas, MD;  [facility];  Service: General;  Laterality: N/A;  . HIGH RESOLUTION ANOSCOPY N/A 02/11/2013   Procedure: HIGH RESOLUTION ANOSCOPY WITH BIOPSY, LASER ABLATION;  Surgeon: Romie LeveeAlicia Thomas, MD;  Location: [facility];  Service: General;  Laterality: N/A;  . WISDOM TOOTH EXTRACTION          Home Medications    Prior to Admission medications   Medication Sig Start Date End Date Taking? Authorizing Provider  acetaminophen (TYLENOL) 325 MG tablet Take 650 mg by mouth every 6 (six) hours as needed.   Yes [provider]  amLODipine-benazepril (LOTREL) 10-20 MG capsule Take 1 capsule by mouth daily. 09/29/17   Mancel BaleWentz, Elliott, MD  azithromycin (ZITHROMAX) 250 MG tablet Take four tablets at once. 02/02/19   Cliffton Astersampbell, John, MD  elvitegravir-cobicistat-emtricitabine-tenofovir (GENVOYA) 150-150-200-10 MG TABS tablet Take 1 tablet by mouth daily with breakfast. 04/16/18   Veryl Speakalone, [name], FNP  hydrOXYzine (ATARAX/VISTARIL) 25 MG tablet Take 1 tablet (25 mg  total) by mouth every 6 (six) hours. 01/26/18   Zigmund Gottron, NP  oxymetazoline (AFRIN) 0.05 % nasal spray Place 2 sprays into both nostrils 2 (two) times daily. 09/24/18 09/24/19  Nance Pear, MD    Family History Family History  Problem Relation Age of Onset  . Hypertension Mother   . Diabetes Mother   . Stroke Mother        cerbral aneurysm  . Hypertension Brother   . Mental illness Neg Hx     Social History Social History   Tobacco Use  . Smoking status: Current Every Day Smoker    Packs/day: 0.25    Years: 18.00    Pack years: 4.50     Types: Cigarettes  . Smokeless tobacco: Never Used  Substance Use Topics  . Alcohol use: Yes    Comment: "Once in a blue moon"  . Drug use: No     Allergies   Bactrim [sulfamethoxazole w/trimethoprim (co-trimoxazole)], Cymbalta [duloxetine hcl], Other, Zoloft [sertraline hcl], and Neurontin [gabapentin]   Review of Systems Review of Systems  Constitutional: Negative for fever.  Respiratory: Negative for cough and shortness of breath.   Cardiovascular: Positive for chest pain.  Gastrointestinal: Positive for nausea and vomiting. Negative for abdominal pain.  Genitourinary: Negative for dysuria.  Neurological: Positive for dizziness and headaches.  All other systems reviewed and are negative.    Physical Exam Updated Vital Signs BP 128/90 (BP Location: Right Arm)   Pulse 77   Temp 98.1 F (36.7 C) (Oral)   Resp 18   Ht 1.829 m (6')   Wt 90.7 kg   SpO2 100%   BMI 27.12 kg/m   Physical Exam Vitals signs and nursing note reviewed.  Constitutional:      Appearance: He is well-developed. He is not ill-appearing or toxic-appearing.  HENT:     Head: Normocephalic and atraumatic.  Eyes:     Pupils: Pupils are equal, round, and reactive to light.  Neck:     Musculoskeletal: Normal range of motion and neck supple.     Comments: No meningismus Cardiovascular:     Rate and Rhythm: Normal rate and regular rhythm.     Heart sounds: Normal heart sounds. No murmur.  Pulmonary:     Effort: Pulmonary effort is normal. No respiratory distress.     Breath sounds: Normal breath sounds. No wheezing.  Abdominal:     General: Bowel sounds are normal.     Palpations: Abdomen is soft.     Tenderness: There is abdominal tenderness. There is no rebound.     Comments: Epigastric tenderness to palpation no rebound or guarding  Musculoskeletal:     Right lower leg: No edema.     Left lower leg: No edema.  Lymphadenopathy:     Cervical: No cervical adenopathy.  Skin:    General: Skin  is warm and dry.  Neurological:     Mental Status: He is alert and oriented to person, place, and time.     Comments: Cranial nerves II through XII intact, 5 5 strength in all 4 extremities, no dysmetria to finger-nose-finger  Psychiatric:        Mood and Affect: Mood normal.      ED Treatments / Results  Labs (all labs ordered are listed, but only abnormal results are displayed) Labs Reviewed  HEPATIC FUNCTION PANEL  LIPASE, BLOOD  TROPONIN I (HIGH SENSITIVITY)    EKG None  Radiology Dg Chest 2 View  Result Date: 05/03/2019  CLINICAL DATA:  Chest pain, shortness of breath EXAM: CHEST - 2 VIEW COMPARISON:  06/21/2017 FINDINGS: Heart and mediastinal contours are within normal limits. No focal opacities or effusions. No acute bony abnormality. IMPRESSION: No active cardiopulmonary disease. Electronically Signed   By: Charlett Nose M.D.   On: 05/03/2019 21:55    Procedures Procedures (including critical care time)  Medications Ordered in ED Medications  sodium chloride 0.9 % bolus 1,000 mL (has no administration in time range)  prochlorperazine (COMPAZINE) injection 10 mg (has no administration in time range)  diphenhydrAMINE (BENADRYL) injection 25 mg (has no administration in time range)     Initial Impression / Assessment and Plan / ED Course  I have reviewed the triage vital signs and the nursing notes.  Pertinent labs & imaging results that were available during my care of the patient were reviewed by me and considered in my medical decision making (see chart for details).        Patient presents with headache and nausea and vomiting.  Subsequently developed chest discomfort.  He is overall nontoxic appearing and vital signs are reassuring.  He initially presented to [facility] ED.  He did have lab work, chest x-ray, and EKG but left prior to being evaluated.  I have reviewed the results and they are largely reassuring.  Troponin was negative.  Patient describes pain  after vomiting.  He also has some epigastric tenderness.  Question reflux or vomiting related chest pain.  Doubt ACS.  Will repeat troponin since I have a baseline from last night.  Repeat EKG shows no evidence of arrhythmia or ischemia.  Low suspicion for ACS.  Additionally I have added lipase and liver function testing given abdominal tenderness.  Regarding his headache, he is nonfocal.  He is afebrile.  While he has HIV which puts him at risk, his neurologic exam is reassuring.  We will treat with Compazine and Benadryl and reassess.  Patient signed out to oncoming provider.  Final Clinical Impressions(s) / ED Diagnoses   Final diagnoses:  Acute nonintractable headache, unspecified headache type  Atypical chest pain    ED Discharge Orders    None       Shon Baton, MD 05/04/19 [phone]

## 2019-05-04 NOTE — ED Triage Notes (Signed)
Reports headache, lightheaded and vomiting since last night. States chest pain as well. Reports sharp pain in center chest.

## 2019-05-04 NOTE — ED Provider Notes (Signed)
Blood pressure 128/90, pulse 77, temperature 98.1 F (36.7 C), temperature source Oral, resp. rate 18, height 6' (1.829 m), weight 90.7 kg, SpO2 100 %.  Assuming care from Dr. Dina Rich.  In short, Phillip Gill is a 43 y.o. male with a chief complaint of Chest Pain and Headache .  Refer to the original H&P for additional details.  The current plan of care is to f/u on labs and reassess.   EKG Interpretation  Date/Time:  Monday May 04 2019 06:33:23 EST Ventricular Rate:  74 PR Interval:    QRS Duration: 95 QT Interval:  385 QTC Calculation: 428 R Axis:   31 Text Interpretation: Sinus rhythm Borderline prolonged PR interval Baseline wander in lead(s) V1 Confirmed by Thayer Jew [phone]) on 05/04/2019 6:41:24 AM      07:50 AM  Patient feeling improved after IV fluids and treatment here in the emergency department.  Lab work resulted with no acute abnormalities.  Repeat troponin is not significantly changed from his labs drawn at [facility] earlier in the evening.  EKG is reassuring.  Patient is being tested for COVID-19.  He will follow the results in MyChart and remain in social isolation until his results come back and he has been symptom-free including no fever for at least 3 days.  I will provide a work note.  Plan for symptom management at home and discussed ED return precautions.  Patient to call his PCP to schedule follow-up appointment in the next 1 to 2 weeks.  No findings concerning for ACS, PE, subarachnoid hemorrhage, meningitis.   Margette Fast, MD 05/04/19 [phone]

## 2019-05-04 NOTE — ED Notes (Signed)
Wants COVID test. EDP notified.

## 2019-05-04 NOTE — ED Notes (Signed)
Urine sample collected and placed in lab to hold.

## 2019-05-04 NOTE — Discharge Instructions (Addendum)
You were seen in the emergency department today with headache and chest discomfort.  Your lab work is reassuring.  We are testing you for COVID-19.  You are to remain in quarantine/isolation for at least the next week.  I am providing a work note to this effect.  Take the medication provided for symptom management at home.  Return to the emergency department with any new or suddenly worsening symptoms.  Please call your primary care doctor to schedule a follow-up appointment, a telehealth visit, in the next 1 to 2 weeks.  Your COVID-19 results will be made available in the MyChart app in the next 1 to 2 days.  Information regarding setting up this application can be found in the discharge paperwork.

## 2019-05-05 LAB — NOVEL CORONAVIRUS, NAA (HOSP ORDER, SEND-OUT TO REF LAB; TAT 18-24 HRS): SARS-CoV-2, NAA: NOT DETECTED

## 2019-06-15 ENCOUNTER — Telehealth: Payer: Self-pay

## 2019-06-15 NOTE — Telephone Encounter (Signed)
Left patient voice mail to call back to set up lab and provider appointment, please schedule with either Mauricio Po or Dr. Tommy Medal.

## 2019-06-16 ENCOUNTER — Telehealth: Payer: Self-pay | Admitting: *Deleted

## 2019-06-16 NOTE — Telephone Encounter (Signed)
Patient sent mychart request for an appointment. Front desk has been unable to reach him after many attempts.   Have called patient multiple times and have left messages to call back to schedule lab and office visit with [place].   ----- Message -----  From: [name], RN  Sent: 06/09/2019  9:59 AM EST  To: Cassell Smiles, Marva Panda  Subject: FW: Appointment Request               Can you please call the patient to schedule an appointment? He has MULTIPLE no shows with pharmacy and providers, but [name]'s schedule is unavailable.  Thank you,  Sharyn Lull  ----- Message -----  From: Phillip Gill  Sent: 06/09/2019  9:49 AM EST  To: [facility] Support Pool  Subject: Appointment Request                 Appointment Request From: Phillip Gill    With Provider: Alcide Evener, MD [facility]]    Preferred Date Range: 06/10/2019 - 06/16/2019    Preferred Times: Tuesday Morning, Wednesday Afternoon    Reason for visit: Office Visit    Comments:  Sixth month check up.

## 2019-06-17 ENCOUNTER — Other Ambulatory Visit: Payer: BC Managed Care – PPO

## 2019-06-17 ENCOUNTER — Other Ambulatory Visit: Payer: Self-pay

## 2019-06-17 ENCOUNTER — Encounter (HOSPITAL_COMMUNITY): Payer: Self-pay

## 2019-06-17 ENCOUNTER — Ambulatory Visit (HOSPITAL_COMMUNITY)
Admission: EM | Admit: 2019-06-17 | Discharge: 2019-06-17 | Disposition: A | Discharge status: Home / Self Care | Payer: BC Managed Care – PPO | Attending: Family Medicine | Admitting: Family Medicine

## 2019-06-17 ENCOUNTER — Other Ambulatory Visit (HOSPITAL_COMMUNITY)
Admission: RE | Admit: 2019-06-17 | Discharge: 2019-06-17 | Disposition: A | Discharge status: Home / Self Care | Payer: BC Managed Care – PPO | Source: Ambulatory Visit | Attending: Family | Admitting: Family

## 2019-06-17 DIAGNOSIS — Z113 Encounter for screening for infections with a predominantly sexual mode of transmission: Secondary | ICD-10-CM | POA: Insufficient documentation

## 2019-06-17 DIAGNOSIS — Z79899 Other long term (current) drug therapy: Secondary | ICD-10-CM

## 2019-06-17 DIAGNOSIS — L259 Unspecified contact dermatitis, unspecified cause: Secondary | ICD-10-CM

## 2019-06-17 DIAGNOSIS — B20 Human immunodeficiency virus [HIV] disease: Secondary | ICD-10-CM

## 2019-06-17 MED ORDER — PREDNISONE 10 MG (21) PO TBPK: ORAL_TABLET | Freq: Every day | ORAL | 0 refills | Status: DC

## 2019-06-17 NOTE — ED Triage Notes (Signed)
Pt presents with a rash on both left and right arm since Sunday from unknown source.

## 2019-06-18 LAB — URINE CYTOLOGY ANCILLARY ONLY
Chlamydia: NEGATIVE
Comment: NEGATIVE
Comment: NORMAL
Neisseria Gonorrhea: NEGATIVE

## 2019-06-18 LAB — T-HELPER CELL (CD4) - (RCID CLINIC ONLY)
CD4 % Helper T Cell: 37 % (ref 33–65)
CD4 T Cell Abs: 1187 /uL (ref 400–1790)

## 2019-06-20 NOTE — ED Provider Notes (Signed)
[facility]   [id number] 06/17/19 Arrival Time: 1358  ASSESSMENT & PLAN:  1. Contact dermatitis, unspecified contact dermatitis type, unspecified trigger     No signs of infectious etiology. Discussed.  Begin trial of: Meds ordered this encounter  Medications  . predniSONE (STERAPRED UNI-PAK 21 TAB) 10 MG (21) TBPK tablet    Sig: Take by mouth daily. Take as directed.    Dispense:  21 tablet    Refill:  0    Will follow up with PCP or here if worsening or failing to improve as anticipated. Reviewed expectations re: course of current medical issues. Questions answered. Outlined signs and symptoms indicating need for more acute intervention. Patient verbalized understanding. After Visit Summary given.   SUBJECTIVE:  Phillip Gill is a 43 y.o. male who presents with a skin complaint.   Location: bilateral forearms Onset: gradual Duration: several days Associated pruritis? moderate Associated pain? none Progression: stable  Drainage? none Known trigger? No  New soaps/lotions/topicals/detergents? No  Environmental exposures? No  Contacts with similar? No  Recent travel? No  Other associated symptoms: none Therapies tried thus far: none Arthralgia or myalgia? none Recent illness? none Fever? none New medications? none No specific aggravating or alleviating factors reported. No h/o similar reported.  ROS: As per HPI.  OBJECTIVE: Vitals:   06/17/19 1454  BP: 121/85  Pulse: 83  Resp: 17  Temp: 98.2 F (36.8 C)  TempSrc: Oral  SpO2: 99%    General appearance: alert; no distress HEENT: [place]; AT Neck: supple with FROM Lungs: clear to auscultation bilaterally Heart: regular rate and rhythm Extremities: no edema; moves all extremities normally Skin: warm and dry; signs of infection: no; several scattered raised erythematous areas of skin over bilateral forearms; mild confluence; no drainage or bleeding Psychological: alert and cooperative;  normal mood and affect  Allergies  Allergen Reactions  . Bactrim [Sulfamethoxazole W/Trimethoprim (Co-Trimoxazole)] Hives and Shortness Of Breath  . Cymbalta [Duloxetine Hcl] Diarrhea  . Other Hives and Swelling    [employer] toothpaste   . Zoloft [Sertraline Hcl] Diarrhea  . Neurontin [Gabapentin] Rash    Past Medical History:  Diagnosis Date  . Anxiety   . Arthritis    "neck" (02/16/2015)  . 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 Chronic lower back pain   . Depression   . Fibromyalgia   . Genital warts   . HIV disease (HCC) 02/28/2015  . HIV infection (HCC)    followed by Dr. Luciana Axe- sees him every 4 months  . Hypertension   . IBS (irritable bowel syndrome)   . Migraine    "none in years" (02/16/2015  . Overweight 07/20/2015  . Renal insufficiency 12/29/2013   Social History   Socioeconomic History  . Marital status: Single    Spouse name: Not on file  . Number of children: Not on file  . Years of education: Not on file  . Highest education level: Not on file  Occupational History  . Not on file  Tobacco Use  . Smoking status: Current Every Day Smoker    Packs/day: 0.25    Years: 18.00    Pack years: 4.50    Types: Cigarettes  . Smokeless tobacco: Never Used  Substance and Sexual Activity  . Alcohol use: Yes    Comment: "Once in a blue moon"  . Drug use: No  . Sexual activity: Not on file  Other Topics Concern  . 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    Finished HS.   Doing online classes with [employer]   Previously 2 years of classes at [employer].    Has Partner- [name]- together for 1 year.    Has 2 girls- (born in 2000).          Social Determinants of Health   Financial Resource Strain:   . Difficulty of Paying Living Expenses: Not on file  Food Insecurity:   . Worried About Charity fundraiser in the Last Year:  Not on file  . Ran Out of Food in the Last Year: Not on file  Transportation Needs:   . Lack of Transportation (Medical): Not on file  . Lack of Transportation (Non-Medical): Not on file  Physical Activity:   . Days of Exercise per Week: Not on file  . Minutes of Exercise per Session: Not on file  Stress:   . Feeling of Stress : Not on file  Social Connections:   . Frequency of Communication with Friends and Family: Not on file  . Frequency of Social Gatherings with Friends and Family: Not on file  . Attends Religious Services: Not on file  . Active Member of Clubs or Organizations: Not on file  . Attends Archivist Meetings: Not on file  . Marital Status: Not on file  Intimate Partner Violence:   . Fear of Current or Ex-Partner: Not on file  . Emotionally Abused: Not on file  . Physically Abused: Not on file  . Sexually Abused: Not on file   Family History  Problem Relation Age of Onset  . Hypertension Mother   . Diabetes Mother   . Stroke Mother        cerbral aneurysm  . Hypertension Brother   . Mental illness Neg Hx    Past Surgical History:  Procedure Laterality Date  . CO2 LASER APPLICATION N/A 1/95/0932   Procedure: CO2 LASER APPLICATION;  Surgeon: Leighton Ruff, MD;  [facility]: [facility];  Service: General;  Laterality: N/A;  . HIGH RESOLUTION ANOSCOPY N/A 02/11/2013   Procedure: HIGH RESOLUTION ANOSCOPY WITH BIOPSY, LASER ABLATION;  Surgeon: Leighton Ruff, MD;  Location: [facility];  Service: General;  Laterality: N/A;  . WISDOM TOOTH EXTRACTION       Vanessa Kick, MD 06/20/19 [phone]

## 2019-06-27 LAB — CBC WITH DIFFERENTIAL/PLATELET
Absolute Monocytes: 510 cells/uL (ref 200–950)
Basophils Absolute: 40 cells/uL (ref 0–200)
Basophils Relative: 0.4 %
Eosinophils Absolute: 90 cells/uL (ref 15–500)
Eosinophils Relative: 0.9 %
HCT: 44.7 % (ref 38.5–50.0)
Hemoglobin: 14.9 g/dL (ref 13.2–17.1)
Lymphs Abs: 3320 cells/uL (ref 850–3900)
MCH: 30.2 pg (ref 27.0–33.0)
MCHC: 33.3 g/dL (ref 32.0–36.0)
MCV: 90.7 fL (ref 80.0–100.0)
MPV: 10.9 fL (ref 7.5–12.5)
Monocytes Relative: 5.1 %
Neutro Abs: 6040 cells/uL (ref 1500–7800)
Neutrophils Relative %: 60.4 %
Platelets: 238 10*3/uL (ref 140–400)
RBC: 4.93 10*6/uL (ref 4.20–5.80)
RDW: 13 % (ref 11.0–15.0)
Total Lymphocyte: 33.2 %
WBC: 10 10*3/uL (ref 3.8–10.8)

## 2019-06-27 LAB — LIPID PANEL
Cholesterol: 204 mg/dL — ABNORMAL HIGH (ref <200)
HDL: 40 mg/dL (ref >=40)
LDL Cholesterol (Calc): 142 mg/dL (calc) — ABNORMAL HIGH
Non-HDL Cholesterol (Calc): 164 mg/dL (calc) — ABNORMAL HIGH (ref <130)
Total CHOL/HDL Ratio: 5.1 (calc) — ABNORMAL HIGH (ref <5.0)
Triglycerides: 105 mg/dL (ref <150)

## 2019-06-27 LAB — COMPREHENSIVE METABOLIC PANEL
AG Ratio: 1.4 (calc) (ref 1.0–2.5)
ALT: 10 U/L (ref 9–46)
AST: 17 U/L (ref 10–40)
Albumin: 4.5 g/dL (ref 3.6–5.1)
Alkaline phosphatase (APISO): 65 U/L (ref 36–130)
BUN: 13 mg/dL (ref 7–25)
CO2: 31 mmol/L (ref 20–32)
Calcium: 9.7 mg/dL (ref 8.6–10.3)
Chloride: 104 mmol/L (ref 98–110)
Creat: 1.22 mg/dL (ref 0.60–1.35)
Globulin: 3.2 g/dL (calc) (ref 1.9–3.7)
Glucose, Bld: 82 mg/dL (ref 65–99)
Potassium: 4.6 mmol/L (ref 3.5–5.3)
Sodium: 140 mmol/L (ref 135–146)
Total Bilirubin: 0.3 mg/dL (ref 0.2–1.2)
Total Protein: 7.7 g/dL (ref 6.1–8.1)

## 2019-06-27 LAB — RPR: RPR Ser Ql: NONREACTIVE

## 2019-06-27 LAB — HIV-1 RNA QUANT-NO REFLEX-BLD
HIV 1 RNA Quant: <20 copies/mL — AB
HIV-1 RNA Quant, Log: <1.3 Log copies/mL — AB

## 2019-07-06 ENCOUNTER — Encounter: Payer: BC Managed Care – PPO | Admitting: Family

## 2019-07-07 ENCOUNTER — Telehealth: Payer: Self-pay

## 2019-07-07 NOTE — Telephone Encounter (Signed)
Attempted to call patient to reschedule missed appointment on 07/06/19 with Phillip Eke, NP. The time has also come for patient to renew RW/UMAP. Also sent patient MyChart message Phillip Gill

## 2019-07-09 ENCOUNTER — Telehealth: Payer: Self-pay

## 2019-07-09 NOTE — Telephone Encounter (Signed)
Left patient a voice mail to call back to schedule a follow up appointment with Dr. Daiva Eves.

## 2019-07-15 ENCOUNTER — Telehealth: Payer: Self-pay

## 2019-07-15 NOTE — Telephone Encounter (Signed)
Left patient a voice mail to call back to schedule an appointment with Cassi due to multiple missed appointments.

## 2019-08-25 ENCOUNTER — Other Ambulatory Visit: Payer: Self-pay

## 2019-08-25 ENCOUNTER — Ambulatory Visit (INDEPENDENT_AMBULATORY_CARE_PROVIDER_SITE_OTHER): Payer: BC Managed Care – PPO | Admitting: Family

## 2019-08-25 ENCOUNTER — Encounter: Payer: Self-pay | Admitting: Family

## 2019-08-25 VITALS — BP 131/88 | HR 89 | Temp 98.0°F | Ht 72.0 in | Wt 199.0 lb

## 2019-08-25 DIAGNOSIS — B20 Human immunodeficiency virus [HIV] disease: Secondary | ICD-10-CM | POA: Diagnosis not present

## 2019-08-25 DIAGNOSIS — Z720 Tobacco use: Secondary | ICD-10-CM | POA: Diagnosis not present

## 2019-08-25 DIAGNOSIS — A630 Anogenital (venereal) warts: Secondary | ICD-10-CM | POA: Diagnosis not present

## 2019-08-25 DIAGNOSIS — B181 Chronic viral hepatitis B without delta-agent: Secondary | ICD-10-CM

## 2019-08-25 DIAGNOSIS — Z113 Encounter for screening for infections with a predominantly sexual mode of transmission: Secondary | ICD-10-CM

## 2019-08-25 MED ORDER — ELVITEG-COBIC-EMTRICIT-TENOFAF 150-150-200-10 MG PO TABS: 1.0000 | ORAL_TABLET | Freq: Every day | ORAL | 6 refills | Status: DC

## 2019-08-25 MED ORDER — BUPROPION HCL ER (SR) 150 MG PO TB12: ORAL_TABLET | ORAL | 2 refills | Status: DC

## 2019-08-25 NOTE — Assessment & Plan Note (Signed)
Mr. Vanwingerden is in the action stage of change wishing to try Zyban to help with tobacco cessation and currently smokes approximately 1/4 pack of cigarettes per day on average.  Zyban sent to pharmacy.

## 2019-08-25 NOTE — Assessment & Plan Note (Signed)
HIV appears adequately controlled with current regimen of Genvoya.  No signs/symptoms of opportunistic infection or progressive HIV disease.  We reviewed previous lab work and discussed plan of care.  Continue current dose of Genvoya.  Plan for follow-up in 4 months or sooner if needed.

## 2019-08-25 NOTE — Progress Notes (Signed)
Subjective:    Patient ID: Phillip Gill, male    DOB: 20-Dec-1975, 44 y.o.   MRN: [id number]  Chief Complaint  Patient presents with  . Follow-up    Pt stated--anal warts---discharge blood spot--3-4 years ago.     HPI:  Phillip Gill is a 44 y.o. male .  With HIV disease who was last seen in the office on 01/29/2018 with good adherence and tolerance to his ART regimen of Genvoya.  Blood work at the time showed a viral load that was undetectable and CD4 count of 1750.  Most recent blood work completed on 06/17/2019 with a viral load that remains undetectable and CD4 count 1187.  Testing for STIs was negative.  Renal function, electrolytes, and liver function were normal.  Phillip Gill continues to take his Genvoya as prescribed with 3-4 missed doses since his last office visit.  No adverse side effects.  Overall feeling well today.  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  He has also noted a small amount of blood with bowel movements recently that only occurs with bowel movements and stops.  No rectal pain or bleeding present.  Phillip Gill has no problems obtaining his medication from the pharmacy and remains covered through [facility].  Denies feelings of being down, depressed, or hopeless recently.  He does smoke approximately 1/2 pack of cigarettes per day and is interested in quitting with previously failed attempts with Chantix and is interested in Zyban.  No recreational or illicit drug use and occasional alcohol consumption.  Not currently sexually active.  He is due for dental screening. 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    Allergies  Allergen Reactions  . Bactrim [Sulfamethoxazole W/Trimethoprim (Co-Trimoxazole)] Hives and Shortness Of Breath  . Cymbalta [Duloxetine Hcl] Diarrhea  . Other Hives and Swelling    Colgate toothpaste   . Zoloft [Sertraline Hcl]  Diarrhea  . Neurontin [Gabapentin] Rash      Outpatient Medications Prior to Visit  Medication Sig Dispense Refill  . amLODipine-benazepril (LOTREL) 10-20 MG capsule Take 1 capsule by mouth daily. 30 capsule 1  . elvitegravir-cobicistat-emtricitabine-tenofovir (GENVOYA) 150-150-200-10 MG TABS tablet Take 1 tablet by mouth daily with breakfast. 30 tablet 6  . acetaminophen (TYLENOL) 325 MG tablet Take 650 mg by mouth every 6 (six) hours as needed.    . pantoprazole (PROTONIX) 20 MG tablet Take 1 tablet (20 mg total) by mouth daily. 30 tablet 0  . dicyclomine (BENTYL) 20 MG tablet Take 1 tablet (20 mg total) by mouth 3 (three) times daily as needed for spasms (abdominal cramping). (Patient not taking: Reported on 08/25/2019) 20 tablet 0  . hydrOXYzine (ATARAX/VISTARIL) 25 MG tablet Take 1 tablet (25 mg total) by mouth every 6 (six) hours. (Patient not taking: Reported on 08/25/2019) 12 tablet 0  . ondansetron (ZOFRAN ODT) 4 MG disintegrating tablet Take 1 tablet (4 mg total) by mouth every 8 (eight) hours as needed. (Patient not taking: Reported on 08/25/2019) 20 tablet 0  . oxymetazoline (AFRIN) 0.05 % nasal spray Place 2 sprays into both nostrils 2 (two) times daily. (Patient not taking: Reported on 08/25/2019) 15 mL 2  . predniSONE (STERAPRED UNI-PAK 21 TAB) 10 MG (21) TBPK tablet Take by mouth daily. Take as directed. (Patient not taking: Reported on 08/25/2019) 21 tablet 0   No facility-administered medications prior to visit.  Past Medical History:  Diagnosis Date  . Anxiety   . Arthritis    "neck" (02/16/2015)  . 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 Chronic lower back pain   . Depression   . Fibromyalgia   . Genital warts   . HIV disease ([place]) 02/28/2015  . HIV infection ([place])    followed by Dr. Linus Salmons- sees him every 4 months  . Hypertension   . IBS (irritable bowel syndrome)   . Migraine    "none in years" (02/16/2015  . Overweight 07/20/2015  . Renal  insufficiency 12/29/2013     Past Surgical History:  Procedure Laterality Date  . CO2 LASER APPLICATION N/A 7/89/3810   Procedure: CO2 LASER APPLICATION;  Surgeon: Leighton Ruff, MD;  Location: [facility];  Service: General;  Laterality: N/A;  . HIGH RESOLUTION ANOSCOPY N/A 02/11/2013   Procedure: HIGH RESOLUTION ANOSCOPY WITH BIOPSY, LASER ABLATION;  Surgeon: Leighton Ruff, MD;  Location: [facility];  Service: General;  Laterality: N/A;  . WISDOM TOOTH EXTRACTION       Review of Systems  Constitutional: Negative for appetite change, chills, fatigue, fever and unexpected weight change.  Eyes: Negative for visual disturbance.  Respiratory: Negative for cough, chest tightness, shortness of breath and wheezing.   Cardiovascular: Negative for chest pain and leg swelling.  Gastrointestinal: Negative for abdominal pain, constipation, diarrhea, nausea and vomiting.  Genitourinary: Negative for dysuria, flank pain, frequency, genital sores, hematuria and urgency.  Skin: Negative for rash.  Allergic/Immunologic: Negative for immunocompromised state.  Neurological: Negative for dizziness and headaches.      Objective:    BP 131/88   Pulse 89   Temp 98 F (36.7 C)   Ht 6' (1.829 m)   Wt 199 lb (90.3 kg)   SpO2 97%   BMI 26.99 kg/m  Nursing note and vital signs reviewed.  Physical Exam Constitutional:      General: He is not in acute distress.    Appearance: He is well-developed.  Cardiovascular:     Rate and Rhythm: Normal rate and regular rhythm.     Heart sounds: Normal heart sounds.  Pulmonary:     Effort: Pulmonary effort is normal.     Breath sounds: Normal breath sounds.  Genitourinary:    Comments: Anal condyloma present.  Skin:    General: Skin is warm and dry.  Neurological:     Mental Status: He is alert and oriented to person, place, and time.  Psychiatric:        Behavior: Behavior normal.        Thought Content: Thought content  normal.        Judgment: Judgment normal.      Depression screen [facility] 2/9 01/29/2018 07/31/2017 04/29/2017 07/20/2015 07/20/2015  Decreased Interest 0 1 1 (No Data) 0  Down, Depressed, Hopeless 0 1 1 - 1  PHQ - 2 Score 0 2 2 - 1  Altered sleeping - 0 1 - -  Tired, decreased energy - 1 2 - -  Change in appetite - 0 1 - -  Feeling bad or failure about yourself  - 1 3 - -  Trouble concentrating - 0 1 - -  Moving slowly or fidgety/restless - 0 0 - -  Suicidal thoughts - 0 0 - -  PHQ-9 Score - 4 10 - -  Difficult doing work/chores - Somewhat difficult Somewhat difficult - -  Some recent data might be hidden  Assessment & Plan:    Patient Active Problem List   Diagnosis Date Noted  . Human immunodeficiency virus (HIV) disease (HCC) 08/25/2019  . Gonorrhea 02/11/2019  . Overweight 07/20/2015  . Colitis 03/08/2015  . HIV (human immunodeficiency virus infection) (HCC)   . Tobacco abuse   . LGV (lymphogranuloma venereum)   . Primary syphilis   . Chronic hepatitis B (HCC) 10/03/2014  . HTN (hypertension)   . Gynecomastia 04/30/2014  . 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 Chronic pain associated with significant psychosocial dysfunction 09/18/2012  . Fibromyalgia muscle pain 02/11/2012  . Depression 12/07/2011  . Anxiety 02/02/2011     Problem List Items Addressed This Visit      Digestive   Anal condyloma (Chronic)    Phillip Gill continues to have anal condyloma that have been refractory to Aldara cream. Discussed treatment options and will refer to [facility] ID as it would be beneficial for him to have anal Pap as well.       Relevant Medications   elvitegravir-cobicistat-emtricitabine-tenofovir (GENVOYA) 150-150-200-10 MG TABS tablet   Other Relevant Orders   Ambulatory referral to Infectious Disease   Chronic hepatitis B (HCC) (Chronic)    Check Hepatitis B status with next blood work. Currently on Genvoya with Tenofovir covering Hepatitis B. Discussed importance of  continuation of medication as stopping abruptly could result in Hepatitis B flare.       Relevant Medications   elvitegravir-cobicistat-emtricitabine-tenofovir (GENVOYA) 150-150-200-10 MG TABS tablet   Other Relevant Orders   Hepatitis B DNA, ultraquantitative, PCR   Hepatitis B e antibody   Hepatitis B e antigen   Hepatitis B surface antigen   Hepatitis B surface antibody,qualitative   Hepatitis B core antibody, IgM     Other   Human immunodeficiency virus (HIV) disease (HCC) - Primary (Chronic)    HIV appears adequately controlled with current regimen of Genvoya.  No signs/symptoms of opportunistic infection or progressive HIV disease.  We reviewed previous lab work and discussed plan of care.  Continue current dose of Genvoya.  Plan for follow-up in 4 months or sooner if needed.       Relevant Medications   elvitegravir-cobicistat-emtricitabine-tenofovir (GENVOYA) 150-150-200-10 MG TABS tablet   Other Relevant Orders   HIV-1 RNA quant-no reflex-bld   CBC   Comprehensive metabolic panel   HIV (human immunodeficiency virus infection) (HCC)   Relevant Medications   elvitegravir-cobicistat-emtricitabine-tenofovir (GENVOYA) 150-150-200-10 MG TABS tablet   Tobacco abuse    Phillip Gill is in the action stage of change wishing to try Zyban to help with tobacco cessation and currently smokes approximately 1/4 pack of cigarettes per day on average.  Zyban sent to pharmacy.       Other Visit Diagnoses    Screening for STDs (sexually transmitted diseases)       Relevant Orders   RPR       I have discontinued Phillip Gill's hydrOXYzine, oxymetazoline, ondansetron, dicyclomine, and predniSONE. I am also having him start on buPROPion. Additionally, I am having him maintain his amLODipine-benazepril, acetaminophen, pantoprazole, and elvitegravir-cobicistat-emtricitabine-tenofovir.   Meds ordered this encounter  Medications  . elvitegravir-cobicistat-emtricitabine-tenofovir  (GENVOYA) 150-150-200-10 MG TABS tablet    Sig: Take 1 tablet by mouth daily with breakfast.    Dispense:  30 tablet    Refill:  6    Order Specific Question:   Supervising Provider    Answer:   Judyann Munson [[id number]]  . buPROPion (WELLBUTRIN SR) 150 MG 12 hr tablet  Sig: Take 1 tablet by mouth daily for 3 days then increase to 1 tablet by mouth twice daily. Plan to quit smoking during second week and continue for 12 weeks beyond quit date.    Dispense:  60 tablet    Refill:  2    Order Specific Question:   Supervising Provider    Answer:   Judyann Munson [[id number]]     Follow-up: Return in about 4 months (around 12/23/2019).   Marcos Eke, MSN, FNP-C Nurse Practitioner [facility] for [facility]ious Disease [facility] Main number: [phone]

## 2019-08-25 NOTE — Assessment & Plan Note (Deleted)
HIV appears adequately controlled with current regimen of Genvoya.  No signs/symptoms of opportunistic infection or progressive HIV disease.  We reviewed previous lab work and discussed plan of care.  Continue current dose of Genvoya.  Plan for follow-up in 3 months or sooner if needed.

## 2019-08-25 NOTE — Patient Instructions (Signed)
Nice to see you.  We will continue your Genvoya.  Refills have been sent to the pharmacy.  A referral has been placed to [facility].   Plan for follow up in 4 months or sooner if needed with lab work 1-2 weeks prior to appointment.   Have a great day and stay safe!

## 2019-08-25 NOTE — Assessment & Plan Note (Signed)
Check Hepatitis B status with next blood work. Currently on Genvoya with Tenofovir covering Hepatitis B. Discussed importance of continuation of medication as stopping abruptly could result in Hepatitis B flare.

## 2019-08-25 NOTE — Assessment & Plan Note (Signed)
Mr. Cobbins continues to have anal condyloma that have been refractory to Aldara cream. Discussed treatment options and will refer to [facility] ID as it would be beneficial for him to have anal Pap as well.

## 2019-09-14 ENCOUNTER — Telehealth: Payer: Self-pay

## 2019-09-14 NOTE — Telephone Encounter (Signed)
Patient called office requesting update on referral to [facility]. Referral Coordinator states that patient information was faxed on 2/23, and is waiting on their office to call patient. Patient was informed. Understands [facility] will call with an appointment. Lorenso Courier, [place]

## 2019-12-01 ENCOUNTER — Other Ambulatory Visit: Payer: BC Managed Care – PPO

## 2019-12-10 ENCOUNTER — Other Ambulatory Visit: Payer: Self-pay

## 2019-12-10 ENCOUNTER — Other Ambulatory Visit: Payer: BC Managed Care – PPO

## 2019-12-10 DIAGNOSIS — B20 Human immunodeficiency virus [HIV] disease: Secondary | ICD-10-CM

## 2019-12-10 DIAGNOSIS — Z113 Encounter for screening for infections with a predominantly sexual mode of transmission: Secondary | ICD-10-CM

## 2019-12-12 LAB — COMPREHENSIVE METABOLIC PANEL
AG Ratio: 1.6 (calc) (ref 1.0–2.5)
ALT: 22 U/L (ref 9–46)
AST: 24 U/L (ref 10–40)
Albumin: 4.4 g/dL (ref 3.6–5.1)
Alkaline phosphatase (APISO): 60 U/L (ref 36–130)
BUN/Creatinine Ratio: 13 (calc) (ref 6–22)
BUN: 18 mg/dL (ref 7–25)
CO2: 30 mmol/L (ref 20–32)
Calcium: 9.7 mg/dL (ref 8.6–10.3)
Chloride: 104 mmol/L (ref 98–110)
Creat: 1.37 mg/dL — ABNORMAL HIGH (ref 0.60–1.35)
Globulin: 2.8 g/dL (calc) (ref 1.9–3.7)
Glucose, Bld: 93 mg/dL (ref 65–99)
Potassium: 4.4 mmol/L (ref 3.5–5.3)
Sodium: 141 mmol/L (ref 135–146)
Total Bilirubin: 0.3 mg/dL (ref 0.2–1.2)
Total Protein: 7.2 g/dL (ref 6.1–8.1)

## 2019-12-12 LAB — CBC
HCT: 39.1 % (ref 38.5–50.0)
Hemoglobin: 13 g/dL — ABNORMAL LOW (ref 13.2–17.1)
MCH: 30 pg (ref 27.0–33.0)
MCHC: 33.2 g/dL (ref 32.0–36.0)
MCV: 90.1 fL (ref 80.0–100.0)
MPV: 10.6 fL (ref 7.5–12.5)
Platelets: 206 10*3/uL (ref 140–400)
RBC: 4.34 10*6/uL (ref 4.20–5.80)
RDW: 13.8 % (ref 11.0–15.0)
WBC: 9.8 10*3/uL (ref 3.8–10.8)

## 2019-12-12 LAB — HIV-1 RNA QUANT-NO REFLEX-BLD
HIV 1 RNA Quant: <20 copies/mL — AB
HIV-1 RNA Quant, Log: <1.3 Log copies/mL — AB

## 2019-12-12 LAB — RPR: RPR Ser Ql: NONREACTIVE

## 2019-12-17 ENCOUNTER — Encounter: Payer: BC Managed Care – PPO | Admitting: Family

## 2019-12-24 ENCOUNTER — Ambulatory Visit (INDEPENDENT_AMBULATORY_CARE_PROVIDER_SITE_OTHER): Payer: BC Managed Care – PPO | Admitting: Family

## 2019-12-24 ENCOUNTER — Other Ambulatory Visit: Payer: Self-pay

## 2019-12-24 ENCOUNTER — Encounter: Payer: Self-pay | Admitting: Family

## 2019-12-24 VITALS — BP 126/88 | HR 101 | Temp 98.1°F | Wt 211.0 lb

## 2019-12-24 DIAGNOSIS — B20 Human immunodeficiency virus [HIV] disease: Secondary | ICD-10-CM

## 2019-12-24 DIAGNOSIS — L989 Disorder of the skin and subcutaneous tissue, unspecified: Secondary | ICD-10-CM

## 2019-12-24 DIAGNOSIS — B181 Chronic viral hepatitis B without delta-agent: Secondary | ICD-10-CM

## 2019-12-24 DIAGNOSIS — Z113 Encounter for screening for infections with a predominantly sexual mode of transmission: Secondary | ICD-10-CM

## 2019-12-24 MED ORDER — ELVITEG-COBIC-EMTRICIT-TENOFAF 150-150-200-10 MG PO TABS: 1.0000 | ORAL_TABLET | Freq: Every day | ORAL | 6 refills | Status: DC

## 2019-12-24 NOTE — Assessment & Plan Note (Signed)
Currently suppressed with Genvoya. Asymptomatic and no recent flares. Will recheck Hepatitis B lab work with next blood work.

## 2019-12-24 NOTE — Patient Instructions (Addendum)
Nice to see you.  We will continue your current dose of Genvoya daily.  Refills have been sent to the pharmacy.  A referral to podiatry will be placed for your foot.   Follow up with [facility] for your test results:  Verneita Griffes, NP  [facility] [place], [place] [address]  [phone]  [phone] (Fax)   Plan for follow up in 4 months or sooner if needed with lab work 1-2 weeks prior to your appointment.   Have a great day and stay safe!

## 2019-12-24 NOTE — Assessment & Plan Note (Signed)
Phillip Gill continues to have well controlled HIV disease with good adherence and tolerance to his ART regimen of Genvoya. No signs/symtpoms of opportunistic infection or progressive HIV disease. Reviewed lab work and discussed the plan of care. Continue current dose of Genvoya. Plan for follow up in 4 months or sooner if needed with lab work 1-2 weeks prior to your appointment.

## 2019-12-24 NOTE — Assessment & Plan Note (Signed)
Mr. Man has a dime sized lesion located on the medial plantar aspect of his right foot which appears like a ganglion type cyst. Recommend OTC donut padding as needed for pressure relief. Will refer to podiatry for further assessment and treatment.

## 2019-12-24 NOTE — Progress Notes (Signed)
Subjective:    Patient ID: Phillip Gill, male    DOB: 08-22-1975, 44 y.o.   MRN: [id number]  Chief Complaint  Patient presents with  . Follow-up    B20     HPI:  Phillip Gill is a 44 y.o. male with HIV disease and chronic Hepatitis B who was last seen in the office on 08/25/19 with a viral load that was undetectable and CD4 count of 1187. Most recent blood work completed on 12/10/19 with viral load that remains undetectable. RPR negative for Syphilis. Will need Hepatitis B lab work completed. Here today for routine follow up.   Phillip Gill has been taking his Genvoya daily as prescribed with no adverse side effects.  He has missed several days secondary to running out of medication recently.  Overall feeling well today with concern for an area located on the plantar aspect of his left foot that has been going on for approximately 1 to 2 years and is painful at times after standing on it for long periods of time.  No trauma or injury.  Has not attempted any treatments besides padding with over-the-counter sources.  Would like to see podiatry. Denies fevers, chills, night sweats, headaches, changes in vision, neck pain/stiffness, nausea, diarrhea, vomiting, lesions or rashes.   Phillip Gill has no problems obtaining his medication from the pharmacy and remains covered through [facility].  Denies feelings of being down, depressed, or hopeless recently.  No recreational or illicit drug use.  Does continue to smoke tobacco regularly.  Currently sexually active and requesting condoms.  Has received Pfizer Covid vaccine series.   Allergies  Allergen Reactions  . Bactrim [Sulfamethoxazole W/Trimethoprim (Co-Trimoxazole)] Hives and Shortness Of Breath  . Cymbalta [Duloxetine Hcl] Diarrhea  . Other Hives and Swelling    [employer] toothpaste   . Zoloft [Sertraline Hcl] Diarrhea  . Neurontin [Gabapentin] Rash      Outpatient Medications Prior to Visit  Medication Sig  Dispense Refill  . acetaminophen (TYLENOL) 325 MG tablet Take 650 mg by mouth every 6 (six) hours as needed.    Marland Kitchen amLODipine-benazepril (LOTREL) 10-20 MG capsule Take 1 capsule by mouth daily. 30 capsule 1  . buPROPion (WELLBUTRIN SR) 150 MG 12 hr tablet Take 1 tablet by mouth daily for 3 days then increase to 1 tablet by mouth twice daily. Plan to quit smoking during second week and continue for 12 weeks beyond quit date. 60 tablet 2  . elvitegravir-cobicistat-emtricitabine-tenofovir (GENVOYA) 150-150-200-10 MG TABS tablet Take 1 tablet by mouth daily with breakfast. 30 tablet 6  . pantoprazole (PROTONIX) 20 MG tablet Take 1 tablet (20 mg total) by mouth daily. 30 tablet 0   No facility-administered medications prior to visit.     Past Medical History:  Diagnosis Date  . Anxiety   . Arthritis    "neck" (02/16/2015)  . 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 Chronic lower back pain   . Depression   . Fibromyalgia   . Genital warts   . HIV disease (HCC) 02/28/2015  . HIV infection (HCC)    followed by Dr. Luciana Axe- sees him every 4 months  . Hypertension   . IBS (irritable bowel syndrome)   . Migraine    "none in years" (02/16/2015  . Overweight 07/20/2015  . Renal insufficiency 12/29/2013     Past Surgical History:  Procedure Laterality Date  . CO2 LASER APPLICATION N/A 02/11/2013   Procedure: CO2 LASER APPLICATION;  Surgeon: Romie Levee, MD;  Location: [facility];  Service: General;  Laterality: N/A;  . HIGH RESOLUTION ANOSCOPY N/A 02/11/2013   Procedure: HIGH RESOLUTION ANOSCOPY WITH BIOPSY, LASER ABLATION;  Surgeon: Romie Levee, MD;  Location: [facility];  Service: General;  Laterality: N/A;  . WISDOM TOOTH EXTRACTION      Review of Systems  Constitutional: Negative for appetite change, chills, fatigue, fever and unexpected weight change.  Eyes: Negative for visual disturbance.  Respiratory: Negative for cough, chest tightness,  shortness of breath and wheezing.   Cardiovascular: Negative for chest pain and leg swelling.  Gastrointestinal: Negative for abdominal pain, constipation, diarrhea, nausea and vomiting.  Genitourinary: Negative for dysuria, flank pain, frequency, genital sores, hematuria and urgency.  Skin: Negative for rash.  Allergic/Immunologic: Negative for immunocompromised state.  Neurological: Negative for dizziness and headaches.      Objective:    BP 126/88   Pulse (!) 101   Temp 98.1 F (36.7 C) (Oral)   Wt 211 lb (95.7 kg)   BMI 28.62 kg/m  Nursing note and vital signs reviewed.  Physical Exam Constitutional:      General: He is not in acute distress.    Appearance: He is well-developed.  Eyes:     Conjunctiva/sclera: Conjunctivae normal.  Cardiovascular:     Rate and Rhythm: Normal rate and regular rhythm.     Heart sounds: Normal heart sounds. No murmur heard.  No friction rub. No gallop.   Pulmonary:     Effort: Pulmonary effort is normal. No respiratory distress.     Breath sounds: Normal breath sounds. No wheezing or rales.  Chest:     Chest wall: No tenderness.  Abdominal:     General: Bowel sounds are normal.     Palpations: Abdomen is soft.     Tenderness: There is no abdominal tenderness.  Musculoskeletal:     Cervical back: Neck supple.  Lymphadenopathy:     Cervical: No cervical adenopathy.  Skin:    General: Skin is warm and dry.     Findings: No rash.  Neurological:     Mental Status: He is alert and oriented to person, place, and time.  Psychiatric:        Behavior: Behavior normal.        Thought Content: Thought content normal.        Judgment: Judgment normal.      Depression screen [facility] 2/9 12/24/2019 01/29/2018 07/31/2017 04/29/2017 07/20/2015  Decreased Interest 0 0 1 1 (No Data)  Down, Depressed, Hopeless 0 0 1 1 -  PHQ - 2 Score 0 0 2 2 -  Altered sleeping - - 0 1 -  Tired, decreased energy - - 1 2 -  Change in appetite - - 0 1 -  Feeling bad  or failure about yourself  - - 1 3 -  Trouble concentrating - - 0 1 -  Moving slowly or fidgety/restless - - 0 0 -  Suicidal thoughts - - 0 0 -  PHQ-9 Score - - 4 10 -  Difficult doing work/chores - - Somewhat difficult Somewhat difficult -  Some recent data might be hidden       Assessment & Plan:    Patient Active Problem List   Diagnosis Date Noted  . Foot lesion 12/24/2019  . Human immunodeficiency virus (HIV) disease (HCC) 08/25/2019  . Gonorrhea 02/11/2019  . Overweight 07/20/2015  . Colitis 03/08/2015  . HIV (human immunodeficiency virus infection) (HCC)   .  Tobacco abuse   . LGV (lymphogranuloma venereum)   . Primary syphilis   . Chronic hepatitis B ([place]) 10/03/2014  . HTN (hypertension)   . Gynecomastia 04/30/2014  . 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 Chronic pain associated with significant psychosocial dysfunction 09/18/2012  . Fibromyalgia muscle pain 02/11/2012  . Depression 12/07/2011  . Anxiety 02/02/2011     Problem List Items Addressed This Visit      Digestive   Chronic hepatitis B ([place]) (Chronic)    Currently suppressed with Genvoya. Asymptomatic and no recent flares. Will recheck Hepatitis B lab work with next blood work.       Relevant Medications   elvitegravir-cobicistat-emtricitabine-tenofovir (GENVOYA) 150-150-200-10 MG TABS tablet   Other Relevant Orders   Hepatitis B surface antigen   Hepatitis B DNA, ultraquantitative, PCR   Hepatitis B e antigen   Hepatitis B e antibody   Liver Fibrosis, FibroTest-ActiTest   Hepatitis delta antibody     Other   Human immunodeficiency virus (HIV) disease ([place]) - Primary (Chronic)    Phillip Gill continues to have well controlled HIV disease with good adherence and tolerance to his ART regimen of Genvoya. No signs/symtpoms of opportunistic infection or progressive HIV disease. Reviewed lab work and discussed the plan of care. Continue current dose of Genvoya. Plan for follow up in 4 months or sooner if needed  with lab work 1-2 weeks prior to your appointment.       Relevant Medications   elvitegravir-cobicistat-emtricitabine-tenofovir (GENVOYA) 150-150-200-10 MG TABS tablet   Other Relevant Orders   HIV-1 RNA quant-no reflex-bld   T-helper cell (CD4)- ([facility] clinic only)   Comprehensive metabolic panel   Foot lesion    Phillip Gill has a dime sized lesion located on the medial plantar aspect of his right foot which appears like a ganglion type cyst. Recommend OTC donut padding as needed for pressure relief. Will refer to podiatry for further assessment and treatment.       Relevant Orders   Ambulatory referral to Podiatry    Other Visit Diagnoses    Screening for STDs (sexually transmitted diseases)       Relevant Orders   RPR       I am having Phillip Gill maintain his amLODipine-benazepril, acetaminophen, pantoprazole, buPROPion, and elvitegravir-cobicistat-emtricitabine-tenofovir.   Meds ordered this encounter  Medications  . elvitegravir-cobicistat-emtricitabine-tenofovir (GENVOYA) 150-150-200-10 MG TABS tablet    Sig: Take 1 tablet by mouth daily with breakfast.    Dispense:  30 tablet    Refill:  6    Order Specific Question:   Supervising Provider    Answer:   Carlyle Basques [[id number]]     Follow-up: Return in about 4 months (around 04/24/2020), or if symptoms worsen or fail to improve.   Terri Piedra, MSN, FNP-C Nurse Practitioner [facility] for Infectious Disease [place] number: [phone]

## 2019-12-29 ENCOUNTER — Emergency Department (HOSPITAL_BASED_OUTPATIENT_CLINIC_OR_DEPARTMENT_OTHER)
Admission: EM | Admit: 2019-12-29 | Discharge: 2019-12-30 | Disposition: A | Discharge status: Home / Self Care | Payer: BC Managed Care – PPO | Attending: Emergency Medicine | Admitting: Emergency Medicine

## 2019-12-29 ENCOUNTER — Encounter (HOSPITAL_BASED_OUTPATIENT_CLINIC_OR_DEPARTMENT_OTHER): Payer: Self-pay | Admitting: *Deleted

## 2019-12-29 ENCOUNTER — Other Ambulatory Visit: Payer: Self-pay

## 2019-12-29 DIAGNOSIS — R1032 Left lower quadrant pain: Secondary | ICD-10-CM | POA: Insufficient documentation

## 2019-12-29 DIAGNOSIS — I10 Essential (primary) hypertension: Secondary | ICD-10-CM | POA: Diagnosis not present

## 2019-12-29 DIAGNOSIS — Z79899 Other long term (current) drug therapy: Secondary | ICD-10-CM | POA: Diagnosis not present

## 2019-12-29 DIAGNOSIS — Z87891 Personal history of nicotine dependence: Secondary | ICD-10-CM | POA: Diagnosis not present

## 2019-12-29 DIAGNOSIS — K529 Noninfective gastroenteritis and colitis, unspecified: Secondary | ICD-10-CM

## 2019-12-29 LAB — URINALYSIS, ROUTINE W REFLEX MICROSCOPIC
Bilirubin Urine: NEGATIVE
Glucose, UA: NEGATIVE mg/dL
Ketones, ur: NEGATIVE mg/dL
Leukocytes,Ua: NEGATIVE
Nitrite: NEGATIVE
Protein, ur: NEGATIVE mg/dL
Specific Gravity, Urine: 1.015 (ref 1.005–1.030)
pH: 6 (ref 5.0–8.0)

## 2019-12-29 LAB — COMPREHENSIVE METABOLIC PANEL
ALT: 18 U/L (ref 0–44)
AST: 23 U/L (ref 15–41)
Albumin: 3.9 g/dL (ref 3.5–5.0)
Alkaline Phosphatase: 70 U/L (ref 38–126)
Anion gap: 10 (ref 5–15)
BUN: 14 mg/dL (ref 6–20)
CO2: 24 mmol/L (ref 22–32)
Calcium: 8.3 mg/dL — ABNORMAL LOW (ref 8.9–10.3)
Chloride: 103 mmol/L (ref 98–111)
Creatinine, Ser: 1.22 mg/dL (ref 0.61–1.24)
GFR calc Af Amer: >60 mL/min (ref >60)
GFR calc non Af Amer: >60 mL/min (ref >60)
Glucose, Bld: 127 mg/dL — ABNORMAL HIGH (ref 70–99)
Potassium: 3.4 mmol/L — ABNORMAL LOW (ref 3.5–5.1)
Sodium: 137 mmol/L (ref 135–145)
Total Bilirubin: 0.3 mg/dL (ref 0.3–1.2)
Total Protein: 7.8 g/dL (ref 6.5–8.1)

## 2019-12-29 LAB — CBC
HCT: 39.5 % (ref 39.0–52.0)
Hemoglobin: 13 g/dL (ref 13.0–17.0)
MCH: 31.1 pg (ref 26.0–34.0)
MCHC: 32.9 g/dL (ref 30.0–36.0)
MCV: 94.5 fL (ref 80.0–100.0)
Platelets: 227 10*3/uL (ref 150–400)
RBC: 4.18 MIL/uL — ABNORMAL LOW (ref 4.22–5.81)
RDW: 13.2 % (ref 11.5–15.5)
WBC: 18.1 10*3/uL — ABNORMAL HIGH (ref 4.0–10.5)
nRBC: 0 % (ref 0.0–0.2)

## 2019-12-29 LAB — URINALYSIS, MICROSCOPIC (REFLEX)

## 2019-12-29 LAB — LIPASE, BLOOD: Lipase: 38 U/L (ref 11–51)

## 2019-12-29 MED ORDER — SODIUM CHLORIDE 0.9% FLUSH
3.0000 mL | Freq: Once | INTRAVENOUS | Status: DC
  Filled 2019-12-29: qty 3

## 2019-12-29 NOTE — ED Triage Notes (Signed)
Crampy abdominal pain x 5 days. Stools have copious mucus.

## 2019-12-30 ENCOUNTER — Emergency Department (HOSPITAL_BASED_OUTPATIENT_CLINIC_OR_DEPARTMENT_OTHER): Payer: BC Managed Care – PPO

## 2019-12-30 ENCOUNTER — Encounter (HOSPITAL_BASED_OUTPATIENT_CLINIC_OR_DEPARTMENT_OTHER): Payer: Self-pay | Admitting: Emergency Medicine

## 2019-12-30 MED ORDER — AMOXICILLIN-POT CLAVULANATE 875-125 MG PO TABS
1.0000 | ORAL_TABLET | Freq: Once | ORAL | Status: AC
  Administered 2019-12-30: 1 via ORAL
  Filled 2019-12-30: qty 1

## 2019-12-30 MED ORDER — KETOROLAC TROMETHAMINE 30 MG/ML IJ SOLN
30.0000 mg | Freq: Once | INTRAMUSCULAR | Status: AC
  Administered 2019-12-30: 30 mg via INTRAVENOUS
  Filled 2019-12-30: qty 1

## 2019-12-30 MED ORDER — IOHEXOL 300 MG/ML  SOLN
100.0000 mL | Freq: Once | INTRAMUSCULAR | Status: AC | PRN
  Administered 2019-12-30: 100 mL via INTRAVENOUS

## 2019-12-30 MED ORDER — AMOXICILLIN-POT CLAVULANATE 875-125 MG PO TABS: 1.0000 | ORAL_TABLET | Freq: Two times a day (BID) | ORAL | 0 refills | Status: DC

## 2019-12-30 MED ORDER — MELOXICAM 7.5 MG PO TABS
7.5000 mg | ORAL_TABLET | Freq: Every day | ORAL | 0 refills | Status: DC
Start: 2019-12-30 — End: 2020-02-25

## 2019-12-30 NOTE — ED Provider Notes (Signed)
[facility] EMERGENCY DEPARTMENT Provider Note   CSN: [id number] Arrival date & time: 12/29/19  2251     History Chief Complaint  Patient presents with  . Abdominal Pain    Phillip Gill is a 44 y.o. male.   Abdominal Pain Pain location:  LLQ Pain quality: cramping   Pain radiates to:  Does not radiate Pain severity:  Severe Onset quality:  Gradual Duration:  1 week Timing:  Constant Progression:  Worsening Chronicity:  New Context: not eating   Context comment:  Was constipated and then drank a milk shake as is lactose intolerance and now has tenesmus.  is passing gas but not stool.   Relieved by:  Nothing Ineffective treatments:  None tried Associated symptoms: no anorexia, no belching, no chest pain, no chills, no constipation, no cough, no diarrhea, no dysuria, no fatigue, no fever, no flatus, no hematemesis, no hematochezia, no hematuria, no melena, no nausea, no shortness of breath, no sore throat, no vaginal bleeding, no vaginal discharge and no vomiting   Risk factors: no alcohol abuse   Patient with HIV with one week of tenesmus and passing gas but not much stool.  No f/c/r.  No n/v/d.       Past Medical History:  Diagnosis Date  . Anxiety   . Arthritis    "neck" (02/16/2015)  . 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 Chronic lower back pain   . Depression   . Fibromyalgia   . Genital warts   . HIV disease (HCC) 02/28/2015  . HIV infection (HCC)    followed by Dr. Luciana Axeomer- sees him every 4 months  . Hypertension   . IBS (irritable bowel syndrome)   . Migraine    "none in years" (02/16/2015  . Overweight 07/20/2015  . Renal insufficiency 12/29/2013    Patient Active Problem List   Diagnosis Date Noted  . Foot lesion 12/24/2019  . Human immunodeficiency virus (HIV) disease (HCC) 08/25/2019  . Gonorrhea 02/11/2019  . Overweight 07/20/2015  . Colitis 03/08/2015  . HIV (human immunodeficiency virus infection) (HCC)   . Tobacco  abuse   . LGV (lymphogranuloma venereum)   . Primary syphilis   . Chronic hepatitis B (HCC) 10/03/2014  . HTN (hypertension)   . Gynecomastia 04/30/2014  . 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 Chronic pain associated with significant psychosocial dysfunction 09/18/2012  . Fibromyalgia muscle pain 02/11/2012  . Depression 12/07/2011  . Anxiety 02/02/2011    Past Surgical History:  Procedure Laterality Date  . CO2 LASER APPLICATION N/A 02/11/2013   Procedure: CO2 LASER APPLICATION;  Surgeon: Romie LeveeAlicia Thomas, MD;  [facility] [place];  Service: General;  Laterality: N/A;  . HIGH RESOLUTION ANOSCOPY N/A 02/11/2013   Procedure: HIGH RESOLUTION ANOSCOPY WITH BIOPSY, LASER ABLATION;  Surgeon: Romie LeveeAlicia Thomas, MD;  [facility] [place];  Service: General;  Laterality: N/A;  . WISDOM TOOTH EXTRACTION         Family History  Problem Relation Age of Onset  . Hypertension Mother   . Diabetes Mother   . Stroke Mother        cerbral aneurysm  . Hypertension Brother   . Mental illness Neg Hx     Social History   Tobacco Use  . Smoking status: Current Every Day Smoker    Packs/day: 0.25    Years: 18.00    Pack years: 4.50    Types: Cigarettes  . Smokeless tobacco: Never Used  Vaping Use  . Vaping Use: Never used  Substance Use Topics  . Alcohol use: Yes    Comment: "Once in a blue moon"  . Drug use: No    Home Medications Prior to Admission medications   Medication Sig Start Date End Date Taking? Authorizing Provider  acetaminophen (TYLENOL) 325 MG tablet Take 650 mg by mouth every 6 (six) hours as needed.   Yes [provider]  amLODipine-benazepril (LOTREL) 10-20 MG capsule Take 1 capsule by mouth daily. 09/29/17  Yes Mancel Bale, MD  elvitegravir-cobicistat-emtricitabine-tenofovir (GENVOYA) 150-150-200-10 MG TABS tablet Take 1 tablet by mouth daily with breakfast. 12/24/19  Yes Veryl Speak, FNP  buPROPion [facility] SR) 150 MG 12 hr  tablet Take 1 tablet by mouth daily for 3 days then increase to 1 tablet by mouth twice daily. Plan to quit smoking during second week and continue for 12 weeks beyond quit date. 08/25/19   Veryl Speak, FNP  pantoprazole (PROTONIX) 20 MG tablet Take 1 tablet (20 mg total) by mouth daily. 05/04/19 06/03/19  Long, Arlyss Repress, MD    Allergies    Bactrim [sulfamethoxazole w/trimethoprim (co-trimoxazole)], Cymbalta [duloxetine hcl], Other, Zoloft [sertraline hcl], and Neurontin [gabapentin]  Review of Systems   Review of Systems  Constitutional: Negative for chills, fatigue and fever.  HENT: Negative for sore throat.   Eyes: Negative for visual disturbance.  Respiratory: Negative for cough and shortness of breath.   Cardiovascular: Negative for chest pain.  Gastrointestinal: Positive for abdominal pain. Negative for anorexia, constipation, diarrhea, flatus, hematemesis, hematochezia, melena, nausea and vomiting.  Genitourinary: Negative for dysuria, hematuria, vaginal bleeding and vaginal discharge.  Neurological: Negative for dizziness.  Psychiatric/Behavioral: Negative for agitation.  All other systems reviewed and are negative.   Physical Exam Updated Vital Signs BP 126/85 (BP Location: Right Arm)   Pulse 83   Temp 98.8 F (37.1 C) (Oral)   Resp 16   Ht 6' (1.829 m)   Wt 95.3 kg   SpO2 99%   BMI 28.48 kg/m   Physical Exam Vitals and nursing note reviewed.  Constitutional:      General: He is not in acute distress.    Appearance: Normal appearance.  HENT:     Head: Normocephalic and atraumatic.     Nose: Nose normal.  Eyes:     Conjunctiva/sclera: Conjunctivae normal.     Pupils: Pupils are equal, round, and reactive to light.  Cardiovascular:     Rate and Rhythm: Normal rate and regular rhythm.     Pulses: Normal pulses.     Heart sounds: Normal heart sounds.  Pulmonary:     Effort: Pulmonary effort is normal.     Breath sounds: Normal breath sounds.  Abdominal:      General: Abdomen is flat. Bowel sounds are normal.     Palpations: Abdomen is soft.     Tenderness: There is no abdominal tenderness. There is no guarding or rebound.  Musculoskeletal:        General: Normal range of motion.     Cervical back: Normal range of motion and neck supple.  Skin:    General: Skin is warm and dry.     Capillary Refill: Capillary refill takes less than 2 seconds.  Neurological:     General: No focal deficit present.     Mental Status: He is alert and oriented to person, place, and time.  Psychiatric:        Mood and Affect: Mood normal.  Behavior: Behavior normal.     ED Results / Procedures / Treatments   Labs (all labs ordered are listed, but only abnormal results are displayed) Results for orders placed or performed during the hospital encounter of 12/29/19  Lipase, blood  Result Value Ref Range   Lipase 38 11 - 51 U/L  Comprehensive metabolic panel  Result Value Ref Range   Sodium 137 135 - 145 mmol/L   Potassium 3.4 (L) 3.5 - 5.1 mmol/L   Chloride 103 98 - 111 mmol/L   CO2 24 22 - 32 mmol/L   Glucose, Bld 127 (H) 70 - 99 mg/dL   BUN 14 6 - 20 mg/dL   Creatinine, Ser 4.09 0.61 - 1.24 mg/dL   Calcium 8.3 (L) 8.9 - 10.3 mg/dL   Total Protein 7.8 6.5 - 8.1 g/dL   Albumin 3.9 3.5 - 5.0 g/dL   AST 23 15 - 41 U/L   ALT 18 0 - 44 U/L   Alkaline Phosphatase 70 38 - 126 U/L   Total Bilirubin 0.3 0.3 - 1.2 mg/dL   GFR calc non Af Amer >60 >60 mL/min   GFR calc Af Amer >60 >60 mL/min   Anion gap 10 5 - 15  CBC  Result Value Ref Range   WBC 18.1 (H) 4.0 - 10.5 K/uL   RBC 4.18 (L) 4.22 - 5.81 MIL/uL   Hemoglobin 13.0 13.0 - 17.0 g/dL   HCT 81.1 39 - 52 %   MCV 94.5 80.0 - 100.0 fL   MCH 31.1 26.0 - 34.0 pg   MCHC 32.9 30.0 - 36.0 g/dL   RDW 91.4 78.2 - 95.6 %   Platelets 227 150 - 400 K/uL   nRBC 0.0 0.0 - 0.2 %  Urinalysis, Routine w reflex microscopic  Result Value Ref Range   Color, Urine YELLOW YELLOW   APPearance CLEAR CLEAR    Specific Gravity, Urine 1.015 1.005 - 1.030   pH 6.0 5.0 - 8.0   Glucose, UA NEGATIVE NEGATIVE mg/dL   Hgb urine dipstick TRACE (A) NEGATIVE   Bilirubin Urine NEGATIVE NEGATIVE   Ketones, ur NEGATIVE NEGATIVE mg/dL   Protein, ur NEGATIVE NEGATIVE mg/dL   Nitrite NEGATIVE NEGATIVE   Leukocytes,Ua NEGATIVE NEGATIVE  Urinalysis, Microscopic (reflex)  Result Value Ref Range   RBC / HPF 0-5 0 - 5 RBC/hpf   WBC, UA 0-5 0 - 5 WBC/hpf   Bacteria, UA RARE (A) NONE SEEN   Squamous Epithelial / LPF 0-5 0 - 5   CT ABDOMEN PELVIS W CONTRAST  Result Date: 12/30/2019 CLINICAL DATA:  Pain.  Left lower quadrant abdominal pain. EXAM: CT ABDOMEN AND PELVIS WITH CONTRAST TECHNIQUE: Multidetector CT imaging of the abdomen and pelvis was performed using the standard protocol following bolus administration of intravenous contrast. CONTRAST:  OMNIPAQUE IOHEXOL 300 MG/ML  SOLN COMPARISON:  CT 03/08/2015. FINDINGS: Lower chest: Lung bases are clear.  Pleural fluid. Hepatobiliary: No focal liver abnormality is seen. No gallstones, gallbladder wall thickening, or biliary dilatation. Pancreas: No ductal dilatation or inflammation. Spleen: Normal in size without focal abnormality. Adrenals/Urinary Tract: Normal adrenal glands. Tiny subcentimeter hypodense renal lesions are too small to accurately characterize, but likely small cysts. No perinephric edema. Right kidney is unremarkable. No hydronephrosis. No urolithiasis. Urinary bladder is partially distended. No bladder wall thickening. Stomach/Bowel: Moderate length wall thickening and adjacent edema involving the distal sigmoid colon to rectum. No significant diverticular disease. Moderate volume of stool in the more proximal colon. Stomach and  small bowel are unremarkable. Normal appendix. Vascular/Lymphatic: Aorto bi-iliac atherosclerosis. No aortic aneurysm. Scattered small retroperitoneal and central mesenteric nodes are not enlarged by size criteria. Portal vein  and mesenteric vessels are patent. Reproductive: Prostate is unremarkable. Other: Pericolonic edema trace fluid in the pelvis. No perforation or abscess. No free air. Musculoskeletal: There are no acute or suspicious osseous abnormalities. Stable tiny sclerotic density in the right iliac bone. IMPRESSION: Moderate length wall thickening and adjacent pericolonic edema involving the distal sigmoid colon to rectum consistent with colitis, likely infectious or inflammatory. Aortic Atherosclerosis (ICD10-I70.0). Electronically Signed   By: Narda Rutherford M.D.   On: 12/30/2019 01:36    EKG None  Radiology CT ABDOMEN PELVIS W CONTRAST  Result Date: 12/30/2019 CLINICAL DATA:  Pain.  Left lower quadrant abdominal pain. EXAM: CT ABDOMEN AND PELVIS WITH CONTRAST TECHNIQUE: Multidetector CT imaging of the abdomen and pelvis was performed using the standard protocol following bolus administration of intravenous contrast. CONTRAST:  OMNIPAQUE IOHEXOL 300 MG/ML  SOLN COMPARISON:  CT 03/08/2015. FINDINGS: Lower chest: Lung bases are clear.  Pleural fluid. Hepatobiliary: No focal liver abnormality is seen. No gallstones, gallbladder wall thickening, or biliary dilatation. Pancreas: No ductal dilatation or inflammation. Spleen: Normal in size without focal abnormality. Adrenals/Urinary Tract: Normal adrenal glands. Tiny subcentimeter hypodense renal lesions are too small to accurately characterize, but likely small cysts. No perinephric edema. Right kidney is unremarkable. No hydronephrosis. No urolithiasis. Urinary bladder is partially distended. No bladder wall thickening. Stomach/Bowel: Moderate length wall thickening and adjacent edema involving the distal sigmoid colon to rectum. No significant diverticular disease. Moderate volume of stool in the more proximal colon. Stomach and small bowel are unremarkable. Normal appendix. Vascular/Lymphatic: Aorto bi-iliac atherosclerosis. No aortic aneurysm. Scattered small  retroperitoneal and central mesenteric nodes are not enlarged by size criteria. Portal vein and mesenteric vessels are patent. Reproductive: Prostate is unremarkable. Other: Pericolonic edema trace fluid in the pelvis. No perforation or abscess. No free air. Musculoskeletal: There are no acute or suspicious osseous abnormalities. Stable tiny sclerotic density in the right iliac bone. IMPRESSION: Moderate length wall thickening and adjacent pericolonic edema involving the distal sigmoid colon to rectum consistent with colitis, likely infectious or inflammatory. Aortic Atherosclerosis (ICD10-I70.0). Electronically Signed   By: Narda Rutherford M.D.   On: 12/30/2019 01:36    Procedures Procedures (including critical care time)  Medications Ordered in ED Medications  iohexol (OMNIPAQUE) 300 MG/ML solution 100 mL (100 mLs Intravenous Contrast Given 12/30/19 0049)  ketorolac (TORADOL) 30 MG/ML injection 30 mg (30 mg Intravenous Given 12/30/19 0155)  amoxicillin-clavulanate (AUGMENTIN) 875-125 MG per tablet 1 tablet (1 tablet Oral Given 12/30/19 0155)    ED Course  I have reviewed the triage vital signs and the nursing notes.  Pertinent labs & imaging results that were available during my care of the patient were reviewed by me and considered in my medical decision making (see chart for details).    Patient with colitis on CT scan. No abscess.  No free air.  Tolerating POs will treat with augmentin x 10 days and NSAIDS and have patient follow up with ID and GI.  Patient verbalizes understanding and agrees to follow up.  Strict return precautions given to the patient for abdominal pain return.    Phillip Gill was evaluated in Emergency Department on 12/30/2019 for the symptoms described in the history of present illness. He was evaluated in the context of the global COVID-19 pandemic, which necessitated consideration that the patient  might be at risk for infection with the SARS-CoV-2 virus that causes  COVID-19. Institutional protocols and algorithms that pertain to the evaluation of patients at risk for COVID-19 are in a state of rapid change based on information released by regulatory bodies including the CDC and federal and state organizations. These policies and algorithms were followed during the patient's care in the ED.  Final Clinical Impression(s) / ED Diagnoses Return for intractable cough, coughing up blood,fevers >100.4 unrelieved by medication, shortness of breath, intractable vomiting, chest pain, shortness of breath, weakness,numbness, changes in speech, facial asymmetry,abdominal pain, passing out,Inability to tolerate liquids or food, cough, altered mental status or any concerns. No signs of systemic illness or infection. The patient is nontoxic-appearing on exam and vital signs are within normal limits.   I have reviewed the triage vital signs and the nursing notes. Pertinent labs &imaging results that were available during my care of the patient were reviewed by me and considered in my medical decision making (see chart for details).After history, exam, and medical workup I feel the patient has beenappropriately medically screened and is safe for discharge home. Pertinent diagnoses were discussed with the patient. Patient was given return precautions.      Phillip Inabinet, MD 12/30/19 [phone]

## 2020-01-08 ENCOUNTER — Ambulatory Visit (INDEPENDENT_AMBULATORY_CARE_PROVIDER_SITE_OTHER): Payer: BC Managed Care – PPO

## 2020-01-08 ENCOUNTER — Ambulatory Visit: Payer: BC Managed Care – PPO | Admitting: Podiatry

## 2020-01-08 ENCOUNTER — Other Ambulatory Visit: Payer: Self-pay

## 2020-01-08 DIAGNOSIS — L723 Sebaceous cyst: Secondary | ICD-10-CM | POA: Diagnosis not present

## 2020-01-08 DIAGNOSIS — M722 Plantar fascial fibromatosis: Secondary | ICD-10-CM | POA: Diagnosis not present

## 2020-01-08 NOTE — Patient Instructions (Signed)
Pre-Operative Instructions  Congratulations, you have decided to take an important step towards improving your quality of life.  You can be assured that the doctors and staff at [facility] will be with you every step of the way.  Here are some important things you should know:  1. Plan to be at the surgery center/hospital at least 1 (one) hour prior to your scheduled time, unless otherwise directed by the surgical center/hospital staff.  You must have a responsible adult accompany you, remain during the surgery and drive you home.  Make sure you have directions to the surgical center/hospital to ensure you arrive on time. 2. If you are having surgery at [facility] or [facility], you will need a copy of your medical history and physical form from your family physician within one month prior to the date of surgery. We will give you a form for your primary physician to complete.  3. We make every effort to accommodate the date you request for surgery.  However, there are times where surgery dates or times have to be moved.  We will contact you as soon as possible if a change in schedule is required.   4. No aspirin/ibuprofen for one week before surgery.  If you are on aspirin, any non-steroidal anti-inflammatory medications (Mobic, Aleve, Ibuprofen) should not be taken seven (7) days prior to your surgery.  You make take Tylenol for pain prior to surgery.  5. Medications - If you are taking daily heart and blood pressure medications, seizure, reflux, allergy, asthma, anxiety, pain or diabetes medications, make sure you notify the surgery center/hospital before the day of surgery so they can tell you which medications you should take or avoid the day of surgery. 6. No food or drink after midnight the night before surgery unless directed otherwise by surgical center/hospital staff. 7. No alcoholic beverages 24-hours prior to surgery.  No smoking 24-hours prior or 24-hours after  surgery. 8. Wear loose pants or shorts. They should be loose enough to fit over bandages, boots, and casts. 9. Don't wear slip-on shoes. Sneakers are preferred. 10. Bring your boot with you to the surgery center/hospital.  Also bring crutches or a walker if your physician has prescribed it for you.  If you do not have this equipment, it will be provided for you after surgery. 11. If you have not been contacted by the surgery center/hospital by the day before your surgery, call to confirm the date and time of your surgery. 12. Leave-time from work may vary depending on the type of surgery you have.  Appropriate arrangements should be made prior to surgery with your employer. 13. Prescriptions will be provided immediately following surgery by your doctor.  Fill these as soon as possible after surgery and take the medication as directed. Pain medications will not be refilled on weekends and must be approved by the doctor. 14. Remove nail polish on the operative foot and avoid getting pedicures prior to surgery. 15. Wash the night before surgery.  The night before surgery wash the foot and leg well with water and the antibacterial soap provided. Be sure to pay special attention to beneath the toenails and in between the toes.  Wash for at least three (3) minutes. Rinse thoroughly with water and dry well with a towel.  Perform this wash unless told not to do so by your physician.  Enclosed: 1 Ice pack (please put in freezer the night before surgery)   1 Hibiclens skin cleaner     Pre-op instructions  If you have any questions regarding the instructions, please do not hesitate to call our office.  [place]: [address], [place], [place] [address] -- [phone]  [place]: [address] [place], [place] [address] -- [phone]  [place]: [address], [place], [place] [address] -- [phone]   Website: [email or web address] 

## 2020-01-08 NOTE — Progress Notes (Signed)
   HPI: 44 y.o. male presenting today as a new patient for evaluation of a lump to the plantar aspect of the left foot medial longitudinal arch.  Patient states he has noticed the lump for several years now however his slowly increased in size over the past 6 months.  It is now becoming tender with shoe gear.  Patient works at [employer] as a Lawyer.  He is on his feet during long periods of time and it is aggravated with standing and walking.  He works 12-hour shifts and is very symptomatic by the end of his shift.  He presents for further treatment and evaluation  Past Medical History:  Diagnosis Date  . Anxiety   . Arthritis    "neck" (02/16/2015)  . 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 Chronic lower back pain   . Depression   . Fibromyalgia   . Genital warts   . HIV disease (HCC) 02/28/2015  . HIV infection (HCC)    followed by Dr. Luciana Axe- sees him every 4 months  . Hypertension   . IBS (irritable bowel syndrome)   . Migraine    "none in years" (02/16/2015  . Overweight 07/20/2015  . Renal insufficiency 12/29/2013     Physical Exam: General: The patient is alert and oriented x3 in no acute distress.  Dermatology: Skin is warm, dry and supple bilateral lower extremities. Negative for open lesions or macerations.  Vascular: Palpable pedal pulses bilaterally. No edema or erythema noted. Capillary refill within normal limits.  Neurological: Epicritic and protective threshold grossly intact bilaterally.   Musculoskeletal Exam: Range of motion within normal limits to all pedal and ankle joints bilateral. Muscle strength 5/5 in all groups bilateral.  Hard palpable nodule noted to the medial longitudinal arch of the left foot which is nonadhered.  Findings consistent with a plantar fibroma.  Fibroma measures approximately 3 cm in length by 1 cm in width.  Fibroma appears to be adhered to the plantar fascia of the foot.  Radiographic Exam:  Normal osseous mineralization. Joint  spaces preserved. No fracture/dislocation/boney destruction.    Assessment: 1.  Symptomatic plantar fibroma left foot   Plan of Care:  1. Patient evaluated. X-Rays reviewed.  2.  Today we discussed both conservative and surgical modalities to alleviate the patient's symptoms.  The patient would like to have the fibroma removed.  He is unable to perform his job function without pain.  All possible complications and details the procedure were explained.  No guarantees were expressed or implied.  All patient questions were answered. 3.  Authorization for surgery was initiated today.  Surgery will consist of excision of plantar fibroma left foot 4.  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 Felecia Shelling, DPM    [address] [place], [place] [address]                Office [phone]  Fax [phone]

## 2020-02-14 ENCOUNTER — Telehealth: Payer: Self-pay | Admitting: *Deleted

## 2020-02-14 NOTE — Telephone Encounter (Signed)
DOS 02/25/2020 PLANTAR FIBROMA - [id number]  BCBS: Policy Effective 07/05/457 - 07/01/9998  Deductible ($1,500.00) / Deductible has been met Out-of-Pocket Limit ($5,900.00) / $1,368.59 Paid / $1,005.04 to go Co-insurance - 30%  Authorization is NOT REQUIRED.

## 2020-02-25 ENCOUNTER — Other Ambulatory Visit: Payer: Self-pay | Admitting: Podiatry

## 2020-02-25 DIAGNOSIS — M722 Plantar fascial fibromatosis: Secondary | ICD-10-CM

## 2020-02-25 MED ORDER — OXYCODONE-ACETAMINOPHEN 5-325 MG PO TABS: 1.0000 | ORAL_TABLET | ORAL | 0 refills | Status: DC | PRN

## 2020-02-25 MED ORDER — MELOXICAM 15 MG PO TABS: 15.0000 mg | ORAL_TABLET | Freq: Every day | ORAL | 1 refills | Status: DC

## 2020-02-25 NOTE — Progress Notes (Signed)
PRN postop 

## 2020-03-03 ENCOUNTER — Ambulatory Visit (INDEPENDENT_AMBULATORY_CARE_PROVIDER_SITE_OTHER): Payer: BC Managed Care – PPO | Admitting: Podiatry

## 2020-03-03 ENCOUNTER — Other Ambulatory Visit: Payer: Self-pay

## 2020-03-03 ENCOUNTER — Encounter: Payer: Self-pay | Admitting: Podiatry

## 2020-03-03 VITALS — BP 122/79 | HR 91 | Temp 99.1°F

## 2020-03-03 DIAGNOSIS — Z9889 Other specified postprocedural states: Secondary | ICD-10-CM

## 2020-03-03 DIAGNOSIS — M722 Plantar fascial fibromatosis: Secondary | ICD-10-CM

## 2020-03-03 DIAGNOSIS — M79676 Pain in unspecified toe(s): Secondary | ICD-10-CM

## 2020-03-04 ENCOUNTER — Encounter: Payer: BC Managed Care – PPO | Admitting: Podiatry

## 2020-03-08 ENCOUNTER — Encounter: Payer: Self-pay | Admitting: Podiatry

## 2020-03-08 NOTE — Progress Notes (Signed)
Subjective:  Patient ID: Phillip Gill, male    DOB: Aug 12, 1975,  MRN: [id number]  Chief Complaint  Patient presents with  . Routine Post Op    POV #1 DOS 02/25/20 PLANTAR FIBROMA LT      44 y.o. male returns for post-op check.  Patient states he is doing a lot better.  He does not have any pain.  He denies any nausea fever chills vomiting.  He has been ambulating nonweightbearing with a cam boot.  He denies getting the foot wet.  Review of Systems: Negative except as noted in the HPI. Denies N/V/F/Ch.  Past Medical History:  Diagnosis Date  . Anxiety   . Arthritis    "neck" (02/16/2015)  . 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 Chronic lower back pain   . Depression   . Fibromyalgia   . Genital warts   . HIV disease (HCC) 02/28/2015  . HIV infection (HCC)    followed by Dr. Luciana Axe- sees him every 4 months  . Hypertension   . IBS (irritable bowel syndrome)   . Migraine    "none in years" (02/16/2015  . Overweight 07/20/2015  . Renal insufficiency 12/29/2013    Current Outpatient Medications:  .  acetaminophen (TYLENOL) 325 MG tablet, Take 650 mg by mouth every 6 (six) hours as needed., Disp: , Rfl:  .  ALPRAZolam (XANAX) 0.25 MG tablet, Take by mouth., Disp: , Rfl:  .  amLODipine-benazepril (LOTREL) 10-20 MG capsule, Take 1 capsule by mouth daily., Disp: 30 capsule, Rfl: 1 .  amoxicillin-clavulanate (AUGMENTIN) 875-125 MG tablet, Take 1 tablet by mouth 2 (two) times daily., Disp: 20 tablet, Rfl: 0 .  buPROPion (WELLBUTRIN SR) 150 MG 12 hr tablet, Take 1 tablet by mouth daily for 3 days then increase to 1 tablet by mouth twice daily. Plan to quit smoking during second week and continue for 12 weeks beyond quit date., Disp: 60 tablet, Rfl: 2 .  darunavir-cobicistat (PREZCOBIX) 800-150 MG tablet, Take by mouth., Disp: , Rfl:  .  elvitegravir-cobicistat-emtricitabine-tenofovir (GENVOYA) 150-150-200-10 MG TABS tablet, Take 1 tablet by mouth daily with breakfast., Disp:  30 tablet, Rfl: 6 .  emtricitabine-tenofovir (TRUVADA) 200-300 MG tablet, Take by mouth., Disp: , Rfl:  .  meloxicam (MOBIC) 15 MG tablet, Take 1 tablet (15 mg total) by mouth daily., Disp: 30 tablet, Rfl: 1 .  nicotine polacrilex (NICORETTE) 4 MG gum, Take by mouth., Disp: , Rfl:  .  oxyCODONE-acetaminophen (PERCOCET) 5-325 MG tablet, Take 1 tablet by mouth every 4 (four) hours as needed for severe pain., Disp: 30 tablet, Rfl: 0 .  pantoprazole (PROTONIX) 20 MG tablet, Take 1 tablet (20 mg total) by mouth daily., Disp: 30 tablet, Rfl: 0  Social History   Tobacco Use  Smoking Status Current Every Day Smoker  . Packs/day: 0.25  . Years: 18.00  . Pack years: 4.50  . Types: Cigarettes  Smokeless Tobacco Never Used    Allergies  Allergen Reactions  . Bactrim [Sulfamethoxazole W/Trimethoprim (Co-Trimoxazole)] Hives and Shortness Of Breath  . Sulfa Antibiotics Hives and Shortness Of Breath  . Cymbalta [Duloxetine Hcl] Diarrhea  . Duloxetine Diarrhea  . Other Hives and Swelling    Colgate toothpaste   . Zoloft [Sertraline Hcl] Diarrhea  . Neurontin [Gabapentin] Rash   Objective:   Vitals:   03/03/20 1520  BP: 122/79  Pulse: 91  Temp: 99.1 F (37.3 C)   There is no height or weight on file to  calculate BMI. Constitutional Well developed. Well nourished.  Vascular Foot warm and well perfused. Capillary refill normal to all digits.   Neurologic Normal speech. Oriented to person, place, and time. Epicritic sensation to light touch grossly present bilaterally.  Dermatologic Skin healing well without signs of infection. Skin edges well coapted without signs of infection.  Orthopedic: Tenderness to palpation noted about the surgical site.   Radiographs: None Assessment:   1. Plantar fascial fibromatosis of left foot   2. Status post foot surgery    Plan:  Patient was evaluated and treated and all questions answered.  S/p foot surgery left -Progressing as expected  post-operatively. -XR: None -WB Status: Nonweightbearing in left lower extremity in a cam boot -Sutures: Intact.  No signs of dehiscence noted.  No clinical signs of infection noted. -Medications: None -Foot redressed.  No follow-ups on file.

## 2020-03-11 ENCOUNTER — Encounter: Payer: BC Managed Care – PPO | Admitting: Podiatry

## 2020-03-15 ENCOUNTER — Other Ambulatory Visit: Payer: Self-pay

## 2020-03-15 ENCOUNTER — Ambulatory Visit (INDEPENDENT_AMBULATORY_CARE_PROVIDER_SITE_OTHER): Payer: BC Managed Care – PPO | Admitting: Podiatry

## 2020-03-15 DIAGNOSIS — M722 Plantar fascial fibromatosis: Secondary | ICD-10-CM

## 2020-03-15 DIAGNOSIS — Z9889 Other specified postprocedural states: Secondary | ICD-10-CM

## 2020-03-15 NOTE — Progress Notes (Signed)
   Subjective:  Patient presents today status post excision of plantar fibroma left. DOS: 02/25/2020.  Patient states he is doing very well.  He has been weightbearing in the cam boot as instructed.  He currently is not taking any pain medication. no new complaints at this time.  Past Medical History:  Diagnosis Date  . Anxiety   . Arthritis    "neck" (02/16/2015)  . 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 Chronic lower back pain   . Depression   . Fibromyalgia   . Genital warts   . HIV disease (HCC) 02/28/2015  . HIV infection (HCC)    followed by Dr. Luciana Axe- sees him every 4 months  . Hypertension   . IBS (irritable bowel syndrome)   . Migraine    "none in years" (02/16/2015  . Overweight 07/20/2015  . Renal insufficiency 12/29/2013      Objective/Physical Exam Neurovascular status intact.  Skin incisions appear to be well coapted with sutures intact. No sign of infectious process noted. No dehiscence. No active bleeding noted. Moderate edema noted to the surgical extremity.  Assessment: 1. s/p excision plantar fibroma left. DOS: 02/25/2020   Plan of Care:  1. Patient was evaluated.  2.  Sutures removed today.  Light dressing applied. 3.  Patient may discontinue cam boot.  Recommend good supportive sneakers 4.  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 Felecia Shelling, DPM    [address]                                        [place], [place] [address]                Office [phone]  Fax [phone]

## 2020-03-21 ENCOUNTER — Other Ambulatory Visit: Payer: BC Managed Care – PPO

## 2020-03-22 ENCOUNTER — Other Ambulatory Visit: Payer: Self-pay

## 2020-03-22 ENCOUNTER — Other Ambulatory Visit: Payer: BC Managed Care – PPO

## 2020-03-22 DIAGNOSIS — Z113 Encounter for screening for infections with a predominantly sexual mode of transmission: Secondary | ICD-10-CM

## 2020-03-22 DIAGNOSIS — B181 Chronic viral hepatitis B without delta-agent: Secondary | ICD-10-CM

## 2020-03-22 DIAGNOSIS — B20 Human immunodeficiency virus [HIV] disease: Secondary | ICD-10-CM

## 2020-03-23 LAB — T-HELPER CELL (CD4) - (RCID CLINIC ONLY)
CD4 % Helper T Cell: 41 % (ref 33–65)
CD4 T Cell Abs: 1350 /uL (ref 400–1790)

## 2020-03-25 ENCOUNTER — Ambulatory Visit (INDEPENDENT_AMBULATORY_CARE_PROVIDER_SITE_OTHER): Payer: BC Managed Care – PPO | Admitting: Podiatry

## 2020-03-25 ENCOUNTER — Other Ambulatory Visit: Payer: Self-pay

## 2020-03-25 ENCOUNTER — Encounter: Payer: Self-pay | Admitting: Podiatry

## 2020-03-25 DIAGNOSIS — M722 Plantar fascial fibromatosis: Secondary | ICD-10-CM

## 2020-03-25 DIAGNOSIS — Z9889 Other specified postprocedural states: Secondary | ICD-10-CM

## 2020-03-25 NOTE — Progress Notes (Signed)
   Subjective:  Patient presents today status post excision of plantar fibroma left. DOS: 02/25/2020.  Patient states that he is doing very well.  He has been wearing regular good supportive sneakers.  No new complaints at this time  Past Medical History:  Diagnosis Date  . Anxiety   . Arthritis    "neck" (02/16/2015)  . 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 Chronic lower back pain   . Depression   . Fibromyalgia   . Genital warts   . HIV disease (HCC) 02/28/2015  . HIV infection (HCC)    followed by Dr. Luciana Axe- sees him every 4 months  . Hypertension   . IBS (irritable bowel syndrome)   . Migraine    "none in years" (02/16/2015  . Overweight 07/20/2015  . Renal insufficiency 12/29/2013      Objective/Physical Exam Neurovascular status intact.  Skin incisions appear to be well coapted with sutures intact. No sign of infectious process noted. No dehiscence. No active bleeding noted.  Negative for any significant edema noted to the surgical extremity.  Minimal pain or tenderness to palpation  Assessment: 1. s/p excision plantar fibroma left. DOS: 02/25/2020   Plan of Care:  1. Patient was evaluated.  2.  Patient may now resume full activity no restrictions 3.  Recommend good supportive shoes  4.  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 Felecia Shelling, DPM    [address]                                        [place], [place] [address]                Office [phone]  Fax [phone]

## 2020-04-01 LAB — LIVER FIBROSIS, FIBROTEST-ACTITEST
ALT: 30 U/L (ref 9–46)
Alpha-2-Macroglobulin: 167 mg/dL (ref 106–279)
Apolipoprotein A1: 129 mg/dL (ref 94–176)
Bilirubin: 0.3 mg/dL (ref 0.2–1.2)
Fibrosis Score: 0.12
GGT: 51 U/L (ref 3–95)
Haptoglobin: 200 mg/dL (ref 43–212)
Necroinflammat ACT Score: 0.12
Reference ID: 3566078

## 2020-04-01 LAB — RPR: RPR Ser Ql: NONREACTIVE

## 2020-04-01 LAB — COMPREHENSIVE METABOLIC PANEL
AG Ratio: 1.3 (calc) (ref 1.0–2.5)
ALT: 31 U/L (ref 9–46)
AST: 24 U/L (ref 10–40)
Albumin: 4.5 g/dL (ref 3.6–5.1)
Alkaline phosphatase (APISO): 71 U/L (ref 36–130)
BUN: 12 mg/dL (ref 7–25)
CO2: 26 mmol/L (ref 20–32)
Calcium: 9.6 mg/dL (ref 8.6–10.3)
Chloride: 105 mmol/L (ref 98–110)
Creat: 1.18 mg/dL (ref 0.60–1.35)
Globulin: 3.4 g/dL (calc) (ref 1.9–3.7)
Glucose, Bld: 81 mg/dL (ref 65–99)
Potassium: 4 mmol/L (ref 3.5–5.3)
Sodium: 139 mmol/L (ref 135–146)
Total Bilirubin: 0.3 mg/dL (ref 0.2–1.2)
Total Protein: 7.9 g/dL (ref 6.1–8.1)

## 2020-04-01 LAB — HEPATITIS DELTA ANTIBODY: Hepatitis D Ab, Total: NEGATIVE

## 2020-04-01 LAB — HEPATITIS B E ANTIBODY: Hep B E Ab: NONREACTIVE

## 2020-04-01 LAB — HEPATITIS B DNA, ULTRAQUANTITATIVE, PCR
Hepatitis B DNA (Calc): <1 Log IU/mL
Hepatitis B DNA: <10 IU/mL

## 2020-04-01 LAB — HEPATITIS B SURFACE ANTIGEN
Confirmation: REACTIVE — AB
Hepatitis B Surface Ag: REACTIVE — AB

## 2020-04-01 LAB — HIV-1 RNA QUANT-NO REFLEX-BLD
HIV 1 RNA Quant: <20 Copies/mL — ABNORMAL HIGH
HIV-1 RNA Quant, Log: <1.3 Log cps/mL — ABNORMAL HIGH

## 2020-04-01 LAB — HEPATITIS B E ANTIGEN: Hep B E Ag: NONREACTIVE

## 2020-04-05 ENCOUNTER — Ambulatory Visit (INDEPENDENT_AMBULATORY_CARE_PROVIDER_SITE_OTHER): Payer: BC Managed Care – PPO | Admitting: Family

## 2020-04-05 ENCOUNTER — Encounter: Payer: Self-pay | Admitting: Family

## 2020-04-05 ENCOUNTER — Other Ambulatory Visit: Payer: Self-pay

## 2020-04-05 VITALS — BP 132/88 | HR 83 | Wt 211.0 lb

## 2020-04-05 DIAGNOSIS — B181 Chronic viral hepatitis B without delta-agent: Secondary | ICD-10-CM | POA: Diagnosis not present

## 2020-04-05 DIAGNOSIS — Z113 Encounter for screening for infections with a predominantly sexual mode of transmission: Secondary | ICD-10-CM | POA: Diagnosis not present

## 2020-04-05 DIAGNOSIS — B20 Human immunodeficiency virus [HIV] disease: Secondary | ICD-10-CM

## 2020-04-05 DIAGNOSIS — Z0000 Encounter for general adult medical examination without abnormal findings: Secondary | ICD-10-CM | POA: Insufficient documentation

## 2020-04-05 MED ORDER — ELVITEG-COBIC-EMTRICIT-TENOFAF 150-150-200-10 MG PO TABS: 1.0000 | ORAL_TABLET | Freq: Every day | ORAL | 6 refills | Status: DC

## 2020-04-05 NOTE — Assessment & Plan Note (Signed)
Phillip Gill continues to have well-controlled HIV disease with good adherence and tolerance to his ART regimen of Genvoya.  No signs/symptoms of opportunistic infection or progressive HIV disease.  We reviewed lab work and discussed the plan of care.  Co-pay card provided to assist with medication payment.  Continue current dose of Genvoya.  Plan for follow-up in 4 months or sooner if needed with lab work 1 to 2 weeks prior to appointment.

## 2020-04-05 NOTE — Patient Instructions (Signed)
Nice to see you.  Continue to take your Genvoya daily as prescribed.  Please activate your co-pay card and if you need assistance please contact our pharmacy staff.  Refills of medication will be sent to the pharmacy.  Plan for follow-up in 4 months or sooner if needed with lab work 1 to 2 weeks prior to your appointment.  Have a great day and stay safe!

## 2020-04-05 NOTE — Assessment & Plan Note (Signed)
Mr. Woldt has well-controlled hepatitis B with no viral load at present and no signs of exacerbation.  Delta antibody testing negative.  Fibrosis score of F0.  Continue current dose of Genvoya containing tenofovir for hepatitis B.

## 2020-04-05 NOTE — Assessment & Plan Note (Signed)
   Discussed importance of safe sexual practice to reduce risk of STI.  Condoms provided.  Declines influenza vaccination and booster Covid vaccination today.  Encouraged her to complete routine dental care as he has [employer], [employer]/television/film.

## 2020-04-05 NOTE — Progress Notes (Signed)
Subjective:    Patient ID: Phillip Gill, male    DOB: Jan 17, 1976, 44 y.o.   MRN: [id number]  Chief Complaint  Patient presents with  . Follow-up    No questions or concerns     HPI:  Phillip Gill is a 44 y.o. male with HIV disease and chronic hepatitis B who was last seen in the office on 12/24/2019 with good adherence and tolerance to his ART regimen of Genvoya.  Viral load at the time was undetectable with CD4 count of 1187.  Most recent blood work completed on 03/22/2020 with a CD4 count of 1350 and viral load that was undetectable.  Also noted to have a cyst on his foot and was referred to podiatry and has since had it surgically removed.  Here today for routine follow-up.  Phillip Gill continues to take his Genvoya daily as prescribed with no adverse side effects or missed doses since his last office visit.  Overall feeling well today with no new concerns/complaints.  Continues to recover well from surgery. Denies fevers, chills, night sweats, headaches, changes in vision, neck pain/stiffness, nausea, diarrhea, vomiting, lesions or rashes.  Phillip Gill has been out of work for the last couple of weeks and has had a $40 co-pay for his medications.  He does not currently have a co-pay card.  Denies feelings of being down, depressed, or hopeless recently.  No recreational or illicit drug use, and continues to smoke tobacco and drink alcohol on occasion.  Due for routine dental care.  Condoms provided.  Has not received influenza vaccination and will get it at work.   Allergies  Allergen Reactions  . Bactrim [Sulfamethoxazole W/Trimethoprim (Co-Trimoxazole)] Hives and Shortness Of Breath  . Sulfa Antibiotics Hives and Shortness Of Breath  . Cymbalta [Duloxetine Hcl] Diarrhea  . Duloxetine Diarrhea  . Other Hives and Swelling    [employer] toothpaste   . Zoloft [Sertraline Hcl] Diarrhea  . Neurontin [Gabapentin] Rash      Outpatient Medications Prior to Visit  Medication  Sig Dispense Refill  . elvitegravir-cobicistat-emtricitabine-tenofovir (GENVOYA) 150-150-200-10 MG TABS tablet Take 1 tablet by mouth daily with breakfast. 30 tablet 6  . acetaminophen (TYLENOL) 325 MG tablet Take 650 mg by mouth every 6 (six) hours as needed. (Patient not taking: Reported on 04/05/2020)    . ALPRAZolam (XANAX) 0.25 MG tablet Take by mouth. (Patient not taking: Reported on 04/05/2020)    . amLODipine-benazepril (LOTREL) 10-20 MG capsule Take 1 capsule by mouth daily. (Patient not taking: Reported on 04/05/2020) 30 capsule 1  . amoxicillin-clavulanate (AUGMENTIN) 875-125 MG tablet Take 1 tablet by mouth 2 (two) times daily. (Patient not taking: Reported on 04/05/2020) 20 tablet 0  . buPROPion (WELLBUTRIN SR) 150 MG 12 hr tablet Take 1 tablet by mouth daily for 3 days then increase to 1 tablet by mouth twice daily. Plan to quit smoking during second week and continue for 12 weeks beyond quit date. (Patient not taking: Reported on 04/05/2020) 60 tablet 2  . meloxicam (MOBIC) 15 MG tablet Take 1 tablet (15 mg total) by mouth daily. (Patient not taking: Reported on 04/05/2020) 30 tablet 1  . nicotine polacrilex (NICORETTE) 4 MG gum Take by mouth. (Patient not taking: Reported on 04/05/2020)    . oxyCODONE-acetaminophen (PERCOCET) 5-325 MG tablet Take 1 tablet by mouth every 4 (four) hours as needed for severe pain. (Patient not taking: Reported on 04/05/2020) 30 tablet 0  . pantoprazole (PROTONIX) 20 MG tablet Take 1 tablet (20  mg total) by mouth daily. 30 tablet 0  . darunavir-cobicistat (PREZCOBIX) 800-150 MG tablet Take by mouth. (Patient not taking: Reported on 04/05/2020)    . emtricitabine-tenofovir (TRUVADA) 200-300 MG tablet Take by mouth. (Patient not taking: Reported on 04/05/2020)     No facility-administered medications prior to visit.     Past Medical History:  Diagnosis Date  . Anxiety   . Arthritis    "neck" (02/16/2015)  . 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  Chronic lower back pain   . Depression   . Fibromyalgia   . Genital warts   . HIV disease (HCC) 02/28/2015  . HIV infection (HCC)    followed by Dr. Luciana Axe- sees him every 4 months  . Hypertension   . IBS (irritable bowel syndrome)   . Migraine    "none in years" (02/16/2015  . Overweight 07/20/2015  . Renal insufficiency 12/29/2013     Past Surgical History:  Procedure Laterality Date  . CO2 LASER APPLICATION N/A 02/11/2013   Procedure: CO2 LASER APPLICATION;  Surgeon: Romie Levee, MD;  Location: [facility];  Service: General;  Laterality: N/A;  . HIGH RESOLUTION ANOSCOPY N/A 02/11/2013   Procedure: HIGH RESOLUTION ANOSCOPY WITH BIOPSY, LASER ABLATION;  Surgeon: Romie Levee, MD;  Location: [facility];  Service: General;  Laterality: N/A;  . WISDOM TOOTH EXTRACTION         Review of Systems  Constitutional: Negative for appetite change, chills, fatigue, fever and unexpected weight change.  Eyes: Negative for visual disturbance.  Respiratory: Negative for cough, chest tightness, shortness of breath and wheezing.   Cardiovascular: Negative for chest pain and leg swelling.  Gastrointestinal: Negative for abdominal pain, constipation, diarrhea, nausea and vomiting.  Genitourinary: Negative for dysuria, flank pain, frequency, genital sores, hematuria and urgency.  Skin: Negative for rash.  Allergic/Immunologic: Negative for immunocompromised state.  Neurological: Negative for dizziness and headaches.      Objective:    BP 132/88   Pulse 83   Wt 211 lb (95.7 kg)   SpO2 98%   BMI 28.62 kg/m  Nursing note and vital signs reviewed.  Physical Exam Constitutional:      General: He is not in acute distress.    Appearance: He is well-developed.  Eyes:     Conjunctiva/sclera: Conjunctivae normal.  Cardiovascular:     Rate and Rhythm: Normal rate and regular rhythm.     Heart sounds: Normal heart sounds. No murmur heard.  No friction rub. No  gallop.   Pulmonary:     Effort: Pulmonary effort is normal. No respiratory distress.     Breath sounds: Normal breath sounds. No wheezing or rales.  Chest:     Chest wall: No tenderness.  Abdominal:     General: Bowel sounds are normal.     Palpations: Abdomen is soft.     Tenderness: There is no abdominal tenderness.  Musculoskeletal:     Cervical back: Neck supple.  Lymphadenopathy:     Cervical: No cervical adenopathy.  Skin:    General: Skin is warm and dry.     Findings: No rash.  Neurological:     Mental Status: He is alert and oriented to person, place, and time.  Psychiatric:        Behavior: Behavior normal.        Thought Content: Thought content normal.        Judgment: Judgment normal.      Depression  [facility] 04/05/2020 12/24/2019 01/29/2018 07/31/2017 04/29/2017  Decreased Interest 0 0 0 1 1  Down, Depressed, Hopeless 0 0 0 1 1  PHQ - 2 Score 0 0 0 2 2  Altered sleeping - - - 0 1  Tired, decreased energy - - - 1 2  Change in appetite - - - 0 1  Feeling bad or failure about yourself  - - - 1 3  Trouble concentrating - - - 0 1  Moving slowly or fidgety/restless - - - 0 0  Suicidal thoughts - - - 0 0  PHQ-9 Score - - - 4 10  Difficult doing work/chores - - - Somewhat difficult Somewhat difficult  Some recent data might be hidden       Assessment & Plan:    Patient Active Problem List   Diagnosis Date Noted  . Healthcare maintenance 04/05/2020  . Foot lesion 12/24/2019  . Human immunodeficiency virus (HIV) disease (HCC) 08/25/2019  . Gonorrhea 02/11/2019  . Hepatitis B surface antigen positive 09/05/2018  . Overweight 07/20/2015  . Colitis 03/08/2015  . HIV (human immunodeficiency virus infection) (HCC)   . Tobacco abuse   . LGV (lymphogranuloma venereum)   . Primary syphilis   . Chronic hepatitis B (HCC) 10/03/2014  . HTN (hypertension)   . Gynecomastia 04/30/2014  . 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 Chronic pain associated with significant  psychosocial dysfunction 09/18/2012  . Fibromyalgia muscle pain 02/11/2012  . Depression 12/07/2011  . Anxiety 02/02/2011     Problem List Items Addressed This Visit      Digestive   Chronic hepatitis B (HCC) (Chronic)    [name] has well-controlled hepatitis B with no viral load at present and no signs of exacerbation.  Delta antibody testing negative.  Fibrosis score of F0.  Continue current dose of Genvoya containing tenofovir for hepatitis B.      Relevant Medications   elvitegravir-cobicistat-emtricitabine-tenofovir (GENVOYA) 150-150-200-10 MG TABS tablet   Other Relevant Orders   Comprehensive metabolic panel     Other   Human immunodeficiency virus (HIV) disease (HCC) (Chronic)    [name] continues to have well-controlled HIV disease with good adherence and tolerance to his ART regimen of Genvoya.  No signs/symptoms of opportunistic infection or progressive HIV disease.  We reviewed lab work and discussed the plan of care.  Co-pay card provided to assist with medication payment.  Continue current dose of Genvoya.  Plan for follow-up in 4 months or sooner if needed with lab work 1 to 2 weeks prior to appointment.      Relevant Medications   elvitegravir-cobicistat-emtricitabine-tenofovir (GENVOYA) 150-150-200-10 MG TABS tablet   Other Relevant Orders   HIV-1 RNA quant-no reflex-bld   T-helper cell (CD4)- ([facility] clinic only)   Lipid panel   Comprehensive metabolic panel   Healthcare maintenance     Discussed importance of safe sexual practice to reduce risk of STI.  Condoms provided.  Declines influenza vaccination and booster Covid vaccination today.  Encouraged her to complete routine dental care as he has Production designer, theatre/television/filmcommercial dental insurance.       Other Visit Diagnoses    Screening for STDs (sexually transmitted diseases)    -  Primary   Relevant Orders   RPR       I have discontinued Treston M. Rosekrans's darunavir-cobicistat and emtricitabine-tenofovir. I am  also having him maintain his amLODipine-benazepril, acetaminophen, pantoprazole, buPROPion, amoxicillin-clavulanate, ALPRAZolam, nicotine polacrilex, oxyCODONE-acetaminophen, meloxicam, and elvitegravir-cobicistat-emtricitabine-tenofovir.   Meds ordered this encounter  Medications  . elvitegravir-cobicistat-emtricitabine-tenofovir (GENVOYA) 150-150-200-10 MG TABS tablet    Sig: Take 1 tablet by mouth daily with breakfast.    Dispense:  30 tablet    Refill:  6    Order Specific Question:   Supervising Provider    Answer:   Judyann Munson [[id number]]     Follow-up: Return in about 4 months (around 08/06/2020), or if symptoms worsen or fail to improve.   Marcos Eke, MSN, FNP-C Nurse Practitioner [facility] Main number: [phone]

## 2020-05-18 ENCOUNTER — Telehealth: Payer: Self-pay

## 2020-05-18 NOTE — Telephone Encounter (Signed)
[facility] Patient Advocate Encounter   Was successful in obtaining a [employer] copay card for GENVOYA.  This copay card will make the patients copay 0.00.  I have spoken with the patient.        Ashey Tramontana, CPhT [facility] Patient Advocate [facility] Phone: [phone] Fax:  [phone]  

## 2020-05-18 NOTE — Telephone Encounter (Signed)
I will call the Patient back and give him the Co-Pay Coupon Card information. Thank you

## 2020-05-18 NOTE — Telephone Encounter (Signed)
Patient called with concerns regarding $30.00 copay for Genvoya. RN informed patient that pharmacy team will reach out regarding assistance.  Sandie Ano, RN

## 2020-05-24 ENCOUNTER — Telehealth: Payer: Self-pay

## 2020-05-24 NOTE — Telephone Encounter (Signed)
Patient called stating that there is still a $47 copay for his Genvoya. RN asked if he gave the pharmacy the information on the copay card that [facility] sent him. Patient states he will call the pharmacy again with that information.   Sandie Ano, RN

## 2020-07-22 ENCOUNTER — Ambulatory Visit: Payer: BC Managed Care – PPO | Admitting: Podiatry

## 2020-07-22 ENCOUNTER — Other Ambulatory Visit: Payer: Self-pay

## 2020-08-02 ENCOUNTER — Ambulatory Visit: Payer: BC Managed Care – PPO | Admitting: Podiatry

## 2020-08-15 ENCOUNTER — Other Ambulatory Visit: Payer: BC Managed Care – PPO

## 2020-08-15 ENCOUNTER — Other Ambulatory Visit: Payer: Self-pay

## 2020-08-15 DIAGNOSIS — B181 Chronic viral hepatitis B without delta-agent: Secondary | ICD-10-CM

## 2020-08-15 DIAGNOSIS — B20 Human immunodeficiency virus [HIV] disease: Secondary | ICD-10-CM

## 2020-08-15 DIAGNOSIS — Z113 Encounter for screening for infections with a predominantly sexual mode of transmission: Secondary | ICD-10-CM

## 2020-08-16 ENCOUNTER — Ambulatory Visit: Payer: BC Managed Care – PPO | Admitting: Podiatry

## 2020-08-16 LAB — T-HELPER CELL (CD4) - (RCID CLINIC ONLY)
CD4 % Helper T Cell: 40 % (ref 33–65)
CD4 T Cell Abs: 1474 /uL (ref 400–1790)

## 2020-08-18 LAB — COMPREHENSIVE METABOLIC PANEL
AG Ratio: 1.4 (calc) (ref 1.0–2.5)
ALT: 16 U/L (ref 9–46)
AST: 21 U/L (ref 10–40)
Albumin: 4.2 g/dL (ref 3.6–5.1)
Alkaline phosphatase (APISO): 78 U/L (ref 36–130)
BUN: 14 mg/dL (ref 7–25)
CO2: 29 mmol/L (ref 20–32)
Calcium: 9.2 mg/dL (ref 8.6–10.3)
Chloride: 104 mmol/L (ref 98–110)
Creat: 1.28 mg/dL (ref 0.60–1.35)
Globulin: 3.1 g/dL (calc) (ref 1.9–3.7)
Glucose, Bld: 94 mg/dL (ref 65–99)
Potassium: 3.8 mmol/L (ref 3.5–5.3)
Sodium: 139 mmol/L (ref 135–146)
Total Bilirubin: 0.3 mg/dL (ref 0.2–1.2)
Total Protein: 7.3 g/dL (ref 6.1–8.1)

## 2020-08-18 LAB — RPR: RPR Ser Ql: NONREACTIVE

## 2020-08-18 LAB — HIV-1 RNA QUANT-NO REFLEX-BLD
HIV 1 RNA Quant: <20 Copies/mL — ABNORMAL HIGH
HIV-1 RNA Quant, Log: <1.3 Log cps/mL — ABNORMAL HIGH

## 2020-08-18 LAB — LIPID PANEL
Cholesterol: 179 mg/dL (ref <200)
HDL: 28 mg/dL — ABNORMAL LOW (ref >=40)
Non-HDL Cholesterol (Calc): 151 mg/dL (calc) — ABNORMAL HIGH (ref <130)
Total CHOL/HDL Ratio: 6.4 (calc) — ABNORMAL HIGH (ref <5.0)
Triglycerides: 629 mg/dL — ABNORMAL HIGH (ref <150)

## 2020-08-29 ENCOUNTER — Encounter: Payer: Self-pay | Admitting: Family

## 2020-08-29 ENCOUNTER — Telehealth: Payer: Self-pay

## 2020-08-29 ENCOUNTER — Other Ambulatory Visit: Payer: Self-pay

## 2020-08-29 ENCOUNTER — Ambulatory Visit (INDEPENDENT_AMBULATORY_CARE_PROVIDER_SITE_OTHER): Payer: BC Managed Care – PPO | Admitting: Family

## 2020-08-29 VITALS — BP 132/89 | HR 94 | Temp 98.1°F | Ht 72.0 in | Wt 206.0 lb

## 2020-08-29 DIAGNOSIS — N521 Erectile dysfunction due to diseases classified elsewhere: Secondary | ICD-10-CM

## 2020-08-29 DIAGNOSIS — N529 Male erectile dysfunction, unspecified: Secondary | ICD-10-CM | POA: Insufficient documentation

## 2020-08-29 DIAGNOSIS — Z0000 Encounter for general adult medical examination without abnormal findings: Secondary | ICD-10-CM

## 2020-08-29 DIAGNOSIS — Z21 Asymptomatic human immunodeficiency virus [HIV] infection status: Secondary | ICD-10-CM | POA: Diagnosis not present

## 2020-08-29 DIAGNOSIS — L989 Disorder of the skin and subcutaneous tissue, unspecified: Secondary | ICD-10-CM

## 2020-08-29 MED ORDER — BICTEGRAVIR-EMTRICITAB-TENOFOV 50-200-25 MG PO TABS: 1.0000 | ORAL_TABLET | Freq: Every day | ORAL | 3 refills | Status: DC

## 2020-08-29 MED ORDER — SILDENAFIL CITRATE 50 MG PO TABS: 50.0000 mg | ORAL_TABLET | Freq: Every day | ORAL | 0 refills | Status: DC | PRN

## 2020-08-29 MED ORDER — SILDENAFIL CITRATE 50 MG PO TABS
50.0000 mg | ORAL_TABLET | Freq: Every day | ORAL | 0 refills | Status: DC | PRN
Start: 2020-08-29 — End: 2020-08-29

## 2020-08-29 NOTE — Telephone Encounter (Signed)
Once he activates that card it will make his co-pay $0.00

## 2020-08-29 NOTE — Patient Instructions (Addendum)
Nice to see you.  We will change your Genovya to [place].   Start taking Viagra as needed.   Plan for follow up in 4 months or sooner if needed.   Recommend follow up with Dr. Logan Bores for your foot.  Have a great day and stay safe!

## 2020-08-29 NOTE — Assessment & Plan Note (Signed)
Mr. Mulvehill has erectile dysfunction likely multifactorial including hypertension, HIV, and pharmacological therapy.  Interactions with the phosphodiesterase inhibitors and cobicistat.  Change Genvoya to [employer].  Discussed importance of monitoring for erections lasting longer than 4 hours as this is a medical emergency.  Encouraged control of comorbid conditions including hypertension and recommended weight loss as there is likely insulin resistance.

## 2020-08-29 NOTE — Telephone Encounter (Signed)
[employer] copay card activated for copay assistance for [place] [address]. Patient states that he has a $30 copay with insurance. Instructed him to use [facility] or [facility] for Viagra prescription assistance.   Dorie Loyola Mast, RN

## 2020-08-29 NOTE — Assessment & Plan Note (Signed)
[name] continues to have well-controlled HIV disease with good adherence and tolerance to his ART regimen of Genvoya.  We reviewed lab work and discussed plan of care.  No signs/symptoms of opportunistic infection or progressive HIV disease.  Change medication today to Biktarvy secondary to interactions with erectile dysfunction medications.  Plan for follow-up in 4 months or sooner if needed with lab work 1 to 2 weeks prior to appointment.

## 2020-08-29 NOTE — Progress Notes (Signed)
Subjective:    Patient ID: Phillip Gill, male    DOB: 1976-01-25, 45 y.o.   MRN: [id number]  Chief Complaint  Patient presents with  . Follow-up    Given condoms      HPI:  Phillip Gill is a 45 y.o. male with HIV disease last seen on 04/05/2020 with well-controlled HIV with good adherence and tolerance to his ART regimen of Genvoya.  Viral load at the time was undetectable CD4 count of 1350.  Most recent blood work completed on 08/15/2020 with viral load remains undetectable and CD4 count of 1474.  RPR nonreactive for syphilis.  Kidney function, liver function, electrolytes within normal ranges.  Lipid profile with triglycerides of 629 and LDL unable to be calculated.  Here today for routine follow-up.  Phillip Gill continues to take his Genvoya daily as prescribed with no adverse side effects and missed several doses of medication secondary to issues with obtaining medication from the pharmacy.  That has since been resolved.  Feeling well today with concerns for a lesion located on his left foot that was previously operated on by podiatry as well as chronic erectile dysfunction for which he has problems obtaining and maintaining erections at times that has been going on for 1 year..  He has not taken any medications or attempted to treat this. Denies fevers, chills, night sweats, headaches, changes in vision, neck pain/stiffness, nausea, diarrhea, vomiting, lesions or rashes.  Phillip Gill continues to have coverage through [facility].  Issues with pharmacy have been resolved.  Denies feelings of being down, depressed, or hopeless recently.  No recreational or illicit drug use and occasional alcohol consumption.  Smokes approximately one third a pack of cigarettes per day.   Allergies  Allergen Reactions  . Bactrim [Sulfamethoxazole W/Trimethoprim (Co-Trimoxazole)] Hives and Shortness Of Breath  . Sulfa Antibiotics Hives and Shortness Of Breath  . Cymbalta [Duloxetine  Hcl] Diarrhea  . Duloxetine Diarrhea  . Other Hives and Swelling    [employer] toothpaste   . Zoloft [Sertraline Hcl] Diarrhea  . Neurontin [Gabapentin] Rash      Outpatient Medications Prior to Visit  Medication Sig Dispense Refill  . amLODipine-benazepril (LOTREL) 10-20 MG capsule Take 1 capsule by mouth daily. 30 capsule 1  . elvitegravir-cobicistat-emtricitabine-tenofovir (GENVOYA) 150-150-200-10 MG TABS tablet Take 1 tablet by mouth daily with breakfast. 30 tablet 6  . acetaminophen (TYLENOL) 325 MG tablet Take 650 mg by mouth every 6 (six) hours as needed. (Patient not taking: No sig reported)    . ALPRAZolam (XANAX) 0.25 MG tablet Take by mouth. (Patient not taking: No sig reported)    . amoxicillin-clavulanate (AUGMENTIN) 875-125 MG tablet Take 1 tablet by mouth 2 (two) times daily. (Patient not taking: No sig reported) 20 tablet 0  . buPROPion (WELLBUTRIN SR) 150 MG 12 hr tablet Take 1 tablet by mouth daily for 3 days then increase to 1 tablet by mouth twice daily. Plan to quit smoking during second week and continue for 12 weeks beyond quit date. (Patient not taking: No sig reported) 60 tablet 2  . meloxicam (MOBIC) 15 MG tablet Take 1 tablet (15 mg total) by mouth daily. (Patient not taking: No sig reported) 30 tablet 1  . nicotine polacrilex (NICORETTE) 4 MG gum Take by mouth. (Patient not taking: No sig reported)    . oxyCODONE-acetaminophen (PERCOCET) 5-325 MG tablet Take 1 tablet by mouth every 4 (four) hours as needed for severe pain. (Patient not taking: No sig reported)  30 tablet 0  . pantoprazole (PROTONIX) 20 MG tablet Take 1 tablet (20 mg total) by mouth daily. 30 tablet 0   No facility-administered medications prior to visit.     Past Medical History:  Diagnosis Date  . Anxiety   . Arthritis    "neck" (02/16/2015)  . 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 Chronic lower back pain   . Depression   . Fibromyalgia   . Genital warts   . HIV disease (HCC)  02/28/2015  . HIV infection (HCC)    followed by Dr. Luciana Axeomer- sees him every 4 months  . Hypertension   . IBS (irritable bowel syndrome)   . Migraine    "none in years" (02/16/2015  . Overweight 07/20/2015  . Renal insufficiency 12/29/2013     Past Surgical History:  Procedure Laterality Date  . CO2 LASER APPLICATION N/A 02/11/2013   Procedure: CO2 LASER APPLICATION;  Surgeon: Romie LeveeAlicia Thomas, MD;  [facility];  Service: General;  Laterality: N/A;  . HIGH RESOLUTION ANOSCOPY N/A 02/11/2013   Procedure: HIGH RESOLUTION ANOSCOPY WITH BIOPSY, LASER ABLATION;  Surgeon: Romie LeveeAlicia Thomas, MD;  Location: [facility];  Service: General;  Laterality: N/A;  . WISDOM TOOTH EXTRACTION      Review of Systems  Constitutional: Negative for appetite change, chills, fatigue, fever and unexpected weight change.  Eyes: Negative for visual disturbance.  Respiratory: Negative for cough, chest tightness, shortness of breath and wheezing.   Cardiovascular: Negative for chest pain and leg swelling.  Gastrointestinal: Negative for abdominal pain, constipation, diarrhea, nausea and vomiting.  Genitourinary: Negative for dysuria, flank pain, frequency, genital sores, hematuria and urgency.  Skin: Negative for rash.  Allergic/Immunologic: Negative for immunocompromised state.  Neurological: Negative for dizziness and headaches.      Objective:    BP 132/89   Pulse 94   Temp 98.1 F (36.7 C) (Oral)   Ht 6' (1.829 m)   Wt 206 lb (93.4 kg)   SpO2 95%   BMI 27.94 kg/m  Nursing note and vital signs reviewed.  Physical Exam Constitutional:      General: He is not in acute distress.    Appearance: He is well-developed.  HENT:     Mouth/Throat:     Mouth: Oropharynx is clear and moist.  Eyes:     Conjunctiva/sclera: Conjunctivae normal.  Cardiovascular:     Rate and Rhythm: Normal rate and regular rhythm.     Pulses: Intact distal pulses.     Heart sounds: Normal  heart sounds. No murmur heard. No friction rub. No gallop.   Pulmonary:     Effort: Pulmonary effort is normal. No respiratory distress.     Breath sounds: Normal breath sounds. No wheezing or rales.  Chest:     Chest wall: No tenderness.  Abdominal:     General: Bowel sounds are normal.     Palpations: Abdomen is soft.     Tenderness: There is no abdominal tenderness.  Musculoskeletal:     Cervical back: Neck supple.     Comments: Small cyst/lesion located near the top of the incision site on the left foot. Firm and tender to the touch. No induration.   Lymphadenopathy:     Cervical: No cervical adenopathy.  Skin:    General: Skin is warm and dry.     Findings: No rash.  Neurological:     Mental Status: He is alert and oriented to person, place, and  time.  Psychiatric:        Mood and Affect: Mood and affect normal.        Behavior: Behavior normal.        Thought Content: Thought content normal.        Judgment: Judgment normal.      Depression screen [facility] 2/9 08/29/2020 04/05/2020 12/24/2019 01/29/2018 07/31/2017  Decreased Interest 0 0 0 0 1  Down, Depressed, Hopeless 0 0 0 0 1  PHQ - 2 Score 0 0 0 0 2  Altered sleeping - - - - 0  Tired, decreased energy - - - - 1  Change in appetite - - - - 0  Feeling bad or failure about yourself  - - - - 1  Trouble concentrating - - - - 0  Moving slowly or fidgety/restless - - - - 0  Suicidal thoughts - - - - 0  PHQ-9 Score - - - - 4  Difficult doing work/chores - - - - Somewhat difficult  Some recent data might be hidden       Assessment & Plan:    Patient Active Problem List   Diagnosis Date Noted  . Erectile dysfunction 08/29/2020  . Healthcare maintenance 04/05/2020  . Foot lesion 12/24/2019  . Human immunodeficiency virus (HIV) disease (HCC) 08/25/2019  . Gonorrhea 02/11/2019  . Hepatitis B surface antigen positive 09/05/2018  . Overweight 07/20/2015  . Colitis 03/08/2015  . HIV (human immunodeficiency virus infection)  (HCC)   . Tobacco abuse   . LGV (lymphogranuloma venereum)   . Primary syphilis   . Chronic hepatitis B (HCC) 10/03/2014  . HTN (hypertension)   . Gynecomastia 04/30/2014  . 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 Chronic pain associated with significant psychosocial dysfunction 09/18/2012  . Fibromyalgia muscle pain 02/11/2012  . Depression 12/07/2011  . Anxiety 02/02/2011     Problem List Items Addressed This Visit      Other   HIV (human immunodeficiency virus infection) [facility])    Mr. Colgan continues to have well-controlled HIV disease with good adherence and tolerance to his ART regimen of Genvoya.  We reviewed lab work and discussed plan of care.  No signs/symptoms of opportunistic infection or progressive HIV disease.  Change medication today to Biktarvy secondary to interactions with erectile dysfunction medications.  Plan for follow-up in 4 months or sooner if needed with lab work 1 to 2 weeks prior to appointment.      Relevant Medications   bictegravir-emtricitabine-tenofovir AF (BIKTARVY) 50-200-25 MG TABS tablet   Foot lesion    Mr. Every had a foot lesion status post surgical intervention now with a small cyst/nodule.  Recommended donut pad to take pressure off of site while awaiting recommended follow-up with podiatry.      Healthcare maintenance     Discussed importance of safe sexual practice to reduce risk of STI.  Condoms provided.  Routine dental care is up-to-date per recommendations.      Erectile dysfunction    Mr. Fauth has erectile dysfunction likely multifactorial including hypertension, HIV, and pharmacological therapy.  Interactions with the phosphodiesterase inhibitors and cobicistat.  Change Genvoya to [employer].  Discussed importance of monitoring for erections lasting longer than 4 hours as this is a medical emergency.  Encouraged control of comorbid conditions including hypertension and recommended weight loss as there is likely insulin resistance.           I have discontinued Melven M. Siebers's acetaminophen, pantoprazole, buPROPion, amoxicillin-clavulanate, ALPRAZolam, nicotine polacrilex, oxyCODONE-acetaminophen, meloxicam,  and elvitegravir-cobicistat-emtricitabine-tenofovir. I am also having him start on sildenafil and bictegravir-emtricitabine-tenofovir AF. Additionally, I am having him maintain his amLODipine-benazepril.   Meds ordered this encounter  Medications  . sildenafil (VIAGRA) 50 MG tablet    Sig: Take 1 tablet (50 mg total) by mouth daily as needed for erectile dysfunction.    Dispense:  10 tablet    Refill:  0    Order Specific Question:   Supervising Provider    Answer:   Judyann Munson [[id number]]  . bictegravir-emtricitabine-tenofovir AF (BIKTARVY) 50-200-25 MG TABS tablet    Sig: Take 1 tablet by mouth daily.    Dispense:  30 tablet    Refill:  3    Order Specific Question:   Supervising Provider    Answer:   Judyann Munson [[id number]]     Follow-up: Return in about 4 weeks (around 09/26/2020), or if symptoms worsen or fail to improve.   Marcos Eke, MSN, FNP-C Nurse Practitioner [facility] Main number: [phone]

## 2020-08-29 NOTE — Telephone Encounter (Signed)
Received voicemail from patient today stating medication he was prescribed is not covered under his insurance. Attempted to call patient to discuss which medication he had issues getting. Left voicemail requesting call back. Spoke with Marylene Land at [facility] who states patient's Sildanfil is not covered. Office does not offer assistance for this medication. Will offer Goodrx card or have patient contact [facility].

## 2020-08-29 NOTE — Assessment & Plan Note (Signed)
   Discussed importance of safe sexual practice to reduce risk of STI.  Condoms provided.  Routine dental care is up-to-date per recommendations.

## 2020-08-29 NOTE — Telephone Encounter (Signed)
Patient started on Biktarvy. With insurance patient has a copay of $30. Applied for [employer] co pay assistance card however pharmacy states it's not activated despite receiving notice that it is. Information is below  [id number] PCN: Access GRP: [id number] Issuer: [id number]  ID: [id number]  Forwarding to pharmacy team to review and assist as able.   Phillip Christine Loyola Mast, RN

## 2020-08-29 NOTE — Telephone Encounter (Signed)
Spoke with Lupita Leash on our pharmacy team who was able to provide previous gilead copay assistance information to [facility] making co-pay $0. Attempted to call patient to notify of change; didn't pick up.   Wilma Wuthrich Loyola Mast, RN

## 2020-08-29 NOTE — Assessment & Plan Note (Signed)
Phillip Gill had a foot lesion status post surgical intervention now with a small cyst/nodule.  Recommended donut pad to take pressure off of site while awaiting recommended follow-up with podiatry.

## 2020-11-21 ENCOUNTER — Other Ambulatory Visit: Payer: Self-pay

## 2020-11-21 ENCOUNTER — Ambulatory Visit (HOSPITAL_COMMUNITY)
Admission: EM | Admit: 2020-11-21 | Discharge: 2020-11-21 | Disposition: A | Discharge status: Home / Self Care | Payer: 59 | Attending: Family Medicine | Admitting: Family Medicine

## 2020-11-21 ENCOUNTER — Other Ambulatory Visit (HOSPITAL_COMMUNITY): Payer: Self-pay

## 2020-11-21 ENCOUNTER — Telehealth: Payer: Self-pay

## 2020-11-21 ENCOUNTER — Encounter (HOSPITAL_COMMUNITY): Payer: Self-pay

## 2020-11-21 DIAGNOSIS — I10 Essential (primary) hypertension: Secondary | ICD-10-CM

## 2020-11-21 DIAGNOSIS — H1032 Unspecified acute conjunctivitis, left eye: Secondary | ICD-10-CM | POA: Diagnosis not present

## 2020-11-21 MED ORDER — TOBRAMYCIN 0.3 % OP SOLN
1.0000 [drp] | Freq: Four times a day (QID) | OPHTHALMIC | 0 refills | Status: DC
  Filled 2020-11-21 (×2): qty 5, 25d supply, fill #0

## 2020-11-21 MED ORDER — AMLODIPINE BESY-BENAZEPRIL HCL 5-20 MG PO CAPS
1.0000 | ORAL_CAPSULE | Freq: Every day | ORAL | 0 refills | Status: DC
  Filled 2020-11-21: qty 30, 30d supply, fill #0

## 2020-11-21 MED ORDER — BICTEGRAVIR-EMTRICITAB-TENOFOV 50-200-25 MG PO TABS
1.0000 | ORAL_TABLET | Freq: Every day | ORAL | 1 refills | Status: DC
  Filled 2020-11-21: qty 30, 30d supply, fill #0

## 2020-11-21 NOTE — ED Provider Notes (Signed)
[facility]   [id number] 11/21/20 Arrival Time: 5462  ASSESSMENT & PLAN:  1. Acute bacterial conjunctivitis of left eye   2. Elevated blood pressure reading in office with diagnosis of hypertension    Begin: Meds ordered this encounter  Medications  . tobramycin (TOBREX) 0.3 % ophthalmic solution    Sig: Place 1 drop into the right eye every 6 (six) hours.    Dispense:  5 mL    Refill:  0    Ophthalmic drops per orders. Warm compress to eye(s). Local eye care discussed.  Recommend:  Follow-up Information    [facility]   Why: If symptoms worsen in any way or if your eye is worsening or failing to improve as anticipated. Contact information: [address] [place] [address] [phone]                Discharge Instructions      Your blood pressure was noted to be elevated during your visit today. If you are currently taking medication for high blood pressure, please ensure you are taking this as directed. If you do not have a history of high blood pressure and your blood pressure remains persistently elevated, you may need to begin taking a medication at some point. You may return here within the next few days to recheck if unable to see your primary care provider or if you do not have a one.  BP (!) 146/104 (BP Location: Left Arm)   Pulse 89   Temp 98.6 F (37 C) (Oral)   Resp 17   SpO2 97%   BP Readings from Last 3 Encounters:  11/21/20 (!) 146/104  08/29/20 132/89  04/05/20 132/88        Reviewed expectations re: course of current medical issues. Questions answered. Outlined signs and symptoms indicating need for more acute intervention. Patient verbalized understanding. After Visit Summary given.   SUBJECTIVE:  Phillip Gill is a 45 y.o. male who presents with complaint of LEFT eye irritation. Onset gradual, approximately 2-3 days ago. With reported thick yellowish drainage. Injury: no. Visual  changes: no. Contact lens use: no. Recent illness: no. Self treatment: none reported.  Increased blood pressure noted today. Reports that he is treated for HTN. He reports taking medications as instructed, no chest pain on exertion, no dyspnea on exertion, no orthostatic dizziness or lightheadedness, no orthopnea or paroxysmal nocturnal dyspnea and no palpitations.  OBJECTIVE:  Vitals:   11/21/20 1000  BP: (!) 146/104  Pulse: 89  Resp: 17  Temp: 98.6 F (37 C)  TempSrc: Oral  SpO2: 97%    General appearance: alert; no distress HEENT: [place]; AT; PERRLA; no restriction of the extraocular movements OS: without reported pain; with 1+ conjunctival injection; with opaque drainage; without gross corneal opacities; without limbal flush; without periorbital swelling or erythema; mild swelling of medial upper eyelid - question if he is developing a stye OD: normal Neck: supple without LAD Skin: warm and dry Psychological: alert and cooperative; normal mood and affect    Allergies  Allergen Reactions  . Bactrim [Sulfamethoxazole W/Trimethoprim (Co-Trimoxazole)] Hives and Shortness Of Breath  . Sulfa Antibiotics Hives and Shortness Of Breath  . Cymbalta [Duloxetine Hcl] Diarrhea  . Duloxetine Diarrhea  . Other Hives and Swelling    [employer] toothpaste   . Zoloft [Sertraline Hcl] Diarrhea  . Neurontin [Gabapentin] Rash    Past Medical History:  Diagnosis Date  . Anxiety   . Arthritis    "  neck" (02/16/2015)  . 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 Chronic lower back pain   . Depression   . Fibromyalgia   . Genital warts   . HIV disease (HCC) 02/28/2015  . HIV infection (HCC)    followed by Dr. Luciana Axe- sees him every 4 months  . Hypertension   . IBS (irritable bowel syndrome)   . Migraine    "none in years" (02/16/2015  . Overweight 07/20/2015  . Renal insufficiency 12/29/2013   Social History   Socioeconomic History  . Marital status: Single    Spouse name: Not  on file  . Number of children: Not on file  . Years of education: Not on file  . Highest education level: Not on file  Occupational History  . Not on file  Tobacco Use  . Smoking status: Current Every Day Smoker    Packs/day: 1.00    Years: 18.00    Pack years: 18.00    Types: Cigarettes  . Smokeless tobacco: Never Used  Vaping Use  . Vaping Use: Never used  Substance and Sexual Activity  . Alcohol use: Yes    Comment: "Once in a blue moon"  . Drug use: No  . Sexual activity: Not on file  Other Topics Concern  . Not on file  Social History Narrative   Grew up in 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    Finished HS.   Doing online classes with [employer]- studying accounting   Previously 2 years of classes at [employer].    Has Partner- [name]- together for 1 year.    Has 2 girls- (born in 2000).          Social Determinants of Health   Financial Resource Strain: Not on file  Food Insecurity: Not on file  Transportation Needs: Not on file  Physical Activity: Not on file  Stress: Not on file  Social Connections: Not on file  Intimate Partner Violence: Not on file   Family History  Problem Relation Age of Onset  . Hypertension Mother   . Diabetes Mother   . Stroke Mother        cerbral aneurysm  . Hypertension Brother   . Mental illness Neg Hx    Past Surgical History:  Procedure Laterality Date  . CO2 LASER APPLICATION N/A 02/11/2013   Procedure: CO2 LASER APPLICATION;  Surgeon: Romie Levee, MD;  Location: [facility];  Service: General;  Laterality: N/A;  . HIGH RESOLUTION ANOSCOPY N/A 02/11/2013   Procedure: HIGH RESOLUTION ANOSCOPY WITH BIOPSY, LASER ABLATION;  Surgeon: Romie Levee, MD;  Location: [facility];  Service: General;  Laterality: N/A;  . WISDOM TOOTH EXTRACTION       Mardella Layman, MD 11/21/20 1034

## 2020-11-21 NOTE — Discharge Instructions (Signed)
Your blood pressure was noted to be elevated during your visit today. If you are currently taking medication for high blood pressure, please ensure you are taking this as directed. If you do not have a history of high blood pressure and your blood pressure remains persistently elevated, you may need to begin taking a medication at some point. You may return here within the next few days to recheck if unable to see your primary care provider or if you do not have a one.  BP (!) 146/104 (BP Location: Left Arm)   Pulse 89   Temp 98.6 F (37 C) (Oral)   Resp 17   SpO2 97%   BP Readings from Last 3 Encounters:  11/21/20 (!) 146/104  08/29/20 132/89  04/05/20 132/88

## 2020-11-21 NOTE — ED Triage Notes (Signed)
Pt states he has not taken his BP medication.

## 2020-11-21 NOTE — ED Triage Notes (Signed)
Pt c/o left eye swelling and soreness x 3 days.  He states he started having discharge on Sunday.

## 2020-11-21 NOTE — Telephone Encounter (Signed)
[facility] Patient Advocate Encounter °  °Was successful in obtaining a Gilead copay card for Biktarvy.  This copay card will make the patients copay 0.00. ° °I have spoken with the patient.   ° °The billing information is as follows and has been shared with [facility]. ° ° ° ° ° ° ° °Phillip Gill, CPhT °Specialty Pharmacy Patient Advocate °[facility] °Phone: [phone] °Fax:  [phone]  °

## 2020-11-30 ENCOUNTER — Other Ambulatory Visit: Payer: Self-pay

## 2020-11-30 DIAGNOSIS — Z113 Encounter for screening for infections with a predominantly sexual mode of transmission: Secondary | ICD-10-CM

## 2020-11-30 DIAGNOSIS — B20 Human immunodeficiency virus [HIV] disease: Secondary | ICD-10-CM

## 2020-12-06 ENCOUNTER — Other Ambulatory Visit: Payer: 59

## 2020-12-06 ENCOUNTER — Other Ambulatory Visit (HOSPITAL_COMMUNITY)
Admission: RE | Admit: 2020-12-06 | Discharge: 2020-12-06 | Disposition: A | Discharge status: Home / Self Care | Payer: 59 | Source: Ambulatory Visit | Attending: Family | Admitting: Family

## 2020-12-06 ENCOUNTER — Other Ambulatory Visit: Payer: Self-pay

## 2020-12-06 DIAGNOSIS — Z113 Encounter for screening for infections with a predominantly sexual mode of transmission: Secondary | ICD-10-CM | POA: Diagnosis not present

## 2020-12-06 DIAGNOSIS — B20 Human immunodeficiency virus [HIV] disease: Secondary | ICD-10-CM | POA: Insufficient documentation

## 2020-12-07 LAB — T-HELPER CELL (CD4) - (RCID CLINIC ONLY)
CD4 % Helper T Cell: 30 % — ABNORMAL LOW (ref 33–65)
CD4 T Cell Abs: 1112 /uL (ref 400–1790)

## 2020-12-07 LAB — URINE CYTOLOGY ANCILLARY ONLY
Chlamydia: NEGATIVE
Comment: NEGATIVE
Comment: NORMAL
Neisseria Gonorrhea: NEGATIVE

## 2020-12-08 LAB — CBC WITH DIFFERENTIAL/PLATELET
Absolute Monocytes: 859 cells/uL (ref 200–950)
Basophils Absolute: 34 cells/uL (ref 0–200)
Basophils Relative: 0.3 %
Eosinophils Absolute: 102 cells/uL (ref 15–500)
Eosinophils Relative: 0.9 %
HCT: 42.3 % (ref 38.5–50.0)
Hemoglobin: 13.8 g/dL (ref 13.2–17.1)
Lymphs Abs: 4136 cells/uL — ABNORMAL HIGH (ref 850–3900)
MCH: 28.8 pg (ref 27.0–33.0)
MCHC: 32.6 g/dL (ref 32.0–36.0)
MCV: 88.1 fL (ref 80.0–100.0)
MPV: 10.6 fL (ref 7.5–12.5)
Monocytes Relative: 7.6 %
Neutro Abs: 6170 cells/uL (ref 1500–7800)
Neutrophils Relative %: 54.6 %
Platelets: 225 10*3/uL (ref 140–400)
RBC: 4.8 10*6/uL (ref 4.20–5.80)
RDW: 12.6 % (ref 11.0–15.0)
Total Lymphocyte: 36.6 %
WBC: 11.3 10*3/uL — ABNORMAL HIGH (ref 3.8–10.8)

## 2020-12-08 LAB — COMPLETE METABOLIC PANEL WITH GFR
AG Ratio: 1.2 (calc) (ref 1.0–2.5)
ALT: 21 U/L (ref 9–46)
AST: 23 U/L (ref 10–40)
Albumin: 4.2 g/dL (ref 3.6–5.1)
Alkaline phosphatase (APISO): 72 U/L (ref 36–130)
BUN: 21 mg/dL (ref 7–25)
CO2: 25 mmol/L (ref 20–32)
Calcium: 9.7 mg/dL (ref 8.6–10.3)
Chloride: 103 mmol/L (ref 98–110)
Creat: 1.27 mg/dL (ref 0.60–1.35)
GFR, Est African American: 79 mL/min/{1.73_m2} (ref >=60)
GFR, Est Non African American: 68 mL/min/{1.73_m2} (ref >=60)
Globulin: 3.5 g/dL (calc) (ref 1.9–3.7)
Glucose, Bld: 93 mg/dL (ref 65–99)
Potassium: 4.3 mmol/L (ref 3.5–5.3)
Sodium: 139 mmol/L (ref 135–146)
Total Bilirubin: 0.3 mg/dL (ref 0.2–1.2)
Total Protein: 7.7 g/dL (ref 6.1–8.1)

## 2020-12-08 LAB — HIV-1 RNA QUANT-NO REFLEX-BLD
HIV 1 RNA Quant: 515 Copies/mL — ABNORMAL HIGH
HIV-1 RNA Quant, Log: 2.71 Log cps/mL — ABNORMAL HIGH

## 2020-12-20 ENCOUNTER — Ambulatory Visit (INDEPENDENT_AMBULATORY_CARE_PROVIDER_SITE_OTHER): Payer: 59 | Admitting: Family

## 2020-12-20 ENCOUNTER — Encounter: Payer: Self-pay | Admitting: Family

## 2020-12-20 ENCOUNTER — Other Ambulatory Visit (HOSPITAL_COMMUNITY): Payer: Self-pay

## 2020-12-20 ENCOUNTER — Other Ambulatory Visit: Payer: Self-pay

## 2020-12-20 VITALS — BP 134/90 | HR 92 | Temp 98.4°F | Wt 192.0 lb

## 2020-12-20 DIAGNOSIS — Z0000 Encounter for general adult medical examination without abnormal findings: Secondary | ICD-10-CM

## 2020-12-20 DIAGNOSIS — N521 Erectile dysfunction due to diseases classified elsewhere: Secondary | ICD-10-CM

## 2020-12-20 DIAGNOSIS — B20 Human immunodeficiency virus [HIV] disease: Secondary | ICD-10-CM | POA: Diagnosis not present

## 2020-12-20 MED ORDER — SILDENAFIL CITRATE 100 MG PO TABS: 50.0000 mg | ORAL_TABLET | Freq: Every day | ORAL | 1 refills | Status: DC | PRN

## 2020-12-20 MED ORDER — BICTEGRAVIR-EMTRICITAB-TENOFOV 50-200-25 MG PO TABS
1.0000 | ORAL_TABLET | Freq: Every day | ORAL | 4 refills | Status: DC
Start: 2020-12-20 — End: 2021-02-07
  Filled 2020-12-20 – 2020-12-26 (×2): qty 30, 30d supply, fill #0
  Filled 2021-01-18: qty 30, 30d supply, fill #1

## 2020-12-20 NOTE — Assessment & Plan Note (Signed)
   Discussed importance of safe sexual practice to reduce risk of STI.  Condoms declined.  Routine dental care up-to-date per recommendations.  Discussed/recommended COVID-vaccine/booster.

## 2020-12-20 NOTE — Patient Instructions (Signed)
Nice to see you.  Continue to take your medication daily.  Refills have been sent to the pharmacy.  Plan for follow up in 2 months or sooner if needed with lab work 1-2 weeks prior to appointment.   Have a great day and stay safe!

## 2020-12-20 NOTE — Progress Notes (Signed)
Brief Narrative   Patient ID: Phillip Gill, male    DOB: 1975/11/01, 45 y.o.   MRN: [id number]  Phillip Gill is a 45 y/o AA male diagnosed with HIV disease in July 2012 with risk factor of MSM. Initial viral load of 99,000 and CD4 of 510 entering care at Stage 1. [id number] negative. Initial genotype with no significant resistance. Previous ART experience with Prezista, Truvada, and Biktarvy.   Subjective:    Chief Complaint  Patient presents with   Follow-up    4 month follow up , missed a month with meds around the first part of May and started back around 25th of May, pt is back on meds now, no pain      HPI:  Phillip Gill is a 45 y.o. male with HIV disease last seen on 08/29/2020 with well-controlled virus and good adherence and tolerance to his ART regimen of Genvoya.  Medication was changed to [facility] secondary to interactions with medications.  Viral load was undetectable with CD4 count of 1474.  Most recent blood work completed on 12/06/2020 with viral load of 515 and CD4 count of 1112.  Kidney function, liver function, electrolytes within normal ranges.  Urine negative for gonorrhea and chlamydia.  Here today for routine follow-up.  Phillip Gill missed approximately 1 month of medication secondary to change of insurance and restarted taking his medication about 1 week prior to his most recent blood work on 12/06/2020.  Overall feeling well today with no new concerns/complaints.  Has continued interest in injectable medications. Denies fevers, chills, night sweats, headaches, changes in vision, neck pain/stiffness, nausea, diarrhea, vomiting, lesions or rashes.  Phillip Gill has no problems obtaining medication from the pharmacy now that his insurance issue has been resolved.  Denies feelings of being down, depressed, or hopeless recently.  Drinks alcohol rarely with no recreational illicit drug use and approximately 1 pack of cigarettes per day.  Condoms declined.  Due for  routine dental care.  COVID vaccinated and due for booster.   Allergies  Allergen Reactions   Bactrim [Sulfamethoxazole W/Trimethoprim (Co-Trimoxazole)] Hives and Shortness Of Breath   Sulfa Antibiotics Hives and Shortness Of Breath   Cymbalta [Duloxetine Hcl] Diarrhea   Duloxetine Diarrhea   Other Hives and Swelling    Colgate toothpaste    Zoloft [Sertraline Hcl] Diarrhea   Neurontin [Gabapentin] Rash      Outpatient Medications Prior to Visit  Medication Sig Dispense Refill   amLODipine-benazepril (LOTREL) 10-20 MG capsule Take 1 capsule by mouth daily. 30 capsule 1   amLODipine-benazepril (LOTREL) 5-20 MG capsule Take 1 capsule by mouth daily. 30 capsule 0   tobramycin (TOBREX) 0.3 % ophthalmic solution Place 1 drop into the right eye every 6 (six) hours. 5 mL 0   bictegravir-emtricitabine-tenofovir AF (BIKTARVY) 50-200-25 MG TABS tablet Take 1 tablet by mouth daily. 30 tablet 3   bictegravir-emtricitabine-tenofovir AF (BIKTARVY) 50-200-25 MG TABS tablet Take 1 tablet by mouth daily. 30 tablet 1   sildenafil (VIAGRA) 50 MG tablet Take 1 tablet (50 mg total) by mouth daily as needed for erectile dysfunction. 10 tablet 0   No facility-administered medications prior to visit.     Past Medical History:  Diagnosis Date   Anxiety    Arthritis    "neck" (02/16/2015)   Chronic hepatitis B (HCC)    SECONDARY TO HIV   Chronic lower back pain    Depression    Fibromyalgia    Genital warts  HIV disease (HCC) 02/28/2015   HIV infection (HCC)    followed by Dr. Luciana Axe- sees him every 4 months   Hypertension    IBS (irritable bowel syndrome)    Migraine    "none in years" (02/16/2015   Overweight 07/20/2015   Renal insufficiency 12/29/2013     Past Surgical History:  Procedure Laterality Date   CO2 LASER APPLICATION N/A 02/11/2013   Procedure: CO2 LASER APPLICATION;  Surgeon: Romie Levee, MD;  Location: [facility];  Service: General;  Laterality: N/A;   HIGH  RESOLUTION ANOSCOPY N/A 02/11/2013   Procedure: HIGH RESOLUTION ANOSCOPY WITH BIOPSY, LASER ABLATION;  Surgeon: Romie Levee, MD;  Location: [facility];  Service: General;  Laterality: N/A;   WISDOM TOOTH EXTRACTION         Review of Systems  Constitutional:  Negative for appetite change, chills, fatigue, fever and unexpected weight change.  Eyes:  Negative for visual disturbance.  Respiratory:  Negative for cough, chest tightness, shortness of breath and wheezing.   Cardiovascular:  Negative for chest pain and leg swelling.  Gastrointestinal:  Negative for abdominal pain, constipation, diarrhea, nausea and vomiting.  Genitourinary:  Negative for dysuria, flank pain, frequency, genital sores, hematuria and urgency.  Skin:  Negative for rash.  Allergic/Immunologic: Negative for immunocompromised state.  Neurological:  Negative for dizziness and headaches.     Objective:    BP 134/90   Pulse 92   Temp 98.4 F (36.9 C)   Wt 192 lb (87.1 kg)   SpO2 98%   BMI 26.04 kg/m  Nursing note and vital signs reviewed.  Physical Exam Constitutional:      General: He is not in acute distress.    Appearance: He is well-developed.  Eyes:     Conjunctiva/sclera: Conjunctivae normal.  Cardiovascular:     Rate and Rhythm: Normal rate and regular rhythm.     Heart sounds: Normal heart sounds. No murmur heard.   No friction rub. No gallop.  Pulmonary:     Effort: Pulmonary effort is normal. No respiratory distress.     Breath sounds: Normal breath sounds. No wheezing or rales.  Chest:     Chest wall: No tenderness.  Abdominal:     General: Bowel sounds are normal.     Palpations: Abdomen is soft.     Tenderness: There is no abdominal tenderness.  Musculoskeletal:     Cervical back: Neck supple.  Lymphadenopathy:     Cervical: No cervical adenopathy.  Skin:    General: Skin is warm and dry.     Findings: No rash.  Neurological:     Mental Status: He is alert and  oriented to person, place, and time.  Psychiatric:        Behavior: Behavior normal.        Thought Content: Thought content normal.        Judgment: Judgment normal.     Depression screen [facility] 2/9 12/20/2020 08/29/2020 04/05/2020 12/24/2019 01/29/2018  Decreased Interest 0 0 0 0 0  Down, Depressed, Hopeless 0 0 0 0 0  PHQ - 2 Score 0 0 0 0 0  Altered sleeping - - - - -  Tired, decreased energy - - - - -  Change in appetite - - - - -  Feeling bad or failure about yourself  - - - - -  Trouble concentrating - - - - -  Moving slowly or fidgety/restless - - - - -  Suicidal thoughts - - - - -  PHQ-9 Score - - - - -  Difficult doing work/chores - - - - -  Some recent data might be hidden       Assessment & Plan:    Patient Active Problem List   Diagnosis Date Noted   Erectile dysfunction 08/29/2020   Healthcare maintenance 04/05/2020   Foot lesion 12/24/2019   Human immunodeficiency virus (HIV) disease (HCC) 08/25/2019   Gonorrhea 02/11/2019   Hepatitis B surface antigen positive 09/05/2018   Overweight 07/20/2015   Colitis 03/08/2015   HIV (human immunodeficiency virus infection) (HCC)    Tobacco abuse    LGV (lymphogranuloma venereum)    Primary syphilis    Chronic hepatitis B (HCC) 10/03/2014   HTN (hypertension)    Gynecomastia 04/30/2014   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  No signs/symptoms of opportunistic infection or progressive HIV.  Discussed importance of notifying clinic if gaps of insurance or inability to get medication  occur.  We reviewed lab work and discussed plan of care.  Discussed long-term injectable medications and will hold off at this time. Continue current dose of Biktarvy.  Plan for follow-up in 2 months or sooner if needed.        Relevant Medications   bictegravir-emtricitabine-tenofovir AF (BIKTARVY) 50-200-25 MG TABS tablet   Healthcare maintenance    Discussed importance of safe sexual practice to reduce risk of STI.  Condoms declined. Routine dental care up-to-date per recommendations. Discussed/recommended COVID-vaccine/booster.       Erectile dysfunction    Stable with no adverse side effects with current dose of sildenafil.  Reminded of the adverse side effect of priapism exists to seek emergency care and to avoid taking with any nitroglycerin.  Continue current dose of sildenafil as needed.         I have changed Debbora Lacrosse. Lograsso's sildenafil. I am also having him maintain his amLODipine-benazepril, tobramycin, amLODipine-benazepril, and bictegravir-emtricitabine-tenofovir AF.   Meds ordered this encounter  Medications   sildenafil (VIAGRA) 100 MG tablet    Sig: Take 0.5-1 tablets (50-100 mg total) by mouth daily as needed for erectile dysfunction. Do not exceed 1 tablet per day.    Dispense:  30 tablet    Refill:  1    Order Specific Question:   Supervising Provider    Answer:   Drue Second, CYNTHIA [[id number]]   bictegravir-emtricitabine-tenofovir AF (BIKTARVY) 50-200-25 MG TABS tablet    Sig: Take 1 tablet by mouth daily.    Dispense:  30 tablet    Refill:  4    Order Specific Question:   Supervising Provider    Answer:   Judyann Munson [[id number]]     Follow-up: Return in about 2 months (around 02/19/2021), or if symptoms worsen or fail to improve.   Marcos Eke, MSN, FNP-C Nurse Practitioner [facility] Main number: [phone]

## 2020-12-20 NOTE — Assessment & Plan Note (Signed)
Stable with no adverse side effects with current dose of sildenafil.  Reminded of the adverse side effect of priapism exists to seek emergency care and to avoid taking with any nitroglycerin.  Continue current dose of sildenafil as needed.

## 2020-12-20 NOTE — Assessment & Plan Note (Signed)
[name] missed approximately 1 month of medication secondary to insurance issues which have since been resolved and now taking his medication regularly.  No signs/symptoms of opportunistic infection or progressive HIV.  Discussed importance of notifying clinic if gaps of insurance or inability to get medication occur.  We reviewed lab work and discussed plan of care.  Discussed long-term injectable medications and will hold off at this time. Continue current dose of Biktarvy.  Plan for follow-up in 2 months or sooner if needed. 

## 2020-12-20 NOTE — Assessment & Plan Note (Deleted)
Phillip Gill missed approximately 1 month of medication secondary to insurance issues which have since been resolved and now taking his medication regularly.  No signs/symptoms of opportunistic infection or progressive HIV.  Discussed importance of notifying clinic if gaps of insurance or inability to get medication occur.  We reviewed lab work and discussed plan of care.  Discussed long-term injectable medications and will hold off at this time. Continue current dose of Biktarvy.  Plan for follow-up in 2 months or sooner if needed.

## 2020-12-21 ENCOUNTER — Other Ambulatory Visit (HOSPITAL_COMMUNITY): Payer: Self-pay

## 2020-12-22 ENCOUNTER — Other Ambulatory Visit (HOSPITAL_COMMUNITY): Payer: Self-pay

## 2020-12-26 ENCOUNTER — Other Ambulatory Visit (HOSPITAL_COMMUNITY): Payer: Self-pay

## 2020-12-28 ENCOUNTER — Other Ambulatory Visit (HOSPITAL_COMMUNITY): Payer: Self-pay

## 2020-12-29 ENCOUNTER — Other Ambulatory Visit (HOSPITAL_COMMUNITY): Payer: Self-pay

## 2020-12-30 ENCOUNTER — Other Ambulatory Visit: Payer: Self-pay

## 2020-12-30 ENCOUNTER — Other Ambulatory Visit (HOSPITAL_COMMUNITY): Payer: Self-pay

## 2020-12-30 DIAGNOSIS — I10 Essential (primary) hypertension: Secondary | ICD-10-CM

## 2020-12-30 MED ORDER — AMLODIPINE BESY-BENAZEPRIL HCL 5-20 MG PO CAPS
1.0000 | ORAL_CAPSULE | Freq: Every day | ORAL | 1 refills | Status: DC
  Filled 2020-12-30 – 2021-01-18 (×2): qty 30, 30d supply, fill #0

## 2021-01-10 ENCOUNTER — Other Ambulatory Visit (HOSPITAL_COMMUNITY): Payer: Self-pay

## 2021-01-18 ENCOUNTER — Other Ambulatory Visit (HOSPITAL_COMMUNITY): Payer: Self-pay

## 2021-01-19 ENCOUNTER — Other Ambulatory Visit: Payer: Self-pay

## 2021-01-19 DIAGNOSIS — Z79899 Other long term (current) drug therapy: Secondary | ICD-10-CM

## 2021-01-19 DIAGNOSIS — B20 Human immunodeficiency virus [HIV] disease: Secondary | ICD-10-CM

## 2021-01-19 DIAGNOSIS — Z113 Encounter for screening for infections with a predominantly sexual mode of transmission: Secondary | ICD-10-CM

## 2021-01-24 ENCOUNTER — Other Ambulatory Visit (HOSPITAL_COMMUNITY)
Admission: RE | Admit: 2021-01-24 | Discharge: 2021-01-24 | Disposition: A | Discharge status: Home / Self Care | Payer: 59 | Source: Ambulatory Visit | Attending: Family | Admitting: Family

## 2021-01-24 ENCOUNTER — Other Ambulatory Visit: Payer: 59

## 2021-01-24 ENCOUNTER — Other Ambulatory Visit: Payer: Self-pay

## 2021-01-24 DIAGNOSIS — Z79899 Other long term (current) drug therapy: Secondary | ICD-10-CM

## 2021-01-24 DIAGNOSIS — Z113 Encounter for screening for infections with a predominantly sexual mode of transmission: Secondary | ICD-10-CM

## 2021-01-24 DIAGNOSIS — B20 Human immunodeficiency virus [HIV] disease: Secondary | ICD-10-CM

## 2021-01-25 LAB — URINE CYTOLOGY ANCILLARY ONLY
Chlamydia: NEGATIVE
Comment: NEGATIVE
Comment: NORMAL
Neisseria Gonorrhea: NEGATIVE

## 2021-01-25 LAB — T-HELPER CELL (CD4) - (RCID CLINIC ONLY)
CD4 % Helper T Cell: 34 % (ref 33–65)
CD4 T Cell Abs: 1380 /uL (ref 400–1790)

## 2021-01-28 LAB — COMPREHENSIVE METABOLIC PANEL
AG Ratio: 1.2 (calc) (ref 1.0–2.5)
ALT: 14 U/L (ref 9–46)
AST: 19 U/L (ref 10–40)
Albumin: 4.3 g/dL (ref 3.6–5.1)
Alkaline phosphatase (APISO): 63 U/L (ref 36–130)
BUN: 12 mg/dL (ref 7–25)
CO2: 30 mmol/L (ref 20–32)
Calcium: 9.3 mg/dL (ref 8.6–10.3)
Chloride: 104 mmol/L (ref 98–110)
Creat: 1.21 mg/dL (ref 0.60–1.29)
Globulin: 3.5 g/dL (calc) (ref 1.9–3.7)
Glucose, Bld: 124 mg/dL — ABNORMAL HIGH (ref 65–99)
Potassium: 4.3 mmol/L (ref 3.5–5.3)
Sodium: 139 mmol/L (ref 135–146)
Total Bilirubin: 0.2 mg/dL (ref 0.2–1.2)
Total Protein: 7.8 g/dL (ref 6.1–8.1)

## 2021-01-28 LAB — CBC WITH DIFFERENTIAL/PLATELET
Absolute Monocytes: 459 cells/uL (ref 200–950)
Basophils Absolute: 41 cells/uL (ref 0–200)
Basophils Relative: 0.4 %
Eosinophils Absolute: 102 cells/uL (ref 15–500)
Eosinophils Relative: 1 %
HCT: 43.7 % (ref 38.5–50.0)
Hemoglobin: 14.2 g/dL (ref 13.2–17.1)
Lymphs Abs: 4131 cells/uL — ABNORMAL HIGH (ref 850–3900)
MCH: 29 pg (ref 27.0–33.0)
MCHC: 32.5 g/dL (ref 32.0–36.0)
MCV: 89.2 fL (ref 80.0–100.0)
MPV: 10.8 fL (ref 7.5–12.5)
Monocytes Relative: 4.5 %
Neutro Abs: 5467 cells/uL (ref 1500–7800)
Neutrophils Relative %: 53.6 %
Platelets: 229 10*3/uL (ref 140–400)
RBC: 4.9 10*6/uL (ref 4.20–5.80)
RDW: 14.1 % (ref 11.0–15.0)
Total Lymphocyte: 40.5 %
WBC: 10.2 10*3/uL (ref 3.8–10.8)

## 2021-01-28 LAB — LIPID PANEL
Cholesterol: 160 mg/dL (ref <200)
HDL: 37 mg/dL — ABNORMAL LOW (ref >=40)
LDL Cholesterol (Calc): 95 mg/dL (calc)
Non-HDL Cholesterol (Calc): 123 mg/dL (calc) (ref <130)
Total CHOL/HDL Ratio: 4.3 (calc) (ref <5.0)
Triglycerides: 179 mg/dL — ABNORMAL HIGH (ref <150)

## 2021-01-28 LAB — HIV-1 RNA QUANT-NO REFLEX-BLD
HIV 1 RNA Quant: 97 Copies/mL — ABNORMAL HIGH
HIV-1 RNA Quant, Log: 1.99 Log cps/mL — ABNORMAL HIGH

## 2021-01-28 LAB — RPR: RPR Ser Ql: NONREACTIVE

## 2021-02-07 ENCOUNTER — Encounter: Payer: Self-pay | Admitting: Family

## 2021-02-07 ENCOUNTER — Other Ambulatory Visit: Payer: Self-pay

## 2021-02-07 ENCOUNTER — Ambulatory Visit (INDEPENDENT_AMBULATORY_CARE_PROVIDER_SITE_OTHER): Payer: 59 | Admitting: Family

## 2021-02-07 ENCOUNTER — Other Ambulatory Visit (HOSPITAL_COMMUNITY): Payer: Self-pay

## 2021-02-07 VITALS — BP 134/87 | HR 83 | Wt 202.0 lb

## 2021-02-07 DIAGNOSIS — M542 Cervicalgia: Secondary | ICD-10-CM | POA: Diagnosis not present

## 2021-02-07 DIAGNOSIS — I10 Essential (primary) hypertension: Secondary | ICD-10-CM

## 2021-02-07 DIAGNOSIS — B20 Human immunodeficiency virus [HIV] disease: Secondary | ICD-10-CM

## 2021-02-07 DIAGNOSIS — Z113 Encounter for screening for infections with a predominantly sexual mode of transmission: Secondary | ICD-10-CM | POA: Diagnosis not present

## 2021-02-07 DIAGNOSIS — A630 Anogenital (venereal) warts: Secondary | ICD-10-CM

## 2021-02-07 DIAGNOSIS — Z0000 Encounter for general adult medical examination without abnormal findings: Secondary | ICD-10-CM

## 2021-02-07 DIAGNOSIS — B181 Chronic viral hepatitis B without delta-agent: Secondary | ICD-10-CM

## 2021-02-07 MED ORDER — BICTEGRAVIR-EMTRICITAB-TENOFOV 50-200-25 MG PO TABS
1.0000 | ORAL_TABLET | Freq: Every day | ORAL | 4 refills | Status: DC
  Filled 2021-02-07 – 2021-02-24 (×2): qty 30, 30d supply, fill #0
  Filled 2021-04-06: qty 30, 30d supply, fill #1
  Filled 2021-05-09: qty 30, 30d supply, fill #2
  Filled 2021-06-07: qty 30, 30d supply, fill #3

## 2021-02-07 MED ORDER — AMLODIPINE BESY-BENAZEPRIL HCL 5-20 MG PO CAPS
1.0000 | ORAL_CAPSULE | Freq: Every day | ORAL | 3 refills | Status: DC
  Filled 2021-02-07 – 2021-02-24 (×2): qty 30, 30d supply, fill #0
  Filled 2021-04-06: qty 30, 30d supply, fill #1
  Filled 2021-05-09: qty 30, 30d supply, fill #2

## 2021-02-07 NOTE — Assessment & Plan Note (Signed)
Phillip Gill has improved adherence to his medication evidenced by improved viral load. No signs/symptoms of opportunistic infection or progressive HIV. Reviewed lab work and discussed plan of care with emphasis on need to continue to take medication daily and avoid gaps in care which can increase risk for disease progression or complications in the future. Discussed resources to help with credit to assist with housing. Plan for follow up in 2 months or sooner if needed with lab work 1-2 weeks prior to appointment.

## 2021-02-07 NOTE — Assessment & Plan Note (Signed)
Blood pressure is adequately controlled with current medication regimen and no adverse side effects. Encouraged to monitor blood pressure periodically. Continue current dose of amlodipine-benazepril.

## 2021-02-07 NOTE — Progress Notes (Signed)
Brief Narrative   Patient ID: BLAKELY GLUTH, male    DOB: 01-Jan-1976, 45 y.o.   MRN: [id number]  Mr. Fiumara is a 45 y/o AA male diagnosed with HIV disease in July 2012 with risk factor of MSM. Initial viral load of 99,000 and CD4 of 510 entering care at Stage 1. [id number] negative. Initial genotype with no significant resistance. Previous ART experience with Prezista, Truvada, and Biktarvy.   Subjective:    Chief Complaint  Patient presents with   Follow-up    Given condoms; no complaints;    HPI:  WILLIM TURNAGE is a 45 y.o. male with HIV disease last seen on 12/20/2020 having missed approximately 1 month of medication secondary to insurance issues.  Viral load was 515 with CD4 count of 1112.  Most recent lab work completed on 01/24/21 with viral load of 97 and CD4 count of 1380. STI testing was negative for gonorrhea and chlamydia.  Here today for routine follow-up.  Mr. Jabs missed about 10 doses of medication since his last office visit. He is continuing to work full time however is having housing issues. Has been having neck pain located in the cervical region that has been going on for a couple of years. Recently bought a massage device to help with his symptoms. No radiculopathy. Worsened with movement and improved with rest. No previous history of trauma or injury. Denies fevers, chills, night sweats, headaches, changes in vision, nausea, diarrhea, vomiting, lesions or rashes.  Mr. Spatafore remains covered by [facility] and has no problems obtaining medication. Denies feelings of being down, depressed or hopeless recently. Drinks alcohol rarely with no current recreational or illicit drug use. Immunizations are currently up to date. Condoms offered.    Allergies  Allergen Reactions   Bactrim [Sulfamethoxazole W/Trimethoprim (Co-Trimoxazole)] Hives and Shortness Of Breath   Sulfa Antibiotics Hives and Shortness Of Breath   Cymbalta [Duloxetine Hcl]  Diarrhea   Duloxetine Diarrhea   Other Hives and Swelling    [employer]gate toothpaste    Zoloft [Sertraline Hcl] Diarrhea   Neurontin [Gabapentin] Rash      Outpatient Medications Prior to Visit  Medication Sig Dispense Refill   sildenafil (VIAGRA) 100 MG tablet Take 0.5-1 tablets (50-100 mg total) by mouth daily as needed for erectile dysfunction. Do not exceed 1 tablet per day. 30 tablet 1   amLODipine-benazepril (LOTREL) 5-20 MG capsule Take 1 capsule by mouth daily. 30 capsule 1   bictegravir-emtricitabine-tenofovir AF (BIKTARVY) 50-200-25 MG TABS tablet Take 1 tablet by mouth daily. 30 tablet 4   tobramycin (TOBREX) 0.3 % ophthalmic solution Place 1 drop into the right eye every 6 (six) hours. (Patient not taking: Reported on 02/07/2021) 5 mL 0   amLODipine-benazepril (LOTREL) 10-20 MG capsule Take 1 capsule by mouth daily. (Patient not taking: Reported on 02/07/2021) 30 capsule 1   No facility-administered medications prior to visit.     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 Luciana Axe- sees him every 4 months   Hypertension    IBS (irritable bowel syndrome)    Migraine    "none in years" (02/16/2015   Overweight  07/20/2015   Renal insufficiency 12/29/2013     Past Surgical History:  Procedure Laterality Date   CO2 LASER APPLICATION N/A 02/11/2013   Procedure: CO2 LASER APPLICATION;  Surgeon: Romie Levee, MD;  Location: [facility];  Service: General;  Laterality: N/A;   HIGH RESOLUTION ANOSCOPY N/A 02/11/2013   Procedure: HIGH RESOLUTION ANOSCOPY WITH BIOPSY, LASER ABLATION;  Surgeon: Romie Levee, MD;  Location: [facility];  Service: General;  Laterality: N/A;   WISDOM TOOTH EXTRACTION        Review of  Systems  Constitutional:  Negative for appetite change, chills, fatigue, fever and unexpected weight change.  Eyes:  Negative for visual disturbance.  Respiratory:  Negative for cough, chest tightness, shortness of breath and wheezing.   Cardiovascular:  Negative for chest pain and leg swelling.  Gastrointestinal:  Negative for abdominal pain, constipation, diarrhea, nausea and vomiting.  Genitourinary:  Negative for dysuria, flank pain, frequency, genital sores, hematuria and urgency.  Skin:  Negative for rash.  Allergic/Immunologic: Negative for immunocompromised state.  Neurological:  Negative for dizziness and headaches.     Objective:    BP 134/87   Pulse 83   Wt 202 lb (91.6 kg)   BMI 27.40 kg/m  Nursing note and vital signs reviewed.  Physical Exam Constitutional:      General: He is not in acute distress.    Appearance: He is well-developed.  Eyes:     Conjunctiva/sclera: Conjunctivae normal.  Cardiovascular:     Rate and Rhythm: Normal rate and regular rhythm.     Heart sounds: Normal heart sounds. No murmur heard.   No friction rub. No gallop.  Pulmonary:     Effort: Pulmonary effort is normal. No respiratory distress.     Breath sounds: Normal breath sounds. No wheezing or rales.  Chest:     Chest wall: No tenderness.  Abdominal:     General: Bowel sounds are normal.     Palpations: Abdomen is soft.     Tenderness: There is no abdominal tenderness.  Musculoskeletal:     Cervical back: Neck supple.  Lymphadenopathy:     Cervical: No cervical adenopathy.  Skin:    General: Skin is warm and dry.     Findings: No rash.  Neurological:     Mental Status: He is alert and oriented to person, place, and time.  Psychiatric:        Behavior: Behavior normal.        Thought Content: Thought content normal.        Judgment: Judgment normal.     Depression screen [facility] 2/9 02/07/2021 12/20/2020 08/29/2020 04/05/2020 12/24/2019  Decreased Interest 0 0 0 0 0  Down,  Depressed, Hopeless 0 0 0 0 0  PHQ - 2 Score 0 0 0 0 0  Altered sleeping - - - - -  Tired, decreased energy - - - - -  Change in appetite - - - - -  Feeling bad or failure about yourself  - - - - -  Trouble concentrating - - - - -  Moving slowly or fidgety/restless - - - - -  Suicidal thoughts - - - - -  PHQ-9 Score - - - - -  Difficult doing work/chores - - - - -  Some recent data might be hidden       Assessment & Plan:    Patient Active Problem List   Diagnosis Date Noted   Cervical pain 02/07/2021  Erectile dysfunction 08/29/2020   Healthcare maintenance 04/05/2020   Foot lesion 12/24/2019   Human immunodeficiency virus (HIV) disease (HCC) 08/25/2019   Gonorrhea 02/11/2019   Hepatitis B surface antigen positive 09/05/2018   Overweight 07/20/2015   Colitis 03/08/2015   HIV (human immunodeficiency virus infection) (HCC)    Tobacco abuse    LGV (lymphogranuloma venereum)    Primary syphilis    Chronic hepatitis B (HCC) 10/03/2014   HTN (hypertension)    Gynecomastia 04/30/2014   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 Encouraged to monitor blood pressure periodically. Continue current dose of amlodipine-benazepril.        Relevant Medications   amLODipine-benazepril (LOTREL) 5-20 MG capsule     Digestive   Anal condyloma (Chronic)   Relevant Medications   bictegravir-emtricitabine-tenofovir AF (BIKTARVY) 50-200-25 MG TABS tablet   Other Relevant Orders   Ambulatory referral to Infectious Disease   Chronic hepatitis B (HCC) (Chronic)    No symptoms of flare and appears to be well controlled with  tenofovir in [place]. Check lab work and obtain ultrasound for routine screening. Continue current dose of Biktarvy.        Relevant Medications   bictegravir-emtricitabine-tenofovir AF (BIKTARVY) 50-200-25 MG TABS tablet   Other Relevant Orders   Hepatitis B DNA, ultraquantitative, PCR   Hepatitis B surface antigen   Hepatitis B e antigen   Hepatitis B e antibody   US Abdomen Limited RUQ (LIVER/GB)     Other   Human immunodeficiency virus (HIV) disease (HCC) (Chronic)    Mr. Tanzi has improved adherence to his medication evidenced by improved viral load. No signs/symptoms of opportunistic infection or progressive HIV. Reviewed lab work and discussed plan of care with emphasis on need to continue to take medication daily and avoid gaps in care which can increase risk for disease progression or complications in the future. Discussed resources to help with credit to assist with housing. Plan for follow up in 2 months or sooner if needed with lab work 1-2 weeks prior to appointment.        Relevant Medications   bictegravir-emtricitabine-tenofovir AF (BIKTARVY) 50-200-25 MG TABS tablet   Other Relevant Orders   T-helper cell (CD4)- ([facility] clinic only)   HIV-1 RNA quant-no reflex-bld   Healthcare maintenance    Discussed importance of safe sexual practice to reduce risk of STI. Condoms offered.  Vaccines up to date per recommendations.        Cervical pain    Mr. Currie has chronic cervical pain with no radiculopathy likely muscular given description. Discussed recommend treatment with conservative measures including ice/moist heat combined with home exercise therapy. Will consider imaging and possibly physical therapy if symptoms worsen or do not improve.        Other Visit Diagnoses     Screening for venereal disease    -  Primary   Relevant Orders   RPR        I am having Jenaro M. Gergely maintain his tobramycin, sildenafil, bictegravir-emtricitabine-tenofovir AF,  and amLODipine-benazepril.   Meds ordered this encounter  Medications   bictegravir-emtricitabine-tenofovir AF (BIKTARVY) 50-200-25 MG TABS tablet  Sig: Take 1 tablet by mouth daily.    Dispense:  30 tablet    Refill:  4    Order Specific Question:   Supervising Provider    Answer:   Drue SecondSNIDER, CYNTHIA [[id number]]   amLODipine-benazepril (LOTREL) 5-20 MG capsule    Sig: Take 1 capsule by mouth daily.    Dispense:  30 capsule    Refill:  3    Order Specific Question:   Supervising Provider    Answer:   Judyann MunsonSNIDER, CYNTHIA [[id number]]     Follow-up: Return in about 2 months (around 04/09/2021), or if symptoms worsen or fail to improve.   Marcos EkeGreg Maxmilian Trostel, MSN, FNP-C Nurse Practitioner [facility] Main number: [phone]

## 2021-02-07 NOTE — Patient Instructions (Addendum)
Nice to see you.  We will check your lab work today.  Continue to take your medications daily as prescribed.  Refills have been sent to the pharmacy.   For your neck - moist heat x 20 minutes every 2 hours as needed with stretching exercise multiple times per day.  May also use icy/hot, tiger balm or other otc medication. May consider Voltaren gel as well.  Plan for follow up in 2 months or sooner if needed.   Have a great day and stay safe!  Cervical Strain and Sprain Rehab Ask your health care provider which exercises are safe for you. Do exercises exactly as told by your health care provider and adjust them as directed. It is normal to feel mild stretching, pulling, tightness, or discomfort as you do these exercises. Stop right away if you feel sudden pain or your pain gets worse. Do not begin these exercises until told by your health care provider. Stretching and range-of-motion exercises Cervical side bending  Using good posture, sit on a stable chair or stand up. Without moving your shoulders, slowly tilt your left / right ear to your shoulder until you feel a stretch in the opposite side neck muscles. You should be looking straight ahead. Hold for __________ seconds. Repeat with the other side of your neck. Repeat __________ times. Complete this exercise __________ times a day. Cervical rotation  Using good posture, sit on a stable chair or stand up. Slowly turn your head to the side as if you are looking over your left / right shoulder. Keep your eyes level with the ground. Stop when you feel a stretch along the side and the back of your neck. Hold for __________ seconds. Repeat this by turning to your other side. Repeat __________ times. Complete this exercise __________ times a day. Thoracic extension and pectoral stretch Roll a towel or a small blanket so it is about 4 inches (10 cm) in diameter. Lie down on your back on a firm surface. Put the towel lengthwise, under  your spine in the middle of your back. It should not be under your shoulder blades. The towel should line up with your spine from your middle back to your lower back. Put your hands behind your head and let your elbows fall out to your sides. Hold for __________ seconds. Repeat __________ times. Complete this exercise __________ times a day. Strengthening exercises Isometric upper cervical flexion Lie on your back with a thin pillow behind your head and a small rolled-up towel under your neck. Gently tuck your chin toward your chest and nod your head down to look toward your feet. Do not lift your head off the pillow. Hold for __________ seconds. Release the tension slowly. Relax your neck muscles completely before you repeat this exercise. Repeat __________ times. Complete this exercise __________ times a day. Isometric cervical extension  Stand about 6 inches (15 cm) away from a wall, with your back facing the wall. Place a soft object, about 6-8 inches (15-20 cm) in diameter, between the back of your head and the wall. A soft object could be a small pillow, a ball, or a folded towel. Gently tilt your head back and press into the soft object. Keep your jaw and forehead relaxed. Hold for __________ seconds. Release the tension slowly. Relax your neck muscles completely before you repeat this exercise. Repeat __________ times. Complete this exercise __________ times a day. Posture and body mechanics Body mechanics refers to the movements and positions of your  body while you do your daily activities. Posture is part of body mechanics. Good posture and healthy body mechanics can help to relieve stress in your body's tissues and joints. Good posture means that your spine is in its natural S-curve position (your spine is neutral), your shoulders are pulled back slightly, and your head is not tipped forward. The following are general guidelines for applying improved posture andbody mechanics to your  everyday activities. Sitting  When sitting, keep your spine neutral and keep your feet flat on the floor. Use a footrest, if necessary, and keep your thighs parallel to the floor. Avoid rounding your shoulders, and avoid tilting your head forward. When working at a desk or a computer, keep your desk at a height where your hands are slightly lower than your elbows. Slide your chair under your desk so you are close enough to maintain good posture. When working at a computer, place your monitor at a height where you are looking straight ahead and you do not have to tilt your head forward or downward to look at the screen.  Standing  When standing, keep your spine neutral and keep your feet about hip-width apart. Keep a slight bend in your knees. Your ears, shoulders, and hips should line up. When you do a task in which you stand in one place for a long time, place one foot up on a stable object that is 2-4 inches (5-10 cm) high, such as a footstool. This helps keep your spine neutral.  Resting When lying down and resting, avoid positions that are most painful for you. Try to support your neck in a neutral position. You can use a contour pillow or asmall rolled-up towel. Your pillow should support your neck but not push on it. This information is not intended to replace advice given to you by your health care provider. Make sure you discuss any questions you have with your healthcare provider. Document Revised: 10/08/2018 Document Reviewed: 03/19/2018 Elsevier Patient Education  2022 [employer].

## 2021-02-07 NOTE — Assessment & Plan Note (Signed)
No symptoms of flare and appears to be well controlled with tenofovir in [place]. Check lab work and obtain ultrasound for routine screening. Continue current dose of Biktarvy.

## 2021-02-07 NOTE — Assessment & Plan Note (Signed)
Mr. Degen has chronic cervical pain with no radiculopathy likely muscular given description. Discussed recommend treatment with conservative measures including ice/moist heat combined with home exercise therapy. Will consider imaging and possibly physical therapy if symptoms worsen or do not improve.

## 2021-02-07 NOTE — Assessment & Plan Note (Signed)
   Discussed importance of safe sexual practice to reduce risk of STI. Condoms offered.   Vaccines up to date per recommendations.

## 2021-02-16 ENCOUNTER — Other Ambulatory Visit: Payer: 59

## 2021-02-24 ENCOUNTER — Other Ambulatory Visit: Payer: 59

## 2021-02-24 ENCOUNTER — Other Ambulatory Visit (HOSPITAL_COMMUNITY): Payer: Self-pay

## 2021-03-28 ENCOUNTER — Other Ambulatory Visit: Payer: 59

## 2021-03-30 ENCOUNTER — Other Ambulatory Visit: Payer: 59

## 2021-03-30 ENCOUNTER — Other Ambulatory Visit: Payer: Self-pay

## 2021-03-30 DIAGNOSIS — B20 Human immunodeficiency virus [HIV] disease: Secondary | ICD-10-CM

## 2021-03-30 DIAGNOSIS — B181 Chronic viral hepatitis B without delta-agent: Secondary | ICD-10-CM | POA: Diagnosis not present

## 2021-03-31 DIAGNOSIS — Z20822 Contact with and (suspected) exposure to covid-19: Secondary | ICD-10-CM | POA: Diagnosis not present

## 2021-03-31 DIAGNOSIS — A539 Syphilis, unspecified: Secondary | ICD-10-CM | POA: Diagnosis not present

## 2021-03-31 LAB — T-HELPER CELL (CD4) - (RCID CLINIC ONLY)
CD4 % Helper T Cell: 31 % — ABNORMAL LOW (ref 33–65)
CD4 T Cell Abs: 935 /uL (ref 400–1790)

## 2021-04-02 DIAGNOSIS — L03314 Cellulitis of groin: Secondary | ICD-10-CM | POA: Diagnosis not present

## 2021-04-02 LAB — HIV-1 RNA QUANT-NO REFLEX-BLD
HIV 1 RNA Quant: 109 Copies/mL — ABNORMAL HIGH
HIV-1 RNA Quant, Log: 2.04 Log cps/mL — ABNORMAL HIGH

## 2021-04-02 LAB — HEPATITIS B SURFACE ANTIGEN: Hepatitis B Surface Ag: REACTIVE — AB

## 2021-04-02 LAB — HEPATITIS B E ANTIGEN: Hep B E Ag: NONREACTIVE

## 2021-04-02 LAB — HEPATITIS B E ANTIBODY: Hep B E Ab: NONREACTIVE

## 2021-04-02 LAB — HEPATITIS B SURFACE ANTIBODY,QUALITATIVE: Hep B S Ab: NONREACTIVE

## 2021-04-05 ENCOUNTER — Telehealth: Payer: Self-pay

## 2021-04-05 NOTE — Telephone Encounter (Signed)
Received call from Peggy at [facility]. She received a referral for positive monkeypox test for patient. He was tested at [facility] on 04/02/21.   Called patient to offer follow up appointment, no answer. Left HIPAA compliant voicemail requesting callback.   Sandie Ano, RN

## 2021-04-05 NOTE — Telephone Encounter (Signed)
Spoke with patient, he saw his positive monkeypox results in MyChart. He accepts appointment with Arvilla Meres, PA 04/06/21 to discuss if treatment is indicated. Asked that he please cover any exposed rash when he comes in. Patient verbalized understanding and has no further questions.   Sandie Ano, RN

## 2021-04-06 ENCOUNTER — Encounter: Payer: Self-pay | Admitting: Physician Assistant

## 2021-04-06 ENCOUNTER — Ambulatory Visit (INDEPENDENT_AMBULATORY_CARE_PROVIDER_SITE_OTHER): Payer: 59 | Admitting: Physician Assistant

## 2021-04-06 ENCOUNTER — Other Ambulatory Visit: Payer: Self-pay

## 2021-04-06 ENCOUNTER — Other Ambulatory Visit (HOSPITAL_COMMUNITY): Payer: Self-pay

## 2021-04-06 VITALS — BP 130/92 | HR 90 | Temp 97.7°F | Wt 199.0 lb

## 2021-04-06 DIAGNOSIS — T50905A Adverse effect of unspecified drugs, medicaments and biological substances, initial encounter: Secondary | ICD-10-CM

## 2021-04-06 DIAGNOSIS — B04 Monkeypox: Secondary | ICD-10-CM | POA: Diagnosis not present

## 2021-04-06 DIAGNOSIS — Z21 Asymptomatic human immunodeficiency virus [HIV] infection status: Secondary | ICD-10-CM

## 2021-04-06 NOTE — Progress Notes (Signed)
Subjective:    Patient ID: Phillip Gill, male    DOB: 11/02/1975, 45 y.o.   MRN: [id number]  Chief Complaint  Patient presents with   Other    HMPX diagnosed by [facility] ED 04/02/21.  2 lesions.      HPI:  Phillip Gill is a 45 y.o. male with well controlled HIV-1 last seen in clinic 02/07/2021.  He is adherent to current ART regimen of BIK -CD4 935 03/30/21 and VL 01/24/21 was 97.  He reports today to follow up and assess treatment options for HMPX.  He was diagnosed by [facility] ED 04/02/2021. He has a total of 2 lesions along with LAD.  One lesion at base of penis and another along suprapubic region. Onset of lesions 03/24/21.  Went to [facility] ED twice 9/30 and 10/2.  He was treated empirically with Bicillin 2.4 million units and azithromycin 1000 mg once, clindamycine 150 mg qid for possible purulent cellulitis or staph. HSV, RPR, and VZV were negative as well as gonorrhea and chlamydia. He states the lesions are not painful or pruritic. One scab has fallen off and another is scabbing over.   He is MSM, has been sexually active after lesions presented and several times prior to lesion onset, at times engages in anonymous sex.  Mostly condomless with insertive anal intercourse and receives oral sex.   Living with brother, but has been isolating in his room.   Hx of STI-syphillis, LGV,gonorrhea  Does report rash developing on chest after starting clindamycin-was pruritic and appears red, and flat. Has improved since stopping clindamycin.         Allergies  Allergen Reactions   Bactrim [Sulfamethoxazole W/Trimethoprim (Co-Trimoxazole)] Hives and Shortness Of Breath   Sulfa Antibiotics Hives and Shortness Of Breath   Cymbalta [Duloxetine Hcl] Diarrhea   Duloxetine Diarrhea   Other Hives and Swelling    [employer] toothpaste    Zoloft [Sertraline Hcl] Diarrhea   Clindamycin/Lincomycin Rash   Neurontin [Gabapentin] Rash      Outpatient Medications Prior to Visit  Medication  Sig Dispense Refill   amLODipine-benazepril (LOTREL) 5-20 MG capsule Take 1 capsule by mouth daily. 30 capsule 3   bictegravir-emtricitabine-tenofovir AF (BIKTARVY) 50-200-25 MG TABS tablet Take 1 tablet by mouth daily. 30 tablet 4   sildenafil (VIAGRA) 100 MG tablet Take 0.5-1 tablets (50-100 mg total) by mouth daily as needed for erectile dysfunction. Do not exceed 1 tablet per day. 30 tablet 1   tobramycin (TOBREX) 0.3 % ophthalmic solution Place 1 drop into the right eye every 6 (six) hours. (Patient not taking: Reported on 02/07/2021) 5 mL 0   No facility-administered medications prior to visit.     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 Luciana Axe- sees him every 4 months   Hypertension    IBS (irritable bowel syndrome)    Migraine    "none in years" (02/16/2015   Overweight 07/20/2015   Renal insufficiency 12/29/2013     Past Surgical History:  Procedure Laterality Date   CO2 LASER APPLICATION N/A 02/11/2013   Procedure: CO2 LASER APPLICATION;  Surgeon: Romie Levee, MD;  Location: [facility]  [place];  Service: General;  Laterality: N/A;   HIGH RESOLUTION ANOSCOPY N/A 02/11/2013   Procedure: HIGH RESOLUTION ANOSCOPY WITH BIOPSY, LASER ABLATION;  Surgeon: Romie Levee, MD;  Location: [facility];  Service: General;  Laterality: N/A;   WISDOM TOOTH EXTRACTION         Review of Systems  Constitutional:  Negative for appetite change, chills, diaphoresis, fatigue, fever and unexpected weight change.  HENT:  Negative for sore throat.   Respiratory:  Negative for shortness of breath and wheezing.   Cardiovascular:  Negative for chest pain and palpitations.  Gastrointestinal:  Negative for abdominal pain, anal bleeding,  blood in stool, constipation, diarrhea, nausea, rectal pain and vomiting.  Genitourinary:  Negative for dysuria, hematuria, penile discharge, penile pain, penile swelling, scrotal swelling and testicular pain.  Musculoskeletal:  Negative for arthralgias, back pain, gait problem, joint swelling, myalgias, neck pain and neck stiffness.  Skin:  Positive for rash.  Allergic/Immunologic: Negative for immunocompromised state.  Neurological:  Negative for light-headedness and headaches.  Hematological:  Positive for adenopathy.  Psychiatric/Behavioral: Negative.       Objective:    BP (!) 130/92   Pulse 90   Temp 97.7 F (36.5 C) (Oral)   Wt 199 lb (90.3 kg)   BMI 26.99 kg/m  Nursing note and vital signs reviewed.  Physical Exam Vitals reviewed.  Constitutional:      General: He is not in acute distress.    Appearance: Normal appearance. He is obese. He is not ill-appearing, toxic-appearing or diaphoretic.  HENT:     Head: Normocephalic and atraumatic.  Eyes:     Extraocular Movements: Extraocular movements intact.     Conjunctiva/sclera: Conjunctivae normal.     Pupils: Pupils are equal, round, and reactive to light.  Cardiovascular:     Rate and Rhythm: Normal rate and regular rhythm.  Pulmonary:     Effort: Pulmonary effort is normal.     Breath sounds: Normal breath sounds.  Musculoskeletal:        General: Normal range of motion.  Skin:    General: Skin is warm.     Comments:  Suprapubic region- solitary lesion without crust/eschar, epithelialization in process, nttp, no warmth, margins erythematous, no pururlent d/c, or exudate.  Ventral aspect of penis solitary lesion with eschar-nttp, no induration  Chest-reticular erythematous maculopapular distributed across anterior torso, nttp, no exudate, no warmth, no induration, or d/c-suspect drug reaction to clindamycin  Neurological:     General: No focal deficit present.     Mental Status: He is alert and oriented to  person, place, and time.  Psychiatric:        Mood and Affect: Mood normal.        Behavior: Behavior normal.     Depression screen [facility] 2/9 02/07/2021 12/20/2020 08/29/2020 04/05/2020 12/24/2019  Decreased Interest 0 0 0 0 0  Down, Depressed, Hopeless 0 0 0 0 0  PHQ - 2 Score 0 0 0 0 0  Altered sleeping - - - - -  Tired, decreased energy - - - - -  Change in appetite - - - - -  Feeling bad or failure about yourself  - - - - -  Trouble concentrating - - - - -  Moving slowly or fidgety/restless - - - - -  Suicidal thoughts - - - - -  PHQ-9 Score - - - - -  Difficult doing work/chores - - - - -  Some recent data might  be hidden       Assessment & Plan:  HMPX-reviewed [facility] ED encounter 04/02/21 and 03/31/21, confirmed orthopoxvirus, mpx- all other STIs were negative and was treated empirically for syphilis chlamydia, and staph-all negative.  Advised to d/c clindamycin due to possible drug reaction on chest. Due to later stage of appearance of MPX lesions I do not feel it necessary to treat with TPOXX, I expect resolution of symptoms in the next week. He is scheduled to follow up with [name] next week. I wrote a note to return to work 04/17/21. He will remain isolated at home and discussed disinfecting surfaces and sterilizing linens.   HIV-1-adherent to current ART regimen, will follow [name] next week for HIV care   Patient Active Problem List   Diagnosis Date Noted   Human monkeypox 04/06/2021   Cervical pain 02/07/2021   Erectile dysfunction 08/29/2020   Healthcare maintenance 04/05/2020   Foot lesion 12/24/2019   Human immunodeficiency virus (HIV) disease (HCC) 08/25/2019   Gonorrhea 02/11/2019   Hepatitis B surface antigen positive 09/05/2018   Overweight 07/20/2015   Colitis 03/08/2015   HIV (human immunodeficiency virus infection) (HCC)    Tobacco abuse    LGV (lymphogranuloma venereum)    Primary syphilis    Chronic hepatitis B (HCC) 10/03/2014   HTN (hypertension)     Gynecomastia 04/30/2014   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 Hilton maintain his tobramycin, sildenafil, bictegravir-emtricitabine-tenofovir AF, and amLODipine-benazepril.   No orders of the defined types were placed in this encounter.    Follow-up: Return in about 2 weeks (around 04/20/2021) for mpx.

## 2021-04-06 NOTE — Patient Instructions (Addendum)
Continue Biktarvy as directed Stop antibiotic  Follow up with [name]    HUMAN MONKEYPOX ISOLATION RECOMMENDATIONS [email or web address]  A person with monkeypox can spread it to others from the time symptoms start until the rash has fully healed and a fresh layer of skin has formed. The illness typically lasts 2-4 weeks  Monkeypox can spread to anyone through close, personal, often skin-to-skin contact, including: Direct contact with monkeypox rash, scabs, or body fluids from a person with monkeypox. Touching objects, fabrics (clothing, bedding, or towels), and surfaces that have been used by someone with monkeypox. Contact with respiratory secretions. This direct contact can happen during intimate contact, including: Oral, anal, and vaginal sex or touching the genitals (penis, testicles, labia, and vagina) or anus (butthole) of a person with monkeypox. Hugging, massage, and kissing. Prolonged face-to-face contact. Touching fabrics and objects during sex that were used by a person with monkeypox and that have not been disinfected, such as bedding, towels, fetish gear, and sex toys. A pregnant person can spread the virus to their fetus through the placenta. It's also possible for people to get monkeypox from infected animals, either by being scratched or bitten by the animal or by preparing or eating meat or using products from an infected animal. Isolation typically lasts two to four weeks.   If you are unable to isolate from others:  While symptomatic with a fever or any respiratory symptoms, including sore throat, nasal congestion, or cough, remain isolated in the home and away from others unless it is necessary to see a healthcare provider or for an emergency. This includes avoiding close or physical contact with other people and animals. Cover the lesions, wear a well-fitting mask (more information below), and avoid public  transportation when leaving the home as required for medical care or an emergency. While a rash persists but in the absence of a fever or respiratory symptoms Cover all parts of the rash with clothing, gloves, and/or bandages. Wear a well-fitting mask to prevent the wearer from spreading oral and respiratory secretions when interacting with others until the rash and all other symptoms have resolved. Masks should fit closely on the face without any gaps along the edges or around the nose and be comfortable when worn properly over the nose and mouth.  Until all signs and symptoms of monkeypox illness have fully resolved Do not share items that have been worn or handled with other people or animals. Launder or disinfect items that have been worn or handled and surfaces that have been touched by a lesion. Avoid close physical contact, including sexual and/or close intimate contact, with other people. Avoid sharing utensils or cups. Items should be cleaned and disinfected before use by others. Avoid crowds and congregate settings. Wash hands often with soap and water or use an alcohol-based hand sanitizer, especially after direct contact with the rash.    Prevention in your Pets:  People with monkeypox should avoid contact with animals (specifically mammals), including pets. If possible, friends or family members should care for healthy animals until the owner has fully recovered. Keep any potentially infectious bandages, textiles (such as clothes, bedding) and other items away from pets, other domestic animals, and wildlife. In general, any mammal may become infected with monkeypox. It is not thought that other animals such as reptiles, fish or birds can be infected. If you notice an animal that had contact with an infected person appears sick (such as lethargy, lack of appetite, coughing, bloating, nasal or eye secretions or crust,  fever, rash) contact the Loss adjuster, chartered, Education officer, environmental, or state animal health official.

## 2021-04-10 ENCOUNTER — Other Ambulatory Visit (HOSPITAL_COMMUNITY): Payer: Self-pay

## 2021-04-13 ENCOUNTER — Ambulatory Visit (INDEPENDENT_AMBULATORY_CARE_PROVIDER_SITE_OTHER): Payer: 59 | Admitting: Family

## 2021-04-13 ENCOUNTER — Encounter: Payer: Self-pay | Admitting: Family

## 2021-04-13 ENCOUNTER — Other Ambulatory Visit: Payer: Self-pay

## 2021-04-13 VITALS — BP 131/88 | HR 101 | Temp 98.2°F | Wt 197.0 lb

## 2021-04-13 DIAGNOSIS — B20 Human immunodeficiency virus [HIV] disease: Secondary | ICD-10-CM | POA: Diagnosis not present

## 2021-04-13 DIAGNOSIS — N521 Erectile dysfunction due to diseases classified elsewhere: Secondary | ICD-10-CM

## 2021-04-13 DIAGNOSIS — B04 Monkeypox: Secondary | ICD-10-CM | POA: Diagnosis not present

## 2021-04-13 DIAGNOSIS — B181 Chronic viral hepatitis B without delta-agent: Secondary | ICD-10-CM

## 2021-04-13 MED ORDER — SILDENAFIL CITRATE 100 MG PO TABS: 50.0000 mg | ORAL_TABLET | Freq: Every day | ORAL | 1 refills | Status: DC | PRN

## 2021-04-13 NOTE — Assessment & Plan Note (Addendum)
Mr. Mells continues to have 2 remaining lesions on his mons pubis and penis. Continue with basic wound care. Will need remain out of work until lesions heal over. New note provided for return to work on 04/20/21 if needed.

## 2021-04-13 NOTE — Patient Instructions (Addendum)
Nice to see you.  Continue to take your medication daily.   Refills are available at the pharmacy.  May return to work once lesions heal over and new skin forms.   Keep lesions clean with soap and water.   Plan for follow up in 3 months or sooner if needed with lab work 1-2 weeks prior to appointment.   Have a great day!

## 2021-04-13 NOTE — Assessment & Plan Note (Signed)
Mr. Celona continues to have adequately controlled HIV disease with decent adherence and good tolerance. No signs/symptoms of opportunistic infection. Discussed importance of taking medications daily as prescribed. Continue current dose of Biktarvy. Plan for follow up in 3 months or sooner if needed.

## 2021-04-13 NOTE — Progress Notes (Signed)
Brief Narrative   Patient ID: Phillip Gill, male    DOB: 05/01/1976, 45 y.o.   MRN: [id number]  Phillip Gill is a 45 y/o AA male diagnosed with HIV disease in July 2012 with risk factor of MSM. Initial viral load of 99,000 and CD4 of 510 entering care at Stage 1. [id number] negative. Initial genotype with no significant resistance. Previous ART experience with Prezista, Truvada, and Biktarvy.   Subjective:    Chief Complaint  Patient presents with   Follow-up    HPI:  Phillip Gill is a 45 y.o. male with HIV disease last seen on 02/07/2021 with improved adherence and good tolerance to his ART regimen of Biktarvy.  Viral load was 97 with CD4 count of 1380.  Most recent lab work completed on 03/30/2021 with viral load of 109 and CD4 count of 935.  In the interim he has been diagnosed with Monkeypox. Treatment was deferred secondary to later stages of disease. Here today for follow up.   Phillip Gill has been taking his Biktarvy with good tolerance and has missed several doses but has improved adherence.  Monkey box lesions are continuing to heal slowly with 2 remaining 1 on the mons pubis and the second on the shaft of the penis. Denies fevers, chills, night sweats, headaches, changes in vision, neck pain/stiffness, nausea, diarrhea, vomiting, lesions or rashes.  Phillip Gill has no problems obtaining medication from the pharmacy.  Denies feelings of being down, depressed, or hopeless recently.  Phillip Gill continues to take his Biktarvy with several missed doses and good tolerance. Monkeypox lesions have continued to heal slowly.  No current recreational or illicit drug use with alcohol rarely and tobacco daily.  Condoms offered and provided.  Declines influenza vaccine.   Allergies  Allergen Reactions   Bactrim [Sulfamethoxazole W/Trimethoprim (Co-Trimoxazole)] Hives and Shortness Of Breath   Sulfa Antibiotics Hives and Shortness Of Breath   Cymbalta [Duloxetine Hcl] Diarrhea    Duloxetine Diarrhea   Other Hives and Swelling    [employer] toothpaste    Zoloft [Sertraline Hcl] Diarrhea   Clindamycin/Lincomycin Rash   Neurontin [Gabapentin] Rash      Outpatient Medications Prior to Visit  Medication Sig Dispense Refill   amLODipine-benazepril (LOTREL) 5-20 MG capsule Take 1 capsule by mouth daily. 30 capsule 3   bictegravir-emtricitabine-tenofovir AF (BIKTARVY) 50-200-25 MG TABS tablet Take 1 tablet by mouth daily. 30 tablet 4   sildenafil (VIAGRA) 100 MG tablet Take 0.5-1 tablets (50-100 mg total) by mouth daily as needed for erectile dysfunction. Do not exceed 1 tablet per day. 30 tablet 1   tobramycin (TOBREX) 0.3 % ophthalmic solution Place 1 drop into the right eye every 6 (six) hours. (Patient not taking: No sig reported) 5 mL 0   No facility-administered medications prior to visit.     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 Luciana Axe- sees him every 4 months   Hypertension    IBS (irritable bowel syndrome)    Migraine    "none in years" (02/16/2015   Overweight 07/20/2015   Renal insufficiency 12/29/2013     Past Surgical History:  Procedure Laterality  Date   CO2 LASER APPLICATION N/A 02/11/2013   Procedure: CO2 LASER APPLICATION;  Surgeon: Romie Levee, MD;  Location: [facility];  Service: General;  Laterality: N/A;   HIGH RESOLUTION ANOSCOPY N/A 02/11/2013   Procedure: HIGH RESOLUTION ANOSCOPY WITH BIOPSY, LASER ABLATION;  Surgeon: Romie Levee, MD;  Location: [facility];  Service: General;  Laterality: N/A;   WISDOM TOOTH EXTRACTION        Review of Systems  Constitutional:  Negative for appetite change, chills, fatigue, fever and unexpected weight change.  Eyes:  Negative  for visual disturbance.  Respiratory:  Negative for cough, chest tightness, shortness of breath and wheezing.   Cardiovascular:  Negative for chest pain and leg swelling.  Gastrointestinal:  Negative for abdominal pain, constipation, diarrhea, nausea and vomiting.  Genitourinary:  Negative for dysuria, flank pain, frequency, genital sores, hematuria and urgency.  Skin:  Negative for rash.  Allergic/Immunologic: Negative for immunocompromised state.  Neurological:  Negative for dizziness and headaches.     Objective:    BP 131/88   Pulse (!) 101   Temp 98.2 F (36.8 C) (Temporal)   Wt 197 lb (89.4 kg)   SpO2 96%   BMI 26.72 kg/m  Nursing note and vital signs reviewed.  Physical Exam Constitutional:      General: He is not in acute distress.    Appearance: He is well-developed.  Cardiovascular:     Rate and Rhythm: Normal rate and regular rhythm.     Heart sounds: Normal heart sounds.  Pulmonary:     Effort: Pulmonary effort is normal.     Breath sounds: Normal breath sounds.  Skin:    General: Skin is warm and dry.  Neurological:     Mental Status: He is alert and oriented to person, place, and time.  Psychiatric:        Behavior: Behavior normal.        Thought Content: Thought content normal.        Judgment: Judgment normal.     Depression screen [facility] 2 2/9 02/07/2021 12/20/2020 08/29/2020 04/05/2020 12/24/2019  Decreased Interest 0 0 0 0 0  Down, Depressed, Hopeless 0 0 0 0 0  PHQ - 2 Score 0 0 0 0 0  Altered sleeping - - - - -  Tired, decreased energy - - - - -  Change in appetite - - - - -  Feeling bad or failure about yourself  - - - - -  Trouble concentrating - - - - -  Moving slowly or fidgety/restless - - - - -  Suicidal thoughts - - - - -  PHQ-9 Score - - - - -  Difficult doing work/chores - - - - -  Some recent data might be hidden       Assessment & Plan:    Patient Active Problem List   Diagnosis Date Noted   Human monkeypox 04/06/2021   Cervical  pain 02/07/2021   Erectile dysfunction 08/29/2020   Healthcare maintenance 04/05/2020   Foot lesion 12/24/2019   Human immunodeficiency virus (HIV) disease (HCC) 08/25/2019   Gonorrhea 02/11/2019   Hepatitis B surface antigen positive 09/05/2018   Overweight 07/20/2015   Colitis 03/08/2015   HIV (human immunodeficiency virus infection) (HCC)    Tobacco abuse    LGV (lymphogranuloma venereum)    Primary syphilis    Chronic hepatitis B (HCC) 10/03/2014   HTN (hypertension)    Gynecomastia 04/30/2014   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 Consistent with either resolving virus or inactive Hepatitis B. Will check fibrotest/ultrasound at next office visit.         Other   Human immunodeficiency virus (HIV) disease (HCC) (Chronic)    Phillip Gill continues to have adequately controlled HIV disease with decent adherence and good tolerance. No signs/symptoms of opportunistic infection. Discussed importance of taking medications daily as prescribed. Continue current dose of Biktarvy. Plan for follow up in 3 months or sooner if needed.       Erectile dysfunction    Erectile dysfunction adequately controlled with current dose of sildenafil. Reviewed proper use and adverse side effects of medication.      Human monkeypox    Phillip Gill continues to have 2 remaining lesions on his mons pubis and penis. Continue with basic wound care. Will need remain out of work until lesions heal over. New note provided for return to work on 04/20/21 if needed.         I am having Phillip Gill maintain his tobramycin, bictegravir-emtricitabine-tenofovir  AF, amLODipine-benazepril, and sildenafil.   Meds ordered this encounter  Medications   sildenafil (VIAGRA) 100 MG tablet    Sig: Take 0.5-1 tablets (50-100 mg total) by mouth daily as needed for erectile dysfunction. Do not exceed 1 tablet per day.    Dispense:  30 tablet    Refill:  1    Order Specific Question:   Supervising Provider    Answer:   Judyann Munson [[id number]]     Follow-up: Return in about 3 months (around 07/14/2021), or if symptoms worsen or fail to improve.   Phillip Eke, MSN, FNP-C Nurse Practitioner [facility] Main number: [phone]

## 2021-04-13 NOTE — Assessment & Plan Note (Signed)
Erectile dysfunction adequately controlled with current dose of sildenafil. Reviewed proper use and adverse side effects of medication.

## 2021-04-13 NOTE — Assessment & Plan Note (Signed)
Hepatitis B Surface antigen positive with most recent lab work and DNA level undetectable. Consistent with either resolving virus or inactive Hepatitis B. Will check fibrotest/ultrasound at next office visit.

## 2021-04-20 ENCOUNTER — Other Ambulatory Visit: Payer: Self-pay

## 2021-04-20 ENCOUNTER — Ambulatory Visit (INDEPENDENT_AMBULATORY_CARE_PROVIDER_SITE_OTHER): Payer: 59 | Admitting: Internal Medicine

## 2021-04-20 ENCOUNTER — Encounter: Payer: Self-pay | Admitting: Internal Medicine

## 2021-04-20 DIAGNOSIS — B20 Human immunodeficiency virus [HIV] disease: Secondary | ICD-10-CM | POA: Diagnosis not present

## 2021-04-20 DIAGNOSIS — B04 Monkeypox: Secondary | ICD-10-CM | POA: Diagnosis not present

## 2021-04-20 NOTE — Progress Notes (Signed)
   Subjective:    Patient ID: Phillip Gill, male    DOB: 1976-04-29, 45 y.o.   MRN: [id number]  HPI Here for a work in visit Recently diagnosed with monkeypox and did not require treatment.  Had two remaining areas on his mons pubis when last seen 10/13.  Healing well, no issues.  Needs a return to work note.    Review of Systems  Constitutional:  Negative for fatigue.  Gastrointestinal:  Negative for diarrhea and nausea.      Objective:   Physical Exam Eyes:     General: No scleral icterus. Pulmonary:     Effort: Pulmonary effort is normal.  Neurological:     Mental Status: He is alert.          Assessment & Plan:

## 2021-04-20 NOTE — Assessment & Plan Note (Signed)
Lesions resolved.  Ok to return to work tomorrow, 10/21.  No restrictions.  Note provided.

## 2021-04-20 NOTE — Assessment & Plan Note (Signed)
He continues to be well controlled and no concerns with his medications.

## 2021-04-27 ENCOUNTER — Other Ambulatory Visit (HOSPITAL_COMMUNITY): Payer: Self-pay

## 2021-05-02 ENCOUNTER — Other Ambulatory Visit (HOSPITAL_COMMUNITY): Payer: Self-pay

## 2021-05-04 ENCOUNTER — Other Ambulatory Visit (HOSPITAL_COMMUNITY): Payer: Self-pay

## 2021-05-09 ENCOUNTER — Other Ambulatory Visit (HOSPITAL_COMMUNITY): Payer: Self-pay

## 2021-05-24 ENCOUNTER — Other Ambulatory Visit (HOSPITAL_COMMUNITY): Payer: Self-pay

## 2021-05-29 ENCOUNTER — Other Ambulatory Visit (HOSPITAL_COMMUNITY): Payer: Self-pay

## 2021-05-31 DIAGNOSIS — Z760 Encounter for issue of repeat prescription: Secondary | ICD-10-CM | POA: Diagnosis not present

## 2021-06-02 ENCOUNTER — Other Ambulatory Visit (HOSPITAL_COMMUNITY): Payer: Self-pay

## 2021-06-05 ENCOUNTER — Other Ambulatory Visit (HOSPITAL_COMMUNITY): Payer: Self-pay

## 2021-06-07 ENCOUNTER — Telehealth: Payer: Self-pay

## 2021-06-07 ENCOUNTER — Other Ambulatory Visit (HOSPITAL_COMMUNITY): Payer: Self-pay

## 2021-06-07 NOTE — Telephone Encounter (Signed)
Patient called, he no longer has insurance and thinks he will be getting insurance with a new job starting in January. RN offered him samples, but he says he can't come to the office due to work schedule.   Will route to pharmacy team.   Sandie Ano, RN

## 2021-06-08 ENCOUNTER — Other Ambulatory Visit (HOSPITAL_COMMUNITY): Payer: Self-pay

## 2021-06-30 ENCOUNTER — Other Ambulatory Visit (HOSPITAL_COMMUNITY): Payer: Self-pay

## 2021-07-05 ENCOUNTER — Other Ambulatory Visit (HOSPITAL_COMMUNITY): Payer: Self-pay

## 2021-07-06 ENCOUNTER — Other Ambulatory Visit: Payer: Self-pay

## 2021-07-06 DIAGNOSIS — Z113 Encounter for screening for infections with a predominantly sexual mode of transmission: Secondary | ICD-10-CM

## 2021-07-06 DIAGNOSIS — B20 Human immunodeficiency virus [HIV] disease: Secondary | ICD-10-CM

## 2021-07-07 ENCOUNTER — Other Ambulatory Visit (HOSPITAL_COMMUNITY): Payer: Self-pay

## 2021-07-10 ENCOUNTER — Ambulatory Visit (HOSPITAL_COMMUNITY)
Admission: EM | Admit: 2021-07-10 | Discharge: 2021-07-10 | Disposition: A | Discharge status: Home / Self Care | Payer: BC Managed Care – PPO | Attending: Physician Assistant | Admitting: Physician Assistant

## 2021-07-10 ENCOUNTER — Other Ambulatory Visit: Payer: Self-pay

## 2021-07-10 ENCOUNTER — Encounter (HOSPITAL_COMMUNITY): Payer: Self-pay

## 2021-07-10 DIAGNOSIS — I10 Essential (primary) hypertension: Secondary | ICD-10-CM

## 2021-07-10 DIAGNOSIS — J101 Influenza due to other identified influenza virus with other respiratory manifestations: Secondary | ICD-10-CM | POA: Diagnosis not present

## 2021-07-10 LAB — POC INFLUENZA A AND B ANTIGEN (URGENT CARE ONLY)
INFLUENZA A ANTIGEN, POC: POSITIVE — AB
INFLUENZA B ANTIGEN, POC: NEGATIVE

## 2021-07-10 MED ORDER — OSELTAMIVIR PHOSPHATE 75 MG PO CAPS: 75.0000 mg | ORAL_CAPSULE | Freq: Two times a day (BID) | ORAL | 0 refills | Status: DC

## 2021-07-10 MED ORDER — AMLODIPINE BESYLATE 10 MG PO TABS: 10.0000 mg | ORAL_TABLET | Freq: Every day | ORAL | 1 refills | Status: DC

## 2021-07-10 NOTE — ED Triage Notes (Addendum)
Pt c/o headaches, dizziness, and pressure behind eyes since yesterday. States has been out of his b/p meds for a month. S/o slight throat pain. Denies C/P or SOB. States exposed to COVID. States had a neg home covid test this am.

## 2021-07-10 NOTE — ED Provider Notes (Signed)
[place]    CSN: [id number] Arrival date & time: 07/10/21  [id number]      History   Chief Complaint Chief Complaint  Patient presents with   Headache    HPI Phillip Gill is a 46 y.o. male.   Patient presents today with a 2-day history of URI symptoms including nasal congestion, mild cough, sinus pressure, diarrhea, body aches, fatigue.  Denies any fever, chest pain, shortness of breath, nausea, vomiting.  He has not had his influenza vaccine but has had COVID-19 vaccination.  He has not had COVID in the past.  He has not been taking any over-the-counter medication for symptom management.  Does report exposure to COVID-19 at his place of employment; took a COVID-19 test at home that was negative earlier today.  He does have a history of HIV with detectable viral load.  He is taking antiviral medication as prescribed.  Denies any recent antibiotic use.  He denies any history of allergies, asthma, COPD.  He is a former smoker with quit date approximately 4 days ago.  Blood pressure is very elevated today.  He is been without his blood pressure medication for several months.  He denies any headache, chest pain, shortness of breath, vision changes.  He denies any decongestant use, increased caffeine or sodium consumption.  He is not currently have a PCP but does follow regularly with infectious disease.   Past Medical History:  Diagnosis Date   Anxiety    Arthritis    "neck" (02/16/2015)   Chronic hepatitis B ([place])    SECONDARY TO HIV   Chronic lower back pain    Depression    Fibromyalgia    Genital warts    HIV disease ([place]) 02/28/2015   HIV infection ([place])    followed by Dr. Linus Salmons- sees him every 4 months   Hypertension    IBS (irritable bowel syndrome)    Migraine    "none in years" (02/16/2015   Overweight 07/20/2015   Renal insufficiency 12/29/2013    Patient Active Problem List   Diagnosis Date Noted   Human monkeypox 04/06/2021   Cervical pain 02/07/2021    Erectile dysfunction 08/29/2020   Healthcare maintenance 04/05/2020   Foot lesion 12/24/2019   Human immunodeficiency virus (HIV) disease ([place]) 08/25/2019   Gonorrhea 02/11/2019   Hepatitis B surface antigen positive 09/05/2018   Overweight 07/20/2015   Colitis 03/08/2015   HIV (human immunodeficiency virus infection) ([place])    Tobacco abuse    LGV (lymphogranuloma venereum)    Primary syphilis    Chronic hepatitis B ([place]) 10/03/2014   HTN (hypertension)    Gynecomastia 04/30/2014   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   Chronic pain associated with significant psychosocial dysfunction 09/18/2012   Fibromyalgia muscle pain 02/11/2012   Depression 12/07/2011   Anxiety 02/02/2011    Past Surgical History:  Procedure Laterality Date   CO2 LASER APPLICATION N/A [id number]   Procedure: CO2 LASER APPLICATION;  Surgeon: Leighton Ruff, MD;  Location: [place];  Service: General;  Laterality: N/A;   HIGH RESOLUTION ANOSCOPY N/A 02/11/2013   Procedure: HIGH RESOLUTION ANOSCOPY WITH BIOPSY, LASER ABLATION;  Surgeon: Leighton Ruff, MD;  Location: [place];  Service: General;  Laterality: N/A;   [place] Medications    Prior to Admission medications   Medication Sig Start Date End Date Taking? Authorizing Provider  amLODipine (NORVASC) 10 MG tablet Take 1 tablet (10  mg total) by mouth daily. 07/10/21  Yes Shauna Bodkins, Derry Skill, PA-C  oseltamivir (TAMIFLU) 75 MG capsule Take 1 capsule (75 mg total) by mouth every 12 (twelve) hours. 07/10/21  Yes Rozell Theiler K, PA-C  bictegravir-emtricitabine-tenofovir AF (BIKTARVY) 50-200-25 MG TABS tablet Take 1 tablet by mouth daily. 02/07/21   Golden Circle, FNP  sildenafil (VIAGRA) 100 MG tablet Take 0.5-1 tablets (50-100 mg total) by mouth daily as needed for erectile dysfunction. Do not exceed 1 tablet per day. 04/13/21   Golden Circle, FNP  tobramycin (TOBREX) 0.3 % ophthalmic solution Place 1 drop into  the right eye every 6 (six) hours. Patient not taking: No sig reported 11/21/20   Vanessa Kick, MD    Family History Family History  Problem Relation Age of Onset   Hypertension Mother    Diabetes Mother    Stroke Mother        cerbral aneurysm   Hypertension Brother    Mental illness Neg Hx     Social History Social History   Tobacco Use   Smoking status: Every Day    Packs/day: 1.00    Years: 18.00    Pack years: 18.00    Types: Cigarettes   Smokeless tobacco: Never  Vaping Use   Vaping Use: Never used  Substance Use Topics   Alcohol use: Yes    Comment: "Once in a blue moon"   Drug use: No     Allergies   Bactrim [sulfamethoxazole w/trimethoprim (co-trimoxazole)], Sulfa antibiotics, Cymbalta [duloxetine hcl], Duloxetine, Other, Zoloft [sertraline hcl], Clindamycin/lincomycin, and Neurontin [gabapentin]   Review of Systems Review of Systems  Constitutional:  Positive for activity change and fatigue. Negative for appetite change and fever.  HENT:  Positive for congestion, sinus pressure and sore throat. Negative for sneezing.   Respiratory:  Positive for cough. Negative for shortness of breath.   Cardiovascular:  Negative for chest pain.  Gastrointestinal:  Negative for abdominal pain, diarrhea, nausea and vomiting.  Musculoskeletal:  Positive for arthralgias and myalgias.  Neurological:  Negative for dizziness, light-headedness and headaches.    Physical Exam Triage Vital Signs ED Triage Vitals  Enc Vitals Group     BP 07/10/21 1054 (!) 164/90     Pulse Rate 07/10/21 1054 83     Resp 07/10/21 1054 18     Temp 07/10/21 1054 98.5 F (36.9 C)     Temp Source 07/10/21 1054 Oral     SpO2 07/10/21 1054 97 %     Weight --      Height --      Head Circumference --      Peak Flow --      Pain Score 07/10/21 1055 4     Pain Loc --      Pain Edu? --      Excl. in [place]? --    No data found.  Updated Vital Signs BP (!) 164/90 (BP Location: Left Arm)    Pulse  83    Temp 98.5 F (36.9 C) (Oral)    Resp 18    SpO2 97%   Visual Acuity Right Eye Distance:   Left Eye Distance:   Bilateral Distance:    Right Eye Near:   Left Eye Near:    Bilateral Near:     Physical Exam Vitals reviewed.  Constitutional:      General: He is awake.     Appearance: Normal appearance. He is well-developed. He is not ill-appearing.  Comments: Very pleasant male appears stated age no acute distress sitting comfortably in exam room  HENT:     Head: Normocephalic and atraumatic.     Right Ear: Tympanic membrane, ear canal and external ear normal. Tympanic membrane is not erythematous or bulging.     Left Ear: Ear canal and external ear normal. Tympanic membrane is injected. Tympanic membrane is not erythematous or bulging.     Nose: Nose normal.     Mouth/Throat:     Pharynx: Uvula midline. Posterior oropharyngeal erythema present. No oropharyngeal exudate or uvula swelling.  Cardiovascular:     Rate and Rhythm: Normal rate and regular rhythm.     Heart sounds: Normal heart sounds, S1 normal and S2 normal. No murmur heard. Pulmonary:     Effort: Pulmonary effort is normal. No accessory muscle usage or respiratory distress.     Breath sounds: Normal breath sounds. No stridor. No wheezing, rhonchi or rales.     Comments: Clear to auscultation bilaterally Abdominal:     General: Bowel sounds are normal.     Palpations: Abdomen is soft.     Tenderness: There is no abdominal tenderness.  Neurological:     Mental Status: He is alert.  Psychiatric:        Behavior: Behavior is cooperative.     UC Treatments / Results  Labs (all labs ordered are listed, but only abnormal results are displayed) Labs Reviewed  POC INFLUENZA A AND B ANTIGEN (URGENT CARE ONLY) - Abnormal; Notable for the following components:      Result Value   INFLUENZA A ANTIGEN, POC POSITIVE (*)    All other components within normal limits    EKG   Radiology No results  found.  Procedures Procedures (including critical care time)  Medications Ordered in UC Medications - No data to display  Initial Impression / Assessment and Plan / UC Course  I have reviewed the triage vital signs and the nursing notes.  Pertinent labs & imaging results that were available during my care of the patient were reviewed by me and considered in my medical decision making (see chart for details).     Patient is a positive for influenza A.  He is within 72 hours of symptom onset so we will start Tamiflu.  Recommended he use over-the-counter medication including Tylenol, Mucinex, Flonase.  He is to rest and drink plenty of fluid.  He was provided work excuse note.  Discussed that if he has any worsening symptoms he needs to return for reevaluation.  Strict return precautions given to which he expressed understanding.  Blood pressure is very elevated today but patient denies any signs/symptoms of endorgan damage.  Patient has been without antihypertensive medication for several months.  New prescription for amlodipine 10 mg sent to pharmacy.  He was encouraged to avoid NSAIDs, caffeine, decongestants, sodium.  He is to monitor his blood pressure at home and keep log for evaluation of follow-up appointment.  Recommended follow-up with either our clinic or PCP within 1 week to ensure symptom improvement and normalization of blood pressure.  Discussed that if he develops any chest pain, shortness of breath, headache, vision changes, dizziness in the setting of high blood pressure he needs to go to the emergency room.  Final Clinical Impressions(s) / UC Diagnoses   Final diagnoses:  Influenza A  Elevated blood pressure reading with diagnosis of hypertension     Discharge Instructions      You tested positive for influenza A.  Please start Tamiflu twice daily to decrease the risk of complications related to the flu.  Use over-the-counter medication such as Tylenol, Mucinex, Flonase  for additional symptom relief.  Make sure you rest and drink plenty of fluid.  If you have any worsening symptoms including high fever not responding to medication, chest pain, shortness of breath, nausea/vomiting interfering with oral intake, weakness you need to go to the emergency room.  If symptoms do not improve by next week please return for reevaluation.  I have called in amlodipine 10 mg to help with your blood pressure.  Please avoid NSAIDs including aspirin/ibuprofen/naproxen, decongestants, caffeine, sodium.  Monitor blood pressure at home.  You need to follow-up with either our clinic or your primary care provider within a week for blood pressure recheck.  If you develop any chest pain, shortness of breath, headache, vision changes, dizziness in the setting of high blood pressure you need to go to the emergency room.     ED Prescriptions     Medication Sig Dispense Auth. Provider   amLODipine (NORVASC) 10 MG tablet Take 1 tablet (10 mg total) by mouth daily. 30 tablet Jaquarius Seder K, PA-C   oseltamivir (TAMIFLU) 75 MG capsule Take 1 capsule (75 mg total) by mouth every 12 (twelve) hours. 10 capsule Aubery Date, Derry Skill, PA-C      PDMP not reviewed this encounter.   Terrilee Croak, PA-C 07/10/21 1154

## 2021-07-10 NOTE — Discharge Instructions (Signed)
You tested positive for influenza A.  Please start Tamiflu twice daily to decrease the risk of complications related to the flu.  Use over-the-counter medication such as Tylenol, Mucinex, Flonase for additional symptom relief.  Make sure you rest and drink plenty of fluid.  If you have any worsening symptoms including high fever not responding to medication, chest pain, shortness of breath, nausea/vomiting interfering with oral intake, weakness you need to go to the emergency room.  If symptoms do not improve by next week please return for reevaluation.  I have called in amlodipine 10 mg to help with your blood pressure.  Please avoid NSAIDs including aspirin/ibuprofen/naproxen, decongestants, caffeine, sodium.  Monitor blood pressure at home.  You need to follow-up with either our clinic or your primary care provider within a week for blood pressure recheck.  If you develop any chest pain, shortness of breath, headache, vision changes, dizziness in the setting of high blood pressure you need to go to the emergency room.

## 2021-07-11 ENCOUNTER — Other Ambulatory Visit: Payer: Self-pay

## 2021-07-11 ENCOUNTER — Other Ambulatory Visit (HOSPITAL_COMMUNITY): Payer: Self-pay

## 2021-07-11 DIAGNOSIS — B20 Human immunodeficiency virus [HIV] disease: Secondary | ICD-10-CM

## 2021-07-11 MED ORDER — BICTEGRAVIR-EMTRICITAB-TENOFOV 50-200-25 MG PO TABS: 1.0000 | ORAL_TABLET | Freq: Every day | ORAL | 0 refills | Status: DC

## 2021-07-13 ENCOUNTER — Other Ambulatory Visit: Payer: 59

## 2021-07-16 ENCOUNTER — Encounter (HOSPITAL_BASED_OUTPATIENT_CLINIC_OR_DEPARTMENT_OTHER): Payer: Self-pay | Admitting: Emergency Medicine

## 2021-07-16 ENCOUNTER — Emergency Department (HOSPITAL_BASED_OUTPATIENT_CLINIC_OR_DEPARTMENT_OTHER)
Admission: EM | Admit: 2021-07-16 | Discharge: 2021-07-16 | Disposition: A | Discharge status: Home / Self Care | Payer: BC Managed Care – PPO | Attending: Emergency Medicine | Admitting: Emergency Medicine

## 2021-07-16 ENCOUNTER — Other Ambulatory Visit: Payer: Self-pay

## 2021-07-16 DIAGNOSIS — J101 Influenza due to other identified influenza virus with other respiratory manifestations: Secondary | ICD-10-CM | POA: Diagnosis not present

## 2021-07-16 DIAGNOSIS — R0981 Nasal congestion: Secondary | ICD-10-CM | POA: Insufficient documentation

## 2021-07-16 DIAGNOSIS — Z79899 Other long term (current) drug therapy: Secondary | ICD-10-CM | POA: Insufficient documentation

## 2021-07-16 DIAGNOSIS — R059 Cough, unspecified: Secondary | ICD-10-CM | POA: Diagnosis present

## 2021-07-16 DIAGNOSIS — J111 Influenza due to unidentified influenza virus with other respiratory manifestations: Secondary | ICD-10-CM

## 2021-07-16 NOTE — ED Triage Notes (Signed)
Pt states he went to urgent care on Monday and was diagnosed with the flu  Pt states he is not feeling any better  Pt states his symptoms include runny nose, pressure behind his eyes, productive cough,shortness of breath, and intermittent chest pain, fatigue.  Pt states they gave him medication but it has not helped

## 2021-07-16 NOTE — ED Provider Notes (Signed)
[facility] EMERGENCY DEPARTMENT Provider Note   CSN: [id number] Arrival date & time: 07/16/21  0546     History  Chief Complaint  Patient presents with   Influenza    Phillip Gill is a 46 y.o. male.  46 yo M with a chief complaints of having the flu.  Tells me that he has not been doing well.  Cough and congestion.  Feels like that is getting mildly worse.  Was seen at an urgent care visit and was started on Tamiflu.  Does not feel like it made anything any better.  Was written out for work until yesterday but feels not well enough to go back to work now.  Has been able to eat and drink without issue.  Denies fevers or chills.  Feels like to have trouble breathing off and on.  The history is provided by the patient.  Influenza Presenting symptoms: cough and shortness of breath   Presenting symptoms: no diarrhea, no fever, no headaches, no myalgias and no vomiting   Associated symptoms: nasal congestion   Associated symptoms: no chills   Illness Severity:  Moderate Onset quality:  Gradual Duration:  4 days Timing:  Constant Progression:  Unchanged Chronicity:  New Associated symptoms: chest pain, congestion, cough and shortness of breath   Associated symptoms: no abdominal pain, no diarrhea, no fever, no headaches, no myalgias, no rash and no vomiting       Home Medications Prior to Admission medications   Medication Sig Start Date End Date Taking? Authorizing Provider  amLODipine (NORVASC) 10 MG tablet Take 1 tablet (10 mg total) by mouth daily. 07/10/21   Raspet, Derry Skill, PA-C  bictegravir-emtricitabine-tenofovir AF (BIKTARVY) 50-200-25 MG TABS tablet Take 1 tablet by mouth daily. 07/11/21   Golden Circle, FNP  oseltamivir (TAMIFLU) 75 MG capsule Take 1 capsule (75 mg total) by mouth every 12 (twelve) hours. 07/10/21   Raspet, Derry Skill, PA-C  sildenafil (VIAGRA) 100 MG tablet Take 0.5-1 tablets (50-100 mg total) by mouth daily as needed for erectile  dysfunction. Do not exceed 1 tablet per day. 04/13/21   Golden Circle, FNP  tobramycin (TOBREX) 0.3 % ophthalmic solution Place 1 drop into the right eye every 6 (six) hours. Patient not taking: No sig reported 11/21/20   Vanessa Kick, MD      Allergies    Bactrim [sulfamethoxazole w/trimethoprim (co-trimoxazole)], Sulfa antibiotics, Cymbalta [duloxetine hcl], Duloxetine, Other, Zoloft [sertraline hcl], Clindamycin/lincomycin, and Neurontin [gabapentin]    Review of Systems   Review of Systems  Constitutional:  Negative for chills and fever.  HENT:  Positive for congestion. Negative for facial swelling.   Eyes:  Negative for discharge and visual disturbance.  Respiratory:  Positive for cough and shortness of breath.   Cardiovascular:  Positive for chest pain. Negative for palpitations.  Gastrointestinal:  Negative for abdominal pain, diarrhea and vomiting.  Musculoskeletal:  Negative for arthralgias and myalgias.  Skin:  Negative for color change and rash.  Neurological:  Negative for tremors, syncope and headaches.  Psychiatric/Behavioral:  Negative for confusion and dysphoric mood.    Physical Exam Updated Vital Signs BP (!) 156/115 (BP Location: Left Arm)    Pulse 92    Temp (!) 97.5 F (36.4 C) (Oral)    Resp 18    Ht 6' (1.829 m)    Wt 95.3 kg    SpO2 98%    BMI 28.48 kg/m  Physical Exam Vitals and nursing note reviewed.  Constitutional:  Appearance: He is well-developed.  HENT:     Head: Normocephalic and atraumatic.  Eyes:     Pupils: Pupils are equal, round, and reactive to light.  Neck:     Vascular: No JVD.  Cardiovascular:     Rate and Rhythm: Normal rate and regular rhythm.     Heart sounds: No murmur heard.   No friction rub. No gallop.  Pulmonary:     Effort: No respiratory distress.     Breath sounds: No wheezing.  Abdominal:     General: There is no distension.     Tenderness: There is no abdominal tenderness. There is no guarding or rebound.   Musculoskeletal:        General: Normal range of motion.     Cervical back: Normal range of motion and neck supple.  Skin:    Coloration: Skin is not pale.     Findings: No rash.  Neurological:     Mental Status: He is alert and oriented to person, place, and time.  Psychiatric:        Behavior: Behavior normal.    ED Results / Procedures / Treatments   Labs (all labs ordered are listed, but only abnormal results are displayed) Labs Reviewed - No data to display  EKG None  Radiology No results found.  Procedures Procedures    Medications Ordered in ED Medications - No data to display  ED Course/ Medical Decision Making/ A&P                           Medical Decision Making  46 yo M with a chief complaints of having the flu.  Had a positive test at an urgent care visit.  I reviewed the notes was started on Tamiflu.  He feels like things may be have gotten worse.  He is well-appearing nontoxic.  Clear lung sounds.  No bacterial source found on exam.  I discussed the typical course of influenza.  I feel his symptoms are likely typical.  Will discharge home.  PCP follow-up.  6:39 AM:  I have discussed the diagnosis/risks/treatment options with the patient and believe the pt to be eligible for discharge home to follow-up with PCP. We also discussed returning to the ED immediately if new or worsening sx occur. We discussed the sx which are most concerning (e.g., sudden worsening pain, fever, inability to tolerate by mouth) that necessitate immediate return. Medications administered to the patient during their visit and any new prescriptions provided to the patient are listed below.  Medications given during this visit Medications - No data to display   The patient appears reasonably screen and/or stabilized for discharge and I doubt any other medical condition or other [facility] requiring further screening, evaluation, or treatment in the ED at this time prior to discharge.           Final Clinical Impression(s) / ED Diagnoses Final diagnoses:  Influenza    Rx / DC Orders ED Discharge Orders     None         Deno Etienne, DO 07/16/21 [id number]

## 2021-07-16 NOTE — Discharge Instructions (Signed)
Take tylenol 2 pills 4 times a day and motrin 4 pills 3 times a day.  Drink plenty of fluids.  Return for worsening shortness of breath, headache, confusion. Follow up with your family doctor.   

## 2021-07-16 NOTE — ED Notes (Signed)
ED Provider at bedside. 

## 2021-07-19 ENCOUNTER — Other Ambulatory Visit: Payer: 59

## 2021-07-20 ENCOUNTER — Other Ambulatory Visit: Payer: Self-pay

## 2021-07-20 ENCOUNTER — Other Ambulatory Visit: Payer: BC Managed Care – PPO

## 2021-07-20 ENCOUNTER — Other Ambulatory Visit (HOSPITAL_COMMUNITY)
Admission: RE | Admit: 2021-07-20 | Discharge: 2021-07-20 | Disposition: A | Discharge status: Home / Self Care | Payer: BC Managed Care – PPO | Source: Ambulatory Visit | Attending: Family | Admitting: Family

## 2021-07-20 ENCOUNTER — Telehealth: Payer: Self-pay

## 2021-07-20 DIAGNOSIS — B20 Human immunodeficiency virus [HIV] disease: Secondary | ICD-10-CM

## 2021-07-20 DIAGNOSIS — Z113 Encounter for screening for infections with a predominantly sexual mode of transmission: Secondary | ICD-10-CM | POA: Insufficient documentation

## 2021-07-20 MED ORDER — BICTEGRAVIR-EMTRICITAB-TENOFOV 50-200-25 MG PO TABS: 1.0000 | ORAL_TABLET | Freq: Every day | ORAL | 0 refills | Status: DC

## 2021-07-20 NOTE — Telephone Encounter (Signed)
Attempted to call patient back regarding medication concern. Not able to reach him at this time. Did not leave VM. Refill sent into preferred pharmacy. Will need to confirm patient's pharmacy for additional refills. Leatrice Jewels, RMA

## 2021-07-20 NOTE — Telephone Encounter (Signed)
-----   Message from [place] sent at 07/20/2021 11:05 AM EST ----- Patient is out of his biktarvy, patient is [facility] ([address] [place], [place] [address]) phone number is; [phone]. Patient stated that he has been having a hard time getting his medication, he does have a new insurance card added to his chart.

## 2021-07-21 LAB — T-HELPER CELL (CD4) - (RCID CLINIC ONLY)
CD4 % Helper T Cell: 37 % (ref 33–65)
CD4 T Cell Abs: 1261 /uL (ref 400–1790)

## 2021-07-21 LAB — URINE CYTOLOGY ANCILLARY ONLY
Chlamydia: NEGATIVE
Comment: NEGATIVE
Comment: NORMAL
Neisseria Gonorrhea: NEGATIVE

## 2021-07-22 LAB — CBC WITH DIFFERENTIAL/PLATELET
Absolute Monocytes: 733 cells/uL (ref 200–950)
Basophils Absolute: 38 cells/uL (ref 0–200)
Basophils Relative: 0.4 %
Eosinophils Absolute: 28 cells/uL (ref 15–500)
Eosinophils Relative: 0.3 %
HCT: 42 % (ref 38.5–50.0)
Hemoglobin: 14.5 g/dL (ref 13.2–17.1)
Lymphs Abs: 3619 cells/uL (ref 850–3900)
MCH: 30.2 pg (ref 27.0–33.0)
MCHC: 34.5 g/dL (ref 32.0–36.0)
MCV: 87.5 fL (ref 80.0–100.0)
MPV: 10.3 fL (ref 7.5–12.5)
Monocytes Relative: 7.8 %
Neutro Abs: 4982 cells/uL (ref 1500–7800)
Neutrophils Relative %: 53 %
Platelets: 236 10*3/uL (ref 140–400)
RBC: 4.8 10*6/uL (ref 4.20–5.80)
RDW: 13.8 % (ref 11.0–15.0)
Total Lymphocyte: 38.5 %
WBC: 9.4 10*3/uL (ref 3.8–10.8)

## 2021-07-22 LAB — COMPLETE METABOLIC PANEL WITH GFR
AG Ratio: 1.4 (calc) (ref 1.0–2.5)
ALT: 15 U/L (ref 9–46)
AST: 22 U/L (ref 10–40)
Albumin: 4.2 g/dL (ref 3.6–5.1)
Alkaline phosphatase (APISO): 69 U/L (ref 36–130)
BUN: 14 mg/dL (ref 7–25)
CO2: 31 mmol/L (ref 20–32)
Calcium: 9.1 mg/dL (ref 8.6–10.3)
Chloride: 106 mmol/L (ref 98–110)
Creat: 1.13 mg/dL (ref 0.60–1.29)
Globulin: 3.1 g/dL (calc) (ref 1.9–3.7)
Glucose, Bld: 109 mg/dL — ABNORMAL HIGH (ref 65–99)
Potassium: 3.6 mmol/L (ref 3.5–5.3)
Sodium: 140 mmol/L (ref 135–146)
Total Bilirubin: 0.4 mg/dL (ref 0.2–1.2)
Total Protein: 7.3 g/dL (ref 6.1–8.1)
eGFR: 82 mL/min/{1.73_m2} (ref >=60)

## 2021-07-22 LAB — HIV-1 RNA QUANT-NO REFLEX-BLD
HIV 1 RNA Quant: 40 Copies/mL — ABNORMAL HIGH
HIV-1 RNA Quant, Log: 1.6 Log cps/mL — ABNORMAL HIGH

## 2021-07-22 LAB — RPR: RPR Ser Ql: NONREACTIVE

## 2021-07-27 ENCOUNTER — Encounter: Payer: 59 | Admitting: Family

## 2021-08-03 ENCOUNTER — Encounter: Payer: 59 | Admitting: Family

## 2021-08-04 ENCOUNTER — Telehealth: Payer: Self-pay

## 2021-08-04 ENCOUNTER — Other Ambulatory Visit: Payer: Self-pay | Admitting: Family

## 2021-08-04 DIAGNOSIS — B20 Human immunodeficiency virus [HIV] disease: Secondary | ICD-10-CM

## 2021-08-04 NOTE — Telephone Encounter (Signed)
Called patient to get missed appointment rescheduled, left patient a voicemail to call us back to get it rescheduled.

## 2021-08-09 ENCOUNTER — Telehealth: Payer: Self-pay

## 2021-08-09 ENCOUNTER — Encounter (HOSPITAL_BASED_OUTPATIENT_CLINIC_OR_DEPARTMENT_OTHER): Payer: Self-pay

## 2021-08-09 ENCOUNTER — Other Ambulatory Visit: Payer: Self-pay

## 2021-08-09 ENCOUNTER — Emergency Department (HOSPITAL_BASED_OUTPATIENT_CLINIC_OR_DEPARTMENT_OTHER)
Admission: EM | Admit: 2021-08-09 | Discharge: 2021-08-10 | Disposition: A | Discharge status: Home / Self Care | Payer: BC Managed Care – PPO | Attending: Emergency Medicine | Admitting: Emergency Medicine

## 2021-08-09 DIAGNOSIS — Z79899 Other long term (current) drug therapy: Secondary | ICD-10-CM | POA: Diagnosis not present

## 2021-08-09 DIAGNOSIS — M5412 Radiculopathy, cervical region: Secondary | ICD-10-CM

## 2021-08-09 DIAGNOSIS — M542 Cervicalgia: Secondary | ICD-10-CM | POA: Diagnosis not present

## 2021-08-09 DIAGNOSIS — I10 Essential (primary) hypertension: Secondary | ICD-10-CM | POA: Diagnosis not present

## 2021-08-09 DIAGNOSIS — Z21 Asymptomatic human immunodeficiency virus [HIV] infection status: Secondary | ICD-10-CM | POA: Diagnosis not present

## 2021-08-09 NOTE — ED Triage Notes (Signed)
Pt c/o posterior neck pain, left UE pain x 3-4 months-states pain is worse with movement and when his neck is stiff-denies injury to neck and UE-NAD-steady gait

## 2021-08-09 NOTE — Telephone Encounter (Signed)
Called patient in regards to requesting an appointment, left patient a voicemail to call us back to get scheduled.

## 2021-08-10 MED ORDER — METHOCARBAMOL 500 MG PO TABS: 500.0000 mg | ORAL_TABLET | Freq: Three times a day (TID) | ORAL | 0 refills | Status: DC | PRN

## 2021-08-10 MED ORDER — PREDNISONE 10 MG PO TABS: 20.0000 mg | ORAL_TABLET | Freq: Two times a day (BID) | ORAL | 0 refills | Status: DC

## 2021-08-10 NOTE — Discharge Instructions (Signed)
Begin taking prednisone as prescribed.  Begin taking Robaxin as prescribed as needed for pain.  Rest.  Follow-up with your primary doctor if symptoms are not improving in the next week.

## 2021-08-10 NOTE — ED Provider Notes (Signed)
[facility] EMERGENCY DEPARTMENT Provider Note   CSN: [id number] Arrival date & time: 08/09/21  2126     History  Chief Complaint  Patient presents with   Neck Pain    Phillip Gill is a 46 y.o. male.  Patient is a 46 year old male with history of HIV disease, hypertension, fibromyalgia, hepatitis C.  Patient presenting today with complaints of neck pain.  This has been bothering him for 3 or 4 months now.  He describes pain to the left lower posterior neck that radiates into his shoulder.  This seems to be worse when he wakes up in the morning.  He denies any specific injury or trauma.  He denies any weakness or numbness.  The history is provided by the patient.  Neck Pain Pain location:  L side Quality:  Stabbing Pain radiates to:  L shoulder Pain severity:  Moderate Onset quality:  Gradual Duration:  3 months Timing:  Constant Progression:  Worsening Chronicity:  New Relieved by:  Nothing Worsened by:  Position     Home Medications Prior to Admission medications   Medication Sig Start Date End Date Taking? Authorizing Provider  amLODipine (NORVASC) 10 MG tablet Take 1 tablet (10 mg total) by mouth daily. 07/10/21   Raspet, Erin K, PA-C  BIKTARVY 50-200-25 MG TABS tablet TAKE 1 TABLET BY MOUTH 1 TIME A DAY. 08/04/21   Veryl Speak, FNP  oseltamivir (TAMIFLU) 75 MG capsule Take 1 capsule (75 mg total) by mouth every 12 (twelve) hours. 07/10/21   Raspet, Noberto Retort, PA-C  sildenafil (VIAGRA) 100 MG tablet Take 0.5-1 tablets (50-100 mg total) by mouth daily as needed for erectile dysfunction. Do not exceed 1 tablet per day. 04/13/21   Veryl Speak, FNP  tobramycin (TOBREX) 0.3 % ophthalmic solution Place 1 drop into the right eye every 6 (six) hours. Patient not taking: No sig reported 11/21/20   Mardella Layman, MD      Allergies    Bactrim [sulfamethoxazole w/trimethoprim (co-trimoxazole)], Sulfa antibiotics, Cymbalta [duloxetine hcl], Duloxetine, Other,  Zoloft [sertraline hcl], Clindamycin/lincomycin, and Neurontin [gabapentin]    Review of Systems   Review of Systems  Musculoskeletal:  Positive for neck pain.  All other systems reviewed and are negative.  Physical Exam Updated Vital Signs BP (!) 152/106 (BP Location: Right Arm)    Pulse 90    Temp 98.2 F (36.8 C) (Oral)    Resp 20    SpO2 99%  Physical Exam Vitals and nursing note reviewed.  Constitutional:      General: He is not in acute distress.    Appearance: Normal appearance. He is not ill-appearing.  HENT:     Head: Normocephalic and atraumatic.  Neck:     Comments: There is tenderness to palpation in the soft tissues of the right posterior cervical region.  There is no bony tenderness or step-off. Pulmonary:     Effort: Pulmonary effort is normal.  Musculoskeletal:     Comments: Strength is 5 out of 5 throughout the left upper extremity.  There is no sensory deficit.  Ulnar and radial pulses are easily palpable and motor and sensation are intact throughout the entire hand.  Skin:    General: Skin is warm and dry.  Neurological:     Mental Status: He is alert.    ED Results / Procedures / Treatments   Labs (all labs ordered are listed, but only abnormal results are displayed) Labs Reviewed - No data to display  EKG None  Radiology No results found.  Procedures Procedures    Medications Ordered in ED Medications - No data to display  ED Course/ Medical Decision Making/ A&P  Patient presenting with pain to the left posterior neck for several months.  The arm is neurovascularly intact.  Patient will be treated with prednisone and Flexeril.  He is to follow-up with primary doctor if not improving.  Final Clinical Impression(s) / ED Diagnoses Final diagnoses:  None    Rx / DC Orders ED Discharge Orders     None         Geoffery Lyons, MD 08/10/21 [phone]

## 2021-08-22 ENCOUNTER — Ambulatory Visit: Payer: BC Managed Care – PPO | Admitting: Family

## 2021-08-30 ENCOUNTER — Other Ambulatory Visit (HOSPITAL_COMMUNITY): Payer: Self-pay

## 2021-09-06 ENCOUNTER — Telehealth: Payer: Self-pay

## 2021-09-06 NOTE — Telephone Encounter (Signed)
Called patient in regards to mychart message sent in, left patient a voicemail to call us back to get appointments scheduled.  ?

## 2021-09-07 ENCOUNTER — Other Ambulatory Visit (HOSPITAL_COMMUNITY): Payer: Self-pay

## 2021-09-07 ENCOUNTER — Ambulatory Visit (INDEPENDENT_AMBULATORY_CARE_PROVIDER_SITE_OTHER): Payer: BC Managed Care – PPO | Admitting: Pharmacist

## 2021-09-07 ENCOUNTER — Other Ambulatory Visit: Payer: Self-pay

## 2021-09-07 DIAGNOSIS — Z23 Encounter for immunization: Secondary | ICD-10-CM | POA: Diagnosis not present

## 2021-09-07 DIAGNOSIS — B20 Human immunodeficiency virus [HIV] disease: Secondary | ICD-10-CM

## 2021-09-07 MED ORDER — SILDENAFIL CITRATE 100 MG PO TABS
50.0000 mg | ORAL_TABLET | Freq: Every day | ORAL | 1 refills | Status: DC | PRN
Start: 2021-09-07 — End: 2022-01-22

## 2021-09-07 NOTE — Progress Notes (Signed)
? ?HPI: Phillip Gill is a 46 y.o. male who presents to the [facility] pharmacy clinic for HIV medical management. ? ?Patient Active Problem List  ? Diagnosis Date Noted  ? 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 Cervical pain 02/07/2021  ? Erectile dysfunction 08/29/2020  ? Healthcare maintenance 04/05/2020  ? Foot lesion 12/24/2019  ? Human immunodeficiency virus (HIV) disease (HCC) 08/25/2019  ? Gonorrhea 02/11/2019  ? Hepatitis B surface antigen positive 09/05/2018  ? Overweight 07/20/2015  ? Colitis 03/08/2015  ? HIV (human immunodeficiency virus infection) (HCC)   ? Tobacco abuse   ? LGV (lymphogranuloma venereum)   ? Primary syphilis   ? Chronic hepatitis B (HCC) 10/03/2014  ? HTN (hypertension)   ? Gynecomastia 04/30/2014  ? 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 Chronic pain associated with significant psychosocial dysfunction 09/18/2012  ? Fibromyalgia muscle pain 02/11/2012  ? Depression 12/07/2011  ? Anxiety 02/02/2011  ? ? ?Patient's Medications  ?New Prescriptions  ? No medications on file  ?Previous Medications  ? AMLODIPINE (NORVASC) 10 MG TABLET    Take 1 tablet (10 mg total) by mouth daily.  ? BIKTARVY 50-200-25 MG TABS TABLET    TAKE 1 TABLET BY MOUTH 1 TIME A DAY.  ? METHOCARBAMOL (ROBAXIN) 500 MG TABLET    Take 1 tablet (500 mg total) by mouth every 8 (eight) hours as needed for muscle spasms.  ? OSELTAMIVIR (TAMIFLU) 75 MG CAPSULE    Take 1 capsule (75 mg total) by mouth every 12 (twelve) hours.  ? PREDNISONE (DELTASONE) 10 MG TABLET    Take 2 tablets (20 mg total) by mouth 2 (two) times daily.  ? SILDENAFIL (VIAGRA) 100 MG TABLET    Take 0.5-1 tablets (50-100 mg total) by mouth daily as needed for erectile dysfunction. Do not exceed 1 tablet per day.  ? TOBRAMYCIN (TOBREX) 0.3 % OPHTHALMIC SOLUTION    Place 1 drop into the right eye every 6 (six) hours.  ?Modified Medications  ? No medications on file  ?Discontinued Medications  ? No medications on file  ? ? ?Allergies: ?Allergies  ?Allergen Reactions  ? Bactrim  [Sulfamethoxazole W/Trimethoprim (Co-Trimoxazole)] Hives and Shortness Of Breath  ? Sulfa Antibiotics Hives and Shortness Of Breath  ? Cymbalta [Duloxetine Hcl] Diarrhea  ? Duloxetine Diarrhea  ? Other Hives and Swelling  ?  [employer] toothpaste   ? Zoloft [Sertraline Hcl] Diarrhea  ? Clindamycin/Lincomycin Rash  ? Neurontin [Gabapentin] Rash  ? ? ?Past Medical History: ?Past Medical History:  ?Diagnosis Date  ? Anxiety   ? Arthritis   ? "neck" (02/16/2015)  ? Chronic hepatitis B (HCC)   ? SECONDARY TO HIV  ? Chronic lower back pain   ? Depression   ? Fibromyalgia   ? Genital warts   ? HIV disease (HCC) 02/28/2015  ? HIV infection (HCC)   ? followed by Dr. Luciana Axe- sees him every 4 months  ? Hypertension   ? IBS (irritable bowel syndrome)   ? Migraine   ? "none in years" (02/16/2015  ? Overweight 07/20/2015  ? Renal insufficiency 12/29/2013  ? ? ?Social History: ?Social History  ? ?Socioeconomic History  ? Marital status: Single  ?  Spouse name: Not on file  ? Number of children: Not on file  ? Years of education: Not on file  ? Highest education level: Not on file  ?Occupational History  ? Not on file  ?Tobacco Use  ? Smoking status: Former  ?  Packs/day: 1.00  ?  Years:  18.00  ?  Pack years: 18.00  ?  Types: Cigarettes  ?  Quit date: 07/02/2021  ?  Years since quitting: 0.1  ? Smokeless tobacco: Never  ?Vaping Use  ? Vaping Use: Some days  ? Substances: Nicotine  ?Substance and Sexual Activity  ? Alcohol use: Yes  ?  Comment: occ  ? Drug use: No  ? Sexual activity: Not on file  ?Other Topics Concern  ? Not on file  ?Social History Narrative  ? 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   ? Finished HS.  ? Doing online classes with [employer]- studying accounting  ? Previously 2 years of classes at [employer].   ? Has Partner- [name]- together for 1 year.   ? Has 2 girls- (born in 2000).   ?   ?   ? ?Social Determinants of Health  ? ?Financial Resource Strain: Not  on file  ?Food Insecurity: Not on file  ?Transportation Needs: Not on file  ?Physical Activity: Not on file  ?Stress: Not on file  ?Social Connections: Not on file  ? ? ?Labs: ?Lab Results  ?Component Value Date  ? HIV1RNAQUANT 40 (H) 07/20/2021  ? HIV1RNAQUANT 109 (H) 03/30/2021  ? HIV1RNAQUANT 97 (H) 01/24/2021  ? CD4TABS 1,261 07/20/2021  ? CD4TABS 935 03/30/2021  ? CD4TABS 1,380 01/24/2021  ? ? ?RPR and STI ?Lab Results  ?Component Value Date  ? LABRPR NON-REACTIVE 07/20/2021  ? LABRPR NON-REACTIVE 01/24/2021  ? LABRPR NON-REACTIVE 08/15/2020  ? LABRPR NON-REACTIVE 03/22/2020  ? LABRPR NON-REACTIVE 12/10/2019  ? ? ?STI Results GC CT  ?07/20/2021 Negative Negative  ?01/24/2021 Negative Negative  ?12/06/2020 Negative Negative  ?06/17/2019 Negative Negative  ?01/22/2019 **POSITIVE**(A) Negative  ?01/15/2018 Negative Negative  ?07/31/2017 Negative Negative  ?07/31/2017 Negative Negative  ?07/17/2017 Negative Negative  ?07/06/2015 Negative Negative  ?02/28/2015 *Negative *Negative  ?02/14/2015 Negative Negative  ?10/04/2014 Negative **POSITIVE**(A)  ?08/30/2014 NG: Negative CT: Negative  ? ? ?Hepatitis B ?Lab Results  ?Component Value Date  ? HEPBSAB NON-REACTIVE 03/30/2021  ? HEPBSAG REACTIVE (A) 03/30/2021  ? ?Hepatitis C ?No results found for: HEPCAB, HCVRNAPCRQN ?Hepatitis A ?No results found for: HAV ?Lipids: ?Lab Results  ?Component Value Date  ? CHOL 160 01/24/2021  ? TRIG 179 (H) 01/24/2021  ? HDL 37 (L) 01/24/2021  ? CHOLHDL 4.3 01/24/2021  ? VLDL 16 02/14/2015  ? LDLCALC 95 01/24/2021  ? ? ?Current HIV Regimen: ?Biktarvy 50-200-25 mg once daily ? ?Assessment: ?He presents today for HIV medication management.  ? ?Stated he had trouble acquiring Biktarvy due to insurance issues and the time it takes for [facility] to [facility] specialty [facility]. Missed one month of Biktarvy at end of 2022 but since has been adherent. Will check VL today. 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  Counseled on contacting pharmacy 1-2 weeks prior to running out of medications and contacting [facility] if needing samples.  ? ?Counseled on Prevnar 20. He agreed to immunization at this time. Also requested flu vaccine. ? ?Plan: ?- Continue Biktarvy 50-200-25 mg once daily ?- Vaccines given: Prevnar 20 and Influenza ?- HIV VL and G/C RNA ?- Refilled Viagra. ?- Call with any issues or questions ? ?Thank you for allowing pharmacy to be apart of this patient's care ? ?Feliberto Gottron, PharmD Candidate ?

## 2021-09-08 LAB — C. TRACHOMATIS/N. GONORRHOEAE RNA
C. trachomatis RNA, TMA: NOT DETECTED
N. gonorrhoeae RNA, TMA: DETECTED — AB

## 2021-09-10 LAB — HIV-1 RNA QUANT-NO REFLEX-BLD
HIV 1 RNA Quant: 63 Copies/mL — ABNORMAL HIGH
HIV-1 RNA Quant, Log: 1.8 Log cps/mL — ABNORMAL HIGH

## 2021-09-11 ENCOUNTER — Telehealth: Payer: Self-pay

## 2021-09-11 NOTE — Telephone Encounter (Signed)
Called patient to discuss positive gonorrhea result. Patient did not answer and a HIPAA compliant voicemail was left asking patient to return the call.   ? ?Joseph Art, Pharm.D. ?PGY-1 Pharmacy Resident ?09/11/2021 10:34 AM ?

## 2021-09-12 ENCOUNTER — Encounter: Payer: Self-pay | Admitting: Pharmacist

## 2021-09-13 ENCOUNTER — Ambulatory Visit (INDEPENDENT_AMBULATORY_CARE_PROVIDER_SITE_OTHER): Payer: BC Managed Care – PPO | Admitting: Pharmacist

## 2021-09-13 ENCOUNTER — Other Ambulatory Visit: Payer: Self-pay

## 2021-09-13 DIAGNOSIS — A549 Gonococcal infection, unspecified: Secondary | ICD-10-CM | POA: Diagnosis not present

## 2021-09-13 MED ORDER — CEFTRIAXONE SODIUM 500 MG IJ SOLR
500.0000 mg | Freq: Once | INTRAMUSCULAR | Status: AC
  Administered 2021-09-13: 500 mg via INTRAMUSCULAR

## 2021-09-13 NOTE — Progress Notes (Signed)
? ?HPI: Phillip Gill is a 46 y.o. male who presents to the [facility] pharmacy clinic for gonorrhea treatment. ? ?Patient Active Problem List  ? Diagnosis Date Noted  ? 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 Cervical pain 02/07/2021  ? Erectile dysfunction 08/29/2020  ? Healthcare maintenance 04/05/2020  ? Foot lesion 12/24/2019  ? Human immunodeficiency virus (HIV) disease (HCC) 08/25/2019  ? Gonorrhea 02/11/2019  ? Hepatitis B surface antigen positive 09/05/2018  ? Overweight 07/20/2015  ? Colitis 03/08/2015  ? HIV (human immunodeficiency virus infection) (HCC)   ? Tobacco abuse   ? LGV (lymphogranuloma venereum)   ? Primary syphilis   ? Chronic hepatitis B (HCC) 10/03/2014  ? HTN (hypertension)   ? Gynecomastia 04/30/2014  ? 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 Chronic pain associated with significant psychosocial dysfunction 09/18/2012  ? Fibromyalgia muscle pain 02/11/2012  ? Depression 12/07/2011  ? Anxiety 02/02/2011  ? ? ?Patient's Medications  ?New Prescriptions  ? No medications on file  ?Previous Medications  ? AMLODIPINE (NORVASC) 10 MG TABLET    Take 1 tablet (10 mg total) by mouth daily.  ? BIKTARVY 50-200-25 MG TABS TABLET    TAKE 1 TABLET BY MOUTH 1 TIME A DAY.  ? METHOCARBAMOL (ROBAXIN) 500 MG TABLET    Take 1 tablet (500 mg total) by mouth every 8 (eight) hours as needed for muscle spasms.  ? OSELTAMIVIR (TAMIFLU) 75 MG CAPSULE    Take 1 capsule (75 mg total) by mouth every 12 (twelve) hours.  ? PREDNISONE (DELTASONE) 10 MG TABLET    Take 2 tablets (20 mg total) by mouth 2 (two) times daily.  ? SILDENAFIL (VIAGRA) 100 MG TABLET    Take 0.5-1 tablets (50-100 mg total) by mouth daily as needed for erectile dysfunction. Do not exceed 1 tablet per day.  ? TOBRAMYCIN (TOBREX) 0.3 % OPHTHALMIC SOLUTION    Place 1 drop into the right eye every 6 (six) hours.  ?Modified Medications  ? No medications on file  ?Discontinued Medications  ? No medications on file  ? ? ?Allergies: ?Allergies  ?Allergen Reactions  ? Bactrim  [Sulfamethoxazole W/Trimethoprim (Co-Trimoxazole)] Hives and Shortness Of Breath  ? Sulfa Antibiotics Hives and Shortness Of Breath  ? Cymbalta [Duloxetine Hcl] Diarrhea  ? Duloxetine Diarrhea  ? Other Hives and Swelling  ?  Colgate toothpaste   ? Zoloft [Sertraline Hcl] Diarrhea  ? Clindamycin/Lincomycin Rash  ? Neurontin [Gabapentin] Rash  ? ? ?Past Medical History: ?Past Medical History:  ?Diagnosis Date  ? Anxiety   ? Arthritis   ? "neck" (02/16/2015)  ? Chronic hepatitis B (HCC)   ? SECONDARY TO HIV  ? Chronic lower back pain   ? Depression   ? Fibromyalgia   ? Genital warts   ? HIV disease (HCC) 02/28/2015  ? HIV infection (HCC)   ? followed by Dr. Luciana Axe- sees him every 4 months  ? Hypertension   ? IBS (irritable bowel syndrome)   ? Migraine   ? "none in years" (02/16/2015  ? Overweight 07/20/2015  ? Renal insufficiency 12/29/2013  ? ? ?Social History: ?Social History  ? ?Socioeconomic History  ? Marital status: Single  ?  Spouse name: Not on file  ? Number of children: Not on file  ? Years of education: Not on file  ? Highest education level: Not on file  ?Occupational History  ? Not on file  ?Tobacco Use  ? Smoking status: Former  ?  Packs/day: 1.00  ?  Years: 18.00  ?  Pack years: 18.00  ?  Types: Cigarettes  ?  Quit date: 07/02/2021  ?  Years since quitting: 0.2  ? Smokeless tobacco: Never  ?Vaping Use  ? Vaping Use: Some days  ? Substances: Nicotine  ?Substance and Sexual Activity  ? Alcohol use: Yes  ?  Comment: occ  ? Drug use: No  ? Sexual activity: Not on file  ?Other Topics Concern  ? Not on file  ?Social History Narrative  ? 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   ? Finished HS.  ? Doing online classes with [employer]- studying accounting  ? Previously 2 years of classes at [employer].   ? Has Partner- [name]- together for 1 year.   ? Has 2 girls- (born in 2000).   ?   ?   ? ?Social Determinants of Health  ? ?Financial Resource Strain: Not  on file  ?Food Insecurity: Not on file  ?Transportation Needs: Not on file  ?Physical Activity: Not on file  ?Stress: Not on file  ?Social Connections: Not on file  ? ? ?Labs: ?Lab Results  ?Component Value Date  ? HIV1RNAQUANT 63 (H) 09/07/2021  ? HIV1RNAQUANT 40 (H) 07/20/2021  ? HIV1RNAQUANT 109 (H) 03/30/2021  ? CD4TABS 1,261 07/20/2021  ? CD4TABS 935 03/30/2021  ? CD4TABS 1,380 01/24/2021  ? ? ?RPR and STI ?Lab Results  ?Component Value Date  ? LABRPR NON-REACTIVE 07/20/2021  ? LABRPR NON-REACTIVE 01/24/2021  ? LABRPR NON-REACTIVE 08/15/2020  ? LABRPR NON-REACTIVE 03/22/2020  ? LABRPR NON-REACTIVE 12/10/2019  ? ? ?STI Results GC CT  ?07/20/2021 Negative Negative  ?01/24/2021 Negative Negative  ?12/06/2020 Negative Negative  ?06/17/2019 Negative Negative  ?01/22/2019 **POSITIVE**(A) Negative  ?01/15/2018 Negative Negative  ?07/31/2017 Negative Negative  ?07/31/2017 Negative Negative  ?07/17/2017 Negative Negative  ?07/06/2015 Negative Negative  ?02/28/2015 *Negative *Negative  ?02/14/2015 Negative Negative  ?10/04/2014 Negative **POSITIVE**(A)  ?08/30/2014 NG: Negative CT: Negative  ? ? ?Hepatitis B ?Lab Results  ?Component Value Date  ? HEPBSAB NON-REACTIVE 03/30/2021  ? HEPBSAG REACTIVE (A) 03/30/2021  ? ?Hepatitis C ?No results found for: HEPCAB, HCVRNAPCRQN ?Hepatitis A ?No results found for: HAV ?Lipids: ?Lab Results  ?Component Value Date  ? CHOL 160 01/24/2021  ? TRIG 179 (H) 01/24/2021  ? HDL 37 (L) 01/24/2021  ? CHOLHDL 4.3 01/24/2021  ? VLDL 16 02/14/2015  ? LDLCALC 95 01/24/2021  ? ? ?Assessment: ?Phillip Gill presents today for gonorrhea treatment. Patient's urine returned positive on 09/08/21. Last STI screening was on 07/20/21 and was negative. No known allergies to any antibiotics.  ? ?Administered ceftriaxone 500 mg in right upper outer quadrant of the gluteal muscle. Patient tolerated well. Advised to abstain from sexual activity for 10 days and to ask partners to get tested and treated.  Answered all questions. Patient  will call with any issues. ? ?Plan: ?- Administered Ceftriaxone 500 mg IM in gluteal muscle ?- Call with any issues or questions ? ?Valeda Malm, Pharm.D. ?PGY-1 Pharmacy Resident ?09/13/2021 2:43 PM ?

## 2021-09-18 ENCOUNTER — Telehealth: Payer: Self-pay

## 2021-09-18 NOTE — Telephone Encounter (Signed)
Called patient in regards to mychart message sent, left patient a voicemail to call us back to get it figured out.  ?

## 2021-09-19 ENCOUNTER — Telehealth: Payer: Self-pay

## 2021-09-19 NOTE — Telephone Encounter (Signed)
Left patient a voice mail to call back to schedule office visit with [facility] after a request for an appointment  ?

## 2021-09-26 ENCOUNTER — Encounter: Payer: Self-pay | Admitting: Family

## 2021-09-26 ENCOUNTER — Ambulatory Visit: Payer: BC Managed Care – PPO

## 2021-09-26 ENCOUNTER — Other Ambulatory Visit: Payer: Self-pay

## 2021-09-26 ENCOUNTER — Ambulatory Visit (INDEPENDENT_AMBULATORY_CARE_PROVIDER_SITE_OTHER): Payer: BC Managed Care – PPO | Admitting: Family

## 2021-09-26 VITALS — BP 127/88 | HR 91 | Resp 16 | Ht 72.0 in | Wt 205.0 lb

## 2021-09-26 DIAGNOSIS — Z0000 Encounter for general adult medical examination without abnormal findings: Secondary | ICD-10-CM | POA: Diagnosis not present

## 2021-09-26 DIAGNOSIS — B20 Human immunodeficiency virus [HIV] disease: Secondary | ICD-10-CM | POA: Diagnosis not present

## 2021-09-26 DIAGNOSIS — Z0289 Encounter for other administrative examinations: Secondary | ICD-10-CM

## 2021-09-26 DIAGNOSIS — B181 Chronic viral hepatitis B without delta-agent: Secondary | ICD-10-CM | POA: Diagnosis not present

## 2021-09-26 MED ORDER — BIKTARVY 50-200-25 MG PO TABS: ORAL_TABLET | ORAL | 4 refills | Status: DC

## 2021-09-26 NOTE — Patient Instructions (Signed)
Nice to see you.  Continue to take your medication daily as prescribed.  Refills have been sent to the pharmacy.  Plan for follow up in 3 months or sooner if needed with lab work on the same day.  Have a great day and stay safe!  

## 2021-09-26 NOTE — Assessment & Plan Note (Signed)
?   Discussed importance of safe sexual practice and condom use. Condoms offered. ?? Vaccines up to date. Vaccination record provided.  ?

## 2021-09-26 NOTE — Assessment & Plan Note (Signed)
[name] has adequately controlled virus with good adherence and tolerance to his ART regimen of Biktarvy. No signs/symptoms of opportunistic infection. We reviewed lab work and discussed plan of care. Applied for financial assistance with potential change of jobs/loss of insurance. Continue current dose of Biktarvy. Samples provided in the event he is still awaiting coverage. Samples recorded in pharmacy log. Plan for follow up in 3 months or sooner if needed with lab work 1-2 weeks prior to appointment.  ?

## 2021-09-26 NOTE — Progress Notes (Signed)
? ? ?Brief Narrative  ? ?Patient ID: Phillip Gill, male    DOB: 09-27-1975, 46 y.o.   MRN: [id number] ? ?Phillip Gill is a 46 y/o AA male diagnosed with HIV disease in July 2012 with risk factor of MSM. Initial viral load of 99,000 and CD4 of 510 entering care at Stage 1. [id number] negative. Initial genotype with no significant resistance. Previous ART experience with Prezista, Truvada, and Biktarvy.  ? ?Subjective:  ?  ?Chief Complaint  ?Patient presents with  ? Employment Physical  ? ? ?HPI: ? ?Phillip Gill is a 46 y.o. male with HIV disease last seen on 04/13/21 with adequately controlled virus and good adherence and tolerance to his ART regimen of Biktarvy. Viral load was 109. Most recent lab work with viral load of 63 and CD4 count 1261. STD testing was positive for gonorrhea for which he was treated with Ceftriaxone. Here today for follow up. ? ?Phillip Gill has been taking his Biktarvy as prescribed although has had some issues getting medication from the pharmacy which should be improved now. 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 Feeling well today with no new concerns/complaints.  ? ?Phillip Gill will likely be losing his insurance in the next month and will need to apply for financial assistance. Denies feelings of being down, depressed or hopeless. Condoms offered. Vaccinations are up to date.  ? ? ?Allergies  ?Allergen Reactions  ? Bactrim [Sulfamethoxazole W/Trimethoprim (Co-Trimoxazole)] Hives and Shortness Of Breath  ? Sulfa Antibiotics Hives and Shortness Of Breath  ? Cymbalta [Duloxetine Hcl] Diarrhea  ? Duloxetine Diarrhea  ? Other Hives and Swelling  ?  [employer]gate toothpaste   ? Zoloft [Sertraline Hcl] Diarrhea  ? Clindamycin/Lincomycin Rash  ? Neurontin [Gabapentin] Rash  ? ? ? ? ?Outpatient Medications Prior to Visit  ?Medication Sig Dispense Refill  ? amLODipine (NORVASC) 10 MG tablet Take 1 tablet (10 mg total) by mouth daily. 30  tablet 1  ? sildenafil (VIAGRA) 100 MG tablet Take 0.5-1 tablets (50-100 mg total) by mouth daily as needed for erectile dysfunction. Do not exceed 1 tablet per day. 30 tablet 1  ? BIKTARVY 50-200-25 MG TABS tablet TAKE 1 TABLET BY MOUTH 1 TIME A DAY. 30 tablet 4  ? methocarbamol (ROBAXIN) 500 MG tablet Take 1 tablet (500 mg total) by mouth every 8 (eight) hours as needed for muscle spasms. (Patient not taking: Reported on 09/26/2021) 15 tablet 0  ? oseltamivir (TAMIFLU) 75 MG capsule Take 1 capsule (75 mg total) by mouth every 12 (twelve) hours. (Patient not taking: Reported on 09/26/2021) 10 capsule 0  ? predniSONE (DELTASONE) 10 MG tablet Take 2 tablets (20 mg total) by mouth 2 (two) times daily. (Patient not taking: Reported on 09/26/2021) 20 tablet 0  ? tobramycin (TOBREX) 0.3 % ophthalmic solution Place 1 drop into the right eye every 6 (six) hours. (Patient not taking: Reported on 02/07/2021) 5 mL 0  ? ?No facility-administered medications prior to visit.  ? ? ? ?Past Medical History:  ?Diagnosis Date  ? Anxiety   ? Arthritis   ? "neck" (02/16/2015)  ? Chronic hepatitis B (HCC)   ? SECONDARY TO HIV  ? Chronic lower back pain   ? Depression   ? Fibromyalgia   ? Genital warts   ? HIV disease (HCC) 02/28/2015  ? HIV infection (HCC)   ? followed by Dr. Luciana Axeomer- sees him every 4 months  ? Hypertension   ?  IBS (irritable bowel syndrome)   ? Migraine   ? "none in years" (02/16/2015  ? Overweight 07/20/2015  ? Renal insufficiency 12/29/2013  ? ? ? ?Past Surgical History:  ?Procedure Laterality Date  ? CO2 LASER APPLICATION N/A 02/11/2013  ? Procedure: CO2 LASER APPLICATION;  Surgeon: Romie Levee, MD;  Location: [facility];  Service: General;  Laterality: N/A;  ? FOOT SURGERY    ? HIGH RESOLUTION ANOSCOPY N/A 02/11/2013  ? Procedure: HIGH RESOLUTION ANOSCOPY WITH BIOPSY, LASER ABLATION;  Surgeon: Romie Levee, MD;  Location: [facility];  Service: General;  Laterality: N/A;  ? WISDOM TOOTH  EXTRACTION    ? ? ? ? ?Review of Systems  ?Constitutional:  Negative for appetite change, chills, fatigue, fever and unexpected weight change.  ?Eyes:  Negative for visual disturbance.  ?Respiratory:  Negative for cough, chest tightness, shortness of breath and wheezing.   ?Cardiovascular:  Negative for chest pain and leg swelling.  ?Gastrointestinal:  Negative for abdominal pain, constipation, diarrhea, nausea and vomiting.  ?Genitourinary:  Negative for dysuria, flank pain, frequency, genital sores, hematuria and urgency.  ?Skin:  Negative for rash.  ?Allergic/Immunologic: Negative for immunocompromised state.  ?Neurological:  Negative for dizziness and headaches.  ?   ?Objective:  ?  ?BP 127/88   Pulse 91   Resp 16   Ht 6' (1.829 Gill)   Wt 205 lb (93 kg)   SpO2 97%   BMI 27.80 kg/Gill?  ?Nursing note and vital signs reviewed. ? ?Physical Exam ?Constitutional:   ?   General: He is not in acute distress. ?   Appearance: He is well-developed.  ?Eyes:  ?   Conjunctiva/sclera: Conjunctivae normal.  ?Cardiovascular:  ?   Rate and Rhythm: Normal rate and regular rhythm.  ?   Heart sounds: Normal heart sounds. No murmur heard. ?  No friction rub. No gallop.  ?Pulmonary:  ?   Effort: Pulmonary effort is normal. No respiratory distress.  ?   Breath sounds: Normal breath sounds. No wheezing or rales.  ?Chest:  ?   Chest wall: No tenderness.  ?Abdominal:  ?   General: Bowel sounds are normal.  ?   Palpations: Abdomen is soft.  ?   Tenderness: There is no abdominal tenderness.  ?Musculoskeletal:  ?   Cervical back: Neck supple.  ?Lymphadenopathy:  ?   Cervical: No cervical adenopathy.  ?Skin: ?   General: Skin is warm and dry.  ?   Findings: No rash.  ?Neurological:  ?   Mental Status: He is alert and oriented to person, place, and time.  ?Psychiatric:     ?   Behavior: Behavior normal.     ?   Thought Content: Thought content normal.     ?   Judgment: Judgment normal.  ? ? ? ? ?  09/26/2021  ?  9:23 AM 02/07/2021  ?  8:52 AM  12/20/2020  ? 10:07 AM 08/29/2020  ? 10:28 AM 04/05/2020  ? 11:39 AM  ?Depression screen PHQ 2/9  ?Decreased Interest 0 0 0 0 0  ?Down, Depressed, Hopeless 0 0 0 0 0  ?PHQ - 2 Score 0 0 0 0 0  ?  ?   ?Assessment & Plan:  ? ? ?Patient Active Problem List  ? Diagnosis Date Noted  ? Encounter for physical examination related to employment 09/26/2021  ? 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 Cervical pain 02/07/2021  ? Erectile dysfunction 08/29/2020  ? Healthcare maintenance 04/05/2020  ?  Foot lesion 12/24/2019  ? Human immunodeficiency virus (HIV) disease (HCC) 08/25/2019  ? Gonorrhea 02/11/2019  ? Hepatitis B surface antigen positive 09/05/2018  ? Overweight 07/20/2015  ? Colitis 03/08/2015  ? HIV (human immunodeficiency virus infection) (HCC)   ? Tobacco abuse   ? LGV (lymphogranuloma venereum)   ? Primary syphilis   ? Chronic hepatitis B (HCC) 10/03/2014  ? HTN (hypertension)   ? Gynecomastia 04/30/2014  ? 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 Chronic pain associated with significant psychosocial dysfunction 09/18/2012  ? Fibromyalgia muscle pain 02/11/2012  ? Depression 12/07/2011  ? Anxiety 02/02/2011  ? ? ? ?Problem List Items Addressed This Visit   ? ?  ? Digestive  ? Chronic hepatitis B (HCC) (Chronic)  ?  Phillip Gill has a positive Hepatitis B surface antigen with no detectable viral load and suspect he has inactive Hepatitis B. He is on Biktarvy which will cover. Continue to monitor.  ?  ?  ? Relevant Medications  ? bictegravir-emtricitabine-tenofovir AF (BIKTARVY) 50-200-25 MG TABS tablet  ?  ? Other  ? Human immunodeficiency virus (HIV) disease (HCC) (Chronic)  ?  Phillip Gill has adequately controlled virus with good adherence and tolerance to his ART regimen of Biktarvy. No signs/symptoms of opportunistic infection. We reviewed lab work and discussed plan of care. Applied for financial assistance with potential change of jobs/loss of insurance. Continue current dose of Biktarvy. Samples provided in the event he is still  awaiting coverage. Samples recorded in pharmacy log. Plan for follow up in 3 months or sooner if needed with lab work 1-2 weeks prior to appointment.  ?  ?  ? Relevant Medications  ? bictegravir-emtricitabine-tenofovir AF

## 2021-09-26 NOTE — Assessment & Plan Note (Signed)
Reviewed and completed appropriate paperwork per his request. Overall good health and well controlled chronic conditions that do no limit his availability to work.  ?

## 2021-09-26 NOTE — Assessment & Plan Note (Signed)
Phillip Gill has a positive Hepatitis B surface antigen with no detectable viral load and suspect he has inactive Hepatitis B. He is on Biktarvy which will cover. Continue to monitor.  ?

## 2021-10-02 ENCOUNTER — Other Ambulatory Visit: Payer: Self-pay | Admitting: Pharmacist

## 2021-10-02 DIAGNOSIS — B20 Human immunodeficiency virus [HIV] disease: Secondary | ICD-10-CM

## 2021-10-02 MED ORDER — BIKTARVY 50-200-25 MG PO TABS: 1.0000 | ORAL_TABLET | Freq: Every day | ORAL | 0 refills | Status: AC

## 2021-10-02 NOTE — Progress Notes (Signed)
Medication Samples have been provided to the patient. ? ?Drug name: Biktarvy        ?Strength: 50/200/25 mg       ?Qty: 4 bottles (28 tablets)  ?LOT: [id number]   ?Exp.Date: 04/02/2023 ? ?Dosing instructions: Take one tablet by mouth once daily ? ?The patient has been instructed regarding the correct time, dose, and frequency of taking this medication, including desired effects and most common side effects.  ? ?Osmani Kersten L. Quenten Nawaz, PharmD, BCIDP, AAHIVP, CPP ?Clinical Pharmacist Practitioner ?Infectious Diseases Clinical Pharmacist ?[facility] ?06/13/2020, 10:07 AM ? ?

## 2021-10-09 NOTE — Telephone Encounter (Signed)
Patient stopped by front desk to from off forms for [place], FNP. States that forms are overdue and employer needs them completed as soon as possible. Informed patient that provider is not in clinic this week. Will forward message to make him aware forms are printed and in his basket in triage.  ?Relayed to patient that forms can take about 1-2 weeks to complete. Will call with update.  ?Juanita Laster, RMA ? ?

## 2021-10-12 ENCOUNTER — Other Ambulatory Visit: Payer: Self-pay

## 2021-10-12 DIAGNOSIS — B20 Human immunodeficiency virus [HIV] disease: Secondary | ICD-10-CM

## 2021-10-12 MED ORDER — BIKTARVY 50-200-25 MG PO TABS: ORAL_TABLET | ORAL | 4 refills | Status: DC

## 2021-10-16 ENCOUNTER — Ambulatory Visit: Payer: Self-pay | Admitting: Family

## 2021-10-16 ENCOUNTER — Ambulatory Visit: Payer: Self-pay

## 2021-10-16 ENCOUNTER — Other Ambulatory Visit: Payer: Self-pay

## 2021-10-16 MED ORDER — AMLODIPINE BESYLATE 10 MG PO TABS: 10.0000 mg | ORAL_TABLET | Freq: Every day | ORAL | 1 refills | Status: DC

## 2021-10-17 ENCOUNTER — Ambulatory Visit: Payer: BC Managed Care – PPO | Admitting: Family

## 2021-11-22 MED ORDER — AMLODIPINE BESYLATE 10 MG PO TABS
10.0000 mg | ORAL_TABLET | Freq: Every day | ORAL | 5 refills | Status: DC
Start: 2021-11-22 — End: 2022-01-09

## 2021-11-24 ENCOUNTER — Telehealth: Payer: Self-pay

## 2021-11-24 NOTE — Telephone Encounter (Signed)
Patient dropped of forms that need to be signed by provider. Forms were for [employer] medical evaluation questionnaire. Asked him to please forward the email from his employer to the [employer] email since there is nowhere on the forms he dropped off for a signature. Advised patient that RN would provide forms to [place], but that he would need to review them and may not be able to complete them.  Sandie Ano, RN

## 2022-01-09 ENCOUNTER — Ambulatory Visit: Payer: Self-pay | Admitting: Family

## 2022-01-09 ENCOUNTER — Other Ambulatory Visit: Payer: Self-pay | Admitting: Family

## 2022-01-09 NOTE — Telephone Encounter (Signed)
Appt 01/09/22

## 2022-01-22 ENCOUNTER — Ambulatory Visit (INDEPENDENT_AMBULATORY_CARE_PROVIDER_SITE_OTHER): Payer: Self-pay | Admitting: Family

## 2022-01-22 ENCOUNTER — Encounter: Payer: Self-pay | Admitting: Family

## 2022-01-22 ENCOUNTER — Ambulatory Visit: Payer: Self-pay

## 2022-01-22 ENCOUNTER — Other Ambulatory Visit: Payer: Self-pay

## 2022-01-22 ENCOUNTER — Other Ambulatory Visit (HOSPITAL_COMMUNITY): Admission: RE | Admit: 2022-01-22 | Payer: BC Managed Care – PPO | Source: Ambulatory Visit

## 2022-01-22 VITALS — BP 128/89 | HR 79 | Temp 98.2°F | Resp 16 | Ht 72.0 in | Wt 209.8 lb

## 2022-01-22 DIAGNOSIS — B181 Chronic viral hepatitis B without delta-agent: Secondary | ICD-10-CM

## 2022-01-22 DIAGNOSIS — R0683 Snoring: Secondary | ICD-10-CM

## 2022-01-22 DIAGNOSIS — N521 Erectile dysfunction due to diseases classified elsewhere: Secondary | ICD-10-CM

## 2022-01-22 DIAGNOSIS — Z113 Encounter for screening for infections with a predominantly sexual mode of transmission: Secondary | ICD-10-CM

## 2022-01-22 DIAGNOSIS — I10 Essential (primary) hypertension: Secondary | ICD-10-CM

## 2022-01-22 DIAGNOSIS — Z87891 Personal history of nicotine dependence: Secondary | ICD-10-CM

## 2022-01-22 DIAGNOSIS — B20 Human immunodeficiency virus [HIV] disease: Secondary | ICD-10-CM

## 2022-01-22 DIAGNOSIS — Z0000 Encounter for general adult medical examination without abnormal findings: Secondary | ICD-10-CM

## 2022-01-22 MED ORDER — BIKTARVY 50-200-25 MG PO TABS: ORAL_TABLET | ORAL | 4 refills | Status: DC

## 2022-01-22 MED ORDER — SILDENAFIL CITRATE 100 MG PO TABS: 50.0000 mg | ORAL_TABLET | Freq: Every day | ORAL | 1 refills | Status: DC | PRN

## 2022-01-22 MED ORDER — AMLODIPINE BESYLATE 10 MG PO TABS: ORAL_TABLET | ORAL | 5 refills | Status: DC

## 2022-01-22 NOTE — Assessment & Plan Note (Signed)
Stable with current dose of sildenafil. No adverse side effects or priapism when using medication. Continue sildenafil as needed.

## 2022-01-22 NOTE — Assessment & Plan Note (Signed)
Mr. Siedlecki continues to have well-controlled virus with good adherence and tolerance to his ART regimen of Biktarvy.  Reviewed previous lab work and discussed plan of care.  Check blood work today.  Renew financial assistance.  Continue current dose of Biktarvy.  Plan for follow-up in 4 months or sooner if needed with lab work on the same day.

## 2022-01-22 NOTE — Assessment & Plan Note (Signed)
   Discussed importance of safe sexual practice and condom use. Condoms offered.   Vaccinations are up to date.  Due for routine dental care. Referral placed to [facility].   Due for colonoscopy screening and will pursue financial assistance to complete testing.

## 2022-01-22 NOTE — Progress Notes (Signed)
Brief Narrative   Patient ID: Phillip Gill, male    DOB: Jun 29, 1976, 46 y.o.   MRN: [id number]  Mr. Sibal is a 46 y/o AA male diagnosed with HIV disease in July 2012 with risk factor of MSM. Initial viral load of 99,000 and CD4 of 510 entering care at Stage 1. [id number] negative. Initial genotype with no significant resistance. Previous ART experience with Prezista, Truvada, and Biktarvy.   Subjective:    Chief Complaint  Patient presents with   Follow-up    3 month follow up -     HPI:  Phillip Gill is a 46 y.o. male with HIV disease and chronic hepatitis B last seen on 09/26/2021 with well-controlled virus and good adherence and tolerance to his ART regimen of Biktarvy.  Viral load was undetectable with CD4 count of 1261.  Here today for routine follow-up.  [name] continues to take his Biktarvy daily as prescribed with no adverse side effects.  Feeling well today with concerns for possible sleep apnea as he has been told that he stops breathing in his sleep and does snore on occasion.  Does have regular fatigue and can fall asleep at any given time.  He does wake up at night for urination but denies waking up having shortness of breath. Denies fevers, chills, night sweats, headaches, changes in vision, neck pain/stiffness, nausea, diarrhea, vomiting, lesions or rashes.  Mr. Guerreiro has no problems obtaining medication from the pharmacy.  Potentially getting a new insurance in the next couple months.  Denies feelings of being down, depressed, or hopeless recently.  Drinks alcohol on occasion with no recreational/illicit drug use and former tobacco use. Condoms offered. Healthcare maintenance due includes colonoscopy and routine dental care.    Allergies  Allergen Reactions   Bactrim [Sulfamethoxazole W/Trimethoprim (Co-Trimoxazole)] Hives and Shortness Of Breath   Sulfa Antibiotics Hives and Shortness Of Breath   Cymbalta [Duloxetine Hcl] Diarrhea   Duloxetine  Diarrhea   Other Hives and Swelling    [employer] toothpaste    Zoloft [Sertraline Hcl] Diarrhea   Clindamycin/Lincomycin Rash   Neurontin [Gabapentin] Rash      Outpatient Medications Prior to Visit  Medication Sig Dispense Refill   amLODipine (NORVASC) 10 MG tablet TAKE 1 TABLET(10 MG) BY MOUTH DAILY 30 tablet 0   bictegravir-emtricitabine-tenofovir AF (BIKTARVY) 50-200-25 MG TABS tablet TAKE 1 TABLET BY MOUTH 1 TIME A DAY. 30 tablet 4   sildenafil (VIAGRA) 100 MG tablet Take 0.5-1 tablets (50-100 mg total) by mouth daily as needed for erectile dysfunction. Do not exceed 1 tablet per day. 30 tablet 1   No facility-administered medications prior to visit.     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 Luciana Axe- sees him every 4 months   Hypertension    IBS (irritable bowel syndrome)    Migraine    "none in years" (02/16/2015   Overweight 07/20/2015   Renal insufficiency 12/29/2013     Past Surgical History:  Procedure Laterality Date   CO2 LASER APPLICATION N/A 02/11/2013   Procedure: CO2 LASER APPLICATION;  Surgeon: Romie Levee, MD;  Location: [facility];  Service:  General;  Laterality: N/A;   FOOT SURGERY     HIGH RESOLUTION ANOSCOPY N/A 02/11/2013   Procedure: HIGH RESOLUTION ANOSCOPY WITH BIOPSY, LASER ABLATION;  Surgeon: Romie Levee, MD;  Location: [facility];  Service: General;  Laterality: N/A;   WISDOM TOOTH EXTRACTION        Review of Systems  Constitutional:  Negative for appetite change, chills, fatigue, fever and unexpected weight change.  Eyes:  Negative for visual disturbance.  Respiratory:  Negative for cough, chest tightness, shortness of breath and wheezing.   Cardiovascular:  Negative  for chest pain and leg swelling.  Gastrointestinal:  Negative for abdominal pain, constipation, diarrhea, nausea and vomiting.  Genitourinary:  Negative for dysuria, flank pain, frequency, genital sores, hematuria and urgency.  Skin:  Negative for rash.  Allergic/Immunologic: Negative for immunocompromised state.  Neurological:  Negative for dizziness and headaches.      Objective:    BP 128/89   Pulse 79   Temp 98.2 F (36.8 C) (Oral)   Resp 16   Ht 6' (1.829 m)   Wt 209 lb 12.8 oz (95.2 kg)   SpO2 97%   BMI 28.45 kg/m  Nursing note and vital signs reviewed.  Physical Exam Constitutional:      General: He is not in acute distress.    Appearance: He is well-developed.  Eyes:     Conjunctiva/sclera: Conjunctivae normal.  Cardiovascular:     Rate and Rhythm: Normal rate and regular rhythm.     Heart sounds: Normal heart sounds. No murmur heard.    No friction rub. No gallop.  Pulmonary:     Effort: Pulmonary effort is normal. No respiratory distress.     Breath sounds: Normal breath sounds. No wheezing or rales.  Chest:     Chest wall: No tenderness.  Abdominal:     General: Bowel sounds are normal.     Palpations: Abdomen is soft.     Tenderness: There is no abdominal tenderness.  Musculoskeletal:     Cervical back: Neck supple.  Lymphadenopathy:     Cervical: No cervical adenopathy.  Skin:    General: Skin is warm and dry.     Findings: No rash.  Neurological:     Mental Status: He is alert and oriented to person, place, and time.  Psychiatric:        Behavior: Behavior normal.        Thought Content: Thought content normal.        Judgment: Judgment normal.         01/22/2022   11:16 AM 09/26/2021    9:23 AM 02/07/2021    8:52 AM 12/20/2020   10:07 AM 08/29/2020   10:28 AM  Depression screen PHQ 2/9  Decreased Interest 0 0 0 0 0  Down, Depressed, Hopeless 0 0 0 0 0  PHQ - 2 Score 0 0 0 0 0       Assessment & Plan:    Patient Active Problem List    Diagnosis Date Noted   Snoring 01/22/2022   Encounter for physical examination related to employment 09/26/2021   Human monkeypox 04/06/2021   Cervical pain 02/07/2021   Erectile dysfunction 08/29/2020   Healthcare maintenance 04/05/2020   Foot lesion 12/24/2019   Human immunodeficiency virus (HIV) disease (HCC) 08/25/2019   Gonorrhea 02/11/2019   Hepatitis B surface antigen positive 09/05/2018   Overweight 07/20/2015   Colitis 03/08/2015   HIV (human immunodeficiency virus infection) (HCC)    Tobacco  abuse    LGV (lymphogranuloma venereum)    Primary syphilis    Chronic hepatitis B (HCC) 10/03/2014   HTN (hypertension)    Gynecomastia 04/30/2014   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 No neurological or ophthalmologic signs/symptoms. Continue current dose of amlodipine.       Relevant Medications   amLODipine (NORVASC) 10 MG tablet   sildenafil (VIAGRA) 100 MG tablet     Digestive   Chronic hepatitis B (HCC) - Primary (Chronic)    Chronic hepatitis B appears stable with current dose of Biktarvy.  No signs of flare and currently asymptomatic.  Check lab work and obtain ultrasound for hepatocellular carcinoma screening.      Relevant Medications   bictegravir-emtricitabine-tenofovir AF (BIKTARVY) 50-200-25 MG TABS tablet   Other Relevant Orders   Hepatitis B DNA, ultraquantitative, PCR   Hepatitis B surface antigen   Hepatitis B core antibody, total   Hepatitis B e antibody   Hepatitis B e antigen   US ABDOMEN COMPLETE W/ELASTOGRAPHY   Liver Fibrosis, FibroTest-ActiTest     Other   Human immunodeficiency virus  (HIV) disease (HCC) (Chronic)    Mr. Favorite continues to have well-controlled virus with good adherence and tolerance to his ART regimen of Biktarvy.  Reviewed previous lab work and discussed plan of care.  Check blood work today.  Renew financial assistance.  Continue current dose of Biktarvy.  Plan for follow-up in 4 months or sooner if needed with lab work on the same day.      Relevant Medications   bictegravir-emtricitabine-tenofovir AF (BIKTARVY) 50-200-25 MG TABS tablet   Other Relevant Orders   Comprehensive metabolic panel   HIV-1 RNA quant-no reflex-bld   T-helper cell (CD4)- ([facility] clinic only)   Healthcare maintenance    Discussed importance of safe sexual practice and condom use. Condoms offered.  Vaccinations are up to date. Due for routine dental care. Referral placed to [facility].  Due for colonoscopy screening and will pursue financial assistance to complete testing.       Erectile dysfunction    Stable with current dose of sildenafil. No adverse side effects or priapism when using medication. Continue sildenafil as needed.       Snoring    Mr. Diveley has concerns for chronic snoring and fatigue with concern for possible sleep apnea. Requesting sleep study. Will be getting insurance in the next couple of months and can place referral when able.       Other Visit Diagnoses     Screening for STDs (sexually transmitted diseases)       Relevant Orders   Urine cytology ancillary only        I am having Cotton M. Searing maintain his amLODipine, Biktarvy, and sildenafil.   Meds ordered this encounter  Medications   amLODipine (NORVASC) 10 MG tablet    Sig: TAKE 1 TABLET(10 MG) BY MOUTH DAILY    Dispense:  30 tablet    Refill:  5    Order Specific Question:   Supervising Provider    Answer:   Drue Second, CYNTHIA [[id number]]   bictegravir-emtricitabine-tenofovir AF (BIKTARVY) 50-200-25 MG TABS tablet  Sig: TAKE 1 TABLET BY MOUTH 1 TIME A DAY.    Dispense:  30 tablet     Refill:  4    Order Specific Question:   Supervising Provider    Answer:   Drue Second, CYNTHIA [[id number]]   sildenafil (VIAGRA) 100 MG tablet    Sig: Take 0.5-1 tablets (50-100 mg total) by mouth daily as needed for erectile dysfunction. Do not exceed 1 tablet per day.    Dispense:  30 tablet    Refill:  1    Order Specific Question:   Supervising Provider    Answer:   Judyann Munson [[id number]]     Follow-up: Return in about 4 months (around 05/25/2022), or if symptoms worsen or fail to improve.   Marcos Eke, MSN, FNP-C Nurse Practitioner [facility] Main number: [phone]

## 2022-01-22 NOTE — Assessment & Plan Note (Signed)
Chronic hepatitis B appears stable with current dose of Biktarvy.  No signs of flare and currently asymptomatic.  Check lab work and obtain ultrasound for hepatocellular carcinoma screening.

## 2022-01-22 NOTE — Patient Instructions (Addendum)
Nice to see you.  We will check your lab work today.  Continue to take your medication daily as prescribed.  Refills have been sent to the pharmacy.  Plan for follow up in 4 months or sooner if needed with lab work on the same day.  Have a great day and stay safe!  

## 2022-01-22 NOTE — Assessment & Plan Note (Signed)
Blood pressure well controlled with current dose of amlodipine with no adverse side effects. No neurological or ophthalmologic signs/symptoms. Continue current dose of amlodipine.

## 2022-01-22 NOTE — Assessment & Plan Note (Signed)
Phillip Gill has concerns for chronic snoring and fatigue with concern for possible sleep apnea. Requesting sleep study. Will be getting insurance in the next couple of months and can place referral when able.

## 2022-01-23 LAB — T-HELPER CELL (CD4) - (RCID CLINIC ONLY)
CD4 % Helper T Cell: 37 % (ref 33–65)
CD4 T Cell Abs: 1181 /uL (ref 400–1790)

## 2022-01-23 LAB — URINE CYTOLOGY ANCILLARY ONLY
Chlamydia: NEGATIVE
Comment: NEGATIVE
Comment: NORMAL
Neisseria Gonorrhea: NEGATIVE

## 2022-01-27 LAB — COMPREHENSIVE METABOLIC PANEL
AG Ratio: 1.1 (calc) (ref 1.0–2.5)
ALT: 20 U/L (ref 9–46)
AST: 22 U/L (ref 10–40)
Albumin: 4.3 g/dL (ref 3.6–5.1)
Alkaline phosphatase (APISO): 77 U/L (ref 36–130)
BUN: 13 mg/dL (ref 7–25)
CO2: 26 mmol/L (ref 20–32)
Calcium: 9.3 mg/dL (ref 8.6–10.3)
Chloride: 103 mmol/L (ref 98–110)
Creat: 1 mg/dL (ref 0.60–1.29)
Globulin: 3.8 g/dL (calc) — ABNORMAL HIGH (ref 1.9–3.7)
Glucose, Bld: 88 mg/dL (ref 65–99)
Potassium: 3.8 mmol/L (ref 3.5–5.3)
Sodium: 142 mmol/L (ref 135–146)
Total Bilirubin: 0.5 mg/dL (ref 0.2–1.2)
Total Protein: 8.1 g/dL (ref 6.1–8.1)

## 2022-01-27 LAB — LIVER FIBROSIS, FIBROTEST-ACTITEST
ALT: 20 U/L (ref 9–46)
Alpha-2-Macroglobulin: 178 mg/dL (ref 106–279)
Apolipoprotein A1: 152 mg/dL (ref 94–176)
Bilirubin: 0.4 mg/dL (ref 0.2–1.2)
Fibrosis Score: 0.13
GGT: 44 U/L (ref 3–95)
Haptoglobin: 204 mg/dL (ref 43–212)
Necroinflammat ACT Score: 0.06
Reference ID: 4475547

## 2022-01-27 LAB — HEPATITIS B DNA, ULTRAQUANTITATIVE, PCR
Hepatitis B DNA: NOT DETECTED IU/mL
Hepatitis B virus DNA: NOT DETECTED Log IU/mL

## 2022-01-27 LAB — HIV-1 RNA QUANT-NO REFLEX-BLD
HIV 1 RNA Quant: 45 Copies/mL — ABNORMAL HIGH
HIV-1 RNA Quant, Log: 1.65 Log cps/mL — ABNORMAL HIGH

## 2022-01-27 LAB — HEPATITIS B E ANTIBODY: Hep B E Ab: NONREACTIVE

## 2022-01-27 LAB — HEPATITIS B CORE ANTIBODY, TOTAL: Hep B Core Total Ab: REACTIVE — AB

## 2022-01-27 LAB — HEPATITIS B E ANTIGEN: Hep B E Ag: NONREACTIVE

## 2022-01-27 LAB — HEPATITIS B SURFACE ANTIGEN: Hepatitis B Surface Ag: REACTIVE — AB

## 2022-02-02 ENCOUNTER — Other Ambulatory Visit: Payer: Self-pay

## 2022-02-21 ENCOUNTER — Other Ambulatory Visit: Payer: Self-pay

## 2022-02-21 ENCOUNTER — Emergency Department (HOSPITAL_COMMUNITY)
Admission: EM | Admit: 2022-02-21 | Discharge: 2022-02-21 | Disposition: A | Discharge status: Home / Self Care | Payer: Self-pay | Attending: Emergency Medicine | Admitting: Emergency Medicine

## 2022-02-21 ENCOUNTER — Encounter (HOSPITAL_COMMUNITY): Payer: Self-pay

## 2022-02-21 ENCOUNTER — Emergency Department (HOSPITAL_COMMUNITY): Payer: Self-pay

## 2022-02-21 DIAGNOSIS — Z21 Asymptomatic human immunodeficiency virus [HIV] infection status: Secondary | ICD-10-CM | POA: Insufficient documentation

## 2022-02-21 DIAGNOSIS — R519 Headache, unspecified: Secondary | ICD-10-CM | POA: Insufficient documentation

## 2022-02-21 DIAGNOSIS — R42 Dizziness and giddiness: Secondary | ICD-10-CM | POA: Insufficient documentation

## 2022-02-21 LAB — BASIC METABOLIC PANEL
Anion gap: 7 (ref 5–15)
BUN: 15 mg/dL (ref 6–20)
CO2: 24 mmol/L (ref 22–32)
Calcium: 8.9 mg/dL (ref 8.9–10.3)
Chloride: 107 mmol/L (ref 98–111)
Creatinine, Ser: 1.01 mg/dL (ref 0.61–1.24)
GFR, Estimated: >60 mL/min (ref >60)
Glucose, Bld: 105 mg/dL — ABNORMAL HIGH (ref 70–99)
Potassium: 4 mmol/L (ref 3.5–5.1)
Sodium: 138 mmol/L (ref 135–145)

## 2022-02-21 LAB — CBC
HCT: 43.1 % (ref 39.0–52.0)
Hemoglobin: 14 g/dL (ref 13.0–17.0)
MCH: 29.1 pg (ref 26.0–34.0)
MCHC: 32.5 g/dL (ref 30.0–36.0)
MCV: 89.6 fL (ref 80.0–100.0)
Platelets: 255 10*3/uL (ref 150–400)
RBC: 4.81 MIL/uL (ref 4.22–5.81)
RDW: 13.4 % (ref 11.5–15.5)
WBC: 10.1 10*3/uL (ref 4.0–10.5)
nRBC: 0 % (ref 0.0–0.2)

## 2022-02-21 LAB — CBG MONITORING, ED: Glucose-Capillary: 98 mg/dL (ref 70–99)

## 2022-02-21 LAB — URINALYSIS, ROUTINE W REFLEX MICROSCOPIC
Bilirubin Urine: NEGATIVE
Glucose, UA: NEGATIVE mg/dL
Ketones, ur: NEGATIVE mg/dL
Leukocytes,Ua: NEGATIVE
Nitrite: NEGATIVE
Protein, ur: NEGATIVE mg/dL
Specific Gravity, Urine: 1.013 (ref 1.005–1.030)
pH: 5 (ref 5.0–8.0)

## 2022-02-21 MED ORDER — PROCHLORPERAZINE MALEATE 10 MG PO TABS: 10.0000 mg | ORAL_TABLET | Freq: Two times a day (BID) | ORAL | 0 refills | Status: DC | PRN

## 2022-02-21 MED ORDER — KETOROLAC TROMETHAMINE 15 MG/ML IJ SOLN
15.0000 mg | Freq: Once | INTRAMUSCULAR | Status: AC
  Administered 2022-02-21: 15 mg via INTRAMUSCULAR
  Filled 2022-02-21: qty 1

## 2022-02-21 NOTE — Discharge Instructions (Addendum)
You were seen today for headache. We did not identify any emergent cause for your symptoms.   Plan and next steps:   The following may be helpful in managing your symptoms:   Pain- Lidocaine Patches  Apply to affected area for up to 12 hours at a time.   Pain/Fever- Adult Tylenol dosing:  650 mg orally every 4 to 6 hours as needed, MAX: 3250 mg/24 hours   (Extra-strength) 1000 mg orally every 6 hours as needed; MAX: 3000 mg/24 hours   Do not use if you have liver disease. Read the label on the bottle.   Pain/Fever- Adult Ibuprofen Dosing  200 to 400 mg orally every 4 to 6 hours as needed; MAX 1200 mg/day; do not take longer than 10 days   Do not use if you have kidney disease. Read the label on the bottle   Additionally, we have prescribed you Compazine to assist with your symptoms.  Please take it after you have gotten home.   Findings:  You may see all of your lab and imaging results utilizing our online portal! Look in this document or ask a team member for your mychart* access information. The most notable results have additionally been verbally communicated with you and your bedside family.    Follow-up Plan:   Follow up with the patient's normal primary care provider for monitoring of this condition within 48 hours.   Signs/Symptoms that would warrant return to the ED:  Please return to the ED if you experience worsening of symptoms or any abrupt changes in your health. Standard of care precautions for your chief complaint have already been verbally communicated with you. Always be on alert for fevers, chills, shortness of breath, chest pains, or sudden changes that warrant immediate evaluation.    Thank you for allowing [place] to be a part of you and your families' care.   Glyn Ade MD

## 2022-02-21 NOTE — ED Triage Notes (Signed)
Pt reports feeling lightheaded, dizzy, and nauseous yesterday. Today he work up with a severe headache.

## 2022-02-21 NOTE — ED Provider Triage Note (Signed)
Emergency Medicine Provider Triage Evaluation Note  Phillip Gill , a 46 y.o. male  was evaluated in triage.  Pt complains of headache.  Patient states that yesterday he was feeling dizzy throughout the day.  He states he identified woke up with the worst headache of his life.  He states the headache is "all over" but worse in the back of his head.  He endorses nausea but no vomiting at this time.  He states he has had bad headaches in the past, possibly migraines, but is never had a headache this severe.  He denies any injury or trauma to his head  Review of Systems  Positive: Headache, nausea Negative: Trauma, chest pain, shortness of breath  Physical Exam  BP (!) 132/97 (BP Location: Left Arm)   Pulse 82   Temp 98.3 F (36.8 C) (Oral)   Resp 14   SpO2 97%  Gen:   Awake, no distress   Resp:  Normal effort  MSK:   Moves extremities without difficulty  Other:    Medical Decision Making  Medically screening exam initiated at 7:20 AM.  Appropriate orders placed.  Phillip Gill was informed that the remainder of the evaluation will be completed by another provider, this initial triage assessment does not replace that evaluation, and the importance of remaining in the ED until their evaluation is complete.     Darrick Grinder, [place] 02/21/22 [phone]

## 2022-02-21 NOTE — ED Provider Notes (Addendum)
[facility]-EMERGENCY DEPT Provider Note   CSN: [id number] Arrival date & time: 02/21/22  2706     History Chief Complaint  Patient presents with   Dizziness   Headache    HPI Phillip Gill is a 46 y.o. male presenting for headache.  Patient endorses daily headaches over the last 4 days intermittent in nature.  He states they are worse in the morning and reach a peak intensity of 8.5 out of 10.  He has a history of HIV but denies fevers or chills nausea vomiting syncope shortness of breath.  He is compliant with all of his medications provided through the [facility] program.  He is ambulatory tolerating p.o. intake.  He states his headache is already subsiding for the morning.  Patient's recorded medical, surgical, social, medication list and allergies were reviewed in the Snapshot window as part of the initial history.   Review of Systems   Review of Systems  Constitutional:  Negative for chills and fever.  HENT:  Negative for ear pain and sore throat.   Eyes:  Negative for pain and visual disturbance.  Respiratory:  Negative for cough and shortness of breath.   Cardiovascular:  Negative for chest pain and palpitations.  Gastrointestinal:  Negative for abdominal pain and vomiting.  Genitourinary:  Negative for dysuria and hematuria.  Musculoskeletal:  Negative for arthralgias and back pain.  Skin:  Negative for color change and rash.  Neurological:  Positive for headaches. Negative for seizures and syncope.  All other systems reviewed and are negative.   Physical Exam Updated Vital Signs BP (!) 132/97 (BP Location: Left Arm)   Pulse 82   Temp 98.3 F (36.8 C) (Oral)   Resp 14   SpO2 97%  Physical Exam Vitals and nursing note reviewed.  Constitutional:      General: He is not in acute distress.    Appearance: He is well-developed.  HENT:     Head: Normocephalic and atraumatic.  Eyes:     Conjunctiva/sclera: Conjunctivae normal.   Cardiovascular:     Rate and Rhythm: Normal rate and regular rhythm.     Heart sounds: No murmur heard. Pulmonary:     Effort: Pulmonary effort is normal. No respiratory distress.     Breath sounds: Normal breath sounds.  Abdominal:     Palpations: Abdomen is soft.     Tenderness: There is no abdominal tenderness.  Musculoskeletal:        General: No swelling.     Cervical back: Neck supple.  Skin:    General: Skin is warm and dry.     Capillary Refill: Capillary refill takes less than 2 seconds.  Neurological:     Mental Status: He is alert.  Psychiatric:        Mood and Affect: Mood normal.      ED Course/ Medical Decision Making/ A&P    Procedures Procedures   Medications Ordered in ED Medications  ketorolac (TORADOL) 15 MG/ML injection 15 mg (15 mg Intramuscular Given 02/21/22 1010)   Medical Decision Making:   Phillip Gill is a 46 y.o. male who presented to the ED today with headache detailed above.    Patient's presentation is complicated by their history of HIV on outpatient Biktarvy.  Patient placed on continuous vitals and telemetry monitoring while in ED which was reviewed periodically.   Complete initial physical exam performed, notably the patient  was hemodynamically stable in no acute distress.  He has no meningismus,  he is ambulatory tolerating p.o. intake.  He is afebrile and has no focal neurologic deficits on extensive exam.    Reviewed and confirmed nursing documentation for past medical history, family history, social history.    Initial Assessment:   With the patient's presentation of headache, most likely diagnosis is tension type headaches vs atypical migraines. Other diagnoses were considered including (but not limited to) intracranial mass, intracranial hemorrhage, intracranial infection including meningitis vs encephalitis, GCA, trigeminal neuralgia. These are considered less likely due to history of present illness and physical exam findings.     This is most consistent with an acute life/limb threatening illness complicated by underlying chronic conditions.  Timeline and slow onset is not consistent with SAH/ICH, in addition, 4 days of recurrent headaches are less consistent with intracranial hemorrhage.  Age and description of pain is not consistent with GCA  Lack of fever,meningismus is not consistent with meningitis/encephalitis   Initial Plan:  Recommended ED observation and treatment of patient's headache with Compazine, Benadryl and plan for reassessment to ensure symptomatic improvement.  However patient stated that he has been in the ER too long this morning and his symptoms already improving.  He is requesting discharge and medications p.o. Screening labs including CBC and Metabolic panel to evaluate for infectious or metabolic etiology of disease.  Urinalysis with reflex culture ordered to evaluate for UTI or relevant urologic/nephrologic pathology.  CTH to evaluate for structural IC etiology  Objective evaluation as below reviewed   Initial Study Results:   Laboratory  All laboratory results reviewed without evidence of clinically relevant pathology.     EKG EKG was reviewed independently. Rate, rhythm, axis, intervals all examined and without medically relevant abnormality. ST segments without concerns for elevations.    Radiology:  All images reviewed independently. Agree with radiology report at this time.   CT Head Wo Contrast  Final Result      Final Assessment and Plan:   Patient's history of present illness and physical exam findings are most consistent with nonspecific headache.  I recommended ED observation and symptomatic management for reassessment and ongoing care and management, however patient is requesting discharge.  Discussed risk of missed intracranial hemorrhage, alternative life-threatening etiology given lack of ability to evaluate for symptomatic improvement.  Patient again declined further ED  observation.  Given as patient is ambulatory tolerating p.o. intake, this is reasonable.  Strict return precautions reinforced and medications prescribed to treat patient's headache in the ambulatory setting.  Importance of follow-up with PCP reinforced and patient expressed understanding.  Patient discharged with no further acute events.  Clinical Impression:  1. Nonintractable headache, unspecified chronicity pattern, unspecified headache type      Data Unavailable   Final Clinical Impression(s) / ED Diagnoses Final diagnoses:  Nonintractable headache, unspecified chronicity pattern, unspecified headache type    Rx / DC Orders ED Discharge Orders          Ordered    prochlorperazine (COMPAZINE) 10 MG tablet  2 times daily PRN        02/21/22 1013              Glyn Ade, MD 02/21/22 1015    Glyn Ade, MD 02/21/22 1015

## 2022-03-14 ENCOUNTER — Telehealth: Payer: Self-pay

## 2022-03-14 DIAGNOSIS — R0683 Snoring: Secondary | ICD-10-CM

## 2022-03-14 NOTE — Telephone Encounter (Signed)
Patient called requesting referral for sleep study. Patient now has insurance and it has been updated in chart.    Phillip Gill, CMA

## 2022-03-20 NOTE — Addendum Note (Signed)
Addended by: Mauricio Po D on: 03/20/2022 02:02 PM   Modules accepted: Orders

## 2022-03-20 NOTE — Telephone Encounter (Signed)
Done

## 2022-03-26 ENCOUNTER — Ambulatory Visit (HOSPITAL_COMMUNITY)
Admission: EM | Admit: 2022-03-26 | Discharge: 2022-03-26 | Disposition: A | Discharge status: Home / Self Care | Payer: Managed Care, Other (non HMO) | Attending: Emergency Medicine | Admitting: Emergency Medicine

## 2022-03-26 ENCOUNTER — Encounter (HOSPITAL_COMMUNITY): Payer: Self-pay | Admitting: Emergency Medicine

## 2022-03-26 DIAGNOSIS — M25511 Pain in right shoulder: Secondary | ICD-10-CM

## 2022-03-26 DIAGNOSIS — S90521A Blister (nonthermal), right ankle, initial encounter: Secondary | ICD-10-CM

## 2022-03-26 MED ORDER — CYCLOBENZAPRINE HCL 10 MG PO TABS: 10.0000 mg | ORAL_TABLET | Freq: Two times a day (BID) | ORAL | 0 refills | Status: DC | PRN

## 2022-03-26 MED ORDER — CEPHALEXIN 500 MG PO CAPS: 500.0000 mg | ORAL_CAPSULE | Freq: Two times a day (BID) | ORAL | 0 refills | Status: AC

## 2022-03-26 MED ORDER — PREDNISONE 20 MG PO TABS: 40.0000 mg | ORAL_TABLET | Freq: Every day | ORAL | 0 refills | Status: DC

## 2022-03-26 NOTE — Discharge Instructions (Signed)
For your shoulder -As there was no direct injury your bone should be intact and therefore we did not complete an x-ray today however I do believe there is a possible injury to your rotator cuff and we will help to minimize pain, definitive test for rotator cuff injury is at higher level imaging such as a CT which we do not have available here in urgent care -Take prednisone every morning with food for 5 days, this medicine helps to reduce inflammation that occurs with injury and ideally will reduce your pain, you may take Tylenol in addition to this medicine -You may use muscle relaxer twice daily as needed for additional comfort, be mindful this medication may make you drowsy -You may use heating pad in 15 minute intervals as needed for additional comfort, or you may find comfort in using ice in 10-15 minutes over affected area -Begin stretching affected area daily for 10 minutes as tolerated to further loosen muscles  -When lying down place pillow underneath arm and behind shoulder -If pain persist after recommended treatment or reoccurs if may be beneficial to follow up with orthopedic specialist for evaluation, this doctor specializes in the bones and can manage your symptoms long-term with options such as but not limited to imaging, medications or physical therapy    For your blister -At this time there are no signs of infection, only clear fluid which is normal was drained from the cyst and there is no signs of pus, your blisters most likely caused from friction -As we have opened the skin we will provide antibiotics to keep the area from becoming infected, take Keflex every morning and every evening for 5 days -Ensure that you are wearing socks and that there is a padding between the foot and shoe to prevent further irritation -Purchase over-the-counter callus, nurse typically found in the foot care section for additional protection -May follow-up with urgent care as needed for concerns  regarding healing

## 2022-03-26 NOTE — ED Provider Notes (Addendum)
[facility]    CSN: [id number] Arrival date & time: 03/26/22  1443      History   Chief Complaint Chief Complaint  Patient presents with   Blister   Shoulder Pain    HPI Phillip Gill is a 46 y.o. male.   Patient presents with right-sided shoulder pain occurring for 3 weeks.  Denies precipitating event, trauma or injury but does endorse that he has to pull and lift for work.  Pain is worsened when reaching outwards and when arm is raised above head.  Associated intermittent tingling sensation which radiates down the arm . Has Attempted use of over-the-counter medicine.   Concerned with blister to the left ankle that was first noticed 5 days ago.  Increased in size, initially mildly pruritic now has a pins and needle sensation.  Has attempted to pop site but was ineffective.  Denies fever, chills or drainage.  Attempted use of an antibiotic ointment that was found at work.    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 Linus Salmons- sees him every 4 months   Hypertension    IBS (irritable bowel syndrome)    Migraine    "none in years" (02/16/2015   Overweight 07/20/2015   Renal insufficiency 12/29/2013    Patient Active Problem List   Diagnosis Date Noted   Snoring 01/22/2022   Encounter for physical examination related to employment 09/26/2021   Human monkeypox 04/06/2021   Cervical pain 02/07/2021   Erectile dysfunction 08/29/2020   Healthcare maintenance 04/05/2020   Foot lesion 12/24/2019   Human immunodeficiency virus (HIV) disease ([place]) 08/25/2019   Gonorrhea 02/11/2019   Hepatitis B surface antigen positive 09/05/2018   Overweight 07/20/2015   Colitis 03/08/2015   HIV (human immunodeficiency virus infection) ([place])     Tobacco abuse    LGV (lymphogranuloma venereum)    Primary syphilis    Chronic hepatitis B ([place]) 10/03/2014   HTN (hypertension)    Gynecomastia 04/30/2014   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   Chronic pain associated with significant psychosocial dysfunction 09/18/2012   Fibromyalgia muscle pain 02/11/2012   Depression 12/07/2011   Anxiety 02/02/2011    Past Surgical History:  Procedure Laterality Date   CO2 LASER APPLICATION N/A 15/40/0867   Procedure: CO2 LASER APPLICATION;  Surgeon: Leighton Ruff, MD;  Location: [facility];  Service: General;  Laterality: N/A;   FOOT SURGERY     HIGH RESOLUTION ANOSCOPY N/A 02/11/2013   Procedure: HIGH RESOLUTION ANOSCOPY WITH BIOPSY, LASER ABLATION;  Surgeon: Leighton Ruff, MD;  Location: [facility];  Service: General;  Laterality: N/A;   [facility] Medications    Prior to Admission medications   Medication Sig Start Date End Date Taking? Authorizing Provider  amLODipine (NORVASC) 10 MG tablet TAKE 1 TABLET(10 MG) BY MOUTH DAILY 01/22/22   Golden Circle, FNP  bictegravir-emtricitabine-tenofovir AF (BIKTARVY) 50-200-25 MG TABS tablet TAKE 1 TABLET BY MOUTH 1 TIME A DAY. 01/22/22   Golden Circle, FNP  prochlorperazine (COMPAZINE) 10 MG tablet Take 1 tablet (10 mg total) by mouth 2 (two) times  daily as needed for up to 2 days for nausea. 02/21/22 02/23/22  Glyn Ade, MD  sildenafil (VIAGRA) 100 MG tablet Take 0.5-1 tablets (50-100 mg total) by mouth daily as needed for erectile dysfunction. Do not exceed 1 tablet per day. 01/22/22   Veryl Speak, FNP    Family History Family History  Problem Relation Age of Onset   Hypertension Mother    Diabetes Mother    Stroke Mother        cerbral aneurysm   Hypertension Brother    Mental illness Neg Hx     Social History Social History   Tobacco Use   Smoking status: Former    Packs/day: 1.00    Years: 18.00    Total pack  years: 18.00    Types: Cigarettes    Quit date: 07/02/2021    Years since quitting: 0.7   Smokeless tobacco: Never  Vaping Use   Vaping Use: Some days   Substances: Nicotine  Substance Use Topics   Alcohol use: Yes    Comment: occ   Drug use: No     Allergies   Bactrim [sulfamethoxazole w/trimethoprim (co-trimoxazole)], Sulfa antibiotics, Cymbalta [duloxetine hcl], Duloxetine, Other, Zoloft [sertraline hcl], Clindamycin/lincomycin, and Neurontin [gabapentin]   Review of Systems Review of Systems Defer to HPI    Physical Exam Triage Vital Signs ED Triage Vitals  Enc Vitals Group     BP 03/26/22 0825 125/88     Pulse Rate 03/26/22 0825 75     Resp 03/26/22 0825 17     Temp 03/26/22 0825 98 F (36.7 C)     Temp Source 03/26/22 0825 Oral     SpO2 03/26/22 0825 95 %     Weight --      Height --      Head Circumference --      Peak Flow --      Pain Score 03/26/22 0824 5     Pain Loc --      Pain Edu? --      Excl. in GC? --    No data found.  Updated Vital Signs BP 125/88 (BP Location: Left Arm)   Pulse 75   Temp 98 F (36.7 C) (Oral)   Resp 17   SpO2 95%   Visual Acuity Right Eye Distance:   Left Eye Distance:   Bilateral Distance:    Right Eye Near:   Left Eye Near:    Bilateral Near:     Physical Exam Constitutional:      Appearance: Normal appearance.  Eyes:     Extraocular Movements: Extraocular movements intact.  Pulmonary:     Effort: Pulmonary effort is normal.  Musculoskeletal:     Comments: Tenderness is present over the superior anterior of the right shoulder without ecchymosis, swelling or deformity, range of motion is intact but pain is elicited when arms extended above head, 2+ carotid and brachial pulse, strength is a 5 out of 5, negative Hawkins  Skin:    General: Skin is warm and dry.     Comments: 0.5 cm circular blister present to the medial aspect of the right ankle, nontender, nondraining  Neurological:     Mental Status: He is  alert and oriented to person, place, and time. Mental status is at baseline.  Psychiatric:        Mood and Affect: Mood normal.        Behavior: Behavior normal.      UC Treatments / Results  Labs (all labs ordered are listed, but only abnormal results are displayed) Labs Reviewed - No data to display  EKG   Radiology No results found.  Procedures Procedures (including critical care time)  Medications Ordered in UC Medications - No data to display  Initial Impression / Assessment and Plan / UC Course  I have reviewed the triage vital signs and the nursing notes.  Pertinent labs & imaging results that were available during my care of the patient were reviewed by me and considered in my medical decision making (see chart for details).  Acute pain of right shoulder pain, blister of the right ankle, initial encounter  We will defer imaging as there was no direct injury low suspicion of bone involvement, discussed with patient, possible injury to the rotator cuff we will start conservative management, prescribed prednisone and Flexeril for outpatient use and recommended RICE, heat, daily stretching, massage and activity as tolerated, given walking referral to orthopedics if symptoms persist or worsen  Puncture and aspirate completed lidocaine topical spray and a 18 G needle,  serous fluid expelled, no signs of infection, placed on Keflex prophylactically as skin has been opened, recommended cleansing daily with diluted soapy water, pat dry and cover with bandage until skin has closed, etiology is most likely friction, discussed with patient and recommended using callus pads over the site and wearing thick padded socks to prevent further irritation, may follow-up with urgent care for concern about a nonhealing site Final Clinical Impressions(s) / UC Diagnoses   Final diagnoses:  None   Discharge Instructions   None    ED Prescriptions   None    PDMP not reviewed this  encounter.   Valinda Hoar, NP 03/26/22 1628    Valinda Hoar, NP 03/30/22 [phone]

## 2022-03-26 NOTE — ED Triage Notes (Signed)
Pt c/o blister on right medial ankle of foot that started out itching. Is little painful.   Pt also has rubbing in right shoulder for 3 weeks. Worse with moving and raising of arm. Denies any injury or falls.

## 2022-03-29 ENCOUNTER — Ambulatory Visit (HOSPITAL_COMMUNITY)
Admission: RE | Admit: 2022-03-29 | Discharge: 2022-03-29 | Disposition: A | Discharge status: Home / Self Care | Payer: Managed Care, Other (non HMO) | Source: Ambulatory Visit | Attending: Family | Admitting: Family

## 2022-03-29 DIAGNOSIS — B181 Chronic viral hepatitis B without delta-agent: Secondary | ICD-10-CM | POA: Insufficient documentation

## 2022-04-18 ENCOUNTER — Ambulatory Visit (INDEPENDENT_AMBULATORY_CARE_PROVIDER_SITE_OTHER): Payer: Managed Care, Other (non HMO) | Admitting: Neurology

## 2022-04-18 ENCOUNTER — Encounter: Payer: Self-pay | Admitting: Neurology

## 2022-04-18 VITALS — BP 128/88 | HR 90 | Ht 72.0 in | Wt 215.8 lb

## 2022-04-18 DIAGNOSIS — Z820 Family history of epilepsy and other diseases of the nervous system: Secondary | ICD-10-CM

## 2022-04-18 DIAGNOSIS — R400 Somnolence: Secondary | ICD-10-CM

## 2022-04-18 DIAGNOSIS — G4730 Sleep apnea, unspecified: Secondary | ICD-10-CM

## 2022-04-18 DIAGNOSIS — R351 Nocturia: Secondary | ICD-10-CM

## 2022-04-18 DIAGNOSIS — R0683 Snoring: Secondary | ICD-10-CM | POA: Diagnosis not present

## 2022-04-18 DIAGNOSIS — E663 Overweight: Secondary | ICD-10-CM

## 2022-04-18 DIAGNOSIS — R519 Headache, unspecified: Secondary | ICD-10-CM

## 2022-04-18 DIAGNOSIS — R0681 Apnea, not elsewhere classified: Secondary | ICD-10-CM
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
## 2022-04-18 NOTE — Patient Instructions (Signed)

## 2022-04-20 ENCOUNTER — Telehealth: Payer: Self-pay

## 2022-04-20 DIAGNOSIS — B20 Human immunodeficiency virus [HIV] disease: Secondary | ICD-10-CM

## 2022-04-20 NOTE — Telephone Encounter (Signed)
PA initiated for patient Biktarvy.  Awaiting response.

## 2022-04-23 ENCOUNTER — Other Ambulatory Visit (HOSPITAL_COMMUNITY): Payer: Self-pay

## 2022-04-23 MED ORDER — BIKTARVY 50-200-25 MG PO TABS: ORAL_TABLET | ORAL | 1 refills | Status: DC

## 2022-04-23 NOTE — Telephone Encounter (Signed)
Received fax from [facility] stating PA is not required for Biktarvy. Patient will need medication filled through [facility]. Will send new prescription to preferred pharmacy. Patient updated.  [facility] pharmacy P: [place], RMA

## 2022-04-23 NOTE — Addendum Note (Signed)
Addended by: Leatrice Jewels on: 04/23/2022 03:17 PM   Modules accepted: Orders

## 2022-05-08 ENCOUNTER — Encounter: Payer: Self-pay | Admitting: Neurology

## 2022-05-08 ENCOUNTER — Telehealth: Payer: Self-pay | Admitting: Neurology

## 2022-05-08 NOTE — Telephone Encounter (Signed)
Patient returned my call.  HST- [employer] no auth req ref # [id number] pt chose   He is scheduled at [facility] for 05/29/22 at 3 pm.  Mailed packet to the patient.  

## 2022-05-08 NOTE — Telephone Encounter (Signed)
Patient returned my call.  HST- [employer] no Roxanna Mew ref # [id number] pt chose   He is scheduled at [facility] for 05/29/22 at 3 pm.  Mailed packet to the patient.

## 2022-05-08 NOTE — Telephone Encounter (Signed)
lvm for pt to call back to schedule   [employer] no auth req ref # [id number]

## 2022-05-14 ENCOUNTER — Other Ambulatory Visit (HOSPITAL_COMMUNITY)
Admission: RE | Admit: 2022-05-14 | Discharge: 2022-05-14 | Disposition: A | Discharge status: Home / Self Care | Payer: Managed Care, Other (non HMO) | Source: Ambulatory Visit | Attending: Family | Admitting: Family

## 2022-05-14 ENCOUNTER — Other Ambulatory Visit: Payer: Self-pay

## 2022-05-14 ENCOUNTER — Other Ambulatory Visit: Payer: Managed Care, Other (non HMO)

## 2022-05-14 DIAGNOSIS — B20 Human immunodeficiency virus [HIV] disease: Secondary | ICD-10-CM | POA: Diagnosis present

## 2022-05-14 DIAGNOSIS — Z113 Encounter for screening for infections with a predominantly sexual mode of transmission: Secondary | ICD-10-CM

## 2022-05-14 DIAGNOSIS — Z79899 Other long term (current) drug therapy: Secondary | ICD-10-CM

## 2022-05-16 LAB — CBC WITH DIFFERENTIAL/PLATELET
Absolute Monocytes: 732 cells/uL (ref 200–950)
Basophils Absolute: 29 cells/uL (ref 0–200)
Basophils Relative: 0.3 %
Eosinophils Absolute: 57 cells/uL (ref 15–500)
Eosinophils Relative: 0.6 %
HCT: 39.8 % (ref 38.5–50.0)
Hemoglobin: 13.7 g/dL (ref 13.2–17.1)
Lymphs Abs: 3791 cells/uL (ref 850–3900)
MCH: 30.4 pg (ref 27.0–33.0)
MCHC: 34.4 g/dL (ref 32.0–36.0)
MCV: 88.4 fL (ref 80.0–100.0)
MPV: 11 fL (ref 7.5–12.5)
Monocytes Relative: 7.7 %
Neutro Abs: 4893 cells/uL (ref 1500–7800)
Neutrophils Relative %: 51.5 %
Platelets: 252 10*3/uL (ref 140–400)
RBC: 4.5 10*6/uL (ref 4.20–5.80)
RDW: 13.1 % (ref 11.0–15.0)
Total Lymphocyte: 39.9 %
WBC: 9.5 10*3/uL (ref 3.8–10.8)

## 2022-05-16 LAB — LIPID PANEL
Cholesterol: 192 mg/dL (ref <200)
HDL: 38 mg/dL — ABNORMAL LOW (ref >=40)
LDL Cholesterol (Calc): 106 mg/dL (calc) — ABNORMAL HIGH
Non-HDL Cholesterol (Calc): 154 mg/dL (calc) — ABNORMAL HIGH (ref <130)
Total CHOL/HDL Ratio: 5.1 (calc) — ABNORMAL HIGH (ref <5.0)
Triglycerides: 363 mg/dL — ABNORMAL HIGH (ref <150)

## 2022-05-16 LAB — COMPLETE METABOLIC PANEL WITH GFR
AG Ratio: 1.2 (calc) (ref 1.0–2.5)
ALT: 23 U/L (ref 9–46)
AST: 23 U/L (ref 10–40)
Albumin: 4.3 g/dL (ref 3.6–5.1)
Alkaline phosphatase (APISO): 84 U/L (ref 36–130)
BUN/Creatinine Ratio: 11 (calc) (ref 6–22)
BUN: 15 mg/dL (ref 7–25)
CO2: 28 mmol/L (ref 20–32)
Calcium: 9.2 mg/dL (ref 8.6–10.3)
Chloride: 102 mmol/L (ref 98–110)
Creat: 1.4 mg/dL — ABNORMAL HIGH (ref 0.60–1.29)
Globulin: 3.5 g/dL (calc) (ref 1.9–3.7)
Glucose, Bld: 102 mg/dL — ABNORMAL HIGH (ref 65–99)
Potassium: 4 mmol/L (ref 3.5–5.3)
Sodium: 138 mmol/L (ref 135–146)
Total Bilirubin: 0.3 mg/dL (ref 0.2–1.2)
Total Protein: 7.8 g/dL (ref 6.1–8.1)
eGFR: 63 mL/min/{1.73_m2} (ref >=60)

## 2022-05-16 LAB — T-HELPER CELL (CD4) - (RCID CLINIC ONLY)
CD4 % Helper T Cell: 38 % (ref 33–65)
CD4 T Cell Abs: 1364 /uL (ref 400–1790)

## 2022-05-16 LAB — HIV-1 RNA QUANT-NO REFLEX-BLD
HIV 1 RNA Quant: 39 Copies/mL — ABNORMAL HIGH
HIV-1 RNA Quant, Log: 1.59 Log cps/mL — ABNORMAL HIGH

## 2022-05-16 LAB — URINE CYTOLOGY ANCILLARY ONLY
Chlamydia: NEGATIVE
Comment: NEGATIVE
Comment: NORMAL
Neisseria Gonorrhea: NEGATIVE

## 2022-05-16 LAB — RPR: RPR Ser Ql: NONREACTIVE

## 2022-05-28 ENCOUNTER — Other Ambulatory Visit (HOSPITAL_COMMUNITY): Payer: Self-pay

## 2022-05-28 ENCOUNTER — Ambulatory Visit (INDEPENDENT_AMBULATORY_CARE_PROVIDER_SITE_OTHER): Payer: Managed Care, Other (non HMO) | Admitting: Family

## 2022-05-28 ENCOUNTER — Encounter: Payer: Self-pay | Admitting: Family

## 2022-05-28 ENCOUNTER — Other Ambulatory Visit: Payer: Self-pay

## 2022-05-28 VITALS — BP 145/95 | HR 86 | Temp 97.4°F | Ht 72.0 in | Wt 220.2 lb

## 2022-05-28 DIAGNOSIS — B20 Human immunodeficiency virus [HIV] disease: Secondary | ICD-10-CM | POA: Diagnosis not present

## 2022-05-28 DIAGNOSIS — N521 Erectile dysfunction due to diseases classified elsewhere: Secondary | ICD-10-CM

## 2022-05-28 DIAGNOSIS — Z0000 Encounter for general adult medical examination without abnormal findings: Secondary | ICD-10-CM | POA: Diagnosis not present

## 2022-05-28 DIAGNOSIS — Z9189 Other specified personal risk factors, not elsewhere classified: Secondary | ICD-10-CM | POA: Diagnosis not present

## 2022-05-28 DIAGNOSIS — Z113 Encounter for screening for infections with a predominantly sexual mode of transmission: Secondary | ICD-10-CM

## 2022-05-28 DIAGNOSIS — R768 Other specified abnormal immunological findings in serum: Secondary | ICD-10-CM | POA: Diagnosis not present

## 2022-05-28 DIAGNOSIS — Z79899 Other long term (current) drug therapy: Secondary | ICD-10-CM

## 2022-05-28 MED ORDER — AMLODIPINE BESYLATE 10 MG PO TABS
ORAL_TABLET | ORAL | 5 refills | Status: DC
Start: 2022-05-28 — End: 2023-01-17

## 2022-05-28 MED ORDER — BIKTARVY 50-200-25 MG PO TABS: ORAL_TABLET | ORAL | 5 refills | Status: DC

## 2022-05-28 MED ORDER — SILDENAFIL CITRATE 100 MG PO TABS: 50.0000 mg | ORAL_TABLET | Freq: Every day | ORAL | 1 refills | Status: DC | PRN

## 2022-05-28 NOTE — Progress Notes (Signed)
Brief Narrative   Patient ID: Phillip Gill, male    DOB: 10/11/1975, 46 y.o.   MRN: [id number]  Mr. Broyles is a 46 y/o AA male diagnosed with HIV disease in July 2012 with risk factor of MSM. Initial viral load of 99,000 and CD4 of 510 entering care at Stage 1. [id number] negative. Initial genotype with no significant resistance. Previous ART experience with Prezista, Truvada, and Biktarvy.    Subjective:    Chief Complaint  Patient presents with   Follow-up    HPI:  Phillip Gill is a 46 y.o. male with HIV disease and chronic Hepatitis B last seen on 01/22/22 with well controlled virus and good adherence and tolerance to Biktarvy. Viral load was undetectable and CD4 count 1181. Chronic Hepatitis B with fibrosis score of F0 and positive Hepatitis B surface antigen. Both E antigen and E antibody were non-reactive. Most recent lab work with undetectable viral load and CD4 count 1,364. Creatinine slightly elevated at 1.40 (eGFR 63) with normal electrolytes and liver function testing. ASCVD risk is 9.2% with lipid profile .Here today for routine follow up.   Marc has been doing okay since last visit and continues to take [facility] as prescribed with no adverse side effects or problems obtaining from the pharmacy. Has concerns about his current living situation and people finding out about his diagnosis. Trying to get up with [facility] for assistance in finding alternative housing. Working multiple jobs and Production assistant, [employer] one job. Condoms and STD testing offered. Requesting referral for colonoscopy. Influenza vaccination up to date.   Denies fevers, chills, night sweats, headaches, changes in vision, neck pain/stiffness, nausea, diarrhea, vomiting, lesions or rashes.    Allergies  Allergen Reactions   Bactrim [Sulfamethoxazole W/Trimethoprim (Co-Trimoxazole)] Hives and Shortness Of Breath   Sulfa Antibiotics Hives and Shortness Of Breath   Cymbalta [Duloxetine  Hcl] Diarrhea   Duloxetine Diarrhea   Other Hives and Swelling    [employer] toothpaste    Zoloft [Sertraline Hcl] Diarrhea   Clindamycin/Lincomycin Rash   Neurontin [Gabapentin] Rash      Outpatient Medications Prior to Visit  Medication Sig Dispense Refill   amLODipine (NORVASC) 10 MG tablet TAKE 1 TABLET(10 MG) BY MOUTH DAILY 30 tablet 5   bictegravir-emtricitabine-tenofovir AF (BIKTARVY) 50-200-25 MG TABS tablet TAKE 1 TABLET BY MOUTH 1 TIME A DAY. 30 tablet 1   sildenafil (VIAGRA) 100 MG tablet Take 0.5-1 tablets (50-100 mg total) by mouth daily as needed for erectile dysfunction. Do not exceed 1 tablet per day. 30 tablet 1   cyclobenzaprine (FLEXERIL) 10 MG tablet Take 1 tablet (10 mg total) by mouth 2 (two) times daily as needed for muscle spasms. 20 tablet 0   predniSONE (DELTASONE) 20 MG tablet Take 2 tablets (40 mg total) by mouth daily. 10 tablet 0   prochlorperazine (COMPAZINE) 10 MG tablet Take 1 tablet (10 mg total) by mouth 2 (two) times daily as needed for up to 2 days for nausea. 4 tablet 0   No facility-administered medications prior to visit.     Past Medical History:  Diagnosis Date   Anxiety    Arthritis    "neck" (02/16/2015)   Chronic hepatitis B (HCC)    SECONDARY TO HIV   Chronic lower back pain    Depression    Fibromyalgia    Genital warts    HIV disease ([place]) 02/28/2015   HIV infection ([place])    followed by Dr. Linus Salmons- sees him every  4 months   Hypertension    IBS (irritable bowel syndrome)    Migraine    "none in years" (02/16/2015   Overweight 07/20/2015   Renal insufficiency 12/29/2013     Past Surgical History:  Procedure Laterality Date   CO2 LASER APPLICATION N/A 50/35/4656   Procedure: CO2 LASER APPLICATION;  Surgeon: Leighton Ruff, MD;  Location: [facility];  Service: General;  Laterality: N/A;   FOOT SURGERY     HIGH RESOLUTION ANOSCOPY N/A 02/11/2013   Procedure: HIGH RESOLUTION ANOSCOPY WITH BIOPSY, LASER ABLATION;   Surgeon: Leighton Ruff, MD;  Location: [facility];  Service: General;  Laterality: N/A;   WISDOM TOOTH EXTRACTION        Review of Systems  Constitutional:  Negative for appetite change, chills, fatigue, fever and unexpected weight change.  Eyes:  Negative for visual disturbance.  Respiratory:  Negative for cough, chest tightness, shortness of breath and wheezing.   Cardiovascular:  Negative for chest pain and leg swelling.  Gastrointestinal:  Negative for abdominal pain, constipation, diarrhea, nausea and vomiting.  Genitourinary:  Negative for dysuria, flank pain, frequency, genital sores, hematuria and urgency.  Skin:  Negative for rash.  Allergic/Immunologic: Negative for immunocompromised state.  Neurological:  Negative for dizziness and headaches.      Objective:    BP (!) 145/95   Pulse 86   Temp (!) 97.4 F (36.3 C) (Oral)   Ht 6' (1.829 m)   Wt 220 lb 3.2 oz (99.9 kg)   SpO2 100%   BMI 29.86 kg/m  Nursing note and vital signs reviewed.  Physical Exam Constitutional:      General: He is not in acute distress.    Appearance: He is well-developed.  Eyes:     Conjunctiva/sclera: Conjunctivae normal.  Cardiovascular:     Rate and Rhythm: Normal rate and regular rhythm.     Heart sounds: Normal heart sounds. No murmur heard.    No friction rub. No gallop.  Pulmonary:     Effort: Pulmonary effort is normal. No respiratory distress.     Breath sounds: Normal breath sounds. No wheezing or rales.  Chest:     Chest wall: No tenderness.  Abdominal:     General: Bowel sounds are normal.     Palpations: Abdomen is soft.     Tenderness: There is no abdominal tenderness.  Musculoskeletal:     Cervical back: Neck supple.  Lymphadenopathy:     Cervical: No cervical adenopathy.  Skin:    General: Skin is warm and dry.     Findings: No rash.  Neurological:     Mental Status: He is alert and oriented to person, place, and time.  Psychiatric:         Behavior: Behavior normal.        Thought Content: Thought content normal.        Judgment: Judgment normal.         05/28/2022   10:25 AM 01/22/2022   11:16 AM 09/26/2021    9:23 AM 02/07/2021    8:52 AM 12/20/2020   10:07 AM  Depression screen PHQ 2/9  Decreased Interest 0 0 0 0 0  Down, Depressed, Hopeless 0 0 0 0 0  PHQ - 2 Score 0 0 0 0 0       Assessment & Plan:    Patient Active Problem List   Diagnosis Date Noted   Snoring 01/22/2022   Encounter for physical examination related to employment  09/26/2021   Human monkeypox 04/06/2021   Cervical pain 02/07/2021   Erectile dysfunction 08/29/2020   Healthcare maintenance 04/05/2020   Foot lesion 12/24/2019   Human immunodeficiency virus (HIV) disease ([place]) 08/25/2019   Gonorrhea 02/11/2019   Hepatitis B surface antigen positive 09/05/2018   Overweight 07/20/2015   Colitis 03/08/2015   HIV (human immunodeficiency virus infection) ([place])    Tobacco abuse    LGV (lymphogranuloma venereum)    Primary syphilis    Chronic hepatitis B ([place]) 10/03/2014   HTN (hypertension)    Gynecomastia 04/30/2014   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 Reviewed lab work and discussed possibility of switching to injectable medications as an alternative based on his current living situation and concern for privacy. Would have to evaluate effect on Hepatitis B as he is currently on tenofovir through [place]. Would also need a Genosure Archive to ensure no underlying rilpivirine resistance with last Genosure from 2014. Would like to stay with [facility] for now. Will connect  with [facility] to help with social needs. Continue current dose of Biktarvy. Plan for follow up in 4 months or sooner if needed with lab work 1-2 weeks prior to appointment.       Relevant Medications   bictegravir-emtricitabine-tenofovir AF (BIKTARVY) 50-200-25 MG TABS tablet   Other Relevant Orders   T-helper cell (CD4)- ([facility] clinic only)   HIV-1 RNA quant-no reflex-bld   Comprehensive metabolic panel   Hepatitis B surface antigen positive    Shan continues to have positive Hepatitis B surface antigen positive with non-reactive Hepatitis B e antigen or antibody and suspect he is inactive at this point. Previous fibrosis score of F0. If Phillips Odor is changed I am not sure that he would need medication for Hepatitis B at this point with normal liver function and no evidence of cirrhosis.       Relevant Orders   Hepatitis B e antibody   Hepatitis B e antigen   Hepatitis B DNA, ultraquantitative, PCR   Hepatitis B surface antibody,qualitative   Liver Fibrosis, FibroTest-ActiTest   Healthcare maintenance    Discussed importance of safe sexual practice and condom use. Condoms and STD testing offered.  Referral for colonoscopy placed.  Influenza up to date per recommendations.  Will complete dental care independently.       Erectile dysfunction    Has good effect with sildenafil and no adverse side effects or priapism. Continue current dose of sildenafil as needed.       Other Visit Diagnoses     At risk for colon cancer       Relevant Orders   Ambulatory referral to Gastroenterology   Screening for STDs (sexually transmitted diseases)       Relevant Orders   RPR   At increased risk for cardiovascular disease       Relevant Orders   Lipid panel   Pharmacologic therapy       Relevant Orders   Lipid panel        I have discontinued Grover M. Tunison "Shan"'s prochlorperazine, predniSONE, and cyclobenzaprine. I am also having him maintain his  Biktarvy, amLODipine,  and sildenafil.   Meds ordered this encounter  Medications   bictegravir-emtricitabine-tenofovir AF (BIKTARVY) 50-200-25 MG TABS tablet    Sig: TAKE 1 TABLET BY MOUTH 1 TIME A DAY.    Dispense:  30 tablet    Refill:  5    Order Specific Question:   Supervising Provider    Answer:   Baxter Flattery, CYNTHIA [[id number]]   amLODipine (NORVASC) 10 MG tablet    Sig: TAKE 1 TABLET(10 MG) BY MOUTH DAILY    Dispense:  30 tablet    Refill:  5    Order Specific Question:   Supervising Provider    Answer:   Carlyle Basques [[id number]]   sildenafil (VIAGRA) 100 MG tablet    Sig: Take 0.5-1 tablets (50-100 mg total) by mouth daily as needed for erectile dysfunction. Do not exceed 1 tablet per day.    Dispense:  30 tablet    Refill:  1    Order Specific Question:   Supervising Provider    Answer:   Carlyle Basques [[id number]]     Follow-up: Return in about 4 months (around 09/26/2022), or if symptoms worsen or fail to improve.   Terri Piedra, MSN, FNP-C Nurse Practitioner [facility] [place] number: [phone]

## 2022-05-28 NOTE — Assessment & Plan Note (Signed)
Phillip Gill continues to have well controlled virus with good adherence and tolerance to [employer]. Reviewed lab work and discussed possibility of switching to injectable medications as an alternative based on his current living situation and concern for privacy. Would have to evaluate effect on Hepatitis B as he is currently on tenofovir through [place]. Would also need a Genosure Archive to ensure no underlying rilpivirine resistance with last Genosure from 2014. Would like to stay with [facility] for now. Will connect with [facility] to help with social needs. Continue current dose of Biktarvy. Plan for follow up in 4 months or sooner if needed with lab work 1-2 weeks prior to appointment.

## 2022-05-28 NOTE — Patient Instructions (Signed)
Nice to see you.  Continue to take your medication daily as prescribed.  Refills have been sent to the pharmacy.  Plan for follow up in 4 months or sooner if needed with lab work 1-2 weeks prior to appointment.   Have a great day and stay safe!  

## 2022-05-28 NOTE — Assessment & Plan Note (Signed)
Discussed importance of safe sexual practice and condom use. Condoms and STD testing offered.  Referral for colonoscopy placed.  Influenza up to date per recommendations.  Will complete dental care independently.

## 2022-05-28 NOTE — Assessment & Plan Note (Signed)
Phillip Gill continues to have positive Hepatitis B surface antigen positive with non-reactive Hepatitis B e antigen or antibody and suspect he is inactive at this point. Previous fibrosis score of F0. If [name] is changed I am not sure that he would need medication for Hepatitis B at this point with normal liver function and no evidence of cirrhosis.

## 2022-05-28 NOTE — Assessment & Plan Note (Signed)
Has good effect with sildenafil and no adverse side effects or priapism. Continue current dose of sildenafil as needed.

## 2022-05-29 ENCOUNTER — Ambulatory Visit: Payer: Managed Care, Other (non HMO) | Admitting: Neurology

## 2022-05-29 DIAGNOSIS — R519 Headache, unspecified: Secondary | ICD-10-CM

## 2022-05-29 DIAGNOSIS — G4730 Sleep apnea, unspecified: Secondary | ICD-10-CM

## 2022-05-29 DIAGNOSIS — G4734 Idiopathic sleep related nonobstructive alveolar hypoventilation: Secondary | ICD-10-CM

## 2022-05-29 DIAGNOSIS — G4733 Obstructive sleep apnea (adult) (pediatric): Secondary | ICD-10-CM | POA: Diagnosis not present

## 2022-05-29 DIAGNOSIS — Z820 Family history of epilepsy and other diseases of the nervous system: Secondary | ICD-10-CM

## 2022-05-29 DIAGNOSIS — G4731 Primary central sleep apnea: Secondary | ICD-10-CM

## 2022-05-29 DIAGNOSIS — R351 Nocturia: Secondary | ICD-10-CM

## 2022-05-29 DIAGNOSIS — R0681 Apnea, not elsewhere classified: Secondary | ICD-10-CM

## 2022-05-29 DIAGNOSIS — R400 Somnolence: Secondary | ICD-10-CM

## 2022-05-29 DIAGNOSIS — E663 Overweight: Secondary | ICD-10-CM

## 2022-05-29 DIAGNOSIS — R0683 Snoring: Secondary | ICD-10-CM

## 2022-05-31 NOTE — Progress Notes (Signed)
See procedure note.

## 2022-06-02 ENCOUNTER — Encounter (HOSPITAL_BASED_OUTPATIENT_CLINIC_OR_DEPARTMENT_OTHER): Payer: Self-pay

## 2022-06-02 ENCOUNTER — Emergency Department (HOSPITAL_BASED_OUTPATIENT_CLINIC_OR_DEPARTMENT_OTHER)
Admission: EM | Admit: 2022-06-02 | Discharge: 2022-06-03 | Disposition: A | Discharge status: Home / Self Care | Payer: Managed Care, Other (non HMO) | Attending: Emergency Medicine | Admitting: Emergency Medicine

## 2022-06-02 ENCOUNTER — Other Ambulatory Visit: Payer: Self-pay

## 2022-06-02 DIAGNOSIS — U071 COVID-19: Secondary | ICD-10-CM | POA: Insufficient documentation

## 2022-06-02 DIAGNOSIS — R059 Cough, unspecified: Secondary | ICD-10-CM | POA: Diagnosis present

## 2022-06-02 LAB — GROUP A STREP BY PCR: Group A Strep by PCR: NOT DETECTED

## 2022-06-02 LAB — RESP PANEL BY RT-PCR (FLU A&B, COVID) ARPGX2
Influenza A by PCR: NEGATIVE
Influenza B by PCR: NEGATIVE
SARS Coronavirus 2 by RT PCR: POSITIVE — AB

## 2022-06-02 MED ORDER — ONDANSETRON HCL 4 MG PO TABS: 4.0000 mg | ORAL_TABLET | Freq: Four times a day (QID) | ORAL | 0 refills | Status: DC

## 2022-06-02 MED ORDER — ACETAMINOPHEN 500 MG PO TABS: 500.0000 mg | ORAL_TABLET | Freq: Four times a day (QID) | ORAL | 0 refills | Status: DC | PRN

## 2022-06-02 MED ORDER — IBUPROFEN 800 MG PO TABS
800.0000 mg | ORAL_TABLET | Freq: Once | ORAL | Status: AC
  Administered 2022-06-02: 800 mg via ORAL
  Filled 2022-06-02: qty 1

## 2022-06-02 MED ORDER — BENZONATATE 100 MG PO CAPS
100.0000 mg | ORAL_CAPSULE | Freq: Once | ORAL | Status: AC
  Administered 2022-06-02: 100 mg via ORAL
  Filled 2022-06-02: qty 1

## 2022-06-02 MED ORDER — ONDANSETRON 4 MG PO TBDP
4.0000 mg | ORAL_TABLET | Freq: Once | ORAL | Status: AC
  Administered 2022-06-02: 4 mg via ORAL
  Filled 2022-06-02: qty 1

## 2022-06-02 NOTE — ED Provider Notes (Signed)
[facility] EMERGENCY DEPARTMENT Provider Note   CSN: [id number] Arrival date & time: 06/02/22  2042     History  Chief Complaint  Patient presents with   Nasal Congestion   Cough   Nausea    Phillip Gill is a 46 y.o. male presenting with URI symptoms that started today.  Inclusive of nasal congestion, cough, nausea, sore throat and ear pain.  Cough Associated symptoms: chills, ear pain, rhinorrhea and sore throat        Home Medications Prior to Admission medications   Medication Sig Start Date End Date Taking? Authorizing Provider  acetaminophen (TYLENOL) 500 MG tablet Take 1 tablet (500 mg total) by mouth every 6 (six) hours as needed. 06/02/22  Yes Tennie Grussing A, PA-C  ondansetron (ZOFRAN) 4 MG tablet Take 1 tablet (4 mg total) by mouth every 6 (six) hours. 06/02/22  Yes Dakai Braithwaite A, PA-C  amLODipine (NORVASC) 10 MG tablet TAKE 1 TABLET(10 MG) BY MOUTH DAILY 05/28/22   Veryl Speak, FNP  bictegravir-emtricitabine-tenofovir AF (BIKTARVY) 50-200-25 MG TABS tablet TAKE 1 TABLET BY MOUTH 1 TIME A DAY. 05/28/22   Veryl Speak, FNP  sildenafil (VIAGRA) 100 MG tablet Take 0.5-1 tablets (50-100 mg total) by mouth daily as needed for erectile dysfunction. Do not exceed 1 tablet per day. 05/28/22   Veryl Speak, FNP      Allergies    Bactrim [sulfamethoxazole w/trimethoprim (co-trimoxazole)], Sulfa antibiotics, Cymbalta [duloxetine hcl], Duloxetine, Other, Zoloft [sertraline hcl], Clindamycin/lincomycin, and Neurontin [gabapentin]    Review of Systems   Review of Systems  Constitutional:  Positive for chills.  HENT:  Positive for congestion, ear pain, rhinorrhea and sore throat.   Respiratory:  Positive for cough.     Physical Exam Updated Vital Signs BP 128/80   Pulse (!) 106   Temp 98.2 F (36.8 C) (Oral)   Resp 20   SpO2 95%  Physical Exam Vitals and nursing note reviewed.  Constitutional:      General: He is not in acute  distress.    Appearance: Normal appearance. He is not ill-appearing.  HENT:     Head: Normocephalic and atraumatic.     Right Ear: Tympanic membrane normal.     Left Ear: Tympanic membrane normal.     Nose: Nose normal.     Mouth/Throat:     Mouth: Mucous membranes are moist.     Pharynx: Oropharynx is clear. No oropharyngeal exudate or posterior oropharyngeal erythema.  Eyes:     General: No scleral icterus.    Conjunctiva/sclera: Conjunctivae normal.  Cardiovascular:     Rate and Rhythm: Normal rate and regular rhythm.  Pulmonary:     Effort: Pulmonary effort is normal. No respiratory distress.     Breath sounds: Normal breath sounds. No wheezing.  Skin:    General: Skin is warm and dry.     Findings: No rash.  Neurological:     Mental Status: He is alert.  Psychiatric:        Mood and Affect: Mood normal.     ED Results / Procedures / Treatments   Labs (all labs ordered are listed, but only abnormal results are displayed) Labs Reviewed  RESP PANEL BY RT-PCR (FLU A&B, COVID) ARPGX2 - Abnormal; Notable for the following components:      Result Value   SARS Coronavirus 2 by RT PCR POSITIVE (*)    All other components within normal limits  GROUP A STREP BY PCR  EKG None  Radiology No results found.  Procedures Procedures   Medications Ordered in ED Medications  ibuprofen (ADVIL) tablet 800 mg (has no administration in time range)  ondansetron (ZOFRAN-ODT) disintegrating tablet 4 mg (has no administration in time range)  benzonatate (TESSALON) capsule 100 mg (has no administration in time range)    ED Course/ Medical Decision Making/ A&P                           Medical Decision Making Risk OTC drugs. Prescription drug management.   46 year old male presenting today with URI symptoms.  Tested positive for COVID.  Airway clear and tolerating secretions.  Lung sounds present in all lung fields.  Afebrile in the department.  Low suspicion pneumonia so  chest x-ray was not performed.  He has not tried any medications at home.  He was given a list of over-the-counter medications and I prescribed Tylenol and Zofran to his pharmacy as well.  He is agreeable to discharge with supportive care.  Return precautions discussed.   Final Clinical Impression(s) / ED Diagnoses Final diagnoses:  COVID-19    Rx / DC Orders ED Discharge Orders          Ordered    ondansetron (ZOFRAN) 4 MG tablet  Every 6 hours        06/02/22 2333    acetaminophen (TYLENOL) 500 MG tablet  Every 6 hours PRN        06/02/22 2333           Results and diagnoses were explained to the patient. Return precautions discussed in full. Patient had no additional questions and expressed complete understanding.   This chart was dictated using voice recognition software.  Despite best efforts to proofread,  errors can occur which can change the documentation meaning.    Woodroe Chen 06/02/22 2344    Alvira Monday, MD 06/05/22 2045

## 2022-06-02 NOTE — Discharge Instructions (Addendum)
You tested positive for COVID today.  This is the reason for all of your symptoms.  I have attached a work note for the next week.  I have also sent ondansetron to your pharmacy for any nausea and Tylenol for any body aches and headaches.  You may also use medication such as Tylenol Cold and flu or DayQuil and NyQuil over-the-counter.  Delsym and Robitussin are good for coughs as well.  It was a pleasure to meet you and I hope you feel better!  Please return to the nearest emergency department with any severe chest pain, difficulty breathing or other concerning symptoms.

## 2022-06-02 NOTE — ED Triage Notes (Signed)
Pt endorses cough, dry heaves, sore throat, and pressure behind eyes. RT ear is aching. Pt states sx started 3 weeks ago, got better, and they came back 2 days ago. Pt with nasal congestion; works in healthcare.

## 2022-06-03 NOTE — Procedures (Signed)
[facility]  HOME SLEEP TEST (Watch PAT) REPORT  STUDY DATE: 05/29/22  DOB: Feb 05, 1976  MRN: [id number]  ORDERING CLINICIAN: Huston Foley, MD, PhD   REFERRING CLINICIAN: Jeanine Luz, NP  CLINICAL INFORMATION/HISTORY: 46 year old male with a history of hepatitis B, fibromyalgia, HIV disease, hypertension, irritable bowel syndrome, migraine headaches, renal insufficiency, anxiety, arthritis, and overweight state, who reports snoring and excessive daytime somnolence.   Epworth sleepiness score: 21/24.  BMI: 29.3 kg/m  FINDINGS:   Sleep Summary:   Total Recording Time (hours, min): 10 hours, 6 min  Total Sleep Time (hours, min):  9 hours, 27 min  Percent REM (%):   inconclusive   Respiratory Indices:   Calculated pAHI (per hour):  83.9/hour         REM pAHI:    n/a       NREM pAHI: 83.9/hour  Central pAHI: 14.4/hour  Oxygen Saturation Statistics:    Oxygen Saturation (%) Mean: 91%   Minimum oxygen saturation (%):                 72%   O2 Saturation Range (%): 72 - 100%    O2 Saturation (minutes) <=88%: 107 min  Pulse Rate Statistics:   Pulse Mean (bpm):    84/min    Pulse Range (43 - 180/min, likely error)   IMPRESSION: 1. OSA (obstructive sleep apnea) , severe 2. Central Sleep Apnea 3. Nocturnal Hypoxemia  RECOMMENDATION:  This home sleep test demonstrates severe obstructive sleep apnea with a total AHI of 83.9/hour and O2 nadir of 72% with significant time below or at 88% saturation of over 90 minutes, indicating nocturnal hypoxemia. There was a mild central apnea component. Moderate to loud snoring was detected. Treatment with positive airway pressure is highly recommended. This will require - ideally - a full night CPAP titration study for proper treatment settings, O2 monitoring and mask fitting. For now, the patient will be advised to proceed with an autoPAP titration/trial at home.  A laboratory attended titration study can be  considered in the future for optimization of his treatment and better tolerance of therapy.  Alternative treatment options are limited secondary to the severity of the patient's sleep disordered breathing, but may include surgical treatment with Inspire, hypoglossal nerve stimulator, in carefully selected candidates meeting criteria.  Concomitant weight loss is recommended, where clinically appropriate. Please note, that untreated obstructive sleep apnea may carry additional perioperative morbidity. Patients with significant obstructive sleep apnea should receive perioperative PAP therapy and the surgeons and particularly the anesthesiologist should be informed of the diagnosis and the severity of the sleep disordered breathing. The patient should be cautioned not to drive, work at heights, or operate dangerous or heavy equipment when tired or sleepy. Review and reiteration of good sleep hygiene measures should be pursued with any patient. Other causes of the patient's symptoms, including circadian rhythm disturbances, an underlying mood disorder, medication effect and/or an underlying medical problem cannot be ruled out based on this test. Clinical correlation is recommended.  The patient and his referring provider will be notified of the test results. The patient will be seen in follow up in sleep clinic at [facility].  I certify that I have reviewed the raw data recording prior to the issuance of this report in accordance with the standards of the American Academy of Sleep Medicine (AASM).  INTERPRETING PHYSICIAN:   Huston Foley, MD, PhD  Board Certified in Neurology and Sleep Medicine  [facility] [address], [address] [place], [place] [address] [phone]

## 2022-06-04 ENCOUNTER — Telehealth: Payer: Self-pay | Admitting: *Deleted

## 2022-06-04 NOTE — Addendum Note (Signed)
Addended by: Huston Foley on: 06/04/2022 08:01 AM   Modules accepted: Orders

## 2022-06-04 NOTE — Telephone Encounter (Signed)
I called pt. I advised pt that Dr. Frances Furbish reviewed their sleep study results and found that pt severe OSA. Dr. Frances Furbish  recommends that pt starts autopap. I reviewed PAP compliance expectations with the pt. Pt is agreeable to starting an auto-PAP. I advised pt that an order will be sent to a DME, [facility], and they will call the pt within about one week after they file with the pt's insurance. They will show the pt how to use the machine, fit for masks, and troubleshoot the auto-PAP if needed. A follow up appt was made for insurance purposes with Dr. Frances Furbish on 08-13-2022 at 0745. Pt verbalized understanding to arrive 15 minutes early and bring their auto-PAP. A letter with all of this information in it will be mailed to the pt as a reminder. I verified with the pt that the address we have on file is correct. Pt verbalized understanding of results. Pt had no questions at this time but was encouraged to call back if questions arise. I have sent the order to [facility] and have received confirmation that they have received the order. [phone].

## 2022-06-04 NOTE — Telephone Encounter (Signed)
-----   Message from Huston Foley, MD sent at 06/04/2022  8:01 AM EST ----- Urgent set up requested on PAP therapy, due to severe OSA. Patient referred by his ID NP, I saw him on 04/18/2022 and he had a home sleep test on 05/29/2022.  He has severe sleep apnea.  Please advise patient that I would like for him to start an AutoPap machine at home.  We will write for machine, this typically comes through a DME company (of his choice, or as per insurance requirement). The DME representative will fit the patient with a mask of choice, educate him on how to use the machine, how to put the mask on, etc. I have placed an order in the chart. Please send the order to a local DME, talk to patient, send report to referring MD. Please also reinforce the need for compliance with treatment. We will need a FU in sleep clinic for 10 weeks post-PAP set up, please arrange that with me or one of our NPs. Thanks,   Huston Foley, MD, PhD [facility])

## 2022-06-09 IMAGING — CT CT ABD-PELV W/ CM
2 of 5 series · 16 of 46 positions shown, 18 images · IV contrast (omnipaque)
Comparison: CT 03/08/2015.

CLINICAL DATA: Pain.  Left lower quadrant abdominal pain.

EXAM:
CT ABDOMEN AND PELVIS WITH CONTRAST
TECHNIQUE: Multidetector CT imaging of the abdomen and pelvis was performed
using the standard protocol following bolus administration of
intravenous contrast.
CONTRAST:  100mL OMNIPAQUE IOHEXOL 300 MG/ML  SOLN

[Series 2: axial st · axial · 0.78mm/px · z∈[+846,+1306]mm · 13 of 104 slices shown, 15 images]
[im 6/104  soft-tissue]
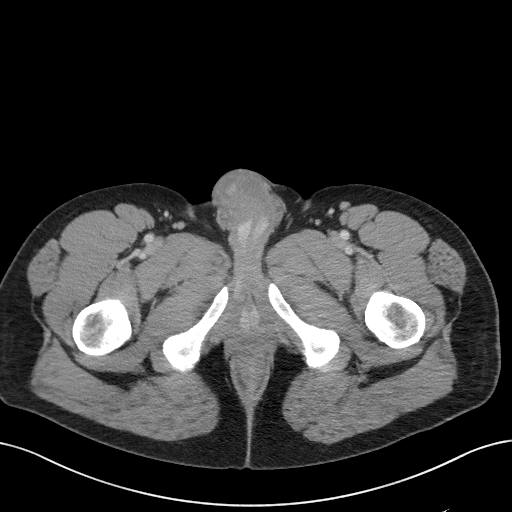
[im 6/104  bone]
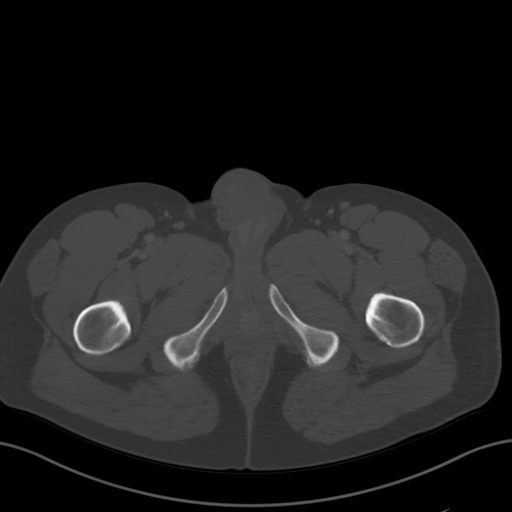
[im 16/104  soft-tissue]
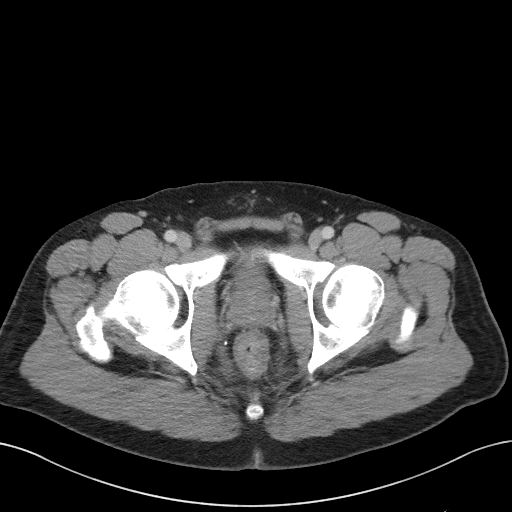
[im 21/104  soft-tissue]
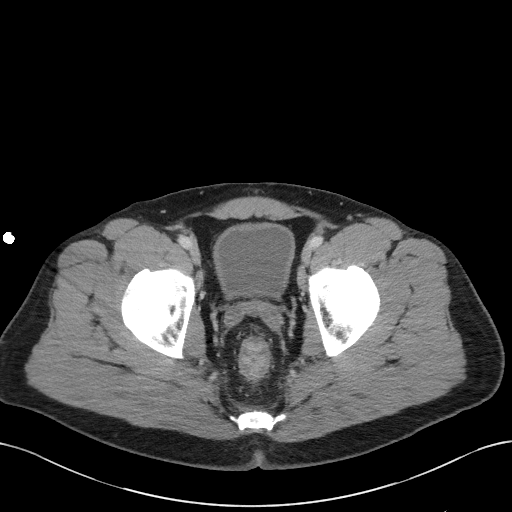
[im 31/104  soft-tissue]
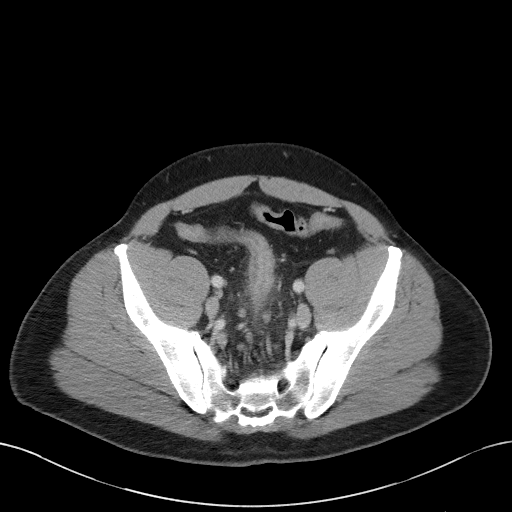
[im 37/104  soft-tissue]
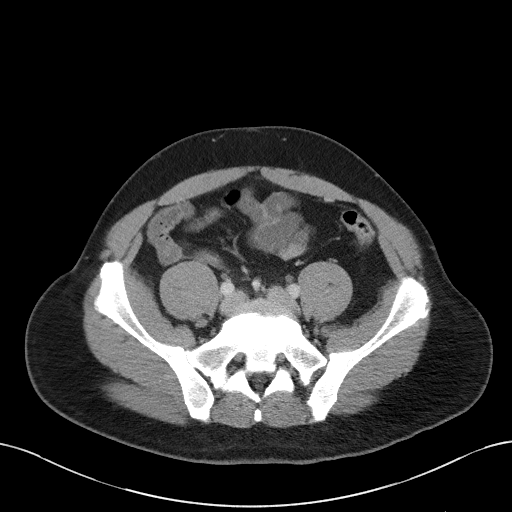
[im 47/104  soft-tissue]
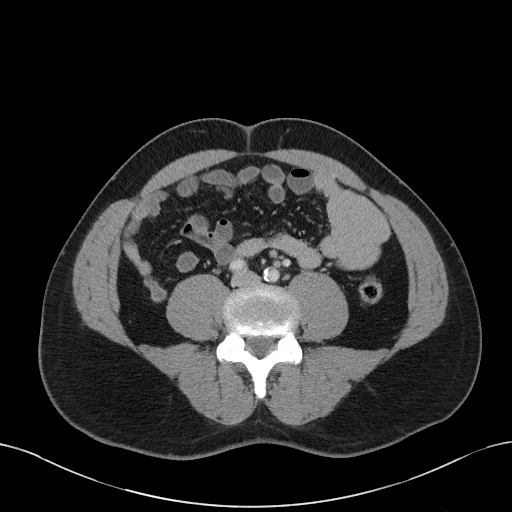
[im 52/104  soft-tissue]
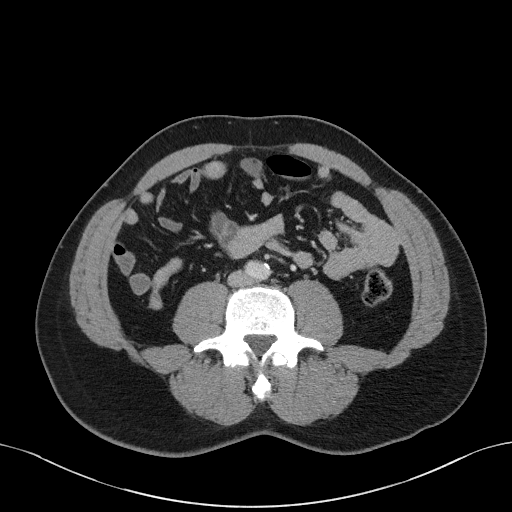
[im 57/104  soft-tissue]
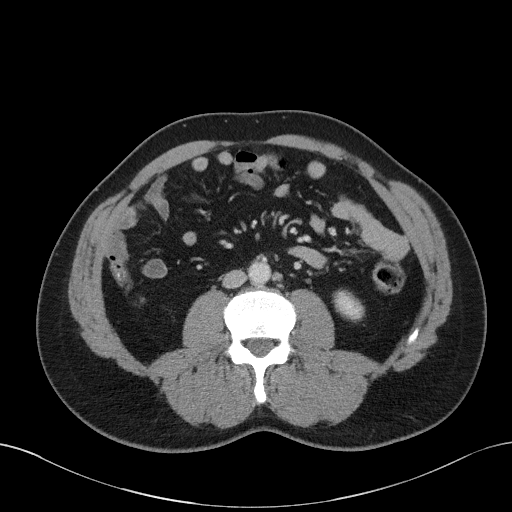
[im 67/104  soft-tissue]
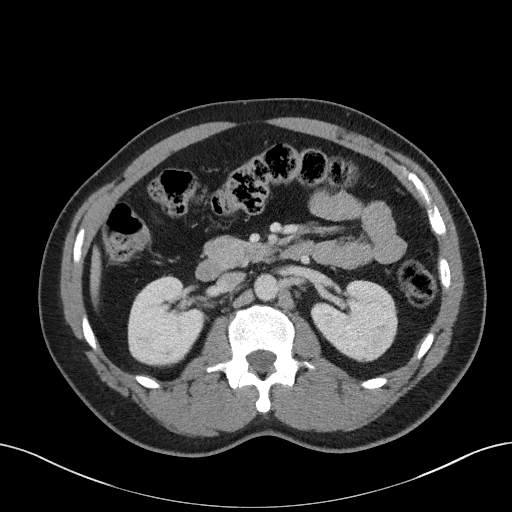
[im 67/104  bone]
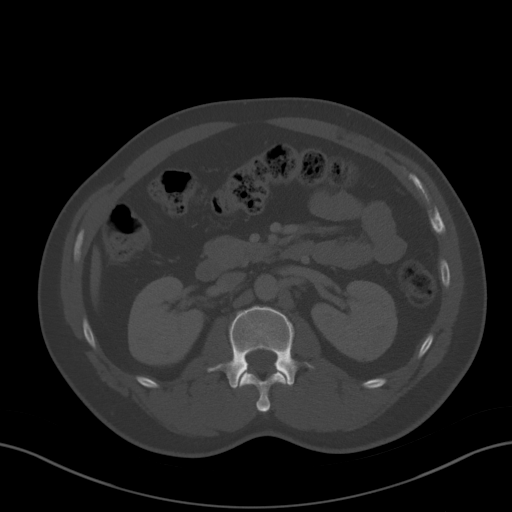
[im 73/104  soft-tissue]
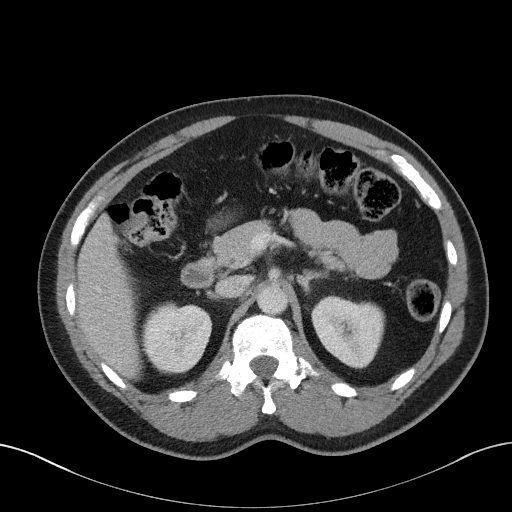
[im 83/104  soft-tissue]
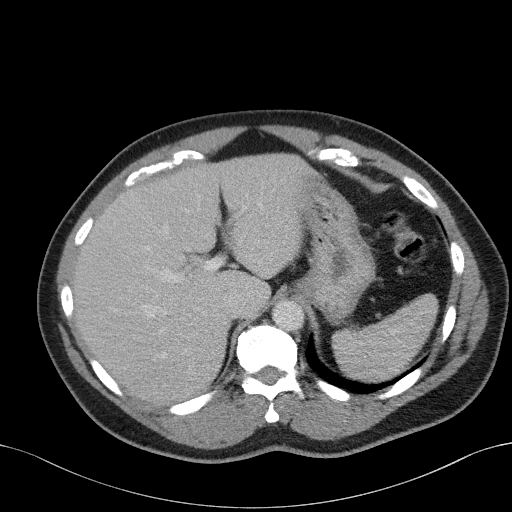
[im 88/104  soft-tissue]
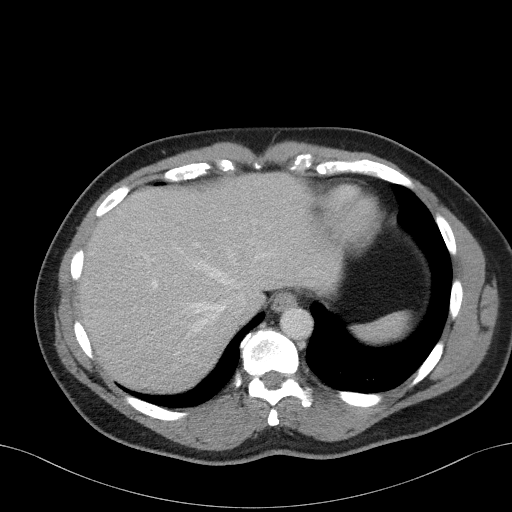
[im 98/104  soft-tissue]
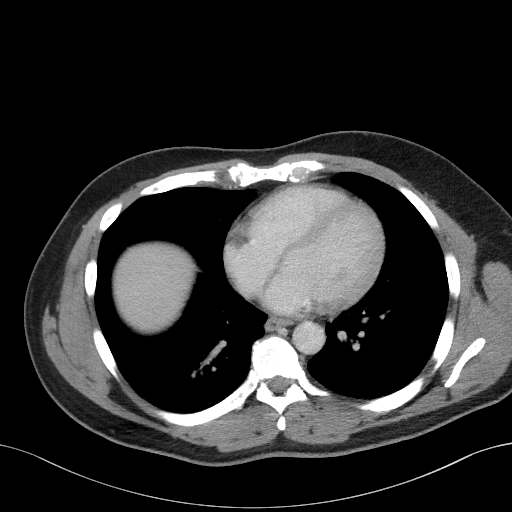

[Series 5: coronal st · coronal · 0.83mm/px · 3 of 90 slices shown]
[im 30/90  soft-tissue]
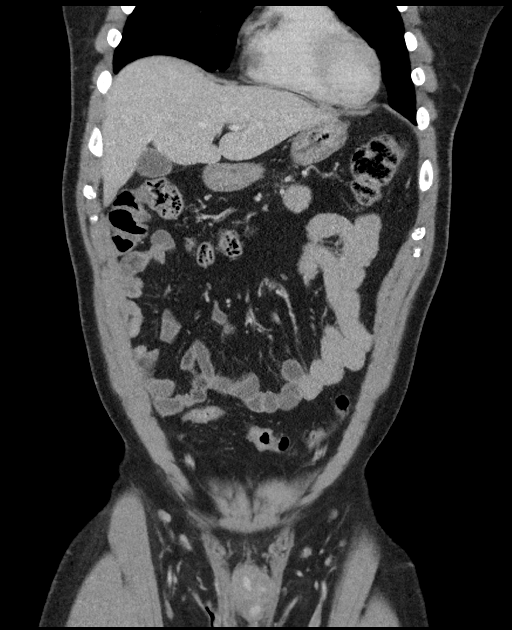
[im 40/90  soft-tissue]
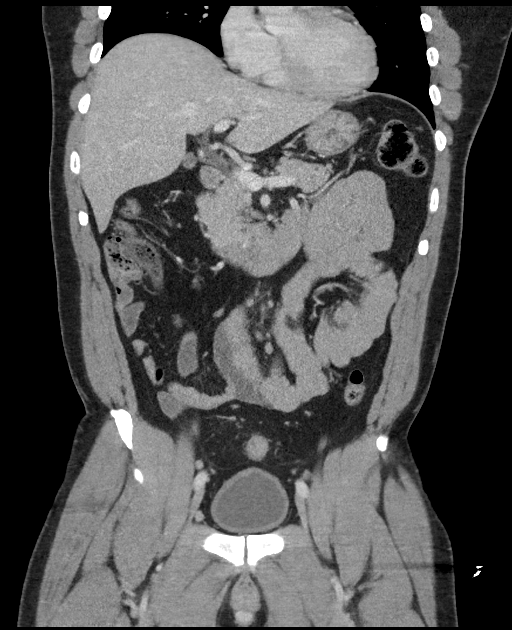
[im 50/90  soft-tissue]
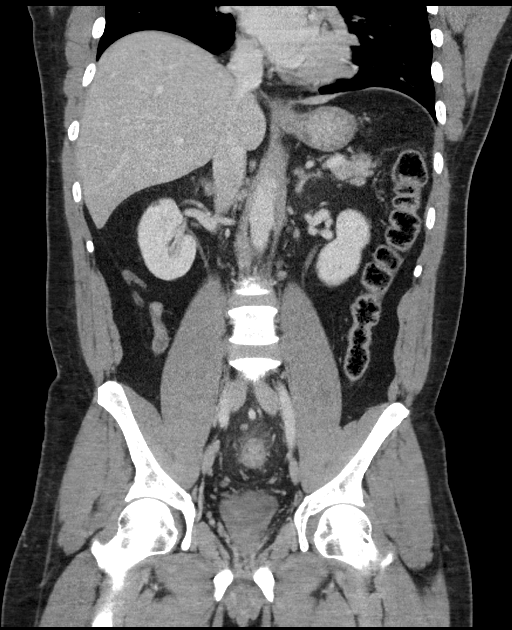

[16 of 46 positions shown; findings below may reference images not displayed]

FINDINGS: Lower chest: Lung bases are clear.  Pleural fluid.

Hepatobiliary: No focal liver abnormality is seen. No gallstones,
gallbladder wall thickening, or biliary dilatation.

Pancreas: No ductal dilatation or inflammation.

Spleen: Normal in size without focal abnormality.

Adrenals/Urinary Tract: Normal adrenal glands. Tiny subcentimeter
hypodense renal lesions are too small to accurately characterize,
but likely small cysts. No perinephric edema. Right kidney is
unremarkable. No hydronephrosis. No urolithiasis. Urinary bladder is
partially distended. No bladder wall thickening.

Stomach/Bowel: Moderate length wall thickening and adjacent edema
involving the distal sigmoid colon to rectum. No significant
diverticular disease. Moderate volume of stool in the more proximal
colon. Stomach and small bowel are unremarkable. Normal appendix.

Vascular/Lymphatic: Aorto bi-iliac atherosclerosis. No aortic
aneurysm. Scattered small retroperitoneal and central mesenteric
nodes are not enlarged by size criteria. Portal vein and mesenteric
vessels are patent.

Reproductive: Prostate is unremarkable.

Other: Pericolonic edema trace fluid in the pelvis. No perforation
or abscess. No free air.

Musculoskeletal: There are no acute or suspicious osseous
abnormalities. Stable tiny sclerotic density in the right iliac
bone.
IMPRESSION: Moderate length wall thickening and adjacent pericolonic edema
involving the distal sigmoid colon to rectum consistent with
colitis, likely infectious or inflammatory.

Aortic Atherosclerosis (WCTLZ-2RD.D).

## 2022-08-09 ENCOUNTER — Telehealth: Payer: Self-pay | Admitting: *Deleted

## 2022-08-09 NOTE — Telephone Encounter (Signed)
I called and LMVM for pt to call back about appt on 08-13-2022.  Not compliant (gap in recent download) illness/ out of town??  Check to see if move out appt to prior 09-07-2022.  Use 4 hour every night.  DL showed increased AHI , may increase autopap pressure to 14 if pt ok with this.  (See if improves).

## 2022-08-10 ENCOUNTER — Encounter: Payer: Self-pay | Admitting: Neurology

## 2022-08-13 ENCOUNTER — Encounter: Payer: Self-pay | Admitting: Neurology

## 2022-08-13 ENCOUNTER — Encounter: Payer: Managed Care, Other (non HMO) | Admitting: Neurology

## 2022-08-13 NOTE — Telephone Encounter (Signed)
Please discuss options to change pressure (ie increase max pressure to 14 cm) and push out appointment as I will discussed in previous phone note, before rescheduling.

## 2022-08-15 ENCOUNTER — Encounter: Payer: Self-pay | Admitting: Family

## 2022-08-27 NOTE — Telephone Encounter (Signed)
Attempted to call and mailbox full.  Try to make appt by 09-06-2022 with Dr. Rexene Alberts or NP

## 2022-09-03 ENCOUNTER — Telehealth: Payer: Self-pay | Admitting: *Deleted

## 2022-09-03 NOTE — Telephone Encounter (Signed)
-  Please call pt to schedule a cpap fu  he is due by 09-06-2022  If any NP or Dr. Rexene Alberts

## 2022-09-04 NOTE — Telephone Encounter (Signed)
Called pt to offer appt for tomorrow and was not able to reach the pt or leave a VM due to VM being full.

## 2022-09-04 NOTE — Telephone Encounter (Signed)
I called pt and could not LM VM full.

## 2022-09-12 ENCOUNTER — Other Ambulatory Visit: Payer: Self-pay

## 2022-09-26 ENCOUNTER — Ambulatory Visit: Payer: Self-pay | Admitting: Family

## 2022-10-20 DIAGNOSIS — Z21 Asymptomatic human immunodeficiency virus [HIV] infection status: Secondary | ICD-10-CM | POA: Diagnosis not present

## 2022-10-20 DIAGNOSIS — J029 Acute pharyngitis, unspecified: Secondary | ICD-10-CM | POA: Diagnosis not present

## 2022-10-20 DIAGNOSIS — B349 Viral infection, unspecified: Secondary | ICD-10-CM | POA: Diagnosis not present

## 2022-10-20 DIAGNOSIS — Z720 Tobacco use: Secondary | ICD-10-CM | POA: Diagnosis not present

## 2022-10-23 ENCOUNTER — Encounter: Payer: Self-pay | Admitting: Physician Assistant

## 2022-10-23 ENCOUNTER — Other Ambulatory Visit: Payer: Self-pay

## 2022-10-23 ENCOUNTER — Other Ambulatory Visit (HOSPITAL_COMMUNITY): Payer: Self-pay

## 2022-10-23 ENCOUNTER — Ambulatory Visit (INDEPENDENT_AMBULATORY_CARE_PROVIDER_SITE_OTHER): Payer: Medicaid Other | Admitting: Physician Assistant

## 2022-10-23 VITALS — BP 116/84 | HR 92 | Temp 98.0°F | Ht 72.0 in | Wt 215.0 lb

## 2022-10-23 DIAGNOSIS — B20 Human immunodeficiency virus [HIV] disease: Secondary | ICD-10-CM

## 2022-10-23 DIAGNOSIS — R768 Other specified abnormal immunological findings in serum: Secondary | ICD-10-CM

## 2022-10-23 DIAGNOSIS — B181 Chronic viral hepatitis B without delta-agent: Secondary | ICD-10-CM

## 2022-10-23 DIAGNOSIS — Z113 Encounter for screening for infections with a predominantly sexual mode of transmission: Secondary | ICD-10-CM

## 2022-10-23 DIAGNOSIS — F331 Major depressive disorder, recurrent, moderate: Secondary | ICD-10-CM | POA: Diagnosis not present

## 2022-10-23 DIAGNOSIS — Z79899 Other long term (current) drug therapy: Secondary | ICD-10-CM

## 2022-10-23 DIAGNOSIS — Z9189 Other specified personal risk factors, not elsewhere classified: Secondary | ICD-10-CM

## 2022-10-23 MED ORDER — BIKTARVY 50-200-25 MG PO TABS: ORAL_TABLET | ORAL | 5 refills | Status: DC

## 2022-10-23 NOTE — Progress Notes (Signed)
Subjective:    Patient ID: Phillip Gill, male    DOB: 28-Jun-1976, 47 y.o.   MRN: [id number]  Chief Complaint  Patient presents with   Follow-up    B206     HPI:  Phillip Gill is a 47 y.o. 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 Pt reports poor adherence due to MDD and recent viral illness x 2 months. He was recently seen by ED 10/20/2022 in [place], diagnosed with "Viral Syndrome."  His depression has been ongoing without improvement with fluoxetine.  He has been having crying spells for 2-3 weeks, sleeping more, apathy, panic attacks.  He denies  illicit drugs or alcohol use.Vaping daily. Last Sexually active January 2024. Does not want STI testing.   He is living between brother and mother's home in [place] and [place], [place]. On january 19th lost job after severe anxiety attack, went to ED. No suicidal thoughts, plans, or ideation at this time.   Allergies  Allergen Reactions   Bactrim [Sulfamethoxazole W/Trimethoprim (Co-Trimoxazole)] Hives and Shortness Of Breath   Sulfa Antibiotics Hives and Shortness Of Breath   Cymbalta [Duloxetine Hcl] Diarrhea   Duloxetine Diarrhea   Other Hives and Swelling    [employer] toothpaste    Zoloft [Sertraline Hcl] Diarrhea   Clindamycin/Lincomycin Rash   Neurontin [Gabapentin] Rash      Outpatient Medications Prior to Visit  Medication Sig Dispense Refill   acetaminophen (TYLENOL) 500 MG tablet Take 1 tablet (500 mg total) by mouth every 6 (six) hours as needed. 30 tablet 0   amLODipine (NORVASC) 10 MG tablet TAKE 1 TABLET(10 MG) BY MOUTH DAILY 30 tablet 5   bictegravir-emtricitabine-tenofovir AF (BIKTARVY) 50-200-25 MG TABS tablet TAKE 1 TABLET BY MOUTH 1 TIME A DAY. 30 tablet 5   ondansetron (ZOFRAN) 4 MG tablet Take 1 tablet (4 mg total) by mouth every 6 (six) hours. 12 tablet 0   sildenafil (VIAGRA) 100 MG tablet Take 0.5-1 tablets (50-100 mg total) by mouth daily as needed for erectile dysfunction. Do not exceed 1 tablet  per day. 30 tablet 1   No facility-administered medications prior to visit.     Past Medical History:  Diagnosis Date   Anxiety    Arthritis    "neck" (02/16/2015)   Chronic hepatitis B    SECONDARY TO HIV   Chronic lower back pain    Depression    Fibromyalgia    Genital warts    HIV disease 02/28/2015   HIV infection    followed by Dr. Luciana Axe- sees him every 4 months   Hypertension    IBS (irritable bowel syndrome)    Migraine    "none in years" (02/16/2015   Overweight 07/20/2015   Renal insufficiency 12/29/2013     Past Surgical History:  Procedure Laterality Date   CO2 LASER APPLICATION N/A 02/11/2013   Procedure: CO2 LASER APPLICATION;  Surgeon: Romie Levee, MD;  Location: [facility];  Service: General;  Laterality: N/A;   FOOT SURGERY     HIGH RESOLUTION ANOSCOPY N/A 02/11/2013   Procedure: HIGH RESOLUTION ANOSCOPY WITH BIOPSY, LASER ABLATION;  Surgeon: Romie Levee, MD;  Location:  SURGERY CENTER;  Service: General;  Laterality: N/A;   WISDOM TOOTH EXTRACTION         Review of Systems  Constitutional:  Negative for chills, diaphoresis, fatigue and fever.  HENT:  Negative for sinus pain and sore throat.   Respiratory:  Negative for cough and wheezing.   Cardiovascular:  Negative for chest pain, palpitations and leg swelling.  Gastrointestinal:  Negative for abdominal pain, constipation and diarrhea.  Skin:  Negative for rash.  Allergic/Immunologic: Positive for immunocompromised state.  Neurological:  Negative for dizziness, weakness and headaches.  Hematological:  Negative for adenopathy.  Psychiatric/Behavioral:  Positive for dysphoric mood. Negative for agitation, behavioral problems, confusion, decreased concentration, hallucinations, self-injury, sleep disturbance and suicidal ideas. The patient is nervous/anxious. The patient is not hyperactive.       Objective:    BP 116/84   Pulse 92   Temp 98 F (36.7 C) (Temporal)    Ht 6' (1.829 m)   Wt 215 lb (97.5 kg)   BMI 29.16 kg/m  Nursing note and vital signs reviewed.  Physical Exam Vitals reviewed.  Constitutional:      Appearance: Normal appearance. He is obese.  HENT:     Head: Normocephalic and atraumatic.  Eyes:     Extraocular Movements: Extraocular movements intact.     Conjunctiva/sclera: Conjunctivae normal.     Pupils: Pupils are equal, round, and reactive to light.  Cardiovascular:     Rate and Rhythm: Normal rate and regular rhythm.     Pulses: Normal pulses.     Heart sounds: Normal heart sounds. No murmur heard.    No friction rub. No gallop.  Pulmonary:     Effort: Pulmonary effort is normal.     Breath sounds: Normal breath sounds.  Musculoskeletal:        General: Normal range of motion.  Skin:    General: Skin is warm and dry.  Neurological:     General: No focal deficit present.     Mental Status: He is alert and oriented to person, place, and time.     Gait: Gait normal.  Psychiatric:        Attention and Perception: Attention and perception normal.        Mood and Affect: Mood is depressed. Affect is flat.        Speech: Speech is delayed.        Behavior: Behavior normal. Behavior is not agitated, aggressive, hyperactive or combative.        Thought Content: Thought content is not paranoid or delusional. Thought content does not include homicidal or suicidal ideation. Thought content does not include homicidal or suicidal plan.        Judgment: Judgment normal.         10/23/2022   11:14 AM 05/28/2022   10:25 AM 01/22/2022   11:16 AM 09/26/2021    9:23 AM 02/07/2021    8:52 AM  Depression screen PHQ 2/9  Decreased Interest 0 0 0 0 0  Down, Depressed, Hopeless 1 0 0 0 0  PHQ - 2 Score 1 0 0 0 0       Assessment & Plan:  MDD and Panic attacks: Referral to psychiatry, no longer taking fluoxetine, has been to counseling in the past, prefers psychiatry.  Mental health is significantly impacting his daily life and  ability to to be productive and perform activities of daily living.  Financial-set up Medicaid today, Adap current  HIV-1: not adherent to biktarvy, will go pick up prescription today at [facility]. Labs ordered per Phillip Gill.   [facility]abs today  Follow up in 1 month [facility]-dental referral placed  Patient Active Problem List   Diagnosis Date Noted   Snoring 01/22/2022   Encounter for physical examination related to employment 09/26/2021   Human monkeypox 04/06/2021   Cervical  pain 02/07/2021   Erectile dysfunction 08/29/2020   Healthcare maintenance 04/05/2020   Foot lesion 12/24/2019   Human immunodeficiency virus (HIV) disease 08/25/2019   Gonorrhea 02/11/2019   Hepatitis B surface antigen positive 09/05/2018   Overweight 07/20/2015   Colitis 03/08/2015   HIV (human immunodeficiency virus infection) (HCC)    Tobacco abuse    LGV (lymphogranuloma venereum)    Primary syphilis    Chronic hepatitis B 10/03/2014   HTN (hypertension)    Gynecomastia 04/30/2014   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 Donze "Shan" maintain his Biktarvy, amLODipine, sildenafil, ondansetron, and acetaminophen.   No orders of the defined types were placed in this encounter.    Follow-up: Return in about 1 month (around 11/22/2022) for B20.

## 2022-10-23 NOTE — Patient Instructions (Addendum)
Call [facility]ure[facility] Schimke to set up colonoscopy [phone] or check  Referral to psychiatry Refill Biktarvy Follow up in 1 month Labs today  Please call Claris Che with Dental to schedule [phone]

## 2022-10-24 LAB — T-HELPER CELL (CD4) - (RCID CLINIC ONLY)
CD4 % Helper T Cell: 21 % — ABNORMAL LOW (ref 33–65)
CD4 T Cell Abs: 761 /uL (ref 400–1790)

## 2022-10-30 LAB — LIPID PANEL
Cholesterol: 143 mg/dL (ref <200)
HDL: 30 mg/dL — ABNORMAL LOW (ref >=40)
LDL Cholesterol (Calc): 91 mg/dL (calc)
Non-HDL Cholesterol (Calc): 113 mg/dL (calc) (ref <130)
Total CHOL/HDL Ratio: 4.8 (calc) (ref <5.0)
Triglycerides: 127 mg/dL (ref <150)

## 2022-10-30 LAB — LIVER FIBROSIS, FIBROTEST-ACTITEST
ALT: 73 U/L — ABNORMAL HIGH (ref 9–46)
Alpha-2-Macroglobulin: 167 mg/dL (ref 106–279)
Apolipoprotein A1: 110 mg/dL (ref 94–176)
Bilirubin: 0.4 mg/dL (ref 0.2–1.2)
Fibrosis Score: 0.25
GGT: 163 U/L — ABNORMAL HIGH (ref 3–95)
Haptoglobin: 247 mg/dL — ABNORMAL HIGH (ref 43–212)
Necroinflammat ACT Score: 0.42
Reference ID: 4892506

## 2022-10-30 LAB — RPR: RPR Ser Ql: NONREACTIVE

## 2022-10-30 LAB — COMPREHENSIVE METABOLIC PANEL
AG Ratio: 1 (calc) (ref 1.0–2.5)
ALT: 71 U/L — ABNORMAL HIGH (ref 9–46)
AST: 61 U/L — ABNORMAL HIGH (ref 10–40)
Albumin: 4.4 g/dL (ref 3.6–5.1)
Alkaline phosphatase (APISO): 89 U/L (ref 36–130)
BUN/Creatinine Ratio: 8 (calc) (ref 6–22)
BUN: 10 mg/dL (ref 7–25)
CO2: 28 mmol/L (ref 20–32)
Calcium: 9.6 mg/dL (ref 8.6–10.3)
Chloride: 102 mmol/L (ref 98–110)
Creat: 1.31 mg/dL — ABNORMAL HIGH (ref 0.60–1.29)
Globulin: 4.2 g/dL (calc) — ABNORMAL HIGH (ref 1.9–3.7)
Glucose, Bld: 111 mg/dL — ABNORMAL HIGH (ref 65–99)
Potassium: 3.6 mmol/L (ref 3.5–5.3)
Sodium: 139 mmol/L (ref 135–146)
Total Bilirubin: 0.4 mg/dL (ref 0.2–1.2)
Total Protein: 8.6 g/dL — ABNORMAL HIGH (ref 6.1–8.1)

## 2022-10-30 LAB — HEPATITIS B DNA, ULTRAQUANTITATIVE, PCR
Hepatitis B DNA: 1740 IU/mL — ABNORMAL HIGH
Hepatitis B virus DNA: 3.24 Log IU/mL — ABNORMAL HIGH

## 2022-10-30 LAB — HEPATITIS B E ANTIGEN: Hep B E Ag: REACTIVE — AB

## 2022-10-30 LAB — HEPATITIS B E ANTIBODY: Hep B E Ab: NONREACTIVE

## 2022-10-30 LAB — HEPATITIS B SURFACE ANTIBODY,QUALITATIVE: Hep B S Ab: NONREACTIVE

## 2022-10-30 LAB — HIV-1 RNA QUANT-NO REFLEX-BLD
HIV 1 RNA Quant: 48700 Copies/mL — ABNORMAL HIGH
HIV-1 RNA Quant, Log: 4.69 Log cps/mL — ABNORMAL HIGH

## 2022-10-31 ENCOUNTER — Telehealth: Payer: Self-pay

## 2022-10-31 NOTE — Telephone Encounter (Signed)
Received a VM from [place] at [facility] that patient hasn't picked up refills for Biktarvy since 08/03/2022 and wanted to make provider aware.   I tried contacting patient to see if he has picked up refills that was sent to [facility] on [address] on 10/23/2022. Left voicemail asking patient to return my call.    Ossie Yebra Lesli Albee, CMA

## 2022-10-31 NOTE — Telephone Encounter (Signed)
Spoke to patient - patient stated that he was waiting on his new medicaid card to come in the mail. I had our front desk staff look up his to see if it was active. I contacted [facility] with updated insurance information - they will have Biktarvy ready for pick up today. Patient notified to pick up RX today.    Phillip Gill Lesli Albee, CMA

## 2022-11-12 ENCOUNTER — Telehealth: Payer: Self-pay

## 2022-11-12 NOTE — Telephone Encounter (Signed)
Detectable Viral Load Intervention   Most recent VL:  HIV 1 RNA Quant  Date Value Ref Range Status  10/23/2022 48,700 (H) Copies/mL Final  05/14/2022 39 (H) Copies/mL Final  01/22/2022 45 (H) Copies/mL Final    Current ART regimen: Biktarvy  Appointment status: patient has future appointment scheduled  Called patient to discuss medication adherence and possible barriers to care.   Medication last dispensed (per chart review):    Dispensed Days Supply Quantity Provider Pharmacy  BIKTARVY 50-200-25 TABLET 08/03/2022 30 30 tablet Veryl Speak, FNP [facility]...  BIKTARVY 50-200-25 TABLET 05/29/2022 30 30 tablet Veryl Speak, FNP [facility]...    Medication Adherence   Unable to assess.    Barriers to Care   Unable to assess.    Interventions: Called Shan to see if he was able to pick up his [place] prescription, no answer. Left HIPAA compliant voicemail requesting callback.   Sandie Ano, RN

## 2022-11-20 ENCOUNTER — Ambulatory Visit: Payer: Medicaid Other | Admitting: Family

## 2022-12-06 ENCOUNTER — Encounter: Payer: Self-pay | Admitting: Neurology

## 2022-12-10 ENCOUNTER — Ambulatory Visit: Payer: Medicaid Other | Admitting: Family

## 2022-12-12 ENCOUNTER — Telehealth: Payer: Self-pay

## 2022-12-12 NOTE — Telephone Encounter (Signed)
Patient returned call - he is doing well and no issues with Biktarvy. Patienta aware of appointment on 6/17 at 9:45 AM with [name].   Phillip Gill, CMA

## 2022-12-12 NOTE — Telephone Encounter (Signed)
Detectable Viral Load Intervention   Most recent VL:  HIV 1 RNA Quant  Date Value Ref Range Status  10/23/2022 48,700 (H) Copies/mL Final  05/14/2022 39 (H) Copies/mL Final  01/22/2022 45 (H) Copies/mL Final    Current ART regimen: Biktarvy  Appointment status: patient has future appointment scheduled  Called patient to discuss medication adherence and possible barriers to care.   Medication last dispensed (per chart review):   Dispensed Days Supply Quantity Provider Pharmacy  BIKTARVY 50/200/25MG  TABLETS 10/31/2022 30 30 each Veryl Speak, FNP [facility]...  BIKTARVY 50-200-25 TABLET 08/03/2022 30 30 tablet Veryl Speak, FNP [facility]...  BIKTARVY 50-200-25 TABLET 05/29/2022 30 30 tablet Veryl Speak, FNP [facility]...    Medication Adherence   Unable to assess.    Barriers to Care   Unable to assess.     Interventions: called Shan, no answer. Left HIPAA compliant voicemail requesting callback. Will attempt to reach him via MyChart as well.   Sandie Ano, RN

## 2022-12-17 ENCOUNTER — Other Ambulatory Visit: Payer: Self-pay

## 2022-12-17 ENCOUNTER — Encounter: Payer: Self-pay | Admitting: Family

## 2022-12-17 ENCOUNTER — Other Ambulatory Visit: Payer: Self-pay | Admitting: Family

## 2022-12-17 ENCOUNTER — Ambulatory Visit (INDEPENDENT_AMBULATORY_CARE_PROVIDER_SITE_OTHER): Payer: Medicaid Other | Admitting: Family

## 2022-12-17 VITALS — BP 136/85 | HR 71 | Resp 16 | Ht 72.0 in | Wt 212.0 lb

## 2022-12-17 DIAGNOSIS — F332 Major depressive disorder, recurrent severe without psychotic features: Secondary | ICD-10-CM

## 2022-12-17 DIAGNOSIS — F32A Depression, unspecified: Secondary | ICD-10-CM

## 2022-12-17 DIAGNOSIS — B181 Chronic viral hepatitis B without delta-agent: Secondary | ICD-10-CM

## 2022-12-17 DIAGNOSIS — Z21 Asymptomatic human immunodeficiency virus [HIV] infection status: Secondary | ICD-10-CM

## 2022-12-17 DIAGNOSIS — Z609 Problem related to social environment, unspecified: Secondary | ICD-10-CM

## 2022-12-17 DIAGNOSIS — Z0000 Encounter for general adult medical examination without abnormal findings: Secondary | ICD-10-CM

## 2022-12-17 DIAGNOSIS — M79672 Pain in left foot: Secondary | ICD-10-CM | POA: Diagnosis not present

## 2022-12-17 DIAGNOSIS — R350 Frequency of micturition: Secondary | ICD-10-CM

## 2022-12-17 DIAGNOSIS — M542 Cervicalgia: Secondary | ICD-10-CM | POA: Diagnosis not present

## 2022-12-17 MED ORDER — SILDENAFIL CITRATE 100 MG PO TABS: 50.0000 mg | ORAL_TABLET | Freq: Every day | ORAL | 1 refills | Status: DC | PRN

## 2022-12-17 MED ORDER — FINASTERIDE 5 MG PO TABS: 5.0000 mg | ORAL_TABLET | Freq: Every day | ORAL | 1 refills | Status: DC

## 2022-12-17 MED ORDER — ESCITALOPRAM OXALATE 10 MG PO TABS: 10.0000 mg | ORAL_TABLET | Freq: Every day | ORAL | 2 refills | Status: DC

## 2022-12-17 NOTE — Progress Notes (Unsigned)
Brief Narrative   Patient ID: Phillip Gill, male    DOB: 1976/03/30, 47 y.o.   MRN: [id number]  Phillip Gill is a 47 y/o AA male diagnosed with HIV disease in July 2012 with risk factor of MSM. Initial viral load of 99,000 and CD4 of 510 entering care at Stage 1. [id number] negative. Initial genotype with no significant resistance. Previous ART experience with Prezista, Truvada, and Biktarvy.    Subjective:    Chief Complaint  Patient presents with   Follow-up   HIV Positive/AIDS   Hepatitis B    HPI:  Phillip Gill is a 47 y.o. male with HIV disease and Hepatitis B last seen on 10/23/2022 by Arvilla Meres, PA-C with poorly controlled virus with less than optimal adherence to his Biktarvy secondary to depression and a "viral syndrome."  Fluoxetine had not been helping. HIV RNA level was 48,700 and Hepatitis B DNA level was 1,740. Was referred to psychiatry. Here today for follow up.  Phillip Gill has continued to have issues with depression since his last office visit and continues to have anhedonia and increased levels of sleeping and not wanting to be around people. Denies suicidal ideations. Not currently taking any medication and has not been able to get into psychiatry to this point. Has improved adherence and good tolerance to Biktarvy. Concerns about urination having increased frequency throughout the day and also at night having to go every 2 hours or so. Urine stream is unchanged and does have urgency at times. Does not drink a significant amount of caffeine or alcohol. Also has been having pain in his foot at the site of his previous surgery that gives him occasional pain. He has not worked since January and currently has assistance through [employer]. Condoms and STD testing offered.   Denies fevers, chills, night sweats, headaches, changes in vision, neck pain/stiffness, nausea, diarrhea, vomiting, lesions or rashes.  Allergies  Allergen Reactions   Bactrim [Sulfamethoxazole  W/Trimethoprim (Co-Trimoxazole)] Hives and Shortness Of Breath   Sulfa Antibiotics Hives and Shortness Of Breath   Cymbalta [Duloxetine Hcl] Diarrhea   Duloxetine Diarrhea   Other Hives and Swelling    [employer] toothpaste    Zoloft [Sertraline Hcl] Diarrhea   Clindamycin/Lincomycin Rash   Neurontin [Gabapentin] Rash      Outpatient Medications Prior to Visit  Medication Sig Dispense Refill   acetaminophen (TYLENOL) 500 MG tablet Take 1 tablet (500 mg total) by mouth every 6 (six) hours as needed. 30 tablet 0   amLODipine (NORVASC) 10 MG tablet TAKE 1 TABLET(10 MG) BY MOUTH DAILY 30 tablet 5   bictegravir-emtricitabine-tenofovir AF (BIKTARVY) 50-200-25 MG TABS tablet TAKE 1 TABLET BY MOUTH 1 TIME A DAY. 30 tablet 5   ondansetron (ZOFRAN) 4 MG tablet Take 1 tablet (4 mg total) by mouth every 6 (six) hours. 12 tablet 0   sildenafil (VIAGRA) 100 MG tablet Take 0.5-1 tablets (50-100 mg total) by mouth daily as needed for erectile dysfunction. Do not exceed 1 tablet per day. 30 tablet 1   No facility-administered medications prior to visit.     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  Comer- sees him every 4 months   Hypertension    IBS (irritable bowel syndrome)    Migraine    "none in years" (02/16/2015   Overweight 07/20/2015   Renal insufficiency 12/29/2013     Past Surgical History:  Procedure Laterality Date   CO2 LASER APPLICATION N/A 02/11/2013   Procedure: CO2 LASER APPLICATION;  Surgeon: Romie Levee, MD;  Location: [facility];  Service: General;  Laterality: N/A;   FOOT SURGERY     HIGH RESOLUTION ANOSCOPY N/A 02/11/2013   Procedure: HIGH RESOLUTION ANOSCOPY WITH BIOPSY, LASER ABLATION;  Surgeon: Romie Levee, MD;   Location: [facility];  Service: General;  Laterality: N/A;   WISDOM TOOTH EXTRACTION        Review of Systems  Constitutional:  Negative for appetite change, chills, fatigue, fever and unexpected weight change.  Eyes:  Negative for visual disturbance.  Respiratory:  Negative for cough, chest tightness, shortness of breath and wheezing.   Cardiovascular:  Negative for chest pain and leg swelling.  Gastrointestinal:  Negative for abdominal pain, constipation, diarrhea, nausea and vomiting.  Genitourinary:  Negative for dysuria, flank pain, frequency, genital sores, hematuria and urgency.  Skin:  Negative for rash.  Allergic/Immunologic: Negative for immunocompromised state.  Neurological:  Negative for dizziness and headaches.      Objective:    BP 136/85   Pulse 71   Resp 16   Ht 6' (1.829 m)   Wt 212 lb (96.2 kg)   SpO2 98%   BMI 28.75 kg/m  Nursing note and vital signs reviewed.  Physical Exam Constitutional:      General: He is not in acute distress.    Appearance: He is well-developed.  Eyes:     Conjunctiva/sclera: Conjunctivae normal.  Cardiovascular:     Rate and Rhythm: Normal rate and regular rhythm.     Heart sounds: Normal heart sounds. No murmur heard.    No friction rub. No gallop.  Pulmonary:     Effort: Pulmonary effort is normal. No respiratory distress.     Breath sounds: Normal breath sounds. No wheezing or rales.  Chest:     Chest wall: No tenderness.  Abdominal:     General: Bowel sounds are normal.     Palpations: Abdomen is soft.     Tenderness: There is no abdominal tenderness.  Musculoskeletal:     Cervical back: Neck supple.  Lymphadenopathy:     Cervical: No cervical adenopathy.  Skin:    General: Skin is warm and dry.     Findings: No rash.  Neurological:     Mental Status: He is alert and oriented to person, place, and time.  Psychiatric:        Behavior: Behavior normal.        Thought Content: Thought content  normal.        Judgment: Judgment normal.         12/17/2022    9:48 AM 10/23/2022   11:14 AM 05/28/2022   10:25 AM 01/22/2022   11:16 AM 09/26/2021    9:23 AM  Depression screen PHQ 2/9  Decreased Interest 0 0 0 0 0  Down, Depressed, Hopeless 0 1 0 0 0  PHQ - 2 Score 0 1 0 0 0       Assessment & Plan:    Patient Active Problem List   Diagnosis Date Noted   Urinary frequency 12/18/2022   Left foot pain 12/18/2022   Poor social situation 12/18/2022  Snoring 01/22/2022   Encounter for physical examination related to employment 09/26/2021   Human monkeypox 04/06/2021   Cervical pain 02/07/2021   Erectile dysfunction 08/29/2020   Healthcare maintenance 04/05/2020   Foot lesion 12/24/2019   Human immunodeficiency virus (HIV) disease (HCC) 08/25/2019   Gonorrhea 02/11/2019   Hepatitis B surface antigen positive 09/05/2018   Overweight 07/20/2015   Colitis 03/08/2015   HIV (human immunodeficiency virus infection) (HCC)    Tobacco abuse    LGV (lymphogranuloma venereum)    Primary syphilis    Chronic hepatitis B (HCC) 10/03/2014   HTN (hypertension)    Gynecomastia 04/30/2014   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 Will check lab work and continue current dose of Biktarvy. Will obtain US at next visit.       Relevant Orders   COMPLETE METABOLIC PANEL WITH GFR (Completed)   Hepatitis B DNA, ultraquantitative, PCR     Other   Depression    Phillip Gill continues to have poorly controlled depression that is interfering with his life activities and  currently is without suicidal ideation. Discussed importance of counseling and medication in recovery and provided resources including Family Services and [facility] as unfortunately Psychiatric services in the local area are very limited.  Will start Lexapro. Advised to seek emergent care if thoughts of suicide develop. Plan for follow up in 1 month or sooner if needed.       Relevant Medications   escitalopram (LEXAPRO) 10 MG tablet   HIV (human immunodeficiency virus infection) (HCC) - Primary    Phillip Gill hopefully has improved viral load with questionable improved adherence to [employer]. No adverse side effects when taking. Reviewed previous lab work and discussed importance of taking medication to reduce risk of disease progression and possible complications in the future. Check lab work. Continue current dose of Biktarvy. Plan for follow up in 1 month or sooner if needed.       Relevant Orders   COMPLETE METABOLIC PANEL WITH GFR (Completed)   HIV-1 RNA quant-no reflex-bld   T-helper cells (CD4) count (not at [facility])   Healthcare maintenance    Discussed importance of safe sexual practice and condom use. Condoms and STD testing offered.        Cervical pain    Phillip Gill continues to have chronic cervical pain with no radiculopathy. Emphasized continued home exercise therapy and OTC medications. Refer to Sports Medicine for further evaluation.       Urinary frequency    Phillip Gill is having urinary frequency throughout the day and the night. May possibly be related to sleep apnea, however cannot rule out any bladder spasms. Little concern for infectious origin. Will trial finasteride as he has sulfa allergy. May need to consider Urology referral pending trial of medication.       Relevant Medications   finasteride (PROSCAR) 5 MG tablet   Other Relevant Orders   Urinalysis, Routine w reflex microscopic (Completed)   Left foot pain    Phillip Gill has been having pain around his left  foot at the  previous site of surgery. There is mild tenderness with no clear evidence of infection or inflammation. Recommend conservative treatment with ice and will refer to Sports Medicine for further evaluation.       Relevant Orders   AMB referral to sports medicine   Poor social situation    Phillip Gill currently has food assistance program available. Stable housing currently. Groceries provided. Will consider referral to case management if needed.         I am having Phillip M. Hiscox "Phillip Gill" start on escitalopram and finasteride. I am also having him maintain his amLODipine, ondansetron, acetaminophen, Biktarvy, and sildenafil.   Meds ordered this encounter  Medications   escitalopram (LEXAPRO) 10 MG tablet    Sig: Take 1 tablet (10 mg total) by mouth daily.    Dispense:  30 tablet    Refill:  2    Order Specific Question:   Supervising Provider    Answer:   Drue Second, CYNTHIA [[id number]]   finasteride (PROSCAR) 5 MG tablet    Sig: Take 1 tablet (5 mg total) by mouth daily.    Dispense:  30 tablet    Refill:  1    Order Specific Question:   Supervising Provider    Answer:   Drue Second, CYNTHIA [[id number]]   sildenafil (VIAGRA) 100 MG tablet    Sig: Take 0.5-1 tablets (50-100 mg total) by mouth daily as needed for erectile dysfunction. Do not exceed 1 tablet per day.    Dispense:  30 tablet    Refill:  1    Order Specific Question:   Supervising Provider    Answer:   Judyann Munson [[id number]]     Follow-up: Return in about 1 month (around 01/16/2023), or if symptoms worsen or fail to improve.   Marcos Eke, MSN, FNP-C Nurse Practitioner [facility] Main number: [phone]

## 2022-12-17 NOTE — Patient Instructions (Addendum)
Nice to see you.  We will check your lab work today.  Continue to take your medication daily as prescribed.  Refills have been sent to the pharmacy.  [facility] [phone] Address: [address] [place], [place] [address]  [facility] [address] [place], [place], [place] [address]  Phone: [phone]  Plan for follow up in 1 months or sooner if needed with lab work on the same day.  Have a great day and stay safe!  Cervical Strain and Sprain Rehab Ask your health care provider which exercises are safe for you. Do exercises exactly as told by your health care provider and adjust them as directed. It is normal to feel mild stretching, pulling, tightness, or discomfort as you do these exercises. Stop right away if you feel sudden pain or your pain gets worse. Do not begin these exercises until told by your health care provider. Stretching and range-of-motion exercises Cervical side bending  Using good posture, sit on a stable chair or stand up. Without moving your shoulders, slowly tilt your left / right ear to your shoulder until you feel a stretch in the neck muscles on the opposite side. You should be looking straight ahead. Hold for __________ seconds. Repeat with the other side of your neck. Repeat __________ times. Complete this exercise __________ times a day. Cervical rotation  Using good posture, sit on a stable chair or stand up. Slowly turn your head to the side as if you are looking over your left / right shoulder. Keep your eyes level with the ground. Stop when you feel a stretch along the side and the back of your neck. Hold for __________ seconds. Repeat this by turning to your other side. Repeat __________ times. Complete this exercise __________ times a day. Thoracic extension and pectoral stretch  Roll a towel or a small blanket so it is about 4 inches (10 cm) in diameter. Lie down on your back on a firm  surface. Put the towel in the middle of your back across your spine. It should not be under your shoulder blades. Put your hands behind your head and let your elbows fall out to your sides. Hold for __________ seconds. Repeat __________ times. Complete this exercise __________ times a day. Strengthening exercises Upper cervical flexion  Lie on your back with a thin pillow behind your head or a small, rolled-up towel under your neck. Gently tuck your chin toward your chest and nod your head down to look toward your feet. Do not lift your head off the pillow. Hold for __________ seconds. Release the tension slowly. Relax your neck muscles completely before you repeat this exercise. Repeat __________ times. Complete this exercise __________ times a day. Cervical extension  Stand about 6 inches (15 cm) away from a wall, with your back facing the wall. Place a soft object, about 6-8 inches (15-20 cm) in diameter, between the back of your head and the wall. A soft object could be a small pillow, a ball, or a folded towel. Gently tilt your head back and press into the soft object. Keep your jaw and forehead relaxed. Hold for __________ seconds. Release the tension slowly. Relax your neck muscles completely before you repeat this exercise. Repeat __________ times. Complete this exercise __________ times a day. Posture and body mechanics Body mechanics refer to the movements and positions of your body while you do your daily activities. Posture is part of body mechanics. Good posture and healthy  body mechanics can help to relieve stress in your body's tissues and joints. Good posture means that your spine is in its natural S-curve position (your spine is neutral), your shoulders are pulled back slightly, and your head is not tipped forward. The following are general guidelines for using improved posture and body mechanics in your everyday activities. Sitting  When sitting, keep your spine neutral  and keep your feet flat on the floor. Use a footrest, if needed, and keep your thighs parallel to the floor. Avoid rounding your shoulders. Avoid tilting your head forward. When working at a desk or a computer, keep your desk at a height where your hands are slightly lower than your elbows. Slide your chair under your desk so you are close enough to maintain good posture. When working at a computer, place your monitor at a height where you are looking straight ahead and you do not have to tilt your head forward or downward to look at the screen. Standing  When standing, keep your spine neutral and keep your feet about hip-width apart. Keep a slight bend in your knees. Your ears, shoulders, and hips should line up. When you do a task in which you stand in one place for a long time, place one foot up on a stable object that is 2-4 inches (5-10 cm) high, such as a footstool. This helps keep your spine neutral. Resting When lying down and resting, avoid positions that are most painful for you. Try to support your neck in a neutral position. You can use a contour pillow or a small rolled-up towel. Your pillow should support your neck but not push on it. This information is not intended to replace advice given to you by your health care provider. Make sure you discuss any questions you have with your health care provider. Document Revised: 01/08/2022 Document Reviewed: 01/08/2022 Elsevier Patient Education  2024 [employer].

## 2022-12-18 ENCOUNTER — Encounter: Payer: Self-pay | Admitting: Family

## 2022-12-18 DIAGNOSIS — M79672 Pain in left foot: Secondary | ICD-10-CM | POA: Insufficient documentation

## 2022-12-18 DIAGNOSIS — R350 Frequency of micturition: Secondary | ICD-10-CM | POA: Insufficient documentation

## 2022-12-18 DIAGNOSIS — Z609 Problem related to social environment, unspecified: Secondary | ICD-10-CM | POA: Insufficient documentation

## 2022-12-18 LAB — URINALYSIS, ROUTINE W REFLEX MICROSCOPIC
Bilirubin Urine: NEGATIVE
Glucose, UA: NEGATIVE
Hgb urine dipstick: NEGATIVE
Ketones, ur: NEGATIVE
Leukocytes,Ua: NEGATIVE
Nitrite: NEGATIVE
Protein, ur: NEGATIVE
Specific Gravity, Urine: 1.017 (ref 1.001–1.035)
pH: <=5 (ref 5.0–8.0)

## 2022-12-18 NOTE — Assessment & Plan Note (Addendum)
Phillip Gill has been having pain around his left foot at the previous site of surgery. There is mild tenderness with no clear evidence of infection or inflammation. Recommend conservative treatment with ice and will refer to Sports Medicine for further evaluation.

## 2022-12-18 NOTE — Assessment & Plan Note (Signed)
Phillip Gill had previously been off [employer] and discussed importance of taking medication regularly and avoid stopping medication abruptly as this increases risk of a Hepatitis B flare. Will check lab work and continue current dose of Biktarvy. Will obtain US at next visit.

## 2022-12-18 NOTE — Assessment & Plan Note (Signed)
Phillip Gill continues to have poorly controlled depression that is interfering with his life activities and currently is without suicidal ideation. Discussed importance of counseling and medication in recovery and provided resources including Family Services and [facility] as unfortunately Psychiatric services in the local area are very limited.  Will start Lexapro. Advised to seek emergent care if thoughts of suicide develop. Plan for follow up in 1 month or sooner if needed.

## 2022-12-18 NOTE — Assessment & Plan Note (Signed)
Phillip Gill continues to have chronic cervical pain with no radiculopathy. Emphasized continued home exercise therapy and OTC medications. Refer to Sports Medicine for further evaluation.

## 2022-12-18 NOTE — Assessment & Plan Note (Addendum)
Phillip Gill currently has food assistance program available. Stable housing currently. Groceries provided. Will consider referral to case management if needed.

## 2022-12-18 NOTE — Assessment & Plan Note (Signed)
Phillip Gill hopefully has improved viral load with questionable improved adherence to [employer]. No adverse side effects when taking. Reviewed previous lab work and discussed importance of taking medication to reduce risk of disease progression and possible complications in the future. Check lab work. Continue current dose of Biktarvy. Plan for follow up in 1 month or sooner if needed.

## 2022-12-18 NOTE — Assessment & Plan Note (Signed)
Discussed importance of safe sexual practice and condom use. Condoms and STD testing offered.  

## 2022-12-18 NOTE — Assessment & Plan Note (Signed)
Phillip Gill is having urinary frequency throughout the day and the night. May possibly be related to sleep apnea, however cannot rule out any bladder spasms. Little concern for infectious origin. Will trial finasteride as he has sulfa allergy. May need to consider Urology referral pending trial of medication.

## 2022-12-19 ENCOUNTER — Other Ambulatory Visit: Payer: Self-pay

## 2022-12-19 ENCOUNTER — Encounter: Payer: Self-pay | Admitting: Family Medicine

## 2022-12-19 ENCOUNTER — Ambulatory Visit (INDEPENDENT_AMBULATORY_CARE_PROVIDER_SITE_OTHER): Payer: Medicaid Other

## 2022-12-19 ENCOUNTER — Encounter (HOSPITAL_COMMUNITY): Payer: Self-pay | Admitting: Behavioral Health

## 2022-12-19 ENCOUNTER — Ambulatory Visit (HOSPITAL_COMMUNITY)
Admission: EM | Admit: 2022-12-19 | Discharge: 2022-12-19 | Disposition: A | Discharge status: Home / Self Care | Payer: Medicaid Other | Attending: Behavioral Health | Admitting: Behavioral Health

## 2022-12-19 ENCOUNTER — Ambulatory Visit (INDEPENDENT_AMBULATORY_CARE_PROVIDER_SITE_OTHER): Payer: Medicaid Other | Admitting: Family Medicine

## 2022-12-19 VITALS — BP 132/84 | HR 81 | Ht 72.0 in | Wt 220.4 lb

## 2022-12-19 DIAGNOSIS — F329 Major depressive disorder, single episode, unspecified: Secondary | ICD-10-CM | POA: Diagnosis present

## 2022-12-19 DIAGNOSIS — M542 Cervicalgia: Secondary | ICD-10-CM

## 2022-12-19 DIAGNOSIS — F419 Anxiety disorder, unspecified: Secondary | ICD-10-CM | POA: Diagnosis present

## 2022-12-19 DIAGNOSIS — F330 Major depressive disorder, recurrent, mild: Secondary | ICD-10-CM | POA: Insufficient documentation

## 2022-12-19 DIAGNOSIS — M79672 Pain in left foot: Secondary | ICD-10-CM

## 2022-12-19 DIAGNOSIS — F411 Generalized anxiety disorder: Secondary | ICD-10-CM | POA: Insufficient documentation

## 2022-12-19 LAB — HIV-1 RNA QUANT-NO REFLEX-BLD
HIV 1 RNA Quant: 82 Copies/mL — ABNORMAL HIGH
HIV-1 RNA Quant, Log: 1.91 Log cps/mL — ABNORMAL HIGH

## 2022-12-19 LAB — COMPLETE METABOLIC PANEL WITH GFR
AG Ratio: 1.2 (calc) (ref 1.0–2.5)
ALT: 30 U/L (ref 9–46)
AST: 27 U/L (ref 10–40)
Albumin: 4.5 g/dL (ref 3.6–5.1)
Alkaline phosphatase (APISO): 80 U/L (ref 36–130)
BUN: 12 mg/dL (ref 7–25)
CO2: 29 mmol/L (ref 20–32)
Calcium: 9.9 mg/dL (ref 8.6–10.3)
Chloride: 105 mmol/L (ref 98–110)
Creat: 1.1 mg/dL (ref 0.60–1.29)
Globulin: 3.9 g/dL (calc) — ABNORMAL HIGH (ref 1.9–3.7)
Glucose, Bld: 108 mg/dL — ABNORMAL HIGH (ref 65–99)
Potassium: 4.1 mmol/L (ref 3.5–5.3)
Sodium: 139 mmol/L (ref 135–146)
Total Bilirubin: 0.5 mg/dL (ref 0.2–1.2)
Total Protein: 8.4 g/dL — ABNORMAL HIGH (ref 6.1–8.1)
eGFR: 84 mL/min/{1.73_m2} (ref >=60)

## 2022-12-19 LAB — HEPATITIS B DNA, ULTRAQUANTITATIVE, PCR
Hepatitis B DNA: <10 IU/mL — ABNORMAL HIGH
Hepatitis B virus DNA: <1 Log IU/mL — ABNORMAL HIGH

## 2022-12-19 LAB — T-HELPER CELLS (CD4) COUNT (NOT AT ARMC)
Absolute CD4: 1185 cells/uL (ref 490–1740)
CD4 T Helper %: 32 % (ref 30–61)
Total lymphocyte count: 3742 cells/uL (ref 850–3900)

## 2022-12-19 MED ORDER — DICLOFENAC SODIUM 1 % EX GEL
4.0000 g | Freq: Four times a day (QID) | CUTANEOUS | 11 refills | Status: AC
  Filled 2023-03-28: qty 100, 6d supply, fill #0
  Filled 2023-04-30: qty 100, 8d supply, fill #0
  Filled 2023-08-04: qty 100, 8d supply, fill #1
  Filled 2023-12-17: qty 100, 8d supply, fill #2

## 2022-12-19 NOTE — Progress Notes (Signed)
   12/19/22 1351  [facility] Triage Screening (Walk-ins at [facility] only)  How Did You Hear About [place]? Self  What Is the Reason for Your Visit/Call Today? Donyel is a 47 year old male presenting to [facility] with chief complaint of worsening depression and anxiety for the past year. Pt reports talking to IDC (infectious disease) doc on Monday about his depression, anxiety and panic attacks and the provider recommended he come here for treatment. Pt was also started on medications. Patient reports anxiety has been going on since 1999 after his grandmother passed away and he reports depression most of his life. Patient reports having an panic attack at work on 07/20/22 and they told him he couldn't come back until doctor clears him.  He hasn't work since. Patient  denies SI, HI, AVH. Patient is homeless/sheltered living between his family in [place] and his brother house in [place]. Patient reports his brother and his family has been gone for the past week to the beach so he has been isolated. Patient denies alcohol and drug use.  How Long Has This Been Causing You Problems? > than 6 months  Have You Recently Had Any Thoughts About Hurting Yourself? No  Are You Planning to Commit Suicide/Harm Yourself At This time? No  Have you Recently Had Thoughts About Hurting Someone Karolee Ohs? No  Are You Planning To Harm Someone At This Time? No  Are you currently experiencing any auditory, visual or other hallucinations? No  Have You Used Any Alcohol or Drugs in the Past 24 Hours? No  Do you have any current medical co-morbidities that require immediate attention? No  Clinician description of patient physical appearance/behavior: calm  What Do You Feel Would Help You the Most Today? Treatment for Depression or other mood problem  If access to [facility] was not available, would you have sought care in the Emergency Department? No  Determination of Need Routine (7 days)  Options For Referral Medication Management;Outpatient  Therapy

## 2022-12-19 NOTE — Progress Notes (Signed)
I, Stevenson Clinch, CMA acting as a scribe for Clementeen Graham, MD.  Phillip Gill is a 47 y.o. male who presents to [facility] today for L foot pain. 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 Logan Bores w/ podiatry. Today, pt reports improvement initially following surgery. Over the past few months, has started to notice burning pain around the area. Pt locates pain to surgical site. Notes intermittent swelling. Sx improve when at rest and worsen the more he is on his feet. No change in sx with variations in shoes. Sx are slightly worse when barefoot.   Aggravates: WB, ambulation Treatments tried: rest  Additionally he notes pain in the cervical spine associated with some reduced cervical range of motion.  He has never done physical therapy.  Dx imaging: 01/08/20 L foot XR  Pertinent review of systems: No fevers or chills  Relevant historical information: Hypertension   Exam:  BP 132/84   Pulse 81   Ht 6' (1.829 m)   Wt 220 lb 6.4 oz (100 kg)   SpO2 96%   BMI 29.89 kg/m  General: Well Developed, well nourished, and in no acute distress.   MSK: Left foot mature scar plantar foot.  Mildly tender palpation.  Normal foot and ankle motion.  Intact strength.  C-spine decreased cervical motion.  Nontender.  Abduction strength is intact.  Lab and Radiology Results  Diagn[facility] MSK Ultrasound of: Left plantar foot. Intact flexor houses longus tendon.  Slight hypoechoic fluid collecting superficial to the tendon at the area of prior surgery. 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  X-ray images cervical spine obtained today personally and independently interpreted Loss of cervical lordosis indicating spasm.  Mild degenerative changes present. Await  formal radiology review    Assessment and Plan: 47 y.o. male with foot pain.  Patient had a prior plantar fibromatosis excision.  Source of pain today could be some inflammation around the surgical site or slight recurrence.  There is a hypoechoic structure that could be injected.  Plan for Voltaren gel modification of footwear and proceed to injection if needed.  Neck pain thought to due to some degeneration and muscle spasm.  Good chance of improvement with physical therapy.  Plan to refer to PT.  Recheck in 6 weeks.   PDMP not reviewed this encounter. Orders Placed This Encounter  Procedures   [place] LIMITED JOINT SPACE STRUCTURES LOW LEFT(NO LINKED CHARGES)    Order Specific Question:   Reason for Exam (SYMPTOM  OR DIAGNOSIS REQUIRED)    Answer:   left foot pain    Order Specific Question:   Preferred imaging location?    Answer:   [facility] Cervical Spine 2 or 3 views    Standing Status:   Future    Number of Occurrences:   1    Standing Expiration Date:   12/19/2023    Order Specific Question:   Reason for Exam (SYMPTOM  OR DIAGNOSIS REQUIRED)    Answer:   neck pain    Order Specific Question:   Preferred imaging location?    Answer:   [facility]   Ambulatory referral to Physical Therapy    Referral Priority:   Routine    Referral Type:   Physical Medicine  Referral Reason:   Specialty Services Required    Requested Specialty:   Physical Therapy    Number of Visits Requested:   1   Meds ordered this encounter  Medications   diclofenac Sodium (VOLTAREN) 1 % GEL    Sig: Apply 4 g topically 4 (four) times daily. To affected joint.    Dispense:  100 g    Refill:  11     Discussed warning signs or symptoms. Please see discharge instructions. Patient expresses understanding.   The above documentation has been reviewed and is accurate and complete Clementeen Graham, M.D.

## 2022-12-19 NOTE — ED Provider Notes (Signed)
Behavioral Health Urgent Care Medical Screening Exam  Patient Name: Phillip Gill MRN: [id number] Date of Evaluation: 12/19/22 Chief Complaint:  "I haven't coped with the loss of my mom in 2017" Diagnosis:  Final diagnoses:  MDD (major depressive disorder), recurrent episode, mild (HCC)  Anxiety    History of Present Illness: Phillip Gill is a 47 y.o. male patient with a past psychiatric history of MDD and GAD who presented to [facility] voluntarily and unaccompanied for a walk-in assessment with complaints of worsening depression and anxiety for the past year.  Patient assessed face-to-face by this provider and chart reviewed on 12/19/22. On evaluation, Phillip Gill is seated in assessment area in no acute distress. Patient is alert and oriented x4, calm, cooperative, and pleasant. Speech is clear and coherent, normal rate and volume. Eye contact is good. Mood is depressed with congruent affect. Thought process is coherent with logical thought content. Patient denies suicidal and homicidal ideations and easily contracts verbally for safety with this Clinical research associate. Patient reports a history of one past suicide attempt in 1999 "I planned to but I didn't do it." Patient denies a history of self-harm. Patient denies past psychiatric hospitalizations. Patient denies auditory and visual hallucinations. Patient denies symptoms of paranoia. Patient is able to converse coherently with goal-directed thoughts and no distractibility or preoccupation. Objectively, there is no evidence of psychosis/mania, delusional thinking, or indication that patient is responding to internal or external stimuli.  Patient reports good sleep (10 hours/night) and varying appetite from "none to good." Patient states he is currently staying between his brother's home in [place] or with his aunts in [place]. Patient states that his brother and family have been at the beach for the past 2 weeks so he has felt alone. Patient denies  access to firearms/weapons. Patient reports occasional alcohol use "once in a blue moon" and states he last drank 1/3 pint of alcohol 2 days ago. Patient denies use of other illicit substances. Patient states he is currently unemployed. 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 Patient reports his job told him he couldn't come back until the doctor cleared him but states he doesn't want to return anyway due to the stress the job caused him. Patient reports on Monday, his infectious disease doctor prescribed him medications for anxiety and depression but he doesn't remember the name of them at this time. Per chart review, patient was started on Lexapro 10mg  at [facility] on 12/17/22 and was given resources for [facility]. Patient states he does not currently have outpatient psychiatric services in place for therapy or medication management. Patient states he was going to [facility] [place] "a few years ago." Patient states his worsening depression and anxiety are triggered by "I haven't coped with the loss of my mom in 2017." Patient states "I need someone to talk to on the regular. I want therapy and possibly medications."   Patient offered support and encouragement. Discussed with patient following up with outpatient psychiatric resources provided in AVS for therapy and medication management. Patient is in agreement with plan of care.  At this time, Phillip Gill is educated and verbalizes understanding of mental health resources and other crisis services in the community. He is instructed to call 911 and present to the nearest emergency room should he experience any suicidal/homicidal ideation, auditory/visual/hallucinations, or detrimental worsening of his mental health condition.  Flowsheet Row ED from 12/19/2022 in [facility] ED from 06/02/2022 in [facility] at [facility] ED from 03/26/2022 in [facility] RISK CATEGORY Low Risk No Risk No Risk       Psychiatric Specialty Exam  Presentation  General Appearance:Appropriate for Environment; Casual  Eye Contact:Good  Speech:Clear and Coherent; Normal Rate  Speech Volume:Normal  Handedness:Right   Mood and Affect  Mood: Depressed  Affect: Congruent   Thought Process  Thought Processes: Coherent; Goal Directed  Descriptions of Associations:Intact  Orientation:Full (Time, Place and Person)  Thought Content:Logical    Hallucinations:None  Ideas of Reference:None  Suicidal Thoughts:No  Homicidal Thoughts:No   Sensorium  Memory: Immediate Good; Recent Good; Remote Good  Judgment: Good  Insight: Good   Executive Functions  Concentration: Good  Attention Span: Good  Recall: Good  Fund of Knowledge: Good  Language: Good   Psychomotor Activity  Psychomotor Activity: Normal   Assets  Assets: Communication Skills; Desire for Improvement; Financial Resources/Insurance; Housing; Leisure Time; Physical Health; Resilience; Social Support; Transportation   Sleep  Sleep: Good  Number of hours:  10   Physical Exam: Physical Exam Vitals and nursing note reviewed.  Constitutional:      General: He is not in acute distress.    Appearance: Normal appearance. He is not ill-appearing.  HENT:     Head: Normocephalic and atraumatic.     Nose: Nose normal.  Eyes:     General:        Right eye: No discharge.        Left eye: No discharge.     Conjunctiva/sclera: Conjunctivae normal.  Cardiovascular:     Rate and Rhythm: Normal rate.  Pulmonary:     Effort: Pulmonary effort is normal. No respiratory distress.  Musculoskeletal:        General: Normal range of motion.     Cervical back: Normal range of motion.  Skin:    General: Skin is warm and dry.  Neurological:     General: No focal  deficit present.     Mental Status: He is alert and oriented to person, place, and time. Mental status is at baseline.  Psychiatric:        Attention and Perception: Attention and perception normal.        Mood and Affect: Mood is depressed.        Speech: Speech normal.        Behavior: Behavior normal. Behavior is cooperative.        Thought Content: Thought content normal. Thought content is not paranoid or delusional. Thought content does not include homicidal or suicidal ideation. Thought content does not include homicidal or suicidal plan.        Cognition and Memory: Cognition and memory normal.        Judgment: Judgment normal.     Comments: Affect: Congruent    Review of Systems  Constitutional: Negative.   HENT: Negative.    Eyes: Negative.   Respiratory: Negative.    Cardiovascular: Negative.   Gastrointestinal: Negative.   Genitourinary: Negative.   Musculoskeletal: Negative.   Skin: Negative.   Neurological: Negative.   Endo/Heme/Allergies: Negative.   Psychiatric/Behavioral:  Positive for depression. Negative for hallucinations, memory loss, substance abuse and suicidal ideas. The patient is not nervous/anxious and does not have insomnia.    Blood pressure (!) 135/92, pulse 71, temperature 97.7 F (36.5 C), temperature source Oral, resp.  rate 18, SpO2 98 %. There is no height or weight on file to calculate BMI.  Musculoskeletal: Strength & Muscle Tone: within normal limits Gait & Station: normal Patient leans: N/A   [facility] MSE Discharge Disposition for Follow up and Recommendations: Based on my evaluation the patient does not appear to have an emergency medical condition and can be discharged with resources and follow up care in outpatient services for Medication Management and Individual Therapy   Sunday Corn, NP 12/19/2022, 3:40 PM

## 2022-12-19 NOTE — Progress Notes (Signed)
Patient discharging at this time. Patient denies SI,HI, and A/V/H with no plan or intent. Patient provided with AVS along with follow up appointments and resources. Discharged by provider.

## 2022-12-19 NOTE — Discharge Instructions (Signed)
[facility]: Outpatient psychiatric Services:   Please see the walk in hours listed below.  Medication Management New Patient needing Medication Management Walk-in, and Existing Patients needing to see a provider for management coming as a walk in   Monday thru Friday 8:00 AM first come first serve until slots are full.  Recommend being there by 7:15 AM to ensure a slot is open.  Therapy New Patient Therapy Intake and Existing Patients needing to see therapist coming in as a walk in.   Monday, Wednesday, and Thursday morning at 8:00 am first come first serve.  Recommend being there by 7:15 AM to ensure a slot is open.    Every 1st, 2nd, and 3rd Friday at 1:00 PM first come first serve until slots are full.  Will still need to come in that morning at 7:15 AM to get registered for an afternoon slot.  For all walk-ins we ask that you arrive by 7:15 am because patients will be seen in there order of arrival (FIRST COME FIRST SERVE) Availability is limited, therefore you may not be seen on the same day that you walk in if all slots are full.    Our goal is to serve and meet the needs of our community to the best of our ability.     It is imperative that you follow through with treatment recommendations within 5-7 days from the day of discharge to mitigate further risk to your safety and overall mental well-being.  A list of outpatient therapy and psychiatric providers for medication management has been provided below to get you started in finding the right provider for you.            [place]  [facility] [address] [place], [place], [address] [phone] phone (Medicare, Private insurance except Tricare, Blue Local, and Blue Home)  [facility] [address] [place], [place], [address] [phone] phone (Aetna, AmeriHealth [place], BCBS, Cigna, Evernorth, Friday Health Plans,  Gateway Health, BCBS Healthy Blue, Humana, Magellan Health, Medcost, Medicare, Medicaid, Optum, Tricare, UHC, UHC Community Plan, Wellcare)  [facility] [address] [place], [place], [address] [phone] phone [phone] phone [phone] fax  [facility] [address] [place], [place], [address] [phone] phone (Call to confirm insurance coverage) Consultation & Support Services     o Drop-In Hours: 1:00 PM to 5:00 PM     o Days: Monday - Thursday  Crisis Services (24/7)   [facility] [address] [place], [place], [address] [phone] phone (Healthy Blue [place], UHC, [place] Medicaid, Vaya and Partners, Humana)      [facility] [address] [place], [place], [address] [phone] phone [email or web address]  (to complete the intake form and upload ID and insurance cards)  [facility] [address] [place], [place], [address] [phone] phone (Aetna, Alliance, BCBS, [place] Complete Health, Cigna, Medicare, Optum, UHC, Wellcare, and certain Medicaid plans)  [facility] [address] [place], [place], [address] [phone] phone [phone] fax (Medicaid, Medicare, Self-pay, call about other insurance coverage)  [facility] (age 13+) [address] [place], [place], [address] [phone] hone [phone] fax (Cigna, MedCost, BCBS, Aetna, Magellan, First Health, Healthgram, Humana, Tricare, Multiplan, certain Medicare providers, UHC, UMR)  [facility] [address] , [place], [address] [phone] phone (Medicare, Medicaid, Aetna,   Cigna, call about other insurance coverage)  [facility] [address] [place], [place], [address] [phone] phone [phone] fax (Call [phone]  to see what insurance is accepted) (Gerald Plovsky, MD specializes in geropsych)  [facility]  (medication management only) [address] [place], [place], [address] [phone] phone [phone] fax (Medicare, Medicaid, BCBS, Tricare, Humana, MedCost, Cigna, UHC, UMR)  [facility] (medication management only) [address] [place], [place], [address] [phone] phone [phone] fax (Cigna, Medicare, BCBS, Aetna, Tricare East)  [facility] [address] [place], [place], [address] [phone] phone [phone] fax (Aetna, BCBS, Cigna, Magellan, MedCost, UHC, [place] Medicaid/[place] Health Choice)  [facility] [address] [place], [place], [address] [phone] phone [phone] fax (Medicare, Medicaid, BCBS)  [facility] [address] [place], [place] [address] [phone] phone (Aetna, BCBS, Cigna, CBHA, MedCost, Medicare, Magellan, UHC) Does genetic testing for medications; does transcranial magnetic stimulation along with basic services)  [facility] [address] [place], [place], [address] [phone] phone (Call about insurance coverage)  [facility] [address] [place], [place], [address] [phone] phone [phone] fax (Call about insurance coverage)  [facility] [address] [place], [place], [address] [phone] phone [phone] fax (Call about insurance coverage)  [facility] [address] [place], [place], [address] [phone] phone (Medicaid, Tricare, BCBS, Aetna, Humana)  [facility] [address] [place], [place], [address] [phone] phone (Medicaid, Medicare, call about other insurance coverage)  [facility] [address] [place], [place], [address] [phone] phone [phone] fax (Medicaid, Medicare,  Tricare, call about other insurance coverage)  [facility] [address] , [place], [address] [phone] phone (Aetna, BCBS, Cigna, MedCost, Tricare, Medicaid types - Alliance, AmeriHealth, Partners, Vaya, [place] Health Choice, Healthy Blue, [place], Wellcare, and Complete)  [facility] [address] , [place], [address] [phone] phone Completely online treatment platform Contact: Jonay Argier - Behavioral Health Outreach Specialist [phone] phone [phone] fax (Aetna, BCBS, Cigna, Friday Health Plan, Humana, Magellan, MedCost, Medicaid, Medicare, UBH Optum) 

## 2022-12-19 NOTE — Patient Instructions (Addendum)
Thank you for coming in today.   Please get an Xray today before you leave   I've referred you to Physical Therapy.  Let us know if you don't hear from them in one week.   Try voltaren gel with a prescription.   Recheck in 1 month.   If not better we can do a shot.

## 2022-12-24 NOTE — Progress Notes (Signed)
Cervical spine x-ray shows some arthritis changes.  No fractures are visible.

## 2022-12-31 ENCOUNTER — Ambulatory Visit: Payer: Medicaid Other | Attending: Family Medicine

## 2022-12-31 ENCOUNTER — Ambulatory Visit: Payer: Medicaid Other | Admitting: Physical Therapy

## 2022-12-31 DIAGNOSIS — M542 Cervicalgia: Secondary | ICD-10-CM

## 2022-12-31 DIAGNOSIS — M546 Pain in thoracic spine: Secondary | ICD-10-CM | POA: Diagnosis present

## 2022-12-31 DIAGNOSIS — M79672 Pain in left foot: Secondary | ICD-10-CM | POA: Insufficient documentation

## 2022-12-31 DIAGNOSIS — G8929 Other chronic pain: Secondary | ICD-10-CM | POA: Diagnosis present

## 2022-12-31 DIAGNOSIS — M5442 Lumbago with sciatica, left side: Secondary | ICD-10-CM | POA: Diagnosis present

## 2022-12-31 DIAGNOSIS — M5413 Radiculopathy, cervicothoracic region: Secondary | ICD-10-CM | POA: Diagnosis present

## 2022-12-31 NOTE — Therapy (Signed)
OUTPATIENT PHYSICAL THERAPY LOWER EXTREMITY EVALUATION   Patient Name: YITZCHAK ALDAZ MRN: [id number] DOB:09/01/1975, 47 y.o., male Today's Date: 12/31/2022  END OF SESSION:  PT End of Session - 12/31/22 1316     Visit Number 1    Date for PT Re-Evaluation 03/25/23    Authorization Type [place] [employer]    PT Start Time 1315    PT Stop Time 1400    PT Time Calculation (min) 45 min    Activity Tolerance Patient limited by pain             Past Medical History:  Diagnosis Date   Anxiety    Arthritis    "neck" (02/16/2015)   Chronic hepatitis B (HCC)    SECONDARY TO HIV   Chronic lower back pain    Depression    Fibromyalgia    Genital warts    HIV disease (HCC) 02/28/2015   HIV infection (HCC)    followed by Dr. Luciana Axe- sees him every 4 months   Hypertension    IBS (irritable bowel syndrome)    Migraine    "none in years" (02/16/2015   Overweight 07/20/2015   Renal insufficiency 12/29/2013   Past Surgical History:  Procedure Laterality Date   CO2 LASER APPLICATION N/A 02/11/2013   Procedure: CO2 LASER APPLICATION;  Surgeon: Romie Levee, MD;  Location: [facility] [place];  Service: General;  Laterality: N/A;   FOOT SURGERY     HIGH RESOLUTION ANOSCOPY N/A 02/11/2013   Procedure: HIGH RESOLUTION ANOSCOPY WITH BIOPSY, LASER ABLATION;  Surgeon: Romie Levee, MD;  Location: [facility] [place];  Service: General;  Laterality: N/A;   WISDOM TOOTH EXTRACTION     Patient Active Problem List   Diagnosis Date Noted   MDD (major depressive disorder), recurrent episode, mild (HCC) 12/19/2022   Urinary frequency 12/18/2022   Left foot pain 12/18/2022   Poor social situation 12/18/2022   Snoring 01/22/2022   Encounter for physical examination related to employment 09/26/2021   Human monkeypox 04/06/2021   Cervical pain 02/07/2021   Erectile dysfunction 08/29/2020   Healthcare maintenance 04/05/2020   Foot lesion 12/24/2019   Human immunodeficiency virus  (HIV) disease (HCC) 08/25/2019   Gonorrhea 02/11/2019   Hepatitis B surface antigen positive 09/05/2018   Overweight 07/20/2015   Colitis 03/08/2015   HIV (human immunodeficiency virus infection) (HCC)    Tobacco abuse    LGV (lymphogranuloma venereum)    Primary syphilis    Chronic hepatitis B (HCC) 10/03/2014   HTN (hypertension)    Gynecomastia 04/30/2014   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 My neck feels stiff when I turn it certain ways, it won't go but so far. Sometimes when I wake up I can hardly move it. As far as my left foot, I had surgery on if for plantar fasciitis but the pain is starting to come back. The neck is worse than the foot.   PERTINENT HISTORY: HIV,  chronic hep B, neck arthritis   PAIN:  Are you having pain? Yes: NPRS scale: 7/10 Pain location: neck Pain description: stiff, dull, ache, certain movements can be sharp, sometimes get numbness in my R arm  Aggravating factors: turning my head, holding my head down for a long period of time Relieving factors: muscle relaxer sometimes   PRECAUTIONS: None  WEIGHT BEARING RESTRICTIONS: No  FALLS:  Has patient fallen in last 6 months? No  LIVING ENVIRONMENT: Lives with: lives with their family Lives in: House/apartment  OCCUPATION: Not currently working  PATIENT GOALS: work on this neck so it won't be stiff anymore  NEXT  MD VISIT: 01/16/23  OBJECTIVE:   DIAGNOSTIC FINDINGS:  Diagnostic Limited MSK Ultrasound of: Left plantar foot. Intact flexor houses longus tendon.  Slight hypoechoic fluid collecting superficial to the tendon at the area of prior surgery. 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   X-ray images cervical spine obtained today personally and independently interpreted Loss of cervical lordosis indicating spasm.  Mild degenerative changes present.  FINDINGS: No fracture, dislocation or subluxation. No spondylolisthesis. No osteolytic or osteoblastic changes. Prevertebral and cervical cranial soft tissues are unremarkable.   Calcification of the anterior annuli identified at C2-3, C4-5 and C5-6 consistent with early degenerative changes.   IMPRESSION: Early/mild degenerative changes. No acute osseous abnormalities.  COGNITION: Overall cognitive status: Within functional limits for tasks assessed     SENSATION: WFL   POSTURE: rounded shoulders  PALPATION: TTP along cervical spine C1-C7 and thoracic spine T1-T6, he jumps with light palpation and reports pain, tight in upper traps, RUT has trigger points   LOWER EXTREMITY ROM: grossly WFL   LOWER EXTREMITY MMT: grossly WFL   CERVICAL ROM:   Active ROM A/PROM (deg) eval  Flexion Chin to chest but with pain  Extension 25% with pain  Right lateral flexion Very minimal movement with pain  Left lateral flexion Minimal movement with pain  Right rotation 25% with pain  Left rotation 25% with pain   (Blank rows = not tested)   UPPER EXTREMITY ROM: grossly WFL, pain with flexion and abduction   UPPER EXTREMITY MMT: grossly 4+/5 with pain   LUMBAR ROM:  Active ROM A/PROM (deg) eval  Flexion Mid shin with pain  Extension 50% with pain  Right lateral  flexion Upper thigh with pain  Left lateral flexion Upper thigh with pain  Right rotation 50% with pain  Left rotation 50% with pain   (Blank rows = not tested)   TODAY'S TREATMENT:                                                                                                                              DATE: 12/31/22- EVAL    PATIENT EDUCATION:  Education details: POC, HEP Person educated: Patient  Education method: Explanation Education comprehension: verbalized understanding  HOME EXERCISE PROGRAM: Access Code: [email or web address] URL: [email or web address] Date: 12/31/2022 Prepared by: [name] Freer  Exercises - Seated Cervical Sidebending Stretch  - 1 x daily - 7 x weekly - 2 sets - 15 hold - Seated Assisted Cervical Rotation with Towel  - 1 x daily - 7 x weekly - 2 sets - 10 reps - Cervical Extension AROM with Strap  - 1 x daily - 7 x weekly - 2 sets - 10 reps - Standing Shoulder Row with Anchored Resistance  - 1 x daily - 7 x weekly - 2 sets - 10 reps - Shoulder extension with resistance - Neutral  - 1 x daily - 7 x weekly - 2 sets - 10 reps - Ulnar Nerve Flossing  - 1 x daily - 7 x weekly - 10 reps - Supine Sciatic Nerve Glide  - 1 x daily - 7 x weekly - 10 reps  ASSESSMENT:  CLINICAL IMPRESSION: Patient is a 47 y.o. male who was seen today for physical therapy evaluation and treatment for neck and foot pain. Today he enters with mostly pain in his neck and reports his foot hurts only if he is on it. He is very stiff and has pain with all movements especially in his neck. He has numbness and tingling that follows the ulnar nerve pattern. Was given some ulnar nerve glides in his HEP. He reports he also has a slipped disc and issues with sciatica. He reports numbness and tingling down his LLE. Also added in sciatic nerve glides to his HEP. Patient has tightness in his upper traps and is very TTP along cervical and thoracic spine. He will benefit from skilled PT to address his  pain to be able to complete his ADLS without difficulty.   OBJECTIVE IMPAIRMENTS: Abnormal gait, difficulty walking, decreased ROM, postural dysfunction, and pain.   ACTIVITY LIMITATIONS: bending, squatting, transfers, and locomotion level  PARTICIPATION LIMITATIONS: driving, shopping, community activity, and yard work  [facility] POTENTIAL: Good  CLINICAL DECISION MAKING: Stable/uncomplicated  EVALUATION COMPLEXITY: Low   GOALS: Goals reviewed with patient? Yes  SHORT TERM GOALS: Target date: 02/11/23  Patient will be independent with initial HEP.  Goal status: INITIAL  2.  Patient will report centralization of symptoms in his UE and LE   Baseline: N/T in RUE and LLE Goal status: INITIAL   LONG TERM GOALS: Target date: 03/25/23  Patient will be independent with advanced/ongoing HEP to improve outcomes and carryover.  Goal status: INITIAL  2.  Patient will report 75% improvement in neck and back pain to improve QOL.  Baseline: 7/10 Goal status: INITIAL  3.  Patient will demonstrate full pain free cervical ROM for safety with driving.  Baseline: see chart above Goal status: INITIAL  4.  Patient will demonstrate full pain free lumbar ROM  Baseline: see chart above Goal status: INITIAL  PLAN:  PT FREQUENCY: 2x/week  PT DURATION: 12 weeks  PLANNED INTERVENTIONS: Therapeutic exercises, Therapeutic activity, Neuromuscular re-education, Balance training, Gait training, Patient/Family education, Self Care, Joint mobilization, Stair training, Dry Needling, Electrical stimulation, Spinal manipulation, Spinal mobilization, Cryotherapy, Moist heat, Traction, Ultrasound, Ionotophoresis 4mg /ml Dexamethasone, and Manual therapy  PLAN FOR NEXT SESSION: STM to neck, e-stim and heat for pain in neck and low back, stretches for neck and low back, review HEP and revise if things are too hard and causing pain   [name] Freer, PT 12/31/2022, 2:00 PM

## 2023-01-08 ENCOUNTER — Ambulatory Visit: Payer: Medicaid Other | Admitting: Physical Therapy

## 2023-01-08 DIAGNOSIS — M5413 Radiculopathy, cervicothoracic region: Secondary | ICD-10-CM | POA: Diagnosis not present

## 2023-01-08 DIAGNOSIS — M542 Cervicalgia: Secondary | ICD-10-CM

## 2023-01-08 DIAGNOSIS — G8929 Other chronic pain: Secondary | ICD-10-CM

## 2023-01-08 DIAGNOSIS — M546 Pain in thoracic spine: Secondary | ICD-10-CM

## 2023-01-08 NOTE — Therapy (Signed)
OUTPATIENT PHYSICAL THERAPY LOWER EXTREMITY EVALUATION   Patient Name: ZYIER VIATOR MRN: [id number] DOB:[birth year], 47 y.o., male Today's Date: 01/08/2023  END OF SESSION:  PT End of Session - 01/08/23 1018     Visit Number 2    Date for PT Re-Evaluation 03/25/23    PT Start Time 1019    PT Stop Time 1100    PT Time Calculation (min) 41 min    Activity Tolerance Patient tolerated treatment well    Behavior During Therapy [facility] for tasks assessed/performed             Past Medical History:  Diagnosis Date   Anxiety    Arthritis    "neck" (02/16/2015)   Chronic hepatitis B (HCC)    SECONDARY TO HIV   Chronic lower back pain    Depression    Fibromyalgia    Genital warts    HIV disease (HCC) 02/28/2015   HIV infection (HCC)    followed by Dr. Luciana Axe- sees him every 4 months   Hypertension    IBS (irritable bowel syndrome)    Migraine    "none in years" (02/16/2015   Overweight 07/20/2015   Renal insufficiency 12/29/2013   Past Surgical History:  Procedure Laterality Date   CO2 LASER APPLICATION N/A 02/11/2013   Procedure: CO2 LASER APPLICATION;  Surgeon: Romie Levee, MD;  Location: [facility] [place];  Service: General;  Laterality: N/A;   FOOT SURGERY     HIGH RESOLUTION ANOSCOPY N/A 02/11/2013   Procedure: HIGH RESOLUTION ANOSCOPY WITH BIOPSY, LASER ABLATION;  Surgeon: Romie Levee, MD;  Location: [facility] [place];  Service: General;  Laterality: N/A;   WISDOM TOOTH EXTRACTION     Patient Active Problem List   Diagnosis Date Noted   MDD (major depressive disorder), recurrent episode, mild (HCC) 12/19/2022   Urinary frequency 12/18/2022   Left foot pain 12/18/2022   Poor social situation 12/18/2022   Snoring 01/22/2022   Encounter for physical examination related to employment 09/26/2021   Human monkeypox 04/06/2021   Cervical pain 02/07/2021   Erectile dysfunction 08/29/2020   Healthcare maintenance 04/05/2020   Foot lesion 12/24/2019    Human immunodeficiency virus (HIV) disease (HCC) 08/25/2019   Gonorrhea 02/11/2019   Hepatitis B surface antigen positive 09/05/2018   Overweight 07/20/2015   Colitis 03/08/2015   HIV (human immunodeficiency virus infection) (HCC)    Tobacco abuse    LGV (lymphogranuloma venereum)    Primary syphilis    Chronic hepatitis B (HCC) 10/03/2014   HTN (hypertension)    Gynecomastia 04/30/2014   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   Chronic pain associated with significant psychosocial dysfunction 09/18/2012   Fibromyalgia muscle pain 02/11/2012   Depression 12/07/2011   Anxiety 02/02/2011    PCP: No PCP  REFERRING PROVIDER: Clementeen Graham  REFERRING DIAG:  [phone] (ICD-10-CM) - Left foot pain  M54.2 (ICD-10-CM) - Cervical pain    THERAPY DIAG:  Cervicalgia  Radiculopathy, cervicothoracic region  Pain in thoracic spine  Chronic left-sided low back pain with left-sided sciatica  Rationale for Evaluation and Treatment: Rehabilitation  ONSET DATE: 12/19/22  SUBJECTIVE:   SUBJECTIVE STATEMENT: My neck and shoulder hurt. My foot not so much.  PERTINENT HISTORY: HIV, chronic hep B, neck arthritis   PAIN:  Are you having pain? Yes: NPRS scale: 9/10 Pain location: neck Pain description: stiff, dull, ache, certain movements can be sharp, sometimes get numbness in my R arm  Aggravating factors: turning my head, holding my head down  for a long period of time Relieving factors: muscle relaxer sometimes   PRECAUTIONS: None  WEIGHT BEARING RESTRICTIONS: No  FALLS:  Has patient fallen in last 6 months? No  LIVING ENVIRONMENT: Lives with: lives with their family Lives in: House/apartment  OCCUPATION: Not currently working  PATIENT GOALS: work on this neck so it won't be stiff anymore  NEXT MD VISIT: 01/16/23  OBJECTIVE:   DIAGNOSTIC FINDINGS:  Diagnostic Limited MSK Ultrasound of: Left plantar foot. Intact flexor houses longus tendon.  Slight hypoechoic fluid collecting  superficial to the tendon at the area of prior surgery. 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   X-ray images cervical spine obtained today personally and independently interpreted Loss of cervical lordosis indicating spasm.  Mild degenerative changes present.  FINDINGS: No fracture, dislocation or subluxation. No spondylolisthesis. No osteolytic or osteoblastic changes. Prevertebral and cervical cranial soft tissues are unremarkable.   Calcification of the anterior annuli identified at C2-3, C4-5 and C5-6 consistent with early degenerative changes.   IMPRESSION: Early/mild degenerative changes. No acute osseous abnormalities.  COGNITION: Overall cognitive status: Within functional limits for tasks assessed     SENSATION: WFL   POSTURE: rounded shoulders  PALPATION: TTP along cervical spine C1-C7 and thoracic spine T1-T6, he jumps with light palpation and reports pain, tight in upper traps, RUT has trigger points   LOWER EXTREMITY ROM: grossly WFL   LOWER EXTREMITY MMT: grossly WFL   CERVICAL ROM:   Active ROM A/PROM (deg) eval  Flexion Chin to chest but with pain  Extension 25% with pain  Right lateral flexion Very minimal movement with pain  Left lateral flexion Minimal movement with pain  Right rotation 25% with pain  Left rotation 25% with pain   (Blank rows = not tested)   UPPER EXTREMITY ROM: grossly WFL, pain with flexion and abduction   UPPER EXTREMITY MMT: grossly 4+/5 with pain   LUMBAR ROM:  Active ROM A/PROM (deg) eval  Flexion Mid shin with pain  Extension 50% with pain  Right lateral flexion Upper thigh with pain  Left lateral flexion Upper thigh with pain  Right rotation 50% with pain  Left rotation 50% with pain   (Blank rows = not tested)   TODAY'S TREATMENT:                                                                                                                               DATE:   01/08/23 UBE L1 3 mins each way Trunk ext black t-band 2x10 Trunk flex black t-band x5; dropped t-band due to pain and did x10 ROM Sitting trunk flexion/rotation/lateral flexion stretch x2 ESTIM IFC 10 mins with MHP to low back supine   12/31/22- EVAL    PATIENT EDUCATION:  Education details: POC, HEP Person educated: Patient Education  method: Explanation Education comprehension: verbalized understanding  HOME EXERCISE PROGRAM: Access Code: [email or web address] URL: [email or web address] Date: 12/31/2022 Prepared by: [name] Freer  Exercises - Seated Cervical Sidebending Stretch  - 1 x daily - 7 x weekly - 2 sets - 15 hold - Seated Assisted Cervical Rotation with Towel  - 1 x daily - 7 x weekly - 2 sets - 10 reps - Cervical Extension AROM with Strap  - 1 x daily - 7 x weekly - 2 sets - 10 reps - Standing Shoulder Row with Anchored Resistance  - 1 x daily - 7 x weekly - 2 sets - 10 reps - Shoulder extension with resistance - Neutral  - 1 x daily - 7 x weekly - 2 sets - 10 reps - Ulnar Nerve Flossing  - 1 x daily - 7 x weekly - 10 reps - Supine Sciatic Nerve Glide  - 1 x daily - 7 x weekly - 10 reps  ASSESSMENT:  CLINICAL IMPRESSION: Patient entered in a lot of neck and back pain. Said the HEP is going okay and the seated assisted cervical rotation with towel is painful when going to the right, so I advised him to continue if it is tolerable. Treatment went slowly due to pain causing him to have slower movements, so we focused on stretches for low back and trunk, which caused him pain. Did ESTIM to his low back which gave him some relief and want to check in how that felt long term during next session.  OBJECTIVE IMPAIRMENTS: Abnormal gait, difficulty walking, decreased ROM, postural dysfunction, and pain.   ACTIVITY LIMITATIONS: bending, squatting,  transfers, and locomotion level  PARTICIPATION LIMITATIONS: driving, shopping, community activity, and yard work  [facility] POTENTIAL: Good  CLINICAL DECISION MAKING: Stable/uncomplicated  EVALUATION COMPLEXITY: Low   GOALS: Goals reviewed with patient? Yes  SHORT TERM GOALS: Target date: 02/11/23  Patient will be independent with initial HEP.  Goal status: INITIAL  2.  Patient will report centralization of symptoms in his UE and LE   Baseline: N/T in RUE and LLE Goal status: INITIAL   LONG TERM GOALS: Target date: 03/25/23  Patient will be independent with advanced/ongoing HEP to improve outcomes and carryover.  Goal status: INITIAL  2.  Patient will report 75% improvement in neck and back pain to improve QOL.  Baseline: 7/10 Goal status: INITIAL  3.  Patient will demonstrate full pain free cervical ROM for safety with driving.  Baseline: see chart above Goal status: INITIAL  4.  Patient will demonstrate full pain free lumbar ROM  Baseline: see chart above Goal status: INITIAL  PLAN:  PT FREQUENCY: 2x/week  PT DURATION: 12 weeks  PLANNED INTERVENTIONS: Therapeutic exercises, Therapeutic activity, Neuromuscular re-education, Balance training, Gait training, Patient/Family education, Self Care, Joint mobilization, Stair training, Dry Needling, Electrical stimulation, Spinal manipulation, Spinal mobilization, Cryotherapy, Moist heat, Traction, Ultrasound, Ionotophoresis 4mg /ml Dexamethasone, and Manual therapy  PLAN FOR NEXT SESSION: check how estim went and if he had continued pain relief. Continue with stretching and ROM.   Faruq Rosenberger,SPTA 01/08/2023, 10:21 AM [place]

## 2023-01-08 NOTE — Therapy (Signed)
OUTPATIENT PHYSICAL THERAPY LOWER EXTREMITY TREATMENT   Patient Name: MALAKY TETRAULT MRN: [id number] DOB:Oct 01, 1975, 47 y.o., male Today's Date: 01/08/2023  END OF SESSION:  PT End of Session - 01/08/23 1018     Visit Number 2    Date for PT Re-Evaluation 03/25/23    PT Start Time 1019    PT Stop Time 1100    PT Time Calculation (min) 41 min    Activity Tolerance Patient tolerated treatment well    Behavior During Therapy [facility] for tasks assessed/performed             Past Medical History:  Diagnosis Date   Anxiety    Arthritis    "neck" (02/16/2015)   Chronic hepatitis B (HCC)    SECONDARY TO HIV   Chronic lower back pain    Depression    Fibromyalgia    Genital warts    HIV disease (HCC) 02/28/2015   HIV infection (HCC)    followed by Dr. Luciana Axe- sees him every 4 months   Hypertension    IBS (irritable bowel syndrome)    Migraine    "none in years" (02/16/2015   Overweight 07/20/2015   Renal insufficiency 12/29/2013   Past Surgical History:  Procedure Laterality Date   CO2 LASER APPLICATION N/A 02/11/2013   Procedure: CO2 LASER APPLICATION;  Surgeon: Romie Levee, MD;  Location: [facility];  Service: General;  Laterality: N/A;   FOOT SURGERY     HIGH RESOLUTION ANOSCOPY N/A 02/11/2013   Procedure: HIGH RESOLUTION ANOSCOPY WITH BIOPSY, LASER ABLATION;  Surgeon: Romie Levee, MD;  Location: [facility];  Service: General;  Laterality: N/A;   WISDOM TOOTH EXTRACTION     Patient Active Problem List   Diagnosis Date Noted   MDD (major depressive disorder), recurrent episode, mild (HCC) 12/19/2022   Urinary frequency 12/18/2022   Left foot pain 12/18/2022   Poor social situation 12/18/2022   Snoring 01/22/2022   Encounter for physical examination related to employment 09/26/2021   Human monkeypox 04/06/2021   Cervical pain 02/07/2021   Erectile dysfunction 08/29/2020   Healthcare maintenance 04/05/2020   Foot lesion 12/24/2019    Human immunodeficiency virus (HIV) disease (HCC) 08/25/2019   Gonorrhea 02/11/2019   Hepatitis B surface antigen positive 09/05/2018   Overweight 07/20/2015   Colitis 03/08/2015   HIV (human immunodeficiency virus infection) (HCC)    Tobacco abuse    LGV (lymphogranuloma venereum)    Primary syphilis    Chronic hepatitis B (HCC) 10/03/2014   HTN (hypertension)    Gynecomastia 04/30/2014   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   Chronic pain associated with significant psychosocial dysfunction 09/18/2012   Fibromyalgia muscle pain 02/11/2012   Depression 12/07/2011   Anxiety 02/02/2011    PCP: No PCP  REFERRING PROVIDER: Clementeen Graham  REFERRING DIAG:  [phone] (ICD-10-CM) - Left foot pain  M54.2 (ICD-10-CM) - Cervical pain    THERAPY DIAG:  Cervicalgia  Radiculopathy, cervicothoracic region  Pain in thoracic spine  Chronic left-sided low back pain with left-sided sciatica  Rationale for Evaluation and Treatment: Rehabilitation  ONSET DATE: 12/19/22  SUBJECTIVE:   SUBJECTIVE STATEMENT: My neck and shoulder hurt. My foot not so much.  PERTINENT HISTORY: HIV, chronic hep B, neck arthritis   PAIN:  Are you having pain? Yes: NPRS scale: 9/10 Pain location: neck Pain description: stiff, dull, ache, certain movements can be sharp, sometimes get numbness in my R arm  Aggravating factors: turning my head, holding my head down  for a long period of time Relieving factors: muscle relaxer sometimes   PRECAUTIONS: None  WEIGHT BEARING RESTRICTIONS: No  FALLS:  Has patient fallen in last 6 months? No  LIVING ENVIRONMENT: Lives with: lives with their family Lives in: House/apartment  OCCUPATION: Not currently working  PATIENT GOALS: work on this neck so it won't be stiff anymore  NEXT MD VISIT: 01/16/23  OBJECTIVE:   DIAGNOSTIC FINDINGS:  Diagnostic Limited MSK Ultrasound of: Left plantar foot. Intact flexor houses longus tendon.  Slight hypoechoic fluid collecting  superficial to the tendon at the area of prior surgery. 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   X-ray images cervical spine obtained today personally and independently interpreted Loss of cervical lordosis indicating spasm.  Mild degenerative changes present.  FINDINGS: No fracture, dislocation or subluxation. No spondylolisthesis. No osteolytic or osteoblastic changes. Prevertebral and cervical cranial soft tissues are unremarkable.   Calcification of the anterior annuli identified at C2-3, C4-5 and C5-6 consistent with early degenerative changes.   IMPRESSION: Early/mild degenerative changes. No acute osseous abnormalities.  COGNITION: Overall cognitive status: Within functional limits for tasks assessed     SENSATION: WFL   POSTURE: rounded shoulders  PALPATION: TTP along cervical spine C1-C7 and thoracic spine T1-T6, he jumps with light palpation and reports pain, tight in upper traps, RUT has trigger points   LOWER EXTREMITY ROM: grossly WFL   LOWER EXTREMITY MMT: grossly WFL   CERVICAL ROM:   Active ROM A/PROM (deg) eval  Flexion Chin to chest but with pain  Extension 25% with pain  Right lateral flexion Very minimal movement with pain  Left lateral flexion Minimal movement with pain  Right rotation 25% with pain  Left rotation 25% with pain   (Blank rows = not tested)   UPPER EXTREMITY ROM: grossly WFL, pain with flexion and abduction   UPPER EXTREMITY MMT: grossly 4+/5 with pain   LUMBAR ROM:  Active ROM A/PROM (deg) eval  Flexion Mid shin with pain  Extension 50% with pain  Right lateral flexion Upper thigh with pain  Left lateral flexion Upper thigh with pain  Right rotation 50% with pain  Left rotation 50% with pain   (Blank rows = not tested)   TODAY'S TREATMENT:                                                                                                                               DATE:  01/10/23 NuStep Seated STM to neck and passive stretching  Trunk rotations  Bridges MH and IFC to low back a MH to neck     01/08/23 UBE L1 3 mins each way Trunk ext black t-band 2x10 Trunk flex black t-band x5; dropped t-band due to pain and did x10 ROM Sitting trunk flexion/rotation/lateral flexion stretch x2 ESTIM  IFC 10 mins with MHP to low back supine   12/31/22- EVAL    PATIENT EDUCATION:  Education details: POC, HEP Person educated: Patient Education method: Explanation Education comprehension: verbalized understanding  HOME EXERCISE PROGRAM: Access Code: [email or web address] URL: [email or web address] Date: 12/31/2022 Prepared by: [name] Freer  Exercises - Seated Cervical Sidebending Stretch  - 1 x daily - 7 x weekly - 2 sets - 15 hold - Seated Assisted Cervical Rotation with Towel  - 1 x daily - 7 x weekly - 2 sets - 10 reps - Cervical Extension AROM with Strap  - 1 x daily - 7 x weekly - 2 sets - 10 reps - Standing Shoulder Row with Anchored Resistance  - 1 x daily - 7 x weekly - 2 sets - 10 reps - Shoulder extension with resistance - Neutral  - 1 x daily - 7 x weekly - 2 sets - 10 reps - Ulnar Nerve Flossing  - 1 x daily - 7 x weekly - 10 reps - Supine Sciatic Nerve Glide  - 1 x daily - 7 x weekly - 10 reps  ASSESSMENT:  CLINICAL IMPRESSION: Patient entered in a lot of neck and back pain. Said the HEP is going okay and the seated assisted cervical rotation with towel is painful when going to the right, so I advised him to continue if it is tolerable. Treatment went slowly due to pain causing him to have slower movements, so we focused on stretches for low back and trunk, which caused him pain. Did ESTIM to his low back which gave him some relief and want to check in how that felt long term during next session.  OBJECTIVE IMPAIRMENTS:  Abnormal gait, difficulty walking, decreased ROM, postural dysfunction, and pain.   ACTIVITY LIMITATIONS: bending, squatting, transfers, and locomotion level  PARTICIPATION LIMITATIONS: driving, shopping, community activity, and yard work  [facility] POTENTIAL: Good  CLINICAL DECISION MAKING: Stable/uncomplicated  EVALUATION COMPLEXITY: Low   GOALS: Goals reviewed with patient? Yes  SHORT TERM GOALS: Target date: 02/11/23  Patient will be independent with initial HEP.  Goal status: INITIAL  2.  Patient will report centralization of symptoms in his UE and LE   Baseline: N/T in RUE and LLE Goal status: INITIAL   LONG TERM GOALS: Target date: 03/25/23  Patient will be independent with advanced/ongoing HEP to improve outcomes and carryover.  Goal status: INITIAL  2.  Patient will report 75% improvement in neck and back pain to improve QOL.  Baseline: 7/10 Goal status: INITIAL  3.  Patient will demonstrate full pain free cervical ROM for safety with driving.  Baseline: see chart above Goal status: INITIAL  4.  Patient will demonstrate full pain free lumbar ROM  Baseline: see chart above Goal status: INITIAL  PLAN:  PT FREQUENCY: 2x/week  PT DURATION: 12 weeks  PLANNED INTERVENTIONS: Therapeutic exercises, Therapeutic activity, Neuromuscular re-education, Balance training, Gait training, Patient/Family education, Self Care, Joint mobilization, Stair training, Dry Needling, Electrical stimulation, Spinal manipulation, Spinal mobilization, Cryotherapy, Moist heat, Traction, Ultrasound, Ionotophoresis 4mg /ml Dexamethasone, and Manual therapy  PLAN FOR NEXT SESSION: check how estim went and if he had continued pain relief. Continue with stretching and ROM.   Olivia Knight,SPTA 01/08/2023, 10:21 AM [place]

## 2023-01-10 ENCOUNTER — Ambulatory Visit: Payer: Medicaid Other

## 2023-01-10 DIAGNOSIS — G8929 Other chronic pain: Secondary | ICD-10-CM

## 2023-01-10 DIAGNOSIS — M546 Pain in thoracic spine: Secondary | ICD-10-CM

## 2023-01-10 DIAGNOSIS — M542 Cervicalgia: Secondary | ICD-10-CM

## 2023-01-10 DIAGNOSIS — M5413 Radiculopathy, cervicothoracic region: Secondary | ICD-10-CM | POA: Diagnosis not present

## 2023-01-15 ENCOUNTER — Ambulatory Visit: Payer: Medicaid Other | Admitting: Physical Therapy

## 2023-01-15 ENCOUNTER — Telehealth: Payer: Self-pay | Admitting: Neurology

## 2023-01-15 DIAGNOSIS — M5413 Radiculopathy, cervicothoracic region: Secondary | ICD-10-CM | POA: Diagnosis not present

## 2023-01-15 DIAGNOSIS — G8929 Other chronic pain: Secondary | ICD-10-CM

## 2023-01-15 DIAGNOSIS — M542 Cervicalgia: Secondary | ICD-10-CM

## 2023-01-15 DIAGNOSIS — M546 Pain in thoracic spine: Secondary | ICD-10-CM

## 2023-01-15 NOTE — Therapy (Signed)
OUTPATIENT PHYSICAL THERAPY LOWER EXTREMITY TREATMENT   Patient Name: Phillip Gill MRN: [id number] DOB:03-Sep-1975, 47 y.o., male Today's Date: 01/15/2023  END OF SESSION:  PT End of Session - 01/15/23 1020     Visit Number 4    Date for PT Re-Evaluation 03/25/23    PT Start Time 1020    PT Stop Time 1100    PT Time Calculation (min) 40 min    Activity Tolerance Patient tolerated treatment well    Behavior During Therapy [facility] for tasks assessed/performed             Past Medical History:  Diagnosis Date   Anxiety    Arthritis    "neck" (02/16/2015)   Chronic hepatitis B (HCC)    SECONDARY TO HIV   Chronic lower back pain    Depression    Fibromyalgia    Genital warts    HIV disease (HCC) 02/28/2015   HIV infection (HCC)    followed by Dr. Luciana Axe- sees him every 4 months   Hypertension    IBS (irritable bowel syndrome)    Migraine    "none in years" (02/16/2015   Overweight 07/20/2015   Renal insufficiency 12/29/2013   Past Surgical History:  Procedure Laterality Date   CO2 LASER APPLICATION N/A 02/11/2013   Procedure: CO2 LASER APPLICATION;  Surgeon: Romie Levee, MD;  Location: [facility];  Service: General;  Laterality: N/A;   FOOT SURGERY     HIGH RESOLUTION ANOSCOPY N/A 02/11/2013   Procedure: HIGH RESOLUTION ANOSCOPY WITH BIOPSY, LASER ABLATION;  Surgeon: Romie Levee, MD;  Location: [facility];  Service: General;  Laterality: N/A;   WISDOM TOOTH EXTRACTION     Patient Active Problem List   Diagnosis Date Noted   MDD (major depressive disorder), recurrent episode, mild (HCC) 12/19/2022   Urinary frequency 12/18/2022   Left foot pain 12/18/2022   Poor social situation 12/18/2022   Snoring 01/22/2022   Encounter for physical examination related to employment 09/26/2021   Human monkeypox 04/06/2021   Cervical pain 02/07/2021   Erectile dysfunction 08/29/2020   Healthcare maintenance 04/05/2020   Foot lesion 12/24/2019    Human immunodeficiency virus (HIV) disease (HCC) 08/25/2019   Gonorrhea 02/11/2019   Hepatitis B surface antigen positive 09/05/2018   Overweight 07/20/2015   Colitis 03/08/2015   HIV (human immunodeficiency virus infection) (HCC)    Tobacco abuse    LGV (lymphogranuloma venereum)    Primary syphilis    Chronic hepatitis B (HCC) 10/03/2014   HTN (hypertension)    Gynecomastia 04/30/2014   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   Chronic pain associated with significant psychosocial dysfunction 09/18/2012   Fibromyalgia muscle pain 02/11/2012   Depression 12/07/2011   Anxiety 02/02/2011    PCP: No PCP  REFERRING PROVIDER: Clementeen Graham  REFERRING DIAG:  [phone] (ICD-10-CM) - Left foot pain  M54.2 (ICD-10-CM) - Cervical pain    THERAPY DIAG:  Cervicalgia  Radiculopathy, cervicothoracic region  Pain in thoracic spine  Chronic left-sided low back pain with left-sided sciatica  Rationale for Evaluation and Treatment: Rehabilitation  ONSET DATE: 12/19/22  SUBJECTIVE:   SUBJECTIVE STATEMENT: Hanging in there - pain, pain pain  PERTINENT HISTORY: HIV, chronic hep B, neck arthritis   PAIN:  Are you having pain? Yes: NPRS scale: 8.5/10 Pain location: foot Pain description: stiff, dull, ache, certain movements can be sharp, sometimes get numbness in my R arm, needles in foot  Aggravating factors: turning my head, holding my head down  for a long period of time Relieving factors: muscle relaxer sometimes   PRECAUTIONS: None  WEIGHT BEARING RESTRICTIONS: No  FALLS:  Has patient fallen in last 6 months? No  LIVING ENVIRONMENT: Lives with: lives with their family Lives in: House/apartment  OCCUPATION: Not currently working  PATIENT GOALS: work on this neck so it won't be stiff anymore  NEXT MD VISIT: 01/16/23  OBJECTIVE:   DIAGNOSTIC FINDINGS:  Diagnostic Limited MSK Ultrasound of: Left plantar foot. Intact flexor houses longus tendon.  Slight hypoechoic fluid collecting  superficial to the tendon at the area of prior surgery. 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   X-ray images cervical spine obtained today personally and independently interpreted Loss of cervical lordosis indicating spasm.  Mild degenerative changes present.  FINDINGS: No fracture, dislocation or subluxation. No spondylolisthesis. No osteolytic or osteoblastic changes. Prevertebral and cervical cranial soft tissues are unremarkable.   Calcification of the anterior annuli identified at C2-3, C4-5 and C5-6 consistent with early degenerative changes.   IMPRESSION: Early/mild degenerative changes. No acute osseous abnormalities.  COGNITION: Overall cognitive status: Within functional limits for tasks assessed     SENSATION: WFL   POSTURE: rounded shoulders  PALPATION: TTP along cervical spine C1-C7 and thoracic spine T1-T6, he jumps with light palpation and reports pain, tight in upper traps, RUT has trigger points   LOWER EXTREMITY ROM: grossly WFL   LOWER EXTREMITY MMT: grossly WFL   CERVICAL ROM:   Active ROM A/PROM (deg) eval  Flexion Chin to chest but with pain  Extension 25% with pain  Right lateral flexion Very minimal movement with pain  Left lateral flexion Minimal movement with pain  Right rotation 25% with pain  Left rotation 25% with pain   (Blank rows = not tested)   UPPER EXTREMITY ROM: grossly WFL, pain with flexion and abduction   UPPER EXTREMITY MMT: grossly 4+/5 with pain   LUMBAR ROM:  Active ROM A/PROM (deg) eval  Flexion Mid shin with pain  Extension 50% with pain  Right lateral flexion Upper thigh with pain  Left lateral flexion Upper thigh with pain  Right rotation 50% with pain  Left rotation 50% with pain   (Blank rows = not tested)   TODAY'S TREATMENT:                                                                                                                               DATE:   01/15/23 PF stretch x2 Black bar PF x3 (pt unable to continue due to pain) NuStep L4 x 3 mins UE and LE; x3 mins LE only Seated tennis ball rolls STM to plantar surface of L foot CP to plantar surface of L foot Standing ext/rows yellow t-band x10; high back pain level Standing bacl flex stretch (min ROM  and increased pain) Standing back ext stretch Seated back flex stretch (good ROM and decreased pain) Seated trunk rotation stretch   01/10/23 UBE L1 x48mins each way  Seated STM to neck and passive stretching  Pball rotations and flexion x10 MH and IFC to low back  MH to neck    01/08/23 UBE L1 3 mins each way Trunk ext black t-band 2x10 Trunk flex black t-band x5; dropped t-band due to pain and did x10 ROM Sitting trunk flexion/rotation/lateral flexion stretch x2 ESTIM IFC 10 mins with MHP to low back supine   12/31/22- EVAL    PATIENT EDUCATION:  Education details: POC, HEP Person educated: Patient Education method: Explanation Education comprehension: verbalized understanding  HOME EXERCISE PROGRAM: Access Code: [email or web address] URL: [email or web address] Date: 12/31/2022 Prepared by: [name] Freer  Exercises - Seated Cervical Sidebending Stretch  - 1 x daily - 7 x weekly - 2 sets - 15 hold - Seated Assisted Cervical Rotation with Towel  - 1 x daily - 7 x weekly - 2 sets - 10 reps - Cervical Extension AROM with Strap  - 1 x daily - 7 x weekly - 2 sets - 10 reps - Standing Shoulder Row with Anchored Resistance  - 1 x daily - 7 x weekly - 2 sets - 10 reps - Shoulder extension with resistance - Neutral  - 1 x daily - 7 x weekly - 2 sets - 10 reps - Ulnar Nerve Flossing  - 1 x daily - 7 x weekly - 10 reps - Supine Sciatic Nerve Glide  - 1 x daily - 7 x weekly - 10 reps  ASSESSMENT:  CLINICAL IMPRESSION: Patient entered  in a lot of foot pain this morning. He said he is still having pain in his neck and back but his foot pain is the most bothersome. Pt felt pain during all exercises, but during black bar PF it was intolerable. Using his UE with the NuStep began to cause him pain, so switched to LE only which helped. Massage to the medial plantar surface of his L foot caused the most pain. We followed up with ice which gave him relief, but his back began causing him pain, so we ended with some low level back strengthening and stretches which he was able to tolerate. Standing trunk flexion had very limited ROM and caused high pain, but was able to get more range along with less pain when performed in sitting. Patient said he is going to check with his doctor about getting a TENS unit.   OBJECTIVE IMPAIRMENTS: Abnormal gait, difficulty walking, decreased ROM, postural dysfunction, and pain.   ACTIVITY LIMITATIONS: bending, squatting, transfers, and locomotion level  PARTICIPATION LIMITATIONS: driving, shopping, community activity, and yard work  [facility] POTENTIAL: Good  CLINICAL DECISION MAKING: Stable/uncomplicated  EVALUATION COMPLEXITY: Low   GOALS: Goals reviewed with patient? Yes  SHORT TERM GOALS: Target date: 02/11/23  Patient will be independent with initial HEP.  Goal status: INITIAL  2.  Patient will report centralization of symptoms in his UE and LE   Baseline: N/T in RUE and LLE Goal status: INITIAL   LONG TERM GOALS: Target date: 03/25/23  Patient will be independent with advanced/ongoing HEP to improve outcomes and carryover.  Goal status: INITIAL  2.  Patient will report 75% improvement in neck and back pain to improve QOL.  Baseline: 7/10 Goal status: INITIAL  3.  Patient will demonstrate full pain free cervical ROM for safety with driving.  Baseline: see  chart above Goal status: INITIAL  4.  Patient will demonstrate full pain free lumbar ROM  Baseline: see chart above Goal  status: INITIAL  PLAN:  PT FREQUENCY: 2x/week  PT DURATION: 12 weeks  PLANNED INTERVENTIONS: Therapeutic exercises, Therapeutic activity, Neuromuscular re-education, Balance training, Gait training, Patient/Family education, Self Care, Joint mobilization, Stair training, Dry Needling, Electrical stimulation, Spinal manipulation, Spinal mobilization, Cryotherapy, Moist heat, Traction, Ultrasound, Ionotophoresis 4mg /ml Dexamethasone, and Manual therapy  PLAN FOR NEXT SESSION: ASSESS GOALS   Roslind Michaux,SPTA 01/15/2023, 10:20 AM

## 2023-01-15 NOTE — Telephone Encounter (Signed)
I called [facility] to see if they could update me on pt's account.  I was told the compliance department had attempted to reach the patient multiple times because he was never considered compliant with his usage.  In January he was 43%, February 57%, by March she had dropped down to 40% and then by mid March he was at 27%.  They said insurance stopped paying for the machine. They sent him a bill and he is now in collections.  If he wants to continue using the machine he is able to pay the balance but the representative at [facility] did not know for sure if insurance would pay for supplies going forward.  His other option is to restart the process which would involve an appointment here plus another sleep study.   I attempted to call the patient back. I LVM with office number asking for call back.

## 2023-01-15 NOTE — Telephone Encounter (Signed)
Pt called to make initial CPAP appointment. Pt was supposed to initial CPAP appointment 07/09/22- 3/8/-24. Pt requesting a call back from nurse to discuss.

## 2023-01-15 NOTE — Therapy (Signed)
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 Luciana Axe- sees him every 4 months   Hypertension    IBS (irritable bowel syndrome)    Migraine    "none in years" (02/16/2015   Overweight 07/20/2015   Renal insufficiency 12/29/2013   Past Surgical History:  Procedure Laterality Date   CO2 LASER APPLICATION N/A 02/11/2013   Procedure: CO2 LASER APPLICATION;  Surgeon: Romie Levee, MD;  Location: [facility] [place];  Service: General;  Laterality: N/A;   FOOT SURGERY     HIGH RESOLUTION ANOSCOPY N/A 02/11/2013   Procedure: HIGH RESOLUTION ANOSCOPY WITH BIOPSY, LASER ABLATION;  Surgeon: Romie Levee, MD;  Location: [facility] [place];  Service: General;  Laterality: N/A;   WISDOM TOOTH EXTRACTION     Patient Active Problem List   Diagnosis Date Noted   MDD (major depressive disorder), recurrent episode, mild (HCC) 12/19/2022   Urinary frequency 12/18/2022   Left foot pain 12/18/2022   Poor social situation 12/18/2022   Snoring 01/22/2022   Encounter for physical examination related to employment 09/26/2021   Human monkeypox 04/06/2021   Cervical pain 02/07/2021   Erectile dysfunction 08/29/2020   Healthcare maintenance 04/05/2020   Foot lesion  12/24/2019   Human immunodeficiency virus (HIV) disease (HCC) 08/25/2019   Gonorrhea 02/11/2019   Hepatitis B surface antigen positive 09/05/2018   Overweight 07/20/2015   Colitis 03/08/2015   HIV (human immunodeficiency virus infection) (HCC)    Tobacco abuse    LGV (lymphogranuloma venereum)    Primary syphilis    Chronic hepatitis B (HCC) 10/03/2014   HTN (hypertension)    Gynecomastia 04/30/2014   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   Chronic pain associated with significant psychosocial dysfunction 09/18/2012   Fibromyalgia muscle pain 02/11/2012   Depression 12/07/2011   Anxiety 02/02/2011    PCP: No PCP  REFERRING PROVIDER: Clementeen Graham  REFERRING DIAG:  [id number] (ICD-10-CM) - Left foot pain  M54.2 (ICD-10-CM) - Cervical pain    THERAPY DIAG:  Cervicalgia  Radiculopathy, cervicothoracic region  Pain in thoracic spine  Chronic left-sided low back pain with left-sided sciatica  Rationale for Evaluation and Treatment: Rehabilitation  ONSET DATE: 12/19/22  SUBJECTIVE:   SUBJECTIVE STATEMENT: The back is killing me today. Some mild pain in my feet and my neck still feels stiff. Got a prescription from the sports med doctor.   PERTINENT HISTORY: HIV, chronic hep B, neck arthritis   PAIN:  Are you having pain? Yes: NPRS scale: 8/10 Pain location: foot Pain description: stiff, dull, ache, certain movements can be  sharp, sometimes get numbness in my R arm, needles in foot  Aggravating factors: turning my head, holding my head down for a long period of time Relieving factors: muscle relaxer sometimes   PRECAUTIONS: None  WEIGHT BEARING RESTRICTIONS: No  FALLS:  Has patient fallen in last 6 months? No  LIVING ENVIRONMENT: Lives with: lives with their family Lives in: House/apartment  OCCUPATION: Not currently working  PATIENT GOALS: work on this neck so it won't be stiff anymore  NEXT MD VISIT: 01/16/23  OBJECTIVE:   DIAGNOSTIC FINDINGS:  Diagn[facility]  MSK Ultrasound of: Left plantar foot. Intact flexor houses longus tendon.  Slight hypoechoic fluid collecting superficial to the tendon at the area of prior surgery. 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   X-ray images cervical spine obtained today personally and independently interpreted Loss of cervical lordosis indicating spasm.  Mild degenerative changes present.  FINDINGS: No fracture, dislocation or subluxation. No spondylolisthesis. No osteolytic or osteoblastic changes. Prevertebral and cervical cranial soft tissues are unremarkable.   Calcification of the anterior annuli identified at C2-3, C4-5 and C5-6 consistent with early degenerative changes.   IMPRESSION: Early/mild degenerative changes. No acute osseous abnormalities.  COGNITION: Overall cognitive status: Within functional limits for tasks assessed     SENSATION: WFL   POSTURE: rounded shoulders  PALPATION: TTP along cervical spine C1-C7 and thoracic spine T1-T6, he jumps with light palpation and reports pain, tight in upper traps, RUT has trigger points   LOWER EXTREMITY ROM: grossly WFL   LOWER EXTREMITY MMT: grossly WFL   CERVICAL ROM:   Active ROM A/PROM (deg) eval  Flexion Chin to chest but with pain  Extension 25% with pain  Right lateral flexion Very minimal movement with pain  Left lateral flexion Minimal movement with pain  Right rotation 25% with pain  Left rotation 25% with pain   (Blank rows = not tested)   UPPER EXTREMITY ROM: grossly WFL, pain with flexion and abduction   UPPER EXTREMITY MMT: grossly 4+/5 with pain   LUMBAR ROM:  Active ROM A/PROM (deg) eval  Flexion Mid shin with pain  Extension 50% with pain  Right lateral flexion Upper thigh with pain  Left lateral flexion Upper thigh with pain   Right rotation 50% with pain  Left rotation 50% with pain   (Blank rows = not tested)   TODAY'S TREATMENT:                                                                                                                              DATE:  01/17/23 NuStep L1 x53mins  UBE L1 x2 mins each way  Calf stretch 30s  Standing shoulder flexion with dowel against wall 2x5 Gentle STM to thoracic and low back  Grade 1-2 PA mobs  MH and IFC  x10 mins to LB   01/15/23 PF stretch x2 Black bar PF x3 (pt unable to continue due to pain) NuStep L4 x 3 mins UE and LE; x3 mins LE only Seated tennis ball rolls STM to plantar surface of L foot CP to plantar surface of L foot Standing ext/rows yellow t-band x10; high back pain level Standing bacl flex stretch (min ROM and increased pain) Standing back ext stretch Seated back flex stretch (good ROM and decreased pain) Seated trunk rotation stretch   01/10/23 UBE L1 x85mins each way  Seated STM to neck and passive stretching  Pball rotations and flexion x10 MH and IFC to low back  MH to neck    01/08/23 UBE L1 3 mins each way Trunk ext black t-band 2x10 Trunk flex black t-band x5; dropped t-band due to pain and did x10 ROM Sitting trunk flexion/rotation/lateral flexion stretch x2 ESTIM IFC 10 mins with MHP to low back supine   12/31/22- EVAL    PATIENT EDUCATION:  Education details: POC, HEP Person educated: Patient Education method: Explanation Education comprehension: verbalized understanding  HOME EXERCISE PROGRAM: Access Code: [email or web address] URL: [email or web address] Date: 12/31/2022 Prepared by: [name] Freer  Exercises - Seated Cervical Sidebending Stretch  - 1 x daily - 7 x weekly - 2 sets - 15 hold - Seated Assisted Cervical Rotation with Towel  - 1 x daily - 7 x weekly - 2 sets - 10 reps - Cervical Extension AROM with Strap  - 1 x daily - 7 x weekly - 2 sets - 10 reps - Standing Shoulder Row with Anchored  Resistance  - 1 x daily - 7 x weekly - 2 sets - 10 reps - Shoulder extension with resistance - Neutral  - 1 x daily - 7 x weekly - 2 sets - 10 reps - Ulnar Nerve Flossing  - 1 x daily - 7 x weekly - 10 reps - Supine Sciatic Nerve Glide  - 1 x daily - 7 x weekly - 10 reps  ASSESSMENT:  CLINICAL IMPRESSION: Patient continues to have high pain levels. Today he reports it is mostly in his low back. He is slow and has pain with small movements and walking with rigid and antalgic pattern. We tried to do whatever we could to just get him moving as much as possible. He is sensitive with light touch and pressure, has some jump signs with spinal mobilizations and STM. Noted tightness along his paraspinal muscles. His x-rays only show mild degenerative changes but his pain and symptom presentation are significantly worse. He was given a prescription for a TENS unit yesterday but is unsure how his insurance is supposed to pay for it. I looked up a website that might be able to help him out, will check next visit to see if there is any progress.   OBJECTIVE IMPAIRMENTS: Abnormal gait, difficulty walking, decreased ROM, postural dysfunction, and pain.   ACTIVITY LIMITATIONS: bending, squatting, transfers, and locomotion level  PARTICIPATION LIMITATIONS: driving, shopping, community activity, and yard work  [facility] POTENTIAL: Good  CLINICAL DECISION MAKING: Stable/uncomplicated  EVALUATION COMPLEXITY: Low   GOALS: Goals reviewed with patient? Yes  SHORT TERM GOALS: Target date: 02/11/23  Patient will be independent with initial HEP.  Goal status: INITIAL  2.  Patient will report centralization of symptoms in his UE and LE   Baseline: N/T in RUE and LLE Goal status: INITIAL   LONG TERM GOALS: Target date: 03/25/23  Patient will be independent with advanced/ongoing  HEP to improve outcomes and carryover.  Goal status: INITIAL  2.  Patient will report 75% improvement in neck and back pain to  improve QOL.  Baseline: 7/10 Goal status: INITIAL  3.  Patient will demonstrate full pain free cervical ROM for safety with driving.  Baseline: see chart above Goal status: INITIAL  4.  Patient will demonstrate full pain free lumbar ROM  Baseline: see chart above Goal status: INITIAL  PLAN:  PT FREQUENCY: 2x/week  PT DURATION: 12 weeks  PLANNED INTERVENTIONS: Therapeutic exercises, Therapeutic activity, Neuromuscular re-education, Balance training, Gait training, Patient/Family education, Self Care, Joint mobilization, Stair training, Dry Needling, Electrical stimulation, Spinal manipulation, Spinal mobilization, Cryotherapy, Moist heat, Traction, Ultrasound, Ionotophoresis 4mg /ml Dexamethasone, and Manual therapy  PLAN FOR NEXT SESSION: ASSESS GOALS   [name] Freer, PT,SPTA 01/17/2023, 1:09 PM

## 2023-01-16 ENCOUNTER — Ambulatory Visit: Payer: Medicaid Other | Admitting: Family Medicine

## 2023-01-16 ENCOUNTER — Telehealth: Payer: Self-pay

## 2023-01-16 ENCOUNTER — Ambulatory Visit (INDEPENDENT_AMBULATORY_CARE_PROVIDER_SITE_OTHER): Payer: Medicaid Other

## 2023-01-16 VITALS — BP 142/98 | HR 81 | Ht 72.0 in | Wt 220.0 lb

## 2023-01-16 DIAGNOSIS — M5442 Lumbago with sciatica, left side: Secondary | ICD-10-CM | POA: Diagnosis not present

## 2023-01-16 DIAGNOSIS — G8929 Other chronic pain: Secondary | ICD-10-CM | POA: Diagnosis not present

## 2023-01-16 DIAGNOSIS — M5412 Radiculopathy, cervical region: Secondary | ICD-10-CM | POA: Diagnosis not present

## 2023-01-16 DIAGNOSIS — M542 Cervicalgia: Secondary | ICD-10-CM

## 2023-01-16 DIAGNOSIS — M545 Low back pain, unspecified: Secondary | ICD-10-CM | POA: Diagnosis not present

## 2023-01-16 MED ORDER — AMBULATORY NON FORMULARY MEDICATION: 0 refills | Status: DC

## 2023-01-16 NOTE — Telephone Encounter (Signed)
Patient LVM on sleep lab phone that he needs to make an appointment. Upon reviewing he is needing to schedule an office visit with Dr. Frances Furbish to be able to discuss having a sleep study and restart the sleep eval process after not completing the CPAP Titration study at the beginning of this year and not having a recent office visit with our office. Patient requested a call back at [phone] to schedule.

## 2023-01-16 NOTE — Progress Notes (Signed)
Rubin Payor, PhD, LAT, ATC acting as a scribe for Clementeen Graham, MD.  Phillip Gill is a 47 y.o. male who presents to [facility] at [facility] today for 27-month f/u L foot and neck pain. Hx of plantar fibromatosis excision. Pt was last seen by Dr. Denyse Amass on 12/19/22 and was advised to modify footwear and use Voltaren gel. He was referred to PT for his neck, completing 4 visits.  Today, pt reports neck pain continues, not much improvement. Pain varies day-to-day. He c/o stiffness. Radiating pain and tingling into bilat arms, R>L.  He notes back pain as well.  He has some pain radiating down the left leg.  However dominant issue is back pain.  He has been attending physical therapy for his back pain as well.  PT started for both issues on January 1.  Has been doing home exercises as taught in clinic with me since June 19.  Dx imaging: 12/19/22 C-spine XR 01/08/20 L foot XR   Pertinent review of systems: No fevers or chills  Relevant historical information: Hypertension.  Hepatitis B.  Fibromyalgia.  HIV controlled.  Depression.   Exam:  BP (!) 142/98   Pulse 81   Ht 6' (1.829 m)   Wt 220 lb (99.8 kg)   SpO2 98%   BMI 29.84 kg/m  General: Well Developed, well nourished, and in no acute distress.   MSK: C-spine: Normal appearing. Nontender palpation spinal midline.  Normal cervical motion. Upper extremity strength is intact. Reflexes are intact.  Lumbar spine: Normal appearing. Nontender palpation midline. Normal lumbar motion. Lower extremity strength is intact.   Lab and Radiology Results  X-ray images lumbar spine obtained today personally and independently interpreted EGD mild L5-S1 with some facet DJD at the same level. Await formal radiology review  EXAM: CERVICAL SPINE - 3 VIEW   COMPARISON:  None Available.   FINDINGS: No fracture, dislocation or subluxation. No spondylolisthesis. No osteolytic or osteoblastic changes. Prevertebral and  cervical cranial soft tissues are unremarkable.   Calcification of the anterior annuli identified at C2-3, C4-5 and C5-6 consistent with early degenerative changes.   IMPRESSION: Early/mild degenerative changes. No acute osseous abnormalities.     Electronically Signed   By: Layla Maw M.D.   On: 12/23/2022 22:49   I, Clementeen Graham, personally (independently) visualized and performed the interpretation of the images attached in this note. '  Assessment and Plan: 47 y.o. male with chronic neck pain with paresthesias both upper extremities.  This is concerning for cervical radiculopathy likely at the C7 dermatome.  He has done home exercise program for a month and physical therapy for about 3 weeks.  He is a good candidate for continued PT.  However if not improved would recommend MRI at the 6-week mark.  He will send me a message following his last visit with PT on August 1.  If not better at that point would proceed to MRI.  Low back pain: Again chronic issue.  He does have some pain radiating down the left leg which would be consistent with lumbar radiculopathy likely L4.  Again continue PT.  Again he will send me a message on or after August 1 and at that time if not better would consider MRI lumbar spine as well.  Prescription written for TENS unit.  He is currently not working because of the combination of his other medical conditions and his homeless or almost homeless at times.  He stays with his brother  most of the time.  He does not have any money.  Hopefully his insurance should be able to pay for a TENS unit which could be helpful.  PDMP not reviewed this encounter. Orders Placed This Encounter  Procedures   DG Lumbar Spine 2-3 Views    Standing Status:   Future    Number of Occurrences:   1    Standing Expiration Date:   01/16/2024    Order Specific Question:   Reason for Exam (SYMPTOM  OR DIAGNOSIS REQUIRED)    Answer:   eval low back pain    Order Specific Question:    Preferred imaging location?    Answer:   [facility]   Meds ordered this encounter  Medications   AMBULATORY NON FORMULARY MEDICATION    Sig: TENS unit. Back pain Disp TENS unit and accessories.    Dispense:  1 each    Refill:  0     Discussed warning signs or symptoms. Please see discharge instructions. Patient expresses understanding.   The above documentation has been reviewed and is accurate and complete Clementeen Graham, M.D.

## 2023-01-16 NOTE — Telephone Encounter (Signed)
Spoke to patient Pt wants to return CPAP machine and start over Pt states he has different insurance now and wants to start over. Gave pt [facility] # to call and discuss returning CPAP machine Pt states when he returns the pap machine he will call back and make a OV with Dr Frances Furbish Pt thanked me for calling

## 2023-01-16 NOTE — Patient Instructions (Addendum)
Thank you for coming in today.   Please get an Xray today before you leave   Call my office or send me a mychart message after your visit with PT on Aug 1.   I will order MRI Cspine and Lspine if not better.

## 2023-01-17 ENCOUNTER — Other Ambulatory Visit: Payer: Self-pay

## 2023-01-17 ENCOUNTER — Other Ambulatory Visit: Payer: Self-pay | Admitting: Family

## 2023-01-17 ENCOUNTER — Ambulatory Visit: Payer: Medicaid Other

## 2023-01-17 ENCOUNTER — Encounter: Payer: Self-pay | Admitting: Family

## 2023-01-17 ENCOUNTER — Ambulatory Visit (INDEPENDENT_AMBULATORY_CARE_PROVIDER_SITE_OTHER): Payer: Medicaid Other | Admitting: Family

## 2023-01-17 VITALS — BP 114/88 | HR 85 | Temp 98.4°F | Ht 72.0 in | Wt 219.0 lb

## 2023-01-17 DIAGNOSIS — R350 Frequency of micturition: Secondary | ICD-10-CM

## 2023-01-17 DIAGNOSIS — G8929 Other chronic pain: Secondary | ICD-10-CM

## 2023-01-17 DIAGNOSIS — F330 Major depressive disorder, recurrent, mild: Secondary | ICD-10-CM | POA: Diagnosis not present

## 2023-01-17 DIAGNOSIS — B20 Human immunodeficiency virus [HIV] disease: Secondary | ICD-10-CM

## 2023-01-17 DIAGNOSIS — B181 Chronic viral hepatitis B without delta-agent: Secondary | ICD-10-CM | POA: Diagnosis not present

## 2023-01-17 DIAGNOSIS — M5413 Radiculopathy, cervicothoracic region: Secondary | ICD-10-CM | POA: Diagnosis not present

## 2023-01-17 DIAGNOSIS — M542 Cervicalgia: Secondary | ICD-10-CM

## 2023-01-17 DIAGNOSIS — I10 Essential (primary) hypertension: Secondary | ICD-10-CM | POA: Diagnosis not present

## 2023-01-17 DIAGNOSIS — M546 Pain in thoracic spine: Secondary | ICD-10-CM

## 2023-01-17 MED ORDER — ESCITALOPRAM OXALATE 20 MG PO TABS: 20.0000 mg | ORAL_TABLET | Freq: Every day | ORAL | 5 refills | Status: DC

## 2023-01-17 MED ORDER — FINASTERIDE 5 MG PO TABS
5.0000 mg | ORAL_TABLET | Freq: Every day | ORAL | 1 refills | Status: DC
Start: 2023-01-17 — End: 2023-04-30

## 2023-01-17 MED ORDER — BIKTARVY 50-200-25 MG PO TABS
ORAL_TABLET | ORAL | 5 refills | Status: DC
Start: 2023-01-17 — End: 2023-01-22

## 2023-01-17 MED ORDER — AMLODIPINE BESYLATE 10 MG PO TABS
10.0000 mg | ORAL_TABLET | Freq: Every day | ORAL | 1 refills | Status: DC
  Filled 2023-04-13: qty 90, 90d supply, fill #0

## 2023-01-17 NOTE — Assessment & Plan Note (Signed)
Blood pressure well-controlled with current dose of amlodipine.  No adverse side effects or signs/symptoms of neurological or ophthalmologic conditions..  Continue current dose of amlodipine.

## 2023-01-17 NOTE — Assessment & Plan Note (Addendum)
Hepatitis B remains well-controlled with current dose of Biktarvy containing tenofovir.  Hepatitis B DNA level undetectable with normal liver function testing.  Hepatitis B E antigen positive.  Fibrosis score F2 0 to F1.  Continue current dose of Biktarvy.

## 2023-01-17 NOTE — Patient Instructions (Addendum)
Nice to see you.  Continue to take your medication daily as prescribed.  Refills have been sent to the pharmacy.  Follow up with Internal Medicine, Sports Medicine, Psychiatry  Plan for follow up in 2 months or sooner if needed with lab work on the same day.  Have a great day and stay safe!

## 2023-01-17 NOTE — Assessment & Plan Note (Signed)
Phillip Gill now has well controlled virus with good adherence and tolerance to [employer]. Reviewed lab work and discussed plan of care. Continue current dose of Biktary. Social situation remains a challenge for taking HIV medications and appears to be making positive strides. Covered by Medicaid and working with [employer]. Plan for follow up in 2 months or sooner if needed with lab work on the same day.

## 2023-01-17 NOTE — Telephone Encounter (Signed)
Please advise 

## 2023-01-17 NOTE — Progress Notes (Signed)
Brief Narrative   Patient ID: Phillip Gill, male    DOB: March 03, 1976, 47 y.o.   MRN: [id number]  Mr. Hemmer is a 47 y/o AA male diagnosed with HIV disease in July 2012 with risk factor of MSM. Initial viral load of 99,000 and CD4 of 510 entering care at Stage 1. [id number] negative. Initial genotype with no significant resistance. Previous ART experience with Prezista, Truvada, and Biktarvy.    Subjective:    Chief Complaint  Patient presents with   Follow-up    HPI:  Phillip Gill is a 47 y.o. male with HIV disease and Hepaittis B last seen on 12/17/22 with well controlled virus and good adherence and tolerance to Biktarvy. Viral load was undetectable and CD4 count 1185. Hepaittis B DNA undetectable and fibrosis score of F0-F1. Started on escitalopram for depression. Has been seen by Sports Medicine in the interim. Here today for follow up.  [name] has been doing okay since last office visit with some improvements in his orthopedic conditions and mood has been okay with no significant changes with escitalopram. Denies suicidal ideations. Continues to take Biktarvy as prescribed with no adverse side effects or problems obtaining medication. He does remain unemployed and finances are very limited. Working with [employer] as well. Condoms and STD testing offered.   Denies fevers, chills, night sweats, headaches, changes in vision, neck pain/stiffness, nausea, diarrhea, vomiting, lesions or rashes.    Allergies  Allergen Reactions   Bactrim [Sulfamethoxazole W/Trimethoprim (Co-Trimoxazole)] Hives and Shortness Of Breath   Sulfa Antibiotics Hives and Shortness Of Breath   Cymbalta [Duloxetine Hcl] Diarrhea   Duloxetine Diarrhea   Other Hives and Swelling    [employer] toothpaste    Zoloft [Sertraline Hcl] Diarrhea   Clindamycin/Lincomycin Rash   Neurontin [Gabapentin] Rash      Outpatient Medications Prior to Visit  Medication Sig Dispense Refill   acetaminophen  (TYLENOL) 500 MG tablet Take 1 tablet (500 mg total) by mouth every 6 (six) hours as needed. 30 tablet 0   AMBULATORY NON FORMULARY MEDICATION TENS unit. Back pain Disp TENS unit and accessories. 1 each 0   diclofenac Sodium (VOLTAREN) 1 % GEL Apply 4 g topically 4 (four) times daily. To affected joint. 100 g 11   sildenafil (VIAGRA) 100 MG tablet Take 0.5-1 tablets (50-100 mg total) by mouth daily as needed for erectile dysfunction. Do not exceed 1 tablet per day. 30 tablet 1   amLODipine (NORVASC) 10 MG tablet TAKE 1 TABLET(10 MG) BY MOUTH DAILY 30 tablet 5   bictegravir-emtricitabine-tenofovir AF (BIKTARVY) 50-200-25 MG TABS tablet TAKE 1 TABLET BY MOUTH 1 TIME A DAY. 30 tablet 5   escitalopram (LEXAPRO) 10 MG tablet Take 1 tablet (10 mg total) by mouth daily. 30 tablet 2   finasteride (PROSCAR) 5 MG tablet Take 1 tablet (5 mg total) by mouth daily. 30 tablet 1   ondansetron (ZOFRAN) 4 MG tablet Take 1 tablet (4 mg total) by mouth every 6 (six) hours. (Patient not taking: Reported on 01/17/2023) 12 tablet 0   No facility-administered medications prior to visit.     Past Medical History:  Diagnosis Date   Anxiety    Arthritis    "neck" (02/16/2015)   Chronic hepatitis B (HCC)    SECONDARY TO HIV   Chronic lower back pain    Depression    Fibromyalgia    Genital warts    HIV disease (HCC) 02/28/2015   HIV infection (HCC)  followed by Dr. Luciana Axe- sees him every 4 months   Hypertension    IBS (irritable bowel syndrome)    Migraine    "none in years" (02/16/2015   Overweight 07/20/2015   Renal insufficiency 12/29/2013     Past Surgical History:  Procedure Laterality Date   CO2 LASER APPLICATION N/A 02/11/2013   Procedure: CO2 LASER APPLICATION;  Surgeon: Romie Levee, MD;  Location: [facility];  Service: General;  Laterality: N/A;   FOOT SURGERY     HIGH RESOLUTION ANOSCOPY N/A 02/11/2013   Procedure: HIGH RESOLUTION ANOSCOPY WITH BIOPSY, LASER ABLATION;   Surgeon: Romie Levee, MD;  Location:  [facility];  Service: General;  Laterality: N/A;   WISDOM TOOTH EXTRACTION        Review of Systems  Constitutional:  Negative for appetite change, chills, fatigue, fever and unexpected weight change.  Eyes:  Negative for visual disturbance.  Respiratory:  Negative for cough, chest tightness, shortness of breath and wheezing.   Cardiovascular:  Negative for chest pain and leg swelling.  Gastrointestinal:  Negative for abdominal pain, constipation, diarrhea, nausea and vomiting.  Genitourinary:  Negative for dysuria, flank pain, frequency, genital sores, hematuria and urgency.  Skin:  Negative for rash.  Allergic/Immunologic: Negative for immunocompromised state.  Neurological:  Negative for dizziness and headaches.  Psychiatric/Behavioral:  Positive for dysphoric mood. Negative for self-injury, sleep disturbance and suicidal ideas.       Objective:    BP 114/88   Pulse 85   Temp 98.4 F (36.9 C) (Temporal)   Ht 6' (1.829 m)   Wt 219 lb (99.3 kg)   SpO2 96%   BMI 29.70 kg/m  Nursing note and vital signs reviewed.  Physical Exam Constitutional:      General: He is not in acute distress.    Appearance: He is well-developed.  Eyes:     Conjunctiva/sclera: Conjunctivae normal.  Cardiovascular:     Rate and Rhythm: Normal rate and regular rhythm.     Heart sounds: Normal heart sounds. No murmur heard.    No friction rub. No gallop.  Pulmonary:     Effort: Pulmonary effort is normal. No respiratory distress.     Breath sounds: Normal breath sounds. No wheezing or rales.  Chest:     Chest wall: No tenderness.  Abdominal:     General: Bowel sounds are normal.     Palpations: Abdomen is soft.     Tenderness: There is no abdominal tenderness.  Musculoskeletal:     Cervical back: Neck supple.  Lymphadenopathy:     Cervical: No cervical adenopathy.  Skin:    General: Skin is warm and dry.     Findings: No rash.   Neurological:     Mental Status: He is alert and oriented to person, place, and time.  Psychiatric:        Behavior: Behavior normal.        Thought Content: Thought content normal.        Judgment: Judgment normal.         01/17/2023   10:02 AM 12/17/2022    9:48 AM 10/23/2022   11:14 AM 05/28/2022   10:25 AM 01/22/2022   11:16 AM  Depression screen PHQ 2/9  Decreased Interest 0 0 0 0 0  Down, Depressed, Hopeless 1 0 1 0 0  PHQ - 2 Score 1 0 1 0 0       Assessment & Plan:    Patient Active Problem List  Diagnosis Date Noted   MDD (major depressive disorder), recurrent episode, mild (HCC) 12/19/2022   Urinary frequency 12/18/2022   Left foot pain 12/18/2022   Poor social situation 12/18/2022   Snoring 01/22/2022   Encounter for physical examination related to employment 09/26/2021   Human monkeypox 04/06/2021   Cervical pain 02/07/2021   Erectile dysfunction 08/29/2020   Healthcare maintenance 04/05/2020   Foot lesion 12/24/2019   Human immunodeficiency virus (HIV) disease (HCC) 08/25/2019   Gonorrhea 02/11/2019   Hepatitis B surface antigen positive 09/05/2018   Overweight 07/20/2015   Colitis 03/08/2015   HIV (human immunodeficiency virus infection) (HCC)    Tobacco abuse    LGV (lymphogranuloma venereum)    Primary syphilis    Chronic hepatitis B (HCC) 10/03/2014   HTN (hypertension)    Gynecomastia 04/30/2014   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  No adverse side effects or signs/symptoms of neurological or ophthalmologic conditions..  Continue current dose of amlodipine.      Relevant Medications   amLODipine (NORVASC)  10 MG tablet     Digestive   Chronic hepatitis B (HCC) (Chronic)    Hepatitis B remains well-controlled with current dose of Biktarvy containing tenofovir.  Hepatitis B DNA level undetectable with normal liver function testing.  Hepatitis B E antigen positive.  Fibrosis score F2 0 to F1.  Continue current dose of Biktarvy.      Relevant Medications   bictegravir-emtricitabine-tenofovir AF (BIKTARVY) 50-200-25 MG TABS tablet     Other   Human immunodeficiency virus (HIV) disease (HCC) (Chronic)    Shan now has well controlled virus with good adherence and tolerance to [employer]. Reviewed lab work and discussed plan of care. Continue current dose of Biktary. Social situation remains a challenge for taking HIV medications and appears to be making positive strides. Covered by Medicaid and working with [employer]. Plan for follow up in 2 months or sooner if needed with lab work on the same day.       Relevant Medications   bictegravir-emtricitabine-tenofovir AF (BIKTARVY) 50-200-25 MG TABS tablet   Urinary frequency    Urinary frequency improved with addition of finasteride given no evidence of current infection.  Continue current dose of finasteride.      Relevant Medications   finasteride (PROSCAR) 5 MG tablet   MDD (major depressive disorder), recurrent episode, mild (HCC) - Primary    Depression remains labile with good adherence and tolerance to escitalopram.  Increase medication to 20 mg daily.  Has appointment with counseling coming up.  Advised to seek further care if thoughts of suicide or develops a plan occur.      Relevant Medications   escitalopram (LEXAPRO) 20 MG tablet     I have changed Walter M. Tal "Shan"'s escitalopram. I am also having him maintain his ondansetron, acetaminophen, sildenafil, diclofenac Sodium, AMBULATORY NON FORMULARY MEDICATION, Biktarvy, amLODipine, and finasteride.   Meds ordered this encounter  Medications   escitalopram (LEXAPRO)  20 MG tablet    Sig: Take 1 tablet (20 mg total) by mouth daily.    Dispense:  30 tablet    Refill:  5    Order Specific Question:  Supervising Provider    Answer:   Judyann Munson [[id number]]   bictegravir-emtricitabine-tenofovir AF (BIKTARVY) 50-200-25 MG TABS tablet    Sig: TAKE 1 TABLET BY MOUTH 1 TIME A DAY.    Dispense:  30 tablet    Refill:  5    Order Specific Question:   Supervising Provider    Answer:   Drue Second, CYNTHIA [[id number]]   amLODipine (NORVASC) 10 MG tablet    Sig: TAKE 1 TABLET(10 MG) BY MOUTH DAILY    Dispense:  90 tablet    Refill:  1    Order Specific Question:   Supervising Provider    Answer:   Drue Second, CYNTHIA [[id number]]   finasteride (PROSCAR) 5 MG tablet    Sig: Take 1 tablet (5 mg total) by mouth daily.    Dispense:  30 tablet    Refill:  1    Order Specific Question:   Supervising Provider    Answer:   Judyann Munson [[id number]]     Follow-up: Return in about 2 months (around 03/20/2023).   Marcos Eke, MSN, FNP-C Nurse Practitioner [facility] for Infectious Disease [facility] Main number: [phone]

## 2023-01-17 NOTE — Assessment & Plan Note (Signed)
Urinary frequency improved with addition of finasteride given no evidence of current infection.  Continue current dose of finasteride.

## 2023-01-17 NOTE — Assessment & Plan Note (Signed)
Depression remains labile with good adherence and tolerance to escitalopram.  Increase medication to 20 mg daily.  Has appointment with counseling coming up.  Advised to seek further care if thoughts of suicide or develops a plan occur.

## 2023-01-22 ENCOUNTER — Ambulatory Visit: Payer: Medicaid Other | Admitting: Physical Therapy

## 2023-01-22 ENCOUNTER — Other Ambulatory Visit: Payer: Self-pay | Admitting: Family

## 2023-01-22 DIAGNOSIS — M5413 Radiculopathy, cervicothoracic region: Secondary | ICD-10-CM | POA: Diagnosis not present

## 2023-01-22 DIAGNOSIS — M542 Cervicalgia: Secondary | ICD-10-CM

## 2023-01-22 DIAGNOSIS — M546 Pain in thoracic spine: Secondary | ICD-10-CM

## 2023-01-22 DIAGNOSIS — R350 Frequency of micturition: Secondary | ICD-10-CM

## 2023-01-22 DIAGNOSIS — G8929 Other chronic pain: Secondary | ICD-10-CM

## 2023-01-22 DIAGNOSIS — B20 Human immunodeficiency virus [HIV] disease: Secondary | ICD-10-CM

## 2023-01-22 NOTE — Therapy (Signed)
OUTPATIENT PHYSICAL THERAPY LOWER EXTREMITY TREATMENT   Patient Name: Phillip Gill MRN: [id number] DOB:1976-06-27, 47 y.o., male Today's Date: 01/22/2023  END OF SESSION:  PT End of Session - 01/22/23 1010     Visit Number 6    Date for PT Re-Evaluation 03/25/23    PT Start Time 1015    PT Stop Time 1100    PT Time Calculation (min) 45 min    Activity Tolerance Patient tolerated treatment well    Behavior During Therapy [facility] for tasks assessed/performed              Past Medical History:  Diagnosis Date   Anxiety    Arthritis    "neck" (02/16/2015)   Chronic hepatitis B (HCC)    SECONDARY TO HIV   Chronic lower back pain    Depression    Fibromyalgia    Genital warts    HIV disease (HCC) 02/28/2015   HIV infection (HCC)    followed by Dr. Luciana Axe- sees him every 4 months   Hypertension    IBS (irritable bowel syndrome)    Migraine    "none in years" (02/16/2015   Overweight 07/20/2015   Renal insufficiency 12/29/2013   Past Surgical History:  Procedure Laterality Date   CO2 LASER APPLICATION N/A 02/11/2013   Procedure: CO2 LASER APPLICATION;  Surgeon: Romie Levee, MD;  Location: [facility];  Service: General;  Laterality: N/A;   FOOT SURGERY     HIGH RESOLUTION ANOSCOPY N/A 02/11/2013   Procedure: HIGH RESOLUTION ANOSCOPY WITH BIOPSY, LASER ABLATION;  Surgeon: Romie Levee, MD;  Location: [facility];  Service: General;  Laterality: N/A;   WISDOM TOOTH EXTRACTION     Patient Active Problem List   Diagnosis Date Noted   MDD (major depressive disorder), recurrent episode, mild (HCC) 12/19/2022   Urinary frequency 12/18/2022   Left foot pain 12/18/2022   Poor social situation 12/18/2022   Snoring 01/22/2022   Encounter for physical examination related to employment 09/26/2021   Human monkeypox 04/06/2021   Cervical pain 02/07/2021   Erectile dysfunction 08/29/2020   Healthcare maintenance 04/05/2020   Foot lesion  12/24/2019   Human immunodeficiency virus (HIV) disease (HCC) 08/25/2019   Gonorrhea 02/11/2019   Hepatitis B surface antigen positive 09/05/2018   Overweight 07/20/2015   Colitis 03/08/2015   HIV (human immunodeficiency virus infection) (HCC)    Tobacco abuse    LGV (lymphogranuloma venereum)    Primary syphilis    Chronic hepatitis B (HCC) 10/03/2014   HTN (hypertension)    Gynecomastia 04/30/2014   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   Chronic pain associated with significant psychosocial dysfunction 09/18/2012   Fibromyalgia muscle pain 02/11/2012   Depression 12/07/2011   Anxiety 02/02/2011    PCP: No PCP  REFERRING PROVIDER: Clementeen Graham  REFERRING DIAG:  [phone] (ICD-10-CM) - Left foot pain  M54.2 (ICD-10-CM) - Cervical pain    THERAPY DIAG:  Cervicalgia  Radiculopathy, cervicothoracic region  Pain in thoracic spine  Chronic left-sided low back pain with left-sided sciatica  Rationale for Evaluation and Treatment: Rehabilitation  ONSET DATE: 12/19/22  SUBJECTIVE:   SUBJECTIVE STATEMENT: Hanging in there  PERTINENT HISTORY: HIV, chronic hep B, neck arthritis   PAIN:  Are you having pain? Yes: NPRS scale: 8/10 Pain location: back/neck Pain description: stiff, dull, ache, certain movements can be sharp, sometimes get numbness in my R arm, needles in foot  Aggravating factors: turning my head, holding my head down for a long  period of time Relieving factors: muscle relaxer sometimes   PRECAUTIONS: None  WEIGHT BEARING RESTRICTIONS: No  FALLS:  Has patient fallen in last 6 months? No  LIVING ENVIRONMENT: Lives with: lives with their family Lives in: House/apartment  OCCUPATION: Not currently working  PATIENT GOALS: work on this neck so it won't be stiff anymore  NEXT MD VISIT: 01/16/23  OBJECTIVE:   DIAGNOSTIC FINDINGS:  [facility] Ultrasound of: Left plantar foot. Intact flexor houses longus tendon.  Slight hypoechoic fluid collecting  superficial to the tendon at the area of prior surgery. 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   X-ray images cervical spine obtained today personally and independently interpreted Loss of cervical lordosis indicating spasm.  Mild degenerative changes present.  FINDINGS: No fracture, dislocation or subluxation. No spondylolisthesis. No osteolytic or osteoblastic changes. Prevertebral and cervical cranial soft tissues are unremarkable.   Calcification of the anterior annuli identified at C2-3, C4-5 and C5-6 consistent with early degenerative changes.   IMPRESSION: Early/mild degenerative changes. No acute osseous abnormalities.  COGNITION: Overall cognitive status: Within functional limits for tasks assessed     SENSATION: WFL   POSTURE: rounded shoulders  PALPATION: TTP along cervical spine C1-C7 and thoracic spine T1-T6, he jumps with light palpation and reports pain, tight in upper traps, RUT has trigger points   LOWER EXTREMITY ROM: grossly WFL   LOWER EXTREMITY MMT: grossly WFL   CERVICAL ROM:   Active ROM A/PROM (deg) eval  Flexion Chin to chest but with pain  Extension 25% with pain  Right lateral flexion Very minimal movement with pain  Left lateral flexion Minimal movement with pain  Right rotation 25% with pain  Left rotation 25% with pain   (Blank rows = not tested)   UPPER EXTREMITY ROM: grossly WFL, pain with flexion and abduction   UPPER EXTREMITY MMT: grossly 4+/5 with pain   LUMBAR ROM:  Active ROM A/PROM (deg) eval  Flexion Mid shin with pain  Extension 50% with pain  Right lateral flexion Upper thigh with pain  Left lateral flexion Upper thigh with pain  Right rotation 50% with pain  Left rotation 50% with pain   (Blank rows = not tested)   TODAY'S TREATMENT:                                                                                                                               DATE:   01/22/23 NuStep L1 x Checked goals  Seated trunk flexion and rotation for ROM with blue t-band ball 2x10 Cervical ROM with stretch last rep x10 Standing trunk lateral benders and trunk rotation x5 IFC and MHP to low back x15 mins; MHP to cervical area   01/17/23 NuStep L1 x19mins  UBE L1 x2 mins each way  Calf stretch  30s  Standing shoulder flexion with dowel against wall 2x5 Gentle STM to thoracic and low back  Grade 1-2 PA mobs  MH and IFC x10 mins to LB   01/15/23 PF stretch x2 Black bar PF x3 (pt unable to continue due to pain) NuStep L4 x 3 mins UE and LE; x3 mins LE only Seated tennis ball rolls STM to plantar surface of L foot CP to plantar surface of L foot Standing ext/rows yellow t-band x10; high back pain level Standing bacl flex stretch (min ROM and increased pain) Standing back ext stretch Seated back flex stretch (good ROM and decreased pain) Seated trunk rotation stretch   01/10/23 UBE L1 x63mins each way  Seated STM to neck and passive stretching  Pball rotations and flexion x10 MH and IFC to low back  MH to neck    01/08/23 UBE L1 3 mins each way Trunk ext black t-band 2x10 Trunk flex black t-band x5; dropped t-band due to pain and did x10 ROM Sitting trunk flexion/rotation/lateral flexion stretch x2 ESTIM IFC 10 mins with MHP to low back supine   12/31/22- EVAL    PATIENT EDUCATION:  Education details: POC, HEP Person educated: Patient Education method: Explanation Education comprehension: verbalized understanding  HOME EXERCISE PROGRAM: Access Code: [email or web address] URL: [email or web address] Date: 12/31/2022 Prepared by: [name] Freer  Exercises - Seated Cervical Sidebending Stretch  - 1 x daily - 7 x weekly - 2 sets - 15 hold - Seated Assisted Cervical Rotation with Towel  - 1 x daily -  7 x weekly - 2 sets - 10 reps - Cervical Extension AROM with Strap  - 1 x daily - 7 x weekly - 2 sets - 10 reps - Standing Shoulder Row with Anchored Resistance  - 1 x daily - 7 x weekly - 2 sets - 10 reps - Shoulder extension with resistance - Neutral  - 1 x daily - 7 x weekly - 2 sets - 10 reps - Ulnar Nerve Flossing  - 1 x daily - 7 x weekly - 10 reps - Supine Sciatic Nerve Glide  - 1 x daily - 7 x weekly - 10 reps  ASSESSMENT:  CLINICAL IMPRESSION: Patient entered having a lot of pain in his neck and back. He tolerated the NuStep well once the muscles began to get warmed up. He went on the website recommended by [facility], is waiting for the doctor to confirm and he is waiting to be contacted this week for more information about getting a TENS unit. Pt had tingling going down his L arm which became relieved by shoulder rolls, which started to come on 2-3 weeks ago. Calls sports med doctor back on August 1st to check in and see how he is doing. Pt does not think that PT is working for him since he does not feel any changes in pain or function with exercises, but does feel like e-stim is working for his pain. York Spaniel he wants to continue with his next few visits until he talks to his doctor August 1st. He is very limited in his cervical extension. Pt moves slowly and is stiff due to pain throughout the treatment. We checked goals and he remains similar to when he started. He would benefit from continued PT to decrease his pain and improve his lumbar and cervical ROM.   OBJECTIVE IMPAIRMENTS: Abnormal gait, difficulty walking, decreased ROM, postural dysfunction, and pain.   ACTIVITY LIMITATIONS: bending, squatting, transfers, and locomotion level  PARTICIPATION LIMITATIONS:  driving, shopping, community activity, and yard work  [facility] POTENTIAL: Good  CLINICAL DECISION MAKING: Stable/uncomplicated  EVALUATION COMPLEXITY: Low   GOALS: Goals reviewed with patient? Yes  SHORT TERM GOALS: Target  date: 02/11/23  Patient will be independent with initial HEP.  Goal status: Met 01/22/23  2.  Patient will report centralization of symptoms in his UE and LE   Baseline: N/T in RUE and LLE Goal status: INITIAL; N/T in B UE and LLE 01/22/23   LONG TERM GOALS: Target date: 03/25/23  Patient will be independent with advanced/ongoing HEP to improve outcomes and carryover.  Goal status: INITIAL  2.  Patient will report 75% improvement in neck and back pain to improve QOL.  Baseline: 7/10 Goal status: INITIAL; 7/10 01/22/23  3.  Patient will demonstrate full pain free cervical ROM for safety with driving.  Baseline: see chart above Goal status: INITIAL ; some motion but painful 01/22/23  4.  Patient will demonstrate full pain free lumbar ROM  Baseline: see chart above Goal status: INITIAL; all painful 01/22/23  PLAN:  PT FREQUENCY: 2x/week  PT DURATION: 12 weeks  PLANNED INTERVENTIONS: Therapeutic exercises, Therapeutic activity, Neuromuscular re-education, Balance training, Gait training, Patient/Family education, Self Care, Joint mobilization, Stair training, Dry Needling, Electrical stimulation, Spinal manipulation, Spinal mobilization, Cryotherapy, Moist heat, Traction, Ultrasound, Ionotophoresis 4mg /ml Dexamethasone, and Manual therapy  PLAN FOR NEXT SESSION: continue with lumbar and cervical ROM   Annelisa Ryback,SPTA 01/22/2023, 10:11 AM

## 2023-01-23 NOTE — Therapy (Signed)
OUTPATIENT PHYSICAL THERAPY LOWER EXTREMITY TREATMENT   Patient Name: Phillip Gill MRN: [id number] DOB:[birth year], 47 y.o., male Today's Date: 01/24/2023  END OF SESSION:  PT End of Session - 01/24/23 1012     Visit Number 7    Date for PT Re-Evaluation 03/25/23    PT Start Time 1010    PT Stop Time 1055    PT Time Calculation (min) 45 min    Activity Tolerance Patient tolerated treatment well    Behavior During Therapy [facility] for tasks assessed/performed               Past Medical History:  Diagnosis Date   Anxiety    Arthritis    "neck" (02/16/2015)   Chronic hepatitis B (HCC)    SECONDARY TO HIV   Chronic lower back pain    Depression    Fibromyalgia    Genital warts    HIV disease (HCC) 02/28/2015   HIV infection (HCC)    followed by Dr. Luciana Axe- sees him every 4 months   Hypertension    IBS (irritable bowel syndrome)    Migraine    "none in years" (02/16/2015   Overweight 07/20/2015   Renal insufficiency 12/29/2013   Past Surgical History:  Procedure Laterality Date   CO2 LASER APPLICATION N/A 02/11/2013   Procedure: CO2 LASER APPLICATION;  Surgeon: Romie Levee, MD;  Location: [facility] [place];  Service: General;  Laterality: N/A;   FOOT SURGERY     HIGH RESOLUTION ANOSCOPY N/A 02/11/2013   Procedure: HIGH RESOLUTION ANOSCOPY WITH BIOPSY, LASER ABLATION;  Surgeon: Romie Levee, MD;  Location: [facility] [place];  Service: General;  Laterality: N/A;   WISDOM TOOTH EXTRACTION     Patient Active Problem List   Diagnosis Date Noted   MDD (major depressive disorder), recurrent episode, mild (HCC) 12/19/2022   Urinary frequency 12/18/2022   Left foot pain 12/18/2022   Poor social situation 12/18/2022   Snoring 01/22/2022   Encounter for physical examination related to employment 09/26/2021   Human monkeypox 04/06/2021   Cervical pain 02/07/2021   Erectile dysfunction 08/29/2020   Healthcare maintenance 04/05/2020   Foot lesion  12/24/2019   Human immunodeficiency virus (HIV) disease (HCC) 08/25/2019   Gonorrhea 02/11/2019   Hepatitis B surface antigen positive 09/05/2018   Overweight 07/20/2015   Colitis 03/08/2015   HIV (human immunodeficiency virus infection) (HCC)    Tobacco abuse    LGV (lymphogranuloma venereum)    Primary syphilis    Chronic hepatitis B (HCC) 10/03/2014   HTN (hypertension)    Gynecomastia 04/30/2014   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   Chronic pain associated with significant psychosocial dysfunction 09/18/2012   Fibromyalgia muscle pain 02/11/2012   Depression 12/07/2011   Anxiety 02/02/2011    PCP: No PCP  REFERRING PROVIDER: Clementeen Graham  REFERRING DIAG:  [phone] (ICD-10-CM) - Left foot pain  M54.2 (ICD-10-CM) - Cervical pain    THERAPY DIAG:  Cervicalgia  Radiculopathy, cervicothoracic region  Pain in thoracic spine  Chronic left-sided low back pain with left-sided sciatica  Rationale for Evaluation and Treatment: Rehabilitation  ONSET DATE: 12/19/22  SUBJECTIVE:   SUBJECTIVE STATEMENT: I am struggling, the bus ride over was rough. Right now I feel it down my entire left leg. Neck is still stiff.   PERTINENT HISTORY: HIV, chronic hep B, neck arthritis   PAIN:  Are you having pain? Yes: NPRS scale: 8/10 Pain location: back/neck Pain description: stiff, dull, ache, certain movements can be sharp, sometimes  get numbness in my R arm, needles in foot  Aggravating factors: turning my head, holding my head down for a long period of time Relieving factors: muscle relaxer sometimes   PRECAUTIONS: None  WEIGHT BEARING RESTRICTIONS: No  FALLS:  Has patient fallen in last 6 months? No  LIVING ENVIRONMENT: Lives with: lives with their family Lives in: House/apartment  OCCUPATION: Not currently working  PATIENT GOALS: work on this neck so it won't be stiff anymore  NEXT MD VISIT: 01/16/23  OBJECTIVE:   DIAGNOSTIC FINDINGS:  Diagnostic Limited MSK Ultrasound of:  Left plantar foot. Intact flexor houses longus tendon.  Slight hypoechoic fluid collecting superficial to the tendon at the area of prior surgery. 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   X-ray images cervical spine obtained today personally and independently interpreted Loss of cervical lordosis indicating spasm.  Mild degenerative changes present.  FINDINGS: No fracture, dislocation or subluxation. No spondylolisthesis. No osteolytic or osteoblastic changes. Prevertebral and cervical cranial soft tissues are unremarkable.   Calcification of the anterior annuli identified at C2-3, C4-5 and C5-6 consistent with early degenerative changes.   IMPRESSION: Early/mild degenerative changes. No acute osseous abnormalities.  COGNITION: Overall cognitive status: Within functional limits for tasks assessed     SENSATION: WFL   POSTURE: rounded shoulders  PALPATION: TTP along cervical spine C1-C7 and thoracic spine T1-T6, he jumps with light palpation and reports pain, tight in upper traps, RUT has trigger points   LOWER EXTREMITY ROM: grossly WFL   LOWER EXTREMITY MMT: grossly WFL   CERVICAL ROM:   Active ROM A/PROM (deg) eval  Flexion Chin to chest but with pain  Extension 25% with pain  Right lateral flexion Very minimal movement with pain  Left lateral flexion Minimal movement with pain  Right rotation 25% with pain  Left rotation 25% with pain   (Blank rows = not tested)   UPPER EXTREMITY ROM: grossly WFL, pain with flexion and abduction   UPPER EXTREMITY MMT: grossly 4+/5 with pain   LUMBAR ROM:  Active ROM A/PROM (deg) eval  Flexion Mid shin with pain  Extension 50% with pain  Right lateral flexion Upper thigh with pain  Left lateral flexion Upper thigh with pain  Right rotation 50%  with pain  Left rotation 50% with pain   (Blank rows = not tested)   TODAY'S TREATMENT:                                                                                                                              DATE:  01/24/23 UBE L1 x82mins each way  NuStep L1 x21mins LE only Wall slides x10 Yellow TB rows and ext 2x10 STM to low back with massage gun IFC and MHP to low back x15 mins  01/22/23 NuStep L1 x  Checked goals  Seated trunk flexion and rotation for ROM with blue t-band ball 2x10 Cervical ROM with stretch last rep x10 Standing trunk lateral benders and trunk rotation x5 IFC and MHP to low back x15 mins; MHP to cervical area   01/17/23 NuStep L1 x66mins  UBE L1 x2 mins each way  Calf stretch 30s  Standing shoulder flexion with dowel against wall 2x5 Gentle STM to thoracic and low back  Grade 1-2 PA mobs  MH and IFC x10 mins to LB   01/15/23 PF stretch x2 Black bar PF x3 (pt unable to continue due to pain) NuStep L4 x 3 mins UE and LE; x3 mins LE only Seated tennis ball rolls STM to plantar surface of L foot CP to plantar surface of L foot Standing ext/rows yellow t-band x10; high back pain level Standing bacl flex stretch (min ROM and increased pain) Standing back ext stretch Seated back flex stretch (good ROM and decreased pain) Seated trunk rotation stretch   01/10/23 UBE L1 x5mins each way  Seated STM to neck and passive stretching  Pball rotations and flexion x10 MH and IFC to low back  MH to neck    01/08/23 UBE L1 3 mins each way Trunk ext black t-band 2x10 Trunk flex black t-band x5; dropped t-band due to pain and did x10 ROM Sitting trunk flexion/rotation/lateral flexion stretch x2 ESTIM IFC 10 mins with MHP to low back supine   12/31/22- EVAL    PATIENT EDUCATION:  Education details: POC, HEP Person educated: Patient Education method: Explanation Education comprehension: verbalized understanding  HOME EXERCISE  PROGRAM: Access Code: [email or web address] URL: [email or web address] Date: 12/31/2022 Prepared by: [name] Freer  Exercises - Seated Cervical Sidebending Stretch  - 1 x daily - 7 x weekly - 2 sets - 15 hold - Seated Assisted Cervical Rotation with Towel  - 1 x daily - 7 x weekly - 2 sets - 10 reps - Cervical Extension AROM with Strap  - 1 x daily - 7 x weekly - 2 sets - 10 reps - Standing Shoulder Row with Anchored Resistance  - 1 x daily - 7 x weekly - 2 sets - 10 reps - Shoulder extension with resistance - Neutral  - 1 x daily - 7 x weekly - 2 sets - 10 reps - Ulnar Nerve Flossing  - 1 x daily - 7 x weekly - 10 reps - Supine Sciatic Nerve Glide  - 1 x daily - 7 x weekly - 10 reps  ASSESSMENT:  CLINICAL IMPRESSION: Patient entered having a lot of pain all over. Pt does not think that PT has been beneficial because he does not feel any changes in pain or function with exercises. However, he does feel like e-stim helps some. Pt moves slowly and is stiff due to pain throughout the treatment. He has sharp pains in his L lumbar side today with everything we did. Very tender with light touch and in lots of pain with gentle massage. He would benefit from continued PT to decrease his pain and improve his lumbar and cervical ROM.   OBJECTIVE IMPAIRMENTS: Abnormal gait, difficulty walking, decreased ROM, postural dysfunction, and pain.   ACTIVITY LIMITATIONS: bending, squatting, transfers, and locomotion level  PARTICIPATION LIMITATIONS: driving, shopping, community activity, and yard work  [facility] POTENTIAL: Good  CLINICAL DECISION MAKING: Stable/uncomplicated  EVALUATION COMPLEXITY: Low   GOALS: Goals reviewed with patient? Yes  SHORT TERM GOALS: Target date: 02/11/23  Patient will be independent with initial  HEP.  Goal status: Met 01/22/23  2.  Patient will report centralization of symptoms in his UE and LE   Baseline: N/T in RUE and LLE Goal status: IN PROGRESS; N/T in B UE and  LLE 01/22/23   LONG TERM GOALS: Target date: 03/25/23  Patient will be independent with advanced/ongoing HEP to improve outcomes and carryover.  Goal status: INITIAL  2.  Patient will report 75% improvement in neck and back pain to improve QOL.  Baseline: 7/10 Goal status: IN PROGRESS; 7/10 01/22/23  3.  Patient will demonstrate full pain free cervical ROM for safety with driving.  Baseline: see chart above Goal status: IN PROGRESS ; some motion but painful 01/22/23  4.  Patient will demonstrate full pain free lumbar ROM  Baseline: see chart above Goal status: IN PROGRESS; all painful 01/22/23  PLAN:  PT FREQUENCY: 2x/week  PT DURATION: 12 weeks  PLANNED INTERVENTIONS: Therapeutic exercises, Therapeutic activity, Neuromuscular re-education, Balance training, Gait training, Patient/Family education, Self Care, Joint mobilization, Stair training, Dry Needling, Electrical stimulation, Spinal manipulation, Spinal mobilization, Cryotherapy, Moist heat, Traction, Ultrasound, Ionotophoresis 4mg /ml Dexamethasone, and Manual therapy  PLAN FOR NEXT SESSION: continue with lumbar and cervical ROM   [name] Freer, PT,DPT 01/24/2023, 10:58 AM

## 2023-01-24 ENCOUNTER — Ambulatory Visit: Payer: Medicaid Other

## 2023-01-24 DIAGNOSIS — M542 Cervicalgia: Secondary | ICD-10-CM

## 2023-01-24 DIAGNOSIS — M5413 Radiculopathy, cervicothoracic region: Secondary | ICD-10-CM

## 2023-01-24 DIAGNOSIS — G8929 Other chronic pain: Secondary | ICD-10-CM

## 2023-01-24 DIAGNOSIS — F331 Major depressive disorder, recurrent, moderate: Secondary | ICD-10-CM | POA: Diagnosis not present

## 2023-01-24 DIAGNOSIS — M546 Pain in thoracic spine: Secondary | ICD-10-CM

## 2023-01-24 NOTE — Progress Notes (Signed)
Lumbar spine x-ray looks normal to radiology.  No fractures or severe arthritis.

## 2023-01-29 ENCOUNTER — Ambulatory Visit: Payer: Medicaid Other | Admitting: Physical Therapy

## 2023-01-29 DIAGNOSIS — M542 Cervicalgia: Secondary | ICD-10-CM

## 2023-01-29 DIAGNOSIS — M5413 Radiculopathy, cervicothoracic region: Secondary | ICD-10-CM

## 2023-01-29 DIAGNOSIS — M546 Pain in thoracic spine: Secondary | ICD-10-CM

## 2023-01-29 DIAGNOSIS — G8929 Other chronic pain: Secondary | ICD-10-CM

## 2023-01-29 NOTE — Therapy (Signed)
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 Luciana Axe- sees him every 4 months   Hypertension    IBS (irritable bowel syndrome)    Migraine    "none in years" (02/16/2015   Overweight 07/20/2015   Renal insufficiency 12/29/2013   Past Surgical History:  Procedure Laterality Date   CO2 LASER APPLICATION N/A 02/11/2013   Procedure: CO2 LASER APPLICATION;  Surgeon: Romie Levee, MD;  Location: [facility];  Service: General;  Laterality: N/A;   FOOT SURGERY     HIGH RESOLUTION ANOSCOPY N/A 02/11/2013   Procedure: HIGH RESOLUTION ANOSCOPY WITH BIOPSY, LASER ABLATION;  Surgeon: Romie Levee, MD;  Location: [facility] [place];  Service: General;  Laterality: N/A;   WISDOM TOOTH EXTRACTION     Patient Active Problem List   Diagnosis Date Noted   MDD (major depressive disorder), recurrent episode, mild (HCC) 12/19/2022   Urinary frequency 12/18/2022   Left foot pain 12/18/2022   Poor social situation 12/18/2022   Snoring 01/22/2022   Encounter for physical examination related to employment 09/26/2021   Human monkeypox 04/06/2021   Cervical pain 02/07/2021   Erectile dysfunction 08/29/2020   Healthcare maintenance 04/05/2020   Foot lesion 12/24/2019   Human immunodeficiency virus (HIV) disease (HCC) 08/25/2019   Gonorrhea  02/11/2019   Hepatitis B surface antigen positive 09/05/2018   Overweight 07/20/2015   Colitis 03/08/2015   HIV (human immunodeficiency virus infection) (HCC)    Tobacco abuse    LGV (lymphogranuloma venereum)    Primary syphilis    Chronic hepatitis B (HCC) 10/03/2014   HTN (hypertension)    Gynecomastia 04/30/2014   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   Chronic pain associated with significant psychosocial dysfunction 09/18/2012   Fibromyalgia muscle pain 02/11/2012   Depression 12/07/2011   Anxiety 02/02/2011    PCP: No PCP  REFERRING PROVIDER: Clementeen Graham  REFERRING DIAG:  [phone] (ICD-10-CM) - Left foot pain  M54.2 (ICD-10-CM) - Cervical pain    THERAPY DIAG:  Cervicalgia  Radiculopathy, cervicothoracic region  Pain in thoracic spine  Chronic left-sided low back pain with left-sided sciatica  Rationale for Evaluation and Treatment: Rehabilitation  ONSET DATE: 12/19/22  SUBJECTIVE:   SUBJECTIVE STATEMENT: Still no changes. I am to f/u with MD 8/1. Still awaiting email re: TENS PERTINENT HISTORY: HIV, chronic hep B, neck arthritis   PAIN:  Are you having pain? Yes: NPRS scale: 8/10 Pain location: back/neck Pain description: stiff, dull, ache, certain movements can be sharp, sometimes get numbness in my R arm, needles in foot  Aggravating factors: turning my head, holding my head down for a long  period of time Relieving factors: muscle relaxer sometimes   PRECAUTIONS: None  WEIGHT BEARING RESTRICTIONS: No  FALLS:  Has patient fallen in last 6 months? No  LIVING ENVIRONMENT: Lives with: lives with their family Lives in: House/apartment  OCCUPATION: Not currently working  PATIENT GOALS: work on this neck so it won't be stiff anymore  NEXT MD VISIT: 01/16/23  OBJECTIVE:   DIAGNOSTIC FINDINGS:  Diagnostic Limited MSK Ultrasound of: Left plantar foot. Intact flexor houses longus tendon.  Slight hypoechoic fluid collecting superficial to the tendon at the  area of prior surgery. 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   X-ray images cervical spine obtained today personally and independently interpreted Loss of cervical lordosis indicating spasm.  Mild degenerative changes present.  FINDINGS: No fracture, dislocation or subluxation. No spondylolisthesis. No osteolytic or osteoblastic changes. Prevertebral and cervical cranial soft tissues are unremarkable.   Calcification of the anterior annuli identified at C2-3, C4-5 and C5-6 consistent with early degenerative changes.   IMPRESSION: Early/mild degenerative changes. No acute osseous abnormalities.  COGNITION: Overall cognitive status: Within functional limits for tasks assessed     SENSATION: WFL   POSTURE: rounded shoulders  PALPATION: TTP along cervical spine C1-C7 and thoracic spine T1-T6, he jumps with light palpation and reports pain, tight in upper traps, RUT has trigger points   LOWER EXTREMITY ROM: grossly WFL   LOWER EXTREMITY MMT: grossly WFL   CERVICAL ROM:   Active ROM A/PROM (deg) eval  Flexion Chin to chest but with pain  Extension 25% with pain  Right lateral flexion Very minimal movement with pain  Left lateral flexion Minimal movement with pain  Right rotation 25% with pain  Left rotation 25% with pain   (Blank rows = not tested)   UPPER EXTREMITY ROM: grossly WFL, pain with flexion and abduction   UPPER EXTREMITY MMT: grossly 4+/5 with pain   LUMBAR ROM:  Active ROM A/PROM (deg) eval  Flexion Mid shin with pain  Extension 50% with pain  Right lateral flexion Upper thigh with pain  Left lateral flexion Upper thigh with pain  Right rotation 50% with pain  Left rotation 50% with pain   (Blank rows = not tested)   TODAY'S TREATMENT:                                                                                                                               DATE:  01/29/23 Cerv ROM slow and guarded d/t pain flex decreased 25% all others decreased 50-75% Lumbar ROM slow and guarded d/t pain grossly limited 50% throughout UBE L1 3 min each way Nustep L 2 5 min LE only Yellow tband shld ext and rows 2 sets 10 Black bar toe raise and heel raises 15 x each IFC and MHP to low back  x15 mins  01/24/23 UBE L1 x63mins each way  NuStep L1 x41mins LE only Wall slides x10 Yellow TB rows and ext 2x10 STM to low back with massage gun IFC and MHP to low back x15 mins  01/22/23 NuStep L1 x Checked goals  Seated trunk flexion and rotation for ROM with blue t-band ball 2x10 Cervical ROM with stretch last rep x10 Standing trunk lateral benders and trunk rotation x5 IFC and MHP to low back x15 mins; MHP to cervical area   01/17/23 NuStep L1 x61mins  UBE L1 x2 mins each way  Calf stretch 30s  Standing shoulder flexion with dowel against wall 2x5 Gentle STM to thoracic and low back  Grade 1-2 PA mobs  MH and IFC x10 mins to LB   01/15/23 PF stretch x2 Black bar PF x3 (pt unable to continue due to pain) NuStep L4 x 3 mins UE and LE; x3 mins LE only Seated tennis ball rolls STM to plantar surface of L foot CP to plantar surface of L foot Standing ext/rows yellow t-band x10; high back pain level Standing bacl flex stretch (min ROM and increased pain) Standing back ext stretch Seated back flex stretch (good ROM and decreased pain) Seated trunk rotation stretch   01/10/23 UBE L1 x68mins each way  Seated STM to neck and passive stretching  Pball rotations and flexion x10 MH and IFC to low back  MH to neck    01/08/23 UBE L1 3 mins each way Trunk ext black t-band 2x10 Trunk flex black t-band x5; dropped t-band due to pain and did x10 ROM Sitting trunk flexion/rotation/lateral flexion stretch x2 ESTIM IFC 10 mins with MHP to low back  supine   12/31/22- EVAL    PATIENT EDUCATION:  Education details: POC, HEP Person educated: Patient Education method: Explanation Education comprehension: verbalized understanding  HOME EXERCISE PROGRAM: Access Code: [email or web address] URL: [email or web address] Date: 12/31/2022 Prepared by: [name] Freer  Exercises - Seated Cervical Sidebending Stretch  - 1 x daily - 7 x weekly - 2 sets - 15 hold - Seated Assisted Cervical Rotation with Towel  - 1 x daily - 7 x weekly - 2 sets - 10 reps - Cervical Extension AROM with Strap  - 1 x daily - 7 x weekly - 2 sets - 10 reps - Standing Shoulder Row with Anchored Resistance  - 1 x daily - 7 x weekly - 2 sets - 10 reps - Shoulder extension with resistance - Neutral  - 1 x daily - 7 x weekly - 2 sets - 10 reps - Ulnar Nerve Flossing  - 1 x daily - 7 x weekly - 10 reps - Supine Sciatic Nerve Glide  - 1 x daily - 7 x weekly - 10 reps  ASSESSMENT:  CLINICAL IMPRESSION: Patient entered having a lot of pain all over. Pt does not think that PT has been beneficial because he does not feel any changes in pain or function with exercises. However, he does feel like e-stim helps some. Pt moves slowly and is stiff due to pain throughout the treatment. No goals met. Will place pt on hold after next session and have him f/u with MD OBJECTIVE IMPAIRMENTS: Abnormal gait, difficulty walking, decreased ROM, postural dysfunction, and pain.   ACTIVITY LIMITATIONS: bending, squatting, transfers, and locomotion level  PARTICIPATION LIMITATIONS: driving, shopping, community activity, and yard work  [facility] POTENTIAL: Good  CLINICAL DECISION MAKING: Stable/uncomplicated  EVALUATION COMPLEXITY: Low   GOALS: Goals reviewed with  patient? Yes  SHORT TERM GOALS: Target date: 02/11/23  Patient will be independent with initial HEP.  Goal status: Met 01/22/23  2.  Patient will report centralization of symptoms in his UE and LE   Baseline: N/T in RUE and  LLE Goal status: IN PROGRESS; N/T in B UE and LLE 01/22/23   LONG TERM GOALS: Target date: 03/25/23  Patient will be independent with advanced/ongoing HEP to improve outcomes and carryover.  Goal status: INITIAL  2.  Patient will report 75% improvement in neck and back pain to improve QOL.  Baseline: 7/10 Goal status: IN PROGRESS; 7/10 01/22/23  No changes 01/28/33  3.  Patient will demonstrate full pain free cervical ROM for safety with driving.  Baseline: see chart above Goal status: IN PROGRESS ; some motion but painful 01/22/23 No changes pain and limited 01/29/23  4.  Patient will demonstrate full pain free lumbar ROM  Baseline: see chart above Goal status: IN PROGRESS; all painful 01/22/23 No changes pain and limited 01/29/23  PLAN:  PT FREQUENCY: 2x/week  PT DURATION: 12 weeks  PLANNED INTERVENTIONS: Therapeutic exercises, Therapeutic activity, Neuromuscular re-education, Balance training, Gait training, Patient/Family education, Self Care, Joint mobilization, Stair training, Dry Needling, Electrical stimulation, Spinal manipulation, Spinal mobilization, Cryotherapy, Moist heat, Traction, Ultrasound, Ionotophoresis 4mg /ml Dexamethasone, and Manual therapy  PLAN FOR NEXT SESSION: continue with lumbar and cervical ROM. HOLD after next session   Kaysin Brock,ANGIE, PTA 01/29/2023, 10:13 AM [facility]. [place], [place], [address] Phone: [phone]   Fax:  [phone]  Patient Details  Name: KAY KHAN MRN: [id number] Date of Birth: 11/03/1975 Referring Provider:  Rodolph Bong, MD  Encounter Date: 01/29/2023

## 2023-01-31 ENCOUNTER — Ambulatory Visit: Payer: Medicaid Other | Attending: Family Medicine | Admitting: Physical Therapy

## 2023-01-31 DIAGNOSIS — M5442 Lumbago with sciatica, left side: Secondary | ICD-10-CM | POA: Diagnosis present

## 2023-01-31 DIAGNOSIS — M546 Pain in thoracic spine: Secondary | ICD-10-CM | POA: Insufficient documentation

## 2023-01-31 DIAGNOSIS — M542 Cervicalgia: Secondary | ICD-10-CM | POA: Insufficient documentation

## 2023-01-31 DIAGNOSIS — G8929 Other chronic pain: Secondary | ICD-10-CM | POA: Insufficient documentation

## 2023-01-31 DIAGNOSIS — M5413 Radiculopathy, cervicothoracic region: Secondary | ICD-10-CM | POA: Insufficient documentation

## 2023-01-31 NOTE — Therapy (Signed)
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 Luciana Axe- sees him every 4 months   Hypertension    IBS (irritable bowel syndrome)    Migraine    "none in years" (02/16/2015   Overweight 07/20/2015   Renal insufficiency 12/29/2013   Past Surgical History:  Procedure Laterality Date   CO2 LASER APPLICATION N/A 02/11/2013   Procedure: CO2 LASER APPLICATION;  Surgeon: Romie Levee, MD;  Location: [facility];  Service: General;  Laterality: N/A;   FOOT SURGERY     HIGH RESOLUTION ANOSCOPY N/A 02/11/2013   Procedure: HIGH RESOLUTION ANOSCOPY WITH BIOPSY, LASER ABLATION;  Surgeon: Romie Levee, MD;  Location: [facility] ;  Service: General;  Laterality: N/A;   WISDOM TOOTH EXTRACTION     Patient Active Problem List   Diagnosis Date Noted   MDD (major depressive disorder), recurrent episode, mild (HCC) 12/19/2022   Urinary frequency 12/18/2022   Left foot pain 12/18/2022   Poor social situation 12/18/2022   Snoring 01/22/2022   Encounter for physical examination related to employment 09/26/2021   Human monkeypox 04/06/2021   Cervical pain 02/07/2021   Erectile dysfunction 08/29/2020   Healthcare maintenance 04/05/2020   Foot lesion 12/24/2019   Human immunodeficiency virus (HIV) disease (HCC) 08/25/2019   Gonorrhea  02/11/2019   Hepatitis B surface antigen positive 09/05/2018   Overweight 07/20/2015   Colitis 03/08/2015   HIV (human immunodeficiency virus infection) (HCC)    Tobacco abuse    LGV (lymphogranuloma venereum)    Primary syphilis    Chronic hepatitis B (HCC) 10/03/2014   HTN (hypertension)    Gynecomastia 04/30/2014   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   Chronic pain associated with significant psychosocial dysfunction 09/18/2012   Fibromyalgia muscle pain 02/11/2012   Depression 12/07/2011   Anxiety 02/02/2011    PCP: No PCP  REFERRING PROVIDER: Clementeen Graham  REFERRING DIAG:  [phone] (ICD-10-CM) - Left foot pain  M54.2 (ICD-10-CM) - Cervical pain    THERAPY DIAG:  Cervicalgia  Radiculopathy, cervicothoracic region  Pain in thoracic spine  Chronic left-sided low back pain with left-sided sciatica  Rationale for Evaluation and Treatment: Rehabilitation  ONSET DATE: 12/19/22  SUBJECTIVE:   SUBJECTIVE STATEMENT: Still no changes. I will call MD to schedule. Amb in slow and antalgic PERTINENT HISTORY: HIV, chronic hep B, neck arthritis   PAIN:  Are you having pain? Yes: NPRS scale: 8/10 Pain location: back/neck Pain description: stiff, dull, ache, certain movements can be sharp, sometimes get numbness in my R arm, needles in foot  Aggravating factors: turning my head, holding my head down for a long period  of time Relieving factors: muscle relaxer sometimes   PRECAUTIONS: None  WEIGHT BEARING RESTRICTIONS: No  FALLS:  Has patient fallen in last 6 months? No  LIVING ENVIRONMENT: Lives with: lives with their family Lives in: House/apartment  OCCUPATION: Not currently working  PATIENT GOALS: work on this neck so it won't be stiff anymore  NEXT MD VISIT: 01/16/23  OBJECTIVE:   DIAGNOSTIC FINDINGS:  Diagnostic Limited MSK Ultrasound of: Left plantar foot. Intact flexor houses longus tendon.  Slight hypoechoic fluid collecting superficial to the tendon at the area  of prior surgery. 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   X-ray images cervical spine obtained today personally and independently interpreted Loss of cervical lordosis indicating spasm.  Mild degenerative changes present.  FINDINGS: No fracture, dislocation or subluxation. No spondylolisthesis. No osteolytic or osteoblastic changes. Prevertebral and cervical cranial soft tissues are unremarkable.   Calcification of the anterior annuli identified at C2-3, C4-5 and C5-6 consistent with early degenerative changes.   IMPRESSION: Early/mild degenerative changes. No acute osseous abnormalities.  COGNITION: Overall cognitive status: Within functional limits for tasks assessed     SENSATION: WFL   POSTURE: rounded shoulders  PALPATION: TTP along cervical spine C1-C7 and thoracic spine T1-T6, he jumps with light palpation and reports pain, tight in upper traps, RUT has trigger points   LOWER EXTREMITY ROM: grossly WFL   LOWER EXTREMITY MMT: grossly WFL   CERVICAL ROM:   Active ROM A/PROM (deg) eval  Flexion Chin to chest but with pain  Extension 25% with pain  Right lateral flexion Very minimal movement with pain  Left lateral flexion Minimal movement with pain  Right rotation 25% with pain  Left rotation 25% with pain   (Blank rows = not tested)   UPPER EXTREMITY ROM: grossly WFL, pain with flexion and abduction   UPPER EXTREMITY MMT: grossly 4+/5 with pain   LUMBAR ROM:  Active ROM A/PROM (deg) eval  Flexion Mid shin with pain  Extension 50% with pain  Right lateral flexion Upper thigh with pain  Left lateral flexion Upper thigh with pain  Right rotation 50% with pain  Left rotation 50% with pain   (Blank rows = not tested)   TODAY'S TREATMENT:                                                                                                                               DATE:   01/31/23 UBE 3 min fwd and back Nustep L2 LE only Yellow tband shld ext and row 2 sets 10 Standing hip ext alt 20 then ABD 20 x IFC and MHP to low back x15 mins  01/29/23 Cerv ROM slow and guarded d/t pain flex decreased 25% all others decreased 50-75% Lumbar ROM slow and guarded d/t pain grossly limited 50%  throughout UBE L1 3 min each way Nustep L 2 5 min LE only Yellow tband shld ext and rows 2 sets 10 Black bar toe raise and heel raises 15 x each IFC and MHP to low back x15 mins  01/24/23 UBE L1 x77mins each way  NuStep L1 x68mins LE only Wall slides x10 Yellow TB rows and ext 2x10 STM to low back with massage gun IFC and MHP to low back x15 mins  01/22/23 NuStep L1 x Checked goals  Seated trunk flexion and rotation for ROM with blue t-band ball 2x10 Cervical ROM with stretch last rep x10 Standing trunk lateral benders and trunk rotation x5 IFC and MHP to low back x15 mins; MHP to cervical area   01/17/23 NuStep L1 x96mins  UBE L1 x2 mins each way  Calf stretch 30s  Standing shoulder flexion with dowel against wall 2x5 Gentle STM to thoracic and low back  Grade 1-2 PA mobs  MH and IFC x10 mins to LB   01/15/23 PF stretch x2 Black bar PF x3 (pt unable to continue due to pain) NuStep L4 x 3 mins UE and LE; x3 mins LE only Seated tennis ball rolls STM to plantar surface of L foot CP to plantar surface of L foot Standing ext/rows yellow t-band x10; high back pain level Standing bacl flex stretch (min ROM and increased pain) Standing back ext stretch Seated back flex stretch (good ROM and decreased pain) Seated trunk rotation stretch   01/10/23 UBE L1 x70mins each way  Seated STM to neck and passive stretching  Pball rotations and flexion x10 MH and IFC to low back  MH to neck    01/08/23 UBE L1 3 mins each way Trunk ext black t-band 2x10 Trunk  flex black t-band x5; dropped t-band due to pain and did x10 ROM Sitting trunk flexion/rotation/lateral flexion stretch x2 ESTIM IFC 10 mins with MHP to low back supine   12/31/22- EVAL    PATIENT EDUCATION:  Education details: POC, HEP Person educated: Patient Education method: Explanation Education comprehension: verbalized understanding  HOME EXERCISE PROGRAM: Access Code: [email or web address] URL: [email or web address] Date: 12/31/2022 Prepared by: [name] Freer  Exercises - Seated Cervical Sidebending Stretch  - 1 x daily - 7 x weekly - 2 sets - 15 hold - Seated Assisted Cervical Rotation with Towel  - 1 x daily - 7 x weekly - 2 sets - 10 reps - Cervical Extension AROM with Strap  - 1 x daily - 7 x weekly - 2 sets - 10 reps - Standing Shoulder Row with Anchored Resistance  - 1 x daily - 7 x weekly - 2 sets - 10 reps - Shoulder extension with resistance - Neutral  - 1 x daily - 7 x weekly - 2 sets - 10 reps - Ulnar Nerve Flossing  - 1 x daily - 7 x weekly - 10 reps - Supine Sciatic Nerve Glide  - 1 x daily - 7 x weekly - 10 reps  ASSESSMENT:  CLINICAL IMPRESSION: Patient entered having a lot of pain all over. Pt does not think that PT has been beneficial because he does not feel any changes in pain or function with exercises. However, he does feel like e-stim helps some. Pt moves slowly and is stiff due to pain throughout the treatment. No goals met. Will place pt on hold . OBJECTIVE IMPAIRMENTS: Abnormal gait, difficulty walking, decreased ROM, postural dysfunction, and pain.   ACTIVITY LIMITATIONS: bending, squatting, transfers, and  locomotion level  PARTICIPATION LIMITATIONS: driving, shopping, community activity, and yard work  [facility] POTENTIAL: Good  CLINICAL DECISION MAKING: Stable/uncomplicated  EVALUATION COMPLEXITY: Low   GOALS: Goals reviewed with patient? Yes  SHORT TERM GOALS: Target date: 02/11/23  Patient will be independent with initial HEP.  Goal  status: Met 01/22/23  2.  Patient will report centralization of symptoms in his UE and LE   Baseline: N/T in RUE and LLE Goal status: IN PROGRESS; N/T in B UE and LLE 01/22/23   LONG TERM GOALS: Target date: 03/25/23  Patient will be independent with advanced/ongoing HEP to improve outcomes and carryover.  Goal status: INITIAL  2.  Patient will report 75% improvement in neck and back pain to improve QOL.  Baseline: 7/10 Goal status: IN PROGRESS; 7/10 01/22/23  No changes 01/28/33  3.  Patient will demonstrate full pain free cervical ROM for safety with driving.  Baseline: see chart above Goal status: IN PROGRESS ; some motion but painful 01/22/23 No changes pain and limited 01/29/23  4.  Patient will demonstrate full pain free lumbar ROM  Baseline: see chart above Goal status: IN PROGRESS; all painful 01/22/23 No changes pain and limited 01/29/23  PLAN:  PT FREQUENCY: 2x/week  PT DURATION: 12 weeks  PLANNED INTERVENTIONS: Therapeutic exercises, Therapeutic activity, Neuromuscular re-education, Balance training, Gait training, Patient/Family education, Self Care, Joint mobilization, Stair training, Dry Needling, Electrical stimulation, Spinal manipulation, Spinal mobilization, Cryotherapy, Moist heat, Traction, Ultrasound, Ionotophoresis 4mg /ml Dexamethasone, and Manual therapy  PLAN FOR NEXT SESSION: HOLD. Pt to call and get MD f/u as no effects with PT other than temporary relief with estim Maleik Vanderzee,ANGIE, PTA 01/31/2023, 10:24 AM [facility] [address] [facility]. [place], [place], [address] Phone: [phone]   Fax:  [phone]  Patient Details  Name: Phillip Gill MRN: [id number] Date of Birth: 06/21/1976 Referring Provider:  Rodolph Bong, MD

## 2023-02-05 ENCOUNTER — Telehealth: Payer: Self-pay

## 2023-02-05 ENCOUNTER — Ambulatory Visit (INDEPENDENT_AMBULATORY_CARE_PROVIDER_SITE_OTHER): Payer: Medicaid Other | Admitting: Family Medicine

## 2023-02-05 ENCOUNTER — Other Ambulatory Visit (HOSPITAL_COMMUNITY): Payer: Self-pay

## 2023-02-05 ENCOUNTER — Encounter: Payer: Self-pay | Admitting: Family Medicine

## 2023-02-05 VITALS — BP 132/84 | HR 87 | Temp 97.6°F | Ht 72.0 in | Wt 217.4 lb

## 2023-02-05 DIAGNOSIS — F332 Major depressive disorder, recurrent severe without psychotic features: Secondary | ICD-10-CM

## 2023-02-05 DIAGNOSIS — I10 Essential (primary) hypertension: Secondary | ICD-10-CM | POA: Diagnosis not present

## 2023-02-05 DIAGNOSIS — A630 Anogenital (venereal) warts: Secondary | ICD-10-CM | POA: Diagnosis not present

## 2023-02-05 DIAGNOSIS — G4733 Obstructive sleep apnea (adult) (pediatric): Secondary | ICD-10-CM | POA: Insufficient documentation

## 2023-02-05 DIAGNOSIS — M542 Cervicalgia: Secondary | ICD-10-CM | POA: Diagnosis not present

## 2023-02-05 DIAGNOSIS — Z1211 Encounter for screening for malignant neoplasm of colon: Secondary | ICD-10-CM

## 2023-02-05 DIAGNOSIS — A55 Chlamydial lymphogranuloma (venereum): Secondary | ICD-10-CM

## 2023-02-05 DIAGNOSIS — F330 Major depressive disorder, recurrent, mild: Secondary | ICD-10-CM | POA: Diagnosis not present

## 2023-02-05 DIAGNOSIS — R768 Other specified abnormal immunological findings in serum: Secondary | ICD-10-CM | POA: Diagnosis not present

## 2023-02-05 DIAGNOSIS — Z609 Problem related to social environment, unspecified: Secondary | ICD-10-CM

## 2023-02-05 DIAGNOSIS — B20 Human immunodeficiency virus [HIV] disease: Secondary | ICD-10-CM | POA: Diagnosis not present

## 2023-02-05 DIAGNOSIS — R45851 Suicidal ideations: Secondary | ICD-10-CM | POA: Diagnosis not present

## 2023-02-05 MED ORDER — ARIPIPRAZOLE 2 MG PO TABS
2.0000 mg | ORAL_TABLET | Freq: Every day | ORAL | 0 refills | Status: DC
Start: 2023-02-05 — End: 2023-03-05

## 2023-02-05 NOTE — Patient Instructions (Addendum)
Continue taking escitalopram 20 mg daily; monitor for any side effects and report if they worsen. Start taking Abilify (aripiprazole) 2 mg daily along with escitalopram to help manage depression symptoms. Attend your appointment with the [facility] center next week for counseling services, and we are referring you to psychiatry for further medical management of depression.  We are referring to sleep medicine for sleep apnea.  We referred to our case management team for assistance with housing, financial needs, and Medicaid management for additional support services.  For urgent psychiatric assistance you can go to   [facility] Phone: [phone]  [address] [place], [place] [address]  Hours: Open 24/7, No appointment required.  [facility]  [address] [place], [place] [address] Phone: [phone]  When you're going through tough times, it's easy to feel lonely and overwhelmed. Remember that YOU ARE NOT ALONE and we at [facility] want to support you during this difficult time.  There are many ways to get help and support when you are ready.  You can call the following help lines: - National suicide hotline ((703) 381-9701) - National Hopeline Network (1-800-SUICIDE)   You can schedule an appointment with a team member with your primary care doc or one of our counselors.  We are all here to support you.  A doctor is on call 24/7   There are many treatments that can help people during difficult times, and we can talk with you about medications, counseling, diet, and other options. We want to offer you HOPE that it won't always feel this bad.   If you are seriously thinking about hurting yourself or have a plan, please call 911 or go to any emergency room right away for immediate help.

## 2023-02-05 NOTE — Assessment & Plan Note (Signed)
Diagnosed with sleep apnea, non-compliant with CPAP therapy Plan: Refer to sleep medicine for re-evaluation and CPAP compliance

## 2023-02-05 NOTE — Assessment & Plan Note (Addendum)
W/ passive SI Plan: Continue escitalopram 20 mg daily Add aripiprazole (ABILIFY) 2 mg tablet daily Refer to Psychiatry and Behavioral Health Provide emergency psychiatric resources Refer to the chronic care management team for additional support Schedule follow-up in 4 weeks

## 2023-02-05 NOTE — Telephone Encounter (Signed)
Received fax request from [facility] requesting PA for Aripiprazole 2 mg tablet. Please advise.

## 2023-02-05 NOTE — Assessment & Plan Note (Signed)
Stable.  Continue amlodipine '10mg'$  daily

## 2023-02-05 NOTE — Assessment & Plan Note (Signed)
HIV and Hep B Managed by infectious disease specialists Plan: Maintain current ART regimen: bictegravir-emtricitabine-tenofovir AF daily Continue follow-up with the infectious disease team

## 2023-02-05 NOTE — Assessment & Plan Note (Signed)
Sciatica and cervical pain with minimal relief from PT Plan: Continue current pain management including diclofenac sodium 1% gel Provide TENS unit

## 2023-02-05 NOTE — Telephone Encounter (Signed)
Pharmacy Patient Advocate Encounter   Received notification from Pt Calls Messages that prior authorization for Aripiprazole 2mg  tabs is required/requested.   Insurance verification completed.   The patient is insured through [facility].   Per test claim: PA required; PA submitted to [facility] via CoverMyMeds Key/confirmation #/EOC [id number] Status is pending

## 2023-02-05 NOTE — Progress Notes (Signed)
Assessment/Plan:   Problem List Items Addressed This Visit       Cardiovascular and Mediastinum   HTN (hypertension) (Chronic)    Stable.  Continue amlodipine 10 mg daily.        Respiratory   OSA (obstructive sleep apnea) - Primary    Diagnosed with sleep apnea, non-compliant with CPAP therapy Plan: Refer to sleep medicine for re-evaluation and CPAP compliance       Relevant Orders   Ambulatory referral to Sleep Studies     Digestive   Anal condyloma (Chronic)   Relevant Orders   Ambulatory referral to Gastroenterology     Musculoskeletal and Integument   LGV (lymphogranuloma venereum)     Other   Human immunodeficiency virus (HIV) disease (HCC) (Chronic)    HIV and Hep B Managed by infectious disease specialists Plan: Maintain current ART regimen: bictegravir-emtricitabine-tenofovir AF daily Continue follow-up with the infectious disease team      Hepatitis B surface antigen positive   Cervical pain    Sciatica and cervical pain with minimal relief from PT Plan: Continue current pain management including diclofenac sodium 1% gel Provide TENS unit      Poor social situation   Relevant Orders   AMB Referral to Managed Medicaid Care Management   MDD (major depressive disorder)    W/ passive SI Plan: Continue escitalopram 20 mg daily Add aripiprazole (ABILIFY) 2 mg tablet daily Refer to Psychiatry and Behavioral Health Provide emergency psychiatric resources Refer to the chronic care management team for additional support Schedule follow-up in 4 weeks       Relevant Medications   ARIPiprazole (ABILIFY) 2 MG tablet   Passive suicidal ideations   Other Visit Diagnoses     Screening for colon cancer       Relevant Orders   Ambulatory referral to Gastroenterology       There are no discontinued medications.  Return in about 4 weeks (around 03/05/2023).    Subjective:   Encounter date: 02/05/2023  Phillip Gill is a 47 y.o. male who  has Anxiety; Depression; Fibromyalgia muscle pain; Anal condyloma; Gynecomastia; HTN (hypertension); Chronic hepatitis B (HCC); LGV (lymphogranuloma venereum); Primary syphilis; Colitis; HIV (human immunodeficiency virus infection) (HCC); Tobacco abuse; Overweight; Chronic pain associated with significant psychosocial dysfunction; Gonorrhea; Human immunodeficiency virus (HIV) disease (HCC); Foot lesion; Hepatitis B surface antigen positive; Healthcare maintenance; Erectile dysfunction; Cervical pain; Human monkeypox; Encounter for physical examination related to employment; Snoring; Urinary frequency; Left foot pain; Poor social situation; MDD (major depressive disorder); Passive suicidal ideations; and OSA (obstructive sleep apnea) on their problem list..   He  has a past medical history of Anxiety, Arthritis, Chronic hepatitis B (HCC), Chronic lower back pain, Depression, Fibromyalgia, Genital warts, HIV disease (HCC) (02/28/2015), HIV infection (HCC), Hypertension, IBS (irritable bowel syndrome), Migraine, Overweight (07/20/2015), and Renal insufficiency (12/29/2013)..   Chief Complaint: Establish care and discuss multiple health concerns including depression, anxiety, chronic pain, and high blood pressure. Also, inquire about colonoscopy options.  History of Present Illness:  Mental Health Concerns: Patient reports persistent depression, anxiety, and panic attacks. He has been on escitalopram 20 mg but is experiencing excessive sleepiness, unsure if the symptoms are from the medication or depression itself. Patient has a high PHQ-27 score of 27 and GAD-7 score of 21. His infectious disease specialist has previously managed his mental health with some prescriptions but recommended continuity care with psychiatry.  Chronic Pain: Patient reports chronic pain due to sciatica and neck stiffness. Pain  management including PT only marginally helpful.  Sleep Apnea: He has documented sleep apnea but is currently  not in compliance with sleep apnea therapy due to the need to restart the process with a CPAP device.  Hypertension: Patient has a history of high blood pressure which is being monitored. Last recorded pressure was well-controlled at 132/84.  Hepatitis B and HIV: The patient has a history of Hepatitis B and HIV. Issues surrounding care logistics and struggles with attending appointments due to financial and housing instability.  Review of Systems  Constitutional:  Positive for malaise/fatigue.  Respiratory:  Negative for shortness of breath.   Cardiovascular:  Negative for chest pain.  Musculoskeletal:  Positive for back pain, joint pain and neck pain.  Psychiatric/Behavioral:  Positive for depression and suicidal ideas (passive, but frequent). The patient is nervous/anxious.     Past Surgical History:  Procedure Laterality Date   CO2 LASER APPLICATION N/A 02/11/2013   Procedure: CO2 LASER APPLICATION;  Surgeon: Romie Levee, MD;  Location[facility];  Service: General;  Laterality: N/A;   FOOT SURGERY     HIGH RESOLUTION ANOSCOPY N/A 02/11/2013   Procedure: HIGH RESOLUTION ANOSCOPY WITH BIOPSY, LASER ABLATION;  Surgeon: Romie Levee, MD;  Location: [facility];  Service: General;  Laterality: N/A;   WISDOM TOOTH EXTRACTION      Outpatient Medications Prior to Visit  Medication Sig Dispense Refill   AMBULATORY NON FORMULARY MEDICATION TENS unit. Back pain Disp TENS unit and accessories. 1 each 0   amLODipine (NORVASC) 10 MG tablet TAKE 1 TABLET(10 MG) BY MOUTH DAILY 90 tablet 1   bictegravir-emtricitabine-tenofovir AF (BIKTARVY) 50-200-25 MG TABS tablet TAKE 1 TABLET BY MOUTH EVERY DAY 90 tablet 1   diclofenac Sodium (VOLTAREN) 1 % GEL Apply 4 g topically 4 (four) times daily. To affected joint. 100 g 11   escitalopram (LEXAPRO) 20 MG tablet Take 1 tablet (20 mg total) by mouth daily. 30 tablet 5   finasteride (PROSCAR) 5 MG tablet Take 1 tablet (5 mg  total) by mouth daily. 30 tablet 1   sildenafil (VIAGRA) 100 MG tablet Take 0.5-1 tablets (50-100 mg total) by mouth daily as needed for erectile dysfunction. Do not exceed 1 tablet per day. 30 tablet 1   acetaminophen (TYLENOL) 500 MG tablet Take 1 tablet (500 mg total) by mouth every 6 (six) hours as needed. (Patient not taking: Reported on 02/05/2023) 30 tablet 0   ondansetron (ZOFRAN) 4 MG tablet Take 1 tablet (4 mg total) by mouth every 6 (six) hours. (Patient not taking: Reported on 01/17/2023) 12 tablet 0   No facility-administered medications prior to visit.    Family History  Problem Relation Age of Onset   Hypertension Mother    Diabetes Mother    Stroke Mother        cerbral aneurysm   Hypertension Brother    Sleep apnea Cousin    Mental illness Neg Hx     Social History   Socioeconomic History   Marital status: Single    Spouse name: Not on file   Number of children: Not on file   Years of education: Not on file   Highest education level: Not on file  Occupational History   Not on file  Tobacco Use   Smoking status: Former    Current packs/day: 0.00    Average packs/day: 1 pack/day for 18.0 years (18.0 ttl pk-yrs)    Types: Cigarettes    Start date: 07/03/2003  Quit date: 07/02/2021    Years since quitting: 1.5    Passive exposure: Never   Smokeless tobacco: Never   Tobacco comments:    Pt vapes everyday  Vaping Use   Vaping status: Every Day   Substances: Nicotine  Substance and Sexual Activity   Alcohol use: Yes    Comment: occ   Drug use: No   Sexual activity: Not on file    Comment: accepted condoms  Other Topics Concern   Not on file  Social History Narrative   Grew up in [place], now living in [place],-  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    Finished HS.   Doing online classes with [employer]- studying accounting   Previously 2 years of classes at [employer].    Has Partner- [name]- together for 1 year.    Has 2 girls-  (born in 2000).          Social Determinants of Health   Financial Resource Strain: High Risk (02/05/2023)   Overall Financial Resource Strain (CARDIA)    Difficulty of Paying Living Expenses: Very hard  Food Insecurity: Food Insecurity Present (02/05/2023)   Hunger Vital Sign    Worried About Running Out of Food in the Last Year: Sometimes true    Ran Out of Food in the Last Year: Sometimes true  Transportation Needs: Unmet Transportation Needs (02/05/2023)   PRAPARE - Administrator, Civil Service (Medical): Yes    Lack of Transportation (Non-Medical): Yes  Physical Activity: Sufficiently Active (07/16/2018)   Received from [facility], [facility]   Exercise Vital Sign    Days of Exercise per Week: 4 days    Minutes of Exercise per Session: 150+ min  Stress: Stress Concern Present (07/16/2018)   Received from [facility], [facility]   [employer] of Occupational Health - Occupational Stress Questionnaire    Feeling of Stress : Rather much  Social Connections: Unknown (05/23/2022)   Received from [facility], [facility]   Social Network    Social Network: Not on file  Intimate Partner Violence: Unknown (05/23/2022)   Received from [facility], [facility]   HITS    Physically Hurt: Not on file    Insult or Talk Down To: Not on file    Threaten Physical Harm: Not on file    Scream or Curse: Not on file                                                                                                  Objective:  Physical Exam: BP 132/84 (BP Location: Left Arm, Patient Position: Sitting, Cuff Size: Large)   Pulse 87   Temp 97.6 F (36.4 C) (Temporal)   Ht 6' (1.829 m)   Wt 217 lb 6.4 oz (98.6 kg)   SpO2 96%   BMI 29.48 kg/m     Physical Exam Constitutional:      Appearance: Normal appearance.  HENT:     Head: Normocephalic and atraumatic.     Right Ear: Hearing  normal.      Left Ear: Hearing normal.     Nose: Nose normal.  Eyes:     General: No scleral icterus.       Right eye: No discharge.        Left eye: No discharge.     Extraocular Movements: Extraocular movements intact.  Cardiovascular:     Rate and Rhythm: Normal rate and regular rhythm.     Heart sounds: Normal heart sounds.  Pulmonary:     Effort: Pulmonary effort is normal.     Breath sounds: Normal breath sounds.  Abdominal:     Palpations: Abdomen is soft.     Tenderness: There is no abdominal tenderness.  Skin:    General: Skin is warm.     Findings: No rash.  Neurological:     General: No focal deficit present.     Mental Status: He is alert.     Cranial Nerves: No cranial nerve deficit.  Psychiatric:        Mood and Affect: Mood is depressed. Affect is flat.        Speech: Speech is delayed.        Behavior: Behavior is cooperative.        Thought Content: Thought content includes suicidal ideation. Thought content does not include homicidal ideation. Thought content does not include homicidal or suicidal plan.        Judgment: Judgment is not impulsive or inappropriate.     DG Lumbar Spine 2-3 Views  Result Date: 01/23/2023 CLINICAL DATA:  eval low back pain EXAM: LUMBAR SPINE - 2-3 VIEW COMPARISON:  December 30, 2019. FINDINGS: There are five non-rib bearing lumbar-type vertebral bodies. There is normal alignment. There is no evidence for acute fracture or subluxation. Intervertebral disc spaces are preserved without significant degenerative changes. Visualized abdomen is unremarkable. IMPRESSION: Negative. Electronically Signed   By: Meda Klinefelter M.D.   On: 01/23/2023 19:58   [place] LIMITED JOINT SPACE STRUCTURES LOW LEFT(NO LINKED CHARGES)  Result Date: 12/26/2022 Diagnostic Limited MSK Ultrasound of: Left plantar foot. Intact flexor houses longus tendon.  Slight hypoechoic fluid collecting superficial to the tendon at the area of prior surgery. 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   DG Cervical Spine 2 or 3 views  Result Date: 12/23/2022 CLINICAL DATA:  neck pain EXAM: CERVICAL SPINE - 3 VIEW COMPARISON:  None Available. FINDINGS: No fracture, dislocation or subluxation. No spondylolisthesis. No osteolytic or osteoblastic changes. Prevertebral and cervical cranial soft tissues are unremarkable. Calcification of the anterior annuli identified at C2-3, C4-5 and C5-6 consistent with early degenerative changes. IMPRESSION: Early/mild degenerative changes. No acute osseous abnormalities. Electronically Signed   By: Layla Maw M.D.   On: 12/23/2022 22:49    Recent Results (from the past 2160 hour(s))  COMPLETE METABOLIC PANEL WITH GFR     Status: Abnormal   Collection Time: 12/17/22 10:30 AM  Result Value Ref Range   Glucose, Bld 108 (H) 65 - 99 mg/dL    Comment: .            Fasting reference interval . For someone without known diabetes, a glucose value between 100 and 125 mg/dL is consistent with prediabetes and should be confirmed with a follow-up test. .    BUN 12 7 - 25 mg/dL   Creat 1.61 0.96 -  1.29 mg/dL   eGFR 84 > OR = 60 XB/MWU/1.32G4   BUN/Creatinine Ratio SEE NOTE: 6 - 22 (calc)    Comment:    Not Reported: BUN and Creatinine are within    reference range. .    Sodium 139 135 - 146 mmol/L   Potassium 4.1 3.5 - 5.3 mmol/L   Chloride 105 98 - 110 mmol/L   CO2 29 20 - 32 mmol/L   Calcium 9.9 8.6 - 10.3 mg/dL   Total Protein 8.4 (H) 6.1 - 8.1 g/dL   Albumin 4.5 3.6 - 5.1 g/dL   Globulin 3.9 (H) 1.9 - 3.7 g/dL (calc)   AG Ratio 1.2 1.0 - 2.5 (calc)   Total Bilirubin 0.5 0.2 - 1.2 mg/dL   Alkaline phosphatase (APISO) 80 36 - 130 U/L   AST 27 10 - 40 U/L   ALT 30 9 - 46 U/L  HIV-1 RNA quant-no reflex-bld     Status: Abnormal   Collection Time: 12/17/22 10:30  AM  Result Value Ref Range   HIV 1 RNA Quant 82 (H) Copies/mL   HIV-1 RNA Quant, Log 1.91 (H) Log cps/mL    Comment: . Reference Range:                           Not Detected     copies/mL                           Not Detected Log copies/mL . Marland Kitchen The test was performed using Real-Time Polymerase Chain Reaction. . . Reportable Range: 20 copies/mL to 10,000,000 copies/mL (1.30 Log copies/mL to 7.00 Log copies/mL). .   T-helper cells (CD4) count (not at [facility])     Status: None   Collection Time: 12/17/22 10:30 AM  Result Value Ref Range   CD4 T Helper % 32 30 - 61 %   Absolute CD4 1,185 490 - 1,740 cells/uL   Total lymphocyte count 3,742 850 - 3,900 cells/uL    Comment: The analytical performance characteristics of this assay  have been determined by [employer]. The  modifications have not been cleared or approved by the  FDA. This assay has been validated pursuant to the CLIA  regulations and is used for clinical purposes.   Hepatitis B DNA, ultraquantitative, PCR     Status: Abnormal   Collection Time: 12/17/22 10:30 AM  Result Value Ref Range   Hepatitis B DNA <10 Detected (H) IU/mL    Comment: . HBV DNA was detected below 10 IU/mL. Viral nucleic acid detected below this level cannot be quantified by the assay. .    Hepatitis B virus DNA <1.00 Detected (H) Log IU/mL    Comment: . Reference Range:                          Not Detected       IU/mL                          Not Detected   Log IU/mL . The analytical performance characteristics of this assay have been determined by [employer]. The modifications have not been cleared or approved by the U.S. Food and Drug Administration. This assay has been validated pursuant to the CLIA regulations and is used for clinical purposes. .   Urinalysis, Routine w reflex microscopic  Status: None   Collection Time: 12/17/22 10:37 AM  Result Value Ref Range   Color, Urine YELLOW YELLOW   APPearance CLEAR  CLEAR   Specific Gravity, Urine 1.017 1.001 - 1.035   pH < OR = 5.0 5.0 - 8.0   Glucose, UA NEGATIVE NEGATIVE   Bilirubin Urine NEGATIVE NEGATIVE   Ketones, ur NEGATIVE NEGATIVE   Hgb urine dipstick NEGATIVE NEGATIVE   Protein, ur NEGATIVE NEGATIVE   Nitrite NEGATIVE NEGATIVE   Leukocytes,Ua NEGATIVE NEGATIVE        Garner Nash, MD, MS

## 2023-02-07 ENCOUNTER — Other Ambulatory Visit (HOSPITAL_COMMUNITY): Payer: Self-pay

## 2023-02-07 ENCOUNTER — Telehealth: Payer: Self-pay

## 2023-02-07 NOTE — Telephone Encounter (Signed)
Pharmacy Patient Advocate Encounter  Received notification from [facility] that Prior Authorization for Aripiprazole 2mg  has been APPROVED from 02/05/23 to 02/05/24 . Left message with pharmacy to notify of approval   PA #/Case ID/Reference #:  [id number]

## 2023-02-07 NOTE — Progress Notes (Signed)
   Care Guide Note  02/07/2023 Name: Phillip Gill MRN: [id number] DOB: [birth year]  Referred by: Garnette Gunner, MD Reason for referral : Care Management (Outreach to schedule with Pharm d )   Phillip Gill is a 47 y.o. year old male who is a primary care patient of Garnette Gunner, MD. [name] was referred to the pharmacist for assistance related to HTN, Anxiety, and Depression.    An unsuccessful telephone outreach was attempted today to contact the patient who was referred to the pharmacy team for assistance with medication management. Additional attempts will be made to contact the patient.   Penne Lash, RMA Care Guide [facility]  [place], [place] [address] Direct Dial: [phone] [email or web address]

## 2023-02-07 NOTE — Telephone Encounter (Signed)
Left patient a detailed voice message regarding approval and to return call to office for any concerns.

## 2023-02-12 ENCOUNTER — Other Ambulatory Visit: Payer: Medicaid Other

## 2023-02-12 NOTE — Patient Outreach (Signed)
Medicaid Managed Care Social Work Note  02/12/2023 Name:  Phillip Gill MRN:  [id number] DOB:  April 06, 1976  Phillip Gill is an 47 y.o. year old male who is a primary patient of Garnette Gunner, MD.  The Medicaid Managed Care Coordination team was consulted for assistance with:  Community Resources   Mr. Huibregtse was given information about Medicaid Managed Care Coordination team services today. Phillip Gill Patient agreed to services and verbal consent obtained.  Engaged with patient  for by telephone forinitial visit in response to referral for case management and/or care coordination services.   Assessments/Interventions:  Review of past medical history, allergies, medications, health status, including review of consultants reports, laboratory and other test data, was performed as part of comprehensive evaluation and provision of chronic care management services.  SDOH: (Social Determinant of Health) assessments and interventions performed: SDOH Interventions    Flowsheet Row Office Visit [facility]05/2023 in [facility] [employer]  SDOH Interventions   Food Insecurity Interventions [id number] Referral  Housing Interventions [id number] Referral, AMB Referral  Financial Strain Interventions [id number] Referral, Financial Counselor      BSW completed a telephone outreach with patient. He states he is homeless but goes back and forth between his brother and aunt's home. Patient has applied for disability and is waiting for a decision. He does not have any income  and does receive foodstamps. BSW will provided patient via email [email or web address] community resources Advanced Directives Status:  Not addressed in this encounter.  Care Plan                 Allergies  Allergen Reactions   Bactrim [Sulfamethoxazole W/Trimethoprim (Co-Trimoxazole)] Hives and Shortness Of Breath   Sulfa Antibiotics Hives and Shortness Of Breath   Cymbalta [Duloxetine  Hcl] Diarrhea   Duloxetine Diarrhea   Other Hives and Swelling    [employer]gate toothpaste    Zoloft [Sertraline Hcl] Diarrhea   Clindamycin/Lincomycin Rash   Neurontin [Gabapentin] Rash    Medications Reviewed Today   Medications were not reviewed in this encounter     Patient Active Problem List   Diagnosis Date Noted   Passive suicidal ideations 02/05/2023   OSA (obstructive sleep apnea) 02/05/2023   MDD (major depressive disorder) 12/19/2022   Urinary frequency 12/18/2022   Left foot pain 12/18/2022   Poor social situation 12/18/2022   Snoring 01/22/2022   Encounter for physical examination related to employment 09/26/2021   Human monkeypox 04/06/2021   Cervical pain 02/07/2021   Erectile dysfunction 08/29/2020   Healthcare maintenance 04/05/2020   Foot lesion 12/24/2019   Human immunodeficiency virus (HIV) disease (HCC) 08/25/2019   Gonorrhea 02/11/2019   Hepatitis B surface antigen positive 09/05/2018   Overweight 07/20/2015   Colitis 03/08/2015   HIV (human immunodeficiency virus infection) (HCC)    Tobacco abuse    LGV (lymphogranuloma venereum)    Primary syphilis    Chronic hepatitis B (HCC) 10/03/2014   HTN (hypertension)    Gynecomastia 04/30/2014   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   Chronic pain associated with significant psychosocial dysfunction 09/18/2012   Fibromyalgia muscle pain 02/11/2012   Depression 12/07/2011   Anxiety 02/02/2011    Conditions to be addressed/monitored per PCP order:   community resources  There are no care plans that you recently modified to display for this patient.   Follow up:  Patient agrees to Care Plan and Follow-up.  Plan: The Managed Medicaid care management team will reach out to the patient  again over the next 30 days.  Date/time of next scheduled Social Work care management/care coordination outreach:  03/15/23  Gus Puma, Kenard Gower, [facility]  Managed [facility] Social Worker [phone]

## 2023-02-12 NOTE — Patient Instructions (Signed)
Visit Information  Mr. Butrick was given information about Medicaid Managed Care team care coordination services as a part of their [facility] Community Plan Medicaid benefit. [name] verbally consented to engagement with the [facility] Managed Care team.   If you are experiencing a medical emergency, please call 911 or report to your local emergency department or urgent care.   If you have a non-emergency medical problem during routine business hours, please contact your provider's office and ask to speak with a nurse.   For questions related to your [facility], please call: [phone] or visit the homepage here: [email or web address]  If you would like to schedule transportation through your [facility], please call the following number at least 2 days in advance of your appointment: [phone]   Rides for urgent appointments can also be made after hours by calling Member Services.  Call the Behavioral Health Crisis Line at [phone], at any time, 24 hours a day, 7 days a week. If you are in danger or need immediate medical attention call 911.  If you would like help to quit smoking, call 1-800-QUIT-NOW (415-535-0693) OR Espaol: 1-855-Djelo-Ya (1-324-401-0272) o para ms informacin haga clic aqu or Text READY to 536-644 to register via text  Mr. Saelee - following are the goals we discussed in your visit today:   Goals Addressed   None      Social Worker will follow up in 30 days.   Gus Puma, Kenard Gower, MHA [facility]  Managed Medicaid Social Worker [phone]   Following is a copy of your plan of care:  There are no care plans that you recently modified to display for this patient.

## 2023-02-13 ENCOUNTER — Other Ambulatory Visit: Payer: Medicaid Other | Admitting: Licensed Clinical Social Worker

## 2023-02-13 NOTE — Patient Instructions (Signed)
Visit Information  Phillip Gill was given information about Medicaid Managed Care team care coordination services as a part of their [facility] Plan Medicaid benefit. Phillip Gill verbally consented to engagement with the [facility] Managed Care team.   If you are experiencing a medical emergency, please call 911 or report to your local emergency department or urgent care.   If you have a non-emergency medical problem during routine business hours, please contact your provider's office and ask to speak with a nurse.   For questions related to your [facility], please call: [phone] or visit the homepage here: [email or web address]  If you would like to schedule transportation through your [facility], please call the following number at least 2 days in advance of your appointment: [phone]   Rides for urgent appointments can also be made after hours by calling Member Services.  Call the Behavioral Health Crisis Line at [phone], at any time, 24 hours a day, 7 days a week. If you are in danger or need immediate medical attention call 911.  If you would like help to quit smoking, call 1-800-QUIT-NOW ((440) 297-8490) OR Espaol: 1-855-Djelo-Ya (1-324-401-0272) o para ms informacin haga clic aqu or Text READY to 536-644 to register via text  Following is a copy of your plan of care:  Care Plan : LCSW Plan of Care  Updates made by Gustavus Bryant, LCSW since 02/13/2023 12:00 AM     Problem: Depression Identification (Depression)      Goal: Depressive Symptoms Identified   Note:   Priority: High  Timeframe:  Short-Range Goal Priority:  High Start Date:   02/13/23        Expected End Date:  ongoing                     Follow Up Date--03/01/23 at 3 pm  - keep 90 percent of scheduled appointments -consider counseling or psychiatry -consider bumping up  your self-care  -consider creating a stronger support network   Why is this important?             Combatting depression may take some time.            If you don't feel better right away, don't give up on your treatment plan.    Current barriers:   Chronic Mental Health needs related to depression, stress and anxiety. Patient requires Support, Education, Resources, Referrals, Advocacy, and Care Coordination, in order to meet Unmet Mental Health Needs. (To Find a Visual merchandiser) Patient will implement clinical interventions discussed today to decrease symptoms of depression and increase knowledge and/or ability of: coping skills. Homeless Limited Support Network Mental Health Concerns and Social Isolation Patient lacks knowledge of available community counseling agencies and resources.  Clinical Goal(s): verbalize understanding of plan for management of Anxiety, Depression, and Stress and demonstrate a reduction in symptoms. Patient will connect with a provider for ongoing mental health treatment, increase coping skills, healthy habits, self-management skills, and stress reduction        Patient Goals/Self-Care Activities: Take medications as prescribed   Attend all scheduled provider appointments Call pharmacy for medication refills 3-7 days in advance of running out of medications Perform all self care activities independently  Perform IADL's (shopping, preparing meals, housekeeping, managing finances) independently Call provider office for new concerns or questions Work with the social worker to address care coordination needs and will continue to work with the clinical team to address  health care and disease management related needs call 1-800-273-TALK (toll free, 24 hour hotline) go to [facility] [address] [place] [phone]) call 911 if experiencing a Mental Health or Behavioral Health Crisis  Utilize healthy coping skills  and supportive resources discussed Contact PCP with any questions or concerns Keep 90 percent of counseling appointments Call your insurance provider for more information about your Enhanced Benefits  Check out counseling resources provided  Begin personal counseling with LCSW, to reduce and manage symptoms of Depression and Stress, until well-established with mental health provider Accept all calls from representative with [facility] in an effort to establish ongoing mental health counseling and supportive services. Incorporate into daily practice - relaxation techniques, deep breathing exercises, and mindfulness meditation strategies. Talk about feelings with friends, family members, spiritual advisor, etc. Contact LCSW directly [phone]), if you have questions, need assistance, or if additional social work needs are identified between now and our next scheduled telephone outreach call. Call 988 for mental health hotline/crisis line if needed (24/7 available) Try techniques to reduce symptoms of anxiety/negative thinking (deep breathing, distraction, positive self talk, etc)  - develop a personal safety plan - develop a plan to deal with triggers like holidays, anniversaries - exercise at least 2 to 3 times per week - have a plan for how to handle bad days - journal feelings and what helps to feel better or worse - spend time or talk with others at least 2 to 3 times per week - watch for early signs of feeling worse - begin personal counseling - call and visit an old friend - check out volunteer opportunities - join a support group - laugh; watch a funny movie or comedian - learn and use visualization or guided imagery - perform a random act of kindness - practice relaxation or meditation daily - start or continue a personal journal - practice positive thinking and self-talk -continue with compliance of taking medication  -identify current effective and ineffective coping strategies.   -implement positive self-talk in care to increase self-esteem, confidence and feelings of control.  -consider alternative and complementary therapy approaches such as meditation, mindfulness or yoga.  Call your insurance provider to gain education on benefits if desired Call your primary care doctor if symptoms get worse  -journaling, prayer, worship services, meditation or pastoral counseling.  -increase participation in pleasurable group activities such as hobbies, singing, sports or volunteering).  -consider the use of meditative movement therapy such as tai chi, yoga or qigong.  -start a regular daily exercise program based on tolerance, ability and patient choice to support positive thinking and activity    Follow Up Plan:  The patient has been provided with contact information for the care management team and has been advised to call with any mental health or health related questions or concerns.  The care management team will reach out to the patient again over the next 30 business  days.   If you are experiencing a Mental Health or Behavioral Health Crisis or need someone to talk to, please call the Suicide and Crisis Lifeline: 988    Patient Goals: Initial goal  Dickie La, BSW, MSW, [employer] Managed Medicaid LCSW [facility]  [facility] [email or web address] Phone: [phone]

## 2023-02-13 NOTE — Patient Outreach (Signed)
Medicaid Managed Care Social Work Note  02/13/2023 Name:  Phillip Gill MRN:  [id number] DOB:  12/21/1975  [name] is an 47 y.o. year old male who is a primary patient of Garnette Gunner, MD.  The Medicaid Managed Care Coordination team was consulted for assistance with:  Mental Health Counseling and Resources  Mr. Bingham was given information about Medicaid Managed Care Coordination team services today. [name] Patient agreed to services and verbal consent obtained.  Engaged with patient  for by telephone forinitial visit in response to referral for case management and/or care coordination services.   Assessments/Interventions:  Review of past medical history, allergies, medications, health status, including review of consultants reports, laboratory and other test data, was performed as part of comprehensive evaluation and provision of chronic care management services.  SDOH: (Social Determinant of Health) assessments and interventions performed: SDOH Interventions    Flowsheet Row Patient Outreach Telephone from 02/13/2023 in [place] POPULATION HEALTH DEPARTMENT Office Visit from 02/05/2023 in [facility] [employer]  SDOH Interventions    Food Insecurity Interventions -- [id number] Referral  Housing Interventions -- [id number] Referral, AMB Referral  Depression Interventions/Treatment  Referral to Psychiatry, Counseling --  Financial Strain Interventions -- [id number] Referral, Artist  Stress Interventions Offered [employer], Provide Counseling  [Homeless] --       Advanced Directives Status:  See Care Plan for related entries.  Care Plan                 Allergies  Allergen Reactions   Bactrim [Sulfamethoxazole W/Trimethoprim (Co-Trimoxazole)] Hives and Shortness Of Breath   Sulfa Antibiotics Hives and Shortness Of Breath   Cymbalta [Duloxetine Hcl] Diarrhea   Duloxetine Diarrhea   Other  Hives and Swelling    [employer] toothpaste    Zoloft [Sertraline Hcl] Diarrhea   Clindamycin/Lincomycin Rash   Neurontin [Gabapentin] Rash    Medications Reviewed Today   Medications were not reviewed in this encounter     Patient Active Problem List   Diagnosis Date Noted   Passive suicidal ideations 02/05/2023   OSA (obstructive sleep apnea) 02/05/2023   MDD (major depressive disorder) 12/19/2022   Urinary frequency 12/18/2022   Left foot pain 12/18/2022   Poor social situation 12/18/2022   Snoring 01/22/2022   Encounter for physical examination related to employment 09/26/2021   Human monkeypox 04/06/2021   Cervical pain 02/07/2021   Erectile dysfunction 08/29/2020   Healthcare maintenance 04/05/2020   Foot lesion 12/24/2019   Human immunodeficiency virus (HIV) disease (HCC) 08/25/2019   Gonorrhea 02/11/2019   Hepatitis B surface antigen positive 09/05/2018   Overweight 07/20/2015   Colitis 03/08/2015   HIV (human immunodeficiency virus infection) (HCC)    Tobacco abuse    LGV (lymphogranuloma venereum)    Primary syphilis    Chronic hepatitis B (HCC) 10/03/2014   HTN (hypertension)    Gynecomastia 04/30/2014   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   Chronic pain associated with significant psychosocial dysfunction 09/18/2012   Fibromyalgia muscle pain 02/11/2012   Depression 12/07/2011   Anxiety 02/02/2011    Conditions to be addressed/monitored per PCP order:  Anxiety and Depression  Care Plan : LCSW Plan of Care  Updates made by Gustavus Bryant, LCSW since 02/13/2023 12:00 AM     Problem: Depression Identification (Depression)      Goal: Depressive Symptoms Identified   Note:   Priority: High  Timeframe:  Short-Range Goal Priority:  High Start Date:  02/13/23        Expected End Date:  ongoing                     Follow Up Date--03/01/23 at 3 pm  - keep 90 percent of scheduled appointments -consider counseling or psychiatry -consider bumping up your self-care   -consider creating a stronger support network   Why is this important?             Combatting depression may take some time.            If you don't feel better right away, don't give up on your treatment plan.    Current barriers:   Chronic Mental Health needs related to depression, stress and anxiety. Patient requires Support, Education, Resources, Referrals, Advocacy, and Care Coordination, in order to meet Unmet Mental Health Needs. (To Find a Visual merchandiser) Patient will implement clinical interventions discussed today to decrease symptoms of depression and increase knowledge and/or ability of: coping skills. Homeless Limited Support Network Mental Health Concerns and Social Isolation Patient lacks knowledge of available community counseling agencies and resources.  Clinical Goal(s): verbalize understanding of plan for management of Anxiety, Depression, and Stress and demonstrate a reduction in symptoms. Patient will connect with a provider for ongoing mental health treatment, increase coping skills, healthy habits, self-management skills, and stress reduction        Clinical Interventions:  Assessed patient's previous and current treatment, coping skills, support system and barriers to care. Patient provided hx  Verbalization of feelings encouraged, motivational interviewing employed Emotional support provided, positive coping strategies explored. Establishing healthy boundaries emphasized and healthy self-care education provided Patient was educated on available mental health resources within their area that accept Medicaid and offer counseling and psychiatry. PCP has already made referrals for both services. He started therapy at [facility] the [place] on 02/11/23 and a referral was made to [facility]. Patient and [facility] LCSW contacted [facility] and was informed that he will have to go to [facility] for treatment due to having Medicaid and so his 02/05/23 referral was transferred  there today on 02/13/23.  Patient started taking Abilify today and is already on Lexapro. Patient educated on the difference between therapy and psychiatry per patient request Email sent to patient today with available mental health resources within his area that accept Medicaid. Emotional support provided. CBT intervention implemented regarding "being mentally fit" by combating negative thinking and replacing it with uplifting support, hope and positivity. Patient reports that his brother, cousin and aunt are his main support but that they have their own lives with their own problems and he often times feels like a burden to them.  Patient reports significant worsening anxiety and depression impacting his ability to function appropriately and carry out daily task. Assessed social determinant of health barriers LCSW provided education on relaxation techniques such as meditation, deep breathing, massage, grounding exercises or yoga that can activate the body's relaxation response and ease symptoms of stress and anxiety. LCSW ask that when pt is struggling with difficult emotions and racing thoughts that they start this relaxation response process. LCSW provided extensive education on healthy coping skills for anxiety. SW used active and reflective listening, validated patient's feelings/concerns, and provided emotional support. Patient will work on implementing appropriate self-care habits into their daily routine such as: staying positive, writing a gratitude list, drinking water, staying active around the house, taking their medications as prescribed, combating negative thoughts or emotions and staying  connected with their family and friends. Positive reinforcement provided for this decision to work on this. Motivational Interviewing employed Depression screen reviewed  PHQ2/ PHQ9 completed or reviewed  Mindfulness or Relaxation training provided Active listening / Reflection utilized  Advance Care  and HCPOA education provided Emotional Support Provided Problem Solving /Task Center strategies reviewed Provided psychoeducation for mental health needs  Provided brief CBT  Reviewed mental health medications and discussed importance of compliance:  Quality of sleep assessed & Sleep Hygiene techniques promoted  Participation in counseling encouraged  Verbalization of feelings encouraged  Suicidal Ideation/Homicidal Ideation assessed: Patient denies SI/HI  Review resources, discussed options and provided patient information about  Mental Health Resources Inter-disciplinary care team collaboration (see longitudinal plan of care)  Patient Goals/Self-Care Activities: Take medications as prescribed   Attend all scheduled provider appointments Call pharmacy for medication refills 3-7 days in advance of running out of medications Perform all self care activities independently  Perform IADL's (shopping, preparing meals, housekeeping, managing finances) independently Call provider office for new concerns or questions Work with the social worker to address care coordination needs and will continue to work with the clinical team to address health care and disease management related needs call 1-800-273-TALK (toll free, 24 hour hotline) go to [facility] [address] [place] [phone]) call 911 if experiencing a Mental Health or Behavioral Health Crisis  Utilize healthy coping skills and supportive resources discussed Contact PCP with any questions or concerns Keep 90 percent of counseling appointments Call your insurance provider for more information about your Enhanced Benefits  Check out counseling resources provided  Begin personal counseling with LCSW, to reduce and manage symptoms of Depression and Stress, until well-established with mental health provider Accept all calls from representative with [facility] in an effort to establish ongoing mental  health counseling and supportive services. Incorporate into daily practice - relaxation techniques, deep breathing exercises, and mindfulness meditation strategies. Talk about feelings with friends, family members, spiritual advisor, etc. Contact LCSW directly [phone]), if you have questions, need assistance, or if additional social work needs are identified between now and our next scheduled telephone outreach call. Call 988 for mental health hotline/crisis line if needed (24/7 available) Try techniques to reduce symptoms of anxiety/negative thinking (deep breathing, distraction, positive self talk, etc)  - develop a personal safety plan - develop a plan to deal with triggers like holidays, anniversaries - exercise at least 2 to 3 times per week - have a plan for how to handle bad days - journal feelings and what helps to feel better or worse - spend time or talk with others at least 2 to 3 times per week - watch for early signs of feeling worse - begin personal counseling - call and visit an old friend - check out volunteer opportunities - join a support group - laugh; watch a funny movie or comedian - learn and use visualization or guided imagery - perform a random act of kindness - practice relaxation or meditation daily - start or continue a personal journal - practice positive thinking and self-talk -continue with compliance of taking medication  -identify current effective and ineffective coping strategies.  -implement positive self-talk in care to increase self-esteem, confidence and feelings of control.  -consider alternative and complementary therapy approaches such as meditation, mindfulness or yoga.  Call your insurance provider to gain education on benefits if desired Call your primary care doctor if symptoms get worse  -journaling, prayer, worship services, meditation or  pastoral counseling.  -increase participation in pleasurable group activities such as hobbies,  singing, sports or volunteering).  -consider the use of meditative movement therapy such as tai chi, yoga or qigong.  -start a regular daily exercise program based on tolerance, ability and patient choice to support positive thinking and activity    Follow Up Plan:  The patient has been provided with contact information for the care management team and has been advised to call with any mental health or health related questions or concerns.  The care management team will reach out to the patient again over the next 30 business  days.   If you are experiencing a Mental Health or Behavioral Health Crisis or need someone to talk to, please call the Suicide and Crisis Lifeline: 988    Patient Goals: Initial goal       Follow up:  Patient agrees to Care Plan and Follow-up.  Plan: The Managed Medicaid care management team will reach out to the patient again over the next 39 days.  Dickie La, BSW, MSW, [employer] Managed Medicaid LCSW [facility]  [facility] [email or web address] Phone: [phone]

## 2023-02-14 ENCOUNTER — Encounter (INDEPENDENT_AMBULATORY_CARE_PROVIDER_SITE_OTHER): Payer: Self-pay

## 2023-02-21 NOTE — Progress Notes (Signed)
   Care Guide Note  02/21/2023 Name: [name] MRN: [id number] DOB: [birth year]  Referred by: Garnette Gunner, MD Reason for referral : Care Management (Outreach to schedule with Pharm d )   Phillip Gill is a 47 y.o. year old male who is a primary care patient of Garnette Gunner, MD. [name] was referred to the pharmacist for assistance related to HTN.    Successful contact was made with the patient to discuss pharmacy services including being ready for the pharmacist to call at least 5 minutes before the scheduled appointment time, to have medication bottles and any blood sugar or blood pressure readings ready for review. The patient agreed to meet with the pharmacist via with the pharmacist via telephone visit on (date/time).  03/07/2023  Penne Lash, RMA Care Guide [facility]  [place], [place] [address] Direct Dial: [phone] [email or web address]

## 2023-02-26 ENCOUNTER — Encounter: Payer: Self-pay | Admitting: Internal Medicine

## 2023-03-01 ENCOUNTER — Encounter: Payer: Self-pay | Admitting: Neurology

## 2023-03-01 ENCOUNTER — Other Ambulatory Visit: Payer: Self-pay | Admitting: Oncology

## 2023-03-01 ENCOUNTER — Other Ambulatory Visit: Payer: Medicaid Other | Admitting: Licensed Clinical Social Worker

## 2023-03-01 DIAGNOSIS — F411 Generalized anxiety disorder: Secondary | ICD-10-CM | POA: Diagnosis not present

## 2023-03-01 DIAGNOSIS — Z006 Encounter for examination for normal comparison and control in clinical research program: Secondary | ICD-10-CM

## 2023-03-01 DIAGNOSIS — F419 Anxiety disorder, unspecified: Secondary | ICD-10-CM

## 2023-03-01 DIAGNOSIS — F332 Major depressive disorder, recurrent severe without psychotic features: Secondary | ICD-10-CM

## 2023-03-01 NOTE — Patient Instructions (Signed)
Visit Information  [name] was given information about Medicaid Managed Care team care coordination services as a part of their [facility] Community Plan Medicaid benefit. Phillip Gill verbally consented to engagement with the [facility] Managed Care team.   If you are experiencing a medical emergency, please call 911 or report to your local emergency department or urgent care.   If you have a non-emergency medical problem during routine business hours, please contact your provider's office and ask to speak with a nurse.   For questions related to your [facility], please call: [phone] or visit the homepage here: [email or web address]  If you would like to schedule transportation through your [facility], please call the following number at least 2 days in advance of your appointment: [phone]   Rides for urgent appointments can also be made after hours by calling Member Services.  Call the Behavioral Health Crisis Line at (325)204-4786, at any time, 24 hours a day, 7 days a week. If you are in danger or need immediate medical attention call 911.  If you would like help to quit smoking, call 1-800-QUIT-NOW (409-261-7184) OR Espaol: 1-855-Djelo-Ya (1-324-401-0272) o para ms informacin haga clic aqu or Text READY to 536-644 to register via text   Following is a copy of your plan of care:  Care Plan : LCSW Plan of Care  Updates made by Gustavus Bryant, LCSW since 03/01/2023 12:00 AM     Problem: Depression Identification (Depression)      Goal: Depressive Symptoms Identified   Note:   Priority: High  Timeframe:  Short-Range Goal Priority:  High Start Date:   02/13/23        Expected End Date:  ongoing                     Follow Up Date--04/01/23 at 3 pm  - keep 90 percent of scheduled appointments -consider counseling or psychiatry -consider bumping up  your self-care  -consider creating a stronger support network   Why is this important?             Combatting depression may take some time.            If you don't feel better right away, don't give up on your treatment plan.    Current barriers:   Chronic Mental Health needs related to depression, stress and anxiety. Patient requires Support, Education, Resources, Referrals, Advocacy, and Care Coordination, in order to meet Unmet Mental Health Needs. (To Find a Visual merchandiser) Patient will implement clinical interventions discussed today to decrease symptoms of depression and increase knowledge and/or ability of: coping skills. Homeless Limited Support Network Mental Health Concerns and Social Isolation Patient lacks knowledge of available community counseling agencies and resources.  Clinical Goal(s): verbalize understanding of plan for management of Anxiety, Depression, and Stress and demonstrate a reduction in symptoms. Patient will connect with a provider for ongoing mental health treatment, increase coping skills, healthy habits, self-management skills, and stress reduction        Patient Goals/Self-Care Activities: Take medications as prescribed   Attend all scheduled provider appointments Call pharmacy for medication refills 3-7 days in advance of running out of medications Perform all self care activities independently  Perform IADL's (shopping, preparing meals, housekeeping, managing finances) independently Call provider office for new concerns or questions Work with the social worker to address care coordination needs and will continue to work with the clinical team to  address health care and disease management related needs call 1-800-273-TALK (toll free, 24 hour hotline) go to [facility] [address] [place] [phone]) call 911 if experiencing a Mental Health or Behavioral Health Crisis  Utilize healthy coping skills  and supportive resources discussed Contact PCP with any questions or concerns Keep 90 percent of counseling appointments Call your insurance provider for more information about your Enhanced Benefits  Check out counseling resources provided  Begin personal counseling with LCSW, to reduce and manage symptoms of Depression and Stress, until well-established with mental health provider Accept all calls from representative with [facility] in an effort to establish ongoing mental health counseling and supportive services. Incorporate into daily practice - relaxation techniques, deep breathing exercises, and mindfulness meditation strategies. Talk about feelings with friends, family members, spiritual advisor, etc. Contact LCSW directly [phone]), if you have questions, need assistance, or if additional social work needs are identified between now and our next scheduled telephone outreach call. Call 988 for mental health hotline/crisis line if needed (24/7 available) Try techniques to reduce symptoms of anxiety/negative thinking (deep breathing, distraction, positive self talk, etc)  - develop a personal safety plan - develop a plan to deal with triggers like holidays, anniversaries - exercise at least 2 to 3 times per week - have a plan for how to handle bad days - journal feelings and what helps to feel better or worse - spend time or talk with others at least 2 to 3 times per week - watch for early signs of feeling worse - begin personal counseling - call and visit an old friend - check out volunteer opportunities - join a support group - laugh; watch a funny movie or comedian - learn and use visualization or guided imagery - perform a random act of kindness - practice relaxation or meditation daily - start or continue a personal journal - practice positive thinking and self-talk -continue with compliance of taking medication  -identify current effective and ineffective coping strategies.   -implement positive self-talk in care to increase self-esteem, confidence and feelings of control.  -consider alternative and complementary therapy approaches such as meditation, mindfulness or yoga.  Call your insurance provider to gain education on benefits if desired Call your primary care doctor if symptoms get worse  -journaling, prayer, worship services, meditation or pastoral counseling.  -increase participation in pleasurable group activities such as hobbies, singing, sports or volunteering).  -consider the use of meditative movement therapy such as tai chi, yoga or qigong.  -start a regular daily exercise program based on tolerance, ability and patient choice to support positive thinking and activity    Follow Up Plan:  The patient has been provided with contact information for the care management team and has been advised to call with any mental health or health related questions or concerns.  The care management team will reach out to the patient again over the next 30 business  days.   If you are experiencing a Mental Health or Behavioral Health Crisis or need someone to talk to, please call the Suicide and Crisis Lifeline: 988      24- Hour Availability:    [facility]  [address] [place], [place] [phone] Crisis [phone]   [facility] [phone]  [place] [facility]  [phone]    [facility]  [phone] (after hours)   Therapeutic Alternative/Mobile Crisis   [phone]   [place]  National Suicide Hotline  423-528-3954 (TALK) OR 988   Call 988 for mental health emergencies   [employer]  586-264-0512);  [place] and [employer]  (508)117-4775); [place], [place], [place], [place], [place], [place], [place]    [place] [facility] for [facility] Residents For 24/7 walk-up access to mental health services for  [facility] children (4+), adolescents and adults, please visit the [facility] located at [address] in [place], [place].  * also provides comprehensive outpatient behavioral health services in a variety of locations around the [place].  Connect With [place] [address] [place], [place] [address] HelpLine: (310)288-8370 or 1-817-782-8632  Get Directions  Find Help 24/7 By Phone Call our 24-hour HelpLine at (734)479-8760 or (305) 516-2965 for immediate assistance for mental health and substance abuse issues.  Walk-In Help [place]: [facility] (Ages 4 and Up) [place]: Emergency Dept., [facility] Additional Resources National Hopeline Network: 1-800-SUICIDE The National Suicide Prevention Lifeline: 1-800-273-TALK      Dickie La, BSW, MSW, LCSW Managed Medicaid LCSW [facility]  [facility] [email or web address] Phone: [phone]

## 2023-03-01 NOTE — Patient Outreach (Signed)
Medicaid Managed Care Social Work Note  03/01/2023 Name:  Phillip Gill MRN:  [id number] DOB:  [birth year]  Phillip Gill is an 47 y.o. year old male who is a primary patient of Garnette Gunner, MD.  The Medicaid Managed Care Coordination team was consulted for assistance with:  Mental Health Counseling and Resources  [name] was given information about Medicaid Managed Care Coordination team services today. Phillip Gill Patient agreed to services and verbal consent obtained.  Engaged with patient  for by telephone forfollow up visit in response to referral for case management and/or care coordination services.   Assessments/Interventions:  Review of past medical history, allergies, medications, health status, including review of consultants reports, laboratory and other test data, was performed as part of comprehensive evaluation and provision of chronic care management services.  SDOH: (Social Determinant of Health) assessments and interventions performed: SDOH Interventions    Flowsheet Row Patient Outreach Telephone from 03/01/2023 in [place] [facility] Patient Outreach Telephone from 02/13/2023 in [place] POPULATION [facility]ALTH DEPARTMENT Office Visit from 02/05/2023 in [facility] [employer]  SDOH Interventions     Food Insecurity Interventions -- -- [id number] Referral  Housing Interventions Intervention Not Indicated  [facility] BSW involved] -- [id number] Referral, AMB Referral  Depression Interventions/Treatment  -- Referral to Psychiatry, Counseling --  Financial Strain Interventions -- -- [id number] Referral, Artist  Stress Interventions Offered [employer], Provide Counseling Offered [employer], Provide Counseling  [Homeless] --       Advanced Directives Status:  See Care Plan for related entries.  Care Plan                 Allergies  Allergen Reactions   Bactrim  [Sulfamethoxazole W/Trimethoprim (Co-Trimoxazole)] Hives and Shortness Of Breath   Sulfa Antibiotics Hives and Shortness Of Breath   Cymbalta [Duloxetine Hcl] Diarrhea   Duloxetine Diarrhea   Other Hives and Swelling    [employer] toothpaste    Zoloft [Sertraline Hcl] Diarrhea   Clindamycin/Lincomycin Rash   Neurontin [Gabapentin] Rash    Medications Reviewed Today   Medications were not reviewed in this encounter     Patient Active Problem List   Diagnosis Date Noted   Passive suicidal ideations 02/05/2023   OSA (obstructive sleep apnea) 02/05/2023   MDD (major depressive disorder) 12/19/2022   Urinary frequency 12/18/2022   Left foot pain 12/18/2022   Poor social situation 12/18/2022   Snoring 01/22/2022   Encounter for physical examination related to employment 09/26/2021   Human monkeypox 04/06/2021   Cervical pain 02/07/2021   Erectile dysfunction 08/29/2020   Healthcare maintenance 04/05/2020   Foot lesion 12/24/2019   Human immunodeficiency virus (HIV) disease (HCC) 08/25/2019   Gonorrhea 02/11/2019   Hepatitis B surface antigen positive 09/05/2018   Overweight 07/20/2015   Colitis 03/08/2015   HIV (human immunodeficiency virus infection) (HCC)    Tobacco abuse    LGV (lymphogranuloma venereum)    Primary syphilis    Chronic hepatitis B (HCC) 10/03/2014   HTN (hypertension)    Gynecomastia 04/30/2014   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   Chronic pain associated with significant psychosocial dysfunction 09/18/2012   Fibromyalgia muscle pain 02/11/2012   Depression 12/07/2011   Anxiety 02/02/2011    Conditions to be addressed/monitored per PCP order:  Depression  Care Plan : LCSW Plan of Care  Updates made by Gustavus Bryant, LCSW since 03/01/2023 12:00 AM     Problem: Depression Identification (Depression)  Goal: Depressive Symptoms Identified   Note:   Priority: High  Timeframe:  Short-Range Goal Priority:  High Start Date:   02/13/23        Expected  End Date:  ongoing                     Follow Up Date--04/01/23 at 3 pm  - keep 90 percent of scheduled appointments -consider counseling or psychiatry -consider bumping up your self-care  -consider creating a stronger support network   Why is this important?             Combatting depression may take some time.            If you don't feel better right away, don't give up on your treatment plan.    Current barriers:   Chronic Mental Health needs related to depression, stress and anxiety. Patient requires Support, Education, Resources, Referrals, Advocacy, and Care Coordination, in order to meet Unmet Mental Health Needs. (To Find a Visual merchandiser) Patient will implement clinical interventions discussed today to decrease symptoms of depression and increase knowledge and/or ability of: coping skills. Homeless Limited Support Network Mental Health Concerns and Social Isolation Patient lacks knowledge of available community counseling agencies and resources.  Clinical Goal(s): verbalize understanding of plan for management of Anxiety, Depression, and Stress and demonstrate a reduction in symptoms. Patient will connect with a provider for ongoing mental health treatment, increase coping skills, healthy habits, self-management skills, and stress reduction        Clinical Interventions:  Assessed patient's previous and current treatment, coping skills, support system and barriers to care. Patient provided hx  Verbalization of feelings encouraged, motivational interviewing employed Emotional support provided, positive coping strategies explored. Establishing healthy boundaries emphasized and healthy self-care education provided Patient was educated on available mental health resources within their area that accept Medicaid and offer counseling and psychiatry. PCP has already made referrals for both services. He started therapy at [facility] [place] on 02/11/23 and a  referral was made to [facility]. Patient and [facility] LCSW contacted [facility] and was informed that he will have to go to [facility] for treatment due to having Medicaid and so his 02/05/23 referral was transferred there today on 02/13/23.  Patient started taking Abilify today and is already on Lexapro. Patient educated on the difference between therapy and psychiatry per patient request Email sent to patient today with available mental health resources within his area that accept Medicaid. Emotional support provided. CBT intervention implemented regarding "being mentally fit" by combating negative thinking and replacing it with uplifting support, hope and positivity. Patient reports that his brother, cousin and aunt are his main support but that they have their own lives with their own problems and he often times feels like a burden to them.  Patient reports significant worsening anxiety and depression impacting his ability to function appropriately and carry out daily task. Assessed social determinant of health barriers LCSW provided education on relaxation techniques such as meditation, deep breathing, massage, grounding exercises or yoga that can activate the body's relaxation response and ease symptoms of stress and anxiety. LCSW ask that when pt is struggling with difficult emotions and racing thoughts that they start this relaxation response process. LCSW provided extensive education on healthy coping skills for anxiety. SW used active and reflective listening, validated patient's feelings/concerns, and provided emotional support. Patient will work on implementing appropriate self-care habits into their daily routine such as: staying positive,  writing a gratitude list, drinking water, staying active around the house, taking their medications as prescribed, combating negative thoughts or emotions and staying connected with their family and friends. Positive reinforcement provided for this decision to work on  this. Motivational Interviewing employed Depression screen reviewed  PHQ2/ PHQ9 completed or reviewed  Mindfulness or Relaxation training provided Active listening / Reflection utilized  Advance Care and HCPOA education provided Emotional Support Provided Problem Solving /Task Center strategies reviewed Provided psychoeducation for mental health needs  Provided brief CBT  Reviewed mental health medications and discussed importance of compliance:  Quality of sleep assessed & Sleep Hygiene techniques promoted  Participation in counseling encouraged  Verbalization of feelings encouraged  Suicidal Ideation/Homicidal Ideation assessed: Patient denies SI/HI  Review resources, discussed options and provided patient information about  Mental Health Resources Inter-disciplinary care team collaboration (see longitudinal plan of care) Patient reports that he is already involved with therapy at [facility] the [place] but would like for all of his mental health providers to be at the same place. Patient has an upcoming psychiatry appointment scheduled for October of 2024 but is not scheduled with therapy as a referral has not been made yet for this specific service. Patient provides verbal permission for [facility] LCSW to place a new referral to [facility] for therapy. Referral successfully placed on 03/01/23 and an email was sent to the patient in case he wishes to contact agency to go ahead and get scheduled for counseling. [facility] LCSW will follow up in 30 days to reassess social work needs. Advised patient to try to be active for 10-20 minutes per day, go outside in the sunlight everyday for at least 10-15 minutes, drink 6-8 glasses of water a day, take vitamins/medications as directed and try to increase his sleep. [facility] LCSW reminded patient of his upcoming scheduled appointments. Patient was provided brief stress management coping skill education.   Patient Goals/Self-Care Activities: Take medications as  prescribed   Attend all scheduled provider appointments Call pharmacy for medication refills 3-7 days in advance of running out of medications Perform all self care activities independently  Perform IADL's (shopping, preparing meals, housekeeping, managing finances) independently Call provider office for new concerns or questions Work with the social worker to address care coordination needs and will continue to work with the clinical team to address health care and disease management related needs call 1-800-273-TALK (toll free, 24 hour hotline) go to [facility] [address], [place] [phone]) call 911 if experiencing a Mental Health or Behavioral Health Crisis  Utilize healthy coping skills and supportive resources discussed Contact PCP with any questions or concerns Keep 90 percent of counseling appointments Call your insurance provider for more information about your Enhanced Benefits  Check out counseling resources provided  Begin personal counseling with LCSW, to reduce and manage symptoms of Depression and Stress, until well-established with mental health provider Accept all calls from representative with [facility] in an effort to establish ongoing mental health counseling and supportive services. Incorporate into daily practice - relaxation techniques, deep breathing exercises, and mindfulness meditation strategies. Talk about feelings with friends, family members, spiritual advisor, etc. Contact LCSW directly [phone]), if you have questions, need assistance, or if additional social work needs are identified between now and our next scheduled telephone outreach call. Call 988 for mental health hotline/crisis line if needed (24/7 available) Try techniques to reduce symptoms of anxiety/negative thinking (deep breathing, distraction, positive self talk, etc)  - develop a personal  safety plan - develop a plan to deal with triggers like  holidays, anniversaries - exercise at least 2 to 3 times per week - have a plan for how to handle bad days - journal feelings and what helps to feel better or worse - spend time or talk with others at least 2 to 3 times per week - watch for early signs of feeling worse - begin personal counseling - call and visit an old friend - check out volunteer opportunities - join a support group - laugh; watch a funny movie or comedian - learn and use visualization or guided imagery - perform a random act of kindness - practice relaxation or meditation daily - start or continue a personal journal - practice positive thinking and self-talk -continue with compliance of taking medication  -identify current effective and ineffective coping strategies.  -implement positive self-talk in care to increase self-esteem, confidence and feelings of control.  -consider alternative and complementary therapy approaches such as meditation, mindfulness or yoga.  Call your insurance provider to gain education on benefits if desired Call your primary care doctor if symptoms get worse  -journaling, prayer, worship services, meditation or pastoral counseling.  -increase participation in pleasurable group activities such as hobbies, singing, sports or volunteering).  -consider the use of meditative movement therapy such as tai chi, yoga or qigong.  -start a regular daily exercise program based on tolerance, ability and patient choice to support positive thinking and activity    Follow Up Plan:  The patient has been provided with contact information for the care management team and has been advised to call with any mental health or health related questions or concerns.  The care management team will reach out to the patient again over the next 30 business  days.   If you are experiencing a Mental Health or Behavioral Health Crisis or need someone to talk to, please call the Suicide and Crisis Lifeline: 988         Follow up:  Patient agrees to Care Plan and Follow-up.  Plan: The Managed Medicaid care management team will reach out to the patient again over the next 30 days.  Dickie La, BSW, MSW, [employer] Managed Medicaid LCSW [facility]  [facility] [email or web address] Phone: [phone]

## 2023-03-05 ENCOUNTER — Ambulatory Visit (INDEPENDENT_AMBULATORY_CARE_PROVIDER_SITE_OTHER): Payer: Medicaid Other | Admitting: Family Medicine

## 2023-03-05 ENCOUNTER — Encounter: Payer: Self-pay | Admitting: Family Medicine

## 2023-03-05 VITALS — BP 128/82 | HR 80 | Temp 97.9°F | Wt 218.2 lb

## 2023-03-05 DIAGNOSIS — Z006 Encounter for examination for normal comparison and control in clinical research program: Secondary | ICD-10-CM | POA: Diagnosis not present

## 2023-03-05 DIAGNOSIS — K219 Gastro-esophageal reflux disease without esophagitis: Secondary | ICD-10-CM

## 2023-03-05 DIAGNOSIS — Z1211 Encounter for screening for malignant neoplasm of colon: Secondary | ICD-10-CM

## 2023-03-05 DIAGNOSIS — K5909 Other constipation: Secondary | ICD-10-CM

## 2023-03-05 DIAGNOSIS — F332 Major depressive disorder, recurrent severe without psychotic features: Secondary | ICD-10-CM

## 2023-03-05 DIAGNOSIS — F330 Major depressive disorder, recurrent, mild: Secondary | ICD-10-CM | POA: Diagnosis not present

## 2023-03-05 MED ORDER — ESOMEPRAZOLE MAGNESIUM 40 MG PO CPDR
40.0000 mg | DELAYED_RELEASE_CAPSULE | Freq: Every morning | ORAL | 0 refills | Status: DC
Start: 2023-03-05 — End: 2023-03-28

## 2023-03-05 MED ORDER — POLYETHYLENE GLYCOL 3350 17 GM/SCOOP PO POWD
17.0000 g | Freq: Every day | ORAL | 0 refills | Status: DC
Start: 2023-03-05 — End: 2023-08-04
  Filled 2023-03-28: qty 238, 14d supply, fill #0
  Filled 2023-04-13: qty 238, 14d supply, fill #1

## 2023-03-05 MED ORDER — ARIPIPRAZOLE 5 MG PO TABS
5.0000 mg | ORAL_TABLET | Freq: Every day | ORAL | 0 refills | Status: DC
Start: 2023-03-05 — End: 2023-05-27
  Filled 2023-03-28: qty 30, 30d supply, fill #0
  Filled 2023-04-13 – 2023-04-28 (×2): qty 30, 30d supply, fill #1

## 2023-03-05 NOTE — Assessment & Plan Note (Signed)
Start esomeprazole 40 mg daily, 30 minutes before breakfast. Perform H. pylori breath test. Refer to Gastroenterology for further evaluation, including potential endoscopy and colorectal cancer screening.

## 2023-03-05 NOTE — Progress Notes (Signed)
Assessment/Plan:   Problem List Items Addressed This Visit       Digestive   Gastroesophageal reflux disease    Start esomeprazole 40 mg daily, 30 minutes before breakfast. Perform H. pylori breath test. Refer to Gastroenterology for further evaluation, including potential endoscopy and colorectal cancer screening.      Relevant Medications   esomeprazole (NEXIUM) 40 MG capsule   polyethylene glycol powder (GLYCOLAX/MIRALAX) 17 GM/SCOOP powder   Other Relevant Orders   H. pylori breath test   Ambulatory referral to Gastroenterology     Other   Other constipation    Start polyethylene glycol (MiraLAX) 17 grams daily. Follow-up with gastroenterology.      Relevant Medications   polyethylene glycol powder (GLYCOLAX/MIRALAX) 17 GM/SCOOP powder   MDD (major depressive disorder), recurrent severe, without psychosis (HCC) - Primary    Worsening, with anxiety Continue escitalopram 20 mg daily. Increase Abilify to 5 mg daily. If financial constraints prevent new prescription, double 2 mg pills to 4 mg until new prescription is affordable. Provide suicide prevention resources and guidance for urgent psychiatric needs. Follow up with psychiatry appointment and consider starting therapy. Follow-up in 8 weeks to assess treatment efficacy.      Relevant Medications   ARIPiprazole (ABILIFY) 5 MG tablet   Other Visit Diagnoses     Research study patient       Relevant Orders   [facility] Molecular Screen-Saliva   Screening for colon cancer       Relevant Orders   Ambulatory referral to Gastroenterology       Medications Discontinued During This Encounter  Medication Reason   ARIPiprazole (ABILIFY) 2 MG tablet     Return in about 8 weeks (around 04/30/2023) for heartburn.    Subjective:   Encounter date: 03/05/2023  Phillip Gill is a 47 y.o. male who has Anxiety; Other constipation; Depression; Fibromyalgia muscle pain; Anal condyloma; Gynecomastia; HTN  (hypertension); Chronic hepatitis B (HCC); LGV (lymphogranuloma venereum); Primary syphilis; Colitis; HIV (human immunodeficiency virus infection) (HCC); Tobacco abuse; Overweight; Chronic pain associated with significant psychosocial dysfunction; Gonorrhea; Human immunodeficiency virus (HIV) disease (HCC); Foot lesion; Hepatitis B surface antigen positive; Healthcare maintenance; Erectile dysfunction; Cervical pain; Human monkeypox; Encounter for physical examination related to employment; Snoring; Urinary frequency; Left foot pain; Poor social situation; MDD (major depressive disorder); Passive suicidal ideations; OSA (obstructive sleep apnea); MDD (major depressive disorder), recurrent severe, without psychosis (HCC); and Gastroesophageal reflux disease on their problem list..   He  has a past medical history of Anxiety, Arthritis, Chronic hepatitis B (HCC), Chronic lower back pain, Depression, Fibromyalgia, Genital warts, HIV disease (HCC) (02/28/2015), HIV infection (HCC), Hypertension, IBS (irritable bowel syndrome), Migraine, Overweight (07/20/2015), and Renal insufficiency (12/29/2013)..   Chief Complaint: Follow-up for anxiety, depression, and new concerns about heartburn.  Subjective:  History of Present Illness:  Depression and Anxiety:  Patient is returning for a follow-up on his depression and anxiety. He reports initial difficulty in obtaining Abilify (2 mg daily) due to financial constraints but started taking it approximately a week and a half ago. Additionally, he resumed escitalopram (20 mg daily) around the same time after a similar period of discontinuation. He reports some improvement in mood but continues to have severe depressive thoughts and passive suicidal ideation. He has an upcoming psychiatry appointment on April 15, 2023, and has been contacted about starting therapy.  Heartburn: Patient reports heartburn symptoms that have persisted for about a year, with recent  exacerbation possibly due to anxiety and  stress. Tums provide minimal benefit. Occasional dysphagia is noted, with no vomiting of blood, but he mentions intermittent alternating constipation and diarrhea, but none recently.     03/05/2023   11:08 AM 02/13/2023    2:20 PM 02/05/2023   10:46 AM 01/17/2023   10:02 AM 12/17/2022    9:48 AM  Depression screen PHQ 2/9  Decreased Interest 3 3 3  0 0  Down, Depressed, Hopeless 3 3 3 1  0  PHQ - 2 Score 6 6 6 1  0  Altered sleeping 3 3 3     Tired, decreased energy 3 3 3     Change in appetite 3 3 3     Feeling bad or failure about yourself  3 3 3     Trouble concentrating 3 3 3     Moving slowly or fidgety/restless 3 1 3     Suicidal thoughts 3 0 3    PHQ-9 Score 27 22 27     Difficult doing work/chores Extremely dIfficult Very difficult         03/05/2023   11:08 AM 02/05/2023   10:46 AM  GAD 7 : Generalized Anxiety Score  Nervous, Anxious, on Edge 3 3  Control/stop worrying 3 3  Worry too much - different things 3 3  Trouble relaxing 3 3  Restless 3 3  Easily annoyed or irritable 3 3  Afraid - awful might happen 3 3  Total GAD 7 Score 21 21  Anxiety Difficulty Extremely difficult      Review of Systems  Constitutional:  Negative for chills, diaphoresis, fever, malaise/fatigue and weight loss.  HENT:  Negative for congestion, ear discharge, ear pain and hearing loss.   Eyes:  Negative for blurred vision, double vision, photophobia, pain, discharge and redness.  Respiratory:  Negative for cough, sputum production, shortness of breath and wheezing.   Cardiovascular:  Negative for chest pain and palpitations.  Gastrointestinal:  Positive for constipation, diarrhea and heartburn. Negative for abdominal pain, blood in stool, melena, nausea and vomiting.  Genitourinary:  Negative for dysuria, flank pain, frequency, hematuria and urgency.  Musculoskeletal:  Negative for myalgias.  Skin:  Negative for itching and rash.  Neurological:  Negative for  dizziness, tingling, tremors, speech change, seizures, loss of consciousness, weakness and headaches.  Psychiatric/Behavioral:  Positive for suicidal ideas (No plan, no HI). Negative for depression, hallucinations, memory loss and substance abuse. The patient does not have insomnia.   All other systems reviewed and are negative.   Past Surgical History:  Procedure Laterality Date   CO2 LASER APPLICATION N/A 02/11/2013   Procedure: CO2 LASER APPLICATION;  Surgeon: Romie Levee, MD;  Location: [facility];  Service: General;  Laterality: N/A;   FOOT SURGERY     HIGH RESOLUTION ANOSCOPY N/A 02/11/2013   Procedure: HIGH RESOLUTION ANOSCOPY WITH BIOPSY, LASER ABLATION;  Surgeon: Romie Levee, MD;  Location: [facility];  Service: General;  Laterality: N/A;   WISDOM TOOTH EXTRACTION      Outpatient Medications Prior to Visit  Medication Sig Dispense Refill   amLODipine (NORVASC) 10 MG tablet TAKE 1 TABLET(10 MG) BY MOUTH DAILY 90 tablet 1   bictegravir-emtricitabine-tenofovir AF (BIKTARVY) 50-200-25 MG TABS tablet TAKE 1 TABLET BY MOUTH EVERY DAY 90 tablet 1   diclofenac Sodium (VOLTAREN) 1 % GEL Apply 4 g topically 4 (four) times daily. To affected joint. 100 g 11   escitalopram (LEXAPRO) 20 MG tablet Take 1 tablet (20 mg total) by mouth daily. 30 tablet 5   finasteride (  PROSCAR) 5 MG tablet Take 1 tablet (5 mg total) by mouth daily. 30 tablet 1   sildenafil (VIAGRA) 100 MG tablet Take 0.5-1 tablets (50-100 mg total) by mouth daily as needed for erectile dysfunction. Do not exceed 1 tablet per day. 30 tablet 1   ARIPiprazole (ABILIFY) 2 MG tablet Take 1 tablet (2 mg total) by mouth daily. 90 tablet 0   acetaminophen (TYLENOL) 500 MG tablet Take 1 tablet (500 mg total) by mouth every 6 (six) hours as needed. (Patient not taking: Reported on 02/05/2023) 30 tablet 0   AMBULATORY NON FORMULARY MEDICATION TENS unit. Back pain Disp TENS unit and accessories. (Patient not  taking: Reported on 03/05/2023) 1 each 0   ondansetron (ZOFRAN) 4 MG tablet Take 1 tablet (4 mg total) by mouth every 6 (six) hours. (Patient not taking: Reported on 01/17/2023) 12 tablet 0   No facility-administered medications prior to visit.    Family History  Problem Relation Age of Onset   Hypertension Mother    Diabetes Mother    Stroke Mother        cerbral aneurysm   Hypertension Brother    Sleep apnea Cousin    Mental illness Neg Hx     Social History   Socioeconomic History   Marital status: Single    Spouse name: Not on file   Number of children: Not on file   Years of education: Not on file   Highest education level: Not on file  Occupational History   Not on file  Tobacco Use   Smoking status: Former    Current packs/day: 0.00    Average packs/day: 1 pack/day for 18.0 years (18.0 ttl pk-yrs)    Types: Cigarettes    Start date: 07/03/2003    Quit date: 07/02/2021    Years since quitting: 1.6    Passive exposure: Never   Smokeless tobacco: Never   Tobacco comments:    Pt vapes everyday  Vaping Use   Vaping status: Every Day   Substances: Nicotine  Substance and Sexual Activity   Alcohol use: Yes    Comment: occ   Drug use: No   Sexual activity: Not on file    Comment: accepted condoms  Other Topics Concern   Not on file  Social History Narrative   Grew up in [place], now living in [place],-  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    Finished HS.   Doing online classes with [employer]- studying accounting   Previously 2 years of classes at [employer].    Has Partner- [name]- together for 1 year.    Has 2 girls- (born in 2000).          Social Determinants of Health   Financial Resource Strain: High Risk (02/05/2023)   Overall Financial Resource Strain (CARDIA)    Difficulty of Paying Living Expenses: Very hard  Food Insecurity: Food Insecurity Present (02/05/2023)   Hunger Vital Sign    Worried About Running Out of Food in the Last  Year: Sometimes true    Ran Out of Food in the Last Year: Sometimes true  Transportation Needs: Unmet Transportation Needs (02/05/2023)   PRAPARE - Administrator, Civil Service (Medical): Yes    Lack of Transportation (Non-Medical): Yes  Physical Activity: Sufficiently Active (07/16/2018)   Received from [facility], [facility]   Exercise Vital Sign    Days of Exercise per Week: 4 days    Minutes  of Exercise per Session: 150+ min  Stress: Stress Concern Present (03/01/2023)   [employer] of Occupational Health - Occupational Stress Questionnaire    Feeling of Stress : To some extent  Social Connections: Unknown (05/23/2022)   Received from [facility], [facility]   Social Network    Social Network: Not on file  Intimate Partner Violence: Unknown (05/23/2022)   Received from [facility], [facility]   HITS    Physically Hurt: Not on file    Insult or Talk Down To: Not on file    Threaten Physical Harm: Not on file    Scream or Curse: Not on file                                                                                                  Objective:  Physical Exam: BP 128/82 (BP Location: Left Arm, Patient Position: Sitting, Cuff Size: Large)   Pulse 80   Temp 97.9 F (36.6 C) (Temporal)   Wt 218 lb 3.2 oz (99 kg)   SpO2 95%   BMI 29.59 kg/m     Physical Exam Constitutional:      Appearance: Normal appearance.  HENT:     Head: Normocephalic and atraumatic.     Right Ear: Hearing normal.     Left Ear: Hearing normal.     Nose: Nose normal.  Eyes:     General: No scleral icterus.       Right eye: No discharge.        Left eye: No discharge.     Extraocular Movements: Extraocular movements intact.  Cardiovascular:     Rate and Rhythm: Normal rate and regular rhythm.     Heart sounds: Normal heart sounds.  Pulmonary:     Effort: Pulmonary effort is normal.     Breath sounds: Normal breath sounds.   Abdominal:     Palpations: Abdomen is soft.     Tenderness: There is abdominal tenderness in the epigastric area. There is no guarding or rebound.  Skin:    General: Skin is warm.     Findings: No rash.  Neurological:     General: No focal deficit present.     Mental Status: He is alert.     Cranial Nerves: No cranial nerve deficit.  Psychiatric:        Mood and Affect: Mood is anxious and depressed.        Behavior: Behavior normal. Behavior is cooperative.        Thought Content: Thought content includes suicidal ideation. Thought content does not include homicidal ideation. Thought content does not include homicidal or suicidal plan.        Judgment: Judgment normal.     DG Lumbar Spine 2-3 Views  Result Date: 01/23/2023 CLINICAL DATA:  eval low back pain EXAM: LUMBAR SPINE - 2-3 VIEW COMPARISON:  December 30, 2019. FINDINGS: There are five non-rib bearing lumbar-type vertebral bodies. There is normal alignment. There is no evidence for acute fracture or subluxation. Intervertebral disc spaces are preserved without significant degenerative changes. Visualized abdomen is unremarkable. IMPRESSION: Negative.  Electronically Signed   By: Meda Klinefelter M.D.   On: 01/23/2023 19:58   [place] LIMITED JOINT SPACE STRUCTURES LOW LEFT(NO LINKED CHARGES)  Result Date: 12/26/2022 Diagnostic Limited MSK Ultrasound of: Left plantar foot. Intact flexor houses longus tendon.  Slight hypoechoic fluid collecting superficial to the tendon at the area of prior surgery. 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   DG Cervical Spine 2 or 3 views  Result Date: 12/23/2022 CLINICAL DATA:  neck pain EXAM: CERVICAL SPINE - 3 VIEW COMPARISON:  None Available. FINDINGS: No fracture, dislocation or subluxation. No spondylolisthesis. No  osteolytic or osteoblastic changes. Prevertebral and cervical cranial soft tissues are unremarkable. Calcification of the anterior annuli identified at C2-3, C4-5 and C5-6 consistent with early degenerative changes. IMPRESSION: Early/mild degenerative changes. No acute osseous abnormalities. Electronically Signed   By: Layla Maw M.D.   On: 12/23/2022 22:49    Recent Results (from the past 2160 hour(s))  COMPLETE METABOLIC PANEL WITH GFR     Status: Abnormal   Collection Time: 12/17/22 10:30 AM  Result Value Ref Range   Glucose, Bld 108 (H) 65 - 99 mg/dL    Comment: .            Fasting reference interval . For someone without known diabetes, a glucose value between 100 and 125 mg/dL is consistent with prediabetes and should be confirmed with a follow-up test. .    BUN 12 7 - 25 mg/dL   Creat 0.27 2.53 - 6.64 mg/dL   eGFR 84 > OR = 60 QI/HKV/4.25Z5   BUN/Creatinine Ratio SEE NOTE: 6 - 22 (calc)    Comment:    Not Reported: BUN and Creatinine are within    reference range. .    Sodium 139 135 - 146 mmol/L   Potassium 4.1 3.5 - 5.3 mmol/L   Chloride 105 98 - 110 mmol/L   CO2 29 20 - 32 mmol/L   Calcium 9.9 8.6 - 10.3 mg/dL   Total Protein 8.4 (H) 6.1 - 8.1 g/dL   Albumin 4.5 3.6 - 5.1 g/dL   Globulin 3.9 (H) 1.9 - 3.7 g/dL (calc)   AG Ratio 1.2 1.0 - 2.5 (calc)   Total Bilirubin 0.5 0.2 - 1.2 mg/dL   Alkaline phosphatase (APISO) 80 36 - 130 U/L   AST 27 10 - 40 U/L   ALT 30 9 - 46 U/L  HIV-1 RNA quant-no reflex-bld     Status: Abnormal   Collection Time: 12/17/22 10:30 AM  Result Value Ref Range   HIV 1 RNA Quant 82 (H) Copies/mL   HIV-1 RNA Quant, Log 1.91 (H) Log cps/mL    Comment: . Reference Range:                           Not Detected     copies/mL                           Not Detected Log copies/mL . Marland Kitchen The test was performed using Real-Time Polymerase Chain Reaction. . . Reportable Range: 20 copies/mL to 10,000,000 copies/mL (1.30 Log copies/mL to 7.00  Log copies/mL). .   T-helper cells (CD4) count (not at [facility])     Status: None   Collection Time: 12/17/22 10:30 AM  Result Value Ref Range   CD4 T Helper % 32 30 - 61 %   Absolute CD4 1,185 490 - 1,740 cells/uL   Total lymphocyte count 3,742 850 - 3,900 cells/uL    Comment: The analytical performance characteristics of this assay  have been determined by [employer]. The  modifications have not been cleared or approved by the  FDA. This assay has been validated pursuant to the CLIA  regulations and is used for clinical purposes.   Hepatitis B DNA, ultraquantitative, PCR     Status: Abnormal   Collection Time: 12/17/22 10:30 AM  Result Value Ref Range   Hepatitis B DNA <10 Detected (H) IU/mL    Comment: . HBV DNA was detected below 10 IU/mL. Viral nucleic acid detected below this level cannot be quantified by the assay. .    Hepatitis B virus DNA <1.00 Detected (H) Log IU/mL    Comment: . Reference Range:                          Not Detected       IU/mL                          Not Detected   Log IU/mL . The analytical performance characteristics of this assay have been determined by [employer]. The modifications have not been cleared or approved by the U.S. Food and Drug Administration. This assay has been validated pursuant to the CLIA regulations and is used for clinical purposes. .   Urinalysis, Routine w reflex microscopic     Status: None   Collection Time: 12/17/22 10:37 AM  Result Value Ref Range   Color, Urine YELLOW YELLOW   APPearance CLEAR CLEAR   Specific Gravity, Urine 1.017 1.001 - 1.035   pH < OR = 5.0 5.0 - 8.0   Glucose, UA NEGATIVE NEGATIVE   Bilirubin Urine NEGATIVE NEGATIVE   Ketones, ur NEGATIVE NEGATIVE   Hgb urine dipstick NEGATIVE NEGATIVE   Protein, ur NEGATIVE NEGATIVE   Nitrite NEGATIVE NEGATIVE   Leukocytes,Ua NEGATIVE NEGATIVE        Garner Nash, MD, MS

## 2023-03-05 NOTE — Assessment & Plan Note (Signed)
Start polyethylene glycol (MiraLAX) 17 grams daily. Follow-up with gastroenterology.

## 2023-03-05 NOTE — Patient Instructions (Signed)
Medications:  Escitalopram: Continue taking 20 mg daily. Abilify: For now, take 2 of the 2 mg pills daily (total 4 mg daily). Once you run out of the 2 mg pills, switch to the new prescription: 5 mg daily.  Esomeprazole: Start taking 40 mg each morning, 30 minutes before breakfast.  Tests and Referrals:  H. Pylori Breath Test: Schedule and complete this test to check for stomach infection.  Gastroenterology Referral: Schedule an appointment for a colonoscopy (due for screening). Consider an endoscopy for heartburn evaluation. Follow-up with Psychiatry: Appointment scheduled for October 14 with Dr. Sandria Manly. Behavioral Health Therapy: Reach out to schedule therapy sessions as discussed.  Monitoring and Self-care:  Heartburn: Monitor symptoms and report any worsening or new symptoms such as blood in stool, difficulty swallowing, or severe pain. Mental Health: Continue taking prescribed medication regularly. Contact healthcare provider or emergency services immediately if experiencing thoughts of self-harm. Shaking/Anxiety: Monitor shaking and anxiety symptoms. Note any changes or new symptoms and discuss at your next appointment.  Additional Notes:  If unable to afford medication, use the remaining 2 mg Abilify pills by doubling the dose (4 mg daily) until they are finished before starting the 5 mg pills. Utilize Medicaid care manager assistance for financial and medication access support.  Emergency Contact:  If you have thoughts of self-harm or your condition worsens, seek immediate help or contact the provided emergency resources.  For urgent psychiatric assistance you can go to   [facility] Phone: [phone]  [address]. [place], [place] [address]  Hours: Open 24/7, No appointment required.  [facility]  [address] [place], [place] [address] Phone: [phone]  When you're going through tough times, it's easy to feel lonely and  overwhelmed. Remember that YOU ARE NOT ALONE and we at [facility] want to support you during this difficult time.  There are many ways to get help and support when you are ready.  You can call the following help lines: - National suicide hotline ((731)186-8578) - National Hopeline Network (1-800-SUICIDE)   You can schedule an appointment with a team member with your primary care doc or one of our counselors.  We are all here to support you.  A doctor is on call 24/7   There are many treatments that can help people during difficult times, and we can talk with you about medications, counseling, diet, and other options. We want to offer you HOPE that it won't always feel this bad.   If you are seriously thinking about hurting yourself or have a plan, please call 911 or go to any emergency room right away for immediate help.

## 2023-03-05 NOTE — Assessment & Plan Note (Addendum)
Worsening, with anxiety Continue escitalopram 20 mg daily. Increase Abilify to 5 mg daily. If financial constraints prevent new prescription, double 2 mg pills to 4 mg until new prescription is affordable. Provide suicide prevention resources and guidance for urgent psychiatric needs. Follow up with psychiatry appointment and consider starting therapy. Follow-up in 8 weeks to assess treatment efficacy.

## 2023-03-06 NOTE — Telephone Encounter (Addendum)
Spoke to patient made pt appointment for 03/07/2023  with Dr Frances Furbish for restart cpap   Pt thanked  me for calling

## 2023-03-07 ENCOUNTER — Ambulatory Visit: Payer: Medicaid Other | Admitting: Neurology

## 2023-03-07 ENCOUNTER — Other Ambulatory Visit: Payer: Medicaid Other

## 2023-03-07 ENCOUNTER — Encounter: Payer: Self-pay | Admitting: Neurology

## 2023-03-07 VITALS — BP 118/79 | HR 74 | Ht 72.0 in | Wt 220.0 lb

## 2023-03-07 DIAGNOSIS — G4734 Idiopathic sleep related nonobstructive alveolar hypoventilation: Secondary | ICD-10-CM | POA: Diagnosis not present

## 2023-03-07 DIAGNOSIS — G4733 Obstructive sleep apnea (adult) (pediatric): Secondary | ICD-10-CM | POA: Diagnosis not present

## 2023-03-07 DIAGNOSIS — K219 Gastro-esophageal reflux disease without esophagitis: Secondary | ICD-10-CM

## 2023-03-07 DIAGNOSIS — G4719 Other hypersomnia: Secondary | ICD-10-CM

## 2023-03-07 DIAGNOSIS — B20 Human immunodeficiency virus [HIV] disease: Secondary | ICD-10-CM

## 2023-03-07 DIAGNOSIS — R400 Somnolence: Secondary | ICD-10-CM | POA: Diagnosis not present

## 2023-03-07 LAB — H. PYLORI BREATH TEST: H. pylori Breath Test: NOT DETECTED

## 2023-03-07 NOTE — Progress Notes (Signed)
Subjective:    Patient ID: Phillip Gill is a 47 y.o. male.  HPI    Interim history:   Phillip Gill is a 47 year old male with an underlying medical history of hepatitis B, fibromyalgia, HIV disease, hypertension, irritable bowel syndrome, migraine headaches, renal insufficiency, anxiety, arthritis, and overweight state, who presents for follow-up consultation of his obstructive sleep apnea. He missed an appointment on 08/13/22. I first met him on 04/18/2022, at which time he reported snoring and excessive daytime somnolence.  He was advised to proceed with a sleep study.  He had a home sleep test on 05/29/2022 which indicated severe obstructive sleep apnea with a total AHI of 83.9/hour and O2 nadir of 72% with significant time below or at 88% saturation of over 90 minutes, indicating nocturnal hypoxemia. There was a mild central apnea component. Moderate to loud snoring was detected.  He was advised to proceed with home AutoPap therapy.  His set up date was 06/08/2022.    He did not return for a FU appointment.   He had a ResMed air sense 10 AutoSet machine, DME company was [employer].  Today, 03/07/23: He reports that he returned the his AutoPAP machine. He would like to get back on it.  He admits that he was not using it consistently, he was struggling with his depression and was not motivated to use his AutoPap either.  He is very sleepy during the day and admits that he does not have a scheduled nighttime or daytime schedule, sleeps throughout the day.  He indicates severe sleepiness with an Epworth sleepiness score of 24 out of 24.  He admits that he drove himself here, he is strongly advised not to drive as he indicates severe sleepiness. He is at great risk of falling asleep at the wheel.  We went over his sleep test results from November 2023.  As he looks back, he reports that he was able to tolerate the mask and the pressure, he is motivated to get back on treatment. He does not currently  work.  The patient's allergies, current medications, family history, past medical history, past social history, past surgical history and problem list were reviewed and updated as appropriate.   Previously:   04/18/22: (He) reports snoring and excessive daytime somnolence.  I reviewed your office note from 01/22/2022.  His Epworth sleepiness score is 21 out of 24, fatigue severity score is 63 out of 63.  He admits to falling asleep easily, he admits to falling asleep at the wheel, he has never had a car accident but has dozed off.  He has been strongly.  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  He goes to bed generally between 8 and 10 PM and rise time is generally between 5 and 7, depending on what job he has to go to.  He currently stays with family members.  He has been told that he stops breathing while asleep by ex partners.  He has nocturia frequently, about 4-5 times per average night, has had occasional morning headaches, these are dull, achy, not severe enough to warrant medication, even over-the-counter.  He drinks caffeine in the form of soda, 2-3 bottles per day, occasional coffee.  He quit smoking in February 2023 and vapes nicotine, planning to quit altogether.  He drinks alcohol occasionally.  He does have a TV in his bedroom and has to  stay on night for him to be able to sleep, he states.  He has a cousin with sleep apnea and is familiar with CPAP therapy due to his patients.    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 Luciana Axe- sees him every 4 months   Hypertension    IBS (irritable bowel syndrome)     Migraine    "none in years" (02/16/2015   Overweight 07/20/2015   Renal insufficiency 12/29/2013    His Past Surgical History Is Significant For: Past Surgical History:  Procedure Laterality Date   CO2 LASER APPLICATION N/A 02/11/2013   Procedure: CO2 LASER APPLICATION;  Surgeon: Romie Levee, MD;  Location: [facility];  Service: General;  Laterality: N/A;   FOOT SURGERY     HIGH RESOLUTION ANOSCOPY N/A 02/11/2013   Procedure: HIGH RESOLUTION ANOSCOPY WITH BIOPSY, LASER ABLATION;  Surgeon: Romie Levee, MD;  Location: [facility];  Service: General;  Laterality: N/A;   WISDOM TOOTH EXTRACTION      His Family History Is Significant For: Family History  Problem Relation Age of Onset   Hypertension Mother    Diabetes Mother    Stroke Mother        cerbral aneurysm   Hypertension Brother    Sleep apnea Cousin    Mental illness Neg Hx     His Social History Is Significant For: Social History   Socioeconomic History   Marital status: Single    Spouse name: Not on file   Number of children: Not on file   Years of education: Not on file   Highest education level: Not on file  Occupational History   Not on file  Tobacco Use   Smoking status: Former    Current packs/day: 0.00    Average packs/day: 1 pack/day for 18.0 years (18.0 ttl pk-yrs)    Types: Cigarettes    Start date: 07/03/2003    Quit date: 07/02/2021    Years since quitting: 1.6    Passive exposure: Never   Smokeless tobacco: Never   Tobacco comments:    Pt vapes everyday  Vaping Use   Vaping status: Every Day   Substances: Nicotine  Substance and Sexual Activity   Alcohol use: Yes    Comment: occ   Drug use: No   Sexual activity: Not on file    Comment: accepted condoms  Other Topics Concern   Not on file  Social History Narrative   Grew up in [place], now living in [place],-  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    Finished HS.   Doing online classes with [employer]- studying accounting   Previously 2 years of classes at [employer].    Has Partner- [name]- together for 1 year.    Has 2 girls- (born in 2000).          Social Determinants of Health   Financial Resource Strain: High Risk (02/05/2023)   Overall Financial Resource Strain (CARDIA)    Difficulty of Paying Living Expenses: Very hard  Food Insecurity: Food Insecurity Present (02/05/2023)   Hunger Vital Sign  Worried About Programme researcher, broadcasting/film/video in the Last Year: Sometimes true    [employer] of Food in the Last Year: Sometimes true  Transportation Needs: Unmet Transportation Needs (02/05/2023)   PRAPARE - Administrator, Civil Service (Medical): Yes    Lack of Transportation (Non-Medical): Yes  Physical Activity: Sufficiently Active (07/16/2018)   Received from [facility], [facility]   Exercise Vital Sign    Days of Exercise per Week: 4 days    Minutes of Exercise per Session: 150+ min  Stress: Stress Concern Present (03/01/2023)   [employer] of Occupational Health - Occupational Stress Questionnaire    Feeling of Stress : To some extent  Social Connections: Unknown (05/23/2022)   [facility] from [facility], [facility]   Social Network    Social Network: Not on file    His Allergies Are:  Allergies  Allergen Reactions   Bactrim [Sulfamethoxazole W/Trimethoprim (Co-Trimoxazole)] Hives and Shortness Of Breath   Sulfa Antibiotics Hives and Shortness Of Breath   Cymbalta [Duloxetine Hcl] Diarrhea   Duloxetine Diarrhea   Other Hives and Swelling    [employer] toothpaste    Zoloft [Sertraline Hcl] Diarrhea   Clindamycin/Lincomycin Rash   Neurontin [Gabapentin] Rash  :   His Current Medications Are:  Outpatient Encounter Medications as of 03/07/2023  Medication Sig   amLODipine (NORVASC) 10 MG tablet TAKE 1 TABLET(10 MG) BY MOUTH DAILY   ARIPiprazole (ABILIFY) 5 MG tablet Take 1 tablet (5 mg total) by mouth  daily.   bictegravir-emtricitabine-tenofovir AF (BIKTARVY) 50-200-25 MG TABS tablet TAKE 1 TABLET BY MOUTH EVERY DAY   diclofenac Sodium (VOLTAREN) 1 % GEL Apply 4 g topically 4 (four) times daily. To affected joint.   escitalopram (LEXAPRO) 20 MG tablet Take 1 tablet (20 mg total) by mouth daily.   esomeprazole (NEXIUM) 40 MG capsule Take 1 capsule (40 mg total) by mouth every morning.   finasteride (PROSCAR) 5 MG tablet Take 1 tablet (5 mg total) by mouth daily.   polyethylene glycol powder (GLYCOLAX/MIRALAX) 17 GM/SCOOP powder Take 17 g by mouth daily.   sildenafil (VIAGRA) 100 MG tablet Take 0.5-1 tablets (50-100 mg total) by mouth daily as needed for erectile dysfunction. Do not exceed 1 tablet per day.   [DISCONTINUED] AMBULATORY NON FORMULARY MEDICATION TENS unit. Back pain Disp TENS unit and accessories. (Patient not taking: Reported on 03/05/2023)   No facility-administered encounter medications on file as of 03/07/2023.  :  Review of Systems:  Out of a complete 14 point review of systems, all are reviewed and negative with the exception of these symptoms as listed below:  Review of Systems  Neurological:        Rm 5 alone Pt is well, reports he had to turn his CPAP back in. He is interested in getting back on it. He is still having excessive daytime fatigue, snoring and witnessed apneas.     Objective:  Neurological Exam  Physical Exam Physical Examination:   Vitals:   03/07/23 1439  BP: 118/79  Pulse: 74    General Examination: The patient is a very pleasant 47 y.o. male in no acute distress. He appears well-developed and well-nourished and well groomed.   HEENT: Normocephalic, atraumatic, pupils are equal, round and reactive to light, extraocular tracking is good without limitation to gaze excursion or nystagmus noted. Hearing is grossly intact. Face is symmetric with normal facial animation. Speech is clear with no dysarthria noted. There is no  hypophonia. There is no  lip, neck/head, jaw or voice tremor. Neck is supple with full range of passive and active motion. There are no carotid bruits on auscultation. Oropharynx exam reveals: mild to moderate mouth dryness, adequate dental hygiene and moderate airway crowding. Mallampati class IV.  Tongue protrudes centrally and palate elevates symmetrically.   Chest: Clear to auscultation without wheezing, rhonchi or crackles noted.   Heart: S1+S2+0, regular and normal without murmurs, rubs or gallops noted.    Abdomen: Soft, non-tender and non-distended.   Extremities: There is no pitting edema in the distal lower extremities bilaterally.    Skin: Warm and dry without trophic changes noted.    Musculoskeletal: exam reveals no obvious joint deformities.    Neurologically:  Mental status: The patient is awake, alert and oriented in all 4 spheres. His immediate and remote memory, attention, language skills and fund of knowledge are appropriate. There is no evidence of aphasia, agnosia, apraxia or anomia. Speech is clear with normal prosody and enunciation. Thought process is linear. Mood is normal and affect is normal.  Cranial nerves II - XII are as described above under HEENT exam.  Motor exam: Normal bulk, strength and tone is noted. There is no obvious action or resting tremor.  Fine motor skills and coordination: grossly intact.  Cerebellar testing: No dysmetria or intention tremor. There is no truncal or gait ataxia.  Sensory exam: intact to light touch in the upper and lower extremities.  Gait, station and balance: He stands easily. No veering to one side is noted. No leaning to one side is noted. Posture is age-appropriate and stance is narrow based. Gait shows normal stride length and normal pace. No problems turning are noted.    Assessment and Plan:  In summary, Phillip Gill is a 47 year old male with an underlying medical history of hepatitis B, fibromyalgia, HIV disease, hypertension, irritable  bowel syndrome, migraine headaches, renal insufficiency, anxiety, arthritis, and overweight state, who presents for follow-up consultation of his severe obstructive sleep apnea. He had a home sleep test on 05/29/2022 which indicated severe obstructive sleep apnea with a total AHI of 83.9/hour and O2 nadir of 72% with significant time below or at 88% saturation of over 90 minutes, indicating nocturnal hypoxemia. There was a mild central apnea component as well. Moderate to loud snoring was detected.  He was advised to proceed with home AutoPap therapy at the time and while he received an AutoPap machine in early December 2023, he returned the machine, due to noncompliance.  He is motivated to get restarted on treatment.  We talked about the importance of treating severe obstructive sleep apnea not just for symptom control but also to reduce cardiovascular risks down the road.  He is advised to try to work on a set sleep and rise time schedule.  I will write for a new AutoPap machine and we will send the order to his DME provider.  We will plan a follow-up within 2 to 3 months of set up date.  I explained to him the importance of compliance with treatment.  He is strongly advised not to drive when feeling sleepy, he indicates very severe sleepiness at this time.  I answered all his questions today and he was in agreement with our plan.  I spent 40 minutes in total face-to-face time and in reviewing records during pre-charting, more than 50% of which was spent in counseling and coordination of care, reviewing test results, reviewing medications and treatment regimen and/or in  discussing or reviewing the diagnosis of OSA, severe sleepiness, the prognosis and treatment options. Pertinent laboratory and imaging test results that were available during this visit with the patient were reviewed by me and considered in my medical decision making (see chart for details).

## 2023-03-07 NOTE — Patient Instructions (Signed)
As discussed, I will write for another AutoPap machine so we can treat your severe sleep apnea.  Please try to be consistent with your AutoPap usage, I recommend using it all night, every night.  You indicate severe sleepiness, please do not drive when feeling sleepy as you are at high risk of falling asleep at the wheel.

## 2023-03-08 ENCOUNTER — Other Ambulatory Visit (HOSPITAL_COMMUNITY): Payer: Self-pay

## 2023-03-08 ENCOUNTER — Other Ambulatory Visit: Payer: Self-pay

## 2023-03-08 NOTE — Progress Notes (Signed)
   03/08/2023 Name: Phillip Gill MRN: [id number] DOB: 01/13/1976  Chief Complaint  Patient presents with   Medication Assistance   Phillip Gill is a 47 y.o. year old male who presented for a telephone visit.   They were referred to the pharmacist by their PCP for assistance in managing medication access.   Subjective:  Care Team: Primary Care Provider: Garnette Gunner, MD ; Next Scheduled Visit: 10/29  Medication Access/Adherence  Current Pharmacy:  [id number] - [place], [place] - [address] AT [facility] [address] [name] [address] Phone: [phone] Fax: [phone]  -Patient reports affordability concerns with their medications: Yes - Medicaid covers patient's prescriptions, but he cannot always afford the $4 copays -Patient reports access/transportation concerns to their pharmacy: Yes-would like to get set up with [facility] for home delivery  -Patient reports adherence concerns with their medications:  Yes  Patient is sometimes not able to pick up medications due to inability to afford copays  Objective: Medications Reviewed Today     Reviewed by Lenna Gilford, [facility] (Pharmacist) on 03/08/23 at 1243  Med List Status: <None>   Medication Order Taking? Sig Documenting Provider Last Dose Status Informant  amLODipine (NORVASC) 10 MG tablet [id number] Yes TAKE 1 TABLET(10 MG) BY MOUTH DAILY Veryl Speak, FNP Taking Active   ARIPiprazole (ABILIFY) 5 MG tablet [id number] Yes Take 1 tablet (5 mg total) by mouth daily. Garnette Gunner, MD Taking Active   bictegravir-emtricitabine-tenofovir AF (BIKTARVY) 50-200-25 MG TABS tablet [id number] Yes TAKE 1 TABLET BY MOUTH EVERY DAY Veryl Speak, FNP Taking Active   diclofenac Sodium (VOLTAREN) 1 % GEL [id number] No Apply 4 g topically 4 (four) times daily. To affected joint. Rodolph Bong, MD Not Taking Active   escitalopram (LEXAPRO) 20 MG tablet [id number] Yes  Take 1 tablet (20 mg total) by mouth daily. Veryl Speak, FNP Taking Active   esomeprazole (NEXIUM) 40 MG capsule [id number] Yes Take 1 capsule (40 mg total) by mouth every morning. Garnette Gunner, MD Taking Active   finasteride (PROSCAR) 5 MG tablet [id number] Yes Take 1 tablet (5 mg total) by mouth daily. Veryl Speak, FNP Taking Active   polyethylene glycol powder (GLYCOLAX/MIRALAX) 17 GM/SCOOP powder [id number] Yes Take 17 g by mouth daily. Garnette Gunner, MD Taking Active   sildenafil (VIAGRA) 100 MG tablet [id number] Yes Take 0.5-1 tablets (50-100 mg total) by mouth daily as needed for erectile dysfunction. Do not exceed 1 tablet per day. Veryl Speak, FNP Taking Active            Assessment/Plan:   Medication Access/Adherence -Informed patient that he can request that Medicaid copays be waived on occasion at retail pharmacies; he may also ask [facility] for a charge account to place these copays on and make payments on a regular basis -Coordinated with PharmD associated with [facility] to transfer prescriptions to pharmacy and get patient set up for home delivery -Provided updated mailing address just for medication delivery of [address] [place], [place] [address]  Follow Up Plan: None scheduled but can as recommended per PCP  Lenna Gilford, PharmD, DPLA

## 2023-03-08 NOTE — Addendum Note (Signed)
Addended by: Sabino Niemann A on: 03/08/2023 01:30 PM   Modules accepted: Orders

## 2023-03-09 ENCOUNTER — Other Ambulatory Visit (HOSPITAL_COMMUNITY): Payer: Self-pay

## 2023-03-11 ENCOUNTER — Other Ambulatory Visit: Payer: Self-pay

## 2023-03-11 ENCOUNTER — Other Ambulatory Visit (HOSPITAL_COMMUNITY): Payer: Self-pay

## 2023-03-11 MED ORDER — ELVITEG-COBIC-EMTRICIT-TENOFAF 150-150-200-10 MG PO TABS
1.0000 | ORAL_TABLET | Freq: Every day | ORAL | 0 refills | Status: DC
  Filled 2023-03-28: qty 30, 30d supply, fill #0

## 2023-03-11 MED ORDER — FLUOXETINE HCL 20 MG PO CAPS
20.0000 mg | ORAL_CAPSULE | Freq: Every day | ORAL | 0 refills | Status: DC
  Filled 2023-03-28: qty 90, 90d supply, fill #0

## 2023-03-11 MED ORDER — BIKTARVY 50-200-25 MG PO TABS
ORAL_TABLET | ORAL | 1 refills | Status: AC
Start: 2023-03-11 — End: ?
  Filled 2023-03-11: qty 90, fill #0
  Filled 2023-03-22: qty 30, 30d supply, fill #0

## 2023-03-11 MED ORDER — ARIPIPRAZOLE 2 MG PO TABS: 2.0000 mg | ORAL_TABLET | Freq: Every day | ORAL | 0 refills | Status: DC

## 2023-03-11 MED ORDER — AMOXICILLIN-POT CLAVULANATE 875-125 MG PO TABS: 1.0000 | ORAL_TABLET | Freq: Two times a day (BID) | ORAL | 0 refills | Status: DC

## 2023-03-11 MED ORDER — ESCITALOPRAM OXALATE 10 MG PO TABS: 10.0000 mg | ORAL_TABLET | Freq: Every day | ORAL | 1 refills | Status: DC

## 2023-03-11 MED ORDER — BICTEGRAVIR-EMTRICITAB-TENOFOV 50-200-25 MG PO TABS
1.0000 | ORAL_TABLET | Freq: Every day | ORAL | 5 refills | Status: DC
  Filled 2023-03-20 – 2023-03-28 (×2): qty 30, 30d supply, fill #0

## 2023-03-12 ENCOUNTER — Other Ambulatory Visit: Payer: Self-pay

## 2023-03-12 ENCOUNTER — Other Ambulatory Visit (HOSPITAL_COMMUNITY): Payer: Self-pay

## 2023-03-13 NOTE — Progress Notes (Unsigned)
Rubin Payor, PhD, LAT, ATC acting as a scribe for Clementeen Graham, MD.  Phillip Gill is a 47 y.o. male who presents to [facility] today for cont'd neck pain w/ paresthesias both UE and low back pain. Pt was last seen by Dr. Denyse Amass on 01/16/23 and was advised to cont PT, completing 9 visits.   Today, pt reports back and neck are still quite painful. He c/o pain radiating from his R hip to anterior lower leg. He locates pain to the midline of his mid and low back.   Numbness/tingling: yes- L leg and bilat arms  Dx imaging: 01/16/23 L-spine XR 12/19/22 C-spine XR   Pertinent review of systems: No fevers or chills  Relevant historical information: Hypertension Significant social determinants of health.  Phillip Gill has difficulty with housing.  He lives part-time in different locations does not have a permanent place to live.   Exam:  BP 136/88   Pulse 76   Ht 6' (1.829 m)   Wt 225 lb (102.1 kg)   SpO2 97%   BMI 30.52 kg/m  General: Well Developed, well nourished, and in no acute distress.   MSK: C-spine: Normal appearing Nontender palpation midline. Decree cervical motion.  Upper extremity strength mildly decreased to abduction and elbow extension bilaterally. Reflexes are intact.  L-spine normal appearing. Nontender palpation spinal midline. Decreased lumbar motion. Lower extremity strength is intact. Positive slump test    Lab and Radiology Results  EXAM: LUMBAR SPINE - 2-3 VIEW   COMPARISON:  December 30, 2019.   FINDINGS: There are five non-rib bearing lumbar-type vertebral bodies. There is normal alignment. There is no evidence for acute fracture or subluxation. Intervertebral disc spaces are preserved without significant degenerative changes. Visualized abdomen is unremarkable.   IMPRESSION: Negative.     Electronically Signed   By: Meda Klinefelter M.D.   On: 01/23/2023 19:58   EXAM: CERVICAL SPINE - 3 VIEW    COMPARISON:  None Available.   FINDINGS: No fracture, dislocation or subluxation. No spondylolisthesis. No osteolytic or osteoblastic changes. Prevertebral and cervical cranial soft tissues are unremarkable.   Calcification of the anterior annuli identified at C2-3, C4-5 and C5-6 consistent with early degenerative changes.   IMPRESSION: Early/mild degenerative changes. No acute osseous abnormalities.     Electronically Signed   By: Layla Maw M.D.   On: 12/23/2022 22:49   I, Clementeen Graham, personally (independently) visualized and performed the interpretation of the images attached in this note.    Assessment and Plan: 47 y.o. male with cervical and lumbar radiculopathy.  These are both chronic problems ongoing for over 2 months.  He has had a great trial of physical therapy since his last visit about 2 months ago.  Unfortunately PT was not effective.  Plan for MRI cervical spine and lumbar spine to further evaluate source of pain and for next treatment planning.  Suspect bilateral L5 lumbar radiculopathy and C6 or C7 cervical radiculopathy.  He is having a fair amount of pain today.  Short course of prednisone prescribed.  Additionally proceed with limited inflammatory workup to evaluate for polymyalgia rheumatica or other inflammatory conditions with sedimentation rate and CK.   PDMP not reviewed this encounter. Orders Placed This Encounter  Procedures   MR CERVICAL SPINE WO CONTRAST    Standing Status:   Future    Standing Expiration Date:   04/13/2023    Order Specific Question:   What is the patient's sedation requirement?  Answer:   No Sedation    Order Specific Question:   Does the patient have a pacemaker or implanted devices?    Answer:   No    Order Specific Question:   Preferred imaging location?    Answer:   [facility] (table limit-350lbs)   MR LUMBAR SPINE WO CONTRAST    Standing Status:   Future    Standing Expiration Date:   04/13/2023     Order Specific Question:   What is the patient's sedation requirement?    Answer:   No Sedation    Order Specific Question:   Does the patient have a pacemaker or implanted devices?    Answer:   No    Order Specific Question:   Preferred imaging location?    Answer:   [facility] (table limit-350lbs)   Sedimentation rate    Standing Status:   Future    Number of Occurrences:   1    Standing Expiration Date:   03/13/2024   CK (Creatine Kinase)   Meds ordered this encounter  Medications   predniSONE (DELTASONE) 50 MG tablet    Sig: Take 1 tablet (50 mg total) by mouth daily for 5 days.    Dispense:  5 tablet    Refill:  0     Discussed warning signs or symptoms. Please see discharge instructions. Patient expresses understanding.   The above documentation has been reviewed and is accurate and complete Clementeen Graham, M.D.

## 2023-03-14 ENCOUNTER — Other Ambulatory Visit: Payer: Self-pay

## 2023-03-14 ENCOUNTER — Ambulatory Visit (INDEPENDENT_AMBULATORY_CARE_PROVIDER_SITE_OTHER): Payer: Medicaid Other | Admitting: Family Medicine

## 2023-03-14 ENCOUNTER — Encounter: Payer: Self-pay | Admitting: Pharmacist

## 2023-03-14 VITALS — BP 136/88 | HR 76 | Ht 72.0 in | Wt 225.0 lb

## 2023-03-14 DIAGNOSIS — M5442 Lumbago with sciatica, left side: Secondary | ICD-10-CM

## 2023-03-14 DIAGNOSIS — G8929 Other chronic pain: Secondary | ICD-10-CM

## 2023-03-14 DIAGNOSIS — M5412 Radiculopathy, cervical region: Secondary | ICD-10-CM

## 2023-03-14 DIAGNOSIS — M791 Myalgia, unspecified site: Secondary | ICD-10-CM | POA: Diagnosis not present

## 2023-03-14 LAB — SEDIMENTATION RATE: Sed Rate: 39 mm/h — ABNORMAL HIGH (ref 0–15)

## 2023-03-14 LAB — CK: Total CK: 170 U/L (ref 7–232)

## 2023-03-14 MED ORDER — PREDNISONE 50 MG PO TABS
50.0000 mg | ORAL_TABLET | Freq: Every day | ORAL | 0 refills | Status: AC
Start: 2023-03-14 — End: 2023-03-20
  Filled 2023-03-14: qty 5, 5d supply, fill #0

## 2023-03-14 NOTE — Patient Instructions (Addendum)
Thank you for coming in today.   You should hear from MRI scheduling within 1 week. If you do not hear please let me know.    Please get labs today before you leave

## 2023-03-15 ENCOUNTER — Other Ambulatory Visit (HOSPITAL_COMMUNITY): Payer: Self-pay

## 2023-03-15 ENCOUNTER — Other Ambulatory Visit: Payer: Self-pay | Admitting: Family

## 2023-03-15 ENCOUNTER — Other Ambulatory Visit: Payer: Medicaid Other

## 2023-03-15 NOTE — Patient Outreach (Signed)
Medicaid Managed Care Social Work Note  03/15/2023 Name:  Phillip Gill MRN:  [id number] DOB:  07/27/1975  Phillip Gill is an 47 y.o. year old male who is a primary patient of Phillip Gunner, MD.  The Medicaid Managed Care Coordination team was consulted for assistance with:  Community Resources   [name] was given information about Medicaid Managed Care Coordination team services today. Phillip Gill Patient agreed to services and verbal consent obtained.  Engaged with patient  for by telephone forfollow up visit in response to referral for case management and/or care coordination services.   Assessments/Interventions:  Review of past medical history, allergies, medications, health status, including review of consultants reports, laboratory and other test data, was performed as part of comprehensive evaluation and provision of chronic care management services.  SDOH: (Social Determinant of Health) assessments and interventions performed: SDOH Interventions    Flowsheet Row Patient Outreach Telephone from 03/01/2023 in [place] [facility] Patient Outreach Telephone from 02/13/2023 in [place] POPULATION HEALTH DEPARTMENT Office Visit from 02/05/2023 in [facility] [employer]  SDOH Interventions     Food Insecurity Interventions -- -- [id number] Referral  Housing Interventions Intervention Not Indicated  [facility] BSW involved] -- [id number] Referral, AMB Referral  Depression Interventions/Treatment  -- Referral to Psychiatry, Counseling --  Financial Strain Interventions -- -- [id number] Referral, Financial Counselor  Stress Interventions Offered [employer], Provide Counseling Offered [employer], Provide Counseling  [Homeless] --     BSW completed a telephone outreach with patient, he states he did receive the resources BSW sent to him. He informed patient that he ran out of his espitalopram on  03/14/23 and that his medication was supposed to be delivered. BSW informed the pharmacy did try to get in touch with him about a payment, and provided patient with telephone number to contact pharmacy. No other resources are needed at this time.  Advanced Directives Status:  Not addressed in this encounter.  Care Plan                 Allergies  Allergen Reactions   Bactrim [Sulfamethoxazole W/Trimethoprim (Co-Trimoxazole)] Hives and Shortness Of Breath   Sulfa Antibiotics Hives and Shortness Of Breath   Cymbalta [Duloxetine Hcl] Diarrhea   Duloxetine Diarrhea   Other Hives and Swelling    [employer]gate toothpaste    Zoloft [Sertraline Hcl] Diarrhea   Clindamycin/Lincomycin Rash   Neurontin [Gabapentin] Rash    Medications Reviewed Today   Medications were not reviewed in this encounter     Patient Active Problem List   Diagnosis Date Noted   MDD (major depressive disorder), recurrent severe, without psychosis (HCC) 03/05/2023   Gastroesophageal reflux disease 03/05/2023   Passive suicidal ideations 02/05/2023   OSA (obstructive sleep apnea) 02/05/2023   MDD (major depressive disorder) 12/19/2022   Urinary frequency 12/18/2022   Left foot pain 12/18/2022   Poor social situation 12/18/2022   Snoring 01/22/2022   Encounter for physical examination related to employment 09/26/2021   Human monkeypox 04/06/2021   Cervical pain 02/07/2021   Erectile dysfunction 08/29/2020   Healthcare maintenance 04/05/2020   Foot lesion 12/24/2019   Human immunodeficiency virus (HIV) disease (HCC) 08/25/2019   Gonorrhea 02/11/2019   Hepatitis B surface antigen positive 09/05/2018   Overweight 07/20/2015   Colitis 03/08/2015   HIV (human immunodeficiency virus infection) (HCC)    Tobacco abuse    LGV (lymphogranuloma venereum)    Primary syphilis  Chronic hepatitis B (HCC) 10/03/2014   HTN (hypertension)    Gynecomastia 04/30/2014   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   Chronic pain associated with  significant psychosocial dysfunction 09/18/2012   Fibromyalgia muscle pain 02/11/2012   Depression 12/07/2011   Other constipation 07/16/2011   Anxiety 02/02/2011    Conditions to be addressed/monitored per PCP order:   community resources  There are no care plans that you recently modified to display for this patient.   Follow up:  Patient agrees to Care Plan and Follow-up.  Plan: The  Patient has been provided with contact information for the Managed Medicaid care management team and has been advised to call with any health related questions or concerns.    Abelino Derrick, MHA [facility]  Managed [facility] Social Worker [phone]

## 2023-03-15 NOTE — Patient Instructions (Signed)
Visit Information  Phillip Gill was given information about Medicaid Managed Care team care coordination services as a part of their [facility] Community Plan Medicaid benefit. Phillip Gill verbally consented to engagement with the [facility] Managed Care team.   If you are experiencing a medical emergency, please call 911 or report to your local emergency department or urgent care.   If you have a non-emergency medical problem during routine business hours, please contact your provider's office and ask to speak with a nurse.   For questions related to your [facility], please call: [phone] or visit the homepage here: [email or web address]  If you would like to schedule transportation through your [facility], please call the following number at least 2 days in advance of your appointment: [phone]   Rides for urgent appointments can also be made after hours by calling Member Services.  Call the Behavioral Health Crisis Line at [phone], at any time, 24 hours a day, 7 days a week. If you are in danger or need immediate medical attention call 911.  If you would like help to quit smoking, call 1-800-QUIT-NOW (2046121760) OR Espaol: 1-855-Djelo-Ya (1-607-371-0626) o para ms informacin haga clic aqu or Text READY to 948-546 to register via text  [name] - following are the goals we discussed in your visit today:   Goals Addressed   None      The  Patient                                              has been provided with contact information for the Managed Medicaid care management team and has been advised to call with any health related questions or concerns.   Gus Puma, Kenard Gower, MHA [facility]  Managed Medicaid Social Worker [phone]   Following is a copy of your plan of care:  There are no care plans that you recently modified to display  for this patient.

## 2023-03-18 NOTE — Progress Notes (Signed)
There is some general inflammation but no muscle inflammation.  Lets see what these MRI show.

## 2023-03-20 ENCOUNTER — Other Ambulatory Visit (HOSPITAL_COMMUNITY): Payer: Self-pay

## 2023-03-21 DIAGNOSIS — F411 Generalized anxiety disorder: Secondary | ICD-10-CM | POA: Diagnosis not present

## 2023-03-22 ENCOUNTER — Other Ambulatory Visit (HOSPITAL_COMMUNITY): Payer: Self-pay

## 2023-03-22 ENCOUNTER — Other Ambulatory Visit: Payer: Self-pay

## 2023-03-25 ENCOUNTER — Other Ambulatory Visit: Payer: Self-pay | Admitting: Pharmacist

## 2023-03-25 NOTE — Progress Notes (Signed)
Specialty Pharmacy Initial Fill Coordination Note  Phillip Gill is a 47 y.o. male contacted today regarding refills of specialty medication(s) Bictegravir-Emtricitab-Tenofov .  Patient requested Delivery  on 03/22/23  to verified address [address]   Medication will be filled on 03/22/23.   Patient is aware of copayment.

## 2023-03-25 NOTE — Progress Notes (Signed)
Specialty Pharmacy Initiation Note   Phillip Gill is a 47 y.o. male who will be followed by the specialty pharmacy service for RxSp HIV    Review of administration, indication, effectiveness, safety, potential side effects, storage/disposable, and missed dose instructions occurred today for patient's specialty medication(s): Biktarvy.    Patient did not have any additional questions or concerns.   Patient's therapy is appropriate to : Initiate    Goals      Achieve Undetectable HIV Viral Load < 20     Patient is on track. Patient will maintain adherence.      Maintain optimal adherence to therapy     Patient is on track. Patient will maintain adherence.      Minimize and address adverse drug events/drug interactions     Patient is on track. Patient will be evaluated at upcoming provider appointment to assess progress.         Proctor Carriker L. Ross Bender, PharmD, BCIDP, AAHIVP, CPP Clinical Pharmacist Practitioner Infectious Diseases Clinical Pharmacist [facility] 03/25/2023, 3:31 PM

## 2023-03-26 ENCOUNTER — Ambulatory Visit: Payer: Medicaid Other

## 2023-03-26 DIAGNOSIS — M50221 Other cervical disc displacement at C4-C5 level: Secondary | ICD-10-CM | POA: Diagnosis not present

## 2023-03-26 DIAGNOSIS — M47812 Spondylosis without myelopathy or radiculopathy, cervical region: Secondary | ICD-10-CM | POA: Diagnosis not present

## 2023-03-26 DIAGNOSIS — M48061 Spinal stenosis, lumbar region without neurogenic claudication: Secondary | ICD-10-CM | POA: Diagnosis not present

## 2023-03-26 DIAGNOSIS — M5442 Lumbago with sciatica, left side: Secondary | ICD-10-CM

## 2023-03-26 DIAGNOSIS — M5412 Radiculopathy, cervical region: Secondary | ICD-10-CM

## 2023-03-26 DIAGNOSIS — M4802 Spinal stenosis, cervical region: Secondary | ICD-10-CM | POA: Diagnosis not present

## 2023-03-26 DIAGNOSIS — M5021 Other cervical disc displacement,  high cervical region: Secondary | ICD-10-CM | POA: Diagnosis not present

## 2023-03-26 DIAGNOSIS — M5136 Other intervertebral disc degeneration, lumbar region: Secondary | ICD-10-CM | POA: Diagnosis not present

## 2023-03-26 DIAGNOSIS — M4807 Spinal stenosis, lumbosacral region: Secondary | ICD-10-CM | POA: Diagnosis not present

## 2023-03-26 DIAGNOSIS — G8929 Other chronic pain: Secondary | ICD-10-CM

## 2023-03-26 DIAGNOSIS — M47816 Spondylosis without myelopathy or radiculopathy, lumbar region: Secondary | ICD-10-CM | POA: Diagnosis not present

## 2023-03-28 ENCOUNTER — Other Ambulatory Visit: Payer: Self-pay

## 2023-03-28 ENCOUNTER — Other Ambulatory Visit: Payer: Self-pay | Admitting: Family Medicine

## 2023-03-28 ENCOUNTER — Other Ambulatory Visit: Payer: Self-pay | Admitting: Family

## 2023-03-28 DIAGNOSIS — B181 Chronic viral hepatitis B without delta-agent: Secondary | ICD-10-CM

## 2023-03-28 DIAGNOSIS — K219 Gastro-esophageal reflux disease without esophagitis: Secondary | ICD-10-CM

## 2023-03-28 MED ORDER — ESOMEPRAZOLE MAGNESIUM 40 MG PO CPDR
40.0000 mg | DELAYED_RELEASE_CAPSULE | Freq: Every morning | ORAL | 0 refills | Status: DC
Start: 2023-03-28 — End: 2023-07-27
  Filled 2023-03-28 – 2023-04-28 (×4): qty 90, 90d supply, fill #0

## 2023-03-29 ENCOUNTER — Other Ambulatory Visit: Payer: Self-pay

## 2023-03-29 ENCOUNTER — Other Ambulatory Visit: Payer: Self-pay | Admitting: Family Medicine

## 2023-03-29 ENCOUNTER — Other Ambulatory Visit (HOSPITAL_COMMUNITY): Payer: Self-pay

## 2023-03-29 ENCOUNTER — Encounter (HOSPITAL_COMMUNITY): Payer: Self-pay

## 2023-03-29 MED ORDER — ESCITALOPRAM OXALATE 20 MG PO TABS
20.0000 mg | ORAL_TABLET | Freq: Every day | ORAL | 5 refills | Status: DC
  Filled 2023-03-29: qty 30, 30d supply, fill #0
  Filled 2023-04-13 – 2023-04-28 (×2): qty 30, 30d supply, fill #1
  Filled 2023-05-27: qty 30, 30d supply, fill #2
  Filled 2023-08-23: qty 30, 30d supply, fill #3

## 2023-03-29 MED ORDER — SILDENAFIL CITRATE 100 MG PO TABS
50.0000 mg | ORAL_TABLET | Freq: Every day | ORAL | 1 refills | Status: DC | PRN
  Filled 2023-03-29: qty 30, 30d supply, fill #0

## 2023-03-29 MED ORDER — SILDENAFIL CITRATE 100 MG PO TABS: 50.0000 mg | ORAL_TABLET | Freq: Every day | ORAL | 1 refills | Status: DC | PRN

## 2023-03-29 NOTE — Addendum Note (Signed)
Addended by: Trudee Kuster on: 03/29/2023 09:14 AM   Modules accepted: Orders

## 2023-04-01 ENCOUNTER — Other Ambulatory Visit (HOSPITAL_COMMUNITY)
Admission: RE | Admit: 2023-04-01 | Discharge: 2023-04-01 | Disposition: A | Discharge status: Home / Self Care | Payer: Medicaid Other | Source: Ambulatory Visit | Attending: Family | Admitting: Family

## 2023-04-01 ENCOUNTER — Other Ambulatory Visit: Payer: Medicaid Other | Admitting: Licensed Clinical Social Worker

## 2023-04-01 ENCOUNTER — Other Ambulatory Visit: Payer: Self-pay

## 2023-04-01 ENCOUNTER — Telehealth: Payer: Self-pay | Admitting: Family Medicine

## 2023-04-01 ENCOUNTER — Encounter: Payer: Self-pay | Admitting: Neurology

## 2023-04-01 ENCOUNTER — Telehealth: Payer: Self-pay | Admitting: *Deleted

## 2023-04-01 ENCOUNTER — Encounter: Payer: Self-pay | Admitting: Family

## 2023-04-01 ENCOUNTER — Ambulatory Visit (INDEPENDENT_AMBULATORY_CARE_PROVIDER_SITE_OTHER): Payer: Medicaid Other | Admitting: Family

## 2023-04-01 VITALS — BP 130/90 | HR 82 | Temp 97.5°F | Resp 16 | Wt 236.6 lb

## 2023-04-01 DIAGNOSIS — R350 Frequency of micturition: Secondary | ICD-10-CM | POA: Insufficient documentation

## 2023-04-01 DIAGNOSIS — B181 Chronic viral hepatitis B without delta-agent: Secondary | ICD-10-CM | POA: Diagnosis not present

## 2023-04-01 DIAGNOSIS — G4733 Obstructive sleep apnea (adult) (pediatric): Secondary | ICD-10-CM

## 2023-04-01 DIAGNOSIS — R221 Localized swelling, mass and lump, neck: Secondary | ICD-10-CM | POA: Insufficient documentation

## 2023-04-01 DIAGNOSIS — M5412 Radiculopathy, cervical region: Secondary | ICD-10-CM

## 2023-04-01 DIAGNOSIS — B20 Human immunodeficiency virus [HIV] disease: Secondary | ICD-10-CM

## 2023-04-01 DIAGNOSIS — Z0000 Encounter for general adult medical examination without abnormal findings: Secondary | ICD-10-CM

## 2023-04-01 MED ORDER — BIKTARVY 50-200-25 MG PO TABS
ORAL_TABLET | ORAL | 5 refills | Status: DC
  Filled 2023-04-01: qty 30, fill #0
  Filled 2023-04-11: qty 30, 30d supply, fill #0
  Filled 2023-05-09: qty 30, 30d supply, fill #1
  Filled 2023-06-05: qty 30, 30d supply, fill #2
  Filled 2023-07-07 – 2023-07-08 (×2): qty 30, 30d supply, fill #3
  Filled 2023-07-29: qty 30, 30d supply, fill #4

## 2023-04-01 NOTE — Assessment & Plan Note (Signed)
Phillip Gill has worsening urinary frequency and urgency despite good adherence and tolerance to finasteride.  Unlikely infection although will check UA and for STD.  Check PSA.  Possibly related to poorly controlled sleep apnea.  May require referral to urology if symptoms worsen or do not improve.

## 2023-04-01 NOTE — Assessment & Plan Note (Signed)
Phillip Gill continues to have well-controlled virus with no episodes of flare or complications.  Check lab work.  Continue current dose of Biktarvy.

## 2023-04-01 NOTE — Assessment & Plan Note (Addendum)
Phillip Gill continues to have well controlled virus with good adherence and tolerance to [employer].  Reviewed lab work and discussed plan of care, U equals U, and family planning. Check lab work. Continue current dose of Biktarvy. Plan for follow up in  4 months or sooner if needed with lab work on the same day.

## 2023-04-01 NOTE — Telephone Encounter (Signed)
Received fax from [facility] that pt has decline to move forward with set up at this time due to patients out of pocket cost.  Pt can give call back when decides to move forward.

## 2023-04-01 NOTE — Patient Instructions (Signed)
Phillip Gill ,   The [facility] Managed Care Team is available to provide assistance to you with your healthcare needs at no cost and as a benefit of your [facility] Health plan. I'm sorry I was unable to reach you today for our scheduled appointment. Our care guide will call you to reschedule our telephone appointment. Please call me at the number below. I am available to be of assistance to you regarding your healthcare needs. .   Thank you,   Dickie La, BSW, MSW, LCSW Managed Medicaid LCSW [facility]  [address] [email or web address] Phone: [phone]

## 2023-04-01 NOTE — Telephone Encounter (Signed)
Epidural steroid injection order

## 2023-04-01 NOTE — Assessment & Plan Note (Signed)
Discussed importance of safe sexual practice and condom use. Condoms and STD testing offered.  Declines vaccinations.  

## 2023-04-01 NOTE — Patient Instructions (Addendum)
Nice to see you.  We will check your lab work today.  Continue to take your medication daily as prescribed.  Refills have been sent to the pharmacy.  Plan for follow up in 4 months or sooner if needed with lab work on the same day.  Have a great day and stay safe!  

## 2023-04-01 NOTE — Assessment & Plan Note (Signed)
Soft tissue lesion located on the left side of the cervical spine and the upper trapezius is soft and mildly tender.  Encouraged to follow-up with sports medicine or primary care for additional imaging.  Nothing seen on recent MRI.

## 2023-04-01 NOTE — Patient Outreach (Signed)
  Medicaid Managed Care   Unsuccessful Attempt Note   04/01/2023 Name: Phillip Gill MRN: [id number] DOB: 09-16-1975  Referred by: Garnette Gunner, MD Reason for referral : No chief complaint on file.   An unsuccessful telephone outreach was attempted today. The patient was referred to the case management team for assistance with care management and care coordination.    Follow Up Plan: The Managed Medicaid care management team will reach out to the patient again over the next 30 days.   Dickie La, BSW, MSW, [employer] Managed Medicaid LCSW [facility]  [facility] [email or web address] Phone: [phone]

## 2023-04-01 NOTE — Progress Notes (Signed)
Cervical spine MRI shows areas were nerves could be pinched causing pain to go down your arms and neck pain.  Have ordered an injection that should help.  The lumbar spine or low back MRI is still pending. You should hear from [facility] imaging soon about scheduling the injection.  Phone number is [phone]

## 2023-04-01 NOTE — Progress Notes (Signed)
Brief Narrative   Patient ID: Phillip Gill, male    DOB: Nov 05, 1975, 47 y.o.   MRN: [id number]  Phillip Gill is a 47 y/o AA male diagnosed with HIV disease in July 2012 with risk factor of MSM. Initial viral load of 99,000 and CD4 of 510 entering care at Stage 1. [id number] negative. Initial genotype with no significant resistance. Previous ART experience with Prezista, Truvada, and Biktarvy.    Subjective:    Chief Complaint  Patient presents with   Follow-up    B20 - c/o frequent urination, worse x 1 month.     HPI:  Phillip Gill is a 47 y.o. male with HIV disease and Hepatitis B last seen on 01/17/2023 with well-controlled virus and good adherence and tolerance to Biktarvy.  Hepatitis B also undetectable with normal liver function testing.  Kidney function electrolytes within normal ranges.  Here today for routine follow-up.  Phillip Gill has been doing okay since his last office visit and has increased urinary frequency and urgency for the last month despite taking the finasteride. Going to the bathroom primarily at night every 1.5 - 2 hours. No other urinary symptoms. Continues to take Biktarvy with no adverse side effects. Also concern about a lump located on the left side of his neck that was first noted a few days ago. There is some mild tenderness. Had a recent MRI of the cervical and lumbar spines via Sports Medicine. Condoms and STD testing offered. Declines vaccinations.   Denies fevers, chills, night sweats, headaches, changes in vision, neck pain/stiffness, nausea, diarrhea, vomiting, lesions or rashes.  Lab Results  Component Value Date   CD4TCELL 32 12/17/2022   CD4TABS 761 10/23/2022   Lab Results  Component Value Date   HIV1RNAQUANT 82 (H) 12/17/2022     Allergies  Allergen Reactions   Bactrim [Sulfamethoxazole W/Trimethoprim (Co-Trimoxazole)] Hives and Shortness Of Breath   Sulfa Antibiotics Hives and Shortness Of Breath   Cymbalta [Duloxetine Hcl] Diarrhea    Duloxetine Diarrhea   Other Hives and Swelling    Colgate toothpaste    Zoloft [Sertraline Hcl] Diarrhea   Clindamycin/Lincomycin Rash   Neurontin [Gabapentin] Rash      Outpatient Medications Prior to Visit  Medication Sig Dispense Refill   amLODipine (NORVASC) 10 MG tablet Take 1 tablet (10 mg total) by mouth daily. 90 tablet 1   ARIPiprazole (ABILIFY) 5 MG tablet Take 1 tablet (5 mg total) by mouth daily. 90 tablet 0   diclofenac Sodium (VOLTAREN) 1 % GEL Apply 4 grams topically 4 (four) times daily to affected joint. 100 g 11   escitalopram (LEXAPRO) 20 MG tablet Take 1 tablet (20 mg total) by mouth daily. 30 tablet 5   esomeprazole (NEXIUM) 40 MG capsule Take 1 capsule (40 mg total) by mouth every morning. 90 capsule 0   finasteride (PROSCAR) 5 MG tablet Take 1 tablet (5 mg total) by mouth daily. 30 tablet 1   FLUoxetine (PROZAC) 20 MG capsule Take 1 capsule (20 mg total) by mouth daily. 90 capsule 0   polyethylene glycol powder (GLYCOLAX/MIRALAX) 17 GM/SCOOP powder Dissolve 17 grams in liquid and drink by mouth every day. 476 g 0   sildenafil (VIAGRA) 100 MG tablet Take 0.5-1 tablets (50-100 mg total) by mouth daily as needed for erectile dysfunction. Do not exceed 1 tablet per day. 30 tablet 1   bictegravir-emtricitabine-tenofovir AF (BIKTARVY) 50-200-25 MG TABS tablet TAKE 1 TABLET BY MOUTH EVERY DAY 90 tablet 1   ARIPiprazole (  ABILIFY) 2 MG tablet Take 1 tablet (2 mg total) by mouth daily. 90 tablet 0   No facility-administered medications prior to visit.     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 Luciana Gill- sees him every 4 months   Hypertension    IBS (irritable bowel syndrome)    Migraine    "none in years" (02/16/2015    Overweight 07/20/2015   Renal insufficiency 12/29/2013     Past Surgical History:  Procedure Laterality Date   CO2 LASER APPLICATION N/A 02/11/2013   Procedure: CO2 LASER APPLICATION;  Surgeon: Romie Levee, MD;  Location: [facility];  Service: General;  Laterality: N/A;   FOOT SURGERY     HIGH RESOLUTION ANOSCOPY N/A 02/11/2013   Procedure: HIGH RESOLUTION ANOSCOPY WITH BIOPSY, LASER ABLATION;  Surgeon: Romie Levee, MD;  Location: [facility];  Service: General;  Laterality: N/A;   WISDOM TOOTH EXTRACTION        Review of Systems  Constitutional:  Negative for chills, diaphoresis, fatigue and fever.  Respiratory:  Negative for cough, chest tightness, shortness of breath and wheezing.   Cardiovascular:  Negative for chest pain.  Gastrointestinal:  Negative for abdominal pain, diarrhea, nausea and vomiting.  Genitourinary:  Positive for frequency and urgency. Negative for dysuria, flank pain and penile discharge.      Objective:    BP (!) 130/90   Pulse 82   Temp (!) 97.5 F (36.4 C) (Temporal)   Resp 16   Wt 236 lb 9.6 oz (107.3 kg)   SpO2 98%   BMI 32.09 kg/m  Nursing note and vital signs reviewed.  Physical Exam Constitutional:      General: He is not in acute distress.    Appearance: He is well-developed.  Eyes:     Conjunctiva/sclera: Conjunctivae normal.  Neck:     Comments: About penny sized lesion located on the left neck that is soft and mildly tender. Mobile. Cardiovascular:     Rate and Rhythm: Normal rate and regular rhythm.     Heart sounds: Normal heart sounds. No murmur heard.    No friction rub. No gallop.  Pulmonary:     Effort: Pulmonary effort is normal. No respiratory distress.     Breath sounds: Normal breath sounds. No wheezing or rales.  Chest:     Chest wall: No tenderness.  Abdominal:     General: Bowel sounds are normal.     Palpations: Abdomen is soft.     Tenderness: There is no abdominal tenderness.   Musculoskeletal:     Cervical back: Neck supple.  Lymphadenopathy:     Cervical: No cervical adenopathy.  Skin:    General: Skin is warm and dry.     Findings: No rash.  Neurological:     Mental Status: He is alert and oriented to person, place, and time.  Psychiatric:        Mood and Affect: Mood normal.         04/01/2023    9:23 AM 03/05/2023   11:08 AM 02/13/2023    2:20 PM 02/05/2023   10:46 AM 01/17/2023   10:02 AM  Depression screen PHQ 2/9  Decreased Interest 0 3 3 3  0  Down, Depressed, Hopeless 1 3 3 3 1   PHQ - 2 Score 1 6 6 6 1   Altered sleeping  3 3 3    Tired, decreased energy  3 3 3    Change in appetite  3 3 3    Feeling bad or failure about yourself   3 3 3    Trouble concentrating  3 3 3    Moving slowly or fidgety/restless  3 1 3    Suicidal thoughts  3 0 3   PHQ-9 Score  27 22 27    Difficult doing work/chores  Extremely dIfficult Very difficult         Assessment & Plan:    Patient Active Problem List   Diagnosis Date Noted   Neck mass 04/01/2023   MDD (major depressive disorder), recurrent severe, without psychosis (HCC) 03/05/2023   Gastroesophageal reflux disease 03/05/2023   Passive suicidal ideations 02/05/2023   OSA (obstructive sleep apnea) 02/05/2023   MDD (major depressive disorder) 12/19/2022   Urinary frequency 12/18/2022   Left foot pain 12/18/2022   Poor social situation 12/18/2022   Snoring 01/22/2022   Encounter for physical examination related to employment 09/26/2021   Human monkeypox 04/06/2021   Cervical pain 02/07/2021   Erectile dysfunction 08/29/2020   Healthcare maintenance 04/05/2020   Foot lesion 12/24/2019   Human immunodeficiency virus (HIV) disease (HCC) 08/25/2019   Gonorrhea 02/11/2019   Hepatitis B surface antigen positive 09/05/2018   Overweight 07/20/2015   Colitis 03/08/2015   HIV (human immunodeficiency virus infection) (HCC)    Tobacco abuse    LGV (lymphogranuloma venereum)    Primary syphilis    Chronic  hepatitis B (HCC) 10/03/2014   HTN (hypertension)    Gynecomastia 04/30/2014   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   Chronic pain associated with significant psychosocial dysfunction 09/18/2012   Fibromyalgia muscle pain 02/11/2012   Depression 12/07/2011   Other constipation 07/16/2011   Anxiety 02/02/2011     Problem List Items Addressed This Visit       Digestive   Chronic hepatitis B (HCC) (Chronic)    Shan continues to have well-controlled virus with no episodes of flare or complications.  Check lab work.  Continue current dose of Biktarvy.      Relevant Medications   bictegravir-emtricitabine-tenofovir AF (BIKTARVY) 50-200-25 MG TABS tablet   Other Relevant Orders   Hepatitis B surface antigen   Hepatitis B surface antibody,qualitative   Hepatitis B DNA, ultraquantitative, PCR     Other   Human immunodeficiency virus (HIV) disease (HCC) - Primary (Chronic)    Shan continues to have well controlled virus with good adherence and tolerance to [employer].  Reviewed lab work and discussed plan of care, U equals U, and family planning. Check lab work. Continue current dose of Biktarvy. Plan for follow up in  4 months or sooner if needed with lab work on the same day.       Relevant Medications   bictegravir-emtricitabine-tenofovir AF (BIKTARVY) 50-200-25 MG TABS tablet   Other Relevant Orders   BASIC METABOLIC PANEL WITH GFR   HIV-1 RNA quant-no reflex-bld   T-helper cell (CD4)- ([facility] clinic only)   Healthcare maintenance    Discussed importance of safe sexual practice and condom use. Condoms and STD testing offered.  Declines vaccinations.       Urinary frequency    Phillip Gill has worsening urinary frequency and urgency despite good adherence and tolerance to finasteride.  Unlikely infection  although will check UA and for STD.  Check PSA.  Possibly related to poorly controlled sleep apnea.  May require referral to urology if symptoms worsen or do not improve.      Relevant Orders    PSA   Urinalysis, Routine w reflex microscopic   Urine cytology ancillary only   Neck mass    Soft tissue lesion located on the left side of the cervical spine and the upper trapezius is soft and mildly tender.  Encouraged to follow-up with sports medicine or primary care for additional imaging.  Nothing seen on recent MRI.        I am having Phillip Gill "Shan" maintain his diclofenac Sodium, amLODipine, finasteride, ARIPiprazole, polyethylene glycol powder, FLUoxetine, esomeprazole, sildenafil, escitalopram, and Biktarvy.   Meds ordered this encounter  Medications   bictegravir-emtricitabine-tenofovir AF (BIKTARVY) 50-200-25 MG TABS tablet    Sig: TAKE 1 TABLET BY MOUTH EVERY DAY    Dispense:  30 tablet    Refill:  5    Order Specific Question:   Supervising Provider    Answer:   Judyann Munson [[id number]]     Follow-up: Return in about 4 months (around 08/01/2023), or if symptoms worsen or fail to improve. or sooner if needed.    Marcos Eke, MSN, FNP-C Nurse Practitioner [facility] for Infectious [facility] Main number: [phone]

## 2023-04-02 LAB — URINE CYTOLOGY ANCILLARY ONLY
Chlamydia: NEGATIVE
Comment: NEGATIVE
Comment: NORMAL
Neisseria Gonorrhea: NEGATIVE

## 2023-04-02 NOTE — Telephone Encounter (Signed)
Sent a message to [facility]  to discuss balanced owed

## 2023-04-03 LAB — BASIC METABOLIC PANEL WITH GFR
BUN: 11 mg/dL (ref 7–25)
CO2: 26 mmol/L (ref 20–32)
Calcium: 9 mg/dL (ref 8.6–10.3)
Chloride: 102 mmol/L (ref 98–110)
Creat: 1.05 mg/dL (ref 0.60–1.29)
Glucose, Bld: 148 mg/dL — ABNORMAL HIGH (ref 65–99)
Potassium: 3.7 mmol/L (ref 3.5–5.3)
Sodium: 139 mmol/L (ref 135–146)
eGFR: 89 mL/min/{1.73_m2} (ref >=60)

## 2023-04-03 LAB — URINALYSIS, ROUTINE W REFLEX MICROSCOPIC
Bilirubin Urine: NEGATIVE
Glucose, UA: NEGATIVE
Hgb urine dipstick: NEGATIVE
Ketones, ur: NEGATIVE
Leukocytes,Ua: NEGATIVE
Nitrite: NEGATIVE
Protein, ur: NEGATIVE
Specific Gravity, Urine: 1.004 (ref 1.001–1.035)
pH: 5.5 (ref 5.0–8.0)

## 2023-04-03 LAB — HEPATITIS B DNA, ULTRAQUANTITATIVE, PCR
Hepatitis B DNA: NOT DETECTED [IU]/mL
Hepatitis B virus DNA: NOT DETECTED {Log}

## 2023-04-03 LAB — HEPATITIS B SURFACE ANTIBODY,QUALITATIVE: Hep B S Ab: REACTIVE — AB

## 2023-04-03 LAB — T-HELPER CELL (CD4) - (RCID CLINIC ONLY)
CD4 % Helper T Cell: 34 % (ref 33–65)
CD4 T Cell Abs: 1216 /uL (ref 400–1790)

## 2023-04-03 LAB — PSA: PSA: 0.76 ng/mL (ref <=4.00)

## 2023-04-03 LAB — HEPATITIS B SURFACE ANTIGEN: Hepatitis B Surface Ag: NONREACTIVE

## 2023-04-03 LAB — HIV-1 RNA QUANT-NO REFLEX-BLD
HIV 1 RNA Quant: 84 {copies}/mL — ABNORMAL HIGH
HIV-1 RNA Quant, Log: 1.92 {Log} — ABNORMAL HIGH

## 2023-04-03 NOTE — Addendum Note (Signed)
Addended by: Raynald Kemp A on: 04/03/2023 09:01 AM   Modules accepted: Orders

## 2023-04-03 NOTE — Telephone Encounter (Signed)
CPAP order, TOC, signed.

## 2023-04-03 NOTE — Addendum Note (Signed)
Addended by: Huston Foley on: 04/03/2023 10:30 AM   Modules accepted: Orders

## 2023-04-03 NOTE — Telephone Encounter (Signed)
New, Doristine Mango, RN; Kathyrn Sheriff; Jeris Penta, Dellie Burns, Clovis Riley Received, thank you!     Previous Messages    ----- Message ----- From: Guy Begin, RN Sent: 04/03/2023  10:53 AM EDT To: Kathyrn Sheriff; Kathe Becton; Rojelio Brenner; * Subject: transfer of care to you all                    Phillip Gill. Harith Mccadden" Male, 47 y.o., 1976/06/04 MRN: [id number]   New order in EPIC.    TOC for cpap  [employer]

## 2023-04-04 ENCOUNTER — Ambulatory Visit: Payer: Medicaid Other | Admitting: Family Medicine

## 2023-04-04 NOTE — Telephone Encounter (Signed)
RE: transfer of care to you all Received: Today New, Doristine Mango, RN; Costella Hatcher,  That is best answered by [facility]. I am adding him.  Thanks,  Nida Boatman New       Previous Messages    ----- Message ----- From: Guy Begin, RN Sent: 04/04/2023   8:13 AM EDT To: Elige Radon New Subject: FW: transfer of care to you all                Good Morning,   Looks like he was trying to get a cpap machine back but due to cost and due to he was out of work waiting for [employer] to assist.  Order was sent to you as I understand you all have a patient assistance program to help with cpap machine.  Is that correct?  Andrey Campanile RN ----- Message ----- From: Kathyrn Sheriff Sent: 04/03/2023   4:15 PM EDT To: Kathyrn Sheriff; Kathe Becton; Rojelio Brenner; * Subject: RE: transfer of care to you all                Hello Dois Davenport,  Is the patient needing a new pap unit or just supplies? Looks like he was just setup, turned in pap unit and setup again? I have no f80f notes showing usage and benefit due to him just being setup. Please advise.  Thank you,  Nida Boatman New  ----- Message ----- From: Kathyrn Sheriff Sent: 04/03/2023  11:15 AM EDT To: Kathyrn Sheriff; Kathe Becton; Rojelio Brenner; * Subject: RE: transfer of care to you all                Received, thank you!  ----- Message ----- From: Guy Begin, RN Sent: 04/03/2023  10:53 AM EDT To: Kathyrn Sheriff; Kathe Becton; Rojelio Brenner; * Subject: transfer of care to you all                    Phillip Gill. [name]" Male, 47 y.o., 04/27/1976 MRN: [id number]   New order in EPIC.    TOC for cpap  [name]

## 2023-04-04 NOTE — Progress Notes (Deleted)
Rubin Payor, PhD, LAT, ATC acting as a scribe for Phillip Graham, MD.  Phillip Gill Phillip Gill is a 47 y.o. male who presents to [facility] at [facility] today for f/u neck and low back pain w/ MRI review. Pt was last seen by Dr. Denyse Amass on 03/14/23 and was prescribed prednisone and labs were obtained. MRI's ordered. Based on findings a cervical ESI was ordered.  Today, pt reports he noticed a "knot" ***  Dx testing:  03/26/23 C-spine & L-spine MRI 01/16/23 L-spine XR 12/19/22 C-spine XR   Pertinent review of systems: ***  Relevant historical information: ***   Exam:  There were no vitals taken for this visit. General: Well Developed, well nourished, and in no acute distress.   MSK: ***    Lab and Radiology Results Results for orders placed or performed in visit on 04/01/23 (from the past 72 hour(s))  T-helper cell (CD4)- ([facility] clinic only)     Status: None   Collection Time: 04/01/23  9:44 AM  Result Value Ref Range   CD4 T Cell Abs 1,216 400 - 1,790 /uL   CD4 % Helper T Cell 34 33 - 65 %    Comment: Performed at [facility], [address] [place], [place] [address]  Urine cytology ancillary only     Status: None   Collection Time: 04/01/23  9:44 AM  Result Value Ref Range   Neisseria Gonorrhea Negative    Chlamydia Negative    Comment Normal Reference Ranger Chlamydia - Negative    Comment      Normal Reference Range Neisseria Gonorrhea - Negative  HIV-1 RNA quant-no reflex-bld     Status: Abnormal   Collection Time: 04/01/23 10:05 AM  Result Value Ref Range   HIV 1 RNA Quant 84 (H) Copies/mL   HIV-1 RNA Quant, Log 1.92 (H) Log cps/mL    Comment: . Reference Range:                           Not Detected     copies/mL                           Not Detected Log copies/mL . Marland Kitchen The test was performed using Real-Time Polymerase Chain Reaction. . . Reportable Range: 20 copies/mL to 10,000,000 copies/mL (1.30 Log copies/mL to 7.00  Log copies/mL). .   PSA     Status: None   Collection Time: 04/01/23 10:05 AM  Result Value Ref Range   PSA 0.76 < OR = 4.00 ng/mL    Comment: The total PSA value from this assay system is  standardized against the WHO standard. The test  result will be approximately 20% lower when compared  to the equimolar-standardized total PSA (Beckman  Coulter). Comparison of serial PSA results should be  interpreted with this fact in mind. . This test was performed using the Siemens  chemiluminescent method. Values obtained from  different assay methods cannot be used interchangeably. PSA levels, regardless of value, should not be interpreted as absolute evidence of the presence or absence of disease.   Urinalysis, Routine w reflex microscopic     Status: None   Collection Time: 04/01/23 10:05 AM  Result Value Ref Range   Color, Urine YELLOW YELLOW   APPearance CLEAR CLEAR   Specific Gravity, Urine 1.004 1.001 - 1.035   pH 5.5 5.0 - 8.0  Glucose, UA NEGATIVE NEGATIVE   Bilirubin Urine NEGATIVE NEGATIVE   Ketones, ur NEGATIVE NEGATIVE   Hgb urine dipstick NEGATIVE NEGATIVE   Protein, ur NEGATIVE NEGATIVE   Nitrite NEGATIVE NEGATIVE   Leukocytes,Ua NEGATIVE NEGATIVE  Hepatitis B surface antigen     Status: None   Collection Time: 04/01/23 10:05 AM  Result Value Ref Range   Hepatitis B Surface Ag NON-REACTIVE NON-REACTIVE    Comment: . For additional information, please refer to  http://education.questdiagn[employer].com/faq/FAQ202  (This link is being provided for informational/ educational purposes only.) .   Hepatitis B surface antibody,qualitative     Status: Abnormal   Collection Time: 04/01/23 10:05 AM  Result Value Ref Range   Hep B S Ab REACTIVE (A) NON-REACTIVE  Hepatitis B DNA, ultraquantitative, PCR     Status: None   Collection Time: 04/01/23 10:05 AM  Result Value Ref Range   Hepatitis B DNA Not Detected IU/mL   Hepatitis B virus DNA Not Detected Log IU/mL     Comment: . Reference Range:                          Not Detected       IU/mL                          Not Detected   Log IU/mL . The analytical performance characteristics of this assay have been determined by [employer]. The modifications have not been cleared or approved by the U.S. Food and Drug Administration. This assay has been validated pursuant to the CLIA regulations and is used for clinical purposes. Marland Kitchen   BASIC METABOLIC PANEL WITH GFR     Status: Abnormal   Collection Time: 04/01/23 10:05 AM  Result Value Ref Range   Glucose, Bld 148 (H) 65 - 99 mg/dL    Comment: .            Fasting reference interval . For someone without known diabetes, a glucose value >125 mg/dL indicates that they may have diabetes and this should be confirmed with a follow-up test. .    BUN 11 7 - 25 mg/dL   Creat 4.09 8.11 - 9.14 mg/dL   eGFR 89 > OR = 60 NW/GNF/6.21H0   BUN/Creatinine Ratio SEE NOTE: 6 - 22 (calc)    Comment:    Not Reported: BUN and Creatinine are within    reference range. .    Sodium 139 135 - 146 mmol/L   Potassium 3.7 3.5 - 5.3 mmol/L   Chloride 102 98 - 110 mmol/L   CO2 26 20 - 32 mmol/L   Calcium 9.0 8.6 - 10.3 mg/dL   No results found.     Assessment and Plan: 47 y.o. male with ***   PDMP not reviewed this encounter. No orders of the defined types were placed in this encounter.  No orders of the defined types were placed in this encounter.    Discussed warning signs or symptoms. Please see discharge instructions. Patient expresses understanding.   ***

## 2023-04-05 ENCOUNTER — Telehealth: Payer: Self-pay | Admitting: Family Medicine

## 2023-04-05 DIAGNOSIS — G8929 Other chronic pain: Secondary | ICD-10-CM

## 2023-04-05 NOTE — Progress Notes (Signed)
Lumbar spine MRI shows a potential pinched nerve in your back that could cause the pain to go down your leg.  I have ordered an epidural steroid injection for this problem.  I ordered a similar injection for your neck as well. Please call  imaging at [phone] to schedule both injections.

## 2023-04-05 NOTE — Telephone Encounter (Signed)
Lumbar epidural steroid injection ordered.

## 2023-04-09 ENCOUNTER — Encounter: Payer: Self-pay | Admitting: Family Medicine

## 2023-04-11 ENCOUNTER — Other Ambulatory Visit: Payer: Self-pay

## 2023-04-11 NOTE — Progress Notes (Signed)
Specialty Pharmacy Refill Coordination Note  Phillip Gill is a 47 y.o. male contacted today regarding refills of specialty medication(s) Bictegravir-Emtricitab-Tenofov   Patient requested No data recorded  Delivery date: 04/19/23   Verified address: [address] [place], [address]   Medication will be filled on 04/18/23.

## 2023-04-12 NOTE — Discharge Instructions (Signed)

## 2023-04-13 ENCOUNTER — Other Ambulatory Visit (HOSPITAL_COMMUNITY): Payer: Self-pay

## 2023-04-15 ENCOUNTER — Ambulatory Visit (HOSPITAL_COMMUNITY): Payer: Medicaid Other | Admitting: Student

## 2023-04-15 ENCOUNTER — Ambulatory Visit
Admission: RE | Admit: 2023-04-15 | Discharge: 2023-04-15 | Disposition: A | Discharge status: Home / Self Care | Payer: Medicaid Other | Source: Ambulatory Visit | Attending: Family Medicine | Admitting: Family Medicine

## 2023-04-15 ENCOUNTER — Other Ambulatory Visit (HOSPITAL_COMMUNITY): Payer: Self-pay

## 2023-04-15 ENCOUNTER — Other Ambulatory Visit: Payer: Self-pay

## 2023-04-15 DIAGNOSIS — G8929 Other chronic pain: Secondary | ICD-10-CM

## 2023-04-15 DIAGNOSIS — M4727 Other spondylosis with radiculopathy, lumbosacral region: Secondary | ICD-10-CM | POA: Diagnosis not present

## 2023-04-15 MED ORDER — IOPAMIDOL (ISOVUE-M 200) INJECTION 41%
1.0000 mL | Freq: Once | INTRAMUSCULAR | Status: AC
  Administered 2023-04-15: 1 mL via EPIDURAL

## 2023-04-15 MED ORDER — METHYLPREDNISOLONE ACETATE 40 MG/ML INJ SUSP (RADIOLOG
80.0000 mg | Freq: Once | INTRAMUSCULAR | Status: AC
  Administered 2023-04-15: 80 mg via EPIDURAL

## 2023-04-18 ENCOUNTER — Other Ambulatory Visit: Payer: Self-pay

## 2023-04-18 DIAGNOSIS — F411 Generalized anxiety disorder: Secondary | ICD-10-CM | POA: Diagnosis not present

## 2023-04-22 ENCOUNTER — Ambulatory Visit (HOSPITAL_COMMUNITY): Payer: Medicaid Other | Admitting: Student

## 2023-04-22 ENCOUNTER — Ambulatory Visit (INDEPENDENT_AMBULATORY_CARE_PROVIDER_SITE_OTHER): Payer: Medicaid Other | Admitting: Family Medicine

## 2023-04-22 ENCOUNTER — Encounter: Payer: Self-pay | Admitting: Family Medicine

## 2023-04-22 VITALS — BP 122/84 | HR 75 | Ht 72.0 in | Wt 229.0 lb

## 2023-04-22 DIAGNOSIS — R590 Localized enlarged lymph nodes: Secondary | ICD-10-CM | POA: Diagnosis not present

## 2023-04-22 DIAGNOSIS — G8929 Other chronic pain: Secondary | ICD-10-CM | POA: Diagnosis not present

## 2023-04-22 DIAGNOSIS — M5412 Radiculopathy, cervical region: Secondary | ICD-10-CM | POA: Diagnosis not present

## 2023-04-22 DIAGNOSIS — G4733 Obstructive sleep apnea (adult) (pediatric): Secondary | ICD-10-CM | POA: Diagnosis not present

## 2023-04-22 DIAGNOSIS — M5442 Lumbago with sciatica, left side: Secondary | ICD-10-CM | POA: Diagnosis not present

## 2023-04-22 NOTE — Progress Notes (Signed)
I, Stevenson Clinch, CMA acting as a scribe for Clementeen Graham, MD.  Phillip Gill is a 47 y.o. male who presents to [facility] at [facility] today for f/u neck and low back pain w/ MRI review. Pt was last seen by Dr. Denyse Amass on 03/14/23 and was prescribed prednisone and labs were obtained. MRI's ordered. Based on findings a cervical ESI was ordered and later performed on 04/15/23.  Today, pt reports he noticed a "knot" on left side of the neck x 1 month. The neck feels stiff at times. The area is not TTP. Denies warmth or erythema. Mass is near hair line, has not had recent haircut.   Dx testing:  03/26/23 C-spine & L-spine MRI 01/16/23 L-spine XR 12/19/22 C-spine XR   Pertinent review of systems: No fevers or chills  Relevant historical information: Hypertension   Exam:  BP 122/84   Pulse 75   Ht 6' (1.829 m)   Wt 229 lb (103.9 kg)   SpO2 96%   BMI 31.06 kg/m  General: Well Developed, well nourished, and in no acute distress.   MSK: Posterior C-spine normal appearing. Nontender mass posterior occipital region feels to be less than 1 cm. Normal cervical motion Upper extremity strength is intact and equal bilaterally.  L-spine normal appearing nontender intact strength.  Lab and Radiology Results   EXAM: MRI LUMBAR SPINE WITHOUT CONTRAST   TECHNIQUE: Multiplanar, multisequence MR imaging of the lumbar spine was performed. No intravenous contrast was administered.   COMPARISON:  Lumbar radiographs 01/16/2023, lumbar spine mri 07/19/2011   FINDINGS: Segmentation: Standard; the lowest formed disc space is designated L5-S1.   Alignment: Normal.   Vertebrae: Vertebral body heights are preserved. Background marrow signal is normal. There is no suspicious marrow signal abnormality or marrow edema.   Conus medullaris and cauda equina: Conus extends to the L1 level. Conus and cauda equina appear normal.   Paraspinal and other soft tissues:  Unremarkable.   Disc levels:   The disc heights are overall preserved wtihotu significant dessication.   T12-L1: No significant spinal canal or neural foraminal stenosis.   L1-L2: No significant spinal canal or neural foraminal stenosis.   L2-L3: No significant spinal canal or neural foraminal stenosis.   L3-L4: No significant spinal canal or neural foraminal stenosis.   L4-L5: There is a mild disc bulge and mild facet arthropathy resulting in mild bilateral neural foraminal stenosis without significant spinal canal stenosis.   L5-S1: There is a mild disc bulge, bilateral endplate spurring, and mild bilateral facet arthropathy resulting in mderate to severe right and moderate left neural foraminal stenosis without significant spinal canal stenosis, progressed since 2013.   IMPRESSION: 1. 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 No other significant spinal canal or neural foraminal stenosis. 2. No acute osseous finding.     Electronically Signed   By: Lesia Hausen M.D.   On: 04/04/2023 08:18  EXAM: MRI CERVICAL SPINE WITHOUT CONTRAST   TECHNIQUE: Multiplanar, multisequence MR imaging of the cervical spine was performed. No intravenous contrast was administered.   COMPARISON:  Prior MRI from 01/04/2012.   FINDINGS: Examination degraded by motion artifact.   Alignment: Straightening with slight reversal of the normal cervical lordosis. No significant listhesis.   Vertebrae: Vertebral body height maintained without acute or chronic fracture. Bone marrow signal intensity within normal limits. No discrete or worrisome osseous lesions. No abnormal marrow edema.   Cord: Normal signal and  morphology.   Posterior Fossa, vertebral arteries, paraspinal tissues: Unremarkable.   Disc levels:   C2-C3: Small central disc protrusion indents the ventral thecal sac. No significant spinal stenosis. Foramina remain  patent.   C3-C4: Mild disc bulge with bilateral uncovertebral spurring. Flattening and partial effacement of the ventral thecal sac without significant spinal stenosis. Mild right C4 foraminal narrowing. Left neural foramina remains patent.   C4-C5: Diffuse disc bulge with endplate and uncovertebral spurring. Flattening and partial effacement of the ventral thecal sac. Superimposed mild right to moderate right-sided facet hypertrophy. No significant spinal stenosis. Moderate right C5 foraminal narrowing. Left neural foramina remains patent.   C5-C6: Diffuse disc bulge with bilateral uncovertebral spurring, worse on the left. Flattening and partial effacement of the ventral thecal sac without significant spinal stenosis. Severe left with moderate right C6 foraminal narrowing.   C6-C7: Mild disc bulge with uncovertebral spurring. No spinal stenosis. Mild bilateral C7 foraminal narrowing.   C7-T1: Negative interspace. Mild left-sided facet hypertrophy. No canal or foraminal stenosis.   IMPRESSION: 1. Multilevel cervical spondylosis without significant spinal stenosis or overt neural impingement. 2. Multifactorial degenerative changes with resultant multilevel foraminal narrowing as above. Notable findings include moderate right C5 foraminal stenosis, severe left with moderate right C6 foraminal narrowing, with mild bilateral C7 foraminal stenosis. 3. Moderate right facet arthrosis at C4-5.     Electronically Signed   By: Rise Mu M.D.   On: 04/01/2023 04:16  I, Clementeen Graham, personally (independently) visualized and performed the interpretation of the images attached in this note.   Assessment and Plan: 47 y.o. male with left posterior C-spine area.  This is thought to be occipital cervical lymphadenopathy.  It is not changing in size and has not larger than 1 cm.  Plan for watchful waiting for now if not improving within 1 month or worsening we will proceed with  ultrasound or CT scan to evaluate more thoroughly.  He was recently's been seen for cervical and lumbar radiculopathy.  He had an epidural steroid injection lumbar spine last week which worked quite well and he feels much better from his leg pain.  He does note some continued mild left cervical radiculopathy but does not think it is bad enough now to proceed with an injection.  Plan for watchful waiting and check back as needed or proceed to advanced imaging as we discussed.  PDMP not reviewed this encounter. No orders of the defined types were placed in this encounter.  No orders of the defined types were placed in this encounter.    Discussed warning signs or symptoms. Please see discharge instructions. Patient expresses understanding.   The above documentation has been reviewed and is accurate and complete Clementeen Graham, M.D.

## 2023-04-22 NOTE — Patient Instructions (Signed)
Thank you for coming in today.   If the bump in the neck does not go down in 1 month let me know and I will get an ultrasound or CT scan done to look.   If it is getting worse we will do that test sooner.   You can have a repeat back injection for leg pain and a neck injection for arm pain when you tell me to.   We can do 3 of them in 6 months.

## 2023-04-22 NOTE — Progress Notes (Deleted)
Psychiatric Initial Adult Assessment  Patient Identification: Phillip Gill MRN:  [id number] Date of Evaluation:  04/22/2023 Referral Source: Phillip Bien, MD  Assessment:  Phillip Gill is a 47 y.o. male with a history of MDD, GAD, panic attacks, **fibromyalgia,  HIV on HAART, OSA not on CPAP, chronic hepatitis B, GERD, hypertension who presents in person to [facility] for medication management.  Patient reports ***  Abilify 5 (increase 03/05/23), lexapro 20, prozac 20 Finasteride, voltaren, bikatravy, esomeprazole, viagra, PEG  Plan:  # *** Past medication trials:  Status of problem: *** Interventions: -- Therapy -- lexapro 20 -- abilify 5  # *** Past medication trials:  Status of problem: *** Interventions: -- ***  # *** Past medication trials:  Status of problem: *** Interventions: -- ***  Return to care in ***  Patient was given contact information for behavioral health clinic and was instructed to call 911 for emergencies.    Patient and plan of care will be discussed with the Attending MD, Dr. ***, who agrees with the above statement and plan.   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  Flora Lipps Hospitalizations: *** Suicide attempts: *** SIB: *** Hx of violence towards others: *** Current access to guns: *** Hx of trauma/abuse: ***  Substance Abuse History in the last 12 months:  {yes no:314532}  Past Medical History:  Past Medical History:  Diagnosis Date   Anxiety    Arthritis    "neck" (02/16/2015)   Chronic hepatitis B (HCC)    SECONDARY TO HIV   Chronic lower back pain    Depression    Fibromyalgia    Genital warts    HIV disease (HCC) 02/28/2015   HIV infection (HCC)    followed by Dr. Luciana Axe- sees him every 4 months   Hypertension    IBS (irritable bowel syndrome)    Migraine    "none in years" (02/16/2015   Overweight 07/20/2015   Renal insufficiency 12/29/2013    Past Surgical History:  Procedure Laterality Date   CO2 LASER APPLICATION N/A 02/11/2013   Procedure: CO2 LASER APPLICATION;  Surgeon: Phillip Levee, MD;  Location[facility];  Service: General;  Laterality: N/A;   FOOT SURGERY     HIGH RESOLUTION ANOSCOPY N/A 02/11/2013   Procedure: HIGH RESOLUTION ANOSCOPY WITH BIOPSY, LASER ABLATION;  Surgeon: Phillip Levee, MD;  Location: [facility];  Service: General;  Laterality: N/A;   WISDOM TOOTH EXTRACTION      Family Psychiatric History: ***  Family History:  Family History  Problem Relation Age of Onset   Hypertension Mother    Diabetes Mother    Stroke Mother        cerbral aneurysm   Hypertension Brother    Sleep apnea Cousin    Mental illness Neg Hx     Social History:   Academic/Vocational: *** Social History  Socioeconomic History   Marital status: Single    Spouse name: Not on file   Number of children: Not on file   Years of education: Not on file   Highest education level: Not on file  Occupational History   Not on file  Tobacco Use   Smoking status: Former    Current packs/day: 0.00    Average packs/day: 1 pack/day for 18.0 years (18.0 ttl pk-yrs)    Types: Cigarettes    Start  date: 07/03/2003    Quit date: 07/02/2021    Years since quitting: 1.8    Passive exposure: Never   Smokeless tobacco: Never   Tobacco comments:    Pt vapes everyday  Vaping Use   Vaping status: Every Day   Substances: Nicotine  Substance and Sexual Activity   Alcohol use: Yes    Comment: occ   Drug use: No   Sexual activity: Not on file    Comment: accepted condoms  Other Topics Concern   Not on file  Social History Narrative   Grew up in [place], now living in [place],-  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    Finished HS.   Doing online classes with [employer]- studying accounting   Previously 2 years of classes at [employer].    Has Partner- Phillip Gill- together for 1 year.    Has 2 girls- (born in 2000).          Social Determinants of Health   Financial Resource Strain: High Risk (02/05/2023)   Overall Financial Resource Strain (CARDIA)    Difficulty of Paying Living Expenses: Very hard  Food Insecurity: Food Insecurity Present (02/05/2023)   Hunger Vital Sign    Worried About Running Out of Food in the Last Year: Sometimes true    Ran Out of Food in the Last Year: Sometimes true  Transportation Needs: Unmet Transportation Needs (02/05/2023)   PRAPARE - Administrator, Civil Service (Medical): Yes    Lack of Transportation (Non-Medical): Yes  Physical Activity: Sufficiently Active (07/16/2018)   [facility], [facility]   Exercise Vital Sign    Days of Exercise per Week: 4 days    Minutes of Exercise per Session: 150+ min  Stress: Stress Concern Present (03/01/2023)   [employer] of Occupational Health - Occupational Stress Questionnaire    Feeling of Stress : To some extent  Social Connections: Unknown (05/23/2022)   [facility], [facility]   Social Network    Social Network: Not on file    Additional Social History: updated  Allergies:   Allergies  Allergen  Reactions   Bactrim [Sulfamethoxazole W/Trimethoprim (Co-Trimoxazole)] Hives and Shortness Of Breath   Sulfa Antibiotics Hives and Shortness Of Breath   Cymbalta [Duloxetine Hcl] Diarrhea   Duloxetine Diarrhea   Other Hives and Swelling    [employer] toothpaste    Zoloft [Sertraline Hcl] Diarrhea   Clindamycin/Lincomycin Rash   Neurontin [Gabapentin] Rash    Current Medications: Current Outpatient Medications  Medication Sig Dispense Refill   amLODipine (NORVASC) 10 MG tablet Take 1 tablet (10 mg total) by mouth daily. 90 tablet 1   ARIPiprazole (ABILIFY) 5 MG tablet Take 1 tablet (5 mg total) by mouth daily. 90 tablet 0   bictegravir-emtricitabine-tenofovir AF (BIKTARVY) 50-200-25 MG TABS tablet TAKE 1 TABLET BY MOUTH EVERY DAY 30 tablet 5   diclofenac Sodium (VOLTAREN) 1 % GEL Apply 4 grams topically 4 (four) times  daily to affected joint. 100 g 11   escitalopram (LEXAPRO) 20 MG tablet Take 1 tablet (20 mg total) by mouth daily. 30 tablet 5   esomeprazole (NEXIUM) 40 MG capsule Take 1 capsule (40 mg total) by mouth every morning. 90 capsule 0   finasteride (PROSCAR) 5 MG tablet Take 1 tablet (5 mg total) by mouth daily. 30 tablet 1   FLUoxetine (PROZAC) 20 MG capsule Take 1 capsule (20 mg total) by mouth daily. 90 capsule 0   polyethylene glycol powder (GLYCOLAX/MIRALAX) 17 GM/SCOOP powder Dissolve 17 grams in liquid and drink by mouth every day. 476 g 0   sildenafil (VIAGRA) 100 MG tablet Take 0.5-1 tablets (50-100 mg total) by mouth daily as needed for erectile dysfunction. Do not exceed 1 tablet per day. 30 tablet 1   No current facility-administered medications for this visit.    ROS: Review of Systems ***  Objective:  Psychiatric Specialty Exam: There were no vitals taken for this visit.There is no height or weight on file to calculate BMI.  General Appearance: {Appearance:22683}  Eye Contact:  {BHH EYE CONTACT:22684}  Speech:  {Speech:22685}  Volume:  {Volume (PAA):22686}   Mood:  {BHH MOOD:22306}  Affect:  {Affect (PAA):22687}  Thought Content: {Thought Content:22690}   Suicidal Thoughts:  {ST/HT (PAA):22692}  Homicidal Thoughts:  {ST/HT (PAA):22692}  Thought Process:  {Thought Process (PAA):22688}  Orientation:  {BHH ORIENTATION (PAA):22689}    Memory: {BHH GMWNUU:72536}  Judgment:  {Judgement (PAA):22694}  Insight:  {Insight (PAA):22695}  Concentration:  {Concentration:21399}  Recall:  not formally assessed ***  Fund of Knowledge: {BHH GOOD/FAIR/POOR:22877}  Language: {BHH GOOD/FAIR/POOR:22877}  Psychomotor Activity:  {Psychomotor (PAA):22696}  Akathisia:  {BHH YES OR NO:22294}  AIMS (if indicated): {Desc; done/not:10129}  Assets:  {Assets (PAA):22698}  ADL's:  {BHH UYQ'I:34742}  Cognition: {chl bhh cognition:304700322}  Sleep:  {BHH GOOD/FAIR/POOR:22877}   PE: General: well-appearing; no acute distress *** Pulm: no increased work of breathing on room air *** Strength & Muscle Tone: {desc; muscle tone:32375} Neuro: no focal neurological deficits observed *** Gait & Station: {PE GAIT ED VZDG:38756}  Metabolic Disorder Labs: Lab Results  Component Value Date   HGBA1C 5.5 10/02/2014   MPG 111 10/02/2014   No results found for: "PROLACTIN" Lab Results  Component Value Date   CHOL 143 10/23/2022   TRIG 127 10/23/2022   HDL 30 (L) 10/23/2022   CHOLHDL 4.8 10/23/2022   VLDL 16 02/14/2015   LDLCALC 91 10/23/2022   LDLCALC 106 (H) 05/14/2022   No results found for: "TSH"  Therapeutic Level Labs: No results found for: "LITHIUM" No results found for: "CBMZ" No results found for: "VALPROATE"  Screenings:  GAD-7    Flowsheet Row Office Visit from 03/05/2023 in [facility] Visit from 02/05/2023 in [facility]  Total GAD-7 Score 21 21      PHQ2-9    Flowsheet Row Office Visit from 04/01/2023 in [facility] for Infectious Disease Office  Visit from 03/05/2023 in [facility] Patient Outreach Telephone from 02/13/2023 in [place] [facility]PART[facility] Office Visit from 02/05/2023 in [facility] [place] Visit from 01/17/2023 in [facility] for Infectious Disease  PHQ-2 Total Score 1 6 6 6 1   PHQ-9 Total Score -- 27 22 27  --      Flowsheet Row ED from 12/19/2022 in [facility] ED from 06/02/2022  in [facility] Emergency Department at [facility] ED from 03/26/2022 in [facility] RISK CATEGORY Low Risk No Risk No Risk       Collaboration of Care: Collaboration of Care: [facility] OP Collaboration of Care:[id number]}  Patient/Guardian was advised Release of Information must be obtained prior to any record release in order to collaborate their care with an outside provider. Patient/Guardian was advised if they have not already done so to contact the registration department to sign all necessary forms in order for [place] to release information regarding their care.   Consent: Patient/Guardian gives verbal consent for treatment and assignment of benefits for services provided during this visit. Patient/Guardian expressed understanding and agreed to proceed.   Park Pope, MD 10/21/202412:54 PM

## 2023-04-25 ENCOUNTER — Other Ambulatory Visit (HOSPITAL_COMMUNITY)
Admission: RE | Admit: 2023-04-25 | Discharge: 2023-04-25 | Disposition: A | Discharge status: Home / Self Care | Payer: Medicaid Other | Source: Ambulatory Visit | Attending: Oncology | Admitting: Oncology

## 2023-04-25 DIAGNOSIS — Z006 Encounter for examination for normal comparison and control in clinical research program: Secondary | ICD-10-CM | POA: Insufficient documentation

## 2023-04-29 ENCOUNTER — Other Ambulatory Visit: Payer: Self-pay

## 2023-04-30 ENCOUNTER — Other Ambulatory Visit: Payer: Self-pay

## 2023-04-30 ENCOUNTER — Ambulatory Visit (HOSPITAL_COMMUNITY): Payer: Medicaid Other | Admitting: Licensed Clinical Social Worker

## 2023-04-30 ENCOUNTER — Encounter: Payer: Self-pay | Admitting: Family Medicine

## 2023-04-30 ENCOUNTER — Other Ambulatory Visit (HOSPITAL_COMMUNITY): Payer: Self-pay

## 2023-04-30 ENCOUNTER — Ambulatory Visit (INDEPENDENT_AMBULATORY_CARE_PROVIDER_SITE_OTHER): Payer: Medicaid Other | Admitting: Family Medicine

## 2023-04-30 VITALS — BP 132/82 | HR 75 | Temp 97.2°F | Wt 236.0 lb

## 2023-04-30 DIAGNOSIS — K219 Gastro-esophageal reflux disease without esophagitis: Secondary | ICD-10-CM | POA: Diagnosis not present

## 2023-04-30 DIAGNOSIS — F419 Anxiety disorder, unspecified: Secondary | ICD-10-CM

## 2023-04-30 DIAGNOSIS — N521 Erectile dysfunction due to diseases classified elsewhere: Secondary | ICD-10-CM | POA: Diagnosis not present

## 2023-04-30 DIAGNOSIS — K5909 Other constipation: Secondary | ICD-10-CM | POA: Diagnosis not present

## 2023-04-30 DIAGNOSIS — F332 Major depressive disorder, recurrent severe without psychotic features: Secondary | ICD-10-CM

## 2023-04-30 DIAGNOSIS — R351 Nocturia: Secondary | ICD-10-CM | POA: Insufficient documentation

## 2023-04-30 DIAGNOSIS — G4733 Obstructive sleep apnea (adult) (pediatric): Secondary | ICD-10-CM

## 2023-04-30 MED ORDER — FINASTERIDE 5 MG PO TABS
5.0000 mg | ORAL_TABLET | Freq: Every day | ORAL | 1 refills | Status: DC
  Filled 2023-04-30: qty 30, 30d supply, fill #0
  Filled 2023-05-01 – 2023-05-27 (×2): qty 30, 30d supply, fill #1

## 2023-04-30 MED ORDER — TADALAFIL 10 MG PO TABS
5.0000 mg | ORAL_TABLET | Freq: Every day | ORAL | 0 refills | Status: DC
Start: 2023-04-30 — End: 2023-08-04
  Filled 2023-04-30: qty 15, 30d supply, fill #0
  Filled 2023-05-01 – 2023-05-22 (×2): qty 15, 30d supply, fill #1
  Filled 2023-06-09 – 2023-06-17 (×2): qty 15, 30d supply, fill #2

## 2023-04-30 MED ORDER — FAMOTIDINE 40 MG PO TABS
40.0000 mg | ORAL_TABLET | Freq: Every day | ORAL | 0 refills | Status: DC
Start: 2023-04-30 — End: 2023-08-04
  Filled 2023-04-30: qty 90, 90d supply, fill #0

## 2023-04-30 MED ORDER — ESOMEPRAZOLE MAGNESIUM 40 MG PO CPDR
40.0000 mg | DELAYED_RELEASE_CAPSULE | Freq: Every day | ORAL | 0 refills | Status: DC
  Filled 2023-04-30 – 2023-05-14 (×4): qty 90, 90d supply, fill #0

## 2023-04-30 NOTE — Assessment & Plan Note (Signed)
Continue CPAP.  

## 2023-04-30 NOTE — Progress Notes (Signed)
Assessment/Plan:   Problem List Items Addressed This Visit       Respiratory   OSA on CPAP    Continue CPAP        Digestive   Gastroesophageal reflux disease - Primary    Some improvement with esomeprazole 40 mg daily, recommend adding famotidine 40 mg at night as needed until GI consult.      Relevant Medications   famotidine (PEPCID) 40 MG tablet   esomeprazole (NEXIUM) 40 MG capsule     Other   Anxiety   Other constipation    Stable.  Continue MiraLAX.  Follow-up with gastroenterology      Depression    Improved.  Plan: Continue escitalopram 20 mg Continue aripiprazole 5 mg daily Removed fluoxetine from medication list Follow-up with psychiatry       Erectile dysfunction    Stable on Viagra as needed.  However, changing therapy to tadalafil for ongoing nocturia.   Plan: Discontinue sildenafil Start tadalafil 5 mg daily Monitor effectiveness for ED      Nocturia    Likely multifactorial. Possibly OSA vs. BPH. PSA and UA normal. Recent initiation of CPAP therapy 2 weeks ago. Reports compliance.   Plan:  Continue finasteride 5mg  daily Add tadalafil 5 mg daily Consider titrating up versus referral to urology based on response       Relevant Medications   tadalafil (CIALIS) 10 MG tablet   finasteride (PROSCAR) 5 MG tablet    Medications Discontinued During This Encounter  Medication Reason   sildenafil (VIAGRA) 100 MG tablet    FLUoxetine (PROZAC) 20 MG capsule    finasteride (PROSCAR) 5 MG tablet Reorder   esomeprazole (NEXIUM) 40 MG capsule Reorder    No follow-ups on file.    Subjective:   Encounter date: 04/30/2023  Phillip Gill is a 47 y.o. male who has Anxiety; Other constipation; Depression; Fibromyalgia muscle pain; Anal condyloma; Gynecomastia; HTN (hypertension); Chronic hepatitis B (HCC); LGV (lymphogranuloma venereum); Primary syphilis; Colitis; HIV (human immunodeficiency virus infection) (HCC); Tobacco abuse;  Overweight; Chronic pain associated with significant psychosocial dysfunction; Gonorrhea; Human immunodeficiency virus (HIV) disease (HCC); Foot lesion; Hepatitis B surface antigen positive; Healthcare maintenance; Erectile dysfunction; Cervical pain; Human monkeypox; Encounter for physical examination related to employment; Snoring; Urinary frequency; Left foot pain; Poor social situation; MDD (major depressive disorder); Passive suicidal ideations; OSA on CPAP; MDD (major depressive disorder), recurrent severe, without psychosis (HCC); Gastroesophageal reflux disease; Neck mass; and Nocturia on their problem list..   He  has a past medical history of Anxiety, Arthritis, Chronic hepatitis B (HCC), Chronic lower back pain, Depression, Fibromyalgia, Genital warts, HIV disease (HCC) (02/28/2015), HIV infection (HCC), Hypertension, IBS (irritable bowel syndrome), Migraine, Overweight (07/20/2015), and Renal insufficiency (12/29/2013)..   Chief Complaints: Follow up on Heartburn and complaint of increased urge to urinate  History of Present Illness:  Heartburn: The patient reports persistent but mild heartburn. States it has improved slightly but not resolved. Currently taking esomeprazole 40 mg once daily before breakfast. Reports taking it 1 hour before eating.  Urinary symptoms: Urge to urinate frequently, especially nocturia and frequent waking up to urinate. Saw an infectious disease doctor who checked basic lab work, including urinalysis and PSA that were negative for infection, glucosuria, or prostatitis.   Constipation: Stable on MiraLAX as needed.  Depression.  Improved on increased dose of aripiprazole.  Reports currently taking escitalopram 20 mg daily.  Fluoxetine was also on the list.  Patient declined taking this.  04/01/2023    9:23 AM 03/05/2023   11:08 AM 02/13/2023    2:20 PM 02/05/2023   10:46 AM 01/17/2023   10:02 AM  Depression screen PHQ 2/9  Decreased Interest 0 3 3 3  0   Down, Depressed, Hopeless 1 3 3 3 1   PHQ - 2 Score 1 6 6 6 1   Altered sleeping  3 3 3    Tired, decreased energy  3 3 3    Change in appetite  3 3 3    Feeling bad or failure about yourself   3 3 3    Trouble concentrating  3 3 3    Moving slowly or fidgety/restless  3 1 3    Suicidal thoughts  3 0 3   PHQ-9 Score  27 22 27    Difficult doing work/chores  Extremely dIfficult Very difficult        03/05/2023   11:08 AM 02/05/2023   10:46 AM  GAD 7 : Generalized Anxiety Score  Nervous, Anxious, on Edge 3 3  Control/stop worrying 3 3  Worry too much - different things 3 3  Trouble relaxing 3 3  Restless 3 3  Easily annoyed or irritable 3 3  Afraid - awful might happen 3 3  Total GAD 7 Score 21 21  Anxiety Difficulty Extremely difficult      Review of Systems  Constitutional:  Negative for chills, diaphoresis, fever, malaise/fatigue and weight loss.  HENT:  Negative for congestion, ear discharge, ear pain and hearing loss.   Eyes:  Negative for blurred vision, double vision, photophobia, pain, discharge and redness.  Respiratory:  Negative for cough, sputum production, shortness of breath and wheezing.   Cardiovascular:  Negative for chest pain and palpitations.  Gastrointestinal:  Positive for constipation and heartburn. Negative for abdominal pain, blood in stool, diarrhea, melena, nausea and vomiting.  Genitourinary:  Negative for dysuria, flank pain, frequency, hematuria and urgency.  Musculoskeletal:  Negative for myalgias.  Skin:  Negative for itching and rash.  Neurological:  Negative for dizziness, tingling, tremors, speech change, seizures, loss of consciousness, weakness and headaches.  Psychiatric/Behavioral:  Negative for depression, hallucinations, memory loss, substance abuse and suicidal ideas (No plan, no HI). The patient is not nervous/anxious and does not have insomnia.   All other systems reviewed and are negative.   Past Surgical History:  Procedure Laterality Date    CO2 LASER APPLICATION N/A 02/11/2013   Procedure: CO2 LASER APPLICATION;  Surgeon: Romie Levee, MD;  Location: [facility] [place];  Service: General;  Laterality: N/A;   FOOT SURGERY     HIGH RESOLUTION ANOSCOPY N/A 02/11/2013   Procedure: HIGH RESOLUTION ANOSCOPY WITH BIOPSY, LASER ABLATION;  Surgeon: Romie Levee, MD;  Location: [facility];  Service: General;  Laterality: N/A;   WISDOM TOOTH EXTRACTION      Outpatient Medications Prior to Visit  Medication Sig Dispense Refill   amLODipine (NORVASC) 10 MG tablet Take 1 tablet (10 mg total) by mouth daily. 90 tablet 1   ARIPiprazole (ABILIFY) 5 MG tablet Take 1 tablet (5 mg total) by mouth daily. 90 tablet 0   bictegravir-emtricitabine-tenofovir AF (BIKTARVY) 50-200-25 MG TABS tablet TAKE 1 TABLET BY MOUTH EVERY DAY 30 tablet 5   escitalopram (LEXAPRO) 20 MG tablet Take 1 tablet (20 mg total) by mouth daily. 30 tablet 5   polyethylene glycol powder (GLYCOLAX/MIRALAX) 17 GM/SCOOP powder Dissolve 17 grams in liquid and drink by mouth every day. 476 g 0   esomeprazole (NEXIUM) 40 MG capsule  Take 1 capsule (40 mg total) by mouth every morning. 90 capsule 0   finasteride (PROSCAR) 5 MG tablet Take 1 tablet (5 mg total) by mouth daily. 30 tablet 1   FLUoxetine (PROZAC) 20 MG capsule Take 1 capsule (20 mg total) by mouth daily. 90 capsule 0   sildenafil (VIAGRA) 100 MG tablet Take 0.5-1 tablets (50-100 mg total) by mouth daily as needed for erectile dysfunction. Do not exceed 1 tablet per day. 30 tablet 1   diclofenac Sodium (VOLTAREN) 1 % GEL Apply 4 grams topically 4 (four) times daily to affected joint. (Patient not taking: Reported on 04/30/2023) 100 g 11   No facility-administered medications prior to visit.    Family History  Problem Relation Age of Onset   Hypertension Mother    Diabetes Mother    Stroke Mother        cerbral aneurysm   Hypertension Brother    Sleep apnea Cousin    Mental illness Neg Hx      Social History   Socioeconomic History   Marital status: Single    Spouse name: Not on file   Number of children: Not on file   Years of education: Not on file   Highest education level: Not on file  Occupational History   Not on file  Tobacco Use   Smoking status: Former    Current packs/day: 0.00    Average packs/day: 1 pack/day for 18.0 years (18.0 ttl pk-yrs)    Types: Cigarettes    Start date: 07/03/2003    Quit date: 07/02/2021    Years since quitting: 1.8    Passive exposure: Never   Smokeless tobacco: Never   Tobacco comments:    Pt vapes everyday  Vaping Use   Vaping status: Every Day   Substances: Nicotine  Substance and Sexual Activity   Alcohol use: Yes    Comment: occ   Drug use: No   Sexual activity: Not on file    Comment: accepted condoms  Other Topics Concern   Not on file  Social History Narrative   Grew up in [place], now living in [place],-  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    Finished HS.   Doing online classes with [employer]- studying accounting   Previously 2 years of classes at [employer].    Has Partner- [name]- together for 1 year.    Has 2 girls- (born in 2000).          Social Determinants of Health   Financial Resource Strain: High Risk (02/05/2023)   Overall Financial Resource Strain (CARDIA)    Difficulty of Paying Living Expenses: Very hard  Food Insecurity: Food Insecurity Present (02/05/2023)   Hunger Vital Sign    Worried About Running Out of Food in the Last Year: Sometimes true    Ran Out of Food in the Last Year: Sometimes true  Transportation Needs: Unmet Transportation Needs (02/05/2023)   PRAPARE - Administrator, Civil Service (Medical): Yes    Lack of Transportation (Non-Medical): Yes  Physical Activity: Sufficiently Active (07/16/2018)   [facility], [facility]   Exercise Vital Sign    Days of Exercise per Week: 4 days    Minutes  of Exercise per Session: 150+ min  Stress: Stress Concern Present (03/01/2023)   [employer] of Occupational Health - Occupational Stress Questionnaire    Feeling of Stress : To some extent  Social Connections: Unknown (05/23/2022)  Received from [facility], [facility]   Social Network    Social Network: Not on file  Intimate Partner Violence: Unknown (05/23/2022)   Received from [facility], [facility]   HITS    Physically Hurt: Not on file    Insult or Talk Down To: Not on file    Threaten Physical Harm: Not on file    Scream or Curse: Not on file                                                                                                  Objective:  Physical Exam: BP 132/82 (BP Location: Left Arm, Patient Position: Sitting, Cuff Size: Large)   Pulse 75   Temp (!) 97.2 F (36.2 C) (Temporal)   Wt 236 lb (107 kg)   SpO2 95%   BMI 32.01 kg/m     Physical Exam  DG INJECT DIAG/THERA/INC NEEDLE/CATH/PLC EPI/LUMB/SAC W/IMG  Result Date: 04/15/2023 CLINICAL DATA:  Spondylosis without myelopathy. Degenerative changes L5-S1. Low back pain. Bilateral leg symptoms, slightly more pronounced on the left than the right. FLUOROSCOPY: Radiation Exposure Index (as provided by the fluoroscopic device): 0 minutes 25 seconds 42.63 micro gray meter squared PROCEDURE: The procedure, risks, benefits, and alternatives were explained to the patient. Questions regarding the procedure were encouraged and answered. The patient understands and consents to the procedure. LUMBAR EPIDURAL INJECTION: An interlaminar approach was performed on the left at L5-S1. The overlying skin was cleansed and anesthetized. A 20 gauge epidural needle was advanced using loss-of-resistance technique. DIAGNOSTIC EPIDURAL INJECTION: Injection of Isovue-M 200 shows a good epidural pattern with spread above and below the level of needle placement, primarily on the left. No vascular opacification is seen.  THERAPEUTIC EPIDURAL INJECTION: Eighty mg of Depo-Medrol mixed with 3 cc 1% lidocaine were instilled. The procedure was well-tolerated, and the patient was discharged thirty minutes following the injection in good condition. COMPLICATIONS: None IMPRESSION: Technically successful L5-S1 left epidural steroid injection. Electronically Signed   By: Paulina Fusi M.D.   On: 04/15/2023 12:32   MR LUMBAR SPINE WO CONTRAST  Result Date: 04/04/2023 CLINICAL DATA:  Low back pain radiating to both legs for 10 years EXAM: MRI LUMBAR SPINE WITHOUT CONTRAST TECHNIQUE: Multiplanar, multisequence MR imaging of the lumbar spine was performed. No intravenous contrast was administered. COMPARISON:  Lumbar radiographs 01/16/2023, lumbar spine mri 07/19/2011 FINDINGS: Segmentation: Standard; the lowest formed disc space is designated L5-S1. Alignment: Normal. Vertebrae: Vertebral body heights are preserved. Background marrow signal is normal. There is no suspicious marrow signal abnormality or marrow edema. Conus medullaris and cauda equina: Conus extends to the L1 level. Conus and cauda equina appear normal. Paraspinal and other soft tissues: Unremarkable. Disc levels: The disc heights are overall preserved wtihotu significant dessication. T12-L1: No significant spinal canal or neural foraminal stenosis. L1-L2: No significant spinal canal or neural foraminal stenosis. L2-L3: No significant spinal canal or neural foraminal stenosis. L3-L4: No significant spinal canal or neural foraminal stenosis. L4-L5: There is a mild disc bulge and mild facet arthropathy resulting in mild bilateral neural foraminal stenosis without significant spinal  canal stenosis. L5-S1: There is a mild disc bulge, bilateral endplate spurring, and mild bilateral facet arthropathy resulting in mderate to severe right and moderate left neural foraminal stenosis without significant spinal canal stenosis, progressed since 2013. IMPRESSION: 1. Moderate to severe right  and moderate left neural foraminal stenosis at L5-S1, progressed since 2013. No other significant spinal canal or neural foraminal stenosis. 2. No acute osseous finding. Electronically Signed   By: Lesia Hausen M.D.   On: 04/04/2023 08:18   MR CERVICAL SPINE WO CONTRAST  Result Date: 04/01/2023 CLINICAL DATA:  Initial evaluation for cervical radiculopathy, chronic neck pain with radiation into both arms with numbness. EXAM: MRI CERVICAL SPINE WITHOUT CONTRAST TECHNIQUE: Multiplanar, multisequence MR imaging of the cervical spine was performed. No intravenous contrast was administered. COMPARISON:  Prior MRI from 01/04/2012. FINDINGS: Examination degraded by motion artifact. Alignment: Straightening with slight reversal of the normal cervical lordosis. No significant listhesis. Vertebrae: Vertebral body height maintained without acute or chronic fracture. Bone marrow signal intensity within normal limits. No discrete or worrisome osseous lesions. No abnormal marrow edema. Cord: Normal signal and morphology. Posterior Fossa, vertebral arteries, paraspinal tissues: Unremarkable. Disc levels: C2-C3: Small central disc protrusion indents the ventral thecal sac. No significant spinal stenosis. Foramina remain patent. C3-C4: Mild disc bulge with bilateral uncovertebral spurring. Flattening and partial effacement of the ventral thecal sac without significant spinal stenosis. Mild right C4 foraminal narrowing. Left neural foramina remains patent. C4-C5: Diffuse disc bulge with endplate and uncovertebral spurring. Flattening and partial effacement of the ventral thecal sac. Superimposed mild right to moderate right-sided facet hypertrophy. No significant spinal stenosis. Moderate right C5 foraminal narrowing. Left neural foramina remains patent. C5-C6: Diffuse disc bulge with bilateral uncovertebral spurring, worse on the left. Flattening and partial effacement of the ventral thecal sac without significant spinal  stenosis. Severe left with moderate right C6 foraminal narrowing. C6-C7: Mild disc bulge with uncovertebral spurring. No spinal stenosis. Mild bilateral C7 foraminal narrowing. C7-T1: Negative interspace. Mild left-sided facet hypertrophy. No canal or foraminal stenosis. IMPRESSION: 1. Multilevel cervical spondylosis without significant spinal stenosis or overt neural impingement. 2. Multifactorial degenerative changes with resultant multilevel foraminal narrowing as above. Notable findings include moderate right C5 foraminal stenosis, severe left with moderate right C6 foraminal narrowing, with mild bilateral C7 foraminal stenosis. 3. Moderate right facet arthrosis at C4-5. Electronically Signed   By: Rise Mu M.D.   On: 04/01/2023 04:16    Recent Results (from the past 2160 hour(s))  H. pylori breath test     Status: None   Collection Time: 03/05/23 11:02 AM  Result Value Ref Range   H. pylori Breath Test NOT DETECTED NOT DETECTED    Comment: . Antimicrobials, proton pump inhibitors, and bismuth preparations are known to suppress H. pylori, and  ingestion of these prior to H. pylori diagnostic testing may lead to false negative results. If clinically  indicated, the test may be repeated on a new specimen obtained two weeks after discontinuing treatment. However, a positive result is still clinically valid.   CK (Creatine Kinase)     Status: None   Collection Time: 03/14/23 10:04 AM  Result Value Ref Range   Total CK 170 7 - 232 U/L  Sedimentation rate     Status: Abnormal   Collection Time: 03/14/23 10:04 AM  Result Value Ref Range   Sed Rate 39 (H) 0 - 15 mm/hr  T-helper cell (CD4)- ([facility] clinic only)     Status: None   Collection  Time: 04/01/23  9:44 AM  Result Value Ref Range   CD4 T Cell Abs 1,216 400 - 1,790 /uL   CD4 % Helper T Cell 34 33 - 65 %    Comment: Performed at [facility], [address], [place], [place] [address]  Urine cytology  ancillary only     Status: None   Collection Time: 04/01/23  9:44 AM  Result Value Ref Range   Neisseria Gonorrhea Negative    Chlamydia Negative    Comment Normal Reference Ranger Chlamydia - Negative    Comment      Normal Reference Range Neisseria Gonorrhea - Negative  HIV-1 RNA quant-no reflex-bld     Status: Abnormal   Collection Time: 04/01/23 10:05 AM  Result Value Ref Range   HIV 1 RNA Quant 84 (H) Copies/mL   HIV-1 RNA Quant, Log 1.92 (H) Log cps/mL    Comment: . Reference Range:                           Not Detected     copies/mL                           Not Detected Log copies/mL . Marland Kitchen The test was performed using Real-Time Polymerase Chain Reaction. . . Reportable Range: 20 copies/mL to 10,000,000 copies/mL (1.30 Log copies/mL to 7.00 Log copies/mL). .   PSA     Status: None   Collection Time: 04/01/23 10:05 AM  Result Value Ref Range   PSA 0.76 < OR = 4.00 ng/mL    Comment: The total PSA value from this assay system is  standardized against the WHO standard. The test  result will be approximately 20% lower when compared  to the equimolar-standardized total PSA (Beckman  Coulter). Comparison of serial PSA results should be  interpreted with this fact in mind. . This test was performed using the Siemens  chemiluminescent method. Values obtained from  different assay methods cannot be used interchangeably. PSA levels, regardless of value, should not be interpreted as absolute evidence of the presence or absence of disease.   Urinalysis, Routine w reflex microscopic     Status: None   Collection Time: 04/01/23 10:05 AM  Result Value Ref Range   Color, Urine YELLOW YELLOW   APPearance CLEAR CLEAR   Specific Gravity, Urine 1.004 1.001 - 1.035   pH 5.5 5.0 - 8.0   Glucose, UA NEGATIVE NEGATIVE   Bilirubin Urine NEGATIVE NEGATIVE   Ketones, ur NEGATIVE NEGATIVE   Hgb urine dipstick NEGATIVE NEGATIVE   Protein, ur NEGATIVE NEGATIVE   Nitrite NEGATIVE  NEGATIVE   Leukocytes,Ua NEGATIVE NEGATIVE  Hepatitis B surface antigen     Status: None   Collection Time: 04/01/23 10:05 AM  Result Value Ref Range   Hepatitis B Surface Ag NON-REACTIVE NON-REACTIVE    Comment: . For additional information, please refer to  http://education.[facility].com/faq/FAQ202  (This link is being provided for informational/ educational purposes only.) .   Hepatitis B surface antibody,qualitative     Status: Abnormal   Collection Time: 04/01/23 10:05 AM  Result Value Ref Range   Hep B S Ab REACTIVE (A) NON-REACTIVE  Hepatitis B DNA, ultraquantitative, PCR     Status: None   Collection Time: 04/01/23 10:05 AM  Result Value Ref Range   Hepatitis B DNA Not Detected IU/mL   Hepatitis B virus DNA Not Detected Log IU/mL  Comment: . Reference Range:                          Not Detected       IU/mL                          Not Detected   Log IU/mL . The analytical performance characteristics of this assay have been determined by [employer]. The modifications have not been cleared or approved by the U.S. Food and Drug Administration. This assay has been validated pursuant to the CLIA regulations and is used for clinical purposes. Marland Kitchen   BASIC METABOLIC PANEL WITH GFR     Status: Abnormal   Collection Time: 04/01/23 10:05 AM  Result Value Ref Range   Glucose, Bld 148 (H) 65 - 99 mg/dL    Comment: .            Fasting reference interval . For someone without known diabetes, a glucose value >125 mg/dL indicates that they may have diabetes and this should be confirmed with a follow-up test. .    BUN 11 7 - 25 mg/dL   Creat 2.02 5.42 - 7.06 mg/dL   eGFR 89 > OR = 60 CB/JSE/8.31D1   BUN/Creatinine Ratio SEE NOTE: 6 - 22 (calc)    Comment:    Not Reported: BUN and Creatinine are within    reference range. .    Sodium 139 135 - 146 mmol/L   Potassium 3.7 3.5 - 5.3 mmol/L   Chloride 102 98 - 110 mmol/L   CO2 26 20 - 32 mmol/L   Calcium  9.0 8.6 - 10.3 mg/dL        Garner Nash, MD, MS

## 2023-04-30 NOTE — Assessment & Plan Note (Signed)
Improved.  Plan: Continue escitalopram 20 mg Continue aripiprazole 5 mg daily Removed fluoxetine from medication list Follow-up with psychiatry

## 2023-04-30 NOTE — Assessment & Plan Note (Signed)
Stable.  Continue MiraLAX.  Follow-up with gastroenterology

## 2023-04-30 NOTE — Assessment & Plan Note (Signed)
Stable on Viagra as needed.  However, changing therapy to tadalafil for ongoing nocturia.   Plan: Discontinue sildenafil Start tadalafil 5 mg daily Monitor effectiveness for ED

## 2023-04-30 NOTE — Assessment & Plan Note (Signed)
Some improvement with esomeprazole 40 mg daily, recommend adding famotidine 40 mg at night as needed until GI consult.

## 2023-04-30 NOTE — Assessment & Plan Note (Addendum)
Likely multifactorial. Possibly OSA vs. BPH. PSA and UA normal. Recent initiation of CPAP therapy 2 weeks ago. Reports compliance.   Plan:  Continue finasteride 5mg  daily Add tadalafil 5 mg daily Consider titrating up versus referral to urology based on response

## 2023-05-01 ENCOUNTER — Other Ambulatory Visit: Payer: Self-pay

## 2023-05-01 ENCOUNTER — Other Ambulatory Visit: Payer: Self-pay | Admitting: Family Medicine

## 2023-05-01 DIAGNOSIS — K219 Gastro-esophageal reflux disease without esophagitis: Secondary | ICD-10-CM

## 2023-05-02 NOTE — Progress Notes (Unsigned)
Psychiatric Initial Adult Assessment  Patient Identification: Phillip Gill MRN:  [id number] Date of Evaluation:  05/02/2023 Referral Source: [name], MD  Assessment:  Phillip Gill is a 47 y.o. male with a history of MDD, GAD, panic attacks, HIV on HAART, OSA not on CPAP, chronic hepatitis B, anal condyloma, GERD, hypertension who presents in person to [facility] for medication management.  Patient reports significant depressive symptoms and anxiety with associated panic attacks.  He has found Lexapro and Abilify mildly improve his mood symptoms however for he still remains significantly depressed.  He has been unemployed since January 2024 after a breakdown and has been on a ball to return to work.  Due to the severity of his mental health symptoms and the impact it has had on his daily living, we discussed referral to [facility] which he was amenable to.  In addition, given his significant neurovegetative symptoms that appear related to his MDD, we elected to start Wellbutrin XL 150 mg daily as well as Inderal 10 mg twice daily as needed for social anxiety.  Will obtain a vitamin D level to identify if there are any other factors that would be contributing to his depressive symptoms.  Plan:  # Major Depressive Disorder, recurrent episode Past medication trials:  Status of problem: active Interventions: -- Referral for PHP  -- Recommend psychotherapy  -- Continue lexapro 20 milligrams daily -- Continue abilify 5 milligrams daily  -A1c ordered, lipid panel wnl  - ECG 01/2022 Qtc 413 --Start bupropion XL 150 mg daily  -No history of seizures  # Generalized anxiety disorder with panic attack Past medication trials:  Status of problem: Active Interventions: -- Lexapro as above -- Abilify as above --Start propranolol 10 mg twice daily as needed for anxiety  Return to care in 1 month  Patient was given contact information for behavioral health clinic and  was instructed to call 911 for emergencies.    Patient and plan of care will be discussed with the Attending MD, Dr. [name], who agrees with the above statement and plan.   Subjective:  Chief Complaint: Medication Management  History of Present Illness:   Patient reports presenting to psychiatry today due to severe depressive symptoms and anxiety with panic attacks.  He reports having a history of major depression as well as anxiety dating back to 1998 following grandmother's death.  He noticed he was having panic attacks almost daily which has slowly over the years decrease in frequency to only situations with very high anxiety.  He reports significant history of depression that seems to have worsened over the past few months.  He states symptoms of depressed mood, anhedonia, hypersomnia (10 to 15 hours of sleep), poor energy, poor concentration, increased appetite (30 pound weight gain in past 2 months), and passive suicidal ideation.  He denies ever having active suicidal ideation besides 1 night after grandmother's death to which she had been thinking about location and method but grandmother came to him in a dream and told him to live which has since resulted in him never having further suicidal ideation.  He endorses significant panic attacks especially in setting of elevated anxiety.  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  He describes panic attacks as episodes of dyspnea, numbness in arms and legs, chest pain, and heat intolerance.  He reports feeling has he has been unable to return to work due to his medical problems as well as  mental health problems.  He states he has been dealing with multiple psychosocial stressors including father being very sick, personal finance problems, lack of transportation, grandmother's death in 67 and mother's passing in 2018.  He has been living between 2 aunts and his brother's house.  He denies history of psychosis.   Denies history of mania.  He denies history of trauma/abuse.  He reports significant alcohol use when he was college but has not drank significantly since then.  He states he drinks either a couple shots of liquor or 2-3 beers once every few months.  He denies any illicit substance use.  He formally smoked cigarettes and vape nicotine but has quit for some time now.  He denies marijuana/cannabis use.   Past Psychiatric History:  Diagnoses: depression and anxiety Medication trials: Lexapro, elavil (worsening pain), xanax, bupropion, celexa, valium, duloxetine (GI distress), hydroxyzine, propranolol, risperidone, ambien, gabapentin (hives) Previous psychiatrist/therapist: [facility], Dr. Flora Lipps (decade ago), therapist in the past  Hospitalizations: denies Suicide attempts: denies SIB: denies Hx of violence towards others: denies Current access to guns: denies Hx of trauma/abuse: denies  Substance Abuse History in the last 12 months:  No.  Past Medical History:  Past Medical History:  Diagnosis Date   Anxiety    Arthritis    "neck" (02/16/2015)   Chronic hepatitis B (HCC)    SECONDARY TO HIV   Chronic lower back pain    Depression    Fibromyalgia    Genital warts    HIV disease (HCC) 02/28/2015   HIV infection (HCC)    followed by Dr. Luciana Axe- sees him every 4 months   Hypertension    IBS (irritable bowel syndrome)    Migraine    "none in years" (02/16/2015   Overweight 07/20/2015   Renal insufficiency 12/29/2013    Past Surgical History:  Procedure Laterality Date   CO2 LASER APPLICATION N/A 02/11/2013   Procedure: CO2 LASER APPLICATION;  Surgeon: Romie Levee, MD;  Location: [facility];  Service: General;  Laterality: N/A;   FOOT SURGERY     HIGH RESOLUTION ANOSCOPY N/A 02/11/2013   Procedure: HIGH RESOLUTION ANOSCOPY WITH BIOPSY, LASER ABLATION;  Surgeon: Romie Levee, MD;  Location: [facility];  Service: General;   Laterality: N/A;   WISDOM TOOTH EXTRACTION      Family Psychiatric History:  denies  Family History:  Family History  Problem Relation Age of Onset   Hypertension Mother    Diabetes Mother    Stroke Mother        cerbral aneurysm   Hypertension Brother    Sleep apnea Cousin    Mental illness Neg Hx     Social History:   Academic/Vocational: Unemployed Social History   Socioeconomic History   Marital status: Single    Spouse name: Not on file   Number of children: Not on file   Years of education: Not on file   Highest education level: Not on file  Occupational History   Not on file  Tobacco Use   Smoking status: Former    Current packs/day: 0.00    Average packs/day: 1 pack/day for 18.0 years (18.0 ttl pk-yrs)    Types: Cigarettes    Start date: 07/03/2003    Quit date: 07/02/2021    Years since quitting: 1.8    Passive exposure: Never   Smokeless tobacco: Never   Tobacco comments:    Pt vapes everyday  Vaping Use   Vaping status:  Every Day   Substances: Nicotine  Substance and Sexual Activity   Alcohol use: Yes    Comment: occ   Drug use: No   Sexual activity: Not on file    Comment: accepted condoms  Other Topics Concern   Not on file  Social History Narrative   Grew up in [place], now living in [place],-  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    Finished HS.   Doing online classes with [employer]- studying accounting   Previously 2 years of classes at [employer].    Has Partner- [name]- together for 1 year.    Has 2 girls- (born in 2000).          Social Determinants of Health   Financial Resource Strain: High Risk (02/05/2023)   Overall Financial Resource Strain (CARDIA)    Difficulty of Paying Living Expenses: Very hard  Food Insecurity: Food Insecurity Present (02/05/2023)   Hunger Vital Sign    Worried About Running Out of Food in the Last Year: Sometimes true    Ran Out of Food in the Last Year: Sometimes true   Transportation Needs: Unmet Transportation Needs (02/05/2023)   PRAPARE - Administrator, Civil Service (Medical): Yes    Lack of Transportation (Non-Medical): Yes  Physical Activity: Sufficiently Active (07/16/2018)   [facility], [facility]   Exercise Vital Sign    Days of Exercise per Week: 4 days    Minutes of Exercise per Session: 150+ min  Stress: Stress Concern Present (03/01/2023)   [employer] of Occupational Health - Occupational Stress Questionnaire    Feeling of Stress : To some extent  Social Connections: Unknown (05/23/2022)   [facility] from [facility], [facility]   Social Network    Social Network: Not on file    Additional Social History: updated  Allergies:   Allergies  Allergen Reactions   Bactrim [Sulfamethoxazole W/Trimethoprim (Co-Trimoxazole)] Hives and Shortness Of Breath   Sulfa Antibiotics Hives and Shortness Of Breath   Cymbalta [Duloxetine Hcl] Diarrhea   Duloxetine Diarrhea   Other Hives and Swelling    [employer] toothpaste    Zoloft [Sertraline Hcl] Diarrhea   Clindamycin/Lincomycin Rash   Neurontin [Gabapentin] Rash    Current Medications: Current Outpatient Medications  Medication Sig Dispense Refill   amLODipine (NORVASC) 10 MG tablet Take 1 tablet (10 mg total) by mouth daily. 90 tablet 1   ARIPiprazole (ABILIFY) 5 MG tablet Take 1 tablet (5 mg total) by mouth daily. 90 tablet 0   bictegravir-emtricitabine-tenofovir AF (BIKTARVY) 50-200-25 MG TABS tablet TAKE 1 TABLET BY MOUTH EVERY DAY 30 tablet 5   diclofenac Sodium (VOLTAREN) 1 % GEL Apply 4 grams topically 4 (four) times daily to affected joint. (Patient not taking: Reported on 04/30/2023) 100 g 11   escitalopram (LEXAPRO) 20 MG tablet Take 1 tablet (20 mg total) by mouth daily. 30 tablet 5   esomeprazole (NEXIUM) 40 MG capsule Take 1 capsule (40 mg total) by mouth daily before breakfast. 90 capsule 0   famotidine  (PEPCID) 40 MG tablet Take 1 tablet (40 mg total) by mouth at bedtime. 90 tablet 0   finasteride (PROSCAR) 5 MG tablet Take 1 tablet (5 mg total) by mouth daily. 30 tablet 1   polyethylene glycol powder (GLYCOLAX/MIRALAX) 17 GM/SCOOP powder Dissolve 17 grams in liquid and drink by mouth every day. 476 g 0   tadalafil (CIALIS) 10 MG tablet Take 0.5 tablets (5  mg total) by mouth daily. 45 tablet 0   No current facility-administered medications for this visit.    ROS: Review of Systems   Objective:  Psychiatric Specialty Exam: There were no vitals taken for this visit.There is no height or weight on file to calculate BMI.  General Appearance: Casual  Eye Contact:  Fair  Speech:  Clear and Coherent and Normal Rate  Volume:  Decreased  Mood:  Anxious  Affect:  Appropriate and Congruent  Thought Content: Logical   Suicidal Thoughts:  No  Homicidal Thoughts:  No  Thought Process:  Coherent, Goal Directed, and Linear  Orientation:  Full (Time, Place, and Person)    Memory: Recent;   Fair  Judgment:  Intact  Insight:  Fair  Concentration:  Attention Span: Fair  Recall:  not formally assessed   Fund of Knowledge: Fair  Language: Fair  Psychomotor Activity:  Normal  Akathisia:  No  AIMS (if indicated): not done  Assets:  Communication Skills Desire for Improvement Financial Resources/Insurance Housing Social Support Transportation  ADL's:  Intact  Cognition: WNL  Sleep:  Good   PE: General: well-appearing; no acute distress  Pulm: no increased work of breathing on room air  Strength & Muscle Tone: within normal limits Neuro: no focal neurological deficits observed  Gait & Station: normal  Metabolic Disorder Labs: Lab Results  Component Value Date   HGBA1C 5.5 10/02/2014   MPG 111 10/02/2014   No results found for: "PROLACTIN" Lab Results  Component Value Date   CHOL 143 10/23/2022   TRIG 127 10/23/2022   HDL 30 (L) 10/23/2022   CHOLHDL 4.8 10/23/2022   VLDL 16  02/14/2015   LDLCALC 91 10/23/2022   LDLCALC 106 (H) 05/14/2022   No results found for: "TSH"  Therapeutic Level Labs: No results found for: "LITHIUM" No results found for: "CBMZ" No results found for: "VALPROATE"  Screenings:  GAD-7    Flowsheet Row Office Visit from 03/05/2023 in [facility] Visit from 02/05/2023 in [facility] [employer]  Total GAD-7 Score 21 21      PHQ2-9    Flowsheet Row Office Visit from 04/01/2023 in [facility] for Infectious Disease Office Visit from 03/05/2023 in [facility] Patient Outreach Telephone from 02/13/2023 in [place] [facility] Office Visit from 02/05/2023 in [facility] Office Visit from 01/17/2023 in [facility] for Infectious Disease  PHQ-2 Total Score 1 6 6 6 1   PHQ-9 Total Score -- 27 22 27  --      Flowsheet Row ED from 12/19/2022 in [facility] ED from 06/02/2022 in [facility] Emergency Department at [facility] ED from 03/26/2022 in [facility] RISK CATEGORY Low Risk No Risk No Risk       Collaboration of Care: Collaboration of Care:   Patient/Guardian was advised Release of Information must be obtained prior to any record release in order to collaborate their care with an outside provider. Patient/Guardian was advised if they have not already done so to contact the registration department to sign all necessary forms in order for [place] to release information regarding their care.   Consent: Patient/Guardian gives verbal consent for treatment and assignment of benefits for services provided during this visit. Patient/Guardian expressed understanding and agreed to proceed.   Park Pope, MD 10/31/20243:59 PM

## 2023-05-03 ENCOUNTER — Ambulatory Visit (HOSPITAL_COMMUNITY): Payer: Medicaid Other | Admitting: Licensed Clinical Social Worker

## 2023-05-03 ENCOUNTER — Encounter (HOSPITAL_COMMUNITY): Payer: Self-pay

## 2023-05-06 ENCOUNTER — Encounter (HOSPITAL_COMMUNITY): Payer: Self-pay | Admitting: Student

## 2023-05-06 ENCOUNTER — Ambulatory Visit (HOSPITAL_BASED_OUTPATIENT_CLINIC_OR_DEPARTMENT_OTHER): Payer: Medicaid Other | Admitting: Student

## 2023-05-06 ENCOUNTER — Other Ambulatory Visit: Payer: Self-pay

## 2023-05-06 VITALS — BP 138/83 | HR 81 | Ht 72.0 in | Wt 238.0 lb

## 2023-05-06 DIAGNOSIS — F411 Generalized anxiety disorder: Secondary | ICD-10-CM

## 2023-05-06 DIAGNOSIS — F330 Major depressive disorder, recurrent, mild: Secondary | ICD-10-CM

## 2023-05-06 DIAGNOSIS — Z79899 Other long term (current) drug therapy: Secondary | ICD-10-CM

## 2023-05-06 DIAGNOSIS — F332 Major depressive disorder, recurrent severe without psychotic features: Secondary | ICD-10-CM | POA: Diagnosis not present

## 2023-05-06 LAB — HELIX MOLECULAR SCREEN: Genetic Analysis Overall Interpretation: NEGATIVE

## 2023-05-06 MED ORDER — PROPRANOLOL HCL 10 MG PO TABS
10.0000 mg | ORAL_TABLET | Freq: Two times a day (BID) | ORAL | 0 refills | Status: DC | PRN
  Filled 2023-05-06: qty 60, 30d supply, fill #0

## 2023-05-06 MED ORDER — BUPROPION HCL ER (XL) 150 MG PO TB24
150.0000 mg | ORAL_TABLET | Freq: Every day | ORAL | 0 refills | Status: DC
  Filled 2023-05-06: qty 30, 30d supply, fill #0

## 2023-05-07 ENCOUNTER — Other Ambulatory Visit (HOSPITAL_COMMUNITY): Payer: Self-pay

## 2023-05-07 ENCOUNTER — Telehealth (HOSPITAL_COMMUNITY): Payer: Self-pay | Admitting: Professional

## 2023-05-07 NOTE — Addendum Note (Signed)
Addended by: Georgian Co E on: 05/07/2023 02:09 PM   Modules accepted: Orders

## 2023-05-08 LAB — VITAMIN D 25 HYDROXY (VIT D DEFICIENCY, FRACTURES): Vit D, 25-Hydroxy: 8.5 ng/mL — ABNORMAL LOW (ref 30.0–100.0)

## 2023-05-08 NOTE — Addendum Note (Signed)
Addended by: Everlena Cooper on: 05/08/2023 09:38 AM   Modules accepted: Level of Service

## 2023-05-09 ENCOUNTER — Other Ambulatory Visit: Payer: Self-pay

## 2023-05-09 ENCOUNTER — Ambulatory Visit (HOSPITAL_COMMUNITY): Payer: Medicaid Other

## 2023-05-09 DIAGNOSIS — F419 Anxiety disorder, unspecified: Secondary | ICD-10-CM

## 2023-05-09 DIAGNOSIS — F332 Major depressive disorder, recurrent severe without psychotic features: Secondary | ICD-10-CM

## 2023-05-09 DIAGNOSIS — R45851 Suicidal ideations: Secondary | ICD-10-CM

## 2023-05-09 NOTE — Psych (Signed)
Virtual Visit via Video Note  I connected with Phillip Gill on 05/09/23 at  1:00 PM EST by a video enabled telemedicine application and verified that I am speaking with the correct person using two identifiers.  Location: Patient: Aunt's Home Provider: Clinical Home Office   I discussed the limitations of evaluation and management by telemedicine and the availability of in person appointments. The patient expressed understanding and agreed to proceed.  Follow Up Instructions:    I discussed the assessment and treatment plan with the patient. The patient was provided an opportunity to ask questions and all were answered. The patient agreed with the plan and demonstrated an understanding of the instructions.   The patient was advised to call back or seek an in-person evaluation if the symptoms worsen or if the condition fails to improve as anticipated.  I provided 75 minutes of non-face-to-face time during this encounter.   [name], [facility]      Comprehensive Clinical Assessment (CCA) Note  05/09/2023 NYSIR FERGUSSON [id number]  Chief Complaint:  Chief Complaint  Patient presents with   Anxiety   Depression   Visit Diagnosis: MDD, Anxiety    CCA Screening, Triage and Referral (STR)  Patient Reported Information How did you hear about [place]? Other (Comment)  Referral name: Dr. Hazle Quant  Referral phone number: No data recorded  Whom do you see for routine medical problems? Primary Care  Practice/Facility Name: [facility]us in [facility]  Practice/Facility Phone Number: No data recorded Name of Contact: No data recorded Contact Number: No data recorded Contact Fax Number: No data recorded Prescriber Name: No data recorded Prescriber Address (if known): No data recorded  What Is the Reason for Your Visit/Call Today? depression, anxiety  How Long Has This Been Causing You Problems? > than 6 months  What Do You Feel Would Help You the Most Today? Treatment for  Depression or other mood problem   Have You Recently Been in Any Inpatient Treatment (Hospital/Detox/Crisis Center/28-Day Program)? No  Name/Location of Program/Hospital:No data recorded How Long Were You There? No data recorded When Were You Discharged? No data recorded  Have You Ever Received Services From [facility] Before? Yes  Who Do You See at [facility]? No data recorded  Have You Recently Had Any Thoughts About Hurting Yourself? No (pSI recently)  Are You Planning to Commit Suicide/Harm Yourself At This time? No   Have you Recently Had Thoughts About Hurting Someone Karolee Ohs? No  Explanation: No data recorded  Have You Used Any Alcohol or Drugs in the Past 24 Hours? No  How Long Ago Did You Use Drugs or Alcohol? No data recorded What Did You Use and How Much? No data recorded  Do You Currently Have a Therapist/Psychiatrist? No data recorded Name of Therapist/Psychiatrist: Dr. Hazle Quant for psychiatry for 1 visit; Fayrene Fearing in [facility]- has not seen yet. First visit 12/2   Have You Been Recently Discharged From Any Office Practice or Programs? No data recorded Explanation of Discharge From Practice/Program: No data recorded    CCA Screening Triage Referral Assessment Type of Contact: Tele-Assessment  Is this Initial or Reassessment? Initial Assessment  Date Telepsych consult ordered in [facility]:  No data recorded Time Telepsych consult ordered in [facility]:  No data recorded  Patient Reported Information Reviewed? No data recorded Patient Left Without Being Seen? No data recorded Reason for Not Completing Assessment: No data recorded  Collateral Involvement: chart review   Does Patient Have a Court Appointed Legal Guardian? No  data recorded Name and Contact of Legal Guardian: No data recorded If Minor and Not Living with Parent(s), Who has Custody? No data recorded Is CPS involved or ever been involved? Never  Is APS involved or ever been involved? Never   Patient Determined To  Be At Risk for Harm To Self or Others Based on Review of Patient Reported Information or Presenting Complaint? No data recorded Method: No data recorded Availability of Means: No data recorded Intent: No data recorded Notification Required: No data recorded Additional Information for Danger to Others Potential: No data recorded Additional Comments for Danger to Others Potential: No data recorded Are There Guns or Other Weapons in Your Home? No  Types of Guns/Weapons: No data recorded Are These Weapons Safely Secured?                            No data recorded Who Could Verify You Are Able To Have These Secured: No data recorded Do You Have any Outstanding Charges, Pending Court Dates, Parole/Probation? No data recorded Contacted To Inform of Risk of Harm To Self or Others: No data recorded  Location of Assessment: Other (comment)   Does Patient Present under Involuntary Commitment? No data recorded IVC Papers Initial File Date: No data recorded  [place] of Residence: [place]   Patient Currently Receiving the Following Services: Medication Management   Determination of Need: Routine (7 days)   Options For Referral: Partial Hospitalization     CCA Biopsychosocial Intake/Chief Complaint:  Phillip Gill was referred by his new individual psychiatrist. He reports stressors include: 1) Housing: Phillip Gill reports he goes back ad forth between his Aunt's and Brother's houses. 2) Finances: He is unable to afford his own place or car. 3) Family: "A lot drama." Phillip Gill declines to elaborate. 4) Work: He reports "I can't stay focused long enough to work. My mind is always racing." 5) Grief: Phillip Gill reports he is still grieving the death of his Mom in 05-16-2007 and [place] in 05-15-97. He reports treatment history of sporadic counseling and medication management. He saw Dr. Hazle Quant 3 days ago for psychiatry. He is scheduled to see Fayrene Fearing for the first time at [facility] on 12/2 for therapy. He denies hospitalizations or  attempts, though he reports in 15-May-1998 he started to think of ways and locations he could attempt. He denies current SI/HI/AVH/NSSIB/Weapons. He endorses pSI multiple times a week. He reports he has cousins with mental health issues but is unsure what the dx are. He identifies himself as his only support. He moves between his aunt's house and brother's house. He does not have his own housing at this time. Medical diagnoses include: High Blood pressure; Sleep Apnea; Hep B; HIV. Reports his MH diagnoses include depression, anxiety, panic.  Current Symptoms/Problems: pSI; racing thoughts; increased depression; increased anxiety; panic; increased appetite; increased sleep; increased isolation; irritable; increased frustration; "I have to fight myself just to do something." Lacks motivation; lacks energy; ADLs: decreased- hygiene, cooking, cleaning; anhedonia; feelings of hopelessness/worthlessness; some memory problems- ex: "I forget if I took my medicine or not."; increased tearfulness   Patient Reported Schizophrenia/Schizoaffective Diagnosis in Past: No   Strengths: motivation for treatment  Preferences: to feel better  Abilities: can attend and participate in treatment   Type of Services Patient Feels are Needed: PHP; individual   Initial Clinical Notes/Concerns: No data recorded  Mental Health Symptoms Depression:   Change in energy/activity; Sleep (too much or little); Tearfulness; Difficulty Concentrating; Fatigue;  Weight gain/loss; Worthlessness; Hopelessness; Increase/decrease in appetite; Irritability   Duration of Depressive symptoms:  Greater than two weeks   Mania:   None   Anxiety:    Difficulty concentrating; Fatigue; Irritability; Sleep; Tension; Worrying   Psychosis:   None   Duration of Psychotic symptoms: No data recorded  Trauma:  No data recorded  Obsessions:   None   Compulsions:   None   Inattention:  No data recorded  Hyperactivity/Impulsivity:  No data  recorded  Oppositional/Defiant Behaviors:   None   Emotional Irregularity:  No data recorded  Other Mood/Personality Symptoms:  No data recorded   Mental Status Exam Appearance and self-care  Stature:   Average   Weight:   Average weight   Clothing:   Casual   Grooming:   Normal   Cosmetic use:   None   Posture/gait:   Rigid; Normal   Motor activity:   Not Remarkable   Sensorium  Attention:   Normal   Concentration:   Normal   Orientation:   X5   Recall/memory:   Normal   Affect and Mood  Affect:   Flat; Depressed   Mood:   Depressed   Relating  Eye contact:   Fleeting   Facial expression:   Depressed   Attitude toward examiner:   Cooperative; Guarded   Thought and Language  Speech flow:  Normal   Thought content:   Appropriate to Mood and Circumstances   Preoccupation:   Ruminations   Hallucinations:   None   Organization:  No data recorded  [employer] of Knowledge:   Average   Intelligence:   Average   Abstraction:   Normal   Judgement:   Fair   Reality Testing:   Adequate   Insight:   Fair; Gaps   Decision Making:   Only simple   Social Functioning  Social Maturity:   Isolates   Social Judgement:   Normal   Stress  Stressors:   Family conflict; Grief/losses; Housing; Illness; Financial; Transitions   Coping Ability:   Exhausted   Skill Deficits:   Activities of daily living; Decision making; Self-care   Supports:   Support needed     Religion: Religion/Spirituality Are You A Religious Person?: Yes What is Your Religious Affiliation?: Environmental consultant: Leisure / Recreation Do You Have Hobbies?: Yes Leisure and Hobbies: watch TV  Exercise/Diet: Exercise/Diet Do You Exercise?: No Have You Gained or Lost A Significant Amount of Weight in the Past Six Months?: Yes-Gained Number of Pounds Gained: 30 Do You Follow a Special Diet?: No Do You Have Any Trouble  Sleeping?: No   CCA Employment/Education Employment/Work Situation: Employment / Work Situation Employment Situation: Unemployed Has Patient ever Been in Equities trader?: No  Education: Education Is Patient Currently Attending School?: No Did Garment/textile technologist From [employer]?: Yes Did Theme park manager?: Yes Did Designer, television/film set?: No Did You Have An Individualized Education Program (IIEP): No Did You Have Any Difficulty At [employer]?: No Patient's Education Has Been Impacted by Current Illness: No   CCA Family/Childhood History Family and Relationship History: Family history Marital status: Single Are you sexually active?: Yes What is your sexual orientation?: gay Has your sexual activity been affected by drugs, alcohol, medication, or emotional stress?: emotional stress- decreased Does patient have children?: Yes How many children?: 2 How is patient's relationship with their children?: 24- "very poor- I haven't been able to be a part of their childhood life. Their mother  always had other plans for them when I tried to spend time with them."  Childhood History:  Childhood History By whom was/is the patient raised?: Mother Additional childhood history information: Raised by mother. "Even though I was around a lot of my cousins and brother, I was still to myself. I was the runt of the group." Description of patient's relationship with caregiver when they were a child: Rocky Patient's description of current relationship with people who raised him/her: deceased How were you disciplined when you got in trouble as a child/adolescent?: spankings Does patient have siblings?: Yes Number of Siblings: 1 Description of patient's current relationship with siblings: brother: OK "a lot better than it was as a kid."; he reports he has some siblings on his Dad's side but they don't have much of a relationship Did patient suffer any verbal/emotional/physical/sexual abuse as a child?:  No Did patient suffer from severe childhood neglect?: No Has patient ever been sexually abused/assaulted/raped as an adolescent or adult?: No Was the patient ever a victim of a crime or a disaster?: No Witnessed domestic violence?: No Has patient been affected by domestic violence as an adult?: No  Child/Adolescent Assessment:     CCA Substance Use Alcohol/Drug Use: Alcohol / Drug Use Pain Medications: denies Prescriptions: see MAR Over the Counter: denies History of alcohol / drug use?: No history of alcohol / drug abuse                         ASAM's:  Six Dimensions of Multidimensional Assessment  Dimension 1:  Acute Intoxication and/or Withdrawal Potential:      Dimension 2:  Biomedical Conditions and Complications:      Dimension 3:  Emotional, Behavioral, or Cognitive Conditions and Complications:     Dimension 4:  Readiness to Change:     Dimension 5:  Relapse, Continued use, or Continued Problem Potential:     Dimension 6:  Recovery/Living Environment:     ASAM Severity Score:    ASAM Recommended Level of Treatment:     Substance use Disorder (SUD)    Recommendations for Services/Supports/Treatments: Recommendations for Services/Supports/Treatments Recommendations For Services/Supports/Treatments: Partial Hospitalization  DSM5 Diagnoses: Patient Active Problem List   Diagnosis Date Noted   Nocturia 04/30/2023   Neck mass 04/01/2023   MDD (major depressive disorder), recurrent severe, without psychosis (HCC) 03/05/2023   Gastroesophageal reflux disease 03/05/2023   Passive suicidal ideations 02/05/2023   OSA on CPAP 02/05/2023   MDD (major depressive disorder) 12/19/2022   Urinary frequency 12/18/2022   Left foot pain 12/18/2022   Poor social situation 12/18/2022   Snoring 01/22/2022   Encounter for physical examination related to employment 09/26/2021   Human monkeypox 04/06/2021   Cervical pain 02/07/2021   Erectile dysfunction 08/29/2020    Healthcare maintenance 04/05/2020   Foot lesion 12/24/2019   Human immunodeficiency virus (HIV) disease (HCC) 08/25/2019   Gonorrhea 02/11/2019   Hepatitis B surface antigen positive 09/05/2018   Overweight 07/20/2015   Colitis 03/08/2015   HIV (human immunodeficiency virus infection) (HCC)    Tobacco abuse    LGV (lymphogranuloma venereum)    Primary syphilis    Chronic hepatitis B (HCC) 10/03/2014   HTN (hypertension)    Gynecomastia 04/30/2014   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   Chronic pain associated with significant psychosocial dysfunction 09/18/2012   Fibromyalgia muscle pain 02/11/2012   Depression 12/07/2011   Other constipation 07/16/2011   Anxiety 02/02/2011    Patient Centered Plan: Patient  is on the following Treatment Plan(s):  Depression   Referrals to Alternative Service(s): Referred to Alternative Service(s):   Place:   Date:   Time:    Referred to Alternative Service(s):   Place:   Date:   Time:    Referred to Alternative Service(s):   Place:   Date:   Time:    Referred to Alternative Service(s):   Place:   Date:   Time:      Collaboration of Care: Psychiatrist AEB referral for PHP  Patient/Guardian was advised Release of Information must be obtained prior to any record release in order to collaborate their care with an outside provider. Patient/Guardian was advised if they have not already done so to contact the registration department to sign all necessary forms in order for [place] to release information regarding their care.   Consent: Patient/Guardian gives verbal consent for treatment and assignment of benefits for services provided during this visit. Patient/Guardian expressed understanding and agreed to proceed.   [name], [facility]

## 2023-05-09 NOTE — Progress Notes (Signed)
Specialty Pharmacy Refill Coordination Note  Phillip Gill is a 47 y.o. male contacted today regarding refills of specialty medication(s) Bictegravir-Emtricitab-Tenofov   Patient requested Delivery   Delivery date: 05/20/23   Verified address: [address] [place], [address]   Medication will be filled on 05/19/23.

## 2023-05-10 ENCOUNTER — Other Ambulatory Visit (HOSPITAL_COMMUNITY): Payer: Self-pay

## 2023-05-10 ENCOUNTER — Ambulatory Visit: Payer: Medicaid Other | Admitting: Physician Assistant

## 2023-05-14 ENCOUNTER — Ambulatory Visit (HOSPITAL_COMMUNITY): Payer: Medicaid Other

## 2023-05-14 ENCOUNTER — Encounter (HOSPITAL_COMMUNITY): Payer: Self-pay

## 2023-05-14 ENCOUNTER — Telehealth (HOSPITAL_COMMUNITY): Payer: Self-pay | Admitting: Licensed Clinical Social Worker

## 2023-05-14 ENCOUNTER — Other Ambulatory Visit: Payer: Self-pay

## 2023-05-15 ENCOUNTER — Ambulatory Visit (INDEPENDENT_AMBULATORY_CARE_PROVIDER_SITE_OTHER): Payer: Medicaid Other | Admitting: Licensed Clinical Social Worker

## 2023-05-15 DIAGNOSIS — F332 Major depressive disorder, recurrent severe without psychotic features: Secondary | ICD-10-CM

## 2023-05-15 DIAGNOSIS — R45851 Suicidal ideations: Secondary | ICD-10-CM

## 2023-05-15 DIAGNOSIS — F419 Anxiety disorder, unspecified: Secondary | ICD-10-CM

## 2023-05-15 NOTE — Progress Notes (Signed)
Psychiatric Initial Adult Assessment   Virtual Visit via Video Note   I connected with Phillip Gill on 05/15/2023,  9:00 AM EST There are other unrelated non-urgent complaints, but due to the busy schedule and the amount of time I've already spent with him, time does not permit me to address these routine issues at today's visit. I've requested another appointment to review these additional issues. by a video enabled telemedicine application and verified that I am speaking with the correct person using two identifiers.   Location: Patient: Home Provider: Clinic   I discussed the limitations of evaluation and management by telemedicine and the availability of in person appointments. The patient expressed understanding and agreed to proceed.   Follow Up Instructions:   I discussed the assessment and treatment plan with the patient. The patient was provided an opportunity to ask questions and all were answered. The patient agreed with the plan and demonstrated an understanding of the instructions.   The patient was advised to call back or seek an in-person evaluation if the symptoms worsen or if the condition fails to improve as anticipated.   Lamar Sprinkles, MD Psych Resident, PGY-3  Patient Identification: Phillip Gill MRN:  [id number] Date of Evaluation:  05/15/2023 Referral Source: Dr. Park Pope, MD Chief Complaint:   Chief Complaint  Patient presents with   Establish Care    PHP    Visit Diagnosis:    ICD-10-CM   1. Severe episode of recurrent major depressive disorder, without psychotic features (HCC)  F33.2     2. Passive suicidal ideations  R45.851     3. Anxiety  F41.9       History of Present Illness:  Phillip Gill is a 47 y.o. male with a psychiatric history of MDD, GAD, panic attacks, and a medical history of HIV on HAART, OSA with sporadic CPAP use, chronic hepatitis B, anal condyloma, GERD, hypertension  who presents at the referral of outpatient  psychiatrist, Dr. Hazle Quant for worsening depression in the setting of increased psychosocial stressors.   Patient reports that he is coping with familial deaths (mom in 2018 and grandmother in 33). He feels as though he is losing everything he worked hard to get, including his housing and transportation. He finds no meaning in life, but denies active SI.  He does wish that he could go to sleep and not wake up.  He otherwise denies HI  He denies substantial support, although he does have some support in his aunts and brother.  He goes on to mention that his aunts stepped up when his mom passed away.  He has also been between housing living with his aunts and brother.  He reports anhedonia, low mood, hypersomnolence, and hyperphagia with increased appetite. He sleeps off and on, all throughout the day. He started CPAP recently; it is frustrating as the mask shifts while sleeping. Not yet feeling the benefits, as he is only wearing about 4 hours per night. He has felt down and depressed for years. Last worked in Jan 2024. His mood worsened then,as he did not want to be there and had difficult focusing. He was more stressed then.   He sometimes wakes up with racing thoughts, which is eased with some sounds. He has nightmares, but random dreams.   He has not yet taken the propranolol. No differences noted since starting Wellbutrin. Sleep is not less, nor has energy level improved.   He denies history of psychosis.  Denies history of mania.  He denies history of trauma/abuse.  He reports significant alcohol use when he was college but has not drank significantly since then.  He states he drinks either a couple shots of liquor or 2-3 beers once every few months.  He denies any illicit substance use.  He formally smoked cigarettes and vape nicotine but has quit for some time now.  He denies marijuana/cannabis use.    Associated Signs/Symptoms: Depression Symptoms:  depressed  mood, anhedonia, hypersomnia, psychomotor retardation, fatigue, feelings of worthlessness/guilt, difficulty concentrating, increased appetite, (Hypo) Manic Symptoms:   Denies Anxiety Symptoms:   Difficulty shutting off thoughts Psychotic Symptoms:   Denies PTSD Symptoms: NA  Past Psychiatric History:  Diagnoses: depression and anxiety Medication trials: Lexapro, elavil (worsening pain), xanax, bupropion, celexa, valium, duloxetine (GI distress), hydroxyzine, propranolol, risperidone, ambien, gabapentin (hives) Previous psychiatrist/therapist: [facility], Dr. Flora Lipps (decade ago), Will begin therapy with [facility] next month.  Hospitalizations: denies Suicide attempts: denies SIB: denies Hx of violence towards others: denies Current access to guns: denies Hx of trauma/abuse: denies  Substance Use History: EtOH:  reports current alcohol use. Nicotine:  reports that he quit smoking about 22 months ago. His smoking use included cigarettes. He started smoking about 19 years ago. He has a 18 pack-year smoking history. He has never been exposed to tobacco smoke. He has never used smokeless tobacco. Marijuana: Denies IV drug use: Denies Stimulants: Denies Opiates: Denies Sedative/hypnotics: Denies Hallucinogens: Denies DT: Denies Detox: Denies Residential: Denies  Past Medical History: Dx:  has a past medical history of Anxiety, Arthritis, Chronic hepatitis B (HCC), Chronic lower back pain, Depression, Fibromyalgia, Genital warts, HIV disease (HCC) (02/28/2015), HIV infection (HCC), Hypertension, IBS (irritable bowel syndrome), Migraine, Overweight (07/20/2015), and Renal insufficiency (12/29/2013).  Head trauma: Denies Seizures: Denies Allergies: Bactrim [sulfamethoxazole w/trimethoprim (co-trimoxazole)], Sulfa antibiotics, Cymbalta [duloxetine hcl], Duloxetine, Other, Zoloft [sertraline hcl], Clindamycin/lincomycin, and Neurontin [gabapentin]   Family Psychiatric  History:  Suicide: Paternal cousin Substance use: Both sides, alcohol use Others: Maternal cousin with unspecified mental health disorder  Social History:  Housing: Living between 2 aunts' and brother's homes. Income: Unemployed  Previous Psychotropic Medications: Yes   Substance Abuse History in the last 12 months:  No.  Consequences of Substance Abuse: Negative  Past Medical History:  Past Medical History:  Diagnosis Date   Anxiety    Arthritis    "neck" (02/16/2015)   Chronic hepatitis B (HCC)    SECONDARY TO HIV   Chronic lower back pain    Depression    Fibromyalgia    Genital warts    HIV disease (HCC) 02/28/2015   HIV infection (HCC)    followed by Dr. Luciana Axe- sees him every 4 months   Hypertension    IBS (irritable bowel syndrome)    Migraine    "none in years" (02/16/2015   Overweight 07/20/2015   Renal insufficiency 12/29/2013    Past Surgical History:  Procedure Laterality Date   CO2 LASER APPLICATION N/A 02/11/2013   Procedure: CO2 LASER APPLICATION;  Surgeon: Romie Levee, MD;  Location: [facility];  Service: General;  Laterality: N/A;   FOOT SURGERY     HIGH RESOLUTION ANOSCOPY N/A 02/11/2013   Procedure: HIGH RESOLUTION ANOSCOPY WITH BIOPSY, LASER ABLATION;  Surgeon: Romie Levee, MD;  Location: [facility];  Service: General;  Laterality: N/A;   WISDOM TOOTH EXTRACTION      Family History:  Family History  Problem Relation Age of Onset   Hypertension Mother  Diabetes Mother    Stroke Mother        cerbral aneurysm   Hypertension Brother    Sleep apnea Cousin    Mental illness Neg Hx     Social History:   Social History   Socioeconomic History   Marital status: Single    Spouse name: Not on file   Number of children: Not on file   Years of education: Not on file   Highest education level: Not on file  Occupational History   Not on file  Tobacco Use   Smoking status: Former    Current packs/day: 0.00     Average packs/day: 1 pack/day for 18.0 years (18.0 ttl pk-yrs)    Types: Cigarettes    Start date: 07/03/2003    Quit date: 07/02/2021    Years since quitting: 1.8    Passive exposure: Never   Smokeless tobacco: Never   Tobacco comments:    Pt vapes everyday  Vaping Use   Vaping status: Every Day   Substances: Nicotine  Substance and Sexual Activity   Alcohol use: Yes    Comment: occ   Drug use: No   Sexual activity: Not on file    Comment: accepted condoms  Other Topics Concern   Not on file  Social History Narrative   Grew up in [place], now living in [place],-  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    Finished HS.   Doing online classes with [employer]- studying accounting   Previously 2 years of classes at [employer].    Has Partner- [name]- together for 1 year.    Has 2 girls- (born in 2000).          Social Determinants of Health   Financial Resource Strain: High Risk (02/05/2023)   Overall Financial Resource Strain (CARDIA)    Difficulty of Paying Living Expenses: Very hard  Food Insecurity: Food Insecurity Present (02/05/2023)   Hunger Vital Sign    Worried About Running Out of Food in the Last Year: Sometimes true    Ran Out of Food in the Last Year: Sometimes true  Transportation Needs: Unmet Transportation Needs (02/05/2023)   PRAPARE - Administrator, Civil Service (Medical): Yes    Lack of Transportation (Non-Medical): Yes  Physical Activity: Sufficiently Active (07/16/2018)   [facility]   Exercise Vital Sign    Days of Exercise per Week: 4 days    Minutes of Exercise per Session: 150+ min  Stress: Stress Concern Present (03/01/2023)   [employer] of Occupational Health - Occupational Stress Questionnaire    Feeling of Stress : To some extent  Social Connections: Unknown (05/23/2022)   [facility] from [facility]   Social Network    Social  Network: Not on file   Allergies:   Allergies  Allergen Reactions   Bactrim [Sulfamethoxazole W/Trimethoprim (Co-Trimoxazole)] Hives and Shortness Of Breath   Sulfa Antibiotics Hives and Shortness Of Breath   Cymbalta [Duloxetine Hcl] Diarrhea   Duloxetine Diarrhea   Other Hives and Swelling    Colgate toothpaste    Zoloft [Sertraline Hcl] Diarrhea   Clindamycin/Lincomycin Rash   Neurontin [Gabapentin] Rash    Metabolic Disorder Labs: Lab Results  Component Value Date   HGBA1C 5.5 10/02/2014   MPG 111 10/02/2014   No results found for: "PROLACTIN" Lab Results  Component Value Date   CHOL 143 10/23/2022   TRIG 127 10/23/2022  HDL 30 (L) 10/23/2022   CHOLHDL 4.8 10/23/2022   VLDL 16 02/14/2015   LDLCALC 91 10/23/2022   LDLCALC 106 (H) 05/14/2022   No results found for: "TSH"  Therapeutic Level Labs: No results found for: "LITHIUM" No results found for: "CBMZ" No results found for: "VALPROATE"  Current Medications: Current Outpatient Medications  Medication Sig Dispense Refill   amLODipine (NORVASC) 10 MG tablet Take 1 tablet (10 mg total) by mouth daily. 90 tablet 1   ARIPiprazole (ABILIFY) 5 MG tablet Take 1 tablet (5 mg total) by mouth daily. 90 tablet 0   bictegravir-emtricitabine-tenofovir AF (BIKTARVY) 50-200-25 MG TABS tablet TAKE 1 TABLET BY MOUTH EVERY DAY 30 tablet 5   buPROPion (WELLBUTRIN XL) 150 MG 24 hr tablet Take 1 tablet (150 mg total) by mouth daily. 30 tablet 0   escitalopram (LEXAPRO) 20 MG tablet Take 1 tablet (20 mg total) by mouth daily. 30 tablet 5   esomeprazole (NEXIUM) 40 MG capsule Take 1 capsule (40 mg total) by mouth daily before breakfast. 90 capsule 0   famotidine (PEPCID) 40 MG tablet Take 1 tablet (40 mg total) by mouth at bedtime. 90 tablet 0   finasteride (PROSCAR) 5 MG tablet Take 1 tablet (5 mg total) by mouth daily. 30 tablet 1   propranolol (INDERAL) 10 MG tablet Take 1 tablet (10 mg total) by mouth 2 (two) times daily as  needed. 60 tablet 0   diclofenac Sodium (VOLTAREN) 1 % GEL Apply 4 grams topically 4 (four) times daily to affected joint. (Patient not taking: Reported on 04/30/2023) 100 g 11   polyethylene glycol powder (GLYCOLAX/MIRALAX) 17 GM/SCOOP powder Dissolve 17 grams in liquid and drink by mouth every day. 476 g 0   tadalafil (CIALIS) 10 MG tablet Take 0.5 tablets (5 mg total) by mouth daily. 45 tablet 0   No current facility-administered medications for this visit.   VIRTUAL VISIT, LIMITED ASSESSMENT Musculoskeletal: Strength & Muscle Tone: unable to assess Gait & Station: unable to assess Patient leans: unable to assess  Psychiatric Specialty Exam: General Appearance: Casual, faily groomed  Eye Contact:  Good    Speech:  Clear, coherent, normal rate   Volume:  Normal   Mood:  "I have been feeling depressed and do not see meaning in life"  Affect: Congruent, depressed  Thought Content: Logical  Suicidal Thoughts: Denied active SI, endorses passive SI    Thought Process:  Coherent, goal-directed, linear   Orientation:  A&Ox4   Memory:  Immediate good  Judgment:  Fair   Insight:  Shallow   Concentration:  Attention and concentration good   Recall:  Good  Fund of Knowledge: Fair  Language: Good, fluent  Psychomotor Activity: grossly appears normal  Akathisia:  NA   AIMS (if indicated): NA   Assets:  Desire for Improvement Housing Leisure Time Resilience Social Support  ADL's:  Intact  Cognition: WNL  Sleep: Hypersomniac    Screenings: GAD-7    Loss adjuster, chartered Office Visit from 03/05/2023 in [facility] [employer] Visit from 02/05/2023 in [facility] [employer]  Total GAD-7 Score 21 21      PHQ2-9    Flowsheet Row Counselor from 05/09/2023 in [facility] Office Visit from 04/01/2023 in [facility] [place] Office Visit from 03/05/2023 in  [facility] Patient Outreach Telephone from 02/13/2023 in [facility] Office Visit from 02/05/2023 in [facility] Total Score 6 1 6 6 6   PHQ-9 Total Score 25 -- 27 22 27       Flowsheet Row Counselor from 05/09/2023 in [facility] ED from 12/19/2022 in [facility] ED from 06/02/2022 in [facility] Emergency Department at [facility]  C-SSRS RISK CATEGORY Moderate Risk Low Risk No Risk       Assessment and Plan: Patient is admitted to [facility] at the recommendation of outpatient psychiatrist for additional support in managing depressive symptoms.  Patient had recent changes to medications, but has not noticed benefit of them just yet.  Additionally, suspect that OSA may be contributing to his fatigue, hypersomnolence, and mood.  He has CPAP but has not been fully compliant with it, as the mask shifts in his sleep and he takes it off after about 4 hours.  Advised improved compliance with CPAP.  Will not make medication adjustments at this time.  #MDD #Anxiety -Continue medications per outpatient provider: Abilify 5 mg Wellbutrin 150 mg daily Lexapro 20 mg daily Propranolol 10 mg twice daily as needed for anxiety   Collaboration of Care: Case was staffed with attending, see attestation per above.   Patient/Guardian was advised Release of Information must be obtained prior to any record release in order to collaborate their care with an outside provider. Patient/Guardian was advised if they have not already done so to contact the registration department to sign all necessary forms in order for [place] to release information regarding their care.   Consent: Patient/Guardian gives verbal consent for treatment and assignment of benefits for services provided during this visit. Patient/Guardian expressed understanding and agreed to  proceed.   Lamar Sprinkles, MD

## 2023-05-16 ENCOUNTER — Other Ambulatory Visit (HOSPITAL_COMMUNITY): Payer: Self-pay

## 2023-05-16 ENCOUNTER — Encounter: Payer: Self-pay | Admitting: Internal Medicine

## 2023-05-16 ENCOUNTER — Encounter (HOSPITAL_COMMUNITY): Payer: Self-pay

## 2023-05-16 ENCOUNTER — Ambulatory Visit: Payer: Medicaid Other | Admitting: Internal Medicine

## 2023-05-16 ENCOUNTER — Ambulatory Visit (HOSPITAL_COMMUNITY): Payer: Medicaid Other

## 2023-05-16 ENCOUNTER — Other Ambulatory Visit: Payer: Self-pay

## 2023-05-16 VITALS — BP 132/86 | HR 88 | Ht 72.0 in | Wt 241.0 lb

## 2023-05-16 DIAGNOSIS — R1310 Dysphagia, unspecified: Secondary | ICD-10-CM

## 2023-05-16 DIAGNOSIS — Z1211 Encounter for screening for malignant neoplasm of colon: Secondary | ICD-10-CM | POA: Diagnosis not present

## 2023-05-16 DIAGNOSIS — A630 Anogenital (venereal) warts: Secondary | ICD-10-CM

## 2023-05-16 DIAGNOSIS — K5909 Other constipation: Secondary | ICD-10-CM | POA: Diagnosis not present

## 2023-05-16 DIAGNOSIS — K219 Gastro-esophageal reflux disease without esophagitis: Secondary | ICD-10-CM

## 2023-05-16 MED ORDER — ESOMEPRAZOLE MAGNESIUM 40 MG PO CPDR
40.0000 mg | DELAYED_RELEASE_CAPSULE | Freq: Every day | ORAL | 0 refills | Status: DC
  Filled 2023-05-16 – 2023-06-09 (×5): qty 90, 90d supply, fill #0

## 2023-05-16 MED ORDER — PLENVU 140 G PO SOLR
1.0000 | Freq: Once | ORAL | 0 refills | Status: AC
  Filled 2023-05-16: qty 3, 7d supply, fill #0

## 2023-05-16 NOTE — Progress Notes (Signed)
HISTORY OF PRESENT ILLNESS:  Phillip Gill is a 47 y.o. male with multiple medical problems including HIV disease, hepatitis B, hypertension, fibromyalgia, depression, and obesity who was sent today by his primary care provider regarding GERD, constipation, and the need for colonoscopy.  He is followed by ID regarding his HIV disease and hepatitis.  Currently on Biktarvy.  First, the patient states that he has had a 78-month history of constant burning.  Regurgitation at night with saliva on his pillow.  No vomiting.  He was placed on Nexium 40 mg daily.  This helped, but incompletely.  Was told to take Pepcid at night.  He does have some vague pill dysphagia but otherwise swallows well.  No issues with solids or liquids.  No prior GI evaluations.  Patient does have issues with constipation.  MiraLAX has been recommended.  He states that since starting Nexium his bowels have been regular.  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  No prior colon cancer screening.  He does tell me that his father had recent been diagnosed with colon cancer around age 62.  Review of blood work from September 2024 shows unremarkable comprehensive metabolic panel except for glucose of 148.  CBC from November 2023 shows a normal hemoglobin of 13.7.  Abdominal ultrasound from September 2023 was unremarkable.  CT scan from June 2021 revealed some pericolonic edema or thickening in the rectosigmoid region consistent with infectious colitis.  REVIEW OF SYSTEMS:  All non-GI ROS negative unless otherwise stated in the HPI except for arthritis, back pain  Past Medical History:  Diagnosis Date   Anxiety    Arthritis    "neck" (02/16/2015)   Chronic hepatitis B (HCC)    SECONDARY TO HIV   Chronic lower back pain    Depression    Fibromyalgia    Genital warts    HIV disease (HCC) 02/28/2015   HIV infection (HCC)    followed by Dr. Luciana Gill-  sees him every 4 months   Hypertension    IBS (irritable bowel syndrome)    Migraine    "none in years" (02/16/2015   Overweight 07/20/2015   Renal insufficiency 12/29/2013    Past Surgical History:  Procedure Laterality Date   CO2 LASER APPLICATION N/A 02/11/2013   Procedure: CO2 LASER APPLICATION;  Surgeon: Romie Levee, MD;  Location[facility];  Service: General;  Laterality: N/A;   FOOT SURGERY     HIGH RESOLUTION ANOSCOPY N/A 02/11/2013   Procedure: HIGH RESOLUTION ANOSCOPY WITH BIOPSY, LASER ABLATION;  Surgeon: Romie Levee, MD;  Location[facility];  Service: General;  Laterality: N/A;   WISDOM TOOTH EXTRACTION      Social History ARLINE PERSON  reports that he quit smoking about 22 months ago. His smoking use included cigarettes. He started smoking about 19 years ago. He has a 18 pack-year smoking history. He has never been exposed to tobacco smoke. He has never used smokeless tobacco. He reports current alcohol use. He reports that he does not use drugs.  family history includes Colon cancer in his father; Diabetes in his mother; Hypertension in his brother and mother; Pancreatic cancer in his mother; Sleep apnea in his cousin; Stroke in his mother.  Allergies  Allergen Reactions   Bactrim [Sulfamethoxazole W/Trimethoprim (Co-Trimoxazole)] Hives and Shortness Of Breath   Sulfa Antibiotics Hives and Shortness Of Breath   Cymbalta [Duloxetine Hcl] Diarrhea  Duloxetine Diarrhea   Other Hives and Swelling    Colgate toothpaste    Zoloft [Sertraline Hcl] Diarrhea   Clindamycin/Lincomycin Rash   Neurontin [Gabapentin] Rash       PHYSICAL EXAMINATION: Vital signs: BP 132/86 (BP Location: Right Arm, Patient Position: Sitting, Cuff Size: Large)   Pulse 88   Ht 6' (1.829 m)   Wt 241 lb (109.3 kg)   SpO2 96%   BMI 32.69 kg/m   Constitutional: generally well-appearing, no acute distress Psychiatric: alert and oriented x3,  cooperative Eyes: extraocular movements intact, anicteric, conjunctiva pink Mouth: oral pharynx moist, no lesions Neck: supple no lymphadenopathy Cardiovascular: heart regular rate and rhythm, no murmur Lungs: clear to auscultation bilaterally Abdomen: soft, nontender, nondistended, no obvious ascites, no peritoneal signs, normal bowel sounds, no organomegaly Rectal: Deferred until colonoscopy Extremities: no clubbing, cyanosis, or lower extremity edema bilaterally Skin: no lesions on visible extremities Neuro: No focal deficits.  Cranial nerves intact  ASSESSMENT:  1.  Chronic GERD.  Incomplete relief with once daily Nexium. 2.  Vague pill dysphagia. 3.  Intermittent problems with constipation.  Improved recently 4.  Family history of colon cancer in father age 79 5.  History of anal warts 6.  Multiple significant medical problems including HIV disease controlled on Biktarvy   PLAN:  1.  Reflux precautions 2.  Increase Nexium to 40 mg twice daily.  Prescribed 3.  Schedule upper endoscopy to evaluate chronic GERD despite PPI and dysphagia. 4.  Schedule colonoscopy for colon cancer screening in a 47 year old with a family history of colon cancer and complaints of constipation 5.  Ongoing general medical care with PCP 6.  May need surgical referral if recurrent condyloma noted. Total time of 60 minutes was spent preparing to see the patient, obtaining comprehensive history, performing medically appropriate physical examination, counseling and educating the patient regarding the above listed issues, ordering medication, ordering multiple endoscopic procedures, and documenting clinical information in the health record

## 2023-05-16 NOTE — Patient Instructions (Addendum)
We have sent the following medications to your pharmacy for you to pick up at your convenience:  Nexium - twice a day.  You have been scheduled for an endoscopy and colonoscopy. Please follow the written instructions given to you at your visit today.  Please pick up your prep supplies at the pharmacy within the next 1-3 days.  If you use inhalers (even only as needed), please bring them with you on the day of your procedure.  DO NOT TAKE 7 DAYS PRIOR TO TEST- Trulicity (dulaglutide) Ozempic, Wegovy (semaglutide) Mounjaro (tirzepatide) Bydureon Bcise (exanatide extended release)  DO NOT TAKE 1 DAY PRIOR TO YOUR TEST Rybelsus (semaglutide) Adlyxin (lixisenatide) Victoza (liraglutide) Byetta (exanatide) ___________________________________________________________________________ _______________________________________________________  If your blood pressure at your visit was 140/90 or greater, please contact your primary care physician to follow up on this.  _______________________________________________________  If you are age 5 or older, your body mass index should be between 23-30. Your Body mass index is 32.69 kg/m. If this is out of the aforementioned range listed, please consider follow up with your Primary Care Provider.  If you are age 63 or younger, your body mass index should be between 19-25. Your Body mass index is 32.69 kg/m. If this is out of the aformentioned range listed, please consider follow up with your Primary Care Provider.   ________________________________________________________  The [facility] providers would like to encourage you to use [facility] to communicate with providers for non-urgent requests or questions.  Due to long hold times on the telephone, sending your provider a message by [facility] may be a faster and more efficient way to get a response.  Please allow 48 business hours for a response.  Please remember that this is for non-urgent requests.   _______________________________________________________

## 2023-05-17 ENCOUNTER — Other Ambulatory Visit: Payer: Self-pay

## 2023-05-17 ENCOUNTER — Ambulatory Visit (INDEPENDENT_AMBULATORY_CARE_PROVIDER_SITE_OTHER): Payer: Medicaid Other | Admitting: Licensed Clinical Social Worker

## 2023-05-17 DIAGNOSIS — F332 Major depressive disorder, recurrent severe without psychotic features: Secondary | ICD-10-CM | POA: Diagnosis not present

## 2023-05-20 ENCOUNTER — Ambulatory Visit (INDEPENDENT_AMBULATORY_CARE_PROVIDER_SITE_OTHER): Payer: Medicaid Other | Admitting: Licensed Clinical Social Worker

## 2023-05-20 DIAGNOSIS — F332 Major depressive disorder, recurrent severe without psychotic features: Secondary | ICD-10-CM | POA: Diagnosis not present

## 2023-05-21 ENCOUNTER — Ambulatory Visit (HOSPITAL_COMMUNITY): Payer: Medicaid Other

## 2023-05-21 ENCOUNTER — Encounter (HOSPITAL_COMMUNITY): Payer: Self-pay

## 2023-05-22 ENCOUNTER — Encounter (HOSPITAL_COMMUNITY): Payer: Self-pay

## 2023-05-22 ENCOUNTER — Ambulatory Visit (INDEPENDENT_AMBULATORY_CARE_PROVIDER_SITE_OTHER): Payer: Medicaid Other | Admitting: Licensed Clinical Social Worker

## 2023-05-22 ENCOUNTER — Other Ambulatory Visit: Payer: Self-pay

## 2023-05-22 DIAGNOSIS — F332 Major depressive disorder, recurrent severe without psychotic features: Secondary | ICD-10-CM

## 2023-05-22 NOTE — Progress Notes (Signed)
Spoke with patient via Teams video call, used 2 identifiers to correctly identify patient. He was recommended for PHP by his psychiatrist. Dealing with a lot of grief. His mom died in 06-18-17, GM died 39, cousins have died, lost his horse, car, job, and living between his aunts and brother. On scale 1-10 as 10 being worst he rates depression at 10 and anxiety at 8. Denies SI/HI or AV hallucinations. PHQ9=24. Groups are going well so far. No issues or complaints. No side effects from medication.

## 2023-05-23 ENCOUNTER — Encounter (HOSPITAL_COMMUNITY): Payer: Self-pay

## 2023-05-23 ENCOUNTER — Ambulatory Visit (HOSPITAL_COMMUNITY): Payer: Medicaid Other

## 2023-05-23 DIAGNOSIS — F332 Major depressive disorder, recurrent severe without psychotic features: Secondary | ICD-10-CM

## 2023-05-23 DIAGNOSIS — G4733 Obstructive sleep apnea (adult) (pediatric): Secondary | ICD-10-CM | POA: Diagnosis not present

## 2023-05-24 ENCOUNTER — Encounter (HOSPITAL_COMMUNITY): Payer: Self-pay | Admitting: Licensed Clinical Social Worker

## 2023-05-24 ENCOUNTER — Ambulatory Visit (HOSPITAL_COMMUNITY): Payer: Medicaid Other | Admitting: Licensed Clinical Social Worker

## 2023-05-24 DIAGNOSIS — F332 Major depressive disorder, recurrent severe without psychotic features: Secondary | ICD-10-CM

## 2023-05-24 NOTE — Progress Notes (Addendum)
Met with patient through virtual connection as he presented with flat affect, level mood and admitted he was a little "frustrated today" explaining he is living with his aunt currently and temporarily but that she keeps accusing him of losing or misplacing their TV remote control.  Patient stated this just bothers him some because it happens often.  Stated he is between her house and his brothers place staying so does rely on them for housing assistance.  Patient denied any auditory or visual hallucinations, no suicidal or homicidal ideations and no plan, intent, or means to want to harm self or others at this time.  Patient rated his depression a 10 and anxiety an 8 on a scale of 0-10 with 0 being none and 10 the worst, stating this was just because of his frustrations with his aunt and current living arrangements.  Patient reviewed all medications with this nurse and denied any current side effects or issues with medication regimen. Patient stated feeling PHP was helping some and no other concerns today.  Patient agreed to contact this nurse or PHP staff if any changes in thoughts and if he ever wanted to harm himself or others or if any issues with medications.

## 2023-05-24 NOTE — Progress Notes (Signed)
Virtual Visit via Video Note  I connected with Phillip Gill on 05/24/23 at  9:00 AM EST by a video enabled telemedicine application and verified that I am speaking with the correct person using two identifiers.  Location: Patient: Home Provider: Office    I discussed the limitations of evaluation and management by telemedicine and the availability of in person appointments. The patient expressed understanding and agreed to proceed.    I discussed the assessment and treatment plan with the patient. The patient was provided an opportunity to ask questions and all were answered. The patient agreed with the plan and demonstrated an understanding of the instructions.   The patient was advised to call back or seek an in-person evaluation if the symptoms worsen or if the condition fails to improve as anticipated.  I provided 00 minutes of non-face-to-face time during this encounter.   Phillip Rack, NP   [facility] MD/PA/NP OP Progress Note  05/24/2023 9:53 AM Phillip Gill  MRN:  [id number]  Chief Complaint: " I am good."  HPI: Phillip Gill 47 year old African-American male presents with a history of depression, anxiety and panic attacks.  States he was referred by his psychiatrist Phillip Gill.  Initially reported he is unsure why he was referred.  Patient then later states may be due to his depression.  Patient did not elaborate on any other pressing symptoms.  He denies that he group setting has been beneficial at this time.  He reports he has been taking his medication as directed.  Is unable to recall the name of his medications but states he takes medications that is listed in my chart.  Patient was asked about side effects related to Abilify.  He states he recently just started taking this medication 1 week prior.  Continues to endorse decreased concentration, low mood and sleeping most of the day.  Patient appears to be concrete and limited throughout this assessment.  States " I have to  stay here for 3 weeks."  Denied that he is currently employed.  States he is working on trying to get set up with disability.  Denied concerns related to suicidal or homicidal ideations.  Denies visual hallucinations.  States" sometimes I hear people calling my name."  Reports that is not a new symptom.  Chart review patient is currently prescribed Wellbutrin, Abilify, Lexapro and propranolol.  No other concerns addressed at this visit.  Support, encouragement and reassurance was provided.   Visit Diagnosis:    ICD-10-CM   1. 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 Luciana Gill- sees him every 4 months   Hypertension    IBS (irritable bowel syndrome)    Migraine    "none in years" (02/16/2015   Overweight 07/20/2015   Renal insufficiency 12/29/2013    Past Surgical History:  Procedure Laterality Date   CO2 LASER APPLICATION N/A 02/11/2013   Procedure: CO2 LASER APPLICATION;  Surgeon: Romie Levee, MD;  Location: [facility] [place];  Service: General;  Laterality:  N/A;   FOOT SURGERY     HIGH RESOLUTION ANOSCOPY N/A 02/11/2013   Procedure: HIGH RESOLUTION ANOSCOPY WITH BIOPSY, LASER ABLATION;  Surgeon: Romie Levee, MD;  Location: [facility];  Service: General;  Laterality: N/A;   WISDOM TOOTH EXTRACTION      Family Psychiatric History:   Family History:  Family History  Problem Relation Age of Onset   Hypertension Mother    Diabetes Mother    Stroke Mother        cerbral aneurysm   Pancreatic cancer Mother    Colon cancer Father    Hypertension Brother    Sleep  apnea Cousin    Mental illness Neg Hx    Stomach cancer Neg Hx    Esophageal cancer Neg Hx     Social History:  Social History   Socioeconomic History   Marital status: Single    Spouse name: Not on file   Number of children: Not on file   Years of education: Not on file   Highest education level: Not on file  Occupational History   Not on file  Tobacco Use   Smoking status: Former    Current packs/day: 0.00    Average packs/day: 1 pack/day for 18.0 years (18.0 ttl pk-yrs)    Types: Cigarettes    Start date: 07/03/2003    Quit date: 07/02/2021    Years since quitting: 1.8    Passive exposure: Never   Smokeless tobacco: Never   Tobacco comments:    Pt vapes everyday  Vaping Use   Vaping status: Former   Quit date: 12/01/2022   Substances: Nicotine  Substance and Sexual Activity   Alcohol use: Yes    Comment: occ   Drug use: No   Sexual activity: Not on file    Comment: accepted condoms  Other Topics Concern   Not on file  Social History Narrative   Grew up in [place], now living in [place],-  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    Finished HS.   Doing online classes with [employer]- studying accounting   Previously 2 years of classes at [employer].    Has Partner- Phillip Gill- together for 1 year.    Has 2 girls- (born in 2000).          Social Determinants of Health   Financial Resource Strain: High Risk (02/05/2023)   Overall Financial Resource Strain (CARDIA)    Difficulty of Paying Living Expenses: Very hard  Food Insecurity: Food Insecurity Present (02/05/2023)   Hunger Vital Sign    Worried About Running Out of Food in the Last Year: Sometimes true    Ran Out of Food in the Last Year: Sometimes true  Transportation Needs: Unmet Transportation Needs (02/05/2023)   PRAPARE - Administrator, Civil Service (Medical): Yes    Lack of Transportation (Non-Medical): Yes  Physical Activity: Sufficiently Active (07/16/2018)   [facility]  [facility], [facility]   Exercise Vital Sign    Days of Exercise per Week: 4 days    Minutes of Exercise per Session: 150+ min  Stress: Stress Concern Present (03/01/2023)   [employer] of Occupational Health - Occupational Stress Questionnaire    Feeling of Stress : To some extent  Social Connections: Unknown (05/23/2022)   [facility] from [facility]   Social Network    Social Network: Not on file    Allergies:  Allergies  Allergen Reactions  Bactrim [Sulfamethoxazole W/Trimethoprim (Co-Trimoxazole)] Hives and Shortness Of Breath   Sulfa Antibiotics Hives and Shortness Of Breath   Cymbalta [Duloxetine Hcl] Diarrhea   Duloxetine Diarrhea   Other Hives and Swelling    [employer] toothpaste    Zoloft [Sertraline Hcl] Diarrhea   Clindamycin/Lincomycin Rash   Neurontin [Gabapentin] Rash    Metabolic Disorder Labs: Lab Results  Component Value Date   HGBA1C 5.5 10/02/2014   MPG 111 10/02/2014   No results found for: "PROLACTIN" Lab Results  Component Value Date   CHOL 143 10/23/2022   TRIG 127 10/23/2022   HDL 30 (L) 10/23/2022   CHOLHDL 4.8 10/23/2022   VLDL 16 02/14/2015   LDLCALC 91 10/23/2022   LDLCALC 106 (H) 05/14/2022   No results found for: "TSH"  Therapeutic Level Labs: No results found for: "LITHIUM" No results found for: "VALPROATE" No results found for: "CBMZ"  Current Medications: Current Outpatient Medications  Medication Sig Dispense Refill   amLODipine (NORVASC) 10 MG tablet Take 1 tablet (10 mg total) by mouth daily. 90 tablet 1   ARIPiprazole (ABILIFY) 5 MG tablet Take 1 tablet (5 mg total) by mouth daily. 90 tablet 0   bictegravir-emtricitabine-tenofovir AF (BIKTARVY) 50-200-25 MG TABS tablet TAKE 1 TABLET BY MOUTH EVERY DAY 30 tablet 5   buPROPion (WELLBUTRIN XL) 150 MG 24 hr tablet Take 1 tablet (150 mg total) by mouth daily. 30 tablet 0   diclofenac Sodium (VOLTAREN) 1 % GEL Apply 4  grams topically 4 (four) times daily to affected joint. 100 g 11   escitalopram (LEXAPRO) 20 MG tablet Take 1 tablet (20 mg total) by mouth daily. 30 tablet 5   esomeprazole (NEXIUM) 40 MG capsule Take 1 capsule (40 mg total) by mouth daily before breakfast. 90 capsule 0   famotidine (PEPCID) 40 MG tablet Take 1 tablet (40 mg total) by mouth at bedtime. 90 tablet 0   finasteride (PROSCAR) 5 MG tablet Take 1 tablet (5 mg total) by mouth daily. 30 tablet 1   polyethylene glycol powder (GLYCOLAX/MIRALAX) 17 GM/SCOOP powder Dissolve 17 grams in liquid and drink by mouth every day. 476 g 0   propranolol (INDERAL) 10 MG tablet Take 1 tablet (10 mg total) by mouth 2 (two) times daily as needed. 60 tablet 0   tadalafil (CIALIS) 10 MG tablet Take 0.5 tablets (5 mg total) by mouth daily. 45 tablet 0   No current facility-administered medications for this visit.     Musculoskeletal: Tele-assessment   Psychiatric Specialty Exam: Review of Systems  Psychiatric/Behavioral:  Positive for decreased concentration. Negative for sleep disturbance. Hallucinations: Auditory.  All other systems reviewed and are negative.   There were no vitals taken for this visit.There is no height or weight on file to calculate BMI.  General Appearance: Casual  Eye Contact:  Fair  Speech:  Clear and Coherent  Volume:  Normal  Mood:  Depressed  Affect:  Congruent  Thought Process:  Coherent  Orientation:  Full (Time, Place, and Person)  Thought Content: Logical   Suicidal Thoughts:  No  Homicidal Thoughts:  No  Memory:  Immediate;   Fair Recent;   Fair  Judgement:  Fair  Insight:  Good  Psychomotor Activity:  Normal  Concentration:  Concentration: Good  Recall:  Good  Fund of Knowledge: Good  Language: Good  Akathisia:  No  Handed:  Right  AIMS (if indicated): not done  Assets:  Communication Skills Desire for Improvement Resilience  ADL's:  Intact  Cognition:  WNL  Sleep:  Fair   Screenings: GAD-7     Flowsheet Row Office Visit from 03/05/2023 in [facility] Visit from 02/05/2023 in [facility]  Total GAD-7 Score 21 21      PHQ2-9    Flowsheet Row Counselor from 05/15/2023 in [facility] Counselor from 05/09/2023 in [facility] Office Visit from 04/01/2023 in [facility] [place] Office Visit from 03/05/2023 in [facility] Patient Outreach Telephone from 02/13/2023 in [place] [facility]  PHQ-2 Total Score 6 6 1 6 6   PHQ-9 Total Score 24 25 -- 27 22      Flowsheet Row Counselor from 05/15/2023 in [facility] Counselor from 05/09/2023 in [facility] ED from 12/19/2022 in [facility]  C-SSRS RISK CATEGORY Error: Q3, 4, or 5 should not be populated when Q2 is No Moderate Risk Low Risk        Assessment and Plan:  Continue partial hospitalization programming [facility] Continue medications as indicated Anticipated discharge 11/26   Collaboration of Care: Collaboration of Care: Psychiatrist AEB MD Roland Earl was advised Release of Information must be obtained prior to any record release in order to collaborate their care with an outside provider. Patient/Guardian was advised if they have not already done so to contact the registration department to sign all necessary forms in order for [place] to release information regarding their care.   Consent: Patient/Guardian gives verbal consent for treatment and assignment of benefits for services provided during this visit. Patient/Guardian expressed understanding and agreed to proceed.    Phillip Rack, NP 05/24/2023, 9:53 AM

## 2023-05-25 NOTE — Psych (Signed)
Virtual Visit via Video Note  I connected with Phillip Gill on 05/15/23 at  9:00 AM EST by a video enabled telemedicine application and verified that I am speaking with the correct person using two identifiers.  Location: Patient: patient home Provider: clinical home office   I discussed the limitations of evaluation and management by telemedicine and the availability of in person appointments. The patient expressed understanding and agreed to proceed.  I discussed the assessment and treatment plan with the patient. The patient was provided an opportunity to ask questions and all were answered. The patient agreed with the plan and demonstrated an understanding of the instructions.   The patient was advised to call back or seek an in-person evaluation if the symptoms worsen or if the condition fails to improve as anticipated.  Pt was provided 240 minutes of non-face-to-face time during this encounter.   Donia Guiles, LCSW   [facility] PHP THERAPIST PROGRESS NOTE  KENETH ETCHELLS [id number]  Session Time: 9:00 - 10:00  Participation Level: Minimal  Behavioral Response: CasualAlertDepressed  Type of Therapy: Group Therapy  Treatment Goals addressed: Coping  Progress Towards Goals: Initial  Interventions: CBT, DBT, Supportive, and Reframing  Summary: Phillip Gill is a 47 y.o. male who presents with depression symptoms.  Clinician led check-in regarding current stressors and situation, and review of patient completed daily inventory. Clinician utilized active listening and empathetic response and validated patient emotions. Clinician facilitated processing group on pertinent issues.?    Therapist Response:  Patient arrived within time allowed. Patient rates his mood at a 1 on a scale of 1-10 with 10 being best. Pt states he feels "bad." Pt states he slept 5 broken hours and ate 2x. Pt reports he didn't do anything yesterday and slept most of the day. Pt states he sleeps  a lot so that he doesn't have to think about things. Pt reports hopelessness and chronic passive SI and denies plan/intent.         Session Time: 10:00 am - 11:00 am   Participation Level: Minimal   Behavioral Response: CasualAlertDepressed   Type of Therapy: Group Therapy   Treatment Goals addressed: Coping   Progress Towards Goals: Progressing   Interventions: CBT, DBT, Solution Focused, Strength-based, Supportive, and Reframing   Therapist Response: [name] led processing group for pt's current struggles. Group members shared stressors and provided support and feedback. [name] brought in topics of boundaries, healthy relationships, and unhealthy thought processes to inform discussion.    Therapist Response:  Pt able to process and provide support to group.            Session Time: 11:00 -12:00   Participation Level: Minimal   Behavioral Response: CasualAlertDepressed   Type of Therapy: Group Therapy   Treatment Goals addressed: Coping   Progress Towards Goals: Progressing   Interventions: Strength-based, Supportive, and Reframing   Summary: [name]   Therapist Response: Pt participated and engaged in discussion.          Session Time: 12:00 -1:00   Participation Level: Minimal   Behavioral Response: CasualAlertDepressed   Type of Therapy: Group therapy   Treatment Goals addressed: Coping   Progress Towards Goals: Progressing   Interventions: OT group   Summary: 12:00 - 12:50: [name].  12:50 - 1:00 Clinician assessed for immediate needs, medication compliance and efficacy, and safety concerns.   Therapist Response: 12:00 - 12:50: Pt participated 12:50 - 1:00 pm: At  check-out, patient reports no immediate concerns. Patient demonstrates progress as evidenced by participating in first group session. Patient denies SI/HI/self-harm thoughts at the end of group.    Suicidal/Homicidal:  Yeswithout intent/plan  Plan: Pt will continue in PHP while working to decrease depression symptoms, increase daily functioning, and increase ability to manage symptoms in a healthy manner.   Collaboration of Care: Medication Management AEB C Cosby, MD  Patient/Guardian was advised Release of Information must be obtained prior to any record release in order to collaborate their care with an outside provider. Patient/Guardian was advised if they have not already done so to contact the registration department to sign all necessary forms in order for [place] to release information regarding their care.   Consent: Patient/Guardian gives verbal consent for treatment and assignment of benefits for services provided during this visit. Patient/Guardian expressed understanding and agreed to proceed.   Diagnosis: Severe episode of recurrent major depressive disorder, without psychotic features (HCC) [F33.2]    1. Severe episode of recurrent major depressive disorder, without psychotic features (HCC)   2. Passive suicidal ideations   3. Anxiety       Donia Guiles, LCSW

## 2023-05-26 NOTE — Psych (Signed)
Virtual Visit via Video Note  I connected with Phillip Gill on 05/17/23 at  9:00 AM EST by a video enabled telemedicine application and verified that I am speaking with the correct person using two identifiers.  Location: Patient: patient home Provider: clinical home office   I discussed the limitations of evaluation and management by telemedicine and the availability of in person appointments. The patient expressed understanding and agreed to proceed.  I discussed the assessment and treatment plan with the patient. The patient was provided an opportunity to ask questions and all were answered. The patient agreed with the plan and demonstrated an understanding of the instructions.   The patient was advised to call back or seek an in-person evaluation if the symptoms worsen or if the condition fails to improve as anticipated.  Pt was provided 240 minutes of non-face-to-face time during this encounter.   Donia Guiles, LCSW   [facility] PHP THERAPIST PROGRESS NOTE  Phillip Gill [id number]  Session Time: 9:00 - 10:00  Participation Level: Minimal  Behavioral Response: CasualAlertDepressed  Type of Therapy: Group Therapy  Treatment Goals addressed: Coping  Progress Towards Goals: Initial  Interventions: CBT, DBT, Supportive, and Reframing  Summary: Phillip Gill is a 47 y.o. male who presents with depression symptoms.  Clinician led check-in regarding current stressors and situation, and review of patient completed daily inventory. Clinician utilized active listening and empathetic response and validated patient emotions. Clinician facilitated processing group on pertinent issues.?    Therapist Response:  Patient arrived within time allowed. Patient rates his mood at a 1 on a scale of 1-10 with 10 being best. Pt states he feels "bad." Pt states he slept 10 broken hours and ate 2x. Pt reports he had a doctor's appt yesterday and will have to do more tests next month for  more information. Pt reports he went roller skating with his cousin yesterday and is sore today. Pt exhibits slight non-linear thought and is difficult to converse with due to lack of clarity with short answers and lack of follow through. Pt reports hopelessness and chronic passive SI and denies plan/intent.         Session Time: 10:00 am - 11:00 am   Participation Level: Minimal   Behavioral Response: CasualAlertDepressed   Type of Therapy: Group Therapy   Treatment Goals addressed: Coping   Progress Towards Goals: Progressing   Interventions: CBT, DBT, Solution Focused, Strength-based, Supportive, and Reframing   Therapist Response: [name] continued topic of DBT distress tolerance skills and the ACCEPTS distraction skill. Group reviewed E-P-S skills and discussed how they can practice them in their every day life.    Therapist Response: Pt engaged in discussion and is able to state ways to apply these skills.            Session Time: 11:00 -12:00   Participation Level: Minimal   Behavioral Response: CasualAlertDepressed   Type of Therapy: Group Therapy   Treatment Goals addressed: Coping   Progress Towards Goals: Progressing   Interventions: CBT, DBT, Solution Focused, Strength-based, Supportive, and Reframing   Summary: [name] led discussion on ways to manage stressors and feelings over the weekend. Group members  brainstormed things to do over the weekend for multiple levels of energy, access, and moods. [name] reviewed crisis services should they be needed and provided pt's with the text crisis line, mobile crisis, national suicide hotline, Phillip Gill 24/7 line, and information on [facility].      Therapist Response:  Pt  engaged in discussion and is able to identify 3 ideas of what to do over the weekend to keep their mind engaged.         Session Time: 12:00 -1:00   Participation Level: Minimal   Behavioral Response: CasualAlertDepressed   Type of Therapy: Group  therapy   Treatment Goals addressed: Coping   Progress Towards Goals: Progressing   Interventions: OT group   Summary: 12:00 - 12:50: [name].  12:50 - 1:00 Clinician assessed for immediate needs, medication compliance and efficacy, and safety concerns.   Therapist Response: 12:00 - 12:50: Pt participated 12:50 - 1:00 pm: At check-out, patient reports no immediate concerns. Patient demonstrates progress as evidenced by continued engagement and responsiveness to treatment. Patient denies SI/HI/self-harm thoughts at the end of group.    Suicidal/Homicidal: Yeswithout intent/plan  Plan: Pt will continue in PHP while working to decrease depression symptoms, increase daily functioning, and increase ability to manage symptoms in a healthy manner.   Collaboration of Care: Medication Management AEB C Cosby, MD  Patient/Guardian was advised Release of Information must be obtained prior to any record release in order to collaborate their care with an outside provider. Patient/Guardian was advised if they have not already done so to contact the registration department to sign all necessary forms in order for [place] to release information regarding their care.   Consent: Patient/Guardian gives verbal consent for treatment and assignment of benefits for services provided during this visit. Patient/Guardian expressed understanding and agreed to proceed.   Diagnosis: Severe episode of recurrent major depressive disorder, without psychotic features (HCC) [F33.2]    1. Severe episode of recurrent major depressive disorder, without psychotic features (HCC)       Donia Guiles, LCSW

## 2023-05-26 NOTE — Psych (Signed)
Virtual Visit via Video Note  I connected with Phillip Gill on 05/20/23 at  9:00 AM EST by a video enabled telemedicine application and verified that I am speaking with the correct person using two identifiers.  Location: Patient: patient home Provider: clinical home office   I discussed the limitations of evaluation and management by telemedicine and the availability of in person appointments. The patient expressed understanding and agreed to proceed.  I discussed the assessment and treatment plan with the patient. The patient was provided an opportunity to ask questions and all were answered. The patient agreed with the plan and demonstrated an understanding of the instructions.   The patient was advised to call back or seek an in-person evaluation if the symptoms worsen or if the condition fails to improve as anticipated.  Pt was provided 240 minutes of non-face-to-face time during this encounter.   Donia Guiles, LCSW   [facility] PHP THERAPIST PROGRESS NOTE  Phillip Gill [id number]  Session Time: 9:00 - 10:00  Participation Level: Minimal  Behavioral Response: CasualAlertDepressed  Type of Therapy: Group Therapy  Treatment Goals addressed: Coping  Progress Towards Goals: Initial  Interventions: CBT, DBT, Supportive, and Reframing  Summary: Phillip Gill is a 47 y.o. male who presents with depression symptoms.  Clinician led check-in regarding current stressors and situation, and review of patient completed daily inventory. Clinician utilized active listening and empathetic response and validated patient emotions. Clinician facilitated processing group on pertinent issues.?    Therapist Response:  Patient arrived within time allowed. Patient rates his mood at a 1 on a scale of 1-10 with 10 being best. Pt states he feels "worse today." Pt states he slept 9 hours and ate 1x. Pt reports it is his mom's birthday and she has been dead for 6 years. Pt states the day  is always difficult for him and he typically sleeps through it. Pt reports his weekend was "busy" and he went to a church conference with his cousin. Pt reports it was difficult for him to be around people but having his cousin with him helped. Pt reports hopelessness and chronic passive SI and denies plan/intent.         Session Time: 10:00 am - 11:00 am   Participation Level: Minimal   Behavioral Response: CasualAlertDepressed   Type of Therapy: Group Therapy   Treatment Goals addressed: Coping   Progress Towards Goals: Progressing   Interventions: CBT, DBT, Solution Focused, Strength-based, Supportive, and Reframing   Therapist Response: [name] led processing group for pt's current struggles. Group members shared stressors and provided support and feedback. [name] brought in topics of boundaries, healthy relationships, and unhealthy thought processes to inform discussion.    Therapist Response: Pt able to process and provide support to group.            Session Time: 11:00 -12:00   Participation Level: Minimal   Behavioral Response: CasualAlertDepressed   Type of Therapy: Group Therapy   Treatment Goals addressed: Coping   Progress Towards Goals: Progressing   Interventions: CBT, DBT, Solution Focused, Strength-based, Supportive, and Reframing   Summary: [name] introduced the "Thoughts" distraction skills from DBT distress tolerance skill ACCEPTS. [name] discussed how this set of distraction skills can be helpful when in situations where outside resources are limited or unavailable. Group practiced and brainstormed ways to apply the Thought skill.     Therapist Response:  Pt engaged in discussion and determines ways to practice the skills.  Session Time: 12:00 -1:00   Participation Level: Minimal   Behavioral Response: CasualAlertDepressed   Type of Therapy: Group therapy   Treatment Goals addressed: Coping   Progress Towards Goals: Progressing    Interventions: CBT, DBT, Solution Focused, Strength-based, Supportive, and Reframing   Summary: 12:00 - 12:50: [name] led discussion on personal standards and they way in which it impacts the way we view ourselves and our abilities. Group members discussed judgment, struggles, and barriers they experience in terms of personal standards. [name] brought in topics of balance, grace, and kindness. [name] proposed the "best friend test" as a way to calibrate whether we are viewing our situation with kindness or harshness.  12:50 - 1:00 Clinician assessed for immediate needs, medication compliance and efficacy, and safety concerns.   Therapist Response: 12:00 - 12:50: Pt engaged in discussion and reports willingness to utilize the best friend test.  12:50 - 1:00 pm: At check-out, patient reports no immediate concerns. Patient demonstrates progress as evidenced by continued engagement and responsiveness to treatment. Patient denies SI/HI/self-harm thoughts at the end of group.    Suicidal/Homicidal: Yeswithout intent/plan  Plan: Pt will continue in PHP while working to decrease depression symptoms, increase daily functioning, and increase ability to manage symptoms in a healthy manner.   Collaboration of Care: Medication Management AEB C Cosby, MD  Patient/Guardian was advised Release of Information must be obtained prior to any record release in order to collaborate their care with an outside provider. Patient/Guardian was advised if they have not already done so to contact the registration department to sign all necessary forms in order for [place] to release information regarding their care.   Consent: Patient/Guardian gives verbal consent for treatment and assignment of benefits for services provided during this visit. Patient/Guardian expressed understanding and agreed to proceed.   Diagnosis: Severe episode of recurrent major depressive disorder, without psychotic features (HCC) [F33.2]    1. Severe episode  of recurrent major depressive disorder, without psychotic features (HCC)       Donia Guiles, LCSW

## 2023-05-27 ENCOUNTER — Other Ambulatory Visit (HOSPITAL_COMMUNITY): Payer: Self-pay

## 2023-05-27 ENCOUNTER — Ambulatory Visit (HOSPITAL_COMMUNITY): Payer: Medicaid Other

## 2023-05-27 ENCOUNTER — Other Ambulatory Visit: Payer: Self-pay | Admitting: Family Medicine

## 2023-05-27 ENCOUNTER — Ambulatory Visit (HOSPITAL_COMMUNITY): Payer: Medicaid Other | Admitting: Student

## 2023-05-27 ENCOUNTER — Other Ambulatory Visit: Payer: Self-pay

## 2023-05-27 DIAGNOSIS — F330 Major depressive disorder, recurrent, mild: Secondary | ICD-10-CM

## 2023-05-27 DIAGNOSIS — F332 Major depressive disorder, recurrent severe without psychotic features: Secondary | ICD-10-CM

## 2023-05-27 MED ORDER — ARIPIPRAZOLE 5 MG PO TABS
5.0000 mg | ORAL_TABLET | Freq: Every day | ORAL | 0 refills | Status: DC
  Filled 2023-05-27: qty 30, 30d supply, fill #0
  Filled 2023-06-09 – 2023-06-23 (×2): qty 30, 30d supply, fill #1
  Filled 2023-08-04: qty 30, 30d supply, fill #2

## 2023-05-27 NOTE — Psych (Signed)
Virtual Visit via Video Note  I connected with Phillip Gill on 05/22/23 at  9:00 AM EST by a video enabled telemedicine application and verified that I am speaking with the correct person using two identifiers.  Location: Patient: patient home Provider: clinical home office   I discussed the limitations of evaluation and management by telemedicine and the availability of in person appointments. The patient expressed understanding and agreed to proceed.  I discussed the assessment and treatment plan with the patient. The patient was provided an opportunity to ask questions and all were answered. The patient agreed with the plan and demonstrated an understanding of the instructions.   The patient was advised to call back or seek an in-person evaluation if the symptoms worsen or if the condition fails to improve as anticipated.  Pt was provided 240 minutes of non-face-to-face time during this encounter.   Phillip Guiles, LCSW   [name]  Phillip Gill [id number]  Session Time: 9:00 - 10:00  Participation Level: Minimal  Behavioral Response: CasualAlertDepressed  Type of Therapy: Group Therapy  Treatment Goals addressed: Coping  Progress Towards Goals: Initial  Interventions: CBT, DBT, Supportive, and Reframing  Summary: Phillip Gill is a 47 y.o. male who presents with depression symptoms.  Clinician led check-in regarding current stressors and situation, and review of patient completed daily inventory. Clinician utilized active listening and empathetic response and validated patient emotions. Clinician facilitated processing group on pertinent issues.?    Therapist Response:  Patient arrived within time allowed. Patient rates his mood at a 2 on a scale of 1-10 with 10 being best. Pt states he feels "not too good." Pt states he slept 10 broken hours and ate 2x. Pt reports he had another appointment yesterday and slept most of the rest of  the day. Pt states he saw his dad and his health is worse than pt expected which was difficult. Pt reports he is exhausted and drained. Pt continues to be reserved and short with answers. Pt reports hopelessness and chronic passive SI and denies plan/intent.         Session Time: 10:00 am - 11:00 am   Participation Level: Minimal   Behavioral Response: CasualAlertDepressed   Type of Therapy: Group Therapy   Treatment Goals addressed: Coping   Progress Towards Goals: Progressing   Interventions: CBT, DBT, Solution Focused, Strength-based, Supportive, and Reframing   Therapist Response: [name] led processing group for pt's current struggles. Group members shared stressors and provided support and feedback. [name] brought in topics of boundaries, healthy relationships, and unhealthy thought processes to inform discussion.    Therapist Response:  Pt able to process and provide support to group.            Session Time: 11:00 -12:00   Participation Level: Minimal   Behavioral Response: CasualAlertDepressed   Type of Therapy: Group Therapy   Treatment Goals addressed: Coping   Progress Towards Goals: Progressing   Interventions: Strength-based, Supportive, and Reframing   Summary: Chaplaincy group with Phillip Gill   Therapist Response: Pt participated and engaged in discussion.          Session Time: 12:00 -1:00   Participation Level: Minimal   Behavioral Response: CasualAlertDepressed   Type of Therapy: Group therapy   Treatment Goals addressed: Coping   Progress Towards Goals: Progressing   Interventions: OT group   Summary: 12:00 - 12:50: Occupational Therapy group with cln Phillip Gill.  12:50 - 1:00 Clinician assessed for  immediate needs, medication compliance and efficacy, and safety concerns.   Therapist Response: 12:00 - 12:50: Pt participated 12:50 - 1:00 pm: At check-out, patient reports no immediate concerns. Patient demonstrates progress as evidenced by  continued engagement and responsiveness to treatment. Patient denies SI/HI/self-harm thoughts at the end of group.    Suicidal/Homicidal: Yeswithout intent/plan  Plan: Pt will continue in PHP while working to decrease depression symptoms, increase daily functioning, and increase ability to manage symptoms in a healthy manner.   Collaboration of Care: Medication Management AEB Phillip Cosby, MD  Patient/Guardian was advised Release of Information must be obtained prior to any record release in order to collaborate their care with an outside provider. Patient/Guardian was advised if they have not already done so to contact the registration department to sign all necessary forms in order for [place] to release information regarding their care.   Consent: Patient/Guardian gives verbal consent for treatment and assignment of benefits for services provided during this visit. Patient/Guardian expressed understanding and agreed to proceed.   Diagnosis: Severe episode of recurrent major depressive disorder, without psychotic features (HCC) [F33.2]    1. Severe episode of recurrent major depressive disorder, without psychotic features (HCC)       Phillip Guiles, LCSW

## 2023-05-28 ENCOUNTER — Ambulatory Visit (HOSPITAL_COMMUNITY): Payer: Medicaid Other

## 2023-05-28 DIAGNOSIS — F332 Major depressive disorder, recurrent severe without psychotic features: Secondary | ICD-10-CM | POA: Diagnosis not present

## 2023-05-28 NOTE — Progress Notes (Unsigned)
Virtual Visit via Video Note  I connected with ZACORY BORCHARDT on 05/28/23 at  9:00 AM EST by a video enabled telemedicine application and verified that I am speaking with the correct person using two identifiers.  Location: Patient: Home Provider: Office   I discussed the limitations of evaluation and management by telemedicine and the availability of in person appointments. The patient expressed understanding and agreed to proceed.    I discussed the assessment and treatment plan with the patient. The patient was provided an opportunity to ask questions and all were answered. The patient agreed with the plan and demonstrated an understanding of the instructions.   The patient was advised to call back or seek an in-person evaluation if the symptoms worsen or if the condition fails to improve as anticipated.  I provided 15 minutes of non-face-to-face time during this encounter.   Oneta Rack, NP   [facility] Intensive Outpatient Program Discharge Summary  RALPHAEL TRAME [id number]  Admission date: 05/02/2023 Discharge date: 05/29/2023  Reason for admission: Per admission assessment note: "DEJAUN CHROMY is a 47 y.o. male with a history of MDD, GAD, panic attacks, HIV on HAART, OSA not on CPAP, chronic hepatitis B, anal condyloma, GERD, hypertension who presents in person to [facility] for medication management.  Patient reports significant depressive symptoms and anxiety with associated panic attacks.  He has found Lexapro and Abilify mildly improve his mood symptoms however for he still remains significantly depressed.  He has been unemployed since January 2024 after a breakdown and has been on a ball to return to work."    Progress in Program Toward Treatment Goals: Not Progressing patient observed attending daily group session.  Minimal participation.  Patient was seen for multiple check-in appointments.  He denied that he is getting  anything out of group setting at this time.  He reports the recent passing of his father which was unexpected.  Reports he is grieving and it brings back memories of how his mother passed away in a similar manner.  He denied any other concerns at this visit.  He reports he has follow-up appointment with his provider at the [facility] urgent care facility.  No concerns related to medication refill.  Treatment team to provide additional outpatient resources for grief loss  Progress (rationale): He denied that group setting was beneficial at this time.  He is unable to identify any coping strategies and/or techniques.  Does state " my mind is in a fog."  Collaboration of Care: Medication Management AEB continue Abilify, keep follow-up with psychiatrist Roland Earl was advised Release of Information must be obtained prior to any record release in order to collaborate their care with an outside provider. Patient/Guardian was advised if they have not already done so to contact the registration department to sign all necessary forms in order for [place] to release information regarding their care.   Consent: Patient/Guardian gives verbal consent for treatment and assignment of benefits for services provided during this visit. Patient/Guardian expressed understanding and agreed to proceed.   Hillery Jacks NP  05/28/2023

## 2023-05-29 ENCOUNTER — Ambulatory Visit (HOSPITAL_COMMUNITY): Payer: Medicaid Other | Admitting: Licensed Clinical Social Worker

## 2023-05-29 ENCOUNTER — Encounter: Payer: Self-pay | Admitting: Family Medicine

## 2023-05-29 DIAGNOSIS — F332 Major depressive disorder, recurrent severe without psychotic features: Secondary | ICD-10-CM | POA: Diagnosis not present

## 2023-05-29 DIAGNOSIS — G8929 Other chronic pain: Secondary | ICD-10-CM

## 2023-05-29 NOTE — Psych (Signed)
Virtual Visit via Video Note  I connected with Phillip Gill on 05/29/23 at  9:00 AM EST by a video enabled telemedicine application and verified that I am speaking with the correct person using two identifiers.  Location: Patient: patient home Provider: clinical home office   I discussed the limitations of evaluation and management by telemedicine and the availability of in person appointments. The patient expressed understanding and agreed to proceed.  I discussed the assessment and treatment plan with the patient. The patient was provided an opportunity to ask questions and all were answered. The patient agreed with the plan and demonstrated an understanding of the instructions.   The patient was advised to call back or seek an in-person evaluation if the symptoms worsen or if the condition fails to improve as anticipated.  Pt was provided 240 minutes of non-face-to-face time during this encounter.   Donia Guiles, LCSW   [facility] PHP THERAPIST PROGRESS NOTE  Phillip Gill [id number]  Session Time: 9:00 - 10:00  Participation Level: Minimal  Behavioral Response: CasualAlertDepressed  Type of Therapy: Group Therapy  Treatment Goals addressed: Coping  Progress Towards Goals: Progressing  Interventions: CBT, DBT, Supportive, and Reframing  Summary: Phillip Gill is a 47 y.o. male who presents with depression symptoms.  Clinician led check-in regarding current stressors and situation, and review of patient completed daily inventory. Clinician utilized active listening and empathetic response and validated patient emotions. Clinician facilitated processing group on pertinent issues.?    Therapist Response:  Patient arrived within time allowed. Patient rates his mood at a 1 on a scale of 1-10 with 10 being best. Pt states he is "very tired." Pt states he slept 7 broken hours and ate 2x. Pt reports picking out funeral plots and caskets was difficult and emotional for  him. Pt shares his children came by his father's house to pay respects and pt was happy to see them. Pt states the funeral will be on Sunday and he will have support from his children and mom's side of the family. Pt          Session Time: 10:00 am - 11:00 am   Participation Level: Minimal   Behavioral Response: CasualAlertDepressed   Type of Therapy: Group Therapy   Treatment Goals addressed: Coping   Progress Towards Goals: Progressing   Interventions: CBT, DBT, Solution Focused, Strength-based, Supportive, and Reframing   Therapist Response: [name] led discussion on ways to reinforce new habits. Group shared ways in which they can set themselves up for good habits. [name] encouraged reminders in phone, post-it notes, educating support system, and practicing in low-stress environments.    Therapist Response: Pt engaged in discussion and is able to determine ways to reinforce positive habits.            Session Time: 11:00 -12:00   Participation Level: Minimal   Behavioral Response: CasualAlertDepressed   Type of Therapy: Group Therapy   Treatment Goals addressed: Coping   Progress Towards Goals: Progressing   Interventions: CBT, DBT, Solution Focused, Strength-based, Supportive, and Reframing   Summary: [name] introduced DBT distress tolerance skill IMPROVE.  [name] discussed how this set of skills are for when you have to sit through an undesirable feeling and wait for it to pass. Group discussed how to apply the IMPROVE skills to decrease distress at the undesired feeling.    Therapist Response: Pt engaged in discussion and is able to identify ways to apply the skill.  Session Time: 12:00 -1:00   Participation Level: Minimal   Behavioral Response: CasualAlertDepressed   Type of Therapy: Group therapy   Treatment Goals addressed: Coping   Progress Towards Goals: Progressing   Interventions: CBT, DBT, Solution Focused, Strength-based, Supportive, and  Reframing   Summary: 12:00 - 12:50: [name] led discussion on ways to manage stressors and feelings over the long weekend. Group members  brainstormed things to do over the weekend for multiple levels of energy, access, and moods. [name] reviewed crisis services should they be needed and provided pt's with the text crisis line, mobile crisis, national suicide hotline, [facility] 24/7 line, and information on [facility].    12:50 - 1:00 Clinician assessed for immediate needs, medication compliance and efficacy, and safety concerns.   Therapist Response: 12:00 - 12:50: Pt engaged in discussion and is able to identify 3 ideas of what to do over the long weekend to keep their mind engaged.  12:50 - 1:00 pm: At check-out, patient reports no immediate concerns. Patient demonstrates progress as evidenced by continued engagement and responsiveness to treatment. Patient denies SI/HI/self-harm thoughts at the end of group.    Suicidal/Homicidal: Nowithout intent/plan  Plan: Pt will discharge from PHP per request. Pt demonstrates progress towards goals AEB stabilization of symptoms and lack of escalation in hopelessness/passive SI. Pt will step-down to outpatient services with this agency. Pt and provider are aligned with discharge plan. Pt denies SI/HI at time of discharge.   Collaboration of Care: Medication Management AEB C Cosby, MD  Patient/Guardian was advised Release of Information must be obtained prior to any record release in order to collaborate their care with an outside provider. Patient/Guardian was advised if they have not already done so to contact the registration department to sign all necessary forms in order for [place] to release information regarding their care.   Consent: Patient/Guardian gives verbal consent for treatment and assignment of benefits for services provided during this visit. Patient/Guardian expressed understanding and agreed to proceed.   Diagnosis: Severe episode of recurrent major  depressive disorder, without psychotic features (HCC) [F33.2]    1. Severe episode of recurrent major depressive disorder, without psychotic features (HCC)       Donia Guiles, LCSW

## 2023-05-29 NOTE — Psych (Signed)
Virtual Visit via Video Note  I connected with Phillip Gill on 05/28/23 at  9:00 AM EST by a video enabled telemedicine application and verified that I am speaking with the correct person using two identifiers.  Location: Patient: patient home Provider: clinical home office   I discussed the limitations of evaluation and management by telemedicine and the availability of in person appointments. The patient expressed understanding and agreed to proceed.  I discussed the assessment and treatment plan with the patient. The patient was provided an opportunity to ask questions and all were answered. The patient agreed with the plan and demonstrated an understanding of the instructions.   The patient was advised to call back or seek an in-person evaluation if the symptoms worsen or if the condition fails to improve as anticipated.  Pt was provided 120 minutes of non-face-to-face time during this encounter.   Donia Guiles, LCSW   [facility] PHP THERAPIST PROGRESS NOTE  Phillip Gill [id number]  Session Time: 9:00 - 10:00  Participation Level: Minimal  Behavioral Response: CasualAlertDepressed  Type of Therapy: Group Therapy  Treatment Goals addressed: Coping  Progress Towards Goals: Progressing  Interventions: CBT, DBT, Supportive, and Reframing  Summary: Phillip Gill is a 47 y.o. male who presents with depression symptoms.  Clinician led check-in regarding current stressors and situation, and review of patient completed daily inventory. Clinician utilized active listening and empathetic response and validated patient emotions. Clinician facilitated processing group on pertinent issues.?    Therapist Response:  Patient arrived within time allowed. Patient rates his mood at a 1 on a scale of 1-10 with 10 being best. Pt states he is "hanging in." Pt states he slept 8 broken hours and ate 2x. Pt reports he spent the afternoon with his father's house yesterday with family.  Pt reports continued grief and struggling with his feelings.          Session Time: 10:00 am - 11:00 am   Participation Level: Minimal   Behavioral Response: CasualAlertDepressed   Type of Therapy: Group Therapy   Treatment Goals addressed: Coping   Progress Towards Goals: Progressing   Interventions: CBT, DBT, Solution Focused, Strength-based, Supportive, and Reframing   Therapist Response: [name] led discussion on the holidays and held space for group members to process plans, feelings, and worries regarding the upcoming holiday. [name] brought in DBT ACCEPTS skills, boundaries, CBT thought challenging, and planning ahead to inform discussion when appropriate.    Therapist Response: Pt able to process and provide support to group.      **Pt chose to leave group at 11 due to conflicting funeral appointments.       Suicidal/Homicidal: Yeswithout intent/plan  Plan: Pt will continue in PHP while working to decrease depression symptoms, increase daily functioning, and increase ability to manage symptoms in a healthy manner.   Collaboration of Care: Medication Management AEB C Cosby, MD  Patient/Guardian was advised Release of Information must be obtained prior to any record release in order to collaborate their care with an outside provider. Patient/Guardian was advised if they have not already done so to contact the registration department to sign all necessary forms in order for [place] to release information regarding their care.   Consent: Patient/Guardian gives verbal consent for treatment and assignment of benefits for services provided during this visit. Patient/Guardian expressed understanding and agreed to proceed.   Diagnosis: Severe episode of recurrent major depressive disorder, without psychotic features (HCC) [F33.2]    1. Severe episode of  recurrent major depressive disorder, without psychotic features (HCC)       Donia Guiles, LCSW

## 2023-05-29 NOTE — Addendum Note (Signed)
Addended by: Donia Guiles I on: 05/29/2023 07:37 PM   Modules accepted: Level of Service

## 2023-05-29 NOTE — Psych (Signed)
Virtual Visit via Video Note  I connected with BONHAM MASSARO on 05/27/23 at  9:00 AM EST by a video enabled telemedicine application and verified that I am speaking with the correct person using two identifiers.  Location: Patient: patient home Provider: clinical home office   I discussed the limitations of evaluation and management by telemedicine and the availability of in person appointments. The patient expressed understanding and agreed to proceed.  I discussed the assessment and treatment plan with the patient. The patient was provided an opportunity to ask questions and all were answered. The patient agreed with the plan and demonstrated an understanding of the instructions.   The patient was advised to call back or seek an in-person evaluation if the symptoms worsen or if the condition fails to improve as anticipated.  Pt was provided 240 minutes of non-face-to-face time during this encounter.   Donia Guiles, LCSW   [facility] PHP THERAPIST PROGRESS NOTE  PHYLLIS DARNLEY [id number]  Session Time: 9:00 - 10:00  Participation Level: Minimal  Behavioral Response: CasualAlertDepressed  Type of Therapy: Group Therapy  Treatment Goals addressed: Coping  Progress Towards Goals: Progressing  Interventions: CBT, DBT, Supportive, and Reframing  Summary: RICKIE COYE is a 47 y.o. male who presents with depression symptoms.  Clinician led check-in regarding current stressors and situation, and review of patient completed daily inventory. Clinician utilized active listening and empathetic response and validated patient emotions. Clinician facilitated processing group on pertinent issues.?    Therapist Response:  Patient arrived within time allowed. Patient rates his mood at a 1 on a scale of 1-10 with 10 being best. Pt states he is "holding it together." Pt states he slept 4 broken hours and ate 2x. Pt reports his father died on 06/13/2023 and pt was able to see him and speak  to him before he passed. Pt states he spent most of the weekend at his dad's house with family and receiving guests. Pt states his step-siblings have been including him which he appreciates. Pt is tearful throughout session.          Session Time: 10:00 am - 11:00 am   Participation Level: Minimal   Behavioral Response: CasualAlertDepressed   Type of Therapy: Group Therapy   Treatment Goals addressed: Coping   Progress Towards Goals: Progressing   Interventions: CBT, DBT, Solution Focused, Strength-based, Supportive, and Reframing   Therapist Response: [name] led processing group for pt's current struggles. Group members shared stressors and provided support and feedback. [name] brought in topics of boundaries, healthy relationships, and unhealthy thought processes to inform discussion.    Therapist Response: Pt able to process and provide support to group.            Session Time: 11:00 -12:00   Participation Level: Minimal   Behavioral Response: CasualAlertDepressed   Type of Therapy: Group Therapy   Treatment Goals addressed: Coping   Progress Towards Goals: Progressing   Interventions: CBT, DBT, Solution Focused, Strength-based, Supportive, and Reframing   Summary: [name] led discussion on rest. [name] discussed the need to rewrite social story of rest being earned or last on the list and assert that rest is productive. Group members discussed barriers to allowing themselves rest and the negative self-talk involved.  [name] worked with group to thought challenge and apply self-coaching strategies.   Therapist Response: Pt engaged in discussion and reports struggle with allowing themselves rest.          Session Time: 12:00 -1:00  Participation Level: Minimal   Behavioral Response: CasualAlertDepressed   Type of Therapy: Group therapy   Treatment Goals addressed: Coping   Progress Towards Goals: Progressing   Interventions: OT group   Summary: 12:00 - 12:50:  [name].  12:50 - 1:00 Clinician assessed for immediate needs, medication compliance and efficacy, and safety concerns.   Therapist Response: 12:00 - 12:50: Pt participated 12:50 - 1:00 pm: At check-out, patient reports no immediate concerns. Patient demonstrates progress as evidenced by continued engagement and responsiveness to treatment. Patient denies SI/HI/self-harm thoughts at the end of group.    Suicidal/Homicidal: Yeswithout intent/plan  Plan: Pt will continue in PHP while working to decrease depression symptoms, increase daily functioning, and increase ability to manage symptoms in a healthy manner.   Collaboration of Care: Medication Management AEB C Cosby, MD  Patient/Guardian was advised Release of Information must be obtained prior to any record release in order to collaborate their care with an outside provider. Patient/Guardian was advised if they have not already done so to contact the registration department to sign all necessary forms in order for [place] to release information regarding their care.   Consent: Patient/Guardian gives verbal consent for treatment and assignment of benefits for services provided during this visit. Patient/Guardian expressed understanding and agreed to proceed.   Diagnosis: Severe episode of recurrent major depressive disorder, without psychotic features (HCC) [F33.2]    1. Severe episode of recurrent major depressive disorder, without psychotic features (HCC)       Donia Guiles, LCSW

## 2023-05-29 NOTE — Progress Notes (Signed)
Spoke with patient via Teams video call, used 2 identifiers to correctly identify patient. States that groups are fine but they are not for him. Does not do well in a group setting and is looking forward to doing individual therapy on Monday. Had a set back when his father passed away last June 30, 2023. His funeral is Sunday. He had been sick but it was still very difficult. On scale 1-10 as 10 being worst he rates depression at 10 and anxiety at 6. Denies SI/HI or AV hallucinations. PHQ9=25. No side effects from medication. No issue or complaint.

## 2023-05-29 NOTE — Telephone Encounter (Signed)
Last injection 04/15/23.   Dr. Denyse Amass, OK to place order for repeat injection? Order pending.

## 2023-05-29 NOTE — Psych (Signed)
Virtual Visit via Video Note  I connected with Phillip Gill on 05/23/23 at  9:00 AM EST by a video enabled telemedicine application and verified that I am speaking with the correct person using two identifiers.  Location: Patient: patient home Provider: clinical home office   I discussed the limitations of evaluation and management by telemedicine and the availability of in person appointments. The patient expressed understanding and agreed to proceed.  I discussed the assessment and treatment plan with the patient. The patient was provided an opportunity to ask questions and all were answered. The patient agreed with the plan and demonstrated an understanding of the instructions.   The patient was advised to call back or seek an in-person evaluation if the symptoms worsen or if the condition fails to improve as anticipated.  Pt was provided 150 minutes of non-face-to-face time during this encounter.   Donia Guiles, LCSW   [facility] PHP THERAPIST PROGRESS NOTE  Phillip Gill [id number]  Session Time: 9:00 - 10:00  Participation Level: Minimal  Behavioral Response: CasualAlertDepressed  Type of Therapy: Group Therapy  Treatment Goals addressed: Coping  Progress Towards Goals: Initial  Interventions: CBT, DBT, Supportive, and Reframing  Summary: Phillip Gill is a 47 y.o. male who presents with depression symptoms.  Clinician led check-in regarding current stressors and situation, and review of patient completed daily inventory. Clinician utilized active listening and empathetic response and validated patient emotions. Clinician facilitated processing group on pertinent issues.?    Therapist Response:  Patient arrived within time allowed. Patient rates his mood at a 1 on a scale of 1-10 with 10 being best. Pt states he feels "frustrated." Pt states he slept  broken hours and ate 2x. Pt reports annoyance throughout his day due to an issue with his aunt losing her  remote. Pt states it happens multiple times/day and he is blamed for it even though he does not go in her room and then has to help find it. Pt states it is a stressor for him currently.  Pt reports chronic passive SI and denies plan/intent.         Session Time: 10:00 am - 11:00 am   Participation Level: Minimal   Behavioral Response: CasualAlertDepressed   Type of Therapy: Group Therapy   Treatment Goals addressed: Coping   Progress Towards Goals: Progressing   Interventions: CBT, DBT, Solution Focused, Strength-based, Supportive, and Reframing   Therapist Response: [name] introduced topic of CBT cognitive distortions. [name] discussed unhealthy thought patterns and how our thoughts shape our reality and irrational thoughts can alter our perspective. [name] utilized handout "cognitive distortions" to discuss common examples of distorted thoughts and group members worked to identify examples in their own life.    Therapist Response: Pt engaged in discussion and is able to determine examples of distorted thinking in their own life.            Session Time: 11:00 -12:00   Participation Level: Minimal   Behavioral Response: CasualAlertDepressed   Type of Therapy: Group Therapy   Treatment Goals addressed: Coping   Progress Towards Goals: Progressing   Interventions: CBT, DBT, Solution Focused, Strength-based, Supportive, and Reframing   Summary: [name] led discussion on ways to manage stressors and feelings over the weekend. Group members  brainstormed things to do over the weekend for multiple levels of energy, access, and moods. [name] reviewed crisis services should they be needed and provided pt's with the text crisis line, mobile crisis, national suicide hotline, [facility]  24/7 line, and information on [facility].      Therapist Response:  Pt engaged in discussion and is able to identify 3 ideas of what to do over the weekend to keep their mind engaged.    **Pt chose to leave group at  11:30 stating he received a call that his dad had been rushed to the hospital. Pt left to go be with him.    Suicidal/Homicidal: Yeswithout intent/plan  Plan: Pt will continue in PHP while working to decrease depression symptoms, increase daily functioning, and increase ability to manage symptoms in a healthy manner.   Collaboration of Care: Medication Management AEB C Cosby, MD  Patient/Guardian was advised Release of Information must be obtained prior to any record release in order to collaborate their care with an outside provider. Patient/Guardian was advised if they have not already done so to contact the registration department to sign all necessary forms in order for [place] to release information regarding their care.   Consent: Patient/Guardian gives verbal consent for treatment and assignment of benefits for services provided during this visit. Patient/Guardian expressed understanding and agreed to proceed.   Diagnosis: Severe episode of recurrent major depressive disorder, without psychotic features (HCC) [F33.2]    1. Severe episode of recurrent major depressive disorder, without psychotic features (HCC)       Donia Guiles, LCSW

## 2023-05-29 NOTE — Psych (Signed)
Virtual Visit via Video Note  I connected with TAVAREZ AFONSO on 05/23/23 at  9:00 AM EST by a video enabled telemedicine application and verified that I am speaking with the correct person using two identifiers.  Location: Patient: patient home Provider: clinical home office   I discussed the limitations of evaluation and management by telemedicine and the availability of in person appointments. The patient expressed understanding and agreed to proceed.  I discussed the assessment and treatment plan with the patient. The patient was provided an opportunity to ask questions and all were answered. The patient agreed with the plan and demonstrated an understanding of the instructions.   The patient was advised to call back or seek an in-person evaluation if the symptoms worsen or if the condition fails to improve as anticipated.  Pt was provided 240 minutes of non-face-to-face time during this encounter.   Donia Guiles, LCSW   [facility] PHP THERAPIST PROGRESS NOTE  MURICE FINIGAN [id number]  Session Time: 9:00 - 10:00  Participation Level: Minimal  Behavioral Response: CasualAlertDepressed  Type of Therapy: Group Therapy  Treatment Goals addressed: Coping  Progress Towards Goals: Progressing  Interventions: CBT, DBT, Supportive, and Reframing  Summary: DEZJUAN COPUS is a 47 y.o. male who presents with depression symptoms.  Clinician led check-in regarding current stressors and situation, and review of patient completed daily inventory. Clinician utilized active listening and empathetic response and validated patient emotions. Clinician facilitated processing group on pertinent issues.?    Therapist Response:  Patient arrived within time allowed. Patient rates his mood at a 2 on a scale of 1-10 with 10 being best. Pt states he feels "not too good." Pt states he slept 7 broken hours and ate 2x. Pt reports didn't want to do anything and lay in bed most of the day. Pt  reports low energy and motivation. Pt unable to identify barriers to completing a task/coping skill. Pt continues to be reserved and respond in predominately one word answers. Pt reports hopelessness and chronic passive SI and denies plan/intent.         Session Time: 10:00 am - 11:00 am   Participation Level: Minimal   Behavioral Response: CasualAlertDepressed   Type of Therapy: Group Therapy   Treatment Goals addressed: Coping   Progress Towards Goals: Progressing   Interventions: CBT, DBT, Solution Focused, Strength-based, Supportive, and Reframing   Therapist Response: [name] led discussion on "work arounds" or finding intermediate solutions while we are in the process of working on a final solution. Group members shared situations in which they are struggling with an issue that they are unable to fix quickly. [name] encouraged pt's to consider ways to make things "doable" and not let "perfect" stand in the way.    Therapist Response: Pt engaged in discussion and is able to connect with topic.           Session Time: 11:00 -12:00   Participation Level: Minimal   Behavioral Response: CasualAlertDepressed   Type of Therapy: Group Therapy   Treatment Goals addressed: Coping   Progress Towards Goals: Progressing   Interventions: CBT, DBT, Solution Focused, Strength-based, Supportive, and Reframing   Summary: [name] introduced CBT and the way in which it can provide context for addressing stumbling blocks. Group discussed "the problem is not the problem, the problem is how we're thinking about the problem" and tried to change perspective on current struggles.    Therapist Response: Pt engaged in discussion and is able to attempt reframing using  CBT.          Session Time: 12:00 -1:00   Participation Level: Minimal   Behavioral Response: CasualAlertDepressed   Type of Therapy: Group therapy   Treatment Goals addressed: Coping   Progress Towards Goals: Progressing    Interventions: OT group   Summary: 12:00 - 12:50: [name].  12:50 - 1:00 Clinician assessed for immediate needs, medication compliance and efficacy, and safety concerns.   Therapist Response: 12:00 - 12:50: Pt participated 12:50 - 1:00 pm: At check-out, patient reports no immediate concerns. Patient demonstrates progress as evidenced by continued engagement and responsiveness to treatment. Patient denies SI/HI/self-harm thoughts at the end of group.    Suicidal/Homicidal: Yeswithout intent/plan  Plan: Pt will continue in PHP while working to decrease depression symptoms, increase daily functioning, and increase ability to manage symptoms in a healthy manner.   Collaboration of Care: Medication Management AEB C Cosby, MD  Patient/Guardian was advised Release of Information must be obtained prior to any record release in order to collaborate their care with an outside provider. Patient/Guardian was advised if they have not already done so to contact the registration department to sign all necessary forms in order for [place] to release information regarding their care.   Consent: Patient/Guardian gives verbal consent for treatment and assignment of benefits for services provided during this visit. Patient/Guardian expressed understanding and agreed to proceed.   Diagnosis: Severe episode of recurrent major depressive disorder, without psychotic features (HCC) [F33.2]    1. Severe episode of recurrent major depressive disorder, without psychotic features (HCC)       Donia Guiles, LCSW

## 2023-06-03 ENCOUNTER — Ambulatory Visit (INDEPENDENT_AMBULATORY_CARE_PROVIDER_SITE_OTHER): Payer: Medicaid Other | Admitting: Licensed Clinical Social Worker

## 2023-06-03 ENCOUNTER — Other Ambulatory Visit: Payer: Self-pay

## 2023-06-03 ENCOUNTER — Encounter (HOSPITAL_COMMUNITY): Payer: Self-pay

## 2023-06-03 DIAGNOSIS — F332 Major depressive disorder, recurrent severe without psychotic features: Secondary | ICD-10-CM | POA: Diagnosis not present

## 2023-06-03 DIAGNOSIS — F411 Generalized anxiety disorder: Secondary | ICD-10-CM | POA: Diagnosis not present

## 2023-06-03 NOTE — Progress Notes (Signed)
THERAPIST PROGRESS NOTE   Session Date: 06/03/2023  Session Time: 1308 - 1349  Participation Level: Active  Behavioral Response: GuardedAlertAnxious and Depressed  Type of Therapy: Individual Therapy  Treatment Goals addressed:  - LTG: Reduce frequency, intensity, and duration of depression symptoms so that daily functioning is improved (OP Depression) - LTG: Increase coping skills to manage depression and improve ability to perform daily activities (OP Depression) - LTG: "To not be depressed, have more energy, and stop over eating" (OP Depression) - STG: Report a decrease in anxiety symptoms as evidenced by an overall reduction in anxiety score by a minimum of 25% on the Generalized Anxiety Disorder Scale (GAD-7) (Anxiety) - STG: Eithen "Shan" will reduce frequency of avoidant behaviors by 50% as evidenced by self-report in therapy sessions (Anxiety) - LTG: "Be a more people person" Increase frequency at which pt is actively engaging in social activities. (Anxiety)  ProgressTowards Goals: Initial  Interventions: CBT, Solution Focused, and Supportive  Summary: Phillip Gill is a 47 y.o. male with past psych history of MDD and GAD, presenting for initial therapy session in efforts to improve management of depressive and anxious symptoms. Patient actively engaged in session, engaging in brief introductory discussions surrounding prior tx hx and identified areas for work. Pt shared the recent passing of his father on 11/22 proving to be a significant stressor at this time. Patient responded well to interventions. Patient continues to meet criteria for MDD and GAD . Patient will continue to benefit from engagement in outpatient therapy due to being the least restrictive service to meet presenting needs.      06/03/2023    1:26 PM 03/05/2023   11:08 AM 02/05/2023   10:46 AM  GAD 7 : Generalized Anxiety Score  Nervous, Anxious, on Edge 3 3 3   Control/stop worrying 3 3 3   Worry too much - different  things 3 3 3   Trouble relaxing 3 3 3   Restless 3 3 3   Easily annoyed or irritable 3 3 3   Afraid - awful might happen 3 3 3   Total GAD 7 Score 21 21 21   Anxiety Difficulty Extremely difficult Extremely difficult       05/29/2023   11:13 AM 05/22/2023    9:26 AM 05/09/2023    1:46 PM 04/01/2023    9:23 AM 03/05/2023   11:08 AM  Depression screen PHQ 2/9  Decreased Interest 3 3 3  0 3  Down, Depressed, Hopeless 3 3 3 1 3   PHQ - 2 Score 6 6 6 1 6   Altered sleeping 3 3 3  3   Tired, decreased energy 3 3 3  3   Change in appetite 3 3 3  3   Feeling bad or failure about yourself  3 3 3  3   Trouble concentrating 3 3 3  3   Moving slowly or fidgety/restless 3 2 2  3   Suicidal thoughts 1 1 2  3   PHQ-9 Score 25 24 25  27   Difficult doing work/chores Extremely dIfficult Extremely dIfficult Extremely dIfficult  Extremely dIfficult   Flowsheet Row Counselor from 05/29/2023 in [facility] Counselor from 05/15/2023 in [facility] Counselor from 05/09/2023 in [facility]  C-SSRS RISK CATEGORY Error: Q3, 4, or 5 should not be populated when Q2 is No Error: Q3, 4, or 5 should not be populated when Q2 is No Moderate Risk       Suicidal/Homicidal: No.  Therapist Response: Clinician utilized CBT, solution focused, and supportive reflection  techniques to support pt in developing tx plan, identifying individualized tx goals for future course of tx. Therapist provided support and empathy to patient during session.  Plan: Return again in 2 weeks.  Diagnosis:  Encounter Diagnoses  Name Primary?   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  Patient/Guardian was advised Release of Information must be obtained prior to any record release in order to collaborate their care with an outside  provider. Patient/Guardian was advised if they have not already done so to contact the registration department to sign all necessary forms in order for [place] to release information regarding their care.   Consent: Patient/Guardian gives verbal consent for treatment and assignment of benefits for services provided during this visit. Patient/Guardian expressed understanding and agreed to proceed.   Leisa Lenz, MSW, LCSW 06/03/2023,  1:52 PM

## 2023-06-05 ENCOUNTER — Other Ambulatory Visit: Payer: Self-pay

## 2023-06-05 NOTE — Progress Notes (Signed)
Specialty Pharmacy Refill Coordination Note  BRIDGER REDDIC is a 47 y.o. male contacted today regarding refills of specialty medication(s) Bictegravir-Emtricitab-Tenofov   Patient requested Delivery   Delivery date: 06/12/23   Verified address: [address]   Medication will be filled on 06/11/23.

## 2023-06-05 NOTE — Progress Notes (Signed)
Specialty Pharmacy Ongoing Clinical Assessment Note  Phillip Gill is a 47 y.o. male who is being followed by the specialty pharmacy service for RxSp HIV   Patient's specialty medication(s) reviewed today: Bictegravir-Emtricitab-Tenofov   Missed doses in the last 4 weeks: 0   Patient/Caregiver did not have any additional questions or concerns.   Therapeutic benefit summary: Patient is achieving benefit   Adverse events/side effects summary: No adverse events/side effects   Patient's therapy is appropriate to: Continue    Goals Addressed             This Visit's Progress    Achieve Undetectable HIV Viral Load < 20       Patient is on track. Patient will maintain adherence         Follow up:  3 months  Bobette Mo Specialty Pharmacist

## 2023-06-06 ENCOUNTER — Other Ambulatory Visit: Payer: Self-pay | Admitting: Family

## 2023-06-06 ENCOUNTER — Other Ambulatory Visit (HOSPITAL_COMMUNITY): Payer: Self-pay

## 2023-06-06 ENCOUNTER — Encounter (HOSPITAL_COMMUNITY): Payer: Self-pay

## 2023-06-09 NOTE — Progress Notes (Unsigned)
Psychiatric Initial Adult Assessment  Patient Identification: Phillip Gill MRN:  [id number] Date of Evaluation:  06/09/2023 Referral Source: [name], MD  Assessment:  Phillip Gill is a 47 y.o. male with a history of MDD, GAD, panic attacks, HIV on HAART, OSA not on CPAP, chronic hepatitis B, anal condyloma, GERD, hypertension who presents in person to [facility] for medication management.  Patient reports significant depressive symptoms and anxiety with associated panic attacks.  He has found Lexapro and Abilify mildly improve his mood symptoms however for he still remains significantly depressed.  He has been unemployed since January 2024 after a breakdown and has been on a ball to return to work.  Due to the severity of his mental health symptoms and the impact it has had on his daily living, we discussed referral to [facility] which he was amenable to.  In addition, given his significant neurovegetative symptoms that appear related to his MDD, we elected to start Wellbutrin XL 150 mg daily as well as Inderal 10 mg twice daily as needed for social anxiety.  Will obtain a vitamin D level to identify if there are any other factors that would be contributing to his depressive symptoms.  Plan:  # Major Depressive Disorder, recurrent episode Past medication trials:  Status of problem: active Interventions: -- Referral for PHP  -- Recommend psychotherapy  -- Continue lexapro 20 milligrams daily -- Continue abilify 5 milligrams daily  -A1c ordered, lipid panel wnl  - ECG 01/2022 Qtc 413 --Start bupropion XL 150 mg daily  -No history of seizures  # Generalized anxiety disorder with panic attack Past medication trials:  Status of problem: Active Interventions: -- Lexapro as above -- Abilify as above --Start propranolol 10 mg twice daily as needed for anxiety  Return to care in 1 month  Patient was given contact information for behavioral health clinic and  was instructed to call 911 for emergencies.    Patient and plan of care will be discussed with the Attending MD, Dr. [name], who agrees with the above statement and plan.   Subjective:  Chief Complaint: Medication Management  History of Present Illness:   Patient reports presenting to psychiatry today due to severe depressive symptoms and anxiety with panic attacks.  He reports having a history of major depression as well as anxiety dating back to 1998 following grandmother's death.  He noticed he was having panic attacks almost daily which has slowly over the years decrease in frequency to only situations with very high anxiety.  He reports significant history of depression that seems to have worsened over the past few months.  He states symptoms of depressed mood, anhedonia, hypersomnia (10 to 15 hours of sleep), poor energy, poor concentration, increased appetite (30 pound weight gain in past 2 months), and passive suicidal ideation.  He denies ever having active suicidal ideation besides 1 night after grandmother's death to which she had been thinking about location and method but grandmother came to him in a dream and told him to live which has since resulted in him never having further suicidal ideation.  He endorses significant panic attacks especially in setting of elevated anxiety.  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  He describes panic attacks as episodes of dyspnea, numbness in arms and legs, chest pain, and heat intolerance.  He reports feeling has he has been unable to return to work due to his medical problems as well as  mental health problems.  He states he has been dealing with multiple psychosocial stressors including father being very sick, personal finance problems, lack of transportation, grandmother's death in 79 and mother's passing in 2018.  He has been living between 2 aunts and his brother's house.  He denies history of psychosis.   Denies history of mania.  He denies history of trauma/abuse.  He reports significant alcohol use when he was college but has not drank significantly since then.  He states he drinks either a couple shots of liquor or 2-3 beers once every few months.  He denies any illicit substance use.  He formally smoked cigarettes and vape nicotine but has quit for some time now.  He denies marijuana/cannabis use.   Past Psychiatric History:  Diagnoses: depression and anxiety Medication trials: Lexapro, elavil (worsening pain), xanax, bupropion, celexa, valium, duloxetine (GI distress), hydroxyzine, propranolol, risperidone, ambien, gabapentin (hives) Previous psychiatrist/therapist: [facility], Dr. Flora Lipps (decade ago), therapist in the past  Hospitalizations: denies Suicide attempts: denies SIB: denies Hx of violence towards others: denies Current access to guns: denies Hx of trauma/abuse: denies  Substance Abuse History in the last 12 months:  No.  Past Medical History:  Past Medical History:  Diagnosis Date   Anxiety    Arthritis    "neck" (02/16/2015)   Chronic hepatitis B (HCC)    SECONDARY TO HIV   Chronic lower back pain    Depression    Fibromyalgia    Genital warts    HIV disease (HCC) 02/28/2015   HIV infection (HCC)    followed by Dr. Luciana Axe- sees him every 4 months   Hypertension    IBS (irritable bowel syndrome)    Migraine    "none in years" (02/16/2015   Overweight 07/20/2015   Renal insufficiency 12/29/2013    Past Surgical History:  Procedure Laterality Date   CO2 LASER APPLICATION N/A 02/11/2013   Procedure: CO2 LASER APPLICATION;  Surgeon: Romie Levee, MD;  Location: [facility];  Service: General;  Laterality: N/A;   FOOT SURGERY     HIGH RESOLUTION ANOSCOPY N/A 02/11/2013   Procedure: HIGH RESOLUTION ANOSCOPY WITH BIOPSY, LASER ABLATION;  Surgeon: Romie Levee, MD;  Location: [name];  Service: General;   Laterality: N/A;   WISDOM TOOTH EXTRACTION      Family Psychiatric History:  denies  Family History:  Family History  Problem Relation Age of Onset   Hypertension Mother    Diabetes Mother    Stroke Mother        cerbral aneurysm   Pancreatic cancer Mother    Colon cancer Father    Hypertension Brother    Sleep apnea Cousin    Mental illness Neg Hx    Stomach cancer Neg Hx    Esophageal cancer Neg Hx     Social History:   Academic/Vocational: Unemployed Social History   Socioeconomic History   Marital status: Single    Spouse name: Not on file   Number of children: Not on file   Years of education: Not on file   Highest education level: Not on file  Occupational History   Not on file  Tobacco Use   Smoking status: Former    Current packs/day: 0.00    Average packs/day: 1 pack/day for 18.0 years (18.0 ttl pk-yrs)    Types: Cigarettes    Start date: 07/03/2003    Quit date: 07/02/2021    Years since quitting: 1.9  Passive exposure: Never   Smokeless tobacco: Never   Tobacco comments:    Pt vapes everyday  Vaping Use   Vaping status: Former   Quit date: 12/01/2022   Substances: Nicotine  Substance and Sexual Activity   Alcohol use: Yes    Comment: occasional use   Drug use: No   Sexual activity: Not on file    Comment: accepted condoms  Other Topics Concern   Not on file  Social History Narrative   Grew up in [place], now living in [place],-  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    Finished HS.   Doing online classes with [employer]- studying accounting   Previously 2 years of classes at [employer].    Has Partner- [name]- together for 1 year.    Has 2 girls- (born in 2000).          Social Determinants of Health   Financial Resource Strain: High Risk (02/05/2023)   Overall Financial Resource Strain (CARDIA)    Difficulty of Paying Living Expenses: Very hard  Food Insecurity: Food Insecurity Present (02/05/2023)   Hunger Vital Sign     Worried About Running Out of Food in the Last Year: Sometimes true    Ran Out of Food in the Last Year: Sometimes true  Transportation Needs: Unmet Transportation Needs (02/05/2023)   PRAPARE - Administrator, Civil Service (Medical): Yes    Lack of Transportation (Non-Medical): Yes  Physical Activity: Sufficiently Active (07/16/2018)   [facility], [facility]   Exercise Vital Sign    Days of Exercise per Week: 4 days    Minutes of Exercise per Session: 150+ min  Stress: Stress Concern Present (03/01/2023)   [employer] of Occupational Health - Occupational Stress Questionnaire    Feeling of Stress : To some extent  Social Connections: Unknown (05/23/2022)   [facility] from [facility], [facility]   Social Network    Social Network: Not on file    Additional Social History: updated  Allergies:   Allergies  Allergen Reactions   Bactrim [Sulfamethoxazole W/Trimethoprim (Co-Trimoxazole)] Hives and Shortness Of Breath   Sulfa Antibiotics Hives and Shortness Of Breath   Cymbalta [Duloxetine Hcl] Diarrhea   Duloxetine Diarrhea   Other Hives and Swelling    [employer] toothpaste    Zoloft [Sertraline Hcl] Diarrhea   Clindamycin/Lincomycin Rash   Neurontin [Gabapentin] Rash    Current Medications: Current Outpatient Medications  Medication Sig Dispense Refill   amLODipine (NORVASC) 10 MG tablet Take 1 tablet (10 mg total) by mouth daily. 90 tablet 1   ARIPiprazole (ABILIFY) 5 MG tablet Take 1 tablet (5 mg total) by mouth daily. 90 tablet 0   bictegravir-emtricitabine-tenofovir AF (BIKTARVY) 50-200-25 MG TABS tablet TAKE 1 TABLET BY MOUTH EVERY DAY 30 tablet 5   buPROPion (WELLBUTRIN XL) 150 MG 24 hr tablet Take 1 tablet (150 mg total) by mouth daily. 30 tablet 0   diclofenac Sodium (VOLTAREN) 1 % GEL Apply 4 grams topically 4 (four) times daily to affected joint. 100 g 11   escitalopram (LEXAPRO) 20 MG tablet Take  1 tablet (20 mg total) by mouth daily. 30 tablet 5   esomeprazole (NEXIUM) 40 MG capsule Take 1 capsule (40 mg total) by mouth daily before breakfast. 90 capsule 0   famotidine (PEPCID) 40 MG tablet Take 1 tablet (40 mg total) by mouth at bedtime. 90 tablet 0   finasteride (PROSCAR) 5 MG tablet  Take 1 tablet (5 mg total) by mouth daily. 30 tablet 1   polyethylene glycol powder (GLYCOLAX/MIRALAX) 17 GM/SCOOP powder Dissolve 17 grams in liquid and drink by mouth every day. 476 g 0   propranolol (INDERAL) 10 MG tablet Take 1 tablet (10 mg total) by mouth 2 (two) times daily as needed. 60 tablet 0   tadalafil (CIALIS) 10 MG tablet Take 0.5 tablets (5 mg total) by mouth daily. 45 tablet 0   No current facility-administered medications for this visit.    ROS: Review of Systems   Objective:  Psychiatric Specialty Exam: There were no vitals taken for this visit.There is no height or weight on file to calculate BMI.  General Appearance: Casual  Eye Contact:  Fair  Speech:  Clear and Coherent and Normal Rate  Volume:  Decreased  Mood:  Anxious  Affect:  Appropriate and Congruent  Thought Content: Logical   Suicidal Thoughts:  No  Homicidal Thoughts:  No  Thought Process:  Coherent, Goal Directed, and Linear  Orientation:  Full (Time, Place, and Person)    Memory: Recent;   Fair  Judgment:  Intact  Insight:  Fair  Concentration:  Attention Span: Fair  Recall:  not formally assessed   Fund of Knowledge: Fair  Language: Fair  Psychomotor Activity:  Normal  Akathisia:  No  AIMS (if indicated): not done  Assets:  Manufacturing systems engineer Desire for Improvement Financial Resources/Insurance Housing Social Support Transportation  ADL's:  Intact  Cognition: WNL  Sleep:  Good   PE: General: well-appearing; no acute distress  Pulm: no increased work of breathing on room air  Strength & Muscle Tone: within normal limits Neuro: no focal neurological deficits observed  Gait & Station:  normal  Metabolic Disorder Labs: Lab Results  Component Value Date   HGBA1C 5.5 10/02/2014   MPG 111 10/02/2014   No results found for: "PROLACTIN" Lab Results  Component Value Date   CHOL 143 10/23/2022   TRIG 127 10/23/2022   HDL 30 (L) 10/23/2022   CHOLHDL 4.8 10/23/2022   VLDL 16 02/14/2015   LDLCALC 91 10/23/2022   LDLCALC 106 (H) 05/14/2022   No results found for: "TSH"  Therapeutic Level Labs: No results found for: "LITHIUM" No results found for: "CBMZ" No results found for: "VALPROATE"  Screenings:  GAD-7    Flowsheet Row Counselor from 06/03/2023 in [facility] Visit from 03/05/2023 in [facility] [employer] Visit from 02/05/2023 in [facility] [employer]  Total GAD-7 Score 21 21 21       PHQ2-9    Flowsheet Row Counselor from 05/29/2023 in [facility] Counselor from 05/15/2023 in [facility] Counselor from 05/09/2023 in [facility] Office Visit from 04/01/2023 in [facility] [place] Office Visit from 03/05/2023 in [facility] Total Score 6 6 6 1 6   PHQ-9 Total Score 25 24 25  -- 27      Flowsheet Row Counselor from 05/29/2023 in [facility] Counselor from 05/15/2023 in [facility] Counselor from 05/09/2023 in [facility]  C-SSRS RISK CATEGORY Error: Q3, 4, or 5 should not be populated when Q2 is No Error: Q3, 4, or 5 should not be populated when Q2 is No Moderate Risk  Collaboration of Care: Collaboration of Care:   Patient/Guardian was advised Release of Information must be obtained prior to any record release in order to collaborate their care with an outside provider.  Patient/Guardian was advised if they have not already done so to contact the registration department to sign all necessary forms in order for [place] to release information regarding their care.   Consent: Patient/Guardian gives verbal consent for treatment and assignment of benefits for services provided during this visit. Patient/Guardian expressed understanding and agreed to proceed.   Park Pope, MD 12/8/20246:12 PM

## 2023-06-10 ENCOUNTER — Other Ambulatory Visit: Payer: Self-pay

## 2023-06-10 ENCOUNTER — Ambulatory Visit (HOSPITAL_COMMUNITY): Payer: Medicaid Other | Admitting: Student

## 2023-06-11 ENCOUNTER — Other Ambulatory Visit: Payer: Self-pay

## 2023-06-13 ENCOUNTER — Encounter: Payer: Self-pay | Admitting: Internal Medicine

## 2023-06-13 ENCOUNTER — Other Ambulatory Visit: Payer: Self-pay

## 2023-06-13 ENCOUNTER — Ambulatory Visit: Payer: Medicaid Other | Admitting: Neurology

## 2023-06-13 ENCOUNTER — Ambulatory Visit: Payer: Medicaid Other | Admitting: Internal Medicine

## 2023-06-13 VITALS — BP 138/88 | HR 73 | Temp 98.6°F | Resp 14 | Ht 72.0 in | Wt 241.0 lb

## 2023-06-13 DIAGNOSIS — R1310 Dysphagia, unspecified: Secondary | ICD-10-CM

## 2023-06-13 DIAGNOSIS — K2100 Gastro-esophageal reflux disease with esophagitis, without bleeding: Secondary | ICD-10-CM

## 2023-06-13 DIAGNOSIS — A630 Anogenital (venereal) warts: Secondary | ICD-10-CM

## 2023-06-13 DIAGNOSIS — K219 Gastro-esophageal reflux disease without esophagitis: Secondary | ICD-10-CM

## 2023-06-13 DIAGNOSIS — Z1211 Encounter for screening for malignant neoplasm of colon: Secondary | ICD-10-CM

## 2023-06-13 DIAGNOSIS — K449 Diaphragmatic hernia without obstruction or gangrene: Secondary | ICD-10-CM | POA: Diagnosis not present

## 2023-06-13 DIAGNOSIS — K5909 Other constipation: Secondary | ICD-10-CM

## 2023-06-13 MED ORDER — ESOMEPRAZOLE MAGNESIUM 40 MG PO CPDR
40.0000 mg | DELAYED_RELEASE_CAPSULE | Freq: Every day | ORAL | 11 refills | Status: DC
  Filled 2023-06-13 – 2023-06-20 (×8): qty 60, 60d supply, fill #0

## 2023-06-13 MED ORDER — SODIUM CHLORIDE 0.9 % IV SOLN: 500.0000 mL | INTRAVENOUS | Status: DC

## 2023-06-13 MED ORDER — ESOMEPRAZOLE MAGNESIUM 40 MG PO CPDR
40.0000 mg | DELAYED_RELEASE_CAPSULE | Freq: Every day | ORAL | 0 refills | Status: DC
  Filled 2023-06-13 – 2023-08-24 (×4): qty 90, 90d supply, fill #0

## 2023-06-13 NOTE — Progress Notes (Signed)
Pt's states no medical or surgical changes since previsit or office visit. 

## 2023-06-13 NOTE — Progress Notes (Signed)
Report to PACU, RN, vss, BBS= Clear.  

## 2023-06-13 NOTE — Op Note (Signed)
[facility] Patient Name: Phillip Gill Procedure Date: 06/13/2023 3:15 PM MRN: [id number] Endoscopist: Wilhemina Bonito. Marina Goodell , MD, [id number] Age: 47 Referring MD:  Date of Birth: 1975-11-21 Gender: Male Account #: [id number] Procedure:                Upper GI endoscopy Indications:              Dysphagia, Esophageal reflux Medicines:                Monitored Anesthesia Care Procedure:                Pre-Anesthesia Assessment:                           - Prior to the procedure, a History and Physical                            was performed, and patient medications and                            allergies were reviewed. The patient's tolerance of                            previous anesthesia was also reviewed. The risks                            and benefits of the procedure and the sedation                            options and risks were discussed with the patient.                            All questions were answered, and informed consent                            was obtained. Prior Anticoagulants: The patient has                            taken no anticoagulant or antiplatelet agents. ASA                            Grade Assessment: II - A patient with mild systemic                            disease. After reviewing the risks and benefits,                            the patient was deemed in satisfactory condition to                            undergo the procedure.                           After obtaining informed consent, the endoscope was  passed under direct vision. Throughout the                            procedure, the patient's blood pressure, pulse, and                            oxygen saturations were monitored continuously. The                            GIF [id number] #[id number] was introduced through the                            mouth, and advanced to the second part of duodenum.                            The upper GI endoscopy  was accomplished without                            difficulty. The patient tolerated the procedure                            well. Scope In: Scope Out: Findings:                 The esophagus revealed mild reflux esophagitis as                            manifested by minor friability, erythema, and                            edema. No Barrett's. No stricture.                           The stomach was normal except for small hiatal                            hernia.                           The examined duodenum was normal.                           The cardia and gastric fundus were normal on                            retroflexion. Complications:            No immediate complications. Estimated Blood Loss:     Estimated blood loss: none. Impression:               1. GERD with esophagitis                           2. Small hiatal hernia. Otherwise normal EGD. Recommendation:           - Patient has a contact number available for  emergencies. The signs and symptoms of potential                            delayed complications were discussed with the                            patient. Return to normal activities tomorrow.                            Written discharge instructions were provided to the                            patient.                           - Resume previous diet.                           - Continue present medications.                           -Prescribe Nexium 40 mg p.o. twice daily; #60; 11                            refills                           -Resume general medical care with your PCP Wilhemina Bonito. Marina Goodell, MD 06/13/2023 3:45:15 PM This report has been signed electronically.

## 2023-06-13 NOTE — Patient Instructions (Addendum)
Resume previous diet Continue present medications, Take Nexium 40 mg twice daily, pick up at [facility]  There were no colon polyps seen today!  You will need another screening colonoscopy in 10 years, you will receive a letter at that time when you are due for the procedure.   Please call us at [phone] if you have a change in bowel habits, change in family history of colo-rectal cancer, rectal bleeding or other GI concern before that time. Resume general medical care with your primary care physician (PCP)  Handouts/information given for esophagitis, GERD, Hiatal hernia and Nexium  YOU HAD AN ENDOSCOPIC PROCEDURE TODAY AT THE [facility]:   Refer to the procedure report that was given to you for any specific questions about what was found during the examination.  If the procedure report does not answer your questions, please call your gastroenterologist to clarify.  If you requested that your care partner not be given the details of your procedure findings, then the procedure report has been included in a sealed envelope for you to review at your convenience later.  YOU SHOULD EXPECT: Some feelings of bloating in the abdomen. Passage of more gas than usual.  Walking can help get rid of the air that was put into your GI tract during the procedure and reduce the bloating. If you had a lower endoscopy (such as a colonoscopy or flexible sigmoidoscopy) you may notice spotting of blood in your stool or on the toilet paper. If you underwent a bowel prep for your procedure, you may not have a normal bowel movement for a few days.  Please Note:  You might notice some irritation and congestion in your nose or some drainage.  This is from the oxygen used during your procedure.  There is no need for concern and it should clear up in a day or so.  SYMPTOMS TO REPORT IMMEDIATELY:  Following lower endoscopy (colonoscopy):  Excessive amounts of blood in the stool  Significant  tenderness or worsening of abdominal pains  Swelling of the abdomen that is new, acute  Fever of 100F or higher Following upper endoscopy (EGD)  Vomiting of blood or coffee ground material  New chest pain or pain under the shoulder blades  Painful or persistently difficult swallowing  New shortness of breath  Black, tarry-looking stools For urgent or emergent issues, a gastroenterologist can be reached at any hour by calling [phone]. Do not use MyChart messaging for urgent concerns.   DIET:  We do recommend a small meal at first, but then you may proceed to your regular diet.  Drink plenty of fluids but you should avoid alcoholic beverages for 24 hours.  ACTIVITY:  You should plan to take it easy for the rest of today and you should NOT DRIVE or use heavy machinery until tomorrow (because of the sedation medicines used during the test).    FOLLOW UP: Our staff will call the number listed on your records the next business day following your procedure.  We will call around 7:15- 8:00 am to check on you and address any questions or concerns that you may have regarding the information given to you following your procedure. If we do not reach you, we will leave a message.     SIGNATURES/CONFIDENTIALITY: You and/or your care partner have signed paperwork which will be entered into your electronic medical record.  These signatures attest to the fact that that the information above on your After Visit Summary has  been reviewed and is understood.  Full responsibility of the confidentiality of this discharge information lies with you and/or your care-partner.

## 2023-06-13 NOTE — Op Note (Signed)
[facility] Patient Name: Phillip Gill Procedure Date: 06/13/2023 3:06 PM MRN: [id number] Endoscopist: Wilhemina Bonito. Marina Goodell , MD, [id number] Age: 47 Referring MD:  Date of Birth: 1975/10/24 Gender: Male Account #: [id number] Procedure:                Colonoscopy Indications:              Screening for colorectal malignant neoplasm Medicines:                Monitored Anesthesia Care Procedure:                Pre-Anesthesia Assessment:                           - Prior to the procedure, a History and Physical                            was performed, and patient medications and                            allergies were reviewed. The patient's tolerance of                            previous anesthesia was also reviewed. The risks                            and benefits of the procedure and the sedation                            options and risks were discussed with the patient.                            All questions were answered, and informed consent                            was obtained. Prior Anticoagulants: The patient has                            taken no anticoagulant or antiplatelet agents. ASA                            Grade Assessment: II - A patient with mild systemic                            disease. After reviewing the risks and benefits,                            the patient was deemed in satisfactory condition to                            undergo the procedure.                           After obtaining informed consent, the colonoscope  was passed under direct vision. Throughout the                            procedure, the patient's blood pressure, pulse, and                            oxygen saturations were monitored continuously. The                            CF [id number] #[id number] was introduced through the anus                            and advanced to the the cecum, identified by                            appendiceal  orifice and ileocecal valve. The                            terminal ileum, ileocecal valve, appendiceal                            orifice, and rectum were photographed. The quality                            of the bowel preparation was excellent. The                            colonoscopy was performed without difficulty. The                            patient tolerated the procedure well. The bowel                            preparation used was SUPREP via split dose                            instruction. Scope In: 3:24:19 PM Scope Out: 3:32:31 PM Scope Withdrawal Time: 0 hours 5 minutes 55 seconds  Total Procedure Duration: 0 hours 8 minutes 12 seconds  Findings:                 The terminal ileum appeared normal.                           The entire examined colon appeared normal on direct                            and retroflexion views. No condyloma. Complications:            No immediate complications. Estimated blood loss:                            None. Estimated Blood Loss:     Estimated blood loss: none. Impression:               - The examined portion of  the ileum was normal.                           - The entire examined colon is normal on direct and                            retroflexion views.                           - No condyloma. Recommendation:           - Repeat colonoscopy in 10 years for screening                            purposes.                           - Patient has a contact number available for                            emergencies. The signs and symptoms of potential                            delayed complications were discussed with the                            patient. Return to normal activities tomorrow.                            Written discharge instructions were provided to the                            patient.                           - Resume previous diet.                           - Continue present medications.                            - Await pathology results. Wilhemina Bonito. Marina Goodell, MD 06/13/2023 3:37:38 PM This report has been signed electronically.

## 2023-06-13 NOTE — Progress Notes (Signed)
HISTORY OF PRESENT ILLNESS:   Phillip Gill is a 47 y.o. male with multiple medical problems including HIV disease, hepatitis B, hypertension, fibromyalgia, depression, and obesity who was sent today by his primary care provider regarding GERD, constipation, and the need for colonoscopy.  He is followed by ID regarding his HIV disease and hepatitis.  Currently on Biktarvy.   First, the patient states that he has had a 74-month history of constant burning.  Regurgitation at night with saliva on his pillow.  No vomiting.  He was placed on Nexium 40 mg daily.  This helped, but incompletely.  Was told to take Pepcid at night.  He does have some vague pill dysphagia but otherwise swallows well.  No issues with solids or liquids.  No prior GI evaluations.   Patient does have issues with constipation.  MiraLAX has been recommended.  He states that since starting Nexium his bowels have been regular.  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  No prior colon cancer screening.  He does tell me that his father had recent been diagnosed with colon cancer around age 19.   Review of blood work from September 2024 shows unremarkable comprehensive metabolic panel except for glucose of 148.  CBC from November 2023 shows a normal hemoglobin of 13.7.  Abdominal ultrasound from September 2023 was unremarkable.  CT scan from June 2021 revealed some pericolonic edema or thickening in the rectosigmoid region consistent with infectious colitis.   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 Luciana Axe- sees him every 4 months   Hypertension     IBS (irritable bowel syndrome)     Migraine      "none in years" (02/16/2015   Overweight 07/20/2015   Renal insufficiency 12/29/2013               Past Surgical History:  Procedure Laterality Date   CO2 LASER APPLICATION N/A 02/11/2013    Procedure: CO2 LASER APPLICATION;  Surgeon: Romie Levee, MD;  Location: [facility];  Service: General;  Laterality: N/A;   FOOT SURGERY       HIGH RESOLUTION ANOSCOPY N/A 02/11/2013    Procedure: HIGH RESOLUTION ANOSCOPY WITH BIOPSY, LASER ABLATION;  Surgeon: Romie Levee, MD;  Location: [facility];  Service: General;  Laterality: N/A;   WISDOM TOOTH EXTRACTION              Social History Phillip Gill  reports that he quit smoking about 22 months ago. His smoking use included cigarettes. He started smoking about 19 years ago. He has a 18 pack-year smoking history. He has never been exposed to tobacco smoke. He has never used smokeless tobacco. He reports current alcohol use. He reports that he does not use drugs.   family history includes Colon cancer in his father; Diabetes in his mother; Hypertension  in his brother and mother; Pancreatic cancer in his mother; Sleep apnea in his cousin; Stroke in his mother.   Allergies       Allergies  Allergen Reactions   Bactrim [Sulfamethoxazole W/Trimethoprim (Co-Trimoxazole)] Hives and Shortness Of Breath   Sulfa Antibiotics Hives and Shortness Of Breath   Cymbalta [Duloxetine Hcl] Diarrhea   Duloxetine Diarrhea   Other Hives and Swelling      [employer] toothpaste    Zoloft [Sertraline Hcl] Diarrhea   Clindamycin/Lincomycin Rash   Neurontin [Gabapentin] Rash            PHYSICAL EXAMINATION: Vital signs: BP 132/86 (BP Location: Right Arm, Patient Position: Sitting, Cuff Size: Large)   Pulse 88   Ht 6' (1.829 m)   Wt 241 lb (109.3 kg)   SpO2 96%   BMI 32.69 kg/m   Constitutional: generally  well-appearing, no acute distress Psychiatric: alert and oriented x3, cooperative Eyes: extraocular movements intact, anicteric, conjunctiva pink Mouth: oral pharynx moist, no lesions Neck: supple no lymphadenopathy Cardiovascular: heart regular rate and rhythm, no murmur Lungs: clear to auscultation bilaterally Abdomen: soft, nontender, nondistended, no obvious ascites, no peritoneal signs, normal bowel sounds, no organomegaly Rectal: Deferred until colonoscopy Extremities: no clubbing, cyanosis, or lower extremity edema bilaterally Skin: no lesions on visible extremities Neuro: No focal deficits.  Cranial nerves intact   ASSESSMENT:   1.  Chronic GERD.  Incomplete relief with once daily Nexium. 2.  Vague pill dysphagia. 3.  Intermittent problems with constipation.  Improved recently 4.  Family history of colon cancer in father age 61 5.  History of anal warts 6.  Multiple significant medical problems including HIV disease controlled on Biktarvy     PLAN:   1.  Reflux precautions 2.  Increase Nexium to 40 mg twice daily.  Prescribed 3.  Schedule upper endoscopy to evaluate chronic GERD despite PPI and dysphagia. 4.  Schedule colonoscopy for colon cancer screening in a 47 year old with a family history of colon cancer and complaints of constipation 5.  Ongoing general medical care with PCP

## 2023-06-14 ENCOUNTER — Other Ambulatory Visit: Payer: Self-pay

## 2023-06-14 ENCOUNTER — Other Ambulatory Visit (HOSPITAL_COMMUNITY): Payer: Self-pay

## 2023-06-14 ENCOUNTER — Telehealth: Payer: Self-pay

## 2023-06-14 NOTE — Telephone Encounter (Signed)
Follow up call to pt, lm for pt to call if having any difficulty with normal activities or eating and drinking.  Also to call if any other questions or concerns.  

## 2023-06-17 ENCOUNTER — Other Ambulatory Visit (HOSPITAL_COMMUNITY): Payer: Self-pay

## 2023-06-17 ENCOUNTER — Other Ambulatory Visit: Payer: Self-pay

## 2023-06-18 ENCOUNTER — Other Ambulatory Visit: Payer: Self-pay

## 2023-06-19 ENCOUNTER — Encounter: Payer: Self-pay | Admitting: Internal Medicine

## 2023-06-19 ENCOUNTER — Encounter: Payer: Self-pay | Admitting: Family Medicine

## 2023-06-19 ENCOUNTER — Other Ambulatory Visit (HOSPITAL_COMMUNITY): Payer: Self-pay

## 2023-06-20 ENCOUNTER — Ambulatory Visit (INDEPENDENT_AMBULATORY_CARE_PROVIDER_SITE_OTHER): Payer: Medicaid Other | Admitting: Licensed Clinical Social Worker

## 2023-06-20 ENCOUNTER — Other Ambulatory Visit: Payer: Self-pay

## 2023-06-20 ENCOUNTER — Other Ambulatory Visit (HOSPITAL_COMMUNITY): Payer: Self-pay

## 2023-06-20 DIAGNOSIS — F332 Major depressive disorder, recurrent severe without psychotic features: Secondary | ICD-10-CM

## 2023-06-20 DIAGNOSIS — F411 Generalized anxiety disorder: Secondary | ICD-10-CM

## 2023-06-20 MED ORDER — ESOMEPRAZOLE MAGNESIUM 40 MG PO CPDR
40.0000 mg | DELAYED_RELEASE_CAPSULE | Freq: Two times a day (BID) | ORAL | 3 refills | Status: DC
  Filled 2023-06-20 – 2023-06-24 (×5): qty 180, 90d supply, fill #0
  Filled 2023-06-24: qty 60, 30d supply, fill #0
  Filled 2023-08-04: qty 60, 30d supply, fill #1
  Filled 2023-09-19: qty 60, 30d supply, fill #2
  Filled 2023-11-04: qty 60, 30d supply, fill #3
  Filled 2023-11-30: qty 60, 30d supply, fill #4
  Filled 2024-01-27: qty 60, 30d supply, fill #5
  Filled 2024-03-16: qty 60, 30d supply, fill #6
  Filled 2024-03-30 – 2024-05-19 (×4): qty 60, 30d supply, fill #7
  Filled 2024-06-02 – 2024-06-16 (×2): qty 60, 30d supply, fill #8

## 2023-06-20 NOTE — Progress Notes (Signed)
THERAPIST PROGRESS NOTE   Session Date: 06/20/2023  Session Time: 6578 - 0902  Participation Level: Active  Behavioral Response: GuardedAlertAnxious and Depressed  Type of Therapy: Individual Therapy  Treatment Goals addressed:  - LTG: Reduce frequency, intensity, and duration of depression symptoms so that daily functioning is improved (OP Depression) - LTG: Increase coping skills to manage depression and improve ability to perform daily activities (OP Depression) - LTG: "To not be depressed, have more energy, and stop over eating" (OP Depression) - STG: Report a decrease in anxiety symptoms as evidenced by an overall reduction in anxiety score by a minimum of 25% on the Generalized Anxiety Disorder Scale (GAD-7) (Anxiety) - STG: Ashwin "Shan" will reduce frequency of avoidant behaviors by 50% as evidenced by self-report in therapy sessions (Anxiety) - LTG: "Be a more people person" Increase frequency at which pt is actively engaging in social activities. (Anxiety)  ProgressTowards Goals: Initial  Interventions: CBT, Solution Focused, and Supportive  Summary: Phillip Gill is a 47 y.o. male with past psych history of MDD and GAD, presenting for follow up therapy session in efforts to improve management of depressive and anxious symptoms. Patient actively engaged in session, providing recounts of the past two weeks, sharing of various stressors and challenges, specifically citing acid reflux creating additional stress, and the environment within the home and conflict between pt's aunts. Further processed pt's hx of depressive and anxious sxs, unresolved grief and readiness to begin exploring grief surrounding the loss of mother. Actively participated in completion of PHQ9 and GAD7. Patient responded well to interventions. Patient continues to meet criteria for MDD and GAD . Patient will continue to benefit from engagement in outpatient therapy due to being the least restrictive service to meet  presenting needs.      06/20/2023    8:56 AM 06/03/2023    1:26 PM 03/05/2023   11:08 AM 02/05/2023   10:46 AM  GAD 7 : Generalized Anxiety Score  Nervous, Anxious, on Edge 3 3 3 3   Control/stop worrying 3 3 3 3   Worry too much - different things 3 3 3 3   Trouble relaxing 3 3 3 3   Restless 1 3 3 3   Easily annoyed or irritable 3 3 3 3   Afraid - awful might happen 3 3 3 3   Total GAD 7 Score 19 21 21 21   Anxiety Difficulty Extremely difficult Extremely difficult Extremely difficult       06/20/2023    8:58 AM 05/29/2023   11:13 AM 05/22/2023    9:26 AM 05/09/2023    1:46 PM 04/01/2023    9:23 AM  Depression screen PHQ 2/9  Decreased Interest 3 3 3 3  0  Down, Depressed, Hopeless 3 3 3 3 1   PHQ - 2 Score 6 6 6 6 1   Altered sleeping 3 3 3 3    Tired, decreased energy 3 3 3 3    Change in appetite 3 3 3 3    Feeling bad or failure about yourself  3 3 3 3    Trouble concentrating 3 3 3 3    Moving slowly or fidgety/restless 3 3 2 2    Suicidal thoughts 3 1 1 2    PHQ-9 Score 27 25 24 25    Difficult doing work/chores Very difficult Extremely dIfficult Extremely dIfficult Extremely dIfficult    Flowsheet Row Counselor from 06/20/2023 in [facility] from 05/29/2023 in [facility] Counselor from 05/15/2023 in [facility]  C-SSRS RISK  CATEGORY Low Risk Error: Q3, 4, or 5 should not be populated when Q2 is No Error: Q3, 4, or 5 should not be populated when Q2 is No       Suicidal/Homicidal: No. Recent hx of pSI, w/o plan or intent. No current SI.  Therapist Response: Clinician utilized CBT, solution focused, and supportive reflection techniques to support pt in exploring recounts of the past two weeks. Further evoked pt's thoughts and feelings surrounding recent stressors and challenges and the impact these have on pt's daily functioning. Elicited pt's perspectives on readiness to  explore grief/loss surrounding mother's passing. Therapist provided support and empathy to patient during session.  Plan: Return again in 2 weeks.  Diagnosis:  Encounter Diagnoses  Name Primary?   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  Patient/Guardian was advised Release of Information must be obtained prior to any record release in order to collaborate their care with an outside provider. Patient/Guardian was advised if they have not already done so to contact the registration department to sign all necessary forms in order for [place] to release information regarding their care.   Consent: Patient/Guardian gives verbal consent for treatment and assignment of benefits for services provided during this visit. Patient/Guardian expressed understanding and agreed to proceed.   Leisa Lenz, MSW, LCSW 06/20/2023,  9:02 AM

## 2023-06-21 ENCOUNTER — Other Ambulatory Visit: Payer: Self-pay

## 2023-06-21 ENCOUNTER — Other Ambulatory Visit (HOSPITAL_COMMUNITY): Payer: Self-pay

## 2023-06-24 ENCOUNTER — Other Ambulatory Visit (HOSPITAL_COMMUNITY): Payer: Self-pay

## 2023-06-24 ENCOUNTER — Other Ambulatory Visit (HOSPITAL_BASED_OUTPATIENT_CLINIC_OR_DEPARTMENT_OTHER): Payer: Self-pay

## 2023-06-24 ENCOUNTER — Other Ambulatory Visit: Payer: Self-pay | Admitting: Family

## 2023-06-24 MED ORDER — AMLODIPINE BESYLATE 10 MG PO TABS
10.0000 mg | ORAL_TABLET | Freq: Every day | ORAL | 0 refills | Status: DC
  Filled 2023-06-24 – 2023-08-04 (×2): qty 30, 30d supply, fill #0

## 2023-06-25 ENCOUNTER — Other Ambulatory Visit: Payer: Self-pay

## 2023-06-27 ENCOUNTER — Encounter: Payer: Self-pay | Admitting: Family Medicine

## 2023-06-27 ENCOUNTER — Other Ambulatory Visit: Payer: Self-pay | Admitting: Family Medicine

## 2023-06-27 DIAGNOSIS — G8929 Other chronic pain: Secondary | ICD-10-CM

## 2023-06-28 ENCOUNTER — Other Ambulatory Visit: Payer: Self-pay

## 2023-06-28 NOTE — Discharge Instructions (Signed)

## 2023-07-01 ENCOUNTER — Other Ambulatory Visit (HOSPITAL_COMMUNITY): Payer: Self-pay

## 2023-07-01 ENCOUNTER — Inpatient Hospital Stay
Admission: RE | Admit: 2023-07-01 | Discharge: 2023-07-01 | Disposition: A | Discharge status: Home / Self Care | Payer: Medicaid Other | Source: Ambulatory Visit | Attending: Family Medicine | Admitting: Family Medicine

## 2023-07-07 ENCOUNTER — Other Ambulatory Visit (HOSPITAL_COMMUNITY): Payer: Self-pay

## 2023-07-07 ENCOUNTER — Other Ambulatory Visit: Payer: Self-pay | Admitting: Family Medicine

## 2023-07-07 DIAGNOSIS — R351 Nocturia: Secondary | ICD-10-CM

## 2023-07-08 ENCOUNTER — Other Ambulatory Visit (HOSPITAL_COMMUNITY): Payer: Self-pay

## 2023-07-08 ENCOUNTER — Other Ambulatory Visit: Payer: Self-pay

## 2023-07-08 MED ORDER — FINASTERIDE 5 MG PO TABS
5.0000 mg | ORAL_TABLET | Freq: Every day | ORAL | 1 refills | Status: DC
  Filled 2023-07-08: qty 30, 30d supply, fill #0
  Filled 2023-08-04: qty 30, 30d supply, fill #1

## 2023-07-08 NOTE — Progress Notes (Signed)
 Specialty Pharmacy Refill Coordination Note  Phillip Gill is a 48 y.o. male contacted today regarding refills of specialty medication(s) Bictegravir-Emtricitab-Tenofov (Biktarvy )   Patient requested Delivery   Delivery date: 07/10/23   Verified address: [address]   [place] [id number]   Medication will be filled on 07/09/23.

## 2023-07-09 ENCOUNTER — Other Ambulatory Visit: Payer: Self-pay

## 2023-07-11 ENCOUNTER — Ambulatory Visit (INDEPENDENT_AMBULATORY_CARE_PROVIDER_SITE_OTHER): Payer: Medicaid Other | Admitting: Licensed Clinical Social Worker

## 2023-07-11 DIAGNOSIS — F411 Generalized anxiety disorder: Secondary | ICD-10-CM

## 2023-07-11 DIAGNOSIS — F332 Major depressive disorder, recurrent severe without psychotic features: Secondary | ICD-10-CM

## 2023-07-11 NOTE — Discharge Instructions (Signed)

## 2023-07-11 NOTE — Progress Notes (Signed)
 THERAPIST PROGRESS NOTE   Session Date: 07/11/2023  Session Time: 1415 - 1458  Participation Level: Active  Behavioral Response: GuardedAlertAnxious and Depressed  Type of Therapy: Individual Therapy  Treatment Goals addressed:  - LTG: Reduce frequency, intensity, and duration of depression symptoms so that daily functioning is improved (OP Depression) - LTG: Increase coping skills to manage depression and improve ability to perform daily activities (OP Depression) - LTG: To not be depressed, have more energy, and stop over eating (OP Depression) - STG: Report a decrease in anxiety symptoms as evidenced by an overall reduction in anxiety score by a minimum of 25% on the Generalized Anxiety Disorder Scale (GAD-7) (Anxiety) - STG: Brighten Orndoff will reduce frequency of avoidant behaviors by 50% as evidenced by self-report in therapy sessions (Anxiety) - LTG: Be a more people person Increase frequency at which pt is actively engaging in social activities. (Anxiety)  ProgressTowards Goals: Initial  Interventions: CBT, Solution Focused, and Supportive  Summary: Bo is a 48 y.o. male with past psych history of MDD and GAD, presenting for follow up therapy session in efforts to improve management of depressive and anxious symptoms. Patient presented as guarded, in depressed mood and affect, maintaining presentation throughout, actively engaging in session, providing recounts of uneventful events over holidays and ongoing stress experienced within the home. Further explored pt's recent efforts at managing stress, and desires to get outside of the home, proving challenging with limited finances, further motivating pt to engage in exploration of employment opportunities. Began engaging in greater exploration of pt hx, elaborating on pt childhood experiences residing with mother, aunts, cousins, and grandmother, and distant relationship with father throughout childhood. Explored current nature of  relationships with paternal siblings and limited support within family. Reassessed severity of depressive and anxious sxs, and presence of any safety concerns.  Patient responded well to interventions. Patient continues to meet criteria for MDD and GAD. Patient will continue to benefit from engagement in outpatient therapy due to being the least restrictive service to meet presenting needs.      07/11/2023    2:48 PM 06/20/2023    8:56 AM 06/03/2023    1:26 PM 03/05/2023   11:08 AM  GAD 7 : Generalized Anxiety Score  Nervous, Anxious, on Edge 3 3 3 3   Control/stop worrying 3 3 3 3   Worry too much - different things 3 3 3 3   Trouble relaxing 3 3 3 3   Restless 3 1 3 3   Easily annoyed or irritable 3 3 3 3   Afraid - awful might happen 3 3 3 3   Total GAD 7 Score 21 19 21 21   Anxiety Difficulty Extremely difficult Extremely difficult Extremely difficult Extremely difficult      07/11/2023    2:49 PM 06/20/2023    8:58 AM 05/29/2023   11:13 AM 05/22/2023    9:26 AM 05/09/2023    1:46 PM  Depression screen PHQ 2/9  Decreased Interest 3 3 3 3 3   Down, Depressed, Hopeless 3 3 3 3 3   PHQ - 2 Score 6 6 6 6 6   Altered sleeping 3 3 3 3 3   Tired, decreased energy 3 3 3 3 3   Change in appetite 3 3 3 3 3   Feeling bad or failure about yourself  3 3 3 3 3   Trouble concentrating 3 3 3 3 3   Moving slowly or fidgety/restless 3 3 3 2 2   Suicidal thoughts 1 3 1 1 2   PHQ-9 Score 25 27 25  24  25  Difficult doing work/chores Extremely dIfficult Very difficult Extremely dIfficult Extremely dIfficult Extremely dIfficult   Advertising Copywriter from 06/20/2023 in [facility] from 05/29/2023 in [facility] Counselor from 05/15/2023 in [facility]  C-SSRS RISK CATEGORY Low Risk Error: Q3, 4, or 5 should not be populated when Q2 is No Error: Q3, 4, or 5 should not be populated when Q2 is No        Suicidal/Homicidal: No. Reports chronic fleeting thoughts/pSI, w/o plan or intent. No current SI.  Therapist Response: Clinician utilized CBT, solution focused, and supportive reflection techniques to support pt in exploring recounts of the past three weeks. Supported pt in actively reflecting on recent events, processing thoughts and feelings surrounding recent holidays and uneventful engagements with family. Evoked pt's thoughts and feelings surrounding living environment and stressors proving to increase pt's depressive and anxious sxs. Elicited pt's thoughts and perspectives regarding what measures he feels necessary to take in order to manage presenting stressors in the home. Therapist provided support and empathy to patient during session.  Plan: Return again in 2 weeks.  Diagnosis:  Encounter Diagnoses  Name Primary?   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  Patient/Guardian was advised Release of Information must be obtained prior to any record release in order to collaborate their care with an outside provider. Patient/Guardian was advised if they have not already done so to contact the registration department to sign all necessary forms in order for us  to release information regarding their care.   Consent: Patient/Guardian gives verbal consent for treatment and assignment of benefits for services provided during this visit. Patient/Guardian expressed understanding and agreed to proceed.   Lynwood JONETTA Maris, MSW, LCSW 07/11/2023,  2:51 PM

## 2023-07-12 ENCOUNTER — Ambulatory Visit
Admission: RE | Admit: 2023-07-12 | Discharge: 2023-07-12 | Disposition: A | Discharge status: Home / Self Care | Payer: Medicaid Other | Source: Ambulatory Visit | Attending: Family Medicine | Admitting: Family Medicine

## 2023-07-12 DIAGNOSIS — G8929 Other chronic pain: Secondary | ICD-10-CM

## 2023-07-12 MED ORDER — IOPAMIDOL (ISOVUE-M 200) INJECTION 41%
1.0000 mL | Freq: Once | INTRAMUSCULAR | Status: AC
  Administered 2023-07-12: 1 mL via EPIDURAL

## 2023-07-12 MED ORDER — METHYLPREDNISOLONE ACETATE 40 MG/ML INJ SUSP (RADIOLOG
80.0000 mg | Freq: Once | INTRAMUSCULAR | Status: AC
  Administered 2023-07-12: 80 mg via EPIDURAL

## 2023-07-15 ENCOUNTER — Other Ambulatory Visit: Payer: Self-pay

## 2023-07-15 DIAGNOSIS — B181 Chronic viral hepatitis B without delta-agent: Secondary | ICD-10-CM

## 2023-07-15 DIAGNOSIS — Z79899 Other long term (current) drug therapy: Secondary | ICD-10-CM

## 2023-07-15 DIAGNOSIS — B20 Human immunodeficiency virus [HIV] disease: Secondary | ICD-10-CM

## 2023-07-15 DIAGNOSIS — Z113 Encounter for screening for infections with a predominantly sexual mode of transmission: Secondary | ICD-10-CM

## 2023-07-15 NOTE — Addendum Note (Signed)
 Addended by: Linna Hoff D on: 07/15/2023 03:11 PM   Modules accepted: Orders

## 2023-07-18 ENCOUNTER — Other Ambulatory Visit: Payer: Medicaid Other

## 2023-07-18 NOTE — Addendum Note (Signed)
Addended by: Harley Alto on: 07/18/2023 08:23 AM   Modules accepted: Orders

## 2023-07-25 ENCOUNTER — Ambulatory Visit (HOSPITAL_COMMUNITY): Payer: Medicaid Other | Admitting: Licensed Clinical Social Worker

## 2023-07-29 ENCOUNTER — Other Ambulatory Visit (HOSPITAL_COMMUNITY): Payer: Self-pay | Admitting: Pharmacy Technician

## 2023-07-29 ENCOUNTER — Other Ambulatory Visit (HOSPITAL_COMMUNITY): Payer: Self-pay

## 2023-07-29 NOTE — Progress Notes (Signed)
Specialty Pharmacy Refill Coordination Note  Phillip Gill is a 48 y.o. male contacted today regarding refills of specialty medication(s) Bictegravir-Emtricitab-Tenofov [name])   Patient requested Delivery   Delivery date: 08/02/23   Verified address: Patient address [address]  [place]   Medication will be filled on 08/01/23.

## 2023-07-30 ENCOUNTER — Ambulatory Visit (HOSPITAL_COMMUNITY): Payer: Medicaid Other | Admitting: Licensed Clinical Social Worker

## 2023-08-01 ENCOUNTER — Other Ambulatory Visit: Payer: Self-pay

## 2023-08-01 ENCOUNTER — Ambulatory Visit (INDEPENDENT_AMBULATORY_CARE_PROVIDER_SITE_OTHER): Payer: Medicaid Other | Admitting: Family

## 2023-08-01 ENCOUNTER — Other Ambulatory Visit (HOSPITAL_COMMUNITY): Payer: Self-pay

## 2023-08-01 ENCOUNTER — Other Ambulatory Visit (HOSPITAL_COMMUNITY)
Admission: RE | Admit: 2023-08-01 | Discharge: 2023-08-01 | Disposition: A | Discharge status: Home / Self Care | Payer: Medicaid Other | Source: Ambulatory Visit | Attending: Family | Admitting: Family

## 2023-08-01 ENCOUNTER — Encounter: Payer: Self-pay | Admitting: Family

## 2023-08-01 VITALS — BP 125/91 | HR 8 | Temp 97.4°F | Wt 252.0 lb

## 2023-08-01 DIAGNOSIS — Z0000 Encounter for general adult medical examination without abnormal findings: Secondary | ICD-10-CM

## 2023-08-01 DIAGNOSIS — Z113 Encounter for screening for infections with a predominantly sexual mode of transmission: Secondary | ICD-10-CM | POA: Diagnosis present

## 2023-08-01 DIAGNOSIS — B181 Chronic viral hepatitis B without delta-agent: Secondary | ICD-10-CM

## 2023-08-01 DIAGNOSIS — Z79899 Other long term (current) drug therapy: Secondary | ICD-10-CM | POA: Diagnosis not present

## 2023-08-01 DIAGNOSIS — Z23 Encounter for immunization: Secondary | ICD-10-CM

## 2023-08-01 DIAGNOSIS — B20 Human immunodeficiency virus [HIV] disease: Secondary | ICD-10-CM

## 2023-08-01 MED ORDER — BIKTARVY 50-200-25 MG PO TABS
ORAL_TABLET | ORAL | 5 refills | Status: DC
  Filled 2023-08-01: qty 30, 30d supply, fill #0
  Filled 2023-09-02: qty 30, 30d supply, fill #1
  Filled 2023-09-19 – 2023-10-01 (×2): qty 30, 30d supply, fill #2
  Filled 2023-10-28: qty 30, 30d supply, fill #3
  Filled 2023-11-19: qty 30, 30d supply, fill #4

## 2023-08-01 NOTE — Assessment & Plan Note (Signed)
Phillip Gill last lab work shows his hepatitis B surface antigen has flipped to nonreactive and appears to be recovered with hepatitis B surface antibody positive and undetectable viral load.  Will continue to monitor at this point although he does remain on Biktarvy.

## 2023-08-01 NOTE — Progress Notes (Addendum)
08/01/23-CA: Biktarvy   Left patient a voicemail. Refill too soon until 02/01. Will mail 02/03 to be delivered on Tuesday 02/04. Will check to see if patient has enough on hand when we receive a call back.

## 2023-08-01 NOTE — Patient Instructions (Signed)
Nice to see you.  We will check your lab work today.  Please call [facility]) to schedule/follow up on your dental care at [phone]  Continue to take your medication daily as prescribed.  Refills have been sent to the pharmacy.  Plan for follow up in 4 months or sooner if needed with lab work on the same day.  Have a great day and stay safe!

## 2023-08-01 NOTE — Progress Notes (Signed)
Brief Narrative   Patient ID: Phillip Gill, male    DOB: Nov 09, 1975, 48 y.o.   MRN: [id number]  Phillip Gill is a 48 y/o AA male diagnosed with HIV disease in July 2012 with risk factor of MSM. Initial viral load of 99,000 and CD4 of 510 entering care at Stage 1. [id number] negative. Initial genotype with no significant resistance. Previous ART experience with Prezista, Truvada, and Biktarvy.    Subjective:    Chief Complaint  Patient presents with   Follow-up    Flu+covid     HPI:  Phillip Gill is a 48 y.o. male with HIV disease and chronic hepatitis B last seen on 04/01/2023 with well-controlled virus and good adherence and tolerance to [place].  Viral load was 84 with CD4 count 1216.  Hepatitis B DNA not detected and positive hepatitis B surface antibody.  Here today for routine follow-up.  Phillip Gill has been doing okay since his last office visit and recently started working a new job in the last week working nights.  He is here today following a night shift.  Continues to take Biktarvy as prescribed and no adverse side effects or problems obtaining medication from the pharmacy.  Remains covered by Medicaid.  Working with case management, Carmen through [facility], and has been referred to [facility] for housing assistance.  No new concerns/complaints.  Condoms and site-specific STD testing offered.  Healthcare maintenance reviewed.  Due for routine dental care.  Denies fevers, chills, night sweats, headaches, changes in vision, neck pain/stiffness, nausea, diarrhea, vomiting, lesions or rashes.  Lab Results  Component Value Date   CD4TCELL 34 04/01/2023   CD4TABS 1,216 04/01/2023   Lab Results  Component Value Date   HIV1RNAQUANT 84 (H) 04/01/2023     Allergies  Allergen Reactions   Bactrim [Sulfamethoxazole W/Trimethoprim (Co-Trimoxazole)] Hives and Shortness Of Breath   Sulfa Antibiotics Hives and Shortness Of Breath   Cymbalta [Duloxetine Hcl] Diarrhea   Duloxetine  Diarrhea   Other Hives and Swelling    [employer] toothpaste    Zoloft [Sertraline Hcl] Diarrhea   Clindamycin/Lincomycin Rash   Neurontin [Gabapentin] Rash      Outpatient Medications Prior to Visit  Medication Sig Dispense Refill   amLODipine (NORVASC) 10 MG tablet Take 1 tablet (10 mg total) by mouth daily. 30 tablet 0   ARIPiprazole (ABILIFY) 5 MG tablet Take 1 tablet (5 mg total) by mouth daily. 90 tablet 0   buPROPion (WELLBUTRIN XL) 150 MG 24 hr tablet Take 1 tablet (150 mg total) by mouth daily. 30 tablet 0   diclofenac Sodium (VOLTAREN) 1 % GEL Apply 4 grams topically 4 (four) times daily to affected joint. 100 g 11   escitalopram (LEXAPRO) 20 MG tablet Take 1 tablet (20 mg total) by mouth daily. 30 tablet 5   esomeprazole (NEXIUM) 40 MG capsule Take 1 capsule (40 mg total) by mouth daily before breakfast. 90 capsule 0   esomeprazole (NEXIUM) 40 MG capsule Take 1 capsule (40 mg total) by mouth 2 (two) times daily before a meal. 180 capsule 3   finasteride (PROSCAR) 5 MG tablet Take 1 tablet (5 mg total) by mouth daily. 30 tablet 1   polyethylene glycol powder (GLYCOLAX/MIRALAX) 17 GM/SCOOP powder Dissolve 17 grams in liquid and drink by mouth every day. 476 g 0   propranolol (INDERAL) 10 MG tablet Take 1 tablet (10 mg total) by mouth 2 (two) times daily as needed. 60 tablet 0   bictegravir-emtricitabine-tenofovir AF (BIKTARVY)  50-200-25 MG TABS tablet TAKE 1 TABLET BY MOUTH EVERY DAY 30 tablet 5   famotidine (PEPCID) 40 MG tablet Take 1 tablet (40 mg total) by mouth at bedtime. 90 tablet 0   tadalafil (CIALIS) 10 MG tablet Take 0.5 tablets (5 mg total) by mouth daily. 45 tablet 0   No facility-administered medications prior to visit.     Past Medical History:  Diagnosis Date   Anxiety    Arthritis    "neck" (02/16/2015)   Chronic hepatitis B (HCC)    SECONDARY TO HIV   Chronic lower back pain    Depression    Fibromyalgia    Genital warts    HIV disease (HCC) 02/28/2015    HIV infection (HCC)    followed by Dr. Luciana Axe- sees him every 4 months   Hypertension    IBS (irritable bowel syndrome)    Migraine    "none in years" (02/16/2015   Overweight 07/20/2015   Renal insufficiency 12/29/2013     Past Surgical History:  Procedure Laterality Date   CO2 LASER APPLICATION N/A 02/11/2013   Procedure: CO2 LASER APPLICATION;  Surgeon: Romie Levee, MD;  Location: [facility];  Service: General;  Laterality: N/A;   FOOT SURGERY     HIGH RESOLUTION ANOSCOPY N/A 02/11/2013   Procedure: HIGH RESOLUTION ANOSCOPY WITH BIOPSY, LASER ABLATION;  Surgeon: Romie Levee, MD;  Location: [facility];  Service: General;  Laterality: N/A;   WISDOM TOOTH EXTRACTION        Review of Systems  Constitutional:  Negative for appetite change, chills, fatigue, fever and unexpected weight change.  Eyes:  Negative for visual disturbance.  Respiratory:  Negative for cough, chest tightness, shortness of breath and wheezing.   Cardiovascular:  Negative for chest pain and leg swelling.  Gastrointestinal:  Negative for abdominal pain, constipation, diarrhea, nausea and vomiting.  Genitourinary:  Negative for dysuria, flank pain, frequency, genital sores, hematuria and urgency.  Skin:  Negative for rash.  Allergic/Immunologic: Negative for immunocompromised state.  Neurological:  Negative for dizziness and headaches.      Objective:    BP (!) 125/91   Pulse (!) 8   Temp (!) 97.4 F (36.3 C) (Oral)   Wt 252 lb (114.3 kg)   SpO2 95%   BMI 34.18 kg/m  Nursing note and vital signs reviewed.  Physical Exam Constitutional:      General: He is not in acute distress.    Appearance: He is well-developed.  Eyes:     Conjunctiva/sclera: Conjunctivae normal.  Cardiovascular:     Rate and Rhythm: Normal rate and regular rhythm.     Heart sounds: Normal heart sounds. No murmur heard.    No friction rub. No gallop.  Pulmonary:     Effort: Pulmonary  effort is normal. No respiratory distress.     Breath sounds: Normal breath sounds. No wheezing or rales.  Chest:     Chest wall: No tenderness.  Abdominal:     General: Bowel sounds are normal.     Palpations: Abdomen is soft.     Tenderness: There is no abdominal tenderness.  Musculoskeletal:     Cervical back: Neck supple.  Lymphadenopathy:     Cervical: No cervical adenopathy.  Skin:    General: Skin is warm and dry.     Findings: No rash.  Neurological:     Mental Status: He is alert and oriented to person, place, and time.  Psychiatric:  Behavior: Behavior normal.        Thought Content: Thought content normal.        Judgment: Judgment normal.         08/01/2023    9:36 AM 07/11/2023    2:49 PM 06/20/2023    8:58 AM 05/29/2023   11:13 AM 05/22/2023    9:26 AM  Depression screen PHQ 2/9  Decreased Interest 1      Down, Depressed, Hopeless 1      PHQ - 2 Score 2      Altered sleeping 1      Tired, decreased energy 1      Change in appetite 1      Feeling bad or failure about yourself  1      Trouble concentrating 1      Moving slowly or fidgety/restless 1      Suicidal thoughts 0      PHQ-9 Score 8      Difficult doing work/chores Extremely dIfficult         Information is confidential and restricted. Go to Review Flowsheets to unlock data.       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   Gastroesophageal reflux disease 03/05/2023   Passive suicidal ideations 02/05/2023   OSA on CPAP 02/05/2023   MDD (major depressive disorder) 12/19/2022   Urinary frequency 12/18/2022   Left foot pain 12/18/2022   Poor social situation 12/18/2022   Snoring 01/22/2022   Encounter for physical examination related to employment 09/26/2021   Human monkeypox 04/06/2021   Cervical pain 02/07/2021    Erectile dysfunction 08/29/2020   Healthcare maintenance 04/05/2020   Foot lesion 12/24/2019   Human immunodeficiency virus (HIV) disease (HCC) 08/25/2019   Gonorrhea 02/11/2019   Hepatitis B surface antigen positive 09/05/2018   Overweight 07/20/2015   Colitis 03/08/2015   HIV (human immunodeficiency virus infection) (HCC)    Tobacco abuse    LGV (lymphogranuloma venereum)    Primary syphilis    Chronic hepatitis B (HCC) 10/03/2014   HTN (hypertension)    Gynecomastia 04/30/2014   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  Will continue to monitor at this point although he does remain on Biktarvy.      Relevant Medications   bictegravir-emtricitabine-tenofovir AF (BIKTARVY) 50-200-25 MG TABS tablet     Other   Human immunodeficiency virus (HIV) disease (HCC) (Chronic)   Phillip Gill continues to have well-controlled virus with good adherence and tolerance to [employer].  Reviewed previous lab work and discussed plan of care and U equals U.  Check lab work.  Continue current dose of Biktarvy.  Emphasize importance of continue to take medication daily as prescribed.  Covered by Medicaid.  Plan for follow-up in 4 months or sooner if needed with lab work on the same day.      Relevant  Medications   bictegravir-emtricitabine-tenofovir AF (BIKTARVY) 50-200-25 MG TABS tablet   Healthcare maintenance   Discussed importance of safe sexual practice and condom use. Condoms and site specific STD testing offered.   Vaccinations reviewed with COVID and influenza vaccinations updated. Colonoscopy completed Due for routine dental care with appointment information provided in after visit summary.      Other Visit Diagnoses       Encounter for immunization    -  Primary   Relevant Orders   Flu vaccine trivalent PF, 6mos and older(Flulaval,Afluria,Fluarix,Fluzone) (Completed)   Pfizer Comirnaty Covid-19 Vaccine 44yrs & older (Completed)     Screening for venereal disease         Polypharmacy            I am having Phillip M. Hett "Shan" maintain his diclofenac Sodium, polyethylene glycol powder, escitalopram, famotidine, tadalafil, buPROPion, propranolol, ARIPiprazole, esomeprazole, esomeprazole, amLODipine, finasteride, and Biktarvy.   Meds ordered this encounter  Medications   bictegravir-emtricitabine-tenofovir AF (BIKTARVY) 50-200-25 MG TABS tablet    Sig: TAKE 1 TABLET BY MOUTH EVERY DAY    Dispense:  30 tablet    Refill:  5    Supervising Provider:   Judyann Munson [[id number]]     Follow-up: Return in about 4 months (around 11/29/2023), or if symptoms worsen or fail to improve. or sooner if needed.    Marcos Eke, MSN, FNP-C Nurse Practitioner [facility] Main number: [phone]

## 2023-08-01 NOTE — Assessment & Plan Note (Signed)
Phillip Gill continues to have well-controlled virus with good adherence and tolerance to [place].  Reviewed previous lab work and discussed plan of care and U equals U.  Check lab work.  Continue current dose of Biktarvy.  Emphasize importance of continue to take medication daily as prescribed.  Covered by Medicaid.  Plan for follow-up in 4 months or sooner if needed with lab work on the same day.

## 2023-08-01 NOTE — Assessment & Plan Note (Signed)
Discussed importance of safe sexual practice and condom use. Condoms and site specific STD testing offered.  Vaccinations reviewed with COVID and influenza vaccinations updated. Colonoscopy completed Due for routine dental care with appointment information provided in after visit summary.

## 2023-08-02 LAB — URINE CYTOLOGY ANCILLARY ONLY
Chlamydia: NEGATIVE
Comment: NEGATIVE
Comment: NORMAL
Neisseria Gonorrhea: NEGATIVE

## 2023-08-04 ENCOUNTER — Other Ambulatory Visit: Payer: Self-pay | Admitting: Family Medicine

## 2023-08-04 DIAGNOSIS — K5909 Other constipation: Secondary | ICD-10-CM

## 2023-08-04 DIAGNOSIS — R351 Nocturia: Secondary | ICD-10-CM

## 2023-08-04 DIAGNOSIS — K219 Gastro-esophageal reflux disease without esophagitis: Secondary | ICD-10-CM

## 2023-08-04 LAB — RPR: RPR Ser Ql: NONREACTIVE

## 2023-08-04 LAB — HEPATITIS B SURFACE ANTIBODY, QUANTITATIVE: Hep B S AB Quant (Post): 6 m[IU]/mL — ABNORMAL LOW (ref >=10)

## 2023-08-04 LAB — LIPID PANEL
Cholesterol: 199 mg/dL (ref <200)
HDL: 42 mg/dL (ref >=40)
LDL Cholesterol (Calc): 130 mg/dL — ABNORMAL HIGH
Non-HDL Cholesterol (Calc): 157 mg/dL — ABNORMAL HIGH (ref <130)
Total CHOL/HDL Ratio: 4.7 (calc) (ref <5.0)
Triglycerides: 158 mg/dL — ABNORMAL HIGH (ref <150)

## 2023-08-04 LAB — T-HELPER CELLS (CD4) COUNT (NOT AT ARMC)
Absolute CD4: 1023 {cells}/uL (ref 490–1740)
CD4 T Helper %: 35 % (ref 30–61)
Total lymphocyte count: 2965 {cells}/uL (ref 850–3900)

## 2023-08-04 LAB — HEPATITIS B DNA, ULTRAQUANTITATIVE, PCR
Hepatitis B DNA: NOT DETECTED [IU]/mL
Hepatitis B virus DNA: NOT DETECTED {Log}

## 2023-08-04 LAB — HEPATITIS B SURFACE ANTIGEN: Hepatitis B Surface Ag: NONREACTIVE

## 2023-08-05 ENCOUNTER — Other Ambulatory Visit: Payer: Self-pay

## 2023-08-05 ENCOUNTER — Other Ambulatory Visit (HOSPITAL_COMMUNITY): Payer: Self-pay

## 2023-08-05 NOTE — Progress Notes (Addendum)
Insurnace is terminated, and no results in eligibility check. Messaged Lupita Leash.  New insurance on file. Shipping as scheduled.

## 2023-08-06 ENCOUNTER — Other Ambulatory Visit (HOSPITAL_COMMUNITY): Payer: Self-pay

## 2023-08-06 ENCOUNTER — Ambulatory Visit (INDEPENDENT_AMBULATORY_CARE_PROVIDER_SITE_OTHER): Payer: MEDICAID | Admitting: Neurology

## 2023-08-06 VITALS — BP 137/89 | HR 89 | Ht 72.0 in | Wt 247.2 lb

## 2023-08-06 DIAGNOSIS — G4734 Idiopathic sleep related nonobstructive alveolar hypoventilation: Secondary | ICD-10-CM

## 2023-08-06 DIAGNOSIS — Z789 Other specified health status: Secondary | ICD-10-CM | POA: Diagnosis not present

## 2023-08-06 DIAGNOSIS — G4733 Obstructive sleep apnea (adult) (pediatric): Secondary | ICD-10-CM | POA: Diagnosis not present

## 2023-08-06 MED ORDER — TADALAFIL 10 MG PO TABS
5.0000 mg | ORAL_TABLET | Freq: Every day | ORAL | 0 refills | Status: DC
  Filled 2023-08-06: qty 45, 90d supply, fill #0

## 2023-08-06 MED ORDER — FAMOTIDINE 40 MG PO TABS
40.0000 mg | ORAL_TABLET | Freq: Every day | ORAL | 0 refills | Status: DC
  Filled 2023-08-06: qty 90, 90d supply, fill #0

## 2023-08-06 MED ORDER — POLYETHYLENE GLYCOL 3350 17 GM/SCOOP PO POWD
17.0000 g | Freq: Every day | ORAL | 0 refills | Status: DC
  Filled 2023-08-06: qty 476, 28d supply, fill #0

## 2023-08-06 NOTE — Progress Notes (Signed)
 Subjective:    Patient ID: Phillip Gill is a 48 y.o. male.  HPI    Interim history:   [name] is a 48 year old male with an underlying medical history of hepatitis B, fibromyalgia, HIV disease, hypertension, irritable bowel syndrome, migraine headaches, renal insufficiency, anxiety, arthritis, and overweight state, who presents for follow-up consultation of his obstructive sleep apnea.  I last saw him on 03/07/2023, at which time he reported that he had returned his AutoPap machine but that he wanted to get restarted.  He reported sleepiness at the wheel.  We talked about his home sleep test results from November 2023.  I wrote for a restart on AutoPap therapy.   Today, 08/06/2023: I reviewed his AutoPap compliance data from 07/06/2023 through 08/04/2023, which is a total of 30 days, during which time he used his machine 21 days with percent use days greater than 4 hours at 60%, indicating mildly suboptimal compliance, average usage of 5 hours and 45 minutes, residual AHI elevated at 31.7/h with a 95th percentile of pressure at 14.9 cm, range of 7 to 15 cm with EPR of 3.  Leak acceptable with the 95th percentile at 5.8 L/min.  He reports feeling better.  He can tell the difference, he is motivated to continue with treatment.  His sleepiness is improved.  He does have some difficulty with his fullface mask, he started off with a hybrid style but that was difficult to use and currently he is using a traditional fullface mask that covers the nose.  He still is adjusting to treatment, he works nights and sometimes he travels and does not take his machine with him.  I reviewed a compliance download from May 03 2023 through 12 08/2022, at that time he used his machine more consistently with percent use days more than 4 hours at 81%.  Residual AHI was in the moderate range at 19.3/h but leak a lot higher, with the 95th percentile at 44 L/min.  He does not always hydrate very well with water and he admits.   He does have some mouth dryness from the air but is willing to increase the pressure if need be.   The patient's allergies, current medications, family history, past medical history, past social history, past surgical history and problem list were reviewed and updated as appropriate.    Previously:    He missed an appointment on 08/13/22. I first met him on 04/18/2022, at which time he reported snoring and excessive daytime somnolence.  He was advised to proceed with a sleep study.  He had a home sleep test on 05/29/2022 which indicated severe obstructive sleep apnea with a total AHI of 83.9/hour and O2 nadir of 72% with significant time below or at 88% saturation of over 90 minutes, indicating nocturnal hypoxemia. There was a mild central apnea component. Moderate to loud snoring was detected.  He was advised to proceed with home AutoPap therapy.  His set up date was 06/08/2022.     He did not return for a FU appointment.    He had a ResMed air sense 10 AutoSet machine, DME company was [employer].     04/18/22: (He) reports snoring and excessive daytime somnolence.  I reviewed your office note from 01/22/2022.  His Epworth sleepiness score is 21 out of 24, fatigue severity score is 63 out of 63.  He admits to falling asleep easily, he admits to falling asleep at the wheel, he has never had a car accident but has dozed  off.  He has been strongly.  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  He goes to bed generally between 8 and 10 PM and rise time is generally between 5 and 7, depending on what job he has to go to.  He currently stays with family members.  He has been told that he stops breathing while asleep by ex partners.  He has nocturia frequently, about 4-5 times per average night, has had occasional morning headaches, these are dull, achy, not severe enough to warrant medication, even  over-the-counter.  He drinks caffeine in the form of soda, 2-3 bottles per day, occasional coffee.  He quit smoking in February 2023 and vapes nicotine , planning to quit altogether.  He drinks alcohol occasionally.  He does have a TV in his bedroom and has to stay on night for him to be able to sleep, he states.  He has a cousin with sleep apnea and is familiar with CPAP therapy due to his patients.    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 Efrain- sees him every 4 months   Hypertension    IBS (irritable bowel syndrome)    Migraine    none in years (02/16/2015   Overweight 07/20/2015   Renal insufficiency 12/29/2013    His Past Surgical History Is Significant For: Past Surgical History:  Procedure Laterality Date   CO2 LASER APPLICATION N/A 02/11/2013   Procedure: CO2 LASER APPLICATION;  Surgeon: Bernarda Ned, MD;  Location: [facility];  Service: General;  Laterality: N/A;   FOOT SURGERY     HIGH RESOLUTION ANOSCOPY N/A 02/11/2013   Procedure: HIGH RESOLUTION ANOSCOPY WITH BIOPSY, LASER ABLATION;  Surgeon: Bernarda Ned, MD;  Location: [facility];  Service: General;  Laterality: N/A;   WISDOM TOOTH EXTRACTION      His Family History Is Significant For: Family History  Problem Relation Age of Onset   Hypertension Mother    Diabetes Mother    Stroke Mother        cerbral aneurysm   Pancreatic cancer Mother    Colon cancer Father    Hypertension Brother    Sleep apnea Cousin    Mental illness Neg Hx    Stomach cancer Neg Hx    Esophageal cancer Neg Hx     His Social History Is Significant For: Social History   Socioeconomic History   Marital status: Single     Spouse name: Not on file   Number of children: Not on file   Years of education: Not on file   Highest education level: Not on file  Occupational History   Not on file  Tobacco Use   Smoking status: Former    Current packs/day: 0.00    Average packs/day: 1 pack/day for 18.0 years (18.0 ttl pk-yrs)    Types: Cigarettes    Start date: 07/03/2003    Quit date: 07/02/2021    Years since quitting: 2.0    Passive exposure: Never  Smokeless tobacco: Never   Tobacco comments:    Pt vapes everyday  Vaping Use   Vaping status: Former   Quit date: 12/01/2022   Substances: Nicotine   Substance and Sexual Activity   Alcohol use: Yes    Comment: occasional use   Drug use: No   Sexual activity: Not on file    Comment: accepted condoms  Other Topics Concern   Not on file  Social History Narrative   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    Finished HS.   Doing online classes with [employer]- studying accounting   Previously 2 years of classes at [employer].    Has Partner- [name]- together for 1 year.    Has 2 girls- (born in 2000).          Social Drivers of Corporate Investment Banker Strain: High Risk (02/05/2023)   Overall Financial Resource Strain (CARDIA)    Difficulty of Paying Living Expenses: Very hard  Food Insecurity: Food Insecurity Present (02/05/2023)   Hunger Vital Sign    Worried About Running Out of Food in the Last Year: Sometimes true    Ran Out of Food in the Last Year: Sometimes true  Transportation Needs: Unmet Transportation Needs (02/05/2023)   PRAPARE - Administrator, Civil Service (Medical): Yes    Lack of Transportation (Non-Medical): Yes  Physical Activity: Sufficiently Active (07/16/2018)   [facility]   Exercise Vital Sign    Days of Exercise per Week: 4 days    Minutes of Exercise per Session: 150+ min  Stress:  Stress Concern Present (03/01/2023)   [employer] of Occupational Health - Occupational Stress Questionnaire    Feeling of Stress : To some extent  Social Connections: Unknown (05/23/2022)   [facility] from [facility], [facility]   Social Network    Social Network: Not on file    His Allergies Are:  Allergies  Allergen Reactions   Bactrim [Sulfamethoxazole W/Trimethoprim (Co-Trimoxazole)] Hives and Shortness Of Breath   Sulfa Antibiotics Hives and Shortness Of Breath   Cymbalta  [Duloxetine  Hcl] Diarrhea   Duloxetine  Diarrhea   Other Hives and Swelling    [employer] toothpaste    Zoloft [Sertraline Hcl] Diarrhea   Clindamycin /Lincomycin Rash   Neurontin  [Gabapentin ] Rash  :   His Current Medications Are:  Outpatient Encounter Medications as of 08/06/2023  Medication Sig   amLODipine  (NORVASC ) 10 MG tablet Take 1 tablet (10 mg total) by mouth daily.   ARIPiprazole  (ABILIFY ) 5 MG tablet Take 1 tablet (5 mg total) by mouth daily.   bictegravir-emtricitabine -tenofovir  AF (BIKTARVY ) 50-200-25 MG TABS tablet TAKE 1 TABLET BY MOUTH EVERY DAY   buPROPion  (WELLBUTRIN  XL) 150 MG 24 hr tablet Take 1 tablet (150 mg total) by mouth daily.   diclofenac  Sodium (VOLTAREN ) 1 % GEL Apply 4 grams topically 4 (four) times daily to affected joint.   escitalopram  (LEXAPRO ) 20 MG tablet Take 1 tablet (20 mg total) by mouth daily.   esomeprazole  (NEXIUM ) 40 MG capsule Take 1 capsule (40 mg total) by mouth daily before breakfast.   esomeprazole  (NEXIUM ) 40 MG capsule Take 1 capsule (40 mg total) by mouth 2 (two) times daily before a meal.   finasteride  (PROSCAR ) 5 MG tablet Take 1 tablet (5 mg total) by mouth daily.   polyethylene glycol powder (GLYCOLAX /MIRALAX ) 17 GM/SCOOP powder Dissolve 17 grams in liquid and  drink by mouth every day.   propranolol  (INDERAL ) 10 MG tablet Take 1 tablet (10 mg total) by mouth 2 (two) times daily as needed.   famotidine  (PEPCID ) 40 MG tablet Take 1 tablet (40 mg  total) by mouth at bedtime.   tadalafil  (CIALIS ) 10 MG tablet Take 0.5 tablets (5 mg total) by mouth daily.   No facility-administered encounter medications on file as of 08/06/2023.  :  Review of Systems:  Out of a complete 14 point review of systems, all are reviewed and negative with the exception of these symptoms as listed below:   Review of Systems  Neurological:        Patient in room #9 and alone. Patient states he see a big different in using his CPAP at night. Patient states he sleeps better with his CPAP and not grasping for air.    Objective:  Neurological Exam  Physical Exam Physical Examination:   Vitals:   08/06/23 0744  BP: 137/89  Pulse: 89    General Examination: The patient is a very pleasant 48 y.o. male in no acute distress. He appears well-developed and well-nourished and well groomed.   HEENT: Normocephalic, atraumatic, pupils are equal, round and reactive to light, extraocular tracking is good without limitation to gaze excursion or nystagmus noted. Hearing is grossly intact. Face is symmetric with normal facial animation. Speech with mild impediment noted, does not appear to be new.  There is no lip, neck/head, jaw or voice tremor. Neck is supple with full range of passive and active motion. There are no carotid bruits on auscultation. Oropharynx exam reveals: mild to moderate mouth dryness, adequate dental hygiene and moderate airway crowding. Mallampati class IV.  Tongue protrudes centrally and palate elevates symmetrically.   Chest: Clear to auscultation without wheezing, rhonchi or crackles noted.   Heart: S1+S2+0, regular and normal without murmurs, rubs or gallops noted.    Abdomen: Soft, non-tender and non-distended.   Extremities: There is no obvious swelling in the distal lower extremities bilaterally.    Skin: Warm and dry without trophic changes noted.    Musculoskeletal: exam reveals no obvious joint deformities.    Neurologically:   Mental status: The patient is awake, alert and oriented in all 4 spheres. His immediate and remote memory, attention, language skills and fund of knowledge are appropriate. There is no evidence of aphasia, agnosia, apraxia or anomia. Speech is clear with normal prosody and enunciation. Thought process is linear. Mood is normal and affect is normal.  Cranial nerves II - XII are as described above under HEENT exam.  Motor exam: Normal bulk, strength and tone is noted. There is no obvious action or resting tremor.  Fine motor skills and coordination: grossly intact.  Cerebellar testing: No dysmetria or intention tremor. There is no truncal or gait ataxia.  Sensory exam: intact to light touch in the upper and lower extremities.  Gait, station and balance: He stands easily. No veering to one side is noted. No leaning to one side is noted. Posture is age-appropriate and stance is narrow based. Gait shows normal stride length and normal pace. No problems turning are noted.    Assessment and Plan:  In summary, RUBENS CRANSTON is a 48 year old male with an underlying medical history of hepatitis B, fibromyalgia, HIV disease, hypertension, irritable bowel syndrome, migraine headaches, renal insufficiency, anxiety, arthritis, and overweight state, who presents for follow-up consultation of his severe obstructive sleep apnea. He had a home sleep test on 05/29/2022 which indicated  severe obstructive sleep apnea with a total AHI of 83.9/hour and O2 nadir of 72% with significant time below or at 88% saturation of over 90 minutes, indicating nocturnal hypoxemia. There was a mild central apnea component as well. Moderate to loud snoring was detected.  He had started home AutoPap therapy in December 2023 but returned the machine, eventually restarted treatment in the fall 2024.  He is currently compliant with treatment but has had some lapses in compliance.  His residual AHI is still elevated, albeit better compared to  his home sleep test.  I would like to increase his AutoPap settings from currently 7 to 15 cm 8 to 17 cm to see if we can improve his apnea control, residual AHI is primarily elevated due to obstructive events, not so much central events.  Eventually, he may benefit from a proper titration study in the near future.  For now, we will review a compliance download remotely in about a month.  He is encouraged to call us  or email us  through MyChart so we can review his compliance on the new settings.  He is advised to follow-up in the sleep clinic to see one of our nurse practitioners routinely in about 6 months, sooner if needed.  If all goes well on the new settings, I would like to also proceed with a home nocturnal pulse oximetry test to make sure his oxygen saturations are adequate while on AutoPap therapy. We talked about the importance of treating severe obstructive sleep apnea not just for symptom control but also to reduce cardiovascular risks down the road.  He works night and is advised to be consistent with his AutoPap usage.  I again explained to him the importance of compliance with treatment.  His leak from the mask has improved when he switched from a hybrid style to a traditional fullface mask but he is still having some trouble tolerating the mask.  He would like to try a nasal mask.  He is advised to talk to his DME provider about mask replacement but is advised that he may need a fullface mask if he is a mouth breather.  He is again advised not to drive when feeling sleepy.  He is commended for his treatment adherence at this time and his motivation to continue with treatment.  I answered all his questions today and he was in agreement with our plan.  I spent 45 minutes in total face-to-face time and in reviewing records during pre-charting, more than 50% of which was spent in counseling and coordination of care, reviewing test results, reviewing medications and treatment regimen and/or in discussing  or reviewing the diagnosis of severe obstructive sleep apnea, the prognosis and treatment options. Pertinent laboratory and imaging test results that were available during this visit with the patient were reviewed by me and considered in my medical decision making (see chart for details).

## 2023-08-06 NOTE — Patient Instructions (Signed)
 I am glad things are going better with your AutoPap machine.  I would like to increase your pressure setting as your sleep apnea is not under good control currently.  We can review a compliance download in about a month remotely.  Please follow-up routinely to see one of our nurse practitioners in the sleep clinic in about 6 months, sooner if needed.

## 2023-08-08 ENCOUNTER — Ambulatory Visit (INDEPENDENT_AMBULATORY_CARE_PROVIDER_SITE_OTHER): Payer: MEDICAID | Admitting: Licensed Clinical Social Worker

## 2023-08-08 DIAGNOSIS — F411 Generalized anxiety disorder: Secondary | ICD-10-CM

## 2023-08-08 DIAGNOSIS — F332 Major depressive disorder, recurrent severe without psychotic features: Secondary | ICD-10-CM | POA: Diagnosis not present

## 2023-08-08 NOTE — Progress Notes (Signed)
 THERAPIST PROGRESS NOTE   Session Date: 08/08/2023  Session Time: 1608 - 1700  Participation Level: Active  Behavioral Response: CasualAlertAnxious and Depressed  Type of Therapy: Individual Therapy  Treatment Goals addressed:  - LTG: Reduce frequency, intensity, and duration of depression symptoms so that daily functioning is improved (OP Depression) - LTG: Increase coping skills to manage depression and improve ability to perform daily activities (OP Depression) - LTG: To not be depressed, have more energy, and stop over eating (OP Depression) - STG: Report a decrease in anxiety symptoms as evidenced by an overall reduction in anxiety score by a minimum of 25% on the Generalized Anxiety Disorder Scale (GAD-7) (Anxiety) - STG: Huntley Knoop will reduce frequency of avoidant behaviors by 50% as evidenced by self-report in therapy sessions (Anxiety) - LTG: Be a more people person Increase frequency at which pt is actively engaging in social activities. (Anxiety)  ProgressTowards Goals: Progressing  Interventions: CBT, Solution Focused, and Supportive  Summary: Bo is a 48 y.o. male with past psych history of MDD and GAD, presenting for follow up therapy session in efforts to improve management of depressive and anxious symptoms. Patient actively engaged in session, presenting in depressive and anxious moods, with congruent affect throughout.   - Pt engaged in re-administering of GAD-7, processing variances in scores since previous visit and exploring changes in identified sxs. Explored recent completion of PHQ-9 and alternate medical appt, noting of under reporting of sxs in comparison to pt's current reports, re-administering PHQ-9 for more accurate assessment. Identified depressive sxs proving to have decreased since prior scores, exploring factors contributing to reduction of sxs. - Pt detailed recounts of events over the past month since previous session, detailing of having secured  a new job and beginning work, challenges adjusting to schedule, working towards finding own routine, and increased tiredness and fatigue. - Processed efforts pt proves to be implementing in efforts to support healthy sleep and adjustment to work schedule, further discussing possibilities of incorporating daily routine/schedule developments into future visits as pt becomes used to work schedule. - Explored challenging anxious thoughts activity briefly, in efforts to support pt in beginning to explore frequent anxious thoughts experienced and manage irrational thoughts.  Patient responded well to interventions. Patient continues to meet criteria for MDD and GAD. Patient will continue to benefit from engagement in outpatient therapy due to being the least restrictive service to meet presenting needs.      08/08/2023    4:12 PM 07/11/2023    2:48 PM 06/20/2023    8:56 AM 06/03/2023    1:26 PM  GAD 7 : Generalized Anxiety Score  Nervous, Anxious, on Edge 2 3 3 3   Control/stop worrying 2 3 3 3   Worry too much - different things 3 3 3 3   Trouble relaxing 2 3 3 3   Restless 2 3 1 3   Easily annoyed or irritable 3 3 3 3   Afraid - awful might happen 3 3 3 3   Total GAD 7 Score 17 21 19 21   Anxiety Difficulty Somewhat difficult Extremely difficult Extremely difficult Extremely difficult      08/08/2023    4:23 PM 08/01/2023    9:36 AM 07/11/2023    2:49 PM 06/20/2023    8:58 AM 05/29/2023   11:13 AM  Depression screen PHQ 2/9  Decreased Interest 3 1 3 3 3   Down, Depressed, Hopeless 3 1 3 3 3   PHQ - 2 Score 6 2 6 6 6   Altered sleeping 3 1 3  3 3  Tired, decreased energy 3 1 3 3 3   Change in appetite 3 1 3 3 3   Feeling bad or failure about yourself  3 1 3 3 3   Trouble concentrating 3 1 3 3 3   Moving slowly or fidgety/restless 3 1 3 3 3   Suicidal thoughts 0 0 1 3 1   PHQ-9 Score 24 8 25 27 25   Difficult doing work/chores Somewhat difficult Extremely dIfficult Extremely dIfficult Very difficult Extremely  dIfficult   Flowsheet Row Counselor from 06/20/2023 in [facility] from 05/29/2023 in [facility] Counselor from 05/15/2023 in [facility]  C-SSRS RISK CATEGORY Low Risk Error: Q3, 4, or 5 should not be populated when Q2 is No Error: Q3, 4, or 5 should not be populated when Q2 is No       Suicidal/Homicidal: No.  Therapist Response: Clinician utilized CBT, solution focused, and supportive reflection interventions to support pt in navigation presenting sxs. Actively engaged pt in check-in, assessing moods and affect. Re-administered PHQ-9 and GAD-7, engaging pt further in exploration of reported presenting sxs and variances between past and current scores. Supported pt in exploring recounts of stressors and sxs experienced over the past month since previous session, eliciting pt's thoughts, feelings, and perspectives surrounding recent transitions, supporting pt in navigating some of the anxiety noted to be experiencing. Provided pt with Challenging Anxious Thoughts activity, to support pt in navigating cognitive distortions and irrational thoughts.    Clinician reassessed presentation of sxs and presence of safety concerns. Clinician provided support and empathy to patient during session.  Plan: Return again in 2 weeks.  Diagnosis:  Encounter Diagnoses  Name Primary?   MDD (major depressive disorder), recurrent severe, without psychosis (HCC)    GAD (generalized anxiety disorder) Yes    Collaboration of Care: Other None necessary at this time.  Patient/Guardian was advised Release of Information must be obtained prior to any record release in order to collaborate their care with an outside provider. Patient/Guardian was advised if they have not already done so to contact the registration department to sign all necessary forms in order for us  to release information regarding their  care.   Consent: Patient/Guardian gives verbal consent for treatment and assignment of benefits for services provided during this visit. Patient/Guardian expressed understanding and agreed to proceed.   Lynwood JONETTA Maris, MSW, LCSW 08/08/2023,  4:30 PM

## 2023-08-11 NOTE — Progress Notes (Deleted)
 Psychiatric Initial Adult Assessment  Patient Identification: Phillip Gill MRN:  [id number] Date of Evaluation:  08/11/2023 Referral Source: [name], MD  Assessment:  Phillip Gill is a 48 y.o. male with a history of MDD, GAD, panic attacks, HIV on HAART, OSA not on CPAP, chronic hepatitis B, anal condyloma, GERD, hypertension who presents in person to [facility] for medication management.  Patient reports significant depressive symptoms and anxiety with associated panic attacks.  He has found Lexapro and Abilify mildly improve his mood symptoms however for he still remains significantly depressed.  He has been unemployed since January 2024 after a breakdown and has been on a ball to return to work.  Due to the severity of his mental health symptoms and the impact it has had on his daily living, we discussed referral to [facility] which he was amenable to.  In addition, given his significant neurovegetative symptoms that appear related to his MDD, we elected to start Wellbutrin XL 150 mg daily as well as Inderal 10 mg twice daily as needed for social anxiety.  Will obtain a vitamin D level to identify if there are any other factors that would be contributing to his depressive symptoms.  Plan:  # Major Depressive Disorder, recurrent episode Past medication trials:  Status of problem: active Interventions: -- Referral for PHP  -- Recommend psychotherapy  -- Continue lexapro 20 milligrams daily -- Continue abilify 5 milligrams daily  -A1c ordered, lipid panel wnl  - ECG 01/2022 Qtc 413 --Start bupropion XL 150 mg daily  -No history of seizures  # Generalized anxiety disorder with panic attack Past medication trials:  Status of problem: Active Interventions: -- Lexapro as above -- Abilify as above --Start propranolol 10 mg twice daily as needed for anxiety  Return to care in 1 month  Patient was given contact information for behavioral health clinic and was  instructed to call 911 for emergencies.    Patient and plan of care will be discussed with the Attending MD, Dr. [name], who agrees with the above statement and plan.   Subjective:  Chief Complaint: Medication Management  History of Present Illness:   Patient reports presenting to psychiatry today due to severe depressive symptoms and anxiety with panic attacks.  He reports having a history of major depression as well as anxiety dating back to 1998 following grandmother's death.  He noticed he was having panic attacks almost daily which has slowly over the years decrease in frequency to only situations with very high anxiety.  He reports significant history of depression that seems to have worsened over the past few months.  He states symptoms of depressed mood, anhedonia, hypersomnia (10 to 15 hours of sleep), poor energy, poor concentration, increased appetite (30 pound weight gain in past 2 months), and passive suicidal ideation.  He denies ever having active suicidal ideation besides 1 night after grandmother's death to which she had been thinking about location and method but grandmother came to him in a dream and told him to live which has since resulted in him never having further suicidal ideation.  He endorses significant panic attacks especially in setting of elevated anxiety.  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  He describes panic attacks as episodes of dyspnea, numbness in arms and legs, chest pain, and heat intolerance.  He reports feeling has he has been unable to return to work due to his medical problems as well as  mental health problems.  He states he has been dealing with multiple psychosocial stressors including father being very sick, personal finance problems, lack of transportation, grandmother's death in 79 and mother's passing in 2018.  He has been living between 2 aunts and his brother's house.  He denies history of psychosis.  Denies  history of mania.  He denies history of trauma/abuse.  He reports significant alcohol use when he was college but has not drank significantly since then.  He states he drinks either a couple shots of liquor or 2-3 beers once every few months.  He denies any illicit substance use.  He formally smoked cigarettes and vape nicotine but has quit for some time now.  He denies marijuana/cannabis use.   Past Psychiatric History:  Diagnoses: depression and anxiety Medication trials: Lexapro, elavil (worsening pain), xanax, bupropion, celexa, valium, duloxetine (GI distress), hydroxyzine, propranolol, risperidone, ambien, gabapentin (hives) Previous psychiatrist/therapist: [facility], Dr. Flora Lipps (decade ago), therapist in the past  Hospitalizations: denies Suicide attempts: denies SIB: denies Hx of violence towards others: denies Current access to guns: denies Hx of trauma/abuse: denies  Substance Abuse History in the last 12 months:  No.  Past Medical History:  Past Medical History:  Diagnosis Date   Anxiety    Arthritis    "neck" (02/16/2015)   Chronic hepatitis B (HCC)    SECONDARY TO HIV   Chronic lower back pain    Depression    Fibromyalgia    Genital warts    HIV disease (HCC) 02/28/2015   HIV infection (HCC)    followed by Dr. Luciana Axe- sees him every 4 months   Hypertension    IBS (irritable bowel syndrome)    Migraine    "none in years" (02/16/2015   Overweight 07/20/2015   Renal insufficiency 12/29/2013    Past Surgical History:  Procedure Laterality Date   CO2 LASER APPLICATION N/A 02/11/2013   Procedure: CO2 LASER APPLICATION;  Surgeon: Romie Levee, MD;  Location: [facility];  Service: General;  Laterality: N/A;   FOOT SURGERY     HIGH RESOLUTION ANOSCOPY N/A 02/11/2013   Procedure: HIGH RESOLUTION ANOSCOPY WITH BIOPSY, LASER ABLATION;  Surgeon: Romie Levee, MD;  Location: [facility];  Service: General;  Laterality: N/A;    WISDOM TOOTH EXTRACTION      Family Psychiatric History:  denies  Family History:  Family History  Problem Relation Age of Onset   Hypertension Mother    Diabetes Mother    Stroke Mother        cerbral aneurysm   Pancreatic cancer Mother    Colon cancer Father    Hypertension Brother    Sleep apnea Cousin    Mental illness Neg Hx    Stomach cancer Neg Hx    Esophageal cancer Neg Hx     Social History:   Academic/Vocational: Unemployed Social History   Socioeconomic History   Marital status: Single    Spouse name: Not on file   Number of children: Not on file   Years of education: Not on file   Highest education level: Not on file  Occupational History   Not on file  Tobacco Use   Smoking status: Former    Current packs/day: 0.00    Average packs/day: 1 pack/day for 18.0 years (18.0 ttl pk-yrs)    Types: Cigarettes    Start date: 07/03/2003    Quit date: 07/02/2021    Years since quitting: 2.1  Passive exposure: Never   Smokeless tobacco: Never   Tobacco comments:    Pt vapes everyday  Vaping Use   Vaping status: Former   Quit date: 12/01/2022   Substances: Nicotine  Substance and Sexual Activity   Alcohol use: Yes    Comment: occasional use   Drug use: No   Sexual activity: Not on file    Comment: accepted condoms  Other Topics Concern   Not on file  Social History Narrative   Grew up in [place], now living in [place],-  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    Finished HS.   Doing online classes with [employer]- studying accounting   Previously 2 years of classes at [employer].    Has Partner- [name]- together for 1 year.    Has 2 girls- (born in 2000).          Social Drivers of Corporate investment banker Strain: High Risk (02/05/2023)   Overall Financial Resource Strain (CARDIA)    Difficulty of Paying Living Expenses: Very hard  Food Insecurity: Food Insecurity Present (02/05/2023)   Hunger Vital Sign    Worried About  Running Out of Food in the Last Year: Sometimes true    Ran Out of Food in the Last Year: Sometimes true  Transportation Needs: Unmet Transportation Needs (02/05/2023)   PRAPARE - Administrator, Civil Service (Medical): Yes    Lack of Transportation (Non-Medical): Yes  Physical Activity: Sufficiently Active (07/16/2018)   [facility]   Exercise Vital Sign    Days of Exercise per Week: 4 days    Minutes of Exercise per Session: 150+ min  Stress: Stress Concern Present (03/01/2023)   [employer] of Occupational Health - Occupational Stress Questionnaire    Feeling of Stress : To some extent  Social Connections: Unknown (05/23/2022)   [facility]   Social Network    Social Network: Not on file    Additional Social History: updated  Allergies:   Allergies  Allergen Reactions   Bactrim [Sulfamethoxazole W/Trimethoprim (Co-Trimoxazole)] Hives and Shortness Of Breath   Sulfa Antibiotics Hives and Shortness Of Breath   Cymbalta [Duloxetine Hcl] Diarrhea   Duloxetine Diarrhea   Other Hives and Swelling    [employer] toothpaste    Zoloft [Sertraline Hcl] Diarrhea   Clindamycin/Lincomycin Rash   Neurontin [Gabapentin] Rash    Current Medications: Current Outpatient Medications  Medication Sig Dispense Refill   amLODipine (NORVASC) 10 MG tablet Take 1 tablet (10 mg total) by mouth daily. 30 tablet 0   ARIPiprazole (ABILIFY) 5 MG tablet Take 1 tablet (5 mg total) by mouth daily. 90 tablet 0   bictegravir-emtricitabine-tenofovir AF (BIKTARVY) 50-200-25 MG TABS tablet TAKE 1 TABLET BY MOUTH EVERY DAY 30 tablet 5   buPROPion (WELLBUTRIN XL) 150 MG 24 hr tablet Take 1 tablet (150 mg total) by mouth daily. 30 tablet 0   diclofenac Sodium (VOLTAREN) 1 % GEL Apply 4 grams topically 4 (four) times daily to affected joint. 100 g 11   escitalopram (LEXAPRO) 20 MG tablet Take 1 tablet (20 mg  total) by mouth daily. 30 tablet 5   esomeprazole (NEXIUM) 40 MG capsule Take 1 capsule (40 mg total) by mouth daily before breakfast. 90 capsule 0   esomeprazole (NEXIUM) 40 MG capsule Take 1 capsule (40 mg total) by mouth 2 (two) times daily before a meal. 180 capsule 3  famotidine (PEPCID) 40 MG tablet Take 1 tablet (40 mg total) by mouth at bedtime. 90 tablet 0   finasteride (PROSCAR) 5 MG tablet Take 1 tablet (5 mg total) by mouth daily. 30 tablet 1   polyethylene glycol powder (GLYCOLAX/MIRALAX) 17 GM/SCOOP powder Dissolve 17 grams in liquid and drink by mouth every day. 476 g 0   propranolol (INDERAL) 10 MG tablet Take 1 tablet (10 mg total) by mouth 2 (two) times daily as needed. 60 tablet 0   tadalafil (CIALIS) 10 MG tablet Take 0.5 tablets (5 mg total) by mouth daily. 45 tablet 0   No current facility-administered medications for this visit.    ROS: Review of Systems   Objective:  Psychiatric Specialty Exam: There were no vitals taken for this visit.There is no height or weight on file to calculate BMI.  General Appearance: Casual  Eye Contact:  Fair  Speech:  Clear and Coherent and Normal Rate  Volume:  Decreased  Mood:  Anxious  Affect:  Appropriate and Congruent  Thought Content: Logical   Suicidal Thoughts:  No  Homicidal Thoughts:  No  Thought Process:  Coherent, Goal Directed, and Linear  Orientation:  Full (Time, Place, and Person)    Memory: Recent;   Fair  Judgment:  Intact  Insight:  Fair  Concentration:  Attention Span: Fair  Recall:  not formally assessed   Fund of Knowledge: Fair  Language: Fair  Psychomotor Activity:  Normal  Akathisia:  No  AIMS (if indicated): not done  Assets:  Manufacturing systems engineer Desire for Improvement Financial Resources/Insurance Housing Social Support Transportation  ADL's:  Intact  Cognition: WNL  Sleep:  Good   PE: General: well-appearing; no acute distress  Pulm: no increased work of breathing on room air   Strength & Muscle Tone: within normal limits Neuro: no focal neurological deficits observed  Gait & Station: normal  Metabolic Disorder Labs: Lab Results  Component Value Date   HGBA1C 5.5 10/02/2014   MPG 111 10/02/2014   No results found for: "PROLACTIN" Lab Results  Component Value Date   CHOL 199 08/01/2023   TRIG 158 (H) 08/01/2023   HDL 42 08/01/2023   CHOLHDL 4.7 08/01/2023   VLDL 16 02/14/2015   LDLCALC 130 (H) 08/01/2023   LDLCALC 91 10/23/2022   No results found for: "TSH"  Therapeutic Level Labs: No results found for: "LITHIUM" No results found for: "CBMZ" No results found for: "VALPROATE"  Screenings:  GAD-7    Flowsheet Row Counselor from 08/08/2023 in [facility] from 07/11/2023 in [facility] [place] Counselor from 06/20/2023 in [facility] at [place] Counselor from 06/03/2023 in [facility] at [facility] Visit from 03/05/2023 in [facility] [employer]  Total GAD-7 Score 17 21 19 21 21       [employer]    Flowsheet Row Counselor from 08/08/2023 in [facility] at [facility] Visit from 08/01/2023 in [facility] [place] Counselor from 07/11/2023 in [facility] at [facility] from 06/20/2023 in [facility] at [facility] from 05/29/2023 in [facility]  PHQ-2 Total Score 6 2 6 6 6   PHQ-9 Total Score 24 8 25 27 25       Flowsheet Row Counselor from 06/20/2023 in [facility] Out[facility] Health  at [facility] from 05/29/2023 in [facility] Counselor from 05/15/2023 in [facility]  C-SSRS RISK CATEGORY Low Risk Error: Q3, 4, or 5  should not be populated when Q2 is No Error: Q3, 4, or 5 should not be populated when Q2 is No       Collaboration of Care: Collaboration of Care:   Patient/Guardian was advised Release of Information must be obtained prior to any record release in order to collaborate their care with an outside provider. Patient/Guardian was advised if they have not already done so to contact the registration department to sign all necessary forms in order for [place] to release information regarding their care.   Consent: Patient/Guardian gives verbal consent for treatment and assignment of benefits for services provided during this visit. Patient/Guardian expressed understanding and agreed to proceed.   Park Pope, MD 2/9/20256:01 PM

## 2023-08-12 ENCOUNTER — Telehealth: Payer: Self-pay

## 2023-08-12 ENCOUNTER — Ambulatory Visit (HOSPITAL_COMMUNITY): Payer: MEDICAID | Admitting: Student

## 2023-08-22 ENCOUNTER — Other Ambulatory Visit (HOSPITAL_COMMUNITY): Payer: Self-pay

## 2023-08-22 ENCOUNTER — Ambulatory Visit (HOSPITAL_COMMUNITY): Payer: MEDICAID | Admitting: Licensed Clinical Social Worker

## 2023-08-23 ENCOUNTER — Other Ambulatory Visit: Payer: Self-pay | Admitting: Family

## 2023-08-23 ENCOUNTER — Other Ambulatory Visit (HOSPITAL_COMMUNITY): Payer: Self-pay

## 2023-08-23 ENCOUNTER — Other Ambulatory Visit: Payer: Self-pay | Admitting: Family Medicine

## 2023-08-23 ENCOUNTER — Other Ambulatory Visit: Payer: Self-pay

## 2023-08-23 DIAGNOSIS — F330 Major depressive disorder, recurrent, mild: Secondary | ICD-10-CM

## 2023-08-23 NOTE — Telephone Encounter (Signed)
 LOV: 04/30/2023 Please advise, see below.

## 2023-08-24 ENCOUNTER — Other Ambulatory Visit (HOSPITAL_COMMUNITY): Payer: Self-pay

## 2023-08-26 ENCOUNTER — Other Ambulatory Visit (HOSPITAL_COMMUNITY): Payer: Self-pay

## 2023-08-26 ENCOUNTER — Other Ambulatory Visit: Payer: Self-pay | Admitting: Pharmacist

## 2023-08-26 ENCOUNTER — Other Ambulatory Visit: Payer: Self-pay

## 2023-08-26 NOTE — Telephone Encounter (Signed)
 Patient has PCP.

## 2023-08-26 NOTE — Progress Notes (Signed)
 Specialty Pharmacy Ongoing Clinical Assessment Note  Phillip Gill is a 48 y.o. male who is being followed by the specialty pharmacy service for RxSp HIV   Patient's specialty medication(s) reviewed today: Bictegravir-Emtricitab-Tenofov (Biktarvy)   Missed doses in the last 4 weeks: 0   Patient/Caregiver did not have any additional questions or concerns.   Therapeutic benefit summary: Patient is achieving benefit   Adverse events/side effects summary: No adverse events/side effects   Patient's therapy is appropriate to: Continue    Goals Addressed             This Visit's Progress    Achieve Undetectable HIV Viral Load < 20   Improving    Patient is on track. Patient will maintain adherence.      Achieve Undetectable HIV Viral Load < 20   Improving    Patient is on track. Patient will maintain adherence      Maintain optimal adherence to therapy   Improving    Patient is on track. Patient will maintain adherence.      Minimize and address adverse drug events/drug interactions   No change    Patient is on track. Patient will be evaluated at upcoming provider appointment to assess progress.         Follow up:  6 months  Mckenleigh Tarlton L. Keyira Mondesir, PharmD, BCIDP, AAHIVP, CPP Clinical Pharmacist Practitioner Infectious Diseases Clinical Pharmacist [facility] 08/26/2023, 10:04 AM

## 2023-08-27 ENCOUNTER — Other Ambulatory Visit (HOSPITAL_COMMUNITY): Payer: Self-pay

## 2023-08-27 ENCOUNTER — Other Ambulatory Visit: Payer: Self-pay

## 2023-08-27 MED ORDER — ROSUVASTATIN CALCIUM 10 MG PO TABS
10.0000 mg | ORAL_TABLET | Freq: Every day | ORAL | 2 refills | Status: DC
  Filled 2023-08-27: qty 30, 30d supply, fill #0
  Filled 2023-09-19: qty 30, 30d supply, fill #1
  Filled 2023-11-04: qty 30, 30d supply, fill #2

## 2023-08-29 ENCOUNTER — Other Ambulatory Visit: Payer: Self-pay

## 2023-09-02 ENCOUNTER — Other Ambulatory Visit: Payer: Self-pay

## 2023-09-02 NOTE — Progress Notes (Signed)
 Specialty Pharmacy Refill Coordination Note  Phillip Gill is a 48 y.o. male contacted today regarding refills of specialty medication(s) Bictegravir-Emtricitab-Tenofov [name])   Patient requested Delivery   Delivery date: 09/04/23   Verified address: [address]   [place] [address]   Medication will be filled on 09/03/23.

## 2023-09-03 ENCOUNTER — Other Ambulatory Visit: Payer: Self-pay

## 2023-09-09 ENCOUNTER — Ambulatory Visit (INDEPENDENT_AMBULATORY_CARE_PROVIDER_SITE_OTHER): Payer: MEDICAID | Admitting: Licensed Clinical Social Worker

## 2023-09-09 ENCOUNTER — Ambulatory Visit (HOSPITAL_COMMUNITY): Payer: MEDICAID | Admitting: Student

## 2023-09-09 DIAGNOSIS — F411 Generalized anxiety disorder: Secondary | ICD-10-CM

## 2023-09-09 DIAGNOSIS — F332 Major depressive disorder, recurrent severe without psychotic features: Secondary | ICD-10-CM

## 2023-09-09 NOTE — Progress Notes (Deleted)
 Psychiatric Initial Adult Assessment  Patient Identification: Phillip Gill MRN:  [id number] Date of Evaluation:  09/09/2023 Referral Source: [name], MD  Assessment:  Phillip Gill is a 48 y.o. male with a history of MDD, GAD, panic attacks, HIV on HAART, OSA not on CPAP, chronic hepatitis B, anal condyloma, GERD, hypertension who presents in person to [facility] for medication management.  Patient reports significant depressive symptoms and anxiety with associated panic attacks.  He has found Lexapro and Abilify mildly improve his mood symptoms however for he still remains significantly depressed.  He has been unemployed since January 2024 after a breakdown and has been on a ball to return to work.  Due to the severity of his mental health symptoms and the impact it has had on his daily living, we discussed referral to [facility] which he was amenable to.  In addition, given his significant neurovegetative symptoms that appear related to his MDD, we elected to start Wellbutrin XL 150 mg daily as well as Inderal 10 mg twice daily as needed for social anxiety.  Will obtain a vitamin D level to identify if there are any other factors that would be contributing to his depressive symptoms.  Plan:  # Major Depressive Disorder, recurrent episode Past medication trials:  Status of problem: active Interventions: -- Referral for PHP  -- Recommend psychotherapy  -- Continue lexapro 20 milligrams daily -- Continue abilify 5 milligrams daily  -A1c ordered, lipid panel wnl  - ECG 01/2022 Qtc 413 --Start bupropion XL 150 mg daily  -No history of seizures  # Generalized anxiety disorder with panic attack Past medication trials:  Status of problem: Active Interventions: -- Lexapro as above -- Abilify as above --Start propranolol 10 mg twice daily as needed for anxiety  Return to care in 1 month  Patient was given contact information for behavioral health clinic and  was instructed to call 911 for emergencies.    Patient and plan of care will be discussed with the Attending MD, Dr. [name], who agrees with the above statement and plan.   Subjective:  Chief Complaint: Medication Management  History of Present Illness:   Patient reports presenting to psychiatry today due to severe depressive symptoms and anxiety with panic attacks.  He reports having a history of major depression as well as anxiety dating back to 1998 following grandmother's death.  He noticed he was having panic attacks almost daily which has slowly over the years decrease in frequency to only situations with very high anxiety.  He reports significant history of depression that seems to have worsened over the past few months.  He states symptoms of depressed mood, anhedonia, hypersomnia (10 to 15 hours of sleep), poor energy, poor concentration, increased appetite (30 pound weight gain in past 2 months), and passive suicidal ideation.  He denies ever having active suicidal ideation besides 1 night after grandmother's death to which she had been thinking about location and method but grandmother came to him in a dream and told him to live which has since resulted in him never having further suicidal ideation.  He endorses significant panic attacks especially in setting of elevated anxiety.  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  He describes panic attacks as episodes of dyspnea, numbness in arms and legs, chest pain, and heat intolerance.  He reports feeling has he has been unable to return to work due to his medical problems as well as  mental health problems.  He states he has been dealing with multiple psychosocial stressors including father being very sick, personal finance problems, lack of transportation, grandmother's death in 47 and mother's passing in 2018.  He has been living between 2 aunts and his brother's house.  He denies history of psychosis.   Denies history of mania.  He denies history of trauma/abuse.  He reports significant alcohol use when he was college but has not drank significantly since then.  He states he drinks either a couple shots of liquor or 2-3 beers once every few months.  He denies any illicit substance use.  He formally smoked cigarettes and vape nicotine but has quit for some time now.  He denies marijuana/cannabis use.   Past Psychiatric History:  Diagnoses: depression and anxiety Medication trials: Lexapro, elavil (worsening pain), xanax, bupropion, celexa, valium, duloxetine (GI distress), hydroxyzine, propranolol, risperidone, ambien, gabapentin (hives) Previous psychiatrist/therapist: [facility], Dr. Flora Lipps (decade ago), therapist in the past  Hospitalizations: denies Suicide attempts: denies SIB: denies Hx of violence towards others: denies Current access to guns: denies Hx of trauma/abuse: denies  Substance Abuse History in the last 12 months:  No.  Past Medical History:  Past Medical History:  Diagnosis Date   Anxiety    Arthritis    "neck" (02/16/2015)   Chronic hepatitis B (HCC)    SECONDARY TO HIV   Chronic lower back pain    Depression    Fibromyalgia    Genital warts    HIV disease (HCC) 02/28/2015   HIV infection (HCC)    followed by Dr. Luciana Axe- sees him every 4 months   Hypertension    IBS (irritable bowel syndrome)    Migraine    "none in years" (02/16/2015   Overweight 07/20/2015   Renal insufficiency 12/29/2013    Past Surgical History:  Procedure Laterality Date   CO2 LASER APPLICATION N/A 02/11/2013   Procedure: CO2 LASER APPLICATION;  Surgeon: Romie Levee, MD;  Location[facility];  Service: General;  Laterality: N/A;   FOOT SURGERY     HIGH RESOLUTION ANOSCOPY N/A 02/11/2013   Procedure: HIGH RESOLUTION ANOSCOPY WITH BIOPSY, LASER ABLATION;  Surgeon: Romie Levee, MD;  Location: [facility];  Service: General;   Laterality: N/A;   WISDOM TOOTH EXTRACTION      Family Psychiatric History:  denies  Family History:  Family History  Problem Relation Age of Onset   Hypertension Mother    Diabetes Mother    Stroke Mother        cerbral aneurysm   Pancreatic cancer Mother    Colon cancer Father    Hypertension Brother    Sleep apnea Cousin    Mental illness Neg Hx    Stomach cancer Neg Hx    Esophageal cancer Neg Hx     Social History:   Academic/Vocational: Unemployed Social History   Socioeconomic History   Marital status: Single    Spouse name: Not on file   Number of children: Not on file   Years of education: Not on file   Highest education level: Not on file  Occupational History   Not on file  Tobacco Use   Smoking status: Former    Current packs/day: 0.00    Average packs/day: 1 pack/day for 18.0 years (18.0 ttl pk-yrs)    Types: Cigarettes    Start date: 07/03/2003    Quit date: 07/02/2021    Years since quitting: 2.1  Passive exposure: Never   Smokeless tobacco: Never   Tobacco comments:    Pt vapes everyday  Vaping Use   Vaping status: Former   Quit date: 12/01/2022   Substances: Nicotine  Substance and Sexual Activity   Alcohol use: Yes    Comment: occasional use   Drug use: No   Sexual activity: Not on file    Comment: accepted condoms  Other Topics Concern   Not on file  Social History Narrative   Grew up in [place], now living in [place],-  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    Finished HS.   Doing online classes with [employer]- studying accounting   Previously 2 years of classes at [employer].    Has Partner- [name]- together for 1 year.    Has 2 girls- (born in 2000).          Social Drivers of Corporate investment banker Strain: High Risk (02/05/2023)   Overall Financial Resource Strain (CARDIA)    Difficulty of Paying Living Expenses: Very hard  Food Insecurity: Food Insecurity Present (02/05/2023)   Hunger Vital Sign     Worried About Running Out of Food in the Last Year: Sometimes true    Ran Out of Food in the Last Year: Sometimes true  Transportation Needs: Unmet Transportation Needs (02/05/2023)   PRAPARE - Administrator, Civil Service (Medical): Yes    Lack of Transportation (Non-Medical): Yes  Physical Activity: Sufficiently Active (07/16/2018)   [facility]   Exercise Vital Sign    Days of Exercise per Week: 4 days    Minutes of Exercise per Session: 150+ min  Stress: Stress Concern Present (03/01/2023)   [employer] of Occupational Health - Occupational Stress Questionnaire    Feeling of Stress : To some extent  Social Connections: Unknown (05/23/2022)   [facility]   Social Network    Social Network: Not on file    Additional Social History: updated  Allergies:   Allergies  Allergen Reactions   Bactrim [Sulfamethoxazole W/Trimethoprim (Co-Trimoxazole)] Hives and Shortness Of Breath   Sulfa Antibiotics Hives and Shortness Of Breath   Cymbalta [Duloxetine Hcl] Diarrhea   Duloxetine Diarrhea   Other Hives and Swelling    [employer] toothpaste    Zoloft [Sertraline Hcl] Diarrhea   Clindamycin/Lincomycin Rash   Neurontin [Gabapentin] Rash    Current Medications: Current Outpatient Medications  Medication Sig Dispense Refill   amLODipine (NORVASC) 10 MG tablet Take 1 tablet (10 mg total) by mouth daily. 30 tablet 0   ARIPiprazole (ABILIFY) 5 MG tablet Take 1 tablet (5 mg total) by mouth daily. 90 tablet 0   bictegravir-emtricitabine-tenofovir AF (BIKTARVY) 50-200-25 MG TABS tablet TAKE 1 TABLET BY MOUTH EVERY DAY 30 tablet 5   buPROPion (WELLBUTRIN XL) 150 MG 24 hr tablet Take 1 tablet (150 mg total) by mouth daily. 30 tablet 0   diclofenac Sodium (VOLTAREN) 1 % GEL Apply 4 grams topically 4 (four) times daily to affected joint. 100 g 11   escitalopram (LEXAPRO) 20 MG tablet Take 1  tablet (20 mg total) by mouth daily. 30 tablet 5   esomeprazole (NEXIUM) 40 MG capsule Take 1 capsule (40 mg total) by mouth daily before breakfast. 90 capsule 0   esomeprazole (NEXIUM) 40 MG capsule Take 1 capsule (40 mg total) by mouth 2 (two) times daily before a meal. 180 capsule 3  famotidine (PEPCID) 40 MG tablet Take 1 tablet (40 mg total) by mouth at bedtime. 90 tablet 0   finasteride (PROSCAR) 5 MG tablet Take 1 tablet (5 mg total) by mouth daily. 30 tablet 1   polyethylene glycol powder (GLYCOLAX/MIRALAX) 17 GM/SCOOP powder Dissolve 17 grams in liquid and drink by mouth every day. 476 g 0   propranolol (INDERAL) 10 MG tablet Take 1 tablet (10 mg total) by mouth 2 (two) times daily as needed. 60 tablet 0   rosuvastatin (CRESTOR) 10 MG tablet Take 1 tablet (10 mg total) by mouth daily. 30 tablet 2   tadalafil (CIALIS) 10 MG tablet Take 0.5 tablets (5 mg total) by mouth daily. 45 tablet 0   No current facility-administered medications for this visit.    ROS: Review of Systems   Objective:  Psychiatric Specialty Exam: There were no vitals taken for this visit.There is no height or weight on file to calculate BMI.  General Appearance: Casual  Eye Contact:  Fair  Speech:  Clear and Coherent and Normal Rate  Volume:  Decreased  Mood:  Anxious  Affect:  Appropriate and Congruent  Thought Content: Logical   Suicidal Thoughts:  No  Homicidal Thoughts:  No  Thought Process:  Coherent, Goal Directed, and Linear  Orientation:  Full (Time, Place, and Person)    Memory: Recent;   Fair  Judgment:  Intact  Insight:  Fair  Concentration:  Attention Span: Fair  Recall:  not formally assessed   Fund of Knowledge: Fair  Language: Fair  Psychomotor Activity:  Normal  Akathisia:  No  AIMS (if indicated): not done  Assets:  Manufacturing systems engineer Desire for Improvement Financial Resources/Insurance Housing Social Support Transportation  ADL's:  Intact  Cognition: WNL  Sleep:  Good    PE: General: well-appearing; no acute distress  Pulm: no increased work of breathing on room air  Strength & Muscle Tone: within normal limits Neuro: no focal neurological deficits observed  Gait & Station: normal  Metabolic Disorder Labs: Lab Results  Component Value Date   HGBA1C 5.5 10/02/2014   MPG 111 10/02/2014   No results found for: "PROLACTIN" Lab Results  Component Value Date   CHOL 199 08/01/2023   TRIG 158 (H) 08/01/2023   HDL 42 08/01/2023   CHOLHDL 4.7 08/01/2023   VLDL 16 02/14/2015   LDLCALC 130 (H) 08/01/2023   LDLCALC 91 10/23/2022   No results found for: "TSH"  Therapeutic Level Labs: No results found for: "LITHIUM" No results found for: "CBMZ" No results found for: "VALPROATE"  Screenings:  GAD-7    Flowsheet Row Counselor from 09/09/2023 in [facility] from 08/08/2023 in [facility] [place] Counselor from 07/11/2023 in [facility] [place] Counselor from 06/20/2023 in [facility] at [facility] Counselor from 06/03/2023 in [facility] at [facility]  Total GAD-7 Score 17 17 21 19 21       PHQ2-9    Flowsheet Row Counselor from 09/09/2023 in [facility] from 08/08/2023 in [facility] at [facility] Visit from 08/01/2023 in [facility] Counselor from 07/11/2023 in [facility] at [facility] from 06/20/2023 in [facility] at [facility] Total Score 6 6 2 6 6   PHQ-9 Total Score 23  24 8 25 27       Advertising copywriter from 06/20/2023 in [facility] at [facility] from 05/29/2023 in [facility]  Counselor from 05/15/2023 in [facility]  C-SSRS RISK CATEGORY Low Risk Error: Q3, 4, or 5 should not be populated when Q2 is No Error: Q3, 4, or 5 should not be populated when Q2 is No       Collaboration of Care: Collaboration of Care:   Patient/Guardian was advised Release of Information must be obtained prior to any record release in order to collaborate their care with an outside provider. Patient/Guardian was advised if they have not already done so to contact the registration department to sign all necessary forms in order for [place] to release information regarding their care.   Consent: Patient/Guardian gives verbal consent for treatment and assignment of benefits for services provided during this visit. Patient/Guardian expressed understanding and agreed to proceed.   Park Pope, MD 3/10/202512:47 PM

## 2023-09-09 NOTE — Progress Notes (Signed)
 THERAPIST PROGRESS NOTE   Session Date: 09/09/2023  Session Time: 1000 - 1100  Participation Level: Active  Behavioral Response: CasualAlertDysphoric  Type of Therapy: Individual Therapy  Treatment Goals addressed:  - LTG: Reduce frequency, intensity, and duration of depression symptoms so that daily functioning is improved (OP Depression) - LTG: Increase coping skills to manage depression and improve ability to perform daily activities (OP Depression) - LTG: "To not be depressed, have more energy, and stop over eating" (OP Depression) - STG: Report a decrease in anxiety symptoms as evidenced by an overall reduction in anxiety score by a minimum of 25% on the Generalized Anxiety Disorder Scale (GAD-7) (Anxiety) - STG: Elijha "Shan" will reduce frequency of avoidant behaviors by 50% as evidenced by self-report in therapy sessions (Anxiety) - LTG: "Be a more people person" Increase frequency at which pt is actively engaging in social activities. (Anxiety)  ProgressTowards Goals: Not Progressing  Interventions: CBT, Solution Focused, and Supportive  Summary: Phillip Gill is a 48 y.o. male with past psych history of MDD and GAD, presenting for follow up therapy session in efforts to improve management of depressive and anxious symptoms. Patient actively engaged in session, presenting in depressed moods, with congruent affect throughout.   - Pt actively engaged in introductory check-in, engaging in reassessment of depressive and anxious sxs via PHQ-9 and GAD-7, noting of worsening sxs and reporting of efforts to balance work and everything now proving to increase depressive sxs. 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 Detailed efforts at exploring alternate employment options, sharing of having began putting in applications for various different agencies. - Reviewed home environment stressors, pt identifying continuing to find factors frustrating, detailing of aunt accusing of taking her TV remote, and both aunts being overly dependent on pt, sharing further of how this proves to impact mood by stressors proving to irk pt and gets frustrated. Pt shared of currently looking for own place but having difficulties with section 8 limitations.  - Engaged in 94mo. review of tx plan, determining current progressions, and/or lack of, towards goals, noting depressive goals not progressing due to recent increase in stressors including work, sleep difficulties, increased fatigue, unable to sleep due to aunts needs at home, resulting in increased irritability. Progressing in some areas related to anxiousness, as started going to the gym 1-2x/wk, planning to inc. To 3-5x/wk.   Patient responded well to interventions. Patient continues to meet criteria for MDD and GAD. Patient will continue to benefit from engagement in outpatient therapy due to being the least restrictive service to meet presenting needs.      09/09/2023   10:13 AM 08/08/2023    4:12 PM 07/11/2023    2:48 PM 06/20/2023    8:56 AM  GAD 7 : Generalized Anxiety Score  Nervous, Anxious, on Edge 3 2 3 3   Control/stop worrying 3 2 3 3   Worry too much - different things 3 3 3 3   Trouble relaxing 1 2 3 3   Restless 1 2 3 1   Easily annoyed or irritable 3 3 3 3   Afraid - awful might happen 3 3 3 3   Total GAD 7 Score 17 17 21 19   Anxiety Difficulty Somewhat  difficult Somewhat difficult Extremely difficult Extremely difficult      09/09/2023   10:16 AM 08/08/2023    4:23 PM 08/01/2023    9:36 AM 07/11/2023    2:49 PM 06/20/2023    8:58 AM  Depression screen PHQ 2/9  Decreased Interest 3 3 1 3 3   Down,  Depressed, Hopeless 3 3 1 3 3   PHQ - 2 Score 6 6 2 6 6   Altered sleeping 3 3 1 3 3   Tired, decreased energy 3 3 1 3 3   Change in appetite 1 3 1 3 3   Feeling bad or failure about yourself  3 3 1 3 3   Trouble concentrating 3 3 1 3 3   Moving slowly or fidgety/restless 3 3 1 3 3   Suicidal thoughts 1 0 0 1 3  PHQ-9 Score 23 24 8 25 27   Difficult doing work/chores Extremely dIfficult Somewhat difficult Extremely dIfficult Extremely dIfficult Very difficult   Flowsheet Row Counselor from 06/20/2023 in [facility] from 05/29/2023 in [facility] Counselor from 05/15/2023 in [facility]  C-SSRS RISK CATEGORY Low Risk Error: Q3, 4, or 5 should not be populated when Q2 is No Error: Q3, 4, or 5 should not be populated when Q2 is No       Suicidal/Homicidal: No. pSI, denies plan, intent.  Therapist Response: Clinician utilized CBT, solution focused, and supportive reflection interventions to support pt in navigation presenting sxs. Actively engaged pt in introductory check-in, assessing presenting moods and affect, engaging in brief review of recent weeks.  - Reassessed presenting depressive and anxious sxs via PHQ-9 and GAD-7, engaging pt further in processing of current scores, reported sxs, and extensive exploration of contributing factors and/or stressors. - Actively listened to pts' recounts of recent events, providing support in reflection of recent events, challenging negative thoughts, supporting pt in attempts to reframe perspectives. - Evoked pt's thoughts, perspectives, and feelings surrounding presenting stressors, and processed efforts to manage stressors. - Engaged pt in 63mo. Review of tx goals, eliciting pt's perspectives of progress throughout tx thus far, evoking pt's perspectives of objective evidence.  Clinician reassessed presentation of sxs and presence of safety concerns.  Clinician provided support and empathy to patient during session.  Plan: Return again in 2 weeks.  Diagnosis:  Encounter Diagnoses  Name Primary?   GAD (generalized anxiety disorder) Yes   MDD (major depressive disorder), recurrent severe, without psychosis (HCC)      Collaboration of Care: Other None necessary at this time.  Patient/Guardian was advised Release of Information must be obtained prior to any record release in order to collaborate their care with an outside provider. Patient/Guardian was advised if they have not already done so to contact the registration department to sign all necessary forms in order for [place] to release information regarding their care.   Consent: Patient/Guardian gives verbal consent for treatment and assignment of benefits for services provided during this visit. Patient/Guardian expressed understanding and agreed to proceed.   Leisa Lenz, MSW, LCSW 09/09/2023,  10:47 AM

## 2023-09-11 ENCOUNTER — Telehealth: Payer: Self-pay

## 2023-09-11 NOTE — Telephone Encounter (Signed)
 Received the following appointment request from patient.     Appointment Request From: Phillip Gill   With Provider: Jeanine Luz [facility] [place]]   Preferred Date Range: 09/13/2023 - 09/18/2023   Preferred Times: Wednesday Morning, Friday Morning   Reason for visit: Office Visit   Health Maintenance Topic:    Comments: Sworing feet and ankles

## 2023-09-15 NOTE — Progress Notes (Unsigned)
 Psychiatric Initial Adult Assessment  Patient Identification: Phillip Gill MRN:  [id number] Date of Evaluation:  09/15/2023 Referral Source: [name], MD  Assessment:  Phillip Gill is a 48 y.o. male with a history of MDD, GAD, panic attacks, HIV on HAART, OSA not on CPAP, chronic hepatitis B, anal condyloma, GERD, hypertension who presents in person to [facility] for medication management.  Patient reports significant depressive symptoms and anxiety with associated panic attacks.  He has found Lexapro and Abilify mildly improve his mood symptoms however for he still remains significantly depressed.  He has been unemployed since January 2024 after a breakdown and has been on a ball to return to work.  Due to the severity of his mental health symptoms and the impact it has had on his daily living, we discussed referral to [facility] which he was amenable to.  In addition, given his significant neurovegetative symptoms that appear related to his MDD, we elected to start Wellbutrin XL 150 mg daily as well as Inderal 10 mg twice daily as needed for social anxiety.  Will obtain a vitamin D level to identify if there are any other factors that would be contributing to his depressive symptoms.  Plan:  # Major Depressive Disorder, recurrent episode Past medication trials:  Status of problem: active Interventions: -- Referral for PHP  -- Recommend psychotherapy  -- Continue lexapro 20 milligrams daily -- Continue abilify 5 milligrams daily  -A1c ordered, lipid panel wnl  - ECG 01/2022 Qtc 413 --Start bupropion XL 150 mg daily  -No history of seizures  # Generalized anxiety disorder with panic attack Past medication trials:  Status of problem: Active Interventions: -- Lexapro as above -- Abilify as above --Start propranolol 10 mg twice daily as needed for anxiety  Return to care in 1 month  Patient was given contact information for behavioral health clinic and  was instructed to call 911 for emergencies.    Patient and plan of care will be discussed with the Attending MD, Dr. [name], who agrees with the above statement and plan.   Subjective:  Chief Complaint: Medication Management  History of Present Illness:   Patient reports presenting to psychiatry today due to severe depressive symptoms and anxiety with panic attacks.  He reports having a history of major depression as well as anxiety dating back to 1998 following grandmother's death.  He noticed he was having panic attacks almost daily which has slowly over the years decrease in frequency to only situations with very high anxiety.  He reports significant history of depression that seems to have worsened over the past few months.  He states symptoms of depressed mood, anhedonia, hypersomnia (10 to 15 hours of sleep), poor energy, poor concentration, increased appetite (30 pound weight gain in past 2 months), and passive suicidal ideation.  He denies ever having active suicidal ideation besides 1 night after grandmother's death to which she had been thinking about location and method but grandmother came to him in a dream and told him to live which has since resulted in him never having further suicidal ideation.  He endorses significant panic attacks especially in setting of elevated anxiety.  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  He describes panic attacks as episodes of dyspnea, numbness in arms and legs, chest pain, and heat intolerance.  He reports feeling has he has been unable to return to work due to his medical problems as well as  mental health problems.  He states he has been dealing with multiple psychosocial stressors including father being very sick, personal finance problems, lack of transportation, grandmother's death in 44 and mother's passing in 2018.  He has been living between 2 aunts and his brother's house.  He denies history of psychosis.   Denies history of mania.  He denies history of trauma/abuse.  He reports significant alcohol use when he was college but has not drank significantly since then.  He states he drinks either a couple shots of liquor or 2-3 beers once every few months.  He denies any illicit substance use.  He formally smoked cigarettes and vape nicotine but has quit for some time now.  He denies marijuana/cannabis use.   Past Psychiatric History:  Diagnoses: depression and anxiety Medication trials: Lexapro, elavil (worsening pain), xanax, bupropion, celexa, valium, duloxetine (GI distress), hydroxyzine, propranolol, risperidone, ambien, gabapentin (hives) Previous psychiatrist/therapist: [facility], Dr. Flora Lipps (decade ago), therapist in the past  Hospitalizations: denies Suicide attempts: denies SIB: denies Hx of violence towards others: denies Current access to guns: denies Hx of trauma/abuse: denies  Substance Abuse History in the last 12 months:  No.  Past Medical History:  Past Medical History:  Diagnosis Date   Anxiety    Arthritis    "neck" (02/16/2015)   Chronic hepatitis B (HCC)    SECONDARY TO HIV   Chronic lower back pain    Depression    Fibromyalgia    Genital warts    HIV disease (HCC) 02/28/2015   HIV infection (HCC)    followed by Dr. Luciana Axe- sees him every 4 months   Hypertension    IBS (irritable bowel syndrome)    Migraine    "none in years" (02/16/2015   Overweight 07/20/2015   Renal insufficiency 12/29/2013    Past Surgical History:  Procedure Laterality Date   CO2 LASER APPLICATION N/A 02/11/2013   Procedure: CO2 LASER APPLICATION;  Surgeon: Romie Levee, MD;  Location: [facility];  Service: General;  Laterality: N/A;   FOOT SURGERY     HIGH RESOLUTION ANOSCOPY N/A 02/11/2013   Procedure: HIGH RESOLUTION ANOSCOPY WITH BIOPSY, LASER ABLATION;  Surgeon: Romie Levee, MD;  Location: [facility];  Service: General;   Laterality: N/A;   WISDOM TOOTH EXTRACTION      Family Psychiatric History:  denies  Family History:  Family History  Problem Relation Age of Onset   Hypertension Mother    Diabetes Mother    Stroke Mother        cerbral aneurysm   Pancreatic cancer Mother    Colon cancer Father    Hypertension Brother    Sleep apnea Cousin    Mental illness Neg Hx    Stomach cancer Neg Hx    Esophageal cancer Neg Hx     Social History:   Academic/Vocational: Unemployed Social History   Socioeconomic History   Marital status: Single    Spouse name: Not on file   Number of children: Not on file   Years of education: Not on file   Highest education level: Not on file  Occupational History   Not on file  Tobacco Use   Smoking status: Former    Current packs/day: 0.00    Average packs/day: 1 pack/day for 18.0 years (18.0 ttl pk-yrs)    Types: Cigarettes    Start date: 07/03/2003    Quit date: 07/02/2021    Years since quitting: 2.2  Passive exposure: Never   Smokeless tobacco: Never   Tobacco comments:    Pt vapes everyday  Vaping Use   Vaping status: Former   Quit date: 12/01/2022   Substances: Nicotine  Substance and Sexual Activity   Alcohol use: Yes    Comment: occasional use   Drug use: No   Sexual activity: Not on file    Comment: accepted condoms  Other Topics Concern   Not on file  Social History Narrative   Grew up in [place], now living in [place],-  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    Finished HS.   Doing online classes with [employer]- studying accounting   Previously 2 years of classes at [employer].    Has Partner- [name]- together for 1 year.    Has 2 girls- (born in 2000).          Social Drivers of Corporate investment banker Strain: High Risk (02/05/2023)   Overall Financial Resource Strain (CARDIA)    Difficulty of Paying Living Expenses: Very hard  Food Insecurity: Food Insecurity Present (02/05/2023)   Hunger Vital Sign     Worried About Running Out of Food in the Last Year: Sometimes true    Ran Out of Food in the Last Year: Sometimes true  Transportation Needs: Unmet Transportation Needs (02/05/2023)   PRAPARE - Administrator, Civil Service (Medical): Yes    Lack of Transportation (Non-Medical): Yes  Physical Activity: Sufficiently Active (07/16/2018)   [facility]   Exercise Vital Sign    Days of Exercise per Week: 4 days    Minutes of Exercise per Session: 150+ min  Stress: Stress Concern Present (03/01/2023)   [employer] of Occupational Health - Occupational Stress Questionnaire    Feeling of Stress : To some extent  Social Connections: Unknown (05/23/2022)   [facility]   Social Network    Social Network: Not on file    Additional Social History: updated  Allergies:   Allergies  Allergen Reactions   Bactrim [Sulfamethoxazole W/Trimethoprim (Co-Trimoxazole)] Hives and Shortness Of Breath   Sulfa Antibiotics Hives and Shortness Of Breath   Cymbalta [Duloxetine Hcl] Diarrhea   Duloxetine Diarrhea   Other Hives and Swelling    [employer] toothpaste    Zoloft [Sertraline Hcl] Diarrhea   Clindamycin/Lincomycin Rash   Neurontin [Gabapentin] Rash    Current Medications: Current Outpatient Medications  Medication Sig Dispense Refill   amLODipine (NORVASC) 10 MG tablet Take 1 tablet (10 mg total) by mouth daily. 30 tablet 0   ARIPiprazole (ABILIFY) 5 MG tablet Take 1 tablet (5 mg total) by mouth daily. 90 tablet 0   bictegravir-emtricitabine-tenofovir AF (BIKTARVY) 50-200-25 MG TABS tablet TAKE 1 TABLET BY MOUTH EVERY DAY 30 tablet 5   buPROPion (WELLBUTRIN XL) 150 MG 24 hr tablet Take 1 tablet (150 mg total) by mouth daily. 30 tablet 0   diclofenac Sodium (VOLTAREN) 1 % GEL Apply 4 grams topically 4 (four) times daily to affected joint. 100 g 11   escitalopram (LEXAPRO) 20 MG tablet Take 1  tablet (20 mg total) by mouth daily. 30 tablet 5   esomeprazole (NEXIUM) 40 MG capsule Take 1 capsule (40 mg total) by mouth daily before breakfast. 90 capsule 0   esomeprazole (NEXIUM) 40 MG capsule Take 1 capsule (40 mg total) by mouth 2 (two) times daily before a meal. 180 capsule 3  famotidine (PEPCID) 40 MG tablet Take 1 tablet (40 mg total) by mouth at bedtime. 90 tablet 0   finasteride (PROSCAR) 5 MG tablet Take 1 tablet (5 mg total) by mouth daily. 30 tablet 1   polyethylene glycol powder (GLYCOLAX/MIRALAX) 17 GM/SCOOP powder Dissolve 17 grams in liquid and drink by mouth every day. 476 g 0   propranolol (INDERAL) 10 MG tablet Take 1 tablet (10 mg total) by mouth 2 (two) times daily as needed. 60 tablet 0   rosuvastatin (CRESTOR) 10 MG tablet Take 1 tablet (10 mg total) by mouth daily. 30 tablet 2   tadalafil (CIALIS) 10 MG tablet Take 0.5 tablets (5 mg total) by mouth daily. 45 tablet 0   No current facility-administered medications for this visit.    ROS: Review of Systems   Objective:  Psychiatric Specialty Exam: There were no vitals taken for this visit.There is no height or weight on file to calculate BMI.  General Appearance: Casual  Eye Contact:  Fair  Speech:  Clear and Coherent and Normal Rate  Volume:  Decreased  Mood:  Anxious  Affect:  Appropriate and Congruent  Thought Content: Logical   Suicidal Thoughts:  No  Homicidal Thoughts:  No  Thought Process:  Coherent, Goal Directed, and Linear  Orientation:  Full (Time, Place, and Person)    Memory: Recent;   Fair  Judgment:  Intact  Insight:  Fair  Concentration:  Attention Span: Fair  Recall:  not formally assessed   Fund of Knowledge: Fair  Language: Fair  Psychomotor Activity:  Normal  Akathisia:  No  AIMS (if indicated): not done  Assets:  Manufacturing systems engineer Desire for Improvement Financial Resources/Insurance Housing Social Support Transportation  ADL's:  Intact  Cognition: WNL  Sleep:  Good    PE: General: well-appearing; no acute distress  Pulm: no increased work of breathing on room air  Strength & Muscle Tone: within normal limits Neuro: no focal neurological deficits observed  Gait & Station: normal  Metabolic Disorder Labs: Lab Results  Component Value Date   HGBA1C 5.5 10/02/2014   MPG 111 10/02/2014   No results found for: "PROLACTIN" Lab Results  Component Value Date   CHOL 199 08/01/2023   TRIG 158 (H) 08/01/2023   HDL 42 08/01/2023   CHOLHDL 4.7 08/01/2023   VLDL 16 02/14/2015   LDLCALC 130 (H) 08/01/2023   LDLCALC 91 10/23/2022   No results found for: "TSH"  Therapeutic Level Labs: No results found for: "LITHIUM" No results found for: "CBMZ" No results found for: "VALPROATE"  Screenings:  GAD-7    Flowsheet Row Counselor from 09/09/2023 in [facility] from 08/08/2023 in [facility] [place] Counselor from 07/11/2023 in [facility] [place] Counselor from 06/20/2023 in [facility] at [facility] Counselor from 06/03/2023 in [facility] at [facility]  Total GAD-7 Score 17 17 21 19 21       PHQ2-9    Flowsheet Row Counselor from 09/09/2023 in [facility] at [facility] from 08/08/2023 in [facility] at [facility] Visit from 08/01/2023 in [facility] [place] Counselor from 07/11/2023 in [facility] at [facility] from 06/20/2023 in [facility] at [facility] Total Score 6 6 2 6 6   PHQ-9 Total Score 23  24 8 25 27       Advertising copywriter from 06/20/2023 in [facility] at [facility] from 05/29/2023 in [facility]  Counselor from 05/15/2023 in [facility]  C-SSRS RISK CATEGORY Low Risk Error: Q3, 4, or 5 should not be populated when Q2 is No Error: Q3, 4, or 5 should not be populated when Q2 is No       Collaboration of Care: Collaboration of Care:   Patient/Guardian was advised Release of Information must be obtained prior to any record release in order to collaborate their care with an outside provider. Patient/Guardian was advised if they have not already done so to contact the registration department to sign all necessary forms in order for [place] to release information regarding their care.   Consent: Patient/Guardian gives verbal consent for treatment and assignment of benefits for services provided during this visit. Patient/Guardian expressed understanding and agreed to proceed.   Park Pope, MD 3/16/20254:48 PM

## 2023-09-16 ENCOUNTER — Other Ambulatory Visit: Payer: Self-pay

## 2023-09-16 ENCOUNTER — Encounter (HOSPITAL_BASED_OUTPATIENT_CLINIC_OR_DEPARTMENT_OTHER): Payer: Self-pay | Admitting: *Deleted

## 2023-09-16 ENCOUNTER — Encounter (HOSPITAL_BASED_OUTPATIENT_CLINIC_OR_DEPARTMENT_OTHER): Payer: Self-pay

## 2023-09-16 ENCOUNTER — Other Ambulatory Visit (HOSPITAL_BASED_OUTPATIENT_CLINIC_OR_DEPARTMENT_OTHER): Payer: Self-pay

## 2023-09-16 ENCOUNTER — Emergency Department (HOSPITAL_BASED_OUTPATIENT_CLINIC_OR_DEPARTMENT_OTHER)
Admission: EM | Admit: 2023-09-16 | Discharge: 2023-09-16 | Disposition: A | Discharge status: Home / Self Care | Payer: MEDICAID | Attending: Emergency Medicine | Admitting: Emergency Medicine

## 2023-09-16 DIAGNOSIS — R600 Localized edema: Secondary | ICD-10-CM | POA: Diagnosis not present

## 2023-09-16 DIAGNOSIS — R2243 Localized swelling, mass and lump, lower limb, bilateral: Secondary | ICD-10-CM | POA: Diagnosis present

## 2023-09-16 DIAGNOSIS — M7989 Other specified soft tissue disorders: Secondary | ICD-10-CM

## 2023-09-16 DIAGNOSIS — Z21 Asymptomatic human immunodeficiency virus [HIV] infection status: Secondary | ICD-10-CM | POA: Insufficient documentation

## 2023-09-16 LAB — CBC WITH DIFFERENTIAL/PLATELET
Abs Immature Granulocytes: 0.05 10*3/uL (ref 0.00–0.07)
Basophils Absolute: 0 10*3/uL (ref 0.0–0.1)
Basophils Relative: 0 %
Eosinophils Absolute: 0.1 10*3/uL (ref 0.0–0.5)
Eosinophils Relative: 1 %
HCT: 42.8 % (ref 39.0–52.0)
Hemoglobin: 13.9 g/dL (ref 13.0–17.0)
Immature Granulocytes: 1 %
Lymphocytes Relative: 34 %
Lymphs Abs: 3.6 10*3/uL (ref 0.7–4.0)
MCH: 29 pg (ref 26.0–34.0)
MCHC: 32.5 g/dL (ref 30.0–36.0)
MCV: 89.4 fL (ref 80.0–100.0)
Monocytes Absolute: 1 10*3/uL (ref 0.1–1.0)
Monocytes Relative: 9 %
Neutro Abs: 5.9 10*3/uL (ref 1.7–7.7)
Neutrophils Relative %: 55 %
Platelets: 254 10*3/uL (ref 150–400)
RBC: 4.79 MIL/uL (ref 4.22–5.81)
RDW: 13.6 % (ref 11.5–15.5)
WBC: 10.6 10*3/uL — ABNORMAL HIGH (ref 4.0–10.5)
nRBC: 0 % (ref 0.0–0.2)

## 2023-09-16 LAB — COMPREHENSIVE METABOLIC PANEL
ALT: 19 U/L (ref 0–44)
AST: 21 U/L (ref 15–41)
Albumin: 4.5 g/dL (ref 3.5–5.0)
Alkaline Phosphatase: 84 U/L (ref 38–126)
Anion gap: 4 — ABNORMAL LOW (ref 5–15)
BUN: 10 mg/dL (ref 6–20)
CO2: 30 mmol/L (ref 22–32)
Calcium: 9.4 mg/dL (ref 8.9–10.3)
Chloride: 104 mmol/L (ref 98–111)
Creatinine, Ser: 1.19 mg/dL (ref 0.61–1.24)
GFR, Estimated: >60 mL/min (ref >60)
Glucose, Bld: 131 mg/dL — ABNORMAL HIGH (ref 70–99)
Potassium: 3.8 mmol/L (ref 3.5–5.1)
Sodium: 138 mmol/L (ref 135–145)
Total Bilirubin: 0.5 mg/dL (ref 0.0–1.2)
Total Protein: 8.2 g/dL — ABNORMAL HIGH (ref 6.5–8.1)

## 2023-09-16 MED ORDER — FUROSEMIDE 20 MG PO TABS
20.0000 mg | ORAL_TABLET | Freq: Every day | ORAL | 0 refills | Status: DC
  Filled 2023-09-16 – 2023-09-17 (×2): qty 3, 3d supply, fill #0

## 2023-09-16 NOTE — ED Notes (Signed)
 Dr. Pilar Plate did not want any orders completed until he is evaluated by the MD.

## 2023-09-16 NOTE — ED Provider Notes (Signed)
 [place] EMERGENCY DEPARTMENT AT [facility] Provider Note   CSN: [id number] Arrival date & time: 09/16/23  0340     History  Chief Complaint  Patient presents with   Other    Foot pain for 3 months     Phillip Gill is a 48 y.o. male.  Patient here with bilateral lower leg swelling on and off for the last couple months.  Nothing makes it worse or better.  History of HIV.  Denies any chest pain shortness of breath weakness numbness tingling.  Denies any fevers or chills.  Denies any recent surgery or travel.  He will have tingling in his feet at times.  The history is provided by the patient.       Home Medications Prior to Admission medications   Medication Sig Start Date End Date Taking? Authorizing Provider  furosemide (LASIX) 20 MG tablet Take 1 tablet (20 mg total) by mouth daily for 3 days. 09/16/23 09/19/23 Yes Mylz Yuan, DO  amLODipine (NORVASC) 10 MG tablet Take 1 tablet (10 mg total) by mouth daily. 06/24/23   Veryl Speak, FNP  ARIPiprazole (ABILIFY) 5 MG tablet Take 1 tablet (5 mg total) by mouth daily. 05/27/23   Garnette Gunner, MD  bictegravir-emtricitabine-tenofovir AF (BIKTARVY) 50-200-25 MG TABS tablet TAKE 1 TABLET BY MOUTH EVERY DAY 08/01/23   Veryl Speak, FNP  buPROPion (WELLBUTRIN XL) 150 MG 24 hr tablet Take 1 tablet (150 mg total) by mouth daily. 05/06/23   Park Pope, MD  diclofenac Sodium (VOLTAREN) 1 % GEL Apply 4 grams topically 4 (four) times daily to affected joint. 12/19/22   Rodolph Bong, MD  escitalopram (LEXAPRO) 20 MG tablet Take 1 tablet (20 mg total) by mouth daily. 03/29/23   Eulis Foster, FNP  esomeprazole (NEXIUM) 40 MG capsule Take 1 capsule (40 mg total) by mouth daily before breakfast. 06/13/23 09/11/23  Hilarie Fredrickson, MD  esomeprazole (NEXIUM) 40 MG capsule Take 1 capsule (40 mg total) by mouth 2 (two) times daily before a meal. 06/20/23   Hilarie Fredrickson, MD  famotidine (PEPCID) 40 MG tablet Take 1 tablet  (40 mg total) by mouth at bedtime. 08/06/23 11/04/23  Garnette Gunner, MD  finasteride (PROSCAR) 5 MG tablet Take 1 tablet (5 mg total) by mouth daily. 07/08/23   Garnette Gunner, MD  polyethylene glycol powder (GLYCOLAX/MIRALAX) 17 GM/SCOOP powder Dissolve 17 grams in liquid and drink by mouth every day. 08/06/23   Garnette Gunner, MD  propranolol (INDERAL) 10 MG tablet Take 1 tablet (10 mg total) by mouth 2 (two) times daily as needed. 05/06/23   Park Pope, MD  rosuvastatin (CRESTOR) 10 MG tablet Take 1 tablet (10 mg total) by mouth daily. 08/27/23   Veryl Speak, FNP  tadalafil (CIALIS) 10 MG tablet Take 0.5 tablets (5 mg total) by mouth daily. 08/06/23 11/04/23  Garnette Gunner, MD      Allergies    Bactrim [sulfamethoxazole w/trimethoprim (co-trimoxazole)], Sulfa antibiotics, Cymbalta [duloxetine hcl], Duloxetine, Other, Zoloft [sertraline hcl], Clindamycin/lincomycin, and Neurontin [gabapentin]    Review of Systems   Review of Systems  Physical Exam Updated Vital Signs BP (!) 159/106   Pulse 90   Temp 98.3 F (36.8 C)   Resp 20   SpO2 95%  Physical Exam Vitals and nursing note reviewed.  Constitutional:      General: He is not in acute distress.    Appearance: He is well-developed. He is not  ill-appearing.  HENT:     Head: Normocephalic and atraumatic.     Nose: Nose normal.     Mouth/Throat:     Mouth: Mucous membranes are moist.  Eyes:     Extraocular Movements: Extraocular movements intact.     Conjunctiva/sclera: Conjunctivae normal.     Pupils: Pupils are equal, round, and reactive to light.  Cardiovascular:     Rate and Rhythm: Normal rate and regular rhythm.     Pulses: Normal pulses.     Heart sounds: Normal heart sounds. No murmur heard. Pulmonary:     Effort: Pulmonary effort is normal. No respiratory distress.     Breath sounds: Normal breath sounds.  Abdominal:     Palpations: Abdomen is soft.     Tenderness: There is no abdominal tenderness.   Musculoskeletal:        General: No swelling.     Cervical back: Normal range of motion and neck supple.     Right lower leg: Edema present.     Left lower leg: Edema present.     Comments: 1+ pitting edema bilaterally in the legs with some venous stasis changes  Skin:    General: Skin is warm and dry.     Capillary Refill: Capillary refill takes less than 2 seconds.  Neurological:     Mental Status: He is alert.  Psychiatric:        Mood and Affect: Mood normal.     ED Results / Procedures / Treatments   Labs (all labs ordered are listed, but only abnormal results are displayed) Labs Reviewed  CBC WITH DIFFERENTIAL/PLATELET - Abnormal; Notable for the following components:      Result Value   WBC 10.6 (*)    All other components within normal limits  COMPREHENSIVE METABOLIC PANEL - Abnormal; Notable for the following components:   Glucose, Bld 131 (*)    Total Protein 8.2 (*)    Anion gap 4 (*)    All other components within normal limits    EKG None  Radiology No results found.  Procedures Procedures    Medications Ordered in ED Medications - No data to display  ED Course/ Medical Decision Making/ A&P                                 Medical Decision Making Amount and/or Complexity of Data Reviewed Labs: ordered.  Risk Prescription drug management.   Phillip Gill is here with leg swelling.  Unremarkable vitals.  No fever.  Mild pitting edema in his lower legs with venous stasis changes.  He has no shortness of breath.  He has clear breath sounds.  No respiratory distress.  Differential diagnosis this is likely venous insufficiency but will evaluate for any liver or kidney dysfunction.  Have no concern for heart failure.  Clear breath sounds.  No signs of respiratory distress.  No respiratory symptoms.  Labs were checked that showed no significant leukocytosis anemia or electrolyte abnormality.  Creatinine unremarkable.  Liver function unremarkable.   Will put him on Lasix for a couple days have him follow-up with his primary care doctor.  Recommend compression socks leg elevation.  This has been on and off for the last couple months when I suspect likely venous insufficiency.  Discharged in good condition.  Neurovascular neuromuscular intact.  Understands return precautions.  Understands follow-up.  This chart was dictated using voice recognition software.  Despite  best efforts to proofread,  errors can occur which can change the documentation meaning.         Final Clinical Impression(s) / ED Diagnoses Final diagnoses:  Leg swelling    Rx / DC Orders ED Discharge Orders          Ordered    furosemide (LASIX) 20 MG tablet  Daily        09/16/23 0850              Virgina Norfolk, DO 09/16/23 [phone]

## 2023-09-16 NOTE — Discharge Instructions (Signed)
 Follow-up with your primary care doctor to further discuss leg swelling.  Take fluid pill for the next couple days to help.  Recommend compression socks leg elevation as we discussed.

## 2023-09-16 NOTE — ED Triage Notes (Signed)
 Pt c/o bilateral foot pain/tingling to bilateral feet for the past 3 months. Denies any other complaints. Denies injury. Has not taken anything for pain.

## 2023-09-17 ENCOUNTER — Telehealth: Payer: Self-pay

## 2023-09-17 ENCOUNTER — Other Ambulatory Visit (HOSPITAL_COMMUNITY): Payer: Self-pay

## 2023-09-17 ENCOUNTER — Other Ambulatory Visit (HOSPITAL_BASED_OUTPATIENT_CLINIC_OR_DEPARTMENT_OTHER): Payer: Self-pay

## 2023-09-17 NOTE — Transitions of Care (Post Inpatient/ED Visit) (Signed)
 09/17/2023  Name: Phillip Gill MRN: [id number] DOB: [birth year]  Today's TOC FU Call Status: Today's TOC FU Call Status:: Successful TOC FU Call Completed TOC FU Call Complete Date: 09/17/23 Patient's Name and Date of Birth confirmed.  Transition Care Management Follow-up Telephone Call Date of Discharge: 09/16/23 Discharge Facility: [facility] ([facility]) Type of Discharge: Emergency Department Reason for ED Visit: Other: (leg swelling and feet pain) How have you been since you were released from the hospital?: Same Any questions or concerns?: Yes Patient Questions/Concerns:: Pt C/O of pain in leg and feet when standing for long periods of time with ongoing swelling present. Patient Questions/Concerns Addressed: Provided Patient Educational Materials  Items Reviewed: Did you receive and understand the discharge instructions provided?: Yes Medications obtained,verified, and reconciled?: Yes (Medications Reviewed) Any new allergies since your discharge?: No Dietary orders reviewed?: No Do you have support at home?: No  Medications Reviewed Today: Medications Reviewed Today     Reviewed by Leroy Kennedy, CMA (Certified Medical Assistant) on 09/17/23 at 1440  Med List Status: <None>   Medication Order Taking? Sig Documenting Provider Last Dose Status Informant  amLODipine (NORVASC) 10 MG tablet [id number] Yes Take 1 tablet (10 mg total) by mouth daily. Veryl Speak, FNP Taking Active   ARIPiprazole (ABILIFY) 5 MG tablet [id number] Yes Take 1 tablet (5 mg total) by mouth daily. Garnette Gunner, MD Taking Active   bictegravir-emtricitabine-tenofovir AF (BIKTARVY) 50-200-25 MG TABS tablet [id number] Yes TAKE 1 TABLET BY MOUTH EVERY DAY Veryl Speak, FNP Taking Active   buPROPion (WELLBUTRIN XL) 150 MG 24 hr tablet [id number] Yes Take 1 tablet (150 mg total) by mouth daily. Park Pope, MD Taking Active   diclofenac Sodium (VOLTAREN) 1 % GEL [id number] Yes Apply 4 grams  topically 4 (four) times daily to affected joint. Rodolph Bong, MD Taking Active   escitalopram (LEXAPRO) 20 MG tablet [id number] Yes Take 1 tablet (20 mg total) by mouth daily. Worthy Rancher B, FNP Taking Active   esomeprazole (NEXIUM) 40 MG capsule [id number]  Take 1 capsule (40 mg total) by mouth daily before breakfast. Hilarie Fredrickson, MD  Expired 09/11/23 2359   esomeprazole (NEXIUM) 40 MG capsule [id number] Yes Take 1 capsule (40 mg total) by mouth 2 (two) times daily before a meal. Hilarie Fredrickson, MD Taking Active   famotidine (PEPCID) 40 MG tablet [id number] Yes Take 1 tablet (40 mg total) by mouth at bedtime. Garnette Gunner, MD Taking Active   finasteride (PROSCAR) 5 MG tablet [id number] Yes Take 1 tablet (5 mg total) by mouth daily. Garnette Gunner, MD Taking Active   furosemide (LASIX) 20 MG tablet [id number] No Take 1 tablet (20 mg total) by mouth daily for 3 days.  Patient not taking: Reported on 09/17/2023   Virgina Norfolk, DO Not Taking Active   polyethylene glycol powder (GLYCOLAX/MIRALAX) 17 GM/SCOOP powder [id number] Yes Dissolve 17 grams in liquid and drink by mouth every day. Garnette Gunner, MD Taking Active   propranolol (INDERAL) 10 MG tablet [id number] Yes Take 1 tablet (10 mg total) by mouth 2 (two) times daily as needed. Park Pope, MD Taking Active   rosuvastatin (CRESTOR) 10 MG tablet [id number] Yes Take 1 tablet (10 mg total) by mouth daily. Veryl Speak, FNP Taking Active   tadalafil (CIALIS) 10 MG tablet [id number] Yes Take 0.5 tablets (5 mg total) by mouth daily. Garnette Gunner, MD Taking Active  Home Care and Equipment/Supplies: Were Home Health Services Ordered?: No Any new equipment or medical supplies ordered?: No  Functional Questionnaire: Do you need assistance with bathing/showering or dressing?: No Do you need assistance with meal preparation?: No Do you need assistance with eating?: No Do you have difficulty maintaining continence:  No Do you need assistance with getting out of bed/getting out of a chair/moving?: No Do you have difficulty managing or taking your medications?: No  Follow up appointments reviewed: PCP Follow-up appointment confirmed?: Yes Date of PCP follow-up appointment?: 09/19/23 Follow-up Provider: Dr. [facility] Follow-up appointment confirmed?: NA Do you need transportation to your follow-up appointment?: No Do you understand care options if your condition(s) worsen?: Yes-patient verbalized understanding    SIGNATURE Jodelle Green, RMA

## 2023-09-18 ENCOUNTER — Other Ambulatory Visit (HOSPITAL_COMMUNITY): Payer: Self-pay

## 2023-09-18 ENCOUNTER — Encounter (HOSPITAL_COMMUNITY): Payer: Self-pay | Admitting: Student

## 2023-09-18 ENCOUNTER — Ambulatory Visit (HOSPITAL_BASED_OUTPATIENT_CLINIC_OR_DEPARTMENT_OTHER): Payer: MEDICAID | Admitting: Student

## 2023-09-18 VITALS — BP 160/96 | HR 88 | Wt 260.0 lb

## 2023-09-18 DIAGNOSIS — F332 Major depressive disorder, recurrent severe without psychotic features: Secondary | ICD-10-CM | POA: Diagnosis not present

## 2023-09-18 DIAGNOSIS — F330 Major depressive disorder, recurrent, mild: Secondary | ICD-10-CM | POA: Diagnosis not present

## 2023-09-18 DIAGNOSIS — E559 Vitamin D deficiency, unspecified: Secondary | ICD-10-CM

## 2023-09-18 MED ORDER — VITAMIN D (ERGOCALCIFEROL) 1.25 MG (50000 UNIT) PO CAPS
50000.0000 [IU] | ORAL_CAPSULE | ORAL | 0 refills | Status: DC
Start: 2023-09-18 — End: 2023-12-24
  Filled 2023-09-18: qty 8, 56d supply, fill #0

## 2023-09-18 MED ORDER — BUPROPION HCL ER (XL) 150 MG PO TB24
150.0000 mg | ORAL_TABLET | Freq: Every day | ORAL | 0 refills | Status: DC
  Filled 2023-09-18: qty 90, 90d supply, fill #0

## 2023-09-18 MED ORDER — ARIPIPRAZOLE 5 MG PO TABS
5.0000 mg | ORAL_TABLET | Freq: Every day | ORAL | 0 refills | Status: DC
  Filled 2023-09-18: qty 90, 90d supply, fill #0

## 2023-09-18 MED ORDER — ESCITALOPRAM OXALATE 20 MG PO TABS
20.0000 mg | ORAL_TABLET | Freq: Every day | ORAL | 0 refills | Status: DC
  Filled 2023-09-18: qty 90, 90d supply, fill #0

## 2023-09-19 ENCOUNTER — Ambulatory Visit (INDEPENDENT_AMBULATORY_CARE_PROVIDER_SITE_OTHER): Payer: MEDICAID | Admitting: Family Medicine

## 2023-09-19 ENCOUNTER — Other Ambulatory Visit: Payer: Self-pay | Admitting: Family Medicine

## 2023-09-19 ENCOUNTER — Other Ambulatory Visit (HOSPITAL_COMMUNITY): Payer: Self-pay

## 2023-09-19 ENCOUNTER — Other Ambulatory Visit: Payer: Self-pay

## 2023-09-19 VITALS — BP 134/80 | HR 79 | Temp 97.5°F | Wt 257.8 lb

## 2023-09-19 DIAGNOSIS — R351 Nocturia: Secondary | ICD-10-CM

## 2023-09-19 DIAGNOSIS — R600 Localized edema: Secondary | ICD-10-CM

## 2023-09-19 DIAGNOSIS — I10 Essential (primary) hypertension: Secondary | ICD-10-CM | POA: Diagnosis not present

## 2023-09-19 DIAGNOSIS — G5601 Carpal tunnel syndrome, right upper limb: Secondary | ICD-10-CM

## 2023-09-19 DIAGNOSIS — K219 Gastro-esophageal reflux disease without esophagitis: Secondary | ICD-10-CM

## 2023-09-19 DIAGNOSIS — K5909 Other constipation: Secondary | ICD-10-CM

## 2023-09-19 LAB — BRAIN NATRIURETIC PEPTIDE: Pro B Natriuretic peptide (BNP): 18 pg/mL (ref 0.0–100.0)

## 2023-09-19 LAB — MICROALBUMIN / CREATININE URINE RATIO
Creatinine,U: 49.5 mg/dL
Microalb Creat Ratio: UNDETERMINED mg/g (ref 0.0–30.0)
Microalb, Ur: <0.7 mg/dL

## 2023-09-19 MED ORDER — FUROSEMIDE 20 MG PO TABS
20.0000 mg | ORAL_TABLET | Freq: Every day | ORAL | 0 refills | Status: DC | PRN
  Filled 2023-09-19: qty 90, 90d supply, fill #0

## 2023-09-19 MED ORDER — MEDICAL COMPRESSION STOCKINGS MISC
1.0000 | Freq: Every day | 0 refills | Status: AC
  Filled 2023-09-19 – 2024-06-02 (×4): qty 1, fill #0

## 2023-09-19 MED ORDER — OLMESARTAN MEDOXOMIL-HCTZ 40-25 MG PO TABS
1.0000 | ORAL_TABLET | Freq: Every day | ORAL | 0 refills | Status: DC
Start: 2023-09-19 — End: 2023-09-19
  Filled 2023-09-19: qty 90, 90d supply, fill #0

## 2023-09-19 MED ORDER — TADALAFIL 10 MG PO TABS
5.0000 mg | ORAL_TABLET | Freq: Every day | ORAL | 0 refills | Status: DC
  Filled 2023-09-19 – 2023-11-04 (×2): qty 45, 90d supply, fill #0

## 2023-09-19 MED ORDER — POLYETHYLENE GLYCOL 3350 17 GM/SCOOP PO POWD
17.0000 g | Freq: Every day | ORAL | 0 refills | Status: DC
  Filled 2023-09-19: qty 476, 28d supply, fill #0

## 2023-09-19 MED ORDER — FAMOTIDINE 40 MG PO TABS
40.0000 mg | ORAL_TABLET | Freq: Every day | ORAL | 0 refills | Status: DC
  Filled 2023-09-19: qty 90, 90d supply, fill #0

## 2023-09-19 MED ORDER — FINASTERIDE 5 MG PO TABS
5.0000 mg | ORAL_TABLET | Freq: Every day | ORAL | 1 refills | Status: DC
  Filled 2023-09-19: qty 30, 30d supply, fill #0
  Filled 2023-11-30: qty 30, 30d supply, fill #1

## 2023-09-19 MED ORDER — CARPAL TUNNEL WRIST STABILIZER MISC
1.0000 | 0 refills | Status: AC | PRN
Start: 2023-09-19 — End: ?
  Filled 2023-09-19 (×3): qty 1, fill #0

## 2023-09-19 MED ORDER — OLMESARTAN MEDOXOMIL-HCTZ 40-25 MG PO TABS
1.0000 | ORAL_TABLET | Freq: Every day | ORAL | 0 refills | Status: DC
  Filled 2023-09-19: qty 90, 90d supply, fill #0

## 2023-09-19 NOTE — Patient Instructions (Addendum)
 VISIT SUMMARY:  Today, we discussed the swelling in your feet and ankles, discomfort in your right hand, and your blood pressure management. We reviewed your recent emergency room visit and lab results, and we have made some changes to your treatment plan to address these issues.  YOUR PLAN:  -LOWER EXTREMITY EDEMA: Lower extremity edema means swelling in the lower legs, which can be caused by various factors including medication side effects, poor blood flow, or heart issues. We will perform an ultrasound to check your veins, a blood test to rule out heart failure, and recommend reducing your salt intake to less than 2000 mg per day. You should also wear compression stockings and we will stop your current blood pressure medication, amlodipine, and start a new medication, olmesartan- hydrochlorothiazide. You may also try  furosemide 20 mg as needed for swelling.   -HYPERTENSION: Hypertension is high blood pressure, which can lead to other health problems if not managed well. Since your current medication, amlodipine, might be causing your leg swelling, we will stop it and start you on olmesartan-hydrochlorothiazide, which can help with both blood pressure and swelling.  -CARPAL TUNNEL SYNDROME: Carpal tunnel syndrome is a condition that causes pain and tingling in the hand and fingers due to pressure on a nerve in the wrist. We recommend using an over-the-counter wrist brace and will refer you to an orthopedic specialist for further evaluation.  INSTRUCTIONS:  Please schedule a follow-up appointment in two weeks to reassess your blood pressure, leg swelling, and repeat lab work. Make sure to get the ultrasound and blood test done as soon as possible. Start wearing compression stockings and reduce your dietary sodium intake to less than 2000 mg per day. Use an over-the-counter wrist brace for your hand discomfort and await further evaluation from orthopedics.

## 2023-09-19 NOTE — Addendum Note (Signed)
 Addended by: Everlena Cooper on: 09/19/2023 09:27 AM   Modules accepted: Level of Service

## 2023-09-19 NOTE — Progress Notes (Signed)
 Assessment/Plan:      Lower extremity edema Chronic swelling in feet and ankles since November, worsening over the past three months. No significant pain, but some tingling. Recent ER visit with normal lab work, including liver and kidney function. Possible causes include medication side effects (amlodipine), venous stasis, or heart failure. No chest pain or dyspnea reported. - Order ultrasound of lower extremities to assess venous flow - Order BNP blood test to evaluate for heart failure - Check UA for nephrotic syndrome -Rule out thyroid dysfunction - Reduce dietary sodium intake to less than 2000 mg - Recommend compression stockings -May also trial furosemide 20 mg daily as needed -Recommend elevation of lower extremities - Hypertension Blood pressure slightly elevated. Current medication includes amlodipine, which may contribute to lower extremity edema. Plan to adjust medication regimen to address both hypertension and edema. Discussed potential benefits of combination therapy with hydrochlorothiazide. - Discontinue amlodipine and transition to olmesartan-hydrochlorothiazide 40 mg - 25 mg daily  Carpal tunnel syndrome Right hand discomfort with tingling and pain, particularly in the thumb, index, and middle fingers. Positive Tinel's sign suggests carpal tunnel syndrome. Symptoms worsen with hand use. - Recommend carpal tunnel brace - Refer to orthopedics for further evaluation of right-sided carpal tunnel syndrome  Follow-up Monitor response to medication changes and evaluate lab results. - Schedule follow-up appointment in two weeks to reassess blood pressure, leg swelling, and repeat lab work        Medications Discontinued During This Encounter  Medication Reason   amLODipine (NORVASC) 10 MG tablet    furosemide (LASIX) 20 MG tablet Reorder    Return in about 2 weeks (around 10/03/2023) for BP.    Subjective:   Encounter date: 09/19/2023  Phillip Gill is a 48  y.o. male who has Anxiety; Other constipation; Depression; Fibromyalgia muscle pain; Anal condyloma; Gynecomastia; HTN (hypertension); Chronic hepatitis B (HCC); LGV (lymphogranuloma venereum); Primary syphilis; Colitis; HIV (human immunodeficiency virus infection) (HCC); Tobacco abuse; Overweight; Chronic pain associated with significant psychosocial dysfunction; Gonorrhea; Human immunodeficiency virus (HIV) disease (HCC); Foot lesion; Hepatitis B surface antigen positive; Healthcare maintenance; Erectile dysfunction; Cervical pain; Human monkeypox; Encounter for physical examination related to employment; Snoring; Urinary frequency; Left foot pain; Poor social situation; MDD (major depressive disorder); Passive suicidal ideations; OSA on CPAP; MDD (major depressive disorder), recurrent severe, without psychosis (HCC); Gastroesophageal reflux disease; Neck mass; and Nocturia on their problem list..   He  has a past medical history of Anxiety, Arthritis, Chronic hepatitis B (HCC), Chronic lower back pain, Depression, Fibromyalgia, Genital warts, HIV disease (HCC) (02/28/2015), HIV infection (HCC), Hypertension, IBS (irritable bowel syndrome), Migraine, Overweight (07/20/2015), and Renal insufficiency (12/29/2013).Marland Kitchen   He presents with chief complaint of Edema (Leg and feet swelling x Nov. Follow up from ED.  Right hand pain. Hurts to close. ) .   Discussed the use of AI scribe software for clinical note transcription with the patient, who gave verbal consent to proceed.  History of Present Illness   Phillip Gill "Phillip Gill" is a 48 year old male who presents with swelling in the feet and ankles.  He has been experiencing swelling in his feet and ankles since November, with worsening symptoms over the past three months. The swelling is occasionally accompanied by pain at the bottom of the feet and sometimes in the ankle or lower calf area, though the pain is not severe. He also experiences tingling in the  affected areas. No chest pain, shortness of breath, or urinary problems, although  he mentions frequent urination.  In addition to the swelling, he reports discomfort in his right hand, describing a tingling sensation and pain when attempting to make a fist. The pain is localized to the hand, particularly affecting the thumb, pointer, and middle fingers.  He was seen at the emergency room a few days ago, where basic lab work was performed, showing no anemia, liver dysfunction, or kidney dysfunction. He was prescribed furosemide 25 mg daily as needed for swelling, but he has not yet received the medication as it is being mailed to him. He has tried elevating his legs, which provided minimal relief. He is currently taking amlodipine for blood pressure management.        Review of Systems  Constitutional:  Negative for chills, diaphoresis, fever and malaise/fatigue.  HENT:  Negative for congestion, ear discharge, ear pain and hearing loss.   Eyes:  Negative for blurred vision, double vision, photophobia, pain, discharge and redness.  Respiratory:  Negative for cough, sputum production, shortness of breath and wheezing.   Cardiovascular:  Positive for leg swelling. Negative for chest pain, palpitations, orthopnea, claudication and PND.  Gastrointestinal:  Negative for abdominal pain, blood in stool, constipation, diarrhea, heartburn, melena, nausea and vomiting.  Genitourinary:  Negative for dysuria, flank pain, frequency, hematuria and urgency.  Musculoskeletal:  Negative for myalgias.  Skin:  Negative for itching and rash.  Neurological:  Positive for tingling. Negative for dizziness, tremors, speech change, seizures, loss of consciousness, weakness and headaches.  Endo/Heme/Allergies:  Negative for polydipsia.  All other systems reviewed and are negative.   Past Surgical History:  Procedure Laterality Date   CO2 LASER APPLICATION N/A 02/11/2013   Procedure: CO2 LASER APPLICATION;  Surgeon:  Romie Levee, MD;  Location: [facility];  Service: General;  Laterality: N/A;   FOOT SURGERY     HIGH RESOLUTION ANOSCOPY N/A 02/11/2013   Procedure: HIGH RESOLUTION ANOSCOPY WITH BIOPSY, LASER ABLATION;  Surgeon: Romie Levee, MD;  Location: [facility];  Service: General;  Laterality: N/A;   WISDOM TOOTH EXTRACTION      Outpatient Medications Prior to Visit  Medication Sig Dispense Refill   ARIPiprazole (ABILIFY) 5 MG tablet Take 1 tablet (5 mg total) by mouth daily. 90 tablet 0   bictegravir-emtricitabine-tenofovir AF (BIKTARVY) 50-200-25 MG TABS tablet TAKE 1 TABLET BY MOUTH EVERY DAY 30 tablet 5   buPROPion (WELLBUTRIN XL) 150 MG 24 hr tablet Take 1 tablet (150 mg total) by mouth daily. 90 tablet 0   diclofenac Sodium (VOLTAREN) 1 % GEL Apply 4 grams topically 4 (four) times daily to affected joint. 100 g 11   escitalopram (LEXAPRO) 20 MG tablet Take 1 tablet (20 mg total) by mouth daily. 90 tablet 0   esomeprazole (NEXIUM) 40 MG capsule Take 1 capsule (40 mg total) by mouth 2 (two) times daily before a meal. 180 capsule 3   famotidine (PEPCID) 40 MG tablet Take 1 tablet (40 mg total) by mouth at bedtime. 90 tablet 0   finasteride (PROSCAR) 5 MG tablet Take 1 tablet (5 mg total) by mouth daily. 30 tablet 1   polyethylene glycol powder (GLYCOLAX/MIRALAX) 17 GM/SCOOP powder Dissolve 17 grams in liquid and drink by mouth every day. 476 g 0   rosuvastatin (CRESTOR) 10 MG tablet Take 1 tablet (10 mg total) by mouth daily. 30 tablet 2   tadalafil (CIALIS) 10 MG tablet Take 0.5 tablets (5 mg total) by mouth daily. 45 tablet 0   Vitamin D, Ergocalciferol, (  DRISDOL) 1.25 MG (50000 UNIT) CAPS capsule Take 1 capsule (50,000 Units total) by mouth every 7 (seven) days. 8 capsule 0   amLODipine (NORVASC) 10 MG tablet Take 1 tablet (10 mg total) by mouth daily. 30 tablet 0   esomeprazole (NEXIUM) 40 MG capsule Take 1 capsule (40 mg total) by mouth daily before breakfast.  90 capsule 0   furosemide (LASIX) 20 MG tablet Take 1 tablet (20 mg total) by mouth daily for 3 days. (Patient not taking: Reported on 09/19/2023) 3 tablet 0   No facility-administered medications prior to visit.    Family History  Problem Relation Age of Onset   Hypertension Mother    Diabetes Mother    Stroke Mother        cerbral aneurysm   Pancreatic cancer Mother    Colon cancer Father    Hypertension Brother    Sleep apnea Cousin    Mental illness Neg Hx    Stomach cancer Neg Hx    Esophageal cancer Neg Hx     Social History   Socioeconomic History   Marital status: Single    Spouse name: Not on file   Number of children: Not on file   Years of education: Not on file   Highest education level: Not on file  Occupational History   Not on file  Tobacco Use   Smoking status: Former    Current packs/day: 0.00    Average packs/day: 1 pack/day for 18.0 years (18.0 ttl pk-yrs)    Types: Cigarettes    Start date: 07/03/2003    Quit date: 07/02/2021    Years since quitting: 2.2    Passive exposure: Never   Smokeless tobacco: Never   Tobacco comments:    Pt vapes everyday  Vaping Use   Vaping status: Former   Quit date: 12/01/2022   Substances: Nicotine  Substance and Sexual Activity   Alcohol use: Yes    Comment: occasional use   Drug use: No   Sexual activity: Not on file    Comment: accepted condoms  Other Topics Concern   Not on file  Social History Narrative   Grew up in [place], now living in [place],-  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    Finished HS.   Doing online classes with [employer]- studying accounting   Previously 2 years of classes at [employer].    Has Partner- [name]- together for 1 year.    Has 2 girls- (born in 2000).          Social Drivers of Corporate investment banker Strain: High Risk (02/05/2023)   Overall Financial Resource Strain (CARDIA)    Difficulty of Paying Living Expenses: Very hard  Food Insecurity:  Food Insecurity Present (02/05/2023)   Hunger Vital Sign    Worried About Running Out of Food in the Last Year: Sometimes true    Ran Out of Food in the Last Year: Sometimes true  Transportation Needs: Unmet Transportation Needs (02/05/2023)   PRAPARE - Administrator, Civil Service (Medical): Yes    Lack of Transportation (Non-Medical): Yes  Physical Activity: Sufficiently Active (07/16/2018)   Received from [facility], [facility]   Exercise Vital Sign    Days of Exercise per Week: 4 days    Minutes of Exercise per Session: 150+ min  Stress: Stress Concern Present (03/01/2023)   [employer] of Occupational Health - Occupational Stress Questionnaire    Feeling  of Stress : To some extent  Social Connections: Unknown (05/23/2022)   Received from [facility], [facility]   Social Network    Social Network: Not on file  Intimate Partner Violence: Unknown (05/23/2022)   Received from [facility], [facility]   HITS    Physically Hurt: Not on file    Insult or Talk Down To: Not on file    Threaten Physical Harm: Not on file    Scream or Curse: Not on file                                                                                                  Objective:  Physical Exam: BP (!) 148/86   Pulse 79   Temp (!) 97.5 F (36.4 C) (Temporal)   Wt 257 lb 12.8 oz (116.9 kg)   SpO2 99%   BMI 34.96 kg/m     Physical Exam Constitutional:      Appearance: Normal appearance.  HENT:     Head: Normocephalic and atraumatic.     Right Ear: Hearing normal.     Left Ear: Hearing normal.     Nose: Nose normal.  Eyes:     General: No scleral icterus.       Right eye: No discharge.        Left eye: No discharge.     Extraocular Movements: Extraocular movements intact.  Cardiovascular:     Rate and Rhythm: Normal rate and regular rhythm.     Heart sounds: Normal heart sounds.  Pulmonary:     Effort: Pulmonary effort is  normal.     Breath sounds: Normal breath sounds.  Abdominal:     Palpations: Abdomen is soft.     Tenderness: There is no abdominal tenderness.  Musculoskeletal:     Right hand: No swelling, deformity or tenderness. Decreased range of motion. Decreased sensation of the median distribution.     Right lower leg: 2+ Pitting Edema present.     Left lower leg: 2+ Pitting Edema present.  Skin:    General: Skin is warm.     Findings: No rash.  Neurological:     General: No focal deficit present.     Mental Status: He is alert.     Cranial Nerves: No cranial nerve deficit.  Psychiatric:        Mood and Affect: Mood normal.        Behavior: Behavior normal.        Thought Content: Thought content normal.        Judgment: Judgment normal.     DG INJECT DIAG/THERA/INC NEEDLE/CATH/PLC EPI/LUMB/SAC W/IMG Result Date: 07/12/2023 CLINICAL DATA:  Lumbosacral spondylosis without myelopathy. 90 % symptomatic relief after previous injection, without side effect or complication. Partial recurrence of symptoms. The patient wishes to repeat. EXAM: LUMBAR EPIDURAL INJECTION: DIAGNOSTIC EPIDURAL INJECTION: THERAPEUTIC EPIDURAL INJECTION: PROCEDURE: The procedure, risks, benefits, and alternatives were explained to the patient. Questions regarding the procedure were encouraged and answered. The patient understands and consents to the procedure. An interlaminar approach was performed on left at L5-S1. The overlying skin  was cleansed and anesthetized. A 20 gauge epidural needle was advanced using loss-of-resistance technique. Injection of Isovue-M 200 shows a good epidural pattern with spread above and below the level of needle placement, primarily on the left. No vascular opacification is seen. 80mg  of Depo-Medrol mixed with 2ml lidocaine 1% were instilled. The procedure was well-tolerated, and the patient was discharged thirty minutes following the injection in good condition. FLUOROSCOPY: Radiation Exposure Index  (as provided by the fluoroscopic device): 3.4 mGy air Kerma COMPLICATIONS: None immediate IMPRESSION: Technically successful epidural injection on the left at L5-S1. Electronically Signed   By: Corlis Leak M.D.   On: 07/12/2023 10:46    Recent Results (from the past 2160 hours)  Urine cytology ancillary only     Status: None   Collection Time: 08/01/23 10:02 AM  Result Value Ref Range   Neisseria Gonorrhea Negative    Chlamydia Negative    Comment Normal Reference Ranger Chlamydia - Negative    Comment      Normal Reference Range Neisseria Gonorrhea - Negative  T-helper cells (CD4) count (not at [facility])     Status: None   Collection Time: 08/01/23 10:05 AM  Result Value Ref Range   CD4 T Helper % 35 30 - 61 %   Absolute CD4 1,023 490 - 1,740 cells/uL   Total lymphocyte count 2,965 850 - 3,900 cells/uL    Comment: The analytical performance characteristics of this assay  have been determined by [facility]. The  modifications have not been cleared or approved by the  FDA. This assay has been validated pursuant to the CLIA  regulations and is used for clinical purposes.   Hepatitis B surface antibody,quantitative     Status: Abnormal   Collection Time: 08/01/23 10:05 AM  Result Value Ref Range   Hep B S AB Quant (Post) 6 (L) > OR = 10 mIU/mL    Comment: . Patient does not have immunity to hepatitis B virus. . For additional information, please refer to http://education.[facility].com/faq/FAQ105 (This link is being provided for informational/ educational purposes only).   Hepatitis B surface antigen     Status: None   Collection Time: 08/01/23 10:05 AM  Result Value Ref Range   Hepatitis B Surface Ag NON-REACTIVE NON-REACTIVE    Comment: . For additional information, please refer to  http://education.[facility].com/faq/FAQ202  (This link is being provided for informational/ educational purposes only.) .   Hepatitis B DNA, ultraquantitative, PCR     Status:  None   Collection Time: 08/01/23 10:05 AM  Result Value Ref Range   Hepatitis B DNA Not Detected IU/mL   Hepatitis B virus DNA Not Detected Log IU/mL    Comment: . Reference Range:                          Not Detected       IU/mL                          Not Detected   Log IU/mL . The analytical performance characteristics of this assay have been determined by [facility]. The modifications have not been cleared or approved by the U.S. Food and Drug Administration. This assay has been validated pursuant to the CLIA regulations and is used for clinical purposes. .   RPR     Status: None   Collection Time: 08/01/23 10:05 AM  Result Value Ref Range   RPR Ser Ql  NON-REACTIVE NON-REACTIVE    Comment: . No laboratory evidence of syphilis. If recent exposure is suspected, submit a new sample in 2-4 weeks. .   Lipid panel     Status: Abnormal   Collection Time: 08/01/23 10:05 AM  Result Value Ref Range   Cholesterol 199 <200 mg/dL   HDL 42 > OR = 40 mg/dL   Triglycerides 578 (H) <150 mg/dL   LDL Cholesterol (Calc) 130 (H) mg/dL (calc)    Comment: Reference range: <100 . Desirable range <100 mg/dL for primary prevention;   <70 mg/dL for patients with CHD or diabetic patients  with > or = 2 CHD risk factors. Marland Kitchen LDL-C is now calculated using the Martin-Hopkins  calculation, which is a validated novel method providing  better accuracy than the Friedewald equation in the  estimation of LDL-C.  Horald Pollen et al. Lenox Ahr. 4696;295(28): 2061-2068  (http://education.[facility].com/faq/FAQ164)    Total CHOL/HDL Ratio 4.7 <5.0 (calc)   Non-HDL Cholesterol (Calc) 157 (H) <130 mg/dL (calc)    Comment: For patients with diabetes plus 1 major ASCVD risk  factor, treating to a non-HDL-C goal of <100 mg/dL  (LDL-C of <41 mg/dL) is considered a therapeutic  option.   CBC with Differential     Status: Abnormal   Collection Time: 09/16/23  8:09 AM  Result Value Ref Range   WBC  10.6 (H) 4.0 - 10.5 K/uL   RBC 4.79 4.22 - 5.81 MIL/uL   Hemoglobin 13.9 13.0 - 17.0 g/dL   HCT 32.4 40.1 - 02.7 %   MCV 89.4 80.0 - 100.0 fL   MCH 29.0 26.0 - 34.0 pg   MCHC 32.5 30.0 - 36.0 g/dL   RDW 25.3 66.4 - 40.3 %   Platelets 254 150 - 400 K/uL   nRBC 0.0 0.0 - 0.2 %   Neutrophils Relative % 55 %   Neutro Abs 5.9 1.7 - 7.7 K/uL   Lymphocytes Relative 34 %   Lymphs Abs 3.6 0.7 - 4.0 K/uL   Monocytes Relative 9 %   Monocytes Absolute 1.0 0.1 - 1.0 K/uL   Eosinophils Relative 1 %   Eosinophils Absolute 0.1 0.0 - 0.5 K/uL   Basophils Relative 0 %   Basophils Absolute 0.0 0.0 - 0.1 K/uL   Immature Granulocytes 1 %   Abs Immature Granulocytes 0.05 0.00 - 0.07 K/uL    Comment: Performed at [facility], [address], [place], [place] [address]  Comprehensive metabolic panel     Status: Abnormal   Collection Time: 09/16/23  8:09 AM  Result Value Ref Range   Sodium 138 135 - 145 mmol/L   Potassium 3.8 3.5 - 5.1 mmol/L   Chloride 104 98 - 111 mmol/L   CO2 30 22 - 32 mmol/L   Glucose, Bld 131 (H) 70 - 99 mg/dL    Comment: Glucose reference range applies only to samples taken after fasting for at least 8 hours.   BUN 10 6 - 20 mg/dL   Creatinine, Ser 9.56 0.61 - 1.24 mg/dL   Calcium 9.4 8.9 - 38.7 mg/dL   Total Protein 8.2 (H) 6.5 - 8.1 g/dL   Albumin 4.5 3.5 - 5.0 g/dL   AST 21 15 - 41 U/L   ALT 19 0 - 44 U/L   Alkaline Phosphatase 84 38 - 126 U/L   Total Bilirubin 0.5 0.0 - 1.2 mg/dL   GFR, Estimated >56 >43 mL/min    Comment: (NOTE) Calculated using the CKD-EPI Creatinine Equation (2021)  Anion gap 4 (L) 5 - 15    Comment: Performed at [facility], [address], [place], [place] [address]        Garner Nash, MD, MS

## 2023-09-20 ENCOUNTER — Other Ambulatory Visit (HOSPITAL_COMMUNITY): Payer: Self-pay

## 2023-09-20 ENCOUNTER — Other Ambulatory Visit: Payer: Self-pay

## 2023-09-20 LAB — THYROID PANEL WITH TSH
Free Thyroxine Index: 1.6 (ref 1.4–3.8)
T3 Uptake: 27 % (ref 22–35)
T4, Total: 6 ug/dL (ref 4.9–10.5)
TSH: 1.39 m[IU]/L (ref 0.40–4.50)

## 2023-09-20 LAB — NO CULTURE INDICATED

## 2023-09-20 LAB — URINALYSIS W MICROSCOPIC + REFLEX CULTURE
Bacteria, UA: NONE SEEN /HPF
Bilirubin Urine: NEGATIVE
Glucose, UA: NEGATIVE
Hgb urine dipstick: NEGATIVE
Hyaline Cast: NONE SEEN /LPF
Ketones, ur: NEGATIVE
Leukocyte Esterase: NEGATIVE
Nitrites, Initial: NEGATIVE
Protein, ur: NEGATIVE
RBC / HPF: NONE SEEN /HPF (ref 0–2)
Specific Gravity, Urine: 1.008 (ref 1.001–1.035)
Squamous Epithelial / HPF: NONE SEEN /HPF (ref <=5)
WBC, UA: NONE SEEN /HPF (ref 0–5)
pH: 5.5 (ref 5.0–8.0)

## 2023-09-23 ENCOUNTER — Encounter: Payer: Self-pay | Admitting: Family Medicine

## 2023-09-23 ENCOUNTER — Other Ambulatory Visit: Payer: Self-pay

## 2023-09-24 ENCOUNTER — Ambulatory Visit (INDEPENDENT_AMBULATORY_CARE_PROVIDER_SITE_OTHER): Payer: MEDICAID | Admitting: Licensed Clinical Social Worker

## 2023-09-24 DIAGNOSIS — F411 Generalized anxiety disorder: Secondary | ICD-10-CM

## 2023-09-24 DIAGNOSIS — F332 Major depressive disorder, recurrent severe without psychotic features: Secondary | ICD-10-CM | POA: Diagnosis not present

## 2023-09-24 NOTE — Progress Notes (Signed)
 THERAPIST PROGRESS NOTE   Session Date: 09/24/2023  Session Time: 1017 - 1105  Participation Level: Active  Behavioral Response: CasualAlertDysphoric  Type of Therapy: Individual Therapy  Treatment Goals addressed:  - LTG: Reduce frequency, intensity, and duration of depression symptoms so that daily functioning is improved (OP Depression) - LTG: Increase coping skills to manage depression and improve ability to perform daily activities (OP Depression) - LTG: "To not be depressed, have more energy, and stop over eating" (OP Depression) - STG: Report a decrease in anxiety symptoms as evidenced by an overall reduction in anxiety score by a minimum of 25% on the Generalized Anxiety Disorder Scale (GAD-7) (Anxiety) - STG: Mostyn "Shan" will reduce frequency of avoidant behaviors by 50% as evidenced by self-report in therapy sessions (Anxiety) - LTG: "Be a more people person" Increase frequency at which pt is actively engaging in social activities. (Anxiety)  ProgressTowards Goals: Not Progressing  Interventions: CBT, Solution Focused, and Supportive  Summary: Phillip Gill is a 48 y.o. male with past psych history of MDD and GAD, presenting for follow up therapy session in efforts to improve management of depressive and anxious symptoms. Patient actively engaged in session, presenting in dysphoric moods, with congruent affect throughout.   -Patient openly engaged in introductory check-in, sharing briefly of being tired due to having worked last night.  Patient willingly engaged in reassessing of presenting depressive and anxious symptoms via PHQ-9 and GAD-7, further processing noted/observed symptoms, sharing of experiencing passive SI, detailing of having thoughts that "wouldn't be in anyone's way", denying any plan or intent or means.  In further exploration patient shared thoughts being triggered primarily by current living arrangements with 2 elderly aunts and additional stressors related to the  household.  Patient further expounded on noted stressors, sharing of the younger of his 2 aunts continuing to get on his nerves, providing specific examples of how aunt proves to be inconsiderate of others in the home and patient's individual needs.  Patient detailed of challenges with frustration tolerance noting of attempts to ignore and overlook aunts behaviors, proving difficult to point which pt will "snap", noting of once pt reaches that point, it proves difficult for him to calm down. Processed possible solutions, expressing feeling the need to get out of the house and secure alternate living arrangements. 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 - Additionally shared stress surrounding paternal uncles passing two weeks ago, with uncle being the last living of 9 boys, reporting of experiencing/observed increased anxiousness surrounding whether health issues will be passed on, whether pt's own life will be short or long, who will care for , him if needed, and will someone care for pt efficiently. Began exploring hx of relationship w/ daughters, expressing feelings of having distant relationship throughout their childhood due to their mother as she kept finding excuses to keep daughters from him, processing shared feelings of having felt like a deadbeat, being hurt, angered, and still experiencing so of the same feelings.  Patient responded well to interventions. Patient continues to meet criteria for MDD and GAD. Patient will continue to benefit from engagement in outpatient therapy due to being the least restrictive service to meet presenting needs.      09/24/2023   10:19 AM 09/09/2023   10:13 AM 08/08/2023    4:12 PM 07/11/2023    2:48 PM  GAD 7 : Generalized Anxiety Score  Nervous, Anxious, on Edge  3 3 2 3   Control/stop worrying 3 3 2 3   Worry too much - different things 0 3 3 3   Trouble relaxing 3 1 2 3   Restless 1 1 2 3   Easily annoyed or irritable 3 3 3 3   Afraid - awful might happen 1 3 3 3   Total GAD 7 Score 14 17 17 21   Anxiety Difficulty Extremely difficult Somewhat difficult Somewhat difficult Extremely difficult      09/24/2023   10:23 AM 09/09/2023   10:16 AM 08/08/2023    4:23 PM 08/01/2023    9:36 AM 07/11/2023    2:49 PM  Depression screen PHQ 2/9  Decreased Interest 3 3 3 1 3   Down, Depressed, Hopeless 3 3 3 1 3   PHQ - 2 Score 6 6 6 2 6   Altered sleeping 3 3 3 1 3   Tired, decreased energy 3 3 3 1 3   Change in appetite 3 1 3 1 3   Feeling bad or failure about yourself  3 3 3 1 3   Trouble concentrating 3 3 3 1 3   Moving slowly or fidgety/restless 3 3 3 1 3   Suicidal thoughts 1 1 0 0 1  PHQ-9 Score 25 23 24 8 25   Difficult doing work/chores Extremely dIfficult Extremely dIfficult Somewhat difficult Extremely dIfficult Extremely dIfficult   Flowsheet Row ED from 09/16/2023 in [facility] Counselor from 06/20/2023 in [facility] [place] Counselor from 05/29/2023 in [facility]  C-SSRS RISK CATEGORY No Risk Low Risk Error: Q3, 4, or 5 should not be populated when Q2 is No       Suicidal/Homicidal: Yes. pSI, "feeling wouldn't be in anyone's way if I wasn't here". Pt detailed thoughts have been related to living situation with aunts.  Therapist Response: Clinician utilized CBT, solution focused, and supportive reflection interventions to support pt in navigation presenting sxs.   - Actively engaged pt in introductory check-in, assessing presenting moods and affect, prompting brief detail of presenting moods, stressors, and events. - Utilized open ended questions to support pt in reflections of hx of stressors, validating expressed thoughts and feelings. - Utilized  socratic questioning to aid pt in further navigation of thoughts and greater exploration of identified thoughts, challenging negative thoughts and exploring alternate perspectives. - Utilized solution focused methods to support pt in processing presenting stressors and exploring potential means of navigating and/or resolving stress.  Clinician reassessed presentation of sxs and presence of safety concerns. Clinician provided support and empathy to patient during session.  Plan: Return again in 2 weeks.  Diagnosis:  Encounter Diagnoses  Name Primary?   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  Patient/Guardian was advised Release of Information must be obtained prior to any record release in order to collaborate their care with an outside provider. Patient/Guardian was advised if they have not already done so to contact the registration department to sign all necessary forms in order for [place] to release information regarding their care.   Consent: Patient/Guardian gives verbal consent for treatment and assignment of benefits for services provided during this visit. Patient/Guardian expressed understanding and agreed to proceed.   Leisa Lenz, MSW, LCSW 09/24/2023,  10:54 AM

## 2023-09-25 ENCOUNTER — Ambulatory Visit (HOSPITAL_COMMUNITY): Payer: MEDICAID | Admitting: Student

## 2023-09-27 ENCOUNTER — Other Ambulatory Visit: Payer: Self-pay

## 2023-09-30 ENCOUNTER — Other Ambulatory Visit: Payer: Self-pay

## 2023-10-01 ENCOUNTER — Other Ambulatory Visit: Payer: Self-pay

## 2023-10-01 ENCOUNTER — Other Ambulatory Visit (HOSPITAL_COMMUNITY): Payer: Self-pay

## 2023-10-01 ENCOUNTER — Encounter: Payer: Self-pay | Admitting: Orthopaedic Surgery

## 2023-10-01 ENCOUNTER — Ambulatory Visit (INDEPENDENT_AMBULATORY_CARE_PROVIDER_SITE_OTHER): Payer: MEDICAID | Admitting: Orthopaedic Surgery

## 2023-10-01 DIAGNOSIS — G5602 Carpal tunnel syndrome, left upper limb: Secondary | ICD-10-CM | POA: Diagnosis not present

## 2023-10-01 DIAGNOSIS — G5601 Carpal tunnel syndrome, right upper limb: Secondary | ICD-10-CM | POA: Diagnosis not present

## 2023-10-01 MED ORDER — METHYLPREDNISOLONE 4 MG PO TBPK
ORAL_TABLET | ORAL | 0 refills | Status: DC
  Filled 2023-10-01: qty 21, 6d supply, fill #0

## 2023-10-01 NOTE — Progress Notes (Signed)
 Specialty Pharmacy Refill Coordination Note  OAKLAN PERSONS is a 48 y.o. male contacted today regarding refills of specialty medication(s) Biktarvy.  Patient requested (Patient-Rptd) Delivery   Delivery date: (Patient-Rptd) 10/02/23   Verified address: (Patient-Rptd) [address] [place], [id number]   Medication will be filled on 10/01/23.

## 2023-10-01 NOTE — Progress Notes (Signed)
 Office Visit Note   Patient: Phillip Gill           Date of Birth: 28-May-1976           MRN: [id number] Visit Date: 10/01/2023              Requested by: Garnette Gunner, MD [address] [place],  [place] [address] PCP: Garnette Gunner, MD   Assessment & Plan: Visit Diagnoses:  1. Left carpal tunnel syndrome   2. Right carpal tunnel syndrome     Plan: Patient is a 48 year old gentleman with clinical bilateral carpal tunnel syndrome.  Treatment options were discussed and we will start with nighttime splint and a Dosepak.  He will message me if symptoms do not improve.  Will order EMGs at that point.  Follow-Up Instructions: No follow-ups on file.   Orders:  No orders of the defined types were placed in this encounter.  Meds ordered this encounter  Medications   methylPREDNISolone (MEDROL DOSEPAK) 4 MG TBPK tablet    Sig: Take as directed    Dispense:  21 tablet    Refill:  0      Procedures: No procedures performed   Clinical Data: No additional findings.   Subjective: Chief Complaint  Patient presents with   Left Hand - Pain    HPI Patient is a 48 year old gentleman here for evaluation of bilateral hand numbness and tingling worse in the left hand.  Has difficulty making a fist.  Right-hand-dominant.  Works as a Lawyer. Review of Systems   Objective: Vital Signs: There were no vitals taken for this visit.  Physical Exam  Ortho Exam Examination of bilateral hands show no carpal tunnel compressive signs.  No muscle atrophy.  He can make a full composite fist. Specialty Comments:  No specialty comments available.  Imaging: No results found.   PMFS History: Patient Active Problem List   Diagnosis Date Noted   Nocturia 04/30/2023   Neck mass 04/01/2023   MDD (major depressive disorder), recurrent severe, without psychosis (HCC) 03/05/2023   Gastroesophageal reflux disease 03/05/2023   Passive suicidal ideations 02/05/2023   OSA  on CPAP 02/05/2023   MDD (major depressive disorder) 12/19/2022   Urinary frequency 12/18/2022   Left foot pain 12/18/2022   Poor social situation 12/18/2022   Snoring 01/22/2022   Encounter for physical examination related to employment 09/26/2021   Human monkeypox 04/06/2021   Cervical pain 02/07/2021   Erectile dysfunction 08/29/2020   Healthcare maintenance 04/05/2020   Foot lesion 12/24/2019   Human immunodeficiency virus (HIV) disease (HCC) 08/25/2019   Gonorrhea 02/11/2019   Hepatitis B surface antigen positive 09/05/2018   Overweight 07/20/2015   Colitis 03/08/2015   HIV (human immunodeficiency virus infection) (HCC)    Tobacco abuse    LGV (lymphogranuloma venereum)    Primary syphilis    Chronic hepatitis B (HCC) 10/03/2014   HTN (hypertension)    Gynecomastia 04/30/2014   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 Luciana Axe- sees him every 4 months   Hypertension    IBS (irritable bowel syndrome)    Migraine    "none in years" (02/16/2015   Overweight 07/20/2015   Renal insufficiency 12/29/2013    Family History  Problem Relation Age of Onset   Hypertension Mother    Diabetes Mother    Stroke Mother        cerbral aneurysm   Pancreatic cancer Mother    Colon cancer Father    Hypertension Brother    Sleep apnea Cousin    Mental illness Neg Hx    Stomach cancer Neg Hx    Esophageal cancer Neg Hx     Past Surgical History:  Procedure Laterality Date   CO2 LASER APPLICATION N/A 02/11/2013   Procedure:  CO2 LASER APPLICATION;  Surgeon: Romie Levee, MD;  Location: [facility];  Service: General;  Laterality: N/A;   FOOT SURGERY     HIGH RESOLUTION ANOSCOPY N/A 02/11/2013   Procedure: HIGH RESOLUTION ANOSCOPY WITH BIOPSY, LASER ABLATION;  Surgeon: Romie Levee, MD;  Location: [facility];  Service: General;  Laterality: N/A;   WISDOM TOOTH EXTRACTION     Social History   Occupational History   Not on file  Tobacco Use   Smoking status: Former    Current packs/day: 0.00    Average packs/day: 1 pack/day for 18.0 years (18.0 ttl pk-yrs)    Types: Cigarettes    Start date: 07/03/2003    Quit date: 07/02/2021    Years since quitting: 2.2    Passive exposure: Never   Smokeless tobacco: Never   Tobacco comments:    Pt vapes everyday  Vaping Use   Vaping status: Former   Quit date: 12/01/2022   Substances: Nicotine  Substance and Sexual Activity   Alcohol use: Yes    Comment: occasional use   Drug use: No   Sexual activity: Not on file    Comment: accepted condoms

## 2023-10-03 ENCOUNTER — Other Ambulatory Visit (HOSPITAL_COMMUNITY): Payer: Self-pay

## 2023-10-03 ENCOUNTER — Ambulatory Visit (INDEPENDENT_AMBULATORY_CARE_PROVIDER_SITE_OTHER): Payer: MEDICAID | Admitting: Family Medicine

## 2023-10-03 ENCOUNTER — Encounter: Payer: Self-pay | Admitting: Family Medicine

## 2023-10-03 VITALS — BP 138/85 | HR 90 | Temp 97.3°F | Resp 18 | Wt 253.0 lb

## 2023-10-03 DIAGNOSIS — F332 Major depressive disorder, recurrent severe without psychotic features: Secondary | ICD-10-CM | POA: Diagnosis not present

## 2023-10-03 DIAGNOSIS — G5603 Carpal tunnel syndrome, bilateral upper limbs: Secondary | ICD-10-CM

## 2023-10-03 DIAGNOSIS — I872 Venous insufficiency (chronic) (peripheral): Secondary | ICD-10-CM | POA: Diagnosis not present

## 2023-10-03 DIAGNOSIS — I10 Essential (primary) hypertension: Secondary | ICD-10-CM

## 2023-10-03 DIAGNOSIS — R600 Localized edema: Secondary | ICD-10-CM

## 2023-10-03 MED ORDER — FUROSEMIDE 20 MG PO TABS
20.0000 mg | ORAL_TABLET | Freq: Two times a day (BID) | ORAL | 0 refills | Status: DC | PRN
  Filled 2023-10-03: qty 90, 45d supply, fill #0

## 2023-10-03 MED ORDER — FUROSEMIDE 20 MG PO TABS
20.0000 mg | ORAL_TABLET | Freq: Two times a day (BID) | ORAL | 3 refills | Status: DC | PRN
  Filled 2023-10-03: qty 180, 90d supply, fill #0

## 2023-10-03 NOTE — Patient Instructions (Signed)
 VISIT SUMMARY:  During your visit, we discussed your ongoing lower leg swelling, carpal tunnel syndrome, and depression. We reviewed your current medications and made some adjustments to better manage your symptoms.  YOUR PLAN:  -LOWER EXTREMITY EDEMA: Lower extremity edema means swelling in your lower legs, often due to fluid buildup. We will increase your Lasix dosage to 20 mg twice daily to help reduce the swelling. An echocardiogram will be done to check your heart function, and an ultrasound of your legs will be scheduled in May to look for blood clots and venous insufficiency. You should wear compression stockings and elevate your legs above heart level when possible. We will also monitor your potassium levels in one week due to the increased Lasix dosage. Please follow up for a blood pressure check in three months.  -CARPAL TUNNEL SYNDROME: Carpal tunnel syndrome is a condition that causes pain, numbness, and tingling in the hands due to pressure on the median nerve. Since the wrist braces have not been helpful, we discussed considering alternative management options if your symptoms persist.  -DEPRESSION WITH PASSIVE SUICIDAL IDEATION: Depression with passive suicidal ideation means you have feelings of depression and occasional thoughts of suicide without any active plans to harm yourself. You are currently engaged in counseling and psychiatric care. Please continue to follow up with your psychiatrist and counselor. If you experience chest pain, shortness of breath, palpitations, or other concerning symptoms, seek emergency care immediately.  INSTRUCTIONS:  Please have your potassium levels checked in one week due to the increased Lasix dosage. Follow up for a blood pressure check in three months. An echocardiogram will be scheduled to assess your heart function, and an ultrasound of your legs will be done in May to check for blood clots and venous insufficiency. Continue to follow up with  your psychiatrist and counselor for your depression. Seek emergency care if you experience chest pain, shortness of breath, palpitations, or other concerning symptoms.  For urgent psychiatric assistance you can go to   [facility] Phone: [phone]  [address] [place], [place] [address]  Hours: Open 24/7, No appointment required.  [facility]  [address] [place], [place] [address] Phone: [phone]  When you're going through tough times, it's easy to feel lonely and overwhelmed. Remember that YOU ARE NOT ALONE and we at [facility] want to support you during this difficult time.  There are many ways to get help and support when you are ready.  You can call the following help lines: - National suicide hotline ((406)832-5275) - National [employer] Network (1-800-SUICIDE)   You can schedule an appointment with a team member with your primary care doc or one of our counselors.  We are all here to support you.  A doctor is on call 24/7   There are many treatments that can help people during difficult times, and we can talk with you about medications, counseling, diet, and other options. We want to offer you HOPE that it won't always feel this bad.   If you are seriously thinking about hurting yourself or have a plan, please call 911 or go to any emergency room right away for immediate help.

## 2023-10-03 NOTE — Progress Notes (Signed)
 Assessment/Plan:   Assessment & Plan Lower extremity edema Chronic lower extremity edema, likely due to venous insufficiency, persistent despite Lasix (furosemide) use. Previous labs ruled out liver disease, kidney disease, anemia, and heart failure. Edema is two plus, extends to mid-shin, and worsens with prolonged standing, causing discomfort and pitting edema. Lasix reduces swelling and blood pressure, but symptoms persist. Blood pressure is 138/85 mmHg. - Increase Lasix to 20 mg twice daily - Order echocardiogram to assess cardiac function - Schedule ultrasound of the legs in May to assess for blood clots and venous insufficiency - Recommend use of compression stockings - Advise elevation of legs above heart level when possible - Monitor potassium levels in one week due to increased Lasix dosage - Follow up for blood pressure check in three months  Carpal tunnel syndrome Bilateral carpal tunnel syndrome with inadequate relief from wrist braces. No improvement with current management. Discussed potential exacerbation of symptoms with Apple Watch use. - Consider alternative management options if symptoms persist  Depression with passive suicidal ideation Depression with passive suicidal ideation. Denies active suicidal ideation or plans to harm himself or others. Mood is variable, currently engaged in counseling and psychiatric care. Encouraged to follow up with psychiatrist and counselor. Suicidal resources provided in the electronic after-visit summary. - Advise to seek emergency care if experiencing chest pain, shortness of breath, palpitations, or other concerning symptoms      Medications Discontinued During This Encounter  Medication Reason   furosemide (LASIX) 20 MG tablet    furosemide (LASIX) 20 MG tablet     Return in about 3 months (around 01/02/2024) for in 1 week for lab work, 3 months for BP.    Subjective:   Encounter date: 10/03/2023  Phillip Gill is a  48 y.o. male who has Anxiety; Other constipation; Depression; Fibromyalgia muscle pain; Anal condyloma; Gynecomastia; HTN (hypertension); Chronic hepatitis B (HCC); LGV (lymphogranuloma venereum); Primary syphilis; Colitis; HIV (human immunodeficiency virus infection) (HCC); Tobacco abuse; Overweight; Chronic pain associated with significant psychosocial dysfunction; Gonorrhea; Human immunodeficiency virus (HIV) disease (HCC); Foot lesion; Hepatitis B surface antigen positive; Healthcare maintenance; Erectile dysfunction; Cervical pain; Human monkeypox; Encounter for physical examination related to employment; Snoring; Urinary frequency; Left foot pain; Poor social situation; MDD (major depressive disorder); Passive suicidal ideations; OSA on CPAP; MDD (major depressive disorder), recurrent severe, without psychosis (HCC); Gastroesophageal reflux disease; Neck mass; and Nocturia on their problem list..   He  has a past medical history of Anxiety, Arthritis, Chronic hepatitis B (HCC), Chronic lower back pain, Depression, Fibromyalgia, Genital warts, HIV disease (HCC) (02/28/2015), HIV infection (HCC), Hypertension, IBS (irritable bowel syndrome), Migraine, Overweight (07/20/2015), and Renal insufficiency (12/29/2013).Marland Kitchen   He presents with chief complaint of Hypertension (2 week follow up HTN/Pt C/O of feet swelling and mild pain when standing for long periods of time (ED visit for concern was on 09/16/2023)) .   Discussed the use of AI scribe software for clinical note transcription with the patient, who gave verbal consent to proceed.  History of Present Illness Phillip Gill "Phillip Gill" is a 48 year old male who presents with lower leg swelling.  He has been experiencing persistent lower leg swelling despite treatment. Two weeks ago, he had elevated blood pressure, but lab work was mostly normal. His current blood pressure is 138/85. He takes Lasix 20 mg daily, which has significantly reduced the swelling,  though some remains around the ankle, worsening with prolonged standing. He experiences discomfort and pain in the ankle when  pressure is applied. No chest pain, shortness of breath, or palpitations.  He is currently on a regimen of Lasix 20 mg daily, amlodipine, olmesartan, and hydrochlorothiazide for blood pressure management. He notes that the Lasix prescription has no refills, and he initially received 90 pills.  He reports issues with his hands, diagnosed as carpal tunnel syndrome, for which he was given a bracelet for both sides, but it has not been helpful.  He has a history of depression and passive suicidal ideation but denies any current plan to harm himself or others. He is engaged in counseling and psychiatric care, having last seen a counselor on March 25th. He describes his mood as fluctuating.       10/03/2023    9:09 AM 09/24/2023   10:23 AM 09/09/2023   10:16 AM 08/08/2023    4:23 PM 08/01/2023    9:36 AM  Depression screen PHQ 2/9  Decreased Interest 3 3 3 3 1   Down, Depressed, Hopeless 3 3 3 3 1   PHQ - 2 Score 6 6 6 6 2   Altered sleeping 2 3 3 3 1   Tired, decreased energy 3 3 3 3 1   Change in appetite 3 3 1 3 1   Feeling bad or failure about yourself  3 3 3 3 1   Trouble concentrating 3 3 3 3 1   Moving slowly or fidgety/restless 3 3 3 3 1   Suicidal thoughts 3 1 1  0 0  PHQ-9 Score 26 25 23 24 8   Difficult doing work/chores Extremely dIfficult Extremely dIfficult Extremely dIfficult Somewhat difficult Extremely dIfficult      10/03/2023    9:09 AM 09/24/2023   10:19 AM 09/09/2023   10:13 AM 08/08/2023    4:12 PM  GAD 7 : Generalized Anxiety Score  Nervous, Anxious, on Edge 1 3 3 2   Control/stop worrying 3 3 3 2   Worry too much - different things 3 0 3 3  Trouble relaxing 3 3 1 2   Restless 2 1 1 2   Easily annoyed or irritable 3 3 3 3   Afraid - awful might happen 3 1 3 3   Total GAD 7 Score 18 14 17 17   Anxiety Difficulty Extremely difficult Extremely difficult Somewhat  difficult Somewhat difficult       Past Surgical History:  Procedure Laterality Date   CO2 LASER APPLICATION N/A 02/11/2013   Procedure: CO2 LASER APPLICATION;  Surgeon: Romie Levee, MD;  Location: [facility] [place];  Service: General;  Laterality: N/A;   FOOT SURGERY     HIGH RESOLUTION ANOSCOPY N/A 02/11/2013   Procedure: HIGH RESOLUTION ANOSCOPY WITH BIOPSY, LASER ABLATION;  Surgeon: Romie Levee, MD;  Location: [facility];  Service: General;  Laterality: N/A;   WISDOM TOOTH EXTRACTION      Outpatient Medications Prior to Visit  Medication Sig Dispense Refill   ARIPiprazole (ABILIFY) 5 MG tablet Take 1 tablet (5 mg total) by mouth daily. 90 tablet 0   bictegravir-emtricitabine-tenofovir AF (BIKTARVY) 50-200-25 MG TABS tablet TAKE 1 TABLET BY MOUTH EVERY DAY 30 tablet 5   buPROPion (WELLBUTRIN XL) 150 MG 24 hr tablet Take 1 tablet (150 mg total) by mouth daily. 90 tablet 0   diclofenac Sodium (VOLTAREN) 1 % GEL Apply 4 grams topically 4 (four) times daily to affected joint. 100 g 11   Elastic Bandages & Supports (CARPAL TUNNEL WRIST STABILIZER) MISC 1 each by Does not apply route as needed. 1 each 0   Elastic Bandages & Supports (MEDICAL COMPRESSION  STOCKINGS) MISC 1 each by Does not apply route daily. 1 each 0   escitalopram (LEXAPRO) 20 MG tablet Take 1 tablet (20 mg total) by mouth daily. 90 tablet 0   esomeprazole (NEXIUM) 40 MG capsule Take 1 capsule (40 mg total) by mouth 2 (two) times daily before a meal. 180 capsule 3   famotidine (PEPCID) 40 MG tablet Take 1 tablet (40 mg total) by mouth at bedtime. 90 tablet 0   finasteride (PROSCAR) 5 MG tablet Take 1 tablet (5 mg total) by mouth daily. 30 tablet 1   methylPREDNISolone (MEDROL DOSEPAK) 4 MG TBPK tablet Take as directed 21 tablet 0   olmesartan-hydrochlorothiazide (BENICAR HCT) 40-25 MG tablet Take 1 tablet by mouth daily. 90 tablet 0   polyethylene glycol powder (GLYCOLAX/MIRALAX) 17 GM/SCOOP  powder Dissolve 17 grams in liquid and drink by mouth every day. 476 g 0   rosuvastatin (CRESTOR) 10 MG tablet Take 1 tablet (10 mg total) by mouth daily. 30 tablet 2   tadalafil (CIALIS) 10 MG tablet Take 0.5 tablets (5 mg total) by mouth daily. 45 tablet 0   Vitamin D, Ergocalciferol, (DRISDOL) 1.25 MG (50000 UNIT) CAPS capsule Take 1 capsule (50,000 Units total) by mouth every 7 (seven) days. 8 capsule 0   furosemide (LASIX) 20 MG tablet Take 1 tablet (20 mg total) by mouth daily as needed (swelling in legs). 90 tablet 0   No facility-administered medications prior to visit.    Family History  Problem Relation Age of Onset   Hypertension Mother    Diabetes Mother    Stroke Mother        cerbral aneurysm   Pancreatic cancer Mother    Colon cancer Father    Hypertension Brother    Sleep apnea Cousin    Mental illness Neg Hx    Stomach cancer Neg Hx    Esophageal cancer Neg Hx     Social History   Socioeconomic History   Marital status: Single    Spouse name: Not on file   Number of children: Not on file   Years of education: Not on file   Highest education level: Not on file  Occupational History   Not on file  Tobacco Use   Smoking status: Former    Current packs/day: 0.00    Average packs/day: 1 pack/day for 18.0 years (18.0 ttl pk-yrs)    Types: Cigarettes    Start date: 07/03/2003    Quit date: 07/02/2021    Years since quitting: 2.2    Passive exposure: Never   Smokeless tobacco: Never   Tobacco comments:    Pt vapes everyday  Vaping Use   Vaping status: Former   Quit date: 12/01/2022   Substances: Nicotine  Substance and Sexual Activity   Alcohol use: Yes    Comment: occasional use   Drug use: No   Sexual activity: Not on file    Comment: accepted condoms  Other Topics Concern   Not on file  Social History Narrative   Grew up in [place], now living in [place],-  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    Finished HS.   Doing online classes with [employer]- studying accounting   Previously 2 years of classes at [employer]    Has Partner- [name]- together for 1 year.    Has 2 girls- (born in 2000).          Social Drivers of Corporate investment banker Strain: High Risk (  02/05/2023)   Overall Financial Resource Strain (CARDIA)    Difficulty of Paying Living Expenses: Very hard  Food Insecurity: Food Insecurity Present (02/05/2023)   Hunger Vital Sign    Worried About Running Out of Food in the Last Year: Sometimes true    Ran Out of Food in the Last Year: Sometimes true  Transportation Needs: Unmet Transportation Needs (02/05/2023)   PRAPARE - Administrator, Civil Service (Medical): Yes    Lack of Transportation (Non-Medical): Yes  Physical Activity: Sufficiently Active (07/16/2018)   Received [facility], [facility]   Exercise Vital Sign    Days of Exercise per Week: 4 days    Minutes of Exercise per Session: 150+ min  Stress: Stress Concern Present (03/01/2023)   [employer] of Occupational Health - Occupational Stress Questionnaire    Feeling of Stress : To some extent  Social Connections: Unknown (05/23/2022)   Received from [facility], [facility]   Social Network    Social Network: Not on file  Intimate Partner Violence: Unknown (05/23/2022)   Received from [facility], [facility]   HITS    Physically Hurt: Not on file    Insult or Talk Down To: Not on file    Threaten Physical Harm: Not on file    Scream or Curse: Not on file                                                                                                  Objective:  Physical Exam: BP 138/85 (BP Location: Left Arm, Patient Position: Sitting, Cuff Size: Large)   Pulse 90   Temp (!) 97.3 F (36.3 C) (Temporal)   Resp 18   Wt 253 lb (114.8 kg)   SpO2 95%   BMI 34.31 kg/m    Physical Exam VITALS: BP- 138/85 GENERAL: Alert, cooperative, well developed, no  acute distress. HEENT: Normocephalic, normal oropharynx, moist mucous membranes. CHEST: Clear to auscultation bilaterally, no wheezes, rhonchi, or crackles. CARDIOVASCULAR: Normal heart rate and rhythm, S1 and S2 normal without murmurs. ABDOMEN: Soft, non-tender, non-distended, without organomegaly, normal bowel sounds. EXTREMITIES: No cyanosis or edema. NEUROLOGICAL: Cranial nerves grossly intact, moves all extremities without gross motor or sensory deficit.   Physical Exam Constitutional:      Appearance: Normal appearance.  HENT:     Head: Normocephalic and atraumatic.     Right Ear: Hearing normal.     Left Ear: Hearing normal.     Nose: Nose normal.  Eyes:     General: No scleral icterus.       Right eye: No discharge.        Left eye: No discharge.     Extraocular Movements: Extraocular movements intact.  Cardiovascular:     Rate and Rhythm: Normal rate and regular rhythm.     Heart sounds: Normal heart sounds.  Pulmonary:     Effort: Pulmonary effort is normal.     Breath sounds: Normal breath sounds.  Abdominal:     Palpations: Abdomen is soft.     Tenderness: There  is no abdominal tenderness.  Musculoskeletal:     Right lower leg: 2+ Pitting Edema present.     Left lower leg: 2+ Pitting Edema present.  Skin:    General: Skin is warm.     Findings: No rash.  Neurological:     General: No focal deficit present.     Mental Status: He is alert.     Cranial Nerves: No cranial nerve deficit.  Psychiatric:        Thought Content: Thought content includes suicidal ideation. Thought content does not include homicidal ideation. Thought content does not include suicidal plan.     DG INJECT DIAG/THERA/INC NEEDLE/CATH/PLC EPI/LUMB/SAC W/IMG Result Date: 07/12/2023 CLINICAL DATA:  Lumbosacral spondylosis without myelopathy. 90 % symptomatic relief after previous injection, without side effect or complication. Partial recurrence of symptoms. The patient wishes to repeat.  EXAM: LUMBAR EPIDURAL INJECTION: DIAGNOSTIC EPIDURAL INJECTION: THERAPEUTIC EPIDURAL INJECTION: PROCEDURE: The procedure, risks, benefits, and alternatives were explained to the patient. Questions regarding the procedure were encouraged and answered. The patient understands and consents to the procedure. An interlaminar approach was performed on left at L5-S1. The overlying skin was cleansed and anesthetized. A 20 gauge epidural needle was advanced using loss-of-resistance technique. Injection of Isovue-M 200 shows a good epidural pattern with spread above and below the level of needle placement, primarily on the left. No vascular opacification is seen. 80mg  of Depo-Medrol mixed with 2ml lidocaine 1% were instilled. The procedure was well-tolerated, and the patient was discharged thirty minutes following the injection in good condition. FLUOROSCOPY: Radiation Exposure Index (as provided by the fluoroscopic device): 3.4 mGy air Kerma COMPLICATIONS: None immediate IMPRESSION: Technically successful epidural injection on the left at L5-S1. Electronically Signed   By: Corlis Leak M.D.   On: 07/12/2023 10:46    Recent Results (from the past 2160 hours)  Urine cytology ancillary only     Status: None   Collection Time: 08/01/23 10:02 AM  Result Value Ref Range   Neisseria Gonorrhea Negative    Chlamydia Negative    Comment Normal Reference Ranger Chlamydia - Negative    Comment      Normal Reference Range Neisseria Gonorrhea - Negative  T-helper cells (CD4) count (not at [facility])     Status: None   Collection Time: 08/01/23 10:05 AM  Result Value Ref Range   CD4 T Helper % 35 30 - 61 %   Absolute CD4 1,023 490 - 1,740 cells/uL   Total lymphocyte count 2,965 850 - 3,900 cells/uL    Comment: The analytical performance characteristics of this assay  have been determined by [employer]. The  modifications have not been cleared or approved by the  FDA. This assay has been validated pursuant to the CLIA   regulations and is used for clinical purposes.   Hepatitis B surface antibody,quantitative     Status: Abnormal   Collection Time: 08/01/23 10:05 AM  Result Value Ref Range   Hep B S AB Quant (Post) 6 (L) > OR = 10 mIU/mL    Comment: . Patient does not have immunity to hepatitis B virus. . For additional information, please refer to http://education.[facility].com/faq/FAQ105 (This link is being provided for informational/ educational purposes only).   Hepatitis B surface antigen     Status: None   Collection Time: 08/01/23 10:05 AM  Result Value Ref Range   Hepatitis B Surface Ag NON-REACTIVE NON-REACTIVE    Comment: . For additional information, please refer to  http://education.[facility].com/faq/FAQ202  (This link is  being provided for informational/ educational purposes only.) .   Hepatitis B DNA, ultraquantitative, PCR     Status: None   Collection Time: 08/01/23 10:05 AM  Result Value Ref Range   Hepatitis B DNA Not Detected IU/mL   Hepatitis B virus DNA Not Detected Log IU/mL    Comment: . Reference Range:                          Not Detected       IU/mL                          Not Detected   Log IU/mL . The analytical performance characteristics of this assay have been determined by [employer]. The modifications have not been cleared or approved by the U.S. Food and Drug Administration. This assay has been validated pursuant to the CLIA regulations and is used for clinical purposes. .   RPR     Status: None   Collection Time: 08/01/23 10:05 AM  Result Value Ref Range   RPR Ser Ql NON-REACTIVE NON-REACTIVE    Comment: . No laboratory evidence of syphilis. If recent exposure is suspected, submit a new sample in 2-4 weeks. .   Lipid panel     Status: Abnormal   Collection Time: 08/01/23 10:05 AM  Result Value Ref Range   Cholesterol 199 <200 mg/dL   HDL 42 > OR = 40 mg/dL   Triglycerides 161 (H) <150 mg/dL   LDL Cholesterol (Calc)  130 (H) mg/dL (calc)    Comment: Reference range: <100 . Desirable range <100 mg/dL for primary prevention;   <70 mg/dL for patients with CHD or diabetic patients  with > or = 2 CHD risk factors. Marland Kitchen LDL-C is now calculated using the Martin-Hopkins  calculation, which is a validated novel method providing  better accuracy than the Friedewald equation in the  estimation of LDL-C.  Horald Pollen et al. Lenox Ahr. 0960;454(09): 2061-2068  (http://education.[facility].com/faq/FAQ164)    Total CHOL/HDL Ratio 4.7 <5.0 (calc)   Non-HDL Cholesterol (Calc) 157 (H) <130 mg/dL (calc)    Comment: For patients with diabetes plus 1 major ASCVD risk  factor, treating to a non-HDL-C goal of <100 mg/dL  (LDL-C of <81 mg/dL) is considered a therapeutic  option.   CBC with Differential     Status: Abnormal   Collection Time: 09/16/23  8:09 AM  Result Value Ref Range   WBC 10.6 (H) 4.0 - 10.5 K/uL   RBC 4.79 4.22 - 5.81 MIL/uL   Hemoglobin 13.9 13.0 - 17.0 g/dL   HCT 19.1 47.8 - 29.5 %   MCV 89.4 80.0 - 100.0 fL   MCH 29.0 26.0 - 34.0 pg   MCHC 32.5 30.0 - 36.0 g/dL   RDW 62.1 30.8 - 65.7 %   Platelets 254 150 - 400 K/uL   nRBC 0.0 0.0 - 0.2 %   Neutrophils Relative % 55 %   Neutro Abs 5.9 1.7 - 7.7 K/uL   Lymphocytes Relative 34 %   Lymphs Abs 3.6 0.7 - 4.0 K/uL   Monocytes Relative 9 %   Monocytes Absolute 1.0 0.1 - 1.0 K/uL   Eosinophils Relative 1 %   Eosinophils Absolute 0.1 0.0 - 0.5 K/uL   Basophils Relative 0 %   Basophils Absolute 0.0 0.0 - 0.1 K/uL   Immature Granulocytes 1 %   Abs Immature Granulocytes 0.05 0.00 - 0.07 K/uL  Comment: Performed at [facility], [address], [place], [place] [address]  Comprehensive metabolic panel     Status: Abnormal   Collection Time: 09/16/23  8:09 AM  Result Value Ref Range   Sodium 138 135 - 145 mmol/L   Potassium 3.8 3.5 - 5.1 mmol/L   Chloride 104 98 - 111 mmol/L   CO2 30 22 - 32 mmol/L   Glucose, Bld 131 (H) 70  - 99 mg/dL    Comment: Glucose reference range applies only to samples taken after fasting for at least 8 hours.   BUN 10 6 - 20 mg/dL   Creatinine, Ser 4.40 0.61 - 1.24 mg/dL   Calcium 9.4 8.9 - 34.7 mg/dL   Total Protein 8.2 (H) 6.5 - 8.1 g/dL   Albumin 4.5 3.5 - 5.0 g/dL   AST 21 15 - 41 U/L   ALT 19 0 - 44 U/L   Alkaline Phosphatase 84 38 - 126 U/L   Total Bilirubin 0.5 0.0 - 1.2 mg/dL   GFR, Estimated >42 >59 mL/min    Comment: (NOTE) Calculated using the CKD-EPI Creatinine Equation (2021)    Anion gap 4 (L) 5 - 15    Comment: Performed at [facility], [address] [place], [place] [address]  B Nat Peptide     Status: None   Collection Time: 09/19/23  9:46 AM  Result Value Ref Range   Pro B Natriuretic peptide (BNP) 18.0 0.0 - 100.0 pg/mL  Thyroid Panel With TSH     Status: None   Collection Time: 09/19/23  9:46 AM  Result Value Ref Range   T3 Uptake 27 22 - 35 %   T4, Total 6.0 4.9 - 10.5 mcg/dL   Free Thyroxine Index 1.6 1.4 - 3.8   TSH 1.39 0.40 - 4.50 mIU/L  Urinalysis w microscopic + reflex cultur     Status: None   Collection Time: 09/19/23  9:46 AM   Specimen: Blood  Result Value Ref Range   Color, Urine YELLOW YELLOW   APPearance CLEAR CLEAR   Specific Gravity, Urine 1.008 1.001 - 1.035   pH 5.5 5.0 - 8.0   Glucose, UA NEGATIVE NEGATIVE   Bilirubin Urine NEGATIVE NEGATIVE   Ketones, ur NEGATIVE NEGATIVE   Hgb urine dipstick NEGATIVE NEGATIVE   Protein, ur NEGATIVE NEGATIVE   Nitrites, Initial NEGATIVE NEGATIVE   Leukocyte Esterase NEGATIVE NEGATIVE   WBC, UA NONE SEEN 0 - 5 /HPF   RBC / HPF NONE SEEN 0 - 2 /HPF   Squamous Epithelial / HPF NONE SEEN < OR = 5 /HPF   Bacteria, UA NONE SEEN NONE SEEN /HPF   Hyaline Cast NONE SEEN NONE SEEN /LPF   Note      Comment: This urine was analyzed for the presence of WBC,  RBC, bacteria, casts, and other formed elements.  Only those elements seen were reported. Marland Kitchen Donnella Bi /  creatinine urine ratio     Status: None   Collection Time: 09/19/23  9:46 AM  Result Value Ref Range   Microalb, Ur <0.7 mg/dL   Creatinine,U 56.4 mg/dL   Microalb Creat Ratio Unable to calculate 0.0 - 30.0 mg/g    Comment: Unable to Calculate due to Microalbumin Result of <0.7 mg/dL  REFLEXIVE URINE CULTURE     Status: None   Collection Time: 09/19/23  9:46 AM  Result Value Ref Range   Reflexve Urine Culture      Comment: NO  CULTURE INDICATED        Garner Nash, MD, MS

## 2023-10-04 ENCOUNTER — Other Ambulatory Visit (HOSPITAL_BASED_OUTPATIENT_CLINIC_OR_DEPARTMENT_OTHER): Payer: Self-pay

## 2023-10-07 ENCOUNTER — Encounter (HOSPITAL_BASED_OUTPATIENT_CLINIC_OR_DEPARTMENT_OTHER): Payer: Self-pay | Admitting: Emergency Medicine

## 2023-10-07 ENCOUNTER — Telehealth: Payer: Self-pay

## 2023-10-07 ENCOUNTER — Other Ambulatory Visit (INDEPENDENT_AMBULATORY_CARE_PROVIDER_SITE_OTHER): Payer: MEDICAID

## 2023-10-07 ENCOUNTER — Emergency Department (HOSPITAL_BASED_OUTPATIENT_CLINIC_OR_DEPARTMENT_OTHER)
Admission: EM | Admit: 2023-10-07 | Discharge: 2023-10-07 | Disposition: A | Discharge status: Home / Self Care | Payer: MEDICAID | Attending: Emergency Medicine | Admitting: Emergency Medicine

## 2023-10-07 ENCOUNTER — Other Ambulatory Visit: Payer: Self-pay

## 2023-10-07 ENCOUNTER — Encounter: Payer: Self-pay | Admitting: Family Medicine

## 2023-10-07 DIAGNOSIS — Z21 Asymptomatic human immunodeficiency virus [HIV] infection status: Secondary | ICD-10-CM | POA: Diagnosis not present

## 2023-10-07 DIAGNOSIS — I10 Essential (primary) hypertension: Secondary | ICD-10-CM | POA: Insufficient documentation

## 2023-10-07 DIAGNOSIS — R2243 Localized swelling, mass and lump, lower limb, bilateral: Secondary | ICD-10-CM | POA: Insufficient documentation

## 2023-10-07 DIAGNOSIS — E871 Hypo-osmolality and hyponatremia: Secondary | ICD-10-CM | POA: Insufficient documentation

## 2023-10-07 DIAGNOSIS — Z7984 Long term (current) use of oral hypoglycemic drugs: Secondary | ICD-10-CM | POA: Insufficient documentation

## 2023-10-07 DIAGNOSIS — N179 Acute kidney failure, unspecified: Secondary | ICD-10-CM | POA: Diagnosis not present

## 2023-10-07 DIAGNOSIS — R739 Hyperglycemia, unspecified: Secondary | ICD-10-CM

## 2023-10-07 DIAGNOSIS — E1165 Type 2 diabetes mellitus with hyperglycemia: Secondary | ICD-10-CM | POA: Insufficient documentation

## 2023-10-07 DIAGNOSIS — E119 Type 2 diabetes mellitus without complications: Secondary | ICD-10-CM

## 2023-10-07 DIAGNOSIS — R7989 Other specified abnormal findings of blood chemistry: Secondary | ICD-10-CM

## 2023-10-07 LAB — URINALYSIS, ROUTINE W REFLEX MICROSCOPIC
Bilirubin Urine: NEGATIVE
Glucose, UA: >=500 mg/dL — AB
Hgb urine dipstick: NEGATIVE
Ketones, ur: NEGATIVE mg/dL
Leukocytes,Ua: NEGATIVE
Nitrite: NEGATIVE
Protein, ur: NEGATIVE mg/dL
Specific Gravity, Urine: 1.01 (ref 1.005–1.030)
pH: 5.5 (ref 5.0–8.0)

## 2023-10-07 LAB — I-STAT VENOUS BLOOD GAS, ED
Acid-Base Excess: 3 mmol/L — ABNORMAL HIGH (ref 0.0–2.0)
Bicarbonate: 28 mmol/L (ref 20.0–28.0)
Calcium, Ion: 1.09 mmol/L — ABNORMAL LOW (ref 1.15–1.40)
HCT: 43 % (ref 39.0–52.0)
Hemoglobin: 14.6 g/dL (ref 13.0–17.0)
O2 Saturation: 91 %
Potassium: 4.2 mmol/L (ref 3.5–5.1)
Sodium: 130 mmol/L — ABNORMAL LOW (ref 135–145)
TCO2: 29 mmol/L (ref 22–32)
pCO2, Ven: 43.9 mmHg — ABNORMAL LOW (ref 44–60)
pH, Ven: 7.413 (ref 7.25–7.43)
pO2, Ven: 60 mmHg — ABNORMAL HIGH (ref 32–45)

## 2023-10-07 LAB — BASIC METABOLIC PANEL WITH GFR
Anion gap: 13 (ref 5–15)
BUN: 20 mg/dL (ref 6–23)
BUN: 22 mg/dL — ABNORMAL HIGH (ref 6–20)
CO2: 28 meq/L (ref 19–32)
CO2: 26 mmol/L (ref 22–32)
Calcium: 9.2 mg/dL (ref 8.4–10.5)
Calcium: 8.7 mg/dL — ABNORMAL LOW (ref 8.9–10.3)
Chloride: 87 meq/L — ABNORMAL LOW (ref 96–112)
Chloride: 88 mmol/L — ABNORMAL LOW (ref 98–111)
Creatinine, Ser: 1.8 mg/dL — ABNORMAL HIGH (ref 0.40–1.50)
Creatinine, Ser: 1.63 mg/dL — ABNORMAL HIGH (ref 0.61–1.24)
GFR, Estimated: 52 mL/min — ABNORMAL LOW (ref >60)
GFR: 44.35 mL/min — ABNORMAL LOW (ref >60.00)
Glucose, Bld: 782 mg/dL (ref 70–99)
Glucose, Bld: 707 mg/dL (ref 70–99)
Potassium: 4.2 meq/L (ref 3.5–5.1)
Potassium: 4 mmol/L (ref 3.5–5.1)
Sodium: 127 meq/L — ABNORMAL LOW (ref 135–145)
Sodium: 127 mmol/L — ABNORMAL LOW (ref 135–145)

## 2023-10-07 LAB — CBC
HCT: 39.3 % (ref 39.0–52.0)
Hemoglobin: 13.7 g/dL (ref 13.0–17.0)
MCH: 29 pg (ref 26.0–34.0)
MCHC: 34.9 g/dL (ref 30.0–36.0)
MCV: 83.1 fL (ref 80.0–100.0)
Platelets: 230 10*3/uL (ref 150–400)
RBC: 4.73 MIL/uL (ref 4.22–5.81)
RDW: 12.6 % (ref 11.5–15.5)
WBC: 12.8 10*3/uL — ABNORMAL HIGH (ref 4.0–10.5)
nRBC: 0 % (ref 0.0–0.2)

## 2023-10-07 LAB — CBG MONITORING, ED
Glucose-Capillary: >600 mg/dL (ref 70–99)
Glucose-Capillary: 559 mg/dL (ref 70–99)
Glucose-Capillary: >600 mg/dL (ref 70–99)
Glucose-Capillary: 456 mg/dL — ABNORMAL HIGH (ref 70–99)
Glucose-Capillary: 523 mg/dL (ref 70–99)
Glucose-Capillary: 369 mg/dL — ABNORMAL HIGH (ref 70–99)

## 2023-10-07 LAB — BETA-HYDROXYBUTYRIC ACID: Beta-Hydroxybutyric Acid: 0.11 mmol/L (ref 0.05–0.27)

## 2023-10-07 LAB — URINALYSIS, MICROSCOPIC (REFLEX)
Bacteria, UA: NONE SEEN
Squamous Epithelial / HPF: NONE SEEN /HPF (ref 0–5)

## 2023-10-07 MED ORDER — INSULIN ASPART 100 UNIT/ML IJ SOLN
10.0000 [IU] | Freq: Once | INTRAMUSCULAR | Status: AC
  Administered 2023-10-07: 10 [IU] via SUBCUTANEOUS

## 2023-10-07 MED ORDER — LACTATED RINGERS IV BOLUS
2000.0000 mL | Freq: Once | INTRAVENOUS | Status: AC
  Administered 2023-10-07: 2000 mL via INTRAVENOUS

## 2023-10-07 MED ORDER — DEXTROSE 50 % IV SOLN: 0.0000 mL | INTRAVENOUS | Status: DC | PRN

## 2023-10-07 MED ORDER — LACTATED RINGERS IV SOLN: INTRAVENOUS | Status: DC

## 2023-10-07 MED ORDER — METFORMIN HCL 500 MG PO TABS
500.0000 mg | ORAL_TABLET | Freq: Two times a day (BID) | ORAL | 0 refills | Status: DC
  Filled 2023-10-07: qty 60, 30d supply, fill #0

## 2023-10-07 MED ORDER — INSULIN REGULAR(HUMAN) IN NACL 100-0.9 UT/100ML-% IV SOLN
INTRAVENOUS | Status: DC
  Administered 2023-10-07: 9.5 [IU]/h via INTRAVENOUS
  Filled 2023-10-07: qty 100

## 2023-10-07 MED ORDER — DEXTROSE IN LACTATED RINGERS 5 % IV SOLN: INTRAVENOUS | Status: DC

## 2023-10-07 NOTE — Discharge Instructions (Addendum)
 It was a pleasure taking care of you here today.  Your blood sugar was significantly elevated this is indicative of a new diagnosis of diabetes.  We discussed admission to the hospital which you declined.  I would follow a low carbohydrate diet.  I would recommend following back up with your primary care provider tomorrow morning.  Please give them a call as soon as they open for an appointment tomorrow  Make sure to follow-up outpatient, return for any worsening symptoms.

## 2023-10-07 NOTE — ED Notes (Signed)
 Endotool delayed per provider instruction.

## 2023-10-07 NOTE — ED Provider Notes (Signed)
 [facility] Provider Note   CSN: [id number] Arrival date & time: 10/07/23  1801    History  Chief Complaint  Patient presents with   Hyperglycemia    Phillip Gill is a 48 y.o. male here for evaluation abnormal labs.  History of HIV, hep B, fibromyalgia, hypertension, chronic lower extremity swelling here for evaluation of abnormal labs.  He was seen by PCP about a week ago for follow-up.  He had some concerns for persistent lower extremity swelling to his bilateral legs.  Patient states been going on for months.  He was previously on 20 mg of Lasix.  His PCP increase this to twice daily.  He had labs done today to follow-up to check his potassium given increase in Lasix.  Is noted to be significantly hyperglycemic.  He denies any history of diabetes.  He states he has had polyuria and polydipsia for quite some time.  He thinks most about a year.  He denies any recent steroid use.  No fever, cough, chest pain, shortness of breath, abdominal pain.  He states his lower extremity swelling improved with Lasix.  He denies any official diagnosis of heart failure.  No PND, orthopnea.  Denies any recent falls or injuries.  HPI     Home Medications Prior to Admission medications   Medication Sig Start Date End Date Taking? Authorizing Provider  metFORMIN (GLUCOPHAGE) 500 MG tablet Take 1 tablet (500 mg total) by mouth 2 (two) times daily with a meal. 10/07/23  Yes Nannie Starzyk A, PA-C  ARIPiprazole (ABILIFY) 5 MG tablet Take 1 tablet (5 mg total) by mouth daily. 09/18/23   Park Pope, MD  bictegravir-emtricitabine-tenofovir AF (BIKTARVY) 50-200-25 MG TABS tablet TAKE 1 TABLET BY MOUTH EVERY DAY 08/01/23   Veryl Speak, FNP  buPROPion (WELLBUTRIN XL) 150 MG 24 hr tablet Take 1 tablet (150 mg total) by mouth daily. 09/18/23 12/18/23  Park Pope, MD  diclofenac Sodium (VOLTAREN) 1 % GEL Apply 4 grams topically 4 (four) times daily to affected joint.  12/19/22   Rodolph Bong, MD  Elastic Bandages & Supports (CARPAL TUNNEL WRIST STABILIZER) MISC 1 each by Does not apply route as needed. 09/19/23   Garnette Gunner, MD  Elastic Bandages & Supports (MEDICAL COMPRESSION STOCKINGS) MISC 1 each by Does not apply route daily. 09/19/23 09/13/24  Garnette Gunner, MD  escitalopram (LEXAPRO) 20 MG tablet Take 1 tablet (20 mg total) by mouth daily. 09/18/23   Park Pope, MD  esomeprazole (NEXIUM) 40 MG capsule Take 1 capsule (40 mg total) by mouth 2 (two) times daily before a meal. 06/20/23   Hilarie Fredrickson, MD  famotidine (PEPCID) 40 MG tablet Take 1 tablet (40 mg total) by mouth at bedtime. 09/19/23 12/18/23  Garnette Gunner, MD  finasteride (PROSCAR) 5 MG tablet Take 1 tablet (5 mg total) by mouth daily. 09/19/23   Garnette Gunner, MD  furosemide (LASIX) 20 MG tablet Take 1 tablet (20 mg total) by mouth 2 (two) times daily as needed (swelling in legs). 10/03/23 09/27/24  Garnette Gunner, MD  methylPREDNISolone (MEDROL DOSEPAK) 4 MG TBPK tablet Take as directed 10/01/23   Tarry Kos, MD  olmesartan-hydrochlorothiazide (BENICAR HCT) 40-25 MG tablet Take 1 tablet by mouth daily. 09/19/23 12/18/23  Garnette Gunner, MD  polyethylene glycol powder (GLYCOLAX/MIRALAX) 17 GM/SCOOP powder Dissolve 17 grams in liquid and drink by mouth every day. 09/19/23   Garnette Gunner, MD  rosuvastatin (CRESTOR) 10 MG tablet Take 1 tablet (10 mg total) by mouth daily. 08/27/23   Veryl Speak, FNP  tadalafil (CIALIS) 10 MG tablet Take 0.5 tablets (5 mg total) by mouth daily. 09/19/23 12/18/23  Garnette Gunner, MD  Vitamin D, Ergocalciferol, (DRISDOL) 1.25 MG (50000 UNIT) CAPS capsule Take 1 capsule (50,000 Units total) by mouth every 7 (seven) days. 09/18/23   Park Pope, MD      Allergies    Bactrim [sulfamethoxazole w/trimethoprim (co-trimoxazole)], Sulfa antibiotics, Cymbalta [duloxetine hcl], Duloxetine, Other, Zoloft [sertraline hcl], Clindamycin/lincomycin, and  Neurontin [gabapentin]    Review of Systems   Review of Systems  Constitutional: Negative.   HENT: Negative.    Respiratory: Negative.    Cardiovascular:  Positive for leg swelling (chronic, improved from baseline). Negative for chest pain and palpitations.  Gastrointestinal: Negative.   Endocrine: Positive for polydipsia and polyuria.  Genitourinary: Negative.   Musculoskeletal: Negative.   Skin: Negative.   Neurological: Negative.   All other systems reviewed and are negative.   Physical Exam Updated Vital Signs BP (!) 146/89   Pulse 80   Temp 98.3 F (36.8 C) (Oral)   Resp (!) 22   Wt 115 kg   SpO2 97%   BMI 34.38 kg/m  Physical Exam Vitals and nursing note reviewed.  Constitutional:      General: He is not in acute distress.    Appearance: He is well-developed. He is not ill-appearing, toxic-appearing or diaphoretic.  HENT:     Head: Normocephalic and atraumatic.     Nose: Nose normal.     Mouth/Throat:     Mouth: Mucous membranes are moist.  Eyes:     Pupils: Pupils are equal, round, and reactive to light.  Cardiovascular:     Rate and Rhythm: Normal rate and regular rhythm.     Pulses: Normal pulses.     Heart sounds: Normal heart sounds.  Pulmonary:     Effort: Pulmonary effort is normal. No respiratory distress.     Breath sounds: Normal breath sounds.  Abdominal:     General: Bowel sounds are normal. There is no distension.     Palpations: Abdomen is soft.     Tenderness: There is no abdominal tenderness. There is no guarding or rebound.  Musculoskeletal:        General: Normal range of motion.     Cervical back: Normal range of motion and neck supple.     Comments: No bony tenderness, compartments soft.  Trace mid shin edema.  Full range of motion to extremities  Skin:    General: Skin is warm and dry.     Capillary Refill: Capillary refill takes less than 2 seconds.     Comments: No obvious rashes or lesions to exposed skin  Neurological:      General: No focal deficit present.     Mental Status: He is alert and oriented to person, place, and time.     ED Results / Procedures / Treatments   Labs (all labs ordered are listed, but only abnormal results are displayed) Labs Reviewed  BASIC METABOLIC PANEL WITH GFR - Abnormal; Notable for the following components:      Result Value   Sodium 127 (*)    Chloride 88 (*)    Glucose, Bld 707 (*)    BUN 22 (*)    Creatinine, Ser 1.63 (*)    Calcium 8.7 (*)    GFR, Estimated 52 (*)    All  other components within normal limits  CBC - Abnormal; Notable for the following components:   WBC 12.8 (*)    All other components within normal limits  URINALYSIS, ROUTINE W REFLEX MICROSCOPIC - Abnormal; Notable for the following components:   Glucose, UA >=500 (*)    All other components within normal limits  CBG MONITORING, ED - Abnormal; Notable for the following components:   Glucose-Capillary >600 (*)    All other components within normal limits  I-STAT VENOUS BLOOD GAS, ED - Abnormal; Notable for the following components:   pCO2, Ven 43.9 (*)    pO2, Ven 60 (*)    Acid-Base Excess 3.0 (*)    Sodium 130 (*)    Calcium, Ion 1.09 (*)    All other components within normal limits  CBG MONITORING, ED - Abnormal; Notable for the following components:   Glucose-Capillary >600 (*)    All other components within normal limits  CBG MONITORING, ED - Abnormal; Notable for the following components:   Glucose-Capillary 559 (*)    All other components within normal limits  CBG MONITORING, ED - Abnormal; Notable for the following components:   Glucose-Capillary 523 (*)    All other components within normal limits  CBG MONITORING, ED - Abnormal; Notable for the following components:   Glucose-Capillary 456 (*)    All other components within normal limits  CBG MONITORING, ED - Abnormal; Notable for the following components:   Glucose-Capillary 369 (*)    All other components within normal  limits  BETA-HYDROXYBUTYRIC ACID  URINALYSIS, MICROSCOPIC (REFLEX)    EKG None  Radiology No results found.  Procedures .Critical Care  Performed by: Linwood Dibbles, PA-C Authorized by: Linwood Dibbles, PA-C   Critical care provider statement:    Critical care time (minutes):  35   Critical care was necessary to treat or prevent imminent or life-threatening deterioration of the following conditions:  Dehydration and metabolic crisis   Critical care was time spent personally by me on the following activities:  Development of treatment plan with patient or surrogate, discussions with consultants, evaluation of patient's response to treatment, examination of patient, ordering and review of laboratory studies, ordering and review of radiographic studies, ordering and performing treatments and interventions, pulse oximetry, re-evaluation of patient's condition and review of old charts     Medications Ordered in ED Medications  insulin regular, human (MYXREDLIN) 100 units/ 100 mL infusion (4 Units/hr Intravenous Rate/Dose Change 10/07/23 2302)  lactated ringers infusion (has no administration in time range)  dextrose 5 % in lactated ringers infusion (has no administration in time range)  dextrose 50 % solution 0-50 mL (has no administration in time range)  lactated ringers bolus 2,000 mL (0 mLs Intravenous Stopped 10/07/23 2131)  insulin aspart (novoLOG) injection 10 Units (10 Units Subcutaneous Given 10/07/23 1923)   ED Course/ Medical Decision Making/ A&P   48 year old complex medical history here for evaluation of abnormal lab.  Noted to have new hyperglycemia while having labs checked today due to an increase in Lasix.  No history of CHF.  No PND, orthopnea, shortness of breath or chest pain.  He states he has had chronic lower extremity which goes back almost a year.  No recent falls or injuries.  No history of PE or DVT.  He states lower extremity swelling had actually improved  after taking the Lasix provided to by his PCP.  Does also state he has had polyuria and polydipsia for quite some time which  he states is many months.  No recent history of steroid use.  Will plan on labs, reassess.  I reviewed his labs from his PCP which show hyperglycemia, pseudohyponatremia and AKI  Labs personally viewed and interpreted:  CBG greater than 600 VBG pH 7.4, bicarb 28, potassium 4.2--patient given IV fluid bolus, insulin bolus started on drip Metabolic panel sodium 127 suspect pseudohyponatremia in setting of hyperglycemia, creatinine 1.63 up from baseline 1.1 CBC leukocytosis 12.8 UA glucosuria, no ketones   After 2 L IV fluids as well as 10 units insulin bolus patient blood sugar still reads high, greater than 600.  I discussed with the patient for admission.  He states he does not want to be admitted.  Patient DECLINES admission, we discussed risk versus benefit.  He voiced understanding however continues to decline admission.  Will start him on insulin drip to try and bring his blood sugars down.  Recheck CBG 559 still does not want to be admitted beta hydroxy 0.1 Low suspicion for DKA, [facility]  Continued on drip  Repeat blood sugar 369.  Will have him follow-up outpatient.  Will need to be seen by PCP within 24 hours given he did not want admission.  Vaslow carbohydrate diet.  He will return for any worsening symptoms.  The patient has been appropriately medically screened and/or stabilized in the ED. I have low suspicion for any other emergent medical condition which would require further screening, evaluation or treatment in the ED or require inpatient management.  Patient is hemodynamically stable and in no acute distress.  Patient able to ambulate in department prior to ED.  Evaluation does not show acute pathology that would require ongoing or additional emergent interventions while in the emergency department or further inpatient treatment.  I have discussed the  diagnosis with the patient and answered all questions.  Pain is been managed while in the emergency department and patient has no further complaints prior to discharge.  Patient is comfortable with plan discussed in room and is stable for discharge at this time.  I have discussed strict return precautions for returning to the emergency department.  Patient was encouraged to follow-up with PCP/specialist refer to at discharge.                                   Medical Decision Making Amount and/or Complexity of Data Reviewed External Data Reviewed: labs, radiology and notes. Labs: ordered. Decision-making details documented in ED Course.  Risk OTC drugs. Prescription drug management. Parenteral controlled substances. Decision regarding hospitalization. Diagnosis or treatment significantly limited by social determinants of health.          Final Clinical Impression(s) / ED Diagnoses Final diagnoses:  Hyperglycemia  Diabetes mellitus, new onset (HCC)  AKI (acute kidney injury) (HCC)  Pseudohyponatremia    Rx / DC Orders ED Discharge Orders          Ordered    metFORMIN (GLUCOPHAGE) 500 MG tablet  2 times daily with meals        10/07/23 2316              Cammie Faulstich A, PA-C 10/07/23 2327    Anders Simmonds T, DO 10/08/23 1612

## 2023-10-07 NOTE — ED Triage Notes (Signed)
 Sent by PCP for hyperglycemia , no Dx diabetes , also has low sodium level .  Alert and  oriented x 4 .

## 2023-10-07 NOTE — ED Notes (Signed)
 Pt CBG reads HI, RN Tiiu informed

## 2023-10-07 NOTE — ED Notes (Signed)
 Checked CBG 559, RN Tiiu informed

## 2023-10-07 NOTE — Telephone Encounter (Signed)
 CRITICAL VALUE STICKER  CRITICAL VALUE: glucose 782  RECEIVER (on-site recipient of call): Beya Tipps  DATE & TIME NOTIFIED: 10/07/23 @ 1309  MESSENGER (representative from lab): Hope  MD NOTIFIED: Janee Morn  TIME OF NOTIFICATION: 1312  RESPONSE:  MD notified for review.

## 2023-10-08 ENCOUNTER — Encounter: Payer: Self-pay | Admitting: Family Medicine

## 2023-10-08 ENCOUNTER — Other Ambulatory Visit: Payer: Self-pay

## 2023-10-08 ENCOUNTER — Telehealth: Payer: Self-pay

## 2023-10-08 ENCOUNTER — Other Ambulatory Visit (HOSPITAL_COMMUNITY): Payer: Self-pay

## 2023-10-08 ENCOUNTER — Other Ambulatory Visit: Payer: Self-pay | Admitting: Family Medicine

## 2023-10-08 ENCOUNTER — Ambulatory Visit: Payer: MEDICAID | Admitting: Family Medicine

## 2023-10-08 VITALS — BP 136/82 | HR 66 | Temp 97.8°F | Wt 246.4 lb

## 2023-10-08 DIAGNOSIS — Z7984 Long term (current) use of oral hypoglycemic drugs: Secondary | ICD-10-CM

## 2023-10-08 DIAGNOSIS — Z7985 Long-term (current) use of injectable non-insulin antidiabetic drugs: Secondary | ICD-10-CM

## 2023-10-08 DIAGNOSIS — E1165 Type 2 diabetes mellitus with hyperglycemia: Secondary | ICD-10-CM | POA: Insufficient documentation

## 2023-10-08 DIAGNOSIS — E871 Hypo-osmolality and hyponatremia: Secondary | ICD-10-CM

## 2023-10-08 DIAGNOSIS — N179 Acute kidney failure, unspecified: Secondary | ICD-10-CM

## 2023-10-08 DIAGNOSIS — G5603 Carpal tunnel syndrome, bilateral upper limbs: Secondary | ICD-10-CM

## 2023-10-08 LAB — POCT GLYCOSYLATED HEMOGLOBIN (HGB A1C): Hemoglobin A1C: 10.3 % — AB (ref 4.0–5.6)

## 2023-10-08 LAB — MICROALBUMIN / CREATININE URINE RATIO
Creatinine,U: 50.3 mg/dL
Microalb Creat Ratio: 76.3 mg/g — ABNORMAL HIGH (ref 0.0–30.0)
Microalb, Ur: 3.8 mg/dL — ABNORMAL HIGH (ref 0.0–1.9)

## 2023-10-08 LAB — BASIC METABOLIC PANEL WITH GFR
BUN: 18 mg/dL (ref 6–23)
CO2: 27 meq/L (ref 19–32)
Calcium: 9.1 mg/dL (ref 8.4–10.5)
Chloride: 94 meq/L — ABNORMAL LOW (ref 96–112)
Creatinine, Ser: 1.31 mg/dL (ref 0.40–1.50)
GFR: 64.93 mL/min (ref >60.00)
Glucose, Bld: 523 mg/dL (ref 70–99)
Potassium: 4.4 meq/L (ref 3.5–5.1)
Sodium: 130 meq/L — ABNORMAL LOW (ref 135–145)

## 2023-10-08 LAB — GLUCOSE, POCT (MANUAL RESULT ENTRY): POC Glucose: 432 mg/dL — AB (ref 70–99)

## 2023-10-08 MED ORDER — ACCU-CHEK SOFTCLIX LANCETS MISC
1.0000 | Freq: Three times a day (TID) | 0 refills | Status: AC
  Filled 2023-10-08: qty 100, 33d supply, fill #0

## 2023-10-08 MED ORDER — METFORMIN HCL 500 MG PO TABS
500.0000 mg | ORAL_TABLET | Freq: Two times a day (BID) | ORAL | 0 refills | Status: DC
  Filled 2023-10-08: qty 60, 30d supply, fill #0

## 2023-10-08 MED ORDER — BLOOD GLUCOSE TEST VI STRP
1.0000 | ORAL_STRIP | Freq: Three times a day (TID) | 0 refills | Status: AC
  Filled 2023-10-08: qty 100, 34d supply, fill #0

## 2023-10-08 MED ORDER — TIRZEPATIDE 2.5 MG/0.5ML ~~LOC~~ SOAJ
2.5000 mg | SUBCUTANEOUS | 0 refills | Status: DC
  Filled 2023-10-08: qty 2, 28d supply, fill #0

## 2023-10-08 MED ORDER — BLOOD GLUCOSE MONITOR SYSTEM W/DEVICE KIT
1.0000 | PACK | Freq: Three times a day (TID) | 0 refills | Status: DC
  Filled 2023-10-08: qty 1, 30d supply, fill #0

## 2023-10-08 MED ORDER — LANCET DEVICE MISC
1.0000 | Freq: Three times a day (TID) | 0 refills | Status: AC
  Filled 2023-10-08 – 2023-11-04 (×2): qty 1, 30d supply, fill #0

## 2023-10-08 NOTE — Telephone Encounter (Signed)
 CRITICAL VALUE STICKER  CRITICAL VALUE: glucose 523  RECEIVER (on-site recipient of call): Malyn Aytes  DATE & TIME NOTIFIED: 4/8 @ 1447  MESSENGER (representative from lab): Doyle Askew  MD NOTIFIED: Janee Morn  TIME OF NOTIFICATION: 1457  RESPONSE:  Providr aware for review

## 2023-10-08 NOTE — Patient Instructions (Signed)
 VISIT SUMMARY:  You came in today due to elevated blood sugar and changes in kidney function. You recently visited the emergency department where your blood sugar was very high, and it has since decreased but remains elevated. You also have a headache and improved appetite. Your recent medications may have contributed to the onset of diabetes. Your kidney function is still elevated despite treatment, and you have low sodium levels likely due to high blood sugar.  YOUR PLAN:  -NEWLY DIAGNOSED DIABETES MELLITUS: Diabetes mellitus is a condition where your blood sugar levels are too high. Your blood sugar was very high recently, and it is still elevated. We suspect your diabetes may have been triggered by recent medications. We will start you on metformin 500 mg twice daily and Mounjaro (tirzepatide) 2.5 mg once weekly to help control your blood sugar. Greggory Keen also offers benefits for your heart and kidneys and may help with weight loss. You will be referred to a diabetes educator, a nutritionist, and a pharmacy team for comprehensive management. You will also receive a glucose meter to monitor your blood sugar at home. We will perform a C-peptide test to better understand your diabetes type. Be aware of potential side effects like gastrointestinal upset from [facility] and diarrhea from metformin.  -ELEVATED KIDNEY FUNCTION TESTS: Your kidney function tests are still high, which may be related to your high blood sugar. We will monitor your kidney function closely and repeat the tests. We will also check your urine for protein and glucose levels.  -HYPONATREMIA: Hyponatremia means low sodium levels in your blood, which is likely due to your high blood sugar. We will monitor your sodium levels as we manage your blood sugar.  -CARPAL TUNNEL SYNDROME: Carpal tunnel syndrome is a condition that causes pain and numbness in your hands. You received methylprednisolone for this, which may have contributed to the  onset of diabetes.  INSTRUCTIONS:  Please schedule a follow-up appointment in one month to monitor your progress and adjust your treatment as necessary.

## 2023-10-08 NOTE — Progress Notes (Signed)
 Assessment/Plan:   Assessment & Plan Newly diagnosed diabetes mellitus Significantly elevated blood glucose levels, initially 782 mg/dL, decreased to 409 mg/dL after fluids and insulin in the emergency department. Current level is 432 mg/dL. Hemoglobin A1c is 10.3%, indicating poor glycemic control over the past three months. Suspected medication-induced diabetes, potentially triggered by aripiprazole and methylprednisolone. Reports headache and return of appetite. Emphasized managing blood sugar levels to prevent complications. Discussed starting Mounjaro (tirzepatide) and metformin for better glycemic control. Mounjaro offers cardiovascular and renal protection, potential weight loss, and is suitable for any level of kidney disease. It is a weekly injection with potential gastrointestinal upset, manageable with dietary changes. Metformin may cause diarrhea but is generally well-tolerated. - Start metformin 500 mg twice daily. - Start Mounjaro (tirzepatide) 2.5 mg once weekly. - Refer to a diabetes educator for comprehensive diabetes management education. - Refer to a nutritionist for dietary and exercise guidance. - Refer to a pharmacy team for medication management and monitoring. - Provide a glucose meter for home blood sugar monitoring, with instructions to check fasting blood sugar upon waking. - Order C-peptide test to differentiate between types of diabetes. - Educate about potential side effects of Mounjaro (GI upset) and metformin (diarrhea).  Elevated kidney function tests Kidney function tests remain elevated despite fluids, possibly related to hyperglycemia. Plan to monitor kidney function closely. - Order repeat kidney function tests. - Order urine test to check for protein and glucose levels.  Hyponatremia Low sodium levels, likely secondary to hyperglycemia. Plan to monitor as blood sugar levels are managed. - Monitor sodium levels as part of the metabolic panel.  Carpal  tunnel syndrome Received methylprednisolone for bilateral carpal tunnel syndrome, which may have contributed to the onset of diabetes.  Follow-up Emphasized the importance of follow-up to monitor progress and adjust treatment as necessary. - Schedule follow-up appointment in one month.        Subjective:   Encounter date: 10/08/2023  Phillip Gill is a 48 y.o. male who has Anxiety; Other constipation; Depression; Fibromyalgia muscle pain; Anal condyloma; Gynecomastia; HTN (hypertension); Chronic hepatitis B (HCC); LGV (lymphogranuloma venereum); Primary syphilis; Colitis; HIV (human immunodeficiency virus infection) (HCC); Tobacco abuse; Overweight; Chronic pain associated with significant psychosocial dysfunction; Gonorrhea; Human immunodeficiency virus (HIV) disease (HCC); Foot lesion; Hepatitis B surface antigen positive; Healthcare maintenance; Erectile dysfunction; Cervical pain; Human monkeypox; Encounter for physical examination related to employment; Snoring; Urinary frequency; Left foot pain; Poor social situation; MDD (major depressive disorder); Passive suicidal ideations; OSA on CPAP; MDD (major depressive disorder), recurrent severe, without psychosis (HCC); Gastroesophageal reflux disease; Neck mass; Nocturia; Carpal tunnel syndrome on both sides; Hyponatremia; AKI (acute kidney injury) (HCC); and Type 2 diabetes mellitus with hyperglycemia (HCC) on their problem list..   He  has a past medical history of Anxiety, Arthritis, Chronic hepatitis B (HCC), Chronic lower back pain, Depression, Fibromyalgia, Genital warts, HIV disease (HCC) (02/28/2015), HIV infection (HCC), Hypertension, IBS (irritable bowel syndrome), Migraine, Overweight (07/20/2015), and Renal insufficiency (12/29/2013).Marland Kitchen   He presents with chief complaint of Follow-up (Patient states he feels fine now. Needs glucometer.) .   Discussed the use of AI scribe software for clinical note transcription with the patient,  who gave verbal consent to proceed.  History of Present Illness He presents with elevated blood sugar and changes in kidney function.  He is following up after an emergency department visit where he was found to have significantly elevated blood sugar levels, reaching approximately 782 mg/dL, which was reduced to 811 mg/dL  after receiving fluids and insulin. His current blood sugar level is 432 mg/dL. He experiences a headache and notes an improvement in appetite. His hemoglobin A1c is reported to be 10.3%, indicating poor blood sugar control over the past three months.  He has a recent history of starting aripiprazole and receiving steroids for bilateral carpal tunnel syndrome, which may have contributed to the onset of diabetes. He also received methylprednisolone for carpal tunnel syndrome.  No excessive thirst or urinary symptoms are noted. His kidney function remains elevated despite fluid administration.  He works night shifts, which may affect his routine for monitoring blood sugar levels.       Past Surgical History:  Procedure Laterality Date   CO2 LASER APPLICATION N/A 02/11/2013   Procedure: CO2 LASER APPLICATION;  Surgeon: Romie Levee, MD;  Location: [facility];  Service: General;  Laterality: N/A;   FOOT SURGERY     HIGH RESOLUTION ANOSCOPY N/A 02/11/2013   Procedure: HIGH RESOLUTION ANOSCOPY WITH BIOPSY, LASER ABLATION;  Surgeon: Romie Levee, MD;  Location: [facility];  Service: General;  Laterality: N/A;   WISDOM TOOTH EXTRACTION      Outpatient Medications Prior to Visit  Medication Sig Dispense Refill   ARIPiprazole (ABILIFY) 5 MG tablet Take 1 tablet (5 mg total) by mouth daily. 90 tablet 0   bictegravir-emtricitabine-tenofovir AF (BIKTARVY) 50-200-25 MG TABS tablet TAKE 1 TABLET BY MOUTH EVERY DAY 30 tablet 5   buPROPion (WELLBUTRIN XL) 150 MG 24 hr tablet Take 1 tablet (150 mg total) by mouth daily. 90 tablet 0   diclofenac  Sodium (VOLTAREN) 1 % GEL Apply 4 grams topically 4 (four) times daily to affected joint. 100 g 11   Elastic Bandages & Supports (CARPAL TUNNEL WRIST STABILIZER) MISC 1 each by Does not apply route as needed. 1 each 0   Elastic Bandages & Supports (MEDICAL COMPRESSION STOCKINGS) MISC 1 each by Does not apply route daily. 1 each 0   escitalopram (LEXAPRO) 20 MG tablet Take 1 tablet (20 mg total) by mouth daily. 90 tablet 0   esomeprazole (NEXIUM) 40 MG capsule Take 1 capsule (40 mg total) by mouth 2 (two) times daily before a meal. 180 capsule 3   famotidine (PEPCID) 40 MG tablet Take 1 tablet (40 mg total) by mouth at bedtime. 90 tablet 0   finasteride (PROSCAR) 5 MG tablet Take 1 tablet (5 mg total) by mouth daily. 30 tablet 1   furosemide (LASIX) 20 MG tablet Take 1 tablet (20 mg total) by mouth 2 (two) times daily as needed (swelling in legs). 180 tablet 3   methylPREDNISolone (MEDROL DOSEPAK) 4 MG TBPK tablet Take as directed 21 tablet 0   olmesartan-hydrochlorothiazide (BENICAR HCT) 40-25 MG tablet Take 1 tablet by mouth daily. 90 tablet 0   polyethylene glycol powder (GLYCOLAX/MIRALAX) 17 GM/SCOOP powder Dissolve 17 grams in liquid and drink by mouth every day. 476 g 0   rosuvastatin (CRESTOR) 10 MG tablet Take 1 tablet (10 mg total) by mouth daily. 30 tablet 2   tadalafil (CIALIS) 10 MG tablet Take 0.5 tablets (5 mg total) by mouth daily. 45 tablet 0   Vitamin D, Ergocalciferol, (DRISDOL) 1.25 MG (50000 UNIT) CAPS capsule Take 1 capsule (50,000 Units total) by mouth every 7 (seven) days. 8 capsule 0   metFORMIN (GLUCOPHAGE) 500 MG tablet Take 1 tablet (500 mg total) by mouth 2 (two) times daily with a meal. (Patient not taking: Reported on 10/08/2023) 60 tablet 0  No facility-administered medications prior to visit.    Family History  Problem Relation Age of Onset   Hypertension Mother    Diabetes Mother    Stroke Mother        cerbral aneurysm   Pancreatic cancer Mother    Colon  cancer Father    Hypertension Brother    Sleep apnea Cousin    Mental illness Neg Hx    Stomach cancer Neg Hx    Esophageal cancer Neg Hx     Social History   Socioeconomic History   Marital status: Single    Spouse name: Not on file   Number of children: Not on file   Years of education: Not on file   Highest education level: Not on file  Occupational History   Not on file  Tobacco Use   Smoking status: Former    Current packs/day: 0.00    Average packs/day: 1 pack/day for 18.0 years (18.0 ttl pk-yrs)    Types: Cigarettes    Start date: 07/03/2003    Quit date: 07/02/2021    Years since quitting: 2.2    Passive exposure: Never   Smokeless tobacco: Never   Tobacco comments:    Pt vapes everyday  Vaping Use   Vaping status: Former   Quit date: 12/01/2022   Substances: Nicotine  Substance and Sexual Activity   Alcohol use: Yes    Comment: occasional use   Drug use: No   Sexual activity: Not on file    Comment: accepted condoms  Other Topics Concern   Not on file  Social History Narrative   Grew up in [place], now living in [place],-  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    Finished HS.   Doing online classes with [employer]- studying accounting   Previously 2 years of classes at [employer].    Has Partner- [name]- together for 1 year.    Has 2 girls- (born in 2000).          Social Drivers of Corporate investment banker Strain: High Risk (02/05/2023)   Overall Financial Resource Strain (CARDIA)    Difficulty of Paying Living Expenses: Very hard  Food Insecurity: Food Insecurity Present (02/05/2023)   Hunger Vital Sign    Worried About Running Out of Food in the Last Year: Sometimes true    Ran Out of Food in the Last Year: Sometimes true  Transportation Needs: Unmet Transportation Needs (02/05/2023)   PRAPARE - Administrator, Civil Service (Medical): Yes    Lack of Transportation (Non-Medical): Yes  Physical Activity: Sufficiently  Active (07/16/2018)   Received from [facility], [facility]   Exercise Vital Sign    Days of Exercise per Week: 4 days    Minutes of Exercise per Session: 150+ min  Stress: Stress Concern Present (03/01/2023)   [employer] of Occupational Health - Occupational Stress Questionnaire    Feeling of Stress : To some extent  Social Connections: Unknown (05/23/2022)   [facility], [facility]   Social Network    Social Network: Not on file  Intimate Partner Violence: Unknown (05/23/2022)   Received from [facility], [facility]   HITS    Physically Hurt: Not on file    Insult or Talk Down To: Not on file    Threaten Physical Harm: Not on file    Scream or Curse: Not on file  Objective:  Physical Exam: BP 136/82   Pulse 66   Temp 97.8 F (36.6 C) (Temporal)   Wt 246 lb 6.4 oz (111.8 kg)   SpO2 99%   BMI 33.42 kg/m    Physical Exam GENERAL: Alert, cooperative, well developed, no acute distress HEENT: Normocephalic, normal oropharynx, moist mucous membranes CHEST: Clear to auscultation bilaterally, no wheezes, rhonchi, or crackles CARDIOVASCULAR: Normal heart rate and rhythm, S1 and S2 normal without murmurs ABDOMEN: Soft, non-tender, non-distended, without organomegaly, normal bowel sounds EXTREMITIES: No cyanosis or edema NEUROLOGICAL: Cranial nerves grossly intact, moves all extremities without gross motor or sensory deficit   Physical Exam Constitutional:      Appearance: Normal appearance.  HENT:     Head: Normocephalic and atraumatic.     Right Ear: Hearing normal.     Left Ear: Hearing normal.     Nose: Nose normal.  Eyes:     General: No scleral icterus.       Right eye: No discharge.        Left eye: No discharge.     Extraocular Movements: Extraocular movements intact.  Cardiovascular:     Rate and  Rhythm: Normal rate and regular rhythm.     Heart sounds: Normal heart sounds.  Pulmonary:     Effort: Pulmonary effort is normal.     Breath sounds: Normal breath sounds.  Abdominal:     Palpations: Abdomen is soft.     Tenderness: There is no abdominal tenderness.  Musculoskeletal:     Right lower leg: 2+ Pitting Edema present.     Left lower leg: 2+ Pitting Edema present.  Skin:    General: Skin is warm.     Findings: No rash.  Neurological:     General: No focal deficit present.     Mental Status: He is alert.     Cranial Nerves: No cranial nerve deficit.  Psychiatric:        Thought Content: Thought content includes suicidal ideation. Thought content does not include homicidal ideation. Thought content does not include suicidal plan.     DG INJECT DIAG/THERA/INC NEEDLE/CATH/PLC EPI/LUMB/SAC W/IMG Result Date: 07/12/2023 CLINICAL DATA:  Lumbosacral spondylosis without myelopathy. 90 % symptomatic relief after previous injection, without side effect or complication. Partial recurrence of symptoms. The patient wishes to repeat. EXAM: LUMBAR EPIDURAL INJECTION: DIAGNOSTIC EPIDURAL INJECTION: THERAPEUTIC EPIDURAL INJECTION: PROCEDURE: The procedure, risks, benefits, and alternatives were explained to the patient. Questions regarding the procedure were encouraged and answered. The patient understands and consents to the procedure. An interlaminar approach was performed on left at L5-S1. The overlying skin was cleansed and anesthetized. A 20 gauge epidural needle was advanced using loss-of-resistance technique. Injection of Isovue-M 200 shows a good epidural pattern with spread above and below the level of needle placement, primarily on the left. No vascular opacification is seen. 80mg  of Depo-Medrol mixed with 2ml lidocaine 1% were instilled. The procedure was well-tolerated, and the patient was discharged thirty minutes following the injection in good condition. FLUOROSCOPY: Radiation  Exposure Index (as provided by the fluoroscopic device): 3.4 mGy air Kerma COMPLICATIONS: None immediate IMPRESSION: Technically successful epidural injection on the left at L5-S1. Electronically Signed   By: Corlis Leak M.D.   On: 07/12/2023 10:46    Recent Results (from the past 2160 hours)  Urine cytology ancillary only     Status: None   Collection Time: 08/01/23 10:02 AM  Result Value Ref Range   Neisseria Gonorrhea Negative    Chlamydia  Negative    Comment Normal Reference Ranger Chlamydia - Negative    Comment      Normal Reference Range Neisseria Gonorrhea - Negative  T-helper cells (CD4) count (not at [facility])     Status: None   Collection Time: 08/01/23 10:05 AM  Result Value Ref Range   CD4 T Helper % 35 30 - 61 %   Absolute CD4 1,023 490 - 1,740 cells/uL   Total lymphocyte count 2,965 850 - 3,900 cells/uL    Comment: The analytical performance characteristics of this assay  have been determined by [employer]. The  modifications have not been cleared or approved by the  FDA. This assay has been validated pursuant to the CLIA  regulations and is used for clinical purposes.   Hepatitis B surface antibody,quantitative     Status: Abnormal   Collection Time: 08/01/23 10:05 AM  Result Value Ref Range   Hep B S AB Quant (Post) 6 (L) > OR = 10 mIU/mL    Comment: . Patient does not have immunity to hepatitis B virus. . For additional information, please refer to http://education.[facility].com/faq/FAQ105 (This link is being provided for informational/ educational purposes only).   Hepatitis B surface antigen     Status: None   Collection Time: 08/01/23 10:05 AM  Result Value Ref Range   Hepatitis B Surface Ag NON-REACTIVE NON-REACTIVE    Comment: . For additional information, please refer to  http://education.[facility].com/faq/FAQ202  (This link is being provided for informational/ educational purposes only.) .   Hepatitis B DNA, ultraquantitative,  PCR     Status: None   Collection Time: 08/01/23 10:05 AM  Result Value Ref Range   Hepatitis B DNA Not Detected IU/mL   Hepatitis B virus DNA Not Detected Log IU/mL    Comment: . Reference Range:                          Not Detected       IU/mL                          Not Detected   Log IU/mL . The analytical performance characteristics of this assay have been determined by [employer]. The modifications have not been cleared or approved by the U.S. Food and Drug Administration. This assay has been validated pursuant to the CLIA regulations and is used for clinical purposes. .   RPR     Status: None   Collection Time: 08/01/23 10:05 AM  Result Value Ref Range   RPR Ser Ql NON-REACTIVE NON-REACTIVE    Comment: . No laboratory evidence of syphilis. If recent exposure is suspected, submit a new sample in 2-4 weeks. .   Lipid panel     Status: Abnormal   Collection Time: 08/01/23 10:05 AM  Result Value Ref Range   Cholesterol 199 <200 mg/dL   HDL 42 > OR = 40 mg/dL   Triglycerides 191 (H) <150 mg/dL   LDL Cholesterol (Calc) 130 (H) mg/dL (calc)    Comment: Reference range: <100 . Desirable range <100 mg/dL for primary prevention;   <70 mg/dL for patients with CHD or diabetic patients  with > or = 2 CHD risk factors. Marland Kitchen LDL-C is now calculated using the Martin-Hopkins  calculation, which is a validated novel method providing  better accuracy than the Friedewald equation in the  estimation of LDL-C.  Horald Pollen et al. Lenox Ahr. 4782;956(21): 2061-2068  (http://education.[facility].com/faq/FAQ164)  Total CHOL/HDL Ratio 4.7 <5.0 (calc)   Non-HDL Cholesterol (Calc) 157 (H) <130 mg/dL (calc)    Comment: For patients with diabetes plus 1 major ASCVD risk  factor, treating to a non-HDL-C goal of <100 mg/dL  (LDL-C of <16 mg/dL) is considered a therapeutic  option.   CBC with Differential     Status: Abnormal   Collection Time: 09/16/23  8:09 AM  Result Value Ref  Range   WBC 10.6 (H) 4.0 - 10.5 K/uL   RBC 4.79 4.22 - 5.81 MIL/uL   Hemoglobin 13.9 13.0 - 17.0 g/dL   HCT 10.9 60.4 - 54.0 %   MCV 89.4 80.0 - 100.0 fL   MCH 29.0 26.0 - 34.0 pg   MCHC 32.5 30.0 - 36.0 g/dL   RDW 98.1 19.1 - 47.8 %   Platelets 254 150 - 400 K/uL   nRBC 0.0 0.0 - 0.2 %   Neutrophils Relative % 55 %   Neutro Abs 5.9 1.7 - 7.7 K/uL   Lymphocytes Relative 34 %   Lymphs Abs 3.6 0.7 - 4.0 K/uL   Monocytes Relative 9 %   Monocytes Absolute 1.0 0.1 - 1.0 K/uL   Eosinophils Relative 1 %   Eosinophils Absolute 0.1 0.0 - 0.5 K/uL   Basophils Relative 0 %   Basophils Absolute 0.0 0.0 - 0.1 K/uL   Immature Granulocytes 1 %   Abs Immature Granulocytes 0.05 0.00 - 0.07 K/uL    Comment: Performed at [facility], [address] [place], [place] [address]  Comprehensive metabolic panel     Status: Abnormal   Collection Time: 09/16/23  8:09 AM  Result Value Ref Range   Sodium 138 135 - 145 mmol/L   Potassium 3.8 3.5 - 5.1 mmol/L   Chloride 104 98 - 111 mmol/L   CO2 30 22 - 32 mmol/L   Glucose, Bld 131 (H) 70 - 99 mg/dL    Comment: Glucose reference range applies only to samples taken after fasting for at least 8 hours.   BUN 10 6 - 20 mg/dL   Creatinine, Ser 1.30 0.61 - 1.24 mg/dL   Calcium 9.4 8.9 - 86.5 mg/dL   Total Protein 8.2 (H) 6.5 - 8.1 g/dL   Albumin 4.5 3.5 - 5.0 g/dL   AST 21 15 - 41 U/L   ALT 19 0 - 44 U/L   Alkaline Phosphatase 84 38 - 126 U/L   Total Bilirubin 0.5 0.0 - 1.2 mg/dL   GFR, Estimated >78 >46 mL/min    Comment: (NOTE) Calculated using the CKD-EPI Creatinine Equation (2021)    Anion gap 4 (L) 5 - 15    Comment: Performed at [facility], [address] [place], [place] [address]  B Nat Peptide     Status: None   Collection Time: 09/19/23  9:46 AM  Result Value Ref Range   Pro B Natriuretic peptide (BNP) 18.0 0.0 - 100.0 pg/mL  Thyroid Panel With TSH     Status: None   Collection Time: 09/19/23   9:46 AM  Result Value Ref Range   T3 Uptake 27 22 - 35 %   T4, Total 6.0 4.9 - 10.5 mcg/dL   Free Thyroxine Index 1.6 1.4 - 3.8   TSH 1.39 0.40 - 4.50 mIU/L  Urinalysis w microscopic + reflex cultur     Status: None   Collection Time: 09/19/23  9:46 AM   Specimen: Blood  Result Value Ref Range   Color, Urine YELLOW  YELLOW   APPearance CLEAR CLEAR   Specific Gravity, Urine 1.008 1.001 - 1.035   pH 5.5 5.0 - 8.0   Glucose, UA NEGATIVE NEGATIVE   Bilirubin Urine NEGATIVE NEGATIVE   Ketones, ur NEGATIVE NEGATIVE   Hgb urine dipstick NEGATIVE NEGATIVE   Protein, ur NEGATIVE NEGATIVE   Nitrites, Initial NEGATIVE NEGATIVE   Leukocyte Esterase NEGATIVE NEGATIVE   WBC, UA NONE SEEN 0 - 5 /HPF   RBC / HPF NONE SEEN 0 - 2 /HPF   Squamous Epithelial / HPF NONE SEEN < OR = 5 /HPF   Bacteria, UA NONE SEEN NONE SEEN /HPF   Hyaline Cast NONE SEEN NONE SEEN /LPF   Note      Comment: This urine was analyzed for the presence of WBC,  RBC, bacteria, casts, and other formed elements.  Only those elements seen were reported. Marland Kitchen Donnella Bi / creatinine urine ratio     Status: None   Collection Time: 09/19/23  9:46 AM  Result Value Ref Range   Microalb, Ur <0.7 mg/dL   Creatinine,U 16.1 mg/dL   Microalb Creat Ratio Unable to calculate 0.0 - 30.0 mg/g    Comment: Unable to Calculate due to Microalbumin Result of <0.7 mg/dL  REFLEXIVE URINE CULTURE     Status: None   Collection Time: 09/19/23  9:46 AM  Result Value Ref Range   Reflexve Urine Culture      Comment: NO CULTURE INDICATED  Basic Metabolic Panel (BMET)     Status: Abnormal   Collection Time: 10/07/23  8:08 AM  Result Value Ref Range   Sodium 127 (L) 135 - 145 mEq/L   Potassium 4.2 3.5 - 5.1 mEq/L   Chloride 87 (L) 96 - 112 mEq/L   CO2 28 19 - 32 mEq/L   Glucose, Bld 782 (HH) 70 - 99 mg/dL   BUN 20 6 - 23 mg/dL   Creatinine, Ser 0.96 (H) 0.40 - 1.50 mg/dL   GFR 04.54 (L) >09.81 mL/min    Comment: Calculated using the  CKD-EPI Creatinine Equation (2021)   Calcium 9.2 8.4 - 10.5 mg/dL  Urinalysis, Routine w reflex microscopic -Urine, Clean Catch     Status: Abnormal   Collection Time: 10/07/23  6:10 PM  Result Value Ref Range   Color, Urine YELLOW YELLOW   APPearance CLEAR CLEAR   Specific Gravity, Urine 1.010 1.005 - 1.030   pH 5.5 5.0 - 8.0   Glucose, UA >=500 (A) NEGATIVE mg/dL   Hgb urine dipstick NEGATIVE NEGATIVE   Bilirubin Urine NEGATIVE NEGATIVE   Ketones, ur NEGATIVE NEGATIVE mg/dL   Protein, ur NEGATIVE NEGATIVE mg/dL   Nitrite NEGATIVE NEGATIVE   Leukocytes,Ua NEGATIVE NEGATIVE    Comment: Performed at [facility], [address] [place], [place] [address]  CBG monitoring, ED     Status: Abnormal   Collection Time: 10/07/23  6:10 PM  Result Value Ref Range   Glucose-Capillary >600 (HH) 70 - 99 mg/dL    Comment: Glucose reference range applies only to samples taken after fasting for at least 8 hours.  Urinalysis, Microscopic (reflex)     Status: None   Collection Time: 10/07/23  6:10 PM  Result Value Ref Range   RBC / HPF 0-5 0 - 5 RBC/hpf   WBC, UA 0-5 0 - 5 WBC/hpf   Bacteria, UA NONE SEEN NONE SEEN   Squamous Epithelial / HPF NONE SEEN 0 - 5 /HPF    Comment:  Performed at [facility], [address] [place], [place] [address]  Basic metabolic panel     Status: Abnormal   Collection Time: 10/07/23  6:18 PM  Result Value Ref Range   Sodium 127 (L) 135 - 145 mmol/L   Potassium 4.0 3.5 - 5.1 mmol/L   Chloride 88 (L) 98 - 111 mmol/L   CO2 26 22 - 32 mmol/L   Glucose, Bld 707 (HH) 70 - 99 mg/dL    Comment: RESULT CALLED TO, READ BACK BY AND VERIFIED WITH SOPHIA GOUGE @ 1912 10/07/2023 PERRY, J. Glucose reference range applies only to samples taken after fasting for at least 8 hours.    BUN 22 (H) 6 - 20 mg/dL   Creatinine, Ser 6.04 (H) 0.61 - 1.24 mg/dL   Calcium 8.7 (L) 8.9 - 10.3 mg/dL   GFR, Estimated 52 (L) >60 mL/min    Comment: (NOTE) Calculated  using the CKD-EPI Creatinine Equation (2021)    Anion gap 13 5 - 15    Comment: Performed at [facility], [address] [place], [place] [address]  CBC     Status: Abnormal   Collection Time: 10/07/23  6:18 PM  Result Value Ref Range   WBC 12.8 (H) 4.0 - 10.5 K/uL   RBC 4.73 4.22 - 5.81 MIL/uL   Hemoglobin 13.7 13.0 - 17.0 g/dL   HCT 11.9 14.7 - 82.9 %   MCV 83.1 80.0 - 100.0 fL   MCH 29.0 26.0 - 34.0 pg   MCHC 34.9 30.0 - 36.0 g/dL   RDW 56.2 13.0 - 86.5 %   Platelets 230 150 - 400 K/uL   nRBC 0.0 0.0 - 0.2 %    Comment: Performed at [facility], [address] [place], [place] [address]  I-Stat venous blood gas, ([facility], [facility], [facility])     Status: Abnormal   Collection Time: 10/07/23  6:20 PM  Result Value Ref Range   pH, Ven 7.413 7.25 - 7.43   pCO2, Ven 43.9 (L) 44 - 60 mmHg   pO2, Ven 60 (H) 32 - 45 mmHg   Bicarbonate 28.0 20.0 - 28.0 mmol/L   TCO2 29 22 - 32 mmol/L   O2 Saturation 91 %   Acid-Base Excess 3.0 (H) 0.0 - 2.0 mmol/L   Sodium 130 (L) 135 - 145 mmol/L   Potassium 4.2 3.5 - 5.1 mmol/L   Calcium, Ion 1.09 (L) 1.15 - 1.40 mmol/L   HCT 43.0 39.0 - 52.0 %   Hemoglobin 14.6 13.0 - 17.0 g/dL   Collection site IV start    Drawn by Nurse    Sample type VENOUS   Beta-hydroxybutyric acid     Status: None   Collection Time: 10/07/23  7:30 PM  Result Value Ref Range   Beta-Hydroxybutyric Acid 0.11 0.05 - 0.27 mmol/L    Comment: Performed at [facility], [address] [place], [place] [address]  CBG monitoring, ED     Status: Abnormal   Collection Time: 10/07/23  8:32 PM  Result Value Ref Range   Glucose-Capillary >600 (HH) 70 - 99 mg/dL    Comment: Glucose reference range applies only to samples taken after fasting for at least 8 hours.  CBG monitoring, ED     Status: Abnormal   Collection Time: 10/07/23  9:26 PM  Result Value Ref Range   Glucose-Capillary 559 (HH) 70 - 99 mg/dL    Comment: Glucose reference  range applies only to  samples taken after fasting for at least 8 hours.   Comment 1 Notify RN   CBG monitoring, ED     Status: Abnormal   Collection Time: 10/07/23  9:59 PM  Result Value Ref Range   Glucose-Capillary 523 (HH) 70 - 99 mg/dL    Comment: Glucose reference range applies only to samples taken after fasting for at least 8 hours.   Comment 1 Notify RN   CBG monitoring, ED     Status: Abnormal   Collection Time: 10/07/23 10:28 PM  Result Value Ref Range   Glucose-Capillary 456 (H) 70 - 99 mg/dL    Comment: Glucose reference range applies only to samples taken after fasting for at least 8 hours.  CBG monitoring, ED     Status: Abnormal   Collection Time: 10/07/23 10:59 PM  Result Value Ref Range   Glucose-Capillary 369 (H) 70 - 99 mg/dL    Comment: Glucose reference range applies only to samples taken after fasting for at least 8 hours.   Comment 1 Notify RN   POCT glucose (manual entry)     Status: Abnormal   Collection Time: 10/08/23 11:07 AM  Result Value Ref Range   POC Glucose 432 (A) 70 - 99 mg/dl  POCT glycosylated hemoglobin (Hb A1C)     Status: Abnormal   Collection Time: 10/08/23 11:09 AM  Result Value Ref Range   Hemoglobin A1C 10.3 (A) 4.0 - 5.6 %   HbA1c POC (<> result, manual entry)     HbA1c, POC (prediabetic range)     HbA1c, POC (controlled diabetic range)          Garner Nash, MD, MS

## 2023-10-08 NOTE — Transitions of Care (Post Inpatient/ED Visit) (Signed)
   10/08/2023  Name: Phillip Gill MRN: [id number] DOB: 1975/10/11  Today's TOC FU Call Status: Today's TOC FU Call Status:: Successful TOC FU Call Completed TOC FU Call Complete Date: 10/08/23 Patient's Name and Date of Birth confirmed.  Transition Care Management Follow-up Telephone Call Discharge Facility: [facility] Type of Discharge: Emergency Department How have you been since you were released from the hospital?: Better Any questions or concerns?: No  Items Reviewed: Did you receive and understand the discharge instructions provided?: Yes Medications obtained,verified, and reconciled?: Yes (Medications Reviewed) Any new allergies since your discharge?: No Dietary orders reviewed?: NA Do you have support at home?: Yes  Medications Reviewed Today: Medications Reviewed Today   Medications were not reviewed in this encounter     Home Care and Equipment/Supplies: Were Home Health Services Ordered?: NA Any new equipment or medical supplies ordered?: No  Functional Questionnaire: Do you need assistance with bathing/showering or dressing?: No Do you need assistance with meal preparation?: No Do you need assistance with eating?: No Do you have difficulty maintaining continence: No Do you need assistance with getting out of bed/getting out of a chair/moving?: No Do you have difficulty managing or taking your medications?: No  Follow up appointments reviewed: PCP Follow-up appointment confirmed?: Yes Date of PCP follow-up appointment?: 10/08/23 Follow-up Provider: Dr. [name] Specialist [facility] Follow-up appointment confirmed?: No Do you understand care options if your condition(s) worsen?: Yes-patient verbalized understanding

## 2023-10-09 ENCOUNTER — Other Ambulatory Visit (HOSPITAL_COMMUNITY): Payer: Self-pay

## 2023-10-09 ENCOUNTER — Other Ambulatory Visit: Payer: Self-pay

## 2023-10-09 LAB — URINALYSIS W MICROSCOPIC + REFLEX CULTURE
Bacteria, UA: NONE SEEN /HPF
Bilirubin Urine: NEGATIVE
Hgb urine dipstick: NEGATIVE
Hyaline Cast: NONE SEEN /LPF
Ketones, ur: NEGATIVE
Leukocyte Esterase: NEGATIVE
Nitrites, Initial: NEGATIVE
RBC / HPF: NONE SEEN /HPF (ref 0–2)
Specific Gravity, Urine: 1.036 — ABNORMAL HIGH (ref 1.001–1.035)
Squamous Epithelial / HPF: NONE SEEN /HPF (ref <=5)
WBC, UA: NONE SEEN /HPF (ref 0–5)
pH: 6 (ref 5.0–8.0)

## 2023-10-09 LAB — NO CULTURE INDICATED

## 2023-10-09 LAB — C-PEPTIDE: C-Peptide: 2.87 ng/mL (ref 0.80–3.85)

## 2023-10-10 ENCOUNTER — Ambulatory Visit: Payer: MEDICAID | Admitting: Family Medicine

## 2023-10-10 ENCOUNTER — Other Ambulatory Visit: Payer: Self-pay | Admitting: Family Medicine

## 2023-10-10 ENCOUNTER — Ambulatory Visit: Payer: MEDICAID | Admitting: Adult Health

## 2023-10-10 ENCOUNTER — Ambulatory Visit (INDEPENDENT_AMBULATORY_CARE_PROVIDER_SITE_OTHER): Payer: MEDICAID | Admitting: Licensed Clinical Social Worker

## 2023-10-10 DIAGNOSIS — E1129 Type 2 diabetes mellitus with other diabetic kidney complication: Secondary | ICD-10-CM | POA: Insufficient documentation

## 2023-10-10 DIAGNOSIS — F332 Major depressive disorder, recurrent severe without psychotic features: Secondary | ICD-10-CM

## 2023-10-10 DIAGNOSIS — F411 Generalized anxiety disorder: Secondary | ICD-10-CM

## 2023-10-10 NOTE — Progress Notes (Signed)
 THERAPIST PROGRESS NOTE   Session Date: 10/10/2023  Session Time: 1017 - 1057  Participation Level: Active  Behavioral Response: CasualAlertDysphoric  Type of Therapy: Individual Therapy  Treatment Goals addressed:  - LTG: Reduce frequency, intensity, and duration of depression symptoms so that daily functioning is improved (OP Depression) - LTG: Increase coping skills to manage depression and improve ability to perform daily activities (OP Depression) - LTG: "To not be depressed, have more energy, and stop over eating" (OP Depression) - STG: Report a decrease in anxiety symptoms as evidenced by an overall reduction in anxiety score by a minimum of 25% on the Generalized Anxiety Disorder Scale (GAD-7) (Anxiety) - STG: Izaac "Shan" will reduce frequency of avoidant behaviors by 50% as evidenced by self-report in therapy sessions (Anxiety) - LTG: "Be a more people person" Increase frequency at which pt is actively engaging in social activities. (Anxiety)  ProgressTowards Goals: Not Progressing  Interventions: CBT, Solution Focused, and Supportive  Summary: [name] is a 48 y.o. male with past psych history of MDD and GAD, presenting for follow up therapy session in efforts to improve management of depressive and anxious symptoms.  Patient actively engaged in session, presenting in dysphoric moods, with congruent affect throughout, with brief instances of brightening moods.  Patient actively engaged in providing recounts of past 2 weeks since prior visit, detailing reports of presenting to PCP on Monday of this week learning of being type II diabetic reporting of sugar being at 732, further detailing of PCP recommending patient's present to the ED for urgent management of symptoms.  Patient further engaged in reflection of experience thoughts and feelings surrounding new diagnosis, acknowledging of increased concerns surrounding medical health and needs to address presenting medical  complications.  Patient attributes increase in blood sugar levels being most likely related to consuming 2 energy drinks nightly while working.  Actively engaged further in processing of relationship between diet and overall health, speaking to both physical health and mental health complications that proved to be exacerbated by poor diet to include processed foods, fried foods, and sugary drinks.  Patient expressed intent to navigate nutrition/dietary concerns with diabetic doctor and efforts to begin implementing more healthy approaches.  Patient responded well to interventions. Patient continues to meet criteria for MDD and GAD. Patient will continue to benefit from engagement in outpatient therapy due to being the least restrictive service to meet presenting needs.      10/03/2023    9:09 AM 09/24/2023   10:19 AM 09/09/2023   10:13 AM 08/08/2023    4:12 PM  GAD 7 : Generalized Anxiety Score  Nervous, Anxious, on Edge 1 3 3 2   Control/stop worrying 3 3 3 2   Worry too much - different things 3 0 3 3  Trouble relaxing 3 3 1 2   Restless 2 1 1 2   Easily annoyed or irritable 3 3 3 3   Afraid - awful might happen 3 1 3 3   Total GAD 7 Score 18 14 17 17   Anxiety Difficulty Extremely difficult Extremely difficult Somewhat difficult Somewhat difficult      10/03/2023    9:09 AM 09/24/2023   10:23 AM 09/09/2023   10:16 AM 08/08/2023    4:23 PM 08/01/2023    9:36 AM  Depression screen PHQ 2/9  Decreased Interest 3 3 3 3 1   Down, Depressed, Hopeless 3 3 3 3 1   PHQ - 2 Score 6 6 6 6 2   Altered sleeping 2 3 3 3 1   Tired, decreased  energy 3 3 3 3 1   Change in appetite 3 3 1 3 1   Feeling bad or failure about yourself  3 3 3 3 1   Trouble concentrating 3 3 3 3 1   Moving slowly or fidgety/restless 3 3 3 3 1   Suicidal thoughts 3 1 1  0 0  PHQ-9 Score 26 25 23 24 8   Difficult doing work/chores Extremely dIfficult Extremely dIfficult Extremely dIfficult Somewhat difficult Extremely dIfficult   Flowsheet Row  ED from 10/07/2023 in [facility] Emergency Department at [facility] ED from 09[facility]16/2023 in [facility] Emergency Department at [facility] Counselor from 06/20/2023 in [facility] at [facility] RISK CATEGORY No Risk No Risk Low Risk       Suicidal/Homicidal: Yes. pSI, "feeling wouldn't be in anyone's way if I wasn't here". Pt detailed thoughts have been related to living situation with aunts.  Therapist Response: Clinician utilized CBT, solution focused, and supportive reflection interventions to support pt in navigation presenting sxs.   Clinician actively greeted patient, engaging in introductory check-in, assessing presenting mood and affect, further prompting patient recounts of challenges and factors contributing to presenting moods.  Actively listened to the patient's reports of recent challenges surrounding physical health complications and increased concerns, utilizing open-ended questions to support patient in processing identified stressors and challenges, validating shared thoughts and feelings.  Utilized Socratic questioning to further encourage critical thinking of identified thoughts and feelings surrounding presenting stressors, utilizing MI and psychoeducational interventions to aid patient in processing potential solutions and/or means in navigating presenting stressors.  Clinician reassessed presentation of sxs and presence of safety concerns. Clinician provided support and empathy to patient during session.  Plan: Return again in 2 weeks.  Diagnosis:  Encounter Diagnoses  Name Primary?   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  Patient/Guardian was advised Release of Information must be obtained prior to any record release in order to collaborate their care with an outside provider.  Patient/Guardian was advised if they have not already done so to contact the registration department to sign all necessary forms in order for [place] to release information regarding their care.   Consent: Patient/Guardian gives verbal consent for treatment and assignment of benefits for services provided during this visit. Patient/Guardian expressed understanding and agreed to proceed.   Leisa Lenz, MSW, LCSW 10/10/2023,  10:29 AM

## 2023-10-11 ENCOUNTER — Encounter (HOSPITAL_COMMUNITY): Payer: Self-pay | Admitting: *Deleted

## 2023-10-11 ENCOUNTER — Other Ambulatory Visit: Payer: Self-pay

## 2023-10-11 ENCOUNTER — Other Ambulatory Visit (HOSPITAL_COMMUNITY): Payer: Self-pay

## 2023-10-11 ENCOUNTER — Inpatient Hospital Stay (HOSPITAL_COMMUNITY)
Admission: EM | Admit: 2023-10-11 | Discharge: 2023-10-14 | DRG: 637 | Disposition: A | Discharge status: Home / Self Care | Payer: MEDICAID | Attending: Family Medicine | Admitting: Family Medicine

## 2023-10-11 DIAGNOSIS — E785 Hyperlipidemia, unspecified: Secondary | ICD-10-CM | POA: Diagnosis present

## 2023-10-11 DIAGNOSIS — F419 Anxiety disorder, unspecified: Secondary | ICD-10-CM | POA: Diagnosis present

## 2023-10-11 DIAGNOSIS — E869 Volume depletion, unspecified: Secondary | ICD-10-CM | POA: Diagnosis present

## 2023-10-11 DIAGNOSIS — E1165 Type 2 diabetes mellitus with hyperglycemia: Secondary | ICD-10-CM | POA: Diagnosis present

## 2023-10-11 DIAGNOSIS — F32A Depression, unspecified: Secondary | ICD-10-CM | POA: Diagnosis present

## 2023-10-11 DIAGNOSIS — Z7984 Long term (current) use of oral hypoglycemic drugs: Secondary | ICD-10-CM

## 2023-10-11 DIAGNOSIS — B181 Chronic viral hepatitis B without delta-agent: Secondary | ICD-10-CM | POA: Diagnosis present

## 2023-10-11 DIAGNOSIS — Z8249 Family history of ischemic heart disease and other diseases of the circulatory system: Secondary | ICD-10-CM

## 2023-10-11 DIAGNOSIS — Z87891 Personal history of nicotine dependence: Secondary | ICD-10-CM

## 2023-10-11 DIAGNOSIS — Z6833 Body mass index (BMI) 33.0-33.9, adult: Secondary | ICD-10-CM

## 2023-10-11 DIAGNOSIS — M797 Fibromyalgia: Secondary | ICD-10-CM | POA: Diagnosis present

## 2023-10-11 DIAGNOSIS — Z7985 Long-term (current) use of injectable non-insulin antidiabetic drugs: Secondary | ICD-10-CM

## 2023-10-11 DIAGNOSIS — N170 Acute kidney failure with tubular necrosis: Secondary | ICD-10-CM | POA: Diagnosis present

## 2023-10-11 DIAGNOSIS — Z823 Family history of stroke: Secondary | ICD-10-CM

## 2023-10-11 DIAGNOSIS — E669 Obesity, unspecified: Secondary | ICD-10-CM | POA: Diagnosis present

## 2023-10-11 DIAGNOSIS — F319 Bipolar disorder, unspecified: Secondary | ICD-10-CM | POA: Diagnosis present

## 2023-10-11 DIAGNOSIS — R739 Hyperglycemia, unspecified: Principal | ICD-10-CM

## 2023-10-11 DIAGNOSIS — Z21 Asymptomatic human immunodeficiency virus [HIV] infection status: Secondary | ICD-10-CM | POA: Diagnosis present

## 2023-10-11 DIAGNOSIS — E871 Hypo-osmolality and hyponatremia: Secondary | ICD-10-CM | POA: Diagnosis present

## 2023-10-11 DIAGNOSIS — N179 Acute kidney failure, unspecified: Secondary | ICD-10-CM | POA: Diagnosis present

## 2023-10-11 DIAGNOSIS — B20 Human immunodeficiency virus [HIV] disease: Secondary | ICD-10-CM | POA: Diagnosis present

## 2023-10-11 DIAGNOSIS — E1100 Type 2 diabetes mellitus with hyperosmolarity without nonketotic hyperglycemic-hyperosmolar coma (NKHHC): Principal | ICD-10-CM | POA: Diagnosis present

## 2023-10-11 DIAGNOSIS — I10 Essential (primary) hypertension: Secondary | ICD-10-CM | POA: Diagnosis present

## 2023-10-11 DIAGNOSIS — D72829 Elevated white blood cell count, unspecified: Secondary | ICD-10-CM | POA: Diagnosis present

## 2023-10-11 DIAGNOSIS — G4733 Obstructive sleep apnea (adult) (pediatric): Secondary | ICD-10-CM | POA: Diagnosis present

## 2023-10-11 DIAGNOSIS — Z79899 Other long term (current) drug therapy: Secondary | ICD-10-CM

## 2023-10-11 LAB — URINALYSIS, ROUTINE W REFLEX MICROSCOPIC
Bacteria, UA: NONE SEEN
Bilirubin Urine: NEGATIVE
Glucose, UA: >=500 mg/dL — AB
Hgb urine dipstick: NEGATIVE
Ketones, ur: NEGATIVE mg/dL
Leukocytes,Ua: NEGATIVE
Nitrite: NEGATIVE
Protein, ur: NEGATIVE mg/dL
Specific Gravity, Urine: 1.014 (ref 1.005–1.030)
pH: 5 (ref 5.0–8.0)

## 2023-10-11 LAB — CBC
HCT: 44.4 % (ref 39.0–52.0)
Hemoglobin: 14.9 g/dL (ref 13.0–17.0)
MCH: 28.5 pg (ref 26.0–34.0)
MCHC: 33.6 g/dL (ref 30.0–36.0)
MCV: 84.9 fL (ref 80.0–100.0)
Platelets: 263 10*3/uL (ref 150–400)
RBC: 5.23 MIL/uL (ref 4.22–5.81)
RDW: 13 % (ref 11.5–15.5)
WBC: 14.7 10*3/uL — ABNORMAL HIGH (ref 4.0–10.5)
nRBC: 0 % (ref 0.0–0.2)

## 2023-10-11 LAB — COMPREHENSIVE METABOLIC PANEL WITH GFR
ALT: 20 U/L (ref 0–44)
AST: 20 U/L (ref 15–41)
Albumin: 4.2 g/dL (ref 3.5–5.0)
Alkaline Phosphatase: 97 U/L (ref 38–126)
Anion gap: 15 (ref 5–15)
BUN: 30 mg/dL — ABNORMAL HIGH (ref 6–20)
CO2: 25 mmol/L (ref 22–32)
Calcium: 9.4 mg/dL (ref 8.9–10.3)
Chloride: 91 mmol/L — ABNORMAL LOW (ref 98–111)
Creatinine, Ser: 1.92 mg/dL — ABNORMAL HIGH (ref 0.61–1.24)
GFR, Estimated: 43 mL/min — ABNORMAL LOW (ref >60)
Glucose, Bld: 450 mg/dL — ABNORMAL HIGH (ref 70–99)
Potassium: 3.6 mmol/L (ref 3.5–5.1)
Sodium: 131 mmol/L — ABNORMAL LOW (ref 135–145)
Total Bilirubin: 1 mg/dL (ref 0.0–1.2)
Total Protein: 8.7 g/dL — ABNORMAL HIGH (ref 6.5–8.1)

## 2023-10-11 LAB — LIPASE, BLOOD: Lipase: 40 U/L (ref 11–51)

## 2023-10-11 LAB — CBG MONITORING, ED: Glucose-Capillary: 479 mg/dL — ABNORMAL HIGH (ref 70–99)

## 2023-10-11 MED ORDER — ONDANSETRON HCL 4 MG/2ML IJ SOLN
4.0000 mg | Freq: Once | INTRAMUSCULAR | Status: AC
  Administered 2023-10-11: 4 mg via INTRAVENOUS
  Filled 2023-10-11: qty 2

## 2023-10-11 MED ORDER — SODIUM CHLORIDE 0.9 % IV BOLUS
1000.0000 mL | Freq: Once | INTRAVENOUS | Status: AC
  Administered 2023-10-11: 1000 mL via INTRAVENOUS

## 2023-10-11 NOTE — ED Provider Notes (Addendum)
 [place] EMERGENCY DEPARTMENT AT [facility] Provider Note   CSN: [id number] Arrival date & time: 10/11/23  1646     History  Chief Complaint  Patient presents with   Hyperglycemia    Phillip Gill is a 48 y.o. male with past medical history of fibromyalgia, HTN, hep B, HIV, tobacco use, HIV, OSA, T2DM presents emergency department for evaluation of nausea, decreased appetite following being placed on Metformin 5 days ago.  Also endorses some polyuria, polydipsia. No dysuria, burning with urination, nor fevers  He was evaluated at [facility] ED on 10/07/2023 for other complaints and noted to have CBG of 782. He was discharged with metformin. He then followed up with PCP who recommended metformin, Mounjaro, diabetes counselor, nutritionist, and 1 month follow up.   Hyperglycemia Associated symptoms: no abdominal pain, no chest pain, no dizziness, no fatigue, no fever, no nausea, no shortness of breath, no vomiting and no weakness        Home Medications Prior to Admission medications   Medication Sig Start Date End Date Taking? Authorizing Provider  Accu-Chek Softclix Lancets lancets Use to check blood sugar in the morning, at noon, and at bedtime. 10/08/23 11/12/23  Catheryn Cluck, MD  ARIPiprazole (ABILIFY) 5 MG tablet Take 1 tablet (5 mg total) by mouth daily. 09/18/23   Augusta Blizzard, MD  bictegravir-emtricitabine-tenofovir AF (BIKTARVY) 50-200-25 MG TABS tablet TAKE 1 TABLET BY MOUTH EVERY DAY 08/01/23   [name], [name], FNP  Blood Glucose Monitoring Suppl (BLOOD GLUCOSE MONITOR SYSTEM) w/Device KIT Use to check blood sugar in the morning, at noon, and at bedtime. 10/08/23   Catheryn Cluck, MD  buPROPion (WELLBUTRIN XL) 150 MG 24 hr tablet Take 1 tablet (150 mg total) by mouth daily. 09/18/23 12/18/23  Augusta Blizzard, MD  diclofenac Sodium (VOLTAREN) 1 % GEL Apply 4 grams topically 4 (four) times daily to affected joint. 12/19/22   [name], MD  Elastic  Bandages & Supports (CARPAL TUNNEL WRIST STABILIZER) MISC 1 each by Does not apply route as needed. 09/19/23   Catheryn Cluck, MD  Elastic Bandages & Supports (MEDICAL COMPRESSION STOCKINGS) MISC 1 each by Does not apply route daily. 09/19/23 09/13/24  Catheryn Cluck, MD  escitalopram (LEXAPRO) 20 MG tablet Take 1 tablet (20 mg total) by mouth daily. 09/18/23   Augusta Blizzard, MD  esomeprazole (NEXIUM) 40 MG capsule Take 1 capsule (40 mg total) by mouth 2 (two) times daily before a meal. 06/20/23   [name], MD  famotidine (PEPCID) 40 MG tablet Take 1 tablet (40 mg total) by mouth at bedtime. 09/19/23 12/18/23  Catheryn Cluck, MD  finasteride (PROSCAR) 5 MG tablet Take 1 tablet (5 mg total) by mouth daily. 09/19/23   Catheryn Cluck, MD  furosemide (LASIX) 20 MG tablet Take 1 tablet (20 mg total) by mouth 2 (two) times daily as needed (swelling in legs). 10/03/23 09/27/24  Catheryn Cluck, MD  Glucose Blood (BLOOD GLUCOSE TEST STRIPS) STRP 1 each by In Vitro route in the morning, at noon, and at bedtime. May substitute to any manufacturer covered by patient's insurance. 10/08/23 11/13/23  Catheryn Cluck, MD  Lancet Device MISC 1 each by Does not apply route in the morning, at noon, and at bedtime. May substitute to any manufacturer covered by patient's insurance. 10/08/23 11/07/23  Catheryn Cluck, MD  metFORMIN (GLUCOPHAGE) 500 MG tablet Take 1 tablet (500 mg total) by mouth 2 (  two) times daily with a meal. 10/08/23   Catheryn Cluck, MD  methylPREDNISolone (MEDROL DOSEPAK) 4 MG TBPK tablet Take as directed 10/01/23   Wes Hamman, MD  olmesartan-hydrochlorothiazide (BENICAR HCT) 40-25 MG tablet Take 1 tablet by mouth daily. 09/19/23 12/18/23  Catheryn Cluck, MD  polyethylene glycol powder (GLYCOLAX/MIRALAX) 17 GM/SCOOP powder Dissolve 17 grams in liquid and drink by mouth every day. 09/19/23   Catheryn Cluck, MD  rosuvastatin (CRESTOR) 10 MG tablet Take 1 tablet (10 mg total) by mouth daily.  08/27/23   [name], [name], FNP  tadalafil (CIALIS) 10 MG tablet Take 0.5 tablets (5 mg total) by mouth daily. 09/19/23 12/18/23  Catheryn Cluck, MD  tirzepatide [facility]) 2.5 MG/0.5ML Pen Inject 2.5 mg into the skin once a week for 28 days. 10/08/23 11/05/23  Catheryn Cluck, MD  Vitamin D, Ergocalciferol, (DRISDOL) 1.25 MG (50000 UNIT) CAPS capsule Take 1 capsule (50,000 Units total) by mouth every 7 (seven) days. 09/18/23   Augusta Blizzard, MD      Allergies    Bactrim [sulfamethoxazole w/trimethoprim (co-trimoxazole)], Sulfa antibiotics, Cymbalta [duloxetine hcl], Duloxetine, Other, Zoloft [sertraline hcl], Clindamycin/lincomycin, and Neurontin [gabapentin]    Review of Systems   Review of Systems  Constitutional:  Negative for chills, fatigue and fever.  Respiratory:  Negative for cough, chest tightness, shortness of breath and wheezing.   Cardiovascular:  Negative for chest pain and palpitations.  Gastrointestinal:  Negative for abdominal pain, constipation, diarrhea, nausea and vomiting.  Neurological:  Negative for dizziness, seizures, weakness, light-headedness, numbness and headaches.    Physical Exam Updated Vital Signs BP 120/86 (BP Location: Left Arm)   Pulse 96   Temp 98.1 F (36.7 C) (Oral)   Resp 18   Ht 6' (1.829 m)   Wt 111.8 kg   SpO2 96%   BMI 33.43 kg/m  Physical Exam Vitals and nursing note reviewed.  Constitutional:      General: He is not in acute distress.    Appearance: Normal appearance. He is obese. He is not ill-appearing.  HENT:     Head: Normocephalic and atraumatic.  Eyes:     Conjunctiva/sclera: Conjunctivae normal.  Cardiovascular:     Rate and Rhythm: Normal rate.  Pulmonary:     Effort: Pulmonary effort is normal. No respiratory distress.  Abdominal:     General: Bowel sounds are normal. There is no distension.     Palpations: Abdomen is soft.     Tenderness: There is no abdominal tenderness. There is no guarding.  Musculoskeletal:      Right lower leg: No edema.     Left lower leg: No edema.  Skin:    Coloration: Skin is not jaundiced or pale.  Neurological:     Mental Status: He is alert and oriented to person, place, and time. Mental status is at baseline.     ED Results / Procedures / Treatments   Labs (all labs ordered are listed, but only abnormal results are displayed) Labs Reviewed  COMPREHENSIVE METABOLIC PANEL WITH GFR - Abnormal; Notable for the following components:      Result Value   Sodium 131 (*)    Chloride 91 (*)    Glucose, Bld 450 (*)    BUN 30 (*)    Creatinine, Ser 1.92 (*)    Total Protein 8.7 (*)    GFR, Estimated 43 (*)    All other components within normal limits  CBC - Abnormal; Notable for the following  components:   WBC 14.7 (*)    All other components within normal limits  URINALYSIS, ROUTINE W REFLEX MICROSCOPIC - Abnormal; Notable for the following components:   Glucose, UA >=500 (*)    All other components within normal limits  CBG MONITORING, ED - Abnormal; Notable for the following components:   Glucose-Capillary 479 (*)    All other components within normal limits  I-STAT VENOUS BLOOD GAS, ED - Abnormal; Notable for the following components:   pO2, Ven 47 (*)    Bicarbonate 29.8 (*)    Acid-Base Excess 4.0 (*)    Sodium 129 (*)    Calcium, Ion 1.05 (*)    All other components within normal limits  I-STAT CHEM 8, ED - Abnormal; Notable for the following components:   Sodium 129 (*)    Chloride 90 (*)    BUN 39 (*)    Creatinine, Ser 2.40 (*)    Glucose, Bld 678 (*)    Calcium, Ion 1.08 (*)    All other components within normal limits  LIPASE, BLOOD    EKG None  Radiology No results found.  Procedures Procedures    Medications Ordered in ED Medications  insulin aspart (novoLOG) injection 10 Units (has no administration in time range)  sodium chloride 0.9 % bolus 1,000 mL (1,000 mLs Intravenous New Bag/Given 10/11/23 2355)  ondansetron (ZOFRAN) injection  4 mg (4 mg Intravenous Given 10/11/23 2355)    ED Course/ Medical Decision Making/ A&P Clinical Course as of 10/12/23 0100  Fri Oct 11, 2023  2307 Sodium(!): 131 Corrected sodium 137 in setting of hyperglycemia [LB]  Sat Oct 12, 2023  0058 Glucose(!!): 678 Following 500 IVF. Prior to insulin [LB]    Clinical Course User Index [LB] Royann Cords, PA                                 Medical Decision Making Amount and/or Complexity of Data Reviewed Labs:  Decision-making details documented in ED Course.  Risk Prescription drug management. Decision regarding hospitalization.   Patient presents to the ED for concern of hyperglycemia, nausea, this involves an extensive number of treatment options, and is a complaint that carries with it a high risk of complications and morbidity.  The differential diagnosis includes DKA, HHS, hyperglycemia, uncontrolled hyperglycemia   Co morbidities that complicate the patient evaluation  See HPI   Additional history obtained:  Additional history obtained from Nursing and Outside Medical Records   External records from outside source obtained and reviewed including triage RN note, ED note from 10/07/2023, primary care provider note from 10/08/2023   Lab Tests:  I Ordered, and personally interpreted labs.  The pertinent results include:   Corrected sodium 137 CBG 450 BUN 30 Creatinine 1.92 (has been ranging 1.31-1.84 over the past 4 days)    Medicines ordered and prescription drug management:  I ordered medication including NS, zofran  for hyperglycemia  Reevaluation of the patient after these medicines showed that the patient stayed the same I have reviewed the patients home medicines and have made adjustments as needed     Problem List / ED Course:  Hyperglycemia CBG in ED noted to be 450.  Anion gap 15. Will provide NS, zofran and reassess VBG notable for no metabolic acidosis Attempted to control sugar with IVF however sugar  increased. In setting of increased blood sugar requiring insulin, continuously elevated creatinine, I feel that  patient would benefit from admission. AKI Baseline patient has had normal creatinine, however, 5 days ago, patient's creatinine is noted to be 1.80.  Since then, patient's creatinine has increased to  2.6   Reevaluation:  After the interventions noted above, I reevaluated the patient and found that they have :stayed the same   Social Determinants of Health:  Has follow-up with primary care provider, diabetic counselor, nutritionist   Dispostion:  After consideration of the diagnostic results and the patients response to treatment, I feel that the patent would benefit from admission for glycemic control and elevated creatinine  Sign out to Atlee Leach PA-C pending hospitalist admission  Discussed patient, ED workup with Dr. Tamela Fake who agrees with plan Final Clinical Impression(s) / ED Diagnoses Final diagnoses:  Hyperglycemia  AKI (acute kidney injury) [facility])    Rx / DC Orders ED Discharge Orders     None         Royann Cords, PA 10/12/23 0050    Royann Cords, PA 10/12/23 [id number]    Scarlette Currier, MD 10/12/23 1116

## 2023-10-11 NOTE — ED Triage Notes (Signed)
 The pt was daignosed with diabetes  2 days ago and he was placed  on metformin his sugar is not coming down and the pt is concerned

## 2023-10-11 NOTE — Discharge Instructions (Addendum)
 Thank you for letting us evaluate you today.  Your sugar was elevated at 450 and you had a mildly elevated kidney function.  We have corrected both of those with 1 L of fluids via your IV.  We provided you with antinausea medicine to help with that as well.  I have sent a prescription for antinausea medicine for you to use as needed.   Please make sure to continue with strict follow-up with primary care provider, nutritionist, diabetic manager as scheduled.  Continue taking your sugar and monitoring this as well.  Keeping a log is a great idea  Return to emergency department if you experience significant worsening sugar level, altered mentation, blurred vision

## 2023-10-12 ENCOUNTER — Other Ambulatory Visit (HOSPITAL_COMMUNITY): Payer: Self-pay

## 2023-10-12 DIAGNOSIS — N179 Acute kidney failure, unspecified: Secondary | ICD-10-CM | POA: Diagnosis not present

## 2023-10-12 DIAGNOSIS — Z21 Asymptomatic human immunodeficiency virus [HIV] infection status: Secondary | ICD-10-CM

## 2023-10-12 DIAGNOSIS — Z79899 Other long term (current) drug therapy: Secondary | ICD-10-CM | POA: Diagnosis not present

## 2023-10-12 DIAGNOSIS — E785 Hyperlipidemia, unspecified: Secondary | ICD-10-CM | POA: Diagnosis present

## 2023-10-12 DIAGNOSIS — Z6833 Body mass index (BMI) 33.0-33.9, adult: Secondary | ICD-10-CM | POA: Diagnosis not present

## 2023-10-12 DIAGNOSIS — B20 Human immunodeficiency virus [HIV] disease: Secondary | ICD-10-CM | POA: Diagnosis present

## 2023-10-12 DIAGNOSIS — E869 Volume depletion, unspecified: Secondary | ICD-10-CM | POA: Diagnosis present

## 2023-10-12 DIAGNOSIS — Z87891 Personal history of nicotine dependence: Secondary | ICD-10-CM | POA: Diagnosis not present

## 2023-10-12 DIAGNOSIS — Z8249 Family history of ischemic heart disease and other diseases of the circulatory system: Secondary | ICD-10-CM | POA: Diagnosis not present

## 2023-10-12 DIAGNOSIS — Z823 Family history of stroke: Secondary | ICD-10-CM | POA: Diagnosis not present

## 2023-10-12 DIAGNOSIS — E871 Hypo-osmolality and hyponatremia: Secondary | ICD-10-CM | POA: Diagnosis present

## 2023-10-12 DIAGNOSIS — B181 Chronic viral hepatitis B without delta-agent: Secondary | ICD-10-CM | POA: Diagnosis present

## 2023-10-12 DIAGNOSIS — I10 Essential (primary) hypertension: Secondary | ICD-10-CM | POA: Diagnosis present

## 2023-10-12 DIAGNOSIS — Z7985 Long-term (current) use of injectable non-insulin antidiabetic drugs: Secondary | ICD-10-CM | POA: Diagnosis not present

## 2023-10-12 DIAGNOSIS — E669 Obesity, unspecified: Secondary | ICD-10-CM | POA: Diagnosis present

## 2023-10-12 DIAGNOSIS — N170 Acute kidney failure with tubular necrosis: Secondary | ICD-10-CM | POA: Diagnosis present

## 2023-10-12 DIAGNOSIS — R739 Hyperglycemia, unspecified: Secondary | ICD-10-CM | POA: Diagnosis present

## 2023-10-12 DIAGNOSIS — E1165 Type 2 diabetes mellitus with hyperglycemia: Secondary | ICD-10-CM | POA: Diagnosis not present

## 2023-10-12 DIAGNOSIS — D72829 Elevated white blood cell count, unspecified: Secondary | ICD-10-CM | POA: Diagnosis present

## 2023-10-12 DIAGNOSIS — F319 Bipolar disorder, unspecified: Secondary | ICD-10-CM | POA: Diagnosis present

## 2023-10-12 DIAGNOSIS — M797 Fibromyalgia: Secondary | ICD-10-CM | POA: Diagnosis present

## 2023-10-12 DIAGNOSIS — F419 Anxiety disorder, unspecified: Secondary | ICD-10-CM | POA: Diagnosis present

## 2023-10-12 DIAGNOSIS — F32A Depression, unspecified: Secondary | ICD-10-CM

## 2023-10-12 DIAGNOSIS — E1100 Type 2 diabetes mellitus with hyperosmolarity without nonketotic hyperglycemic-hyperosmolar coma (NKHHC): Secondary | ICD-10-CM | POA: Diagnosis present

## 2023-10-12 DIAGNOSIS — G4733 Obstructive sleep apnea (adult) (pediatric): Secondary | ICD-10-CM | POA: Diagnosis present

## 2023-10-12 DIAGNOSIS — Z7984 Long term (current) use of oral hypoglycemic drugs: Secondary | ICD-10-CM | POA: Diagnosis not present

## 2023-10-12 LAB — I-STAT VENOUS BLOOD GAS, ED
Acid-Base Excess: 4 mmol/L — ABNORMAL HIGH (ref 0.0–2.0)
Bicarbonate: 29.8 mmol/L — ABNORMAL HIGH (ref 20.0–28.0)
Calcium, Ion: 1.05 mmol/L — ABNORMAL LOW (ref 1.15–1.40)
HCT: 46 % (ref 39.0–52.0)
Hemoglobin: 15.6 g/dL (ref 13.0–17.0)
O2 Saturation: 82 %
Potassium: 4.5 mmol/L (ref 3.5–5.1)
Sodium: 129 mmol/L — ABNORMAL LOW (ref 135–145)
TCO2: 31 mmol/L (ref 22–32)
pCO2, Ven: 48.9 mmHg (ref 44–60)
pH, Ven: 7.393 (ref 7.25–7.43)
pO2, Ven: 47 mmHg — ABNORMAL HIGH (ref 32–45)

## 2023-10-12 LAB — CBC WITH DIFFERENTIAL/PLATELET
Abs Immature Granulocytes: 0.05 10*3/uL (ref 0.00–0.07)
Basophils Absolute: 0 10*3/uL (ref 0.0–0.1)
Basophils Relative: 0 %
Eosinophils Absolute: 0 10*3/uL (ref 0.0–0.5)
Eosinophils Relative: 0 %
HCT: 42.1 % (ref 39.0–52.0)
Hemoglobin: 13.7 g/dL (ref 13.0–17.0)
Immature Granulocytes: 0 %
Lymphocytes Relative: 27 %
Lymphs Abs: 3.5 10*3/uL (ref 0.7–4.0)
MCH: 28.4 pg (ref 26.0–34.0)
MCHC: 32.5 g/dL (ref 30.0–36.0)
MCV: 87.2 fL (ref 80.0–100.0)
Monocytes Absolute: 1.1 10*3/uL — ABNORMAL HIGH (ref 0.1–1.0)
Monocytes Relative: 8 %
Neutro Abs: 8.5 10*3/uL — ABNORMAL HIGH (ref 1.7–7.7)
Neutrophils Relative %: 65 %
Platelets: 241 10*3/uL (ref 150–400)
RBC: 4.83 MIL/uL (ref 4.22–5.81)
RDW: 13.2 % (ref 11.5–15.5)
WBC: 13.2 10*3/uL — ABNORMAL HIGH (ref 4.0–10.5)
nRBC: 0 % (ref 0.0–0.2)

## 2023-10-12 LAB — I-STAT CHEM 8, ED
BUN: 39 mg/dL — ABNORMAL HIGH (ref 6–20)
Calcium, Ion: 1.08 mmol/L — ABNORMAL LOW (ref 1.15–1.40)
Chloride: 90 mmol/L — ABNORMAL LOW (ref 98–111)
Creatinine, Ser: 2.4 mg/dL — ABNORMAL HIGH (ref 0.61–1.24)
Glucose, Bld: 678 mg/dL (ref 70–99)
HCT: 47 % (ref 39.0–52.0)
Hemoglobin: 16 g/dL (ref 13.0–17.0)
Potassium: 4.5 mmol/L (ref 3.5–5.1)
Sodium: 129 mmol/L — ABNORMAL LOW (ref 135–145)
TCO2: 28 mmol/L (ref 22–32)

## 2023-10-12 LAB — BASIC METABOLIC PANEL WITH GFR
Anion gap: 14 (ref 5–15)
BUN: 34 mg/dL — ABNORMAL HIGH (ref 6–20)
CO2: 25 mmol/L (ref 22–32)
Calcium: 8.7 mg/dL — ABNORMAL LOW (ref 8.9–10.3)
Chloride: 92 mmol/L — ABNORMAL LOW (ref 98–111)
Creatinine, Ser: 1.9 mg/dL — ABNORMAL HIGH (ref 0.61–1.24)
GFR, Estimated: 43 mL/min — ABNORMAL LOW (ref >60)
Glucose, Bld: 476 mg/dL — ABNORMAL HIGH (ref 70–99)
Potassium: 4.2 mmol/L (ref 3.5–5.1)
Sodium: 131 mmol/L — ABNORMAL LOW (ref 135–145)

## 2023-10-12 LAB — GLUCOSE, CAPILLARY
Glucose-Capillary: 373 mg/dL — ABNORMAL HIGH (ref 70–99)
Glucose-Capillary: 426 mg/dL — ABNORMAL HIGH (ref 70–99)
Glucose-Capillary: 435 mg/dL — ABNORMAL HIGH (ref 70–99)
Glucose-Capillary: 283 mg/dL — ABNORMAL HIGH (ref 70–99)

## 2023-10-12 LAB — CBG MONITORING, ED
Glucose-Capillary: >600 mg/dL (ref 70–99)
Glucose-Capillary: 449 mg/dL — ABNORMAL HIGH (ref 70–99)

## 2023-10-12 MED ORDER — INSULIN ASPART 100 UNIT/ML IJ SOLN
0.0000 [IU] | Freq: Every day | INTRAMUSCULAR | Status: DC
  Administered 2023-10-12 – 2023-10-13 (×2): 3 [IU] via SUBCUTANEOUS

## 2023-10-12 MED ORDER — LOPERAMIDE HCL 2 MG PO CAPS
2.0000 mg | ORAL_CAPSULE | ORAL | Status: DC | PRN
  Administered 2023-10-12: 2 mg via ORAL
  Filled 2023-10-12: qty 1

## 2023-10-12 MED ORDER — BICTEGRAVIR-EMTRICITAB-TENOFOV 50-200-25 MG PO TABS
1.0000 | ORAL_TABLET | Freq: Every day | ORAL | Status: DC
  Administered 2023-10-12 – 2023-10-14 (×3): 1 via ORAL
  Filled 2023-10-12 (×3): qty 1

## 2023-10-12 MED ORDER — ONDANSETRON HCL 4 MG PO TABS
4.0000 mg | ORAL_TABLET | Freq: Four times a day (QID) | ORAL | Status: DC | PRN
  Administered 2023-10-13: 4 mg via ORAL
  Filled 2023-10-12: qty 1

## 2023-10-12 MED ORDER — ACETAMINOPHEN 325 MG PO TABS
650.0000 mg | ORAL_TABLET | Freq: Four times a day (QID) | ORAL | Status: DC | PRN
  Administered 2023-10-13: 650 mg via ORAL
  Filled 2023-10-12: qty 2

## 2023-10-12 MED ORDER — ROSUVASTATIN CALCIUM 5 MG PO TABS
10.0000 mg | ORAL_TABLET | Freq: Every day | ORAL | Status: DC
  Administered 2023-10-12 – 2023-10-14 (×3): 10 mg via ORAL
  Filled 2023-10-12 (×3): qty 2

## 2023-10-12 MED ORDER — ONDANSETRON HCL 4 MG/2ML IJ SOLN: 4.0000 mg | Freq: Four times a day (QID) | INTRAMUSCULAR | Status: DC | PRN

## 2023-10-12 MED ORDER — SODIUM CHLORIDE 0.9 % IV SOLN: INTRAVENOUS | Status: DC

## 2023-10-12 MED ORDER — INSULIN ASPART 100 UNIT/ML IJ SOLN
0.0000 [IU] | Freq: Three times a day (TID) | INTRAMUSCULAR | Status: DC
  Administered 2023-10-12: 20 [IU] via SUBCUTANEOUS
  Administered 2023-10-12: 15 [IU] via SUBCUTANEOUS
  Administered 2023-10-13: 5 [IU] via SUBCUTANEOUS
  Administered 2023-10-13: 8 [IU] via SUBCUTANEOUS
  Administered 2023-10-13: 3 [IU] via SUBCUTANEOUS
  Administered 2023-10-14: 5 [IU] via SUBCUTANEOUS
  Administered 2023-10-14: 3 [IU] via SUBCUTANEOUS

## 2023-10-12 MED ORDER — INSULIN ASPART 100 UNIT/ML IJ SOLN
10.0000 [IU] | Freq: Once | INTRAMUSCULAR | Status: AC
  Administered 2023-10-12: 10 [IU] via INTRAVENOUS

## 2023-10-12 MED ORDER — INSULIN GLARGINE-YFGN 100 UNIT/ML ~~LOC~~ SOLN
15.0000 [IU] | Freq: Every day | SUBCUTANEOUS | Status: DC
  Administered 2023-10-12 – 2023-10-13 (×2): 15 [IU] via SUBCUTANEOUS
  Filled 2023-10-12 (×2): qty 0.15

## 2023-10-12 MED ORDER — LIVING WELL WITH DIABETES BOOK
Freq: Once | Status: DC
  Filled 2023-10-12: qty 1

## 2023-10-12 MED ORDER — HYDRALAZINE HCL 20 MG/ML IJ SOLN: 10.0000 mg | INTRAMUSCULAR | Status: DC | PRN

## 2023-10-12 MED ORDER — ESCITALOPRAM OXALATE 20 MG PO TABS
20.0000 mg | ORAL_TABLET | Freq: Every day | ORAL | Status: DC
  Administered 2023-10-12 – 2023-10-14 (×3): 20 mg via ORAL
  Filled 2023-10-12: qty 2
  Filled 2023-10-12 (×2): qty 1

## 2023-10-12 MED ORDER — INSULIN STARTER KIT- PEN NEEDLES (ENGLISH)
1.0000 | Freq: Once | Status: AC
  Administered 2023-10-12: 1
  Filled 2023-10-12 (×2): qty 1

## 2023-10-12 MED ORDER — BUPROPION HCL ER (XL) 150 MG PO TB24
150.0000 mg | ORAL_TABLET | Freq: Every day | ORAL | Status: DC
  Administered 2023-10-12 – 2023-10-14 (×3): 150 mg via ORAL
  Filled 2023-10-12 (×3): qty 1

## 2023-10-12 MED ORDER — ARIPIPRAZOLE 10 MG PO TABS
5.0000 mg | ORAL_TABLET | Freq: Every day | ORAL | Status: DC
  Administered 2023-10-12 – 2023-10-14 (×3): 5 mg via ORAL
  Filled 2023-10-12 (×3): qty 1

## 2023-10-12 MED ORDER — FINASTERIDE 5 MG PO TABS
5.0000 mg | ORAL_TABLET | Freq: Every day | ORAL | Status: DC
  Administered 2023-10-12 – 2023-10-14 (×3): 5 mg via ORAL
  Filled 2023-10-12 (×3): qty 1

## 2023-10-12 MED ORDER — ALBUTEROL SULFATE (2.5 MG/3ML) 0.083% IN NEBU: 2.5000 mg | INHALATION_SOLUTION | Freq: Four times a day (QID) | RESPIRATORY_TRACT | Status: DC | PRN

## 2023-10-12 MED ORDER — ACETAMINOPHEN 650 MG RE SUPP: 650.0000 mg | Freq: Four times a day (QID) | RECTAL | Status: DC | PRN

## 2023-10-12 MED ORDER — PANTOPRAZOLE SODIUM 40 MG PO TBEC
40.0000 mg | DELAYED_RELEASE_TABLET | Freq: Every day | ORAL | Status: DC
  Administered 2023-10-12 – 2023-10-14 (×2): 40 mg via ORAL
  Filled 2023-10-12 (×3): qty 1

## 2023-10-12 MED ORDER — SODIUM CHLORIDE 0.9 % IV BOLUS
1000.0000 mL | Freq: Once | INTRAVENOUS | Status: AC
  Administered 2023-10-12: 1000 mL via INTRAVENOUS

## 2023-10-12 MED ORDER — ENOXAPARIN SODIUM 40 MG/0.4ML IJ SOSY
40.0000 mg | PREFILLED_SYRINGE | INTRAMUSCULAR | Status: DC
  Administered 2023-10-12 – 2023-10-13 (×2): 40 mg via SUBCUTANEOUS
  Filled 2023-10-12 (×2): qty 0.4

## 2023-10-12 MED ORDER — SODIUM CHLORIDE 0.9% FLUSH
3.0000 mL | Freq: Two times a day (BID) | INTRAVENOUS | Status: DC
  Administered 2023-10-12 – 2023-10-13 (×3): 3 mL via INTRAVENOUS

## 2023-10-12 NOTE — Care Management (Signed)
 Transition of Care [facility]) - Inpatient Brief Assessment   Patient Details  Name: Phillip Gill MRN: [id number] Date of Birth: December 26, 1975  [facility]) CM/SW Contact:    Ronni Colace, RN Phone Number: 10/12/2023, 11:06 AM   Clinical Narrative: 48 yo with history of HIV sees ID, new onset DM. A1C 10, Came back into the ED from yesterday, where he was ordered Glucophage. Will likely be on insulin now and need teaching, diabetic educator consulted.  He does not eat pork, order changed to reflect this to dietary. E will need close F/U with PCP.  No further needs identified. The paitent will be discussed in daily progressive rounds. If a need arises, please place a TOC consult    Transition of Care Asessment: Insurance and Status: Insurance coverage has been reviewed Patient has primary care physician: Yes   Prior level of function:: Independent Prior/Current Home Services: No current home services Social Drivers of Health Review: SDOH reviewed needs interventions Readmission risk has been reviewed: Yes Transition of care needs: transition of care needs identified, TOC will continue to follow

## 2023-10-12 NOTE — ED Notes (Signed)
 Breakfast tray sent back as pt does not consume pork products.Pt request no pork products on meal tray.

## 2023-10-12 NOTE — ED Notes (Signed)
 PT OTF with transport, in no new onset distress. Fluids paused for transport.

## 2023-10-12 NOTE — Inpatient Diabetes Management (Signed)
 Inpatient Diabetes Program Recommendations  AACE/ADA: New Consensus Statement on Inpatient Glycemic Control (2015)  Target Ranges:  Prepandial:   less than 140 mg/dL      Peak postprandial:   less than 180 mg/dL (1-2 hours)      Critically ill patients:  140 - 180 mg/dL   Lab Results  Component Value Date   GLUCAP 373 (H) 10/12/2023   HGBA1C 10.3 (A) 10/08/2023    Review of Glycemic Control  Latest Reference Range & Units 10/11/23 16:55 10/12/23 01:56 10/12/23 08:28 10/12/23 12:19  Glucose-Capillary 70 - 99 mg/dL 119 (H) >147 (HH) 829 (H) 373 (H)  (HH): Data is critically high (H): Data is abnormally high  Latest Reference Range & Units 10/08/23 11:21  C-Peptide 0.80 - 3.85 ng/mL 2.87    Diabetes history: New DM2 Outpatient Diabetes medications: Was ordered Mounjaro 2.5 mg weekly and Metformin 500 mg BID on 10/08/23 Current orders for Inpatient glycemic control: Semglee 15 units every day, Novolog 0-15 units TID and 0-5 units QHS  Noted consult for Diabetes Coordinator. Diabetes Coordinator is not on campus over the weekend but available by pager from 8am to 5pm for questions or concerns. Chart reviewed.   Spoke with patient over the phone.  He was diagnosed with DM2 a few days ago.  Mounjaro 2.5 mg and Metformin 500 mg BID was prescribed by his PCP at [facility] on 10/08/23.  He started taking the Metformin and the Mounjaro will be shipped to him.  He has obtained a glucometer.  He is scheduled for outpatient diabetes education.    Spoke with pt about new diagnosis. Discussed A1C results with them and explained what an A1C is, basic pathophysiology of DM Type 2, basic home care, basic diabetes diet nutrition principles, importance of checking CBGs and maintaining good CBG control to prevent long-term and short-term complications. Reviewed signs and symptoms of hyperglycemia and hypoglycemia and how to treat hypoglycemia at home. Also reviewed blood sugar goals at home.  RNs to provide  ongoing basic DM education at bedside with this patient. Have ordered educational booklet & insulin starter kit. Have also placed RD consult for DM diet education for this patient.   Explained he will likely ned insulin when he is discharged.  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  Will attach DM and insulin pen administration education to exit care.    Educated on The Plate Method, CHO's, portion control, CBGs at home fasting and mid afternoon, F/U with PCP every 3 months, bring meter to PCP office, long and short term complications of uncontrolled BG, and importance of exercise.  Reviewed hypoglycemia, < 70 mg/dL, signs, symptoms and treatments.   Please use each patient interaction to provide diabetes education. Please review Living Well with Diabetes booklet with the patient, and instruct on insulin administration. Please allow patient to be actively engaged with diabetes management by allowing patient to check own glucose and self-administer insulin injections. Diabetes Coordinator will follow up with patient and reinforce diabetes education.  Will continue to follow while inpatient.  Thank you, Phillip Lipschutz, MSN, CDCES Diabetes Coordinator Inpatient Diabetes Program [phone] (team pager from 8a-5p)

## 2023-10-12 NOTE — ED Notes (Addendum)
 MD Manny Sees aware of 449 cbg, Will revulate  CBG after fluid bolus and symglee administration, no further interventions until then.

## 2023-10-12 NOTE — H&P (Addendum)
 History and Physical    Patient: Phillip Gill:[id number] DOB: 1976-02-20 DOA: 10/11/2023 DOS: the patient was seen and examined on 10/12/2023 PCP: Catheryn Cluck, MD  Patient coming from: Home  Chief Complaint:  Chief Complaint  Patient presents with   Hyperglycemia   HPI: Phillip Gill is a 48 y.o. male with medical history significant of hypertension, HIV, recently diagnosed diabetes mellitus type II, anxiety, depression and OSA who presents with nausea and decreased appetite.    He had been initially seen by his primary couple weeks ago with reports of lower extremity swelling.  At that time his blood pressure medications were changed from amlodipine to olmesartan-hydrochlorothiazide and furosemide as needed.  He follow back up to the office on 4/3 as advised to take furosemide 20 mg twice daily.  Shortly thereafter he reported experiencing dizziness, nausea, increased sleepiness, and frequent urination.  He feels hot all the time, but has no recent fevers, chills, rashes, or wounds. His primary care provider had ordered repeat labs due to the increased dose of Lasix on 4/7, which revealed blood sugar elevated at 782 and acute kidney injury with creatinine elevated up to 1.8.  Patient was sent to the emergency department and treated with 2 L of lactated Ringer's, NovoLog 10 units insulin and temporarily placed on a drip prior to being discharged home on metformin.  Review of records note hemoglobin A1c was noted to be 10.3 and C-peptide within normal limits at 2.87.  He has been taking the metformin as prescribed.    His blood glucose had fluctuated between 300 and 500 mg/dL. His primary had sent in for him to start Mounjaro, but has not received the medication yet.  In the emergency department patient was noted to be afebrile with pulse 83-103, respirations 16-22, blood pressures 119/86 to 153/100, and O2 saturations maintained on room air.  Labs from yesterday noted WBC 14.7,  sodium 131, chloride 91, BUN 30, creatinine 1.92, glucose 450, anion gap 15.  Venous blood gas pH within normal limits at 7.393.  Urinalysis was positive for glucose and negative for ketones.  Patient was given 1 L normal saline IV fluids and NovoLog 10 units.  Review of Systems: As mentioned in the history of present illness. All other systems reviewed and are negative. 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 Seymour Dapper- sees him every 4 months   Hypertension    IBS (irritable bowel syndrome)    Migraine    "none in years" (02/16/2015   Overweight 07/20/2015   Renal insufficiency 12/29/2013   Past Surgical History:  Procedure Laterality Date   CO2 LASER APPLICATION N/A 02/11/2013   Procedure: CO2 LASER APPLICATION;  Surgeon: Joyce Nixon, MD;  Location: [facility];  Service: General;  Laterality: N/A;   FOOT SURGERY     HIGH RESOLUTION ANOSCOPY N/A 02/11/2013   Procedure: HIGH RESOLUTION ANOSCOPY WITH BIOPSY, LASER ABLATION;  Surgeon: Joyce Nixon, MD;  Location: [facility] [place];  Service: General;  Laterality: N/A;   WISDOM TOOTH EXTRACTION     Social History:  reports that he quit smoking about 2 years ago. His smoking  use included cigarettes. He started smoking about 20 years ago. He has a 18 pack-year smoking history. He has never been exposed to tobacco smoke. He has never used smokeless tobacco. He reports current alcohol use. He reports that he does not use drugs.  Allergies  Allergen Reactions   Bactrim [Sulfamethoxazole W/Trimethoprim (Co-Trimoxazole)] Hives and Shortness Of Breath   Sulfa Antibiotics Hives and Shortness Of Breath   Cymbalta [Duloxetine Hcl] Diarrhea   Duloxetine Diarrhea   Other Hives and  Swelling    [employer] toothpaste    Zoloft [Sertraline Hcl] Diarrhea   Clindamycin/Lincomycin Rash   Neurontin [Gabapentin] Rash    Family History  Problem Relation Age of Onset   Hypertension Mother    Diabetes Mother    Stroke Mother        cerbral aneurysm   Pancreatic cancer Mother    Colon cancer Father    Hypertension Brother    Sleep apnea Cousin    Mental illness Neg Hx    Stomach cancer Neg Hx    Esophageal cancer Neg Hx     Prior to Admission medications   Medication Sig Start Date End Date Taking? Authorizing Provider  ARIPiprazole (ABILIFY) 5 MG tablet Take 1 tablet (5 mg total) by mouth daily. 09/18/23  Yes Augusta Blizzard, MD  bictegravir-emtricitabine-tenofovir AF (BIKTARVY) 50-200-25 MG TABS tablet TAKE 1 TABLET BY MOUTH EVERY DAY 08/01/23  Yes [name], [name], FNP  buPROPion (WELLBUTRIN XL) 150 MG 24 hr tablet Take 1 tablet (150 mg total) by mouth daily. 09/18/23 12/18/23 Yes Augusta Blizzard, MD  escitalopram (LEXAPRO) 20 MG tablet Take 1 tablet (20 mg total) by mouth daily. 09/18/23  Yes Augusta Blizzard, MD  esomeprazole (NEXIUM) 40 MG capsule Take 1 capsule (40 mg total) by mouth 2 (two) times daily before a meal. Patient taking differently: Take 40 mg by mouth daily as needed (for acid reflux). 06/20/23  Yes [name], MD  finasteride (PROSCAR) 5 MG tablet Take 1 tablet (5 mg total) by mouth daily. 09/19/23  Yes Catheryn Cluck, MD  furosemide (LASIX) 20 MG tablet Take 1 tablet (20 mg total) by mouth 2 (two) times daily as needed (swelling in legs). 10/03/23 09/27/24 Yes Catheryn Cluck, MD  metFORMIN (GLUCOPHAGE) 500 MG tablet Take 1 tablet (500 mg total) by mouth 2 (two) times daily with a meal. 10/08/23  Yes Catheryn Cluck, MD  olmesartan-hydrochlorothiazide (BENICAR HCT) 40-25 MG tablet Take 1 tablet by mouth daily. 09/19/23 12/18/23 Yes Catheryn Cluck, MD  rosuvastatin (CRESTOR) 10 MG tablet Take 1 tablet (10 mg total) by mouth daily. 08/27/23  Yes [name], [name], FNP   Vitamin D, Ergocalciferol, (DRISDOL) 1.25 MG (50000 UNIT) CAPS capsule Take 1 capsule (50,000 Units total) by mouth every 7 (seven) days. 09/18/23  Yes Augusta Blizzard, MD  Accu-Chek Softclix Lancets lancets Use to check blood sugar in the morning, at noon, and at bedtime. 10/08/23 11/12/23  Catheryn Cluck, MD  Blood Glucose Monitoring Suppl (BLOOD GLUCOSE MONITOR SYSTEM) w/Device KIT Use to check blood sugar in the morning, at noon, and at bedtime. 10/08/23   Catheryn Cluck, MD  diclofenac Sodium (VOLTAREN) 1 % GEL Apply 4 grams topically 4 (four) times daily to affected joint. Patient not taking: Reported on 10/12/2023 12/19/22   Corey, Evan S, MD  Elastic Bandages & Supports (CARPAL TUNNEL WRIST STABILIZER) MISC 1 each by Does not apply route as needed. 09/19/23   Catheryn Cluck, MD  Elastic  Bandages & Supports (MEDICAL COMPRESSION STOCKINGS) MISC 1 each by Does not apply route daily. 09/19/23 09/13/24  Catheryn Cluck, MD  famotidine (PEPCID) 40 MG tablet Take 1 tablet (40 mg total) by mouth at bedtime. Patient not taking: Reported on 10/12/2023 09/19/23 12/18/23  Catheryn Cluck, MD  Glucose Blood (BLOOD GLUCOSE TEST STRIPS) STRP 1 each by In Vitro route in the morning, at noon, and at bedtime. May substitute to any manufacturer covered by patient's insurance. 10/08/23 11/13/23  Catheryn Cluck, MD  Lancet Device MISC 1 each by Does not apply route in the morning, at noon, and at bedtime. May substitute to any manufacturer covered by patient's insurance. 10/08/23 11/07/23  Catheryn Cluck, MD  methylPREDNISolone (MEDROL DOSEPAK) 4 MG TBPK tablet Take as directed Patient not taking: Reported on 10/12/2023 10/01/23   Wes Hamman, MD  polyethylene glycol powder (GLYCOLAX/MIRALAX) 17 GM/SCOOP powder Dissolve 17 grams in liquid and drink by mouth every day. Patient not taking: Reported on 10/12/2023 09/19/23   Catheryn Cluck, MD  tadalafil (CIALIS) 10 MG tablet Take 0.5 tablets (5 mg total) by mouth  daily. Patient not taking: Reported on 10/12/2023 09/19/23 12/18/23  Catheryn Cluck, MD  tirzepatide (MOUNJARO) 2.5 MG/0.5ML Pen Inject 2.5 mg into the skin once a week for 28 days. Patient not taking: Reported on 10/12/2023 10/08/23 11/05/23  Catheryn Cluck, MD    Physical Exam: Vitals:   10/11/23 1654 10/11/23 1703 10/11/23 2255 10/12/23 0332  BP: (!) 153/100  120/86 119/86  Pulse: (!) 103  96 83  Resp: (!) 22  18 16   Temp: 98.8 F (37.1 C)  98.1 F (36.7 C) 98 F (36.7 C)  TempSrc:   Oral Oral  SpO2: 94%  96% 96%  Weight:  111.8 kg    Height:  6' (1.829 m)      Constitutional: Obese middle-aged male currently in no acute distress Eyes: PERRL, lids and conjunctivae normal ENMT: Mucous membranes are moist. Normal dentition.  Neck: normal, supple,  Respiratory: clear to auscultation bilaterally, no wheezing, no crackles. Normal respiratory effort. No accessory muscle use.  Cardiovascular: Regular rate and rhythm, no murmurs / rubs / gallops. No extremity edema. 2+ pedal pulses.   Abdomen: no tenderness, no masses palpated.  Bowel sounds positive.  Musculoskeletal: no clubbing / cyanosis. No joint deformity upper and lower extremities. Good ROM, no contractures. Normal muscle tone.  Skin: no rashes, lesions, ulcers. No induration Neurologic: CN 2-12 grossly intact  Strength 5/5 in all 4.  Psychiatric: Normal judgment and insight. Alert and oriented x 3. Normal mood.   Data Reviewed:  reviewed labs, imaging, and pertinent records as documented.  Assessment and Plan:  Uncontrolled diabetes mellitus type 2 with hyperglycemia On admission patient presents glucose elevated up to 450 and anion gap of 15.  Venous blood gas was noted to be within normal limits and urinalysis showed glucose without ketones.  Hemoglobin A1c noted to be 10.3 when checked on 4/8 with C-peptide within normal limits.  Does not appear to be in DKA.  Patient had been given 1 L of normal saline IV fluids and  NovoLog 10 units. - Admit to med telemetry bed - Hypoglycemic protocols - Semglee 15 units daily - CBGs before every meal with moderate SSI - Diabetic education consulted  Leukocytosis Acute.  WBC elevated at 14.7.  Urinalysis did not show significant signs for infection.  Patient does not report any complaints of cough or shortness of breath. -  Recheck CBC with differential  Acute kidney injury Patient presents with BUN 30 and creatinine 1.92.  At baseline patient's creatinine noted to be around 1.1.  Patient had been bolused 1 L normal saline IV fluids.  Urinalysis did not show any signs for infection. - Avoid possible nephrotoxic agents(including furosemide, olmesartan-hydrochlorothiazide) - Bolus an additional liter of normal saline IV fluids and then placed on a rate of 150 mL/h - Continue to monitor kidney function  HIV Patient followed in outpatient setting by infectious disease.  Absolute CD4 count noted to be 1023 when checked 08/01/2023. - Continue Biktarvy and outpatient follow-up  Essential hypertension Blood pressures currently maintained. - Consider resuming olmesartan and adjusting dose, but likely need to stop the combination pill with hydrochlorothiazide - Hydralazine IV as needed  Anxiety and depression - Continue Abilify, Wellbutrin, and Lexapro  Hyperlipidemia - Continue atorvastatin  Obesity BMI 33.43 kg/m  DVT prophylaxis: Lovenox Advance Care Planning:   Code Status: Full Code    Consults: Diabetic education  Family Communication: None  Severity of Illness: The appropriate patient status for this patient is INPATIENT. Inpatient status is judged to be reasonable and necessary in order to provide the required intensity of service to ensure the patient's safety. The patient's presenting symptoms, physical exam findings, and initial radiographic and laboratory data in the context of their chronic comorbidities is felt to place them at high risk for  further clinical deterioration. Furthermore, it is not anticipated that the patient will be medically stable for discharge from the hospital within 2 midnights of admission.   * I certify that at the point of admission it is my clinical judgment that the patient will require inpatient hospital care spanning beyond 2 midnights from the point of admission due to high intensity of service, high risk for further deterioration and high frequency of surveillance required.*  Author: Lena Qualia, MD 10/12/2023 7:18 AM  For on call review [email or web address].

## 2023-10-13 DIAGNOSIS — E1165 Type 2 diabetes mellitus with hyperglycemia: Secondary | ICD-10-CM | POA: Diagnosis not present

## 2023-10-13 LAB — BASIC METABOLIC PANEL WITH GFR
Anion gap: 8 (ref 5–15)
BUN: 22 mg/dL — ABNORMAL HIGH (ref 6–20)
CO2: 25 mmol/L (ref 22–32)
Calcium: 8.8 mg/dL — ABNORMAL LOW (ref 8.9–10.3)
Chloride: 103 mmol/L (ref 98–111)
Creatinine, Ser: 1.34 mg/dL — ABNORMAL HIGH (ref 0.61–1.24)
GFR, Estimated: >60 mL/min (ref >60)
Glucose, Bld: 234 mg/dL — ABNORMAL HIGH (ref 70–99)
Potassium: 3.9 mmol/L (ref 3.5–5.1)
Sodium: 136 mmol/L (ref 135–145)

## 2023-10-13 LAB — GLUCOSE, CAPILLARY
Glucose-Capillary: 164 mg/dL — ABNORMAL HIGH (ref 70–99)
Glucose-Capillary: 268 mg/dL — ABNORMAL HIGH (ref 70–99)
Glucose-Capillary: 223 mg/dL — ABNORMAL HIGH (ref 70–99)
Glucose-Capillary: 282 mg/dL — ABNORMAL HIGH (ref 70–99)

## 2023-10-13 LAB — CBC
HCT: 36.7 % — ABNORMAL LOW (ref 39.0–52.0)
Hemoglobin: 12.3 g/dL — ABNORMAL LOW (ref 13.0–17.0)
MCH: 28.7 pg (ref 26.0–34.0)
MCHC: 33.5 g/dL (ref 30.0–36.0)
MCV: 85.7 fL (ref 80.0–100.0)
Platelets: 199 10*3/uL (ref 150–400)
RBC: 4.28 MIL/uL (ref 4.22–5.81)
RDW: 12.9 % (ref 11.5–15.5)
WBC: 10.6 10*3/uL — ABNORMAL HIGH (ref 4.0–10.5)
nRBC: 0 % (ref 0.0–0.2)

## 2023-10-13 MED ORDER — SODIUM CHLORIDE 0.9 % IV SOLN: INTRAVENOUS | Status: AC

## 2023-10-13 MED ORDER — INSULIN GLARGINE-YFGN 100 UNIT/ML ~~LOC~~ SOLN
18.0000 [IU] | Freq: Every day | SUBCUTANEOUS | Status: DC
Start: 2023-10-14 — End: 2023-10-14
  Administered 2023-10-14: 18 [IU] via SUBCUTANEOUS
  Filled 2023-10-13: qty 0.18

## 2023-10-13 NOTE — Plan of Care (Signed)
  Problem: Metabolic: Goal: Ability to maintain appropriate glucose levels will improve Outcome: Progressing   Problem: Nutritional: Goal: Maintenance of adequate nutrition will improve Outcome: Progressing   Problem: Education: Goal: Knowledge of General Education information will improve Description: Including pain rating scale, medication(s)/side effects and non-pharmacologic comfort measures Outcome: Progressing   Problem: Health Behavior/Discharge Planning: Goal: Ability to manage health-related needs will improve Outcome: Progressing

## 2023-10-13 NOTE — Progress Notes (Signed)
 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 Known HIV on [place] now on [place] with prior history of syphilis Chlamydia Previous anisocoria Prior episodic diverticulitis requiring hospitalization 2016  Bipolar  Newly diagnosed at office visit 10/08/2023 by PCP started metformin Mounjaro and referred to diabetic coordinator-also recent changes to HTN meds as switched to olmesartan HCTZ and Lasix-- because of feeling poorly dizzy nausea he had labs showing CBD of 770 was placed on insulin G TT sent home on metformin He returned with nausea decreased appetite Brought to ED secondary to sugars in the 500s on 4/11 Admitted and found to have AKI-BUN/creatinine 30/1.9 above baseline of 18/1.3, anion gap 15, sodium 131  WBC 14 hemoglobin 14- Lipase normal No imaging   Plan  New onset DM TY 2 A1c >10--hyperosmolar nonketotic state on admission Will require insulin-currently Semglee insulin 15 units 3 times daily meal coverage moderate with at bedtime CBGs are now ranging 160-230 He is eating 100% of meals We will adjust in the next day or so-May benefit from continuous glucometer Patient to administer to himself insulin and diabetic coordinator input is pending  ATN secondary to volume depletion and hyponatremia osmotic diuresis from diabetic state Patient labs have improved-continueNS 75 cc/H and trend labs to normal  HIV previously well-controlled-last viral load undetectable and CD4 1023 Outpatient follow-up with [facility]-continue Biktarvy Continue statin 10 mg daily  Bipolar Continue Abilify 5 mg Wellbutrin XL 150 Lexapro 20   DVT prophylaxis: Lovenox  Status is: Inpatient Remains inpatient appropriate because: Needs insulin teaching etc.      Subjective: Awake coherent no  distress looks fair no chest pain no fever chills nausea vomiting  Objective + exam Vitals:   10/12/23 1628 10/12/23 1940 10/13/23 0017 10/13/23 0359  BP: (!) 153/94 (!) 130/94 127/78 134/77  Pulse: 85 80 76 70  Resp: 17 17 18 18   Temp: 97.7 F (36.5 C) 97.9 F (36.6 C) 97.9 F (36.6 C) 97.9 F (36.6 C)  TempSrc: Oral Oral Oral Oral  SpO2: 94% 96% 94% 95%  Weight:      Height:       Filed Weights   10/11/23 1703  Weight: 111.8 kg    Examination: EOMI NCAT no focal deficit no icterus no pallor no wheeze rales S1-S2 no murmur no rub no gallop Abdomen soft no rebound no guarding ROM intact no focal deficit No lower extremity edema Power 5/5 euthymic coherent  Data Reviewed: reviewed   CBC    Component Value Date/Time   WBC 13.2 (H) 10/12/2023 0749   RBC 4.83 10/12/2023 0749   HGB 13.7 10/12/2023 0749   HCT 42.1 10/12/2023 0749   PLT 241 10/12/2023 0749   MCV 87.2 10/12/2023 0749   MCH 28.4 10/12/2023 0749   MCHC 32.5 10/12/2023 0749   RDW 13.2 10/12/2023 0749   LYMPHSABS 3.5 10/12/2023 0749   MONOABS 1.1 (H) 10/12/2023 0749   EOSABS 0.0 10/12/2023 0749   BASOSABS 0.0 10/12/2023 0749      Latest Ref Rng & Units 10/12/2023    7:49 AM 10/12/2023   12:17 AM 10/11/2023   11:55 PM  CMP  Glucose 70 - 99 mg/dL 811  914    BUN 6 - 20 mg/dL 34  39  Creatinine 0.61 - 1.24 mg/dL 1.61  0.96    Sodium 045 - 145 mmol/L 131  129  129   Potassium 3.5 - 5.1 mmol/L 4.2  4.5  4.5   Chloride 98 - 111 mmol/L 92  90    CO2 22 - 32 mmol/L 25     Calcium 8.9 - 10.3 mg/dL 8.7       Scheduled Meds:  ARIPiprazole  5 mg Oral Daily   bictegravir-emtricitabine-tenofovir AF  1 tablet Oral Daily   buPROPion  150 mg Oral Daily   enoxaparin (LOVENOX) injection  40 mg Subcutaneous Q24H   escitalopram  20 mg Oral Daily   finasteride  5 mg Oral Daily   insulin aspart  0-15 Units Subcutaneous TID WC   insulin aspart  0-5 Units Subcutaneous QHS   insulin glargine-yfgn  15 Units  Subcutaneous Daily   living well with diabetes book   Does not apply Once   pantoprazole  40 mg Oral Daily   rosuvastatin  10 mg Oral Daily   sodium chloride flush  3 mL Intravenous Q12H   Continuous Infusions:  Time 26  [name] Aaryan Essman, MD  [facility]

## 2023-10-13 NOTE — Progress Notes (Signed)
 Pt was able to administer insulin in his abdomen area with this nurse. Pt feels confident at this time. 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 I suggested that he administer insulin again before he leaves to guarantee he is comfortable wit the task. Will endorse to the following nurse that patient needs to give himself insulin.

## 2023-10-13 NOTE — Plan of Care (Signed)
  Problem: Metabolic: Goal: Ability to maintain appropriate glucose levels will improve Outcome: Progressing   Problem: Nutritional: Goal: Maintenance of adequate nutrition will improve Outcome: Progressing   Problem: Skin Integrity: Goal: Risk for impaired skin integrity will decrease Outcome: Progressing   Problem: Clinical Measurements: Goal: Respiratory complications will improve Outcome: Progressing   Problem: Nutrition: Goal: Adequate nutrition will be maintained Outcome: Progressing

## 2023-10-13 NOTE — Inpatient Diabetes Management (Signed)
 Inpatient Diabetes Program Recommendations  AACE/ADA: New Consensus Statement on Inpatient Glycemic Control (2015)  Target Ranges:  Prepandial:   less than 140 mg/dL      Peak postprandial:   less than 180 mg/dL (1-2 hours)      Critically ill patients:  140 - 180 mg/dL   Lab Results  Component Value Date   GLUCAP 164 (H) 10/13/2023   HGBA1C 10.3 (A) 10/08/2023    Inpatient Diabetes Program Recommendations:    If postprandials elevated today, please consider:  Novolog 4 units TID with meals if he consumes at least 50%.  Will continue to follow while inpatient.  Thank you, Hays Lipschutz, MSN, CDCES Diabetes Coordinator Inpatient Diabetes Program [phone] (team pager from 8a-5p)

## 2023-10-14 ENCOUNTER — Telehealth (HOSPITAL_COMMUNITY): Payer: Self-pay | Admitting: Pharmacy Technician

## 2023-10-14 ENCOUNTER — Other Ambulatory Visit (HOSPITAL_COMMUNITY): Payer: Self-pay

## 2023-10-14 ENCOUNTER — Encounter: Payer: Self-pay | Admitting: Family Medicine

## 2023-10-14 ENCOUNTER — Telehealth (HOSPITAL_COMMUNITY): Payer: Self-pay

## 2023-10-14 ENCOUNTER — Telehealth: Payer: Self-pay

## 2023-10-14 DIAGNOSIS — E1165 Type 2 diabetes mellitus with hyperglycemia: Secondary | ICD-10-CM | POA: Diagnosis not present

## 2023-10-14 LAB — GLUCOSE, CAPILLARY
Glucose-Capillary: 209 mg/dL — ABNORMAL HIGH (ref 70–99)
Glucose-Capillary: 183 mg/dL — ABNORMAL HIGH (ref 70–99)

## 2023-10-14 LAB — BASIC METABOLIC PANEL WITH GFR
Anion gap: 5 (ref 5–15)
BUN: 17 mg/dL (ref 6–20)
CO2: 27 mmol/L (ref 22–32)
Calcium: 9.2 mg/dL (ref 8.9–10.3)
Chloride: 106 mmol/L (ref 98–111)
Creatinine, Ser: 1.22 mg/dL (ref 0.61–1.24)
GFR, Estimated: >60 mL/min (ref >60)
Glucose, Bld: 216 mg/dL — ABNORMAL HIGH (ref 70–99)
Potassium: 3.8 mmol/L (ref 3.5–5.1)
Sodium: 138 mmol/L (ref 135–145)

## 2023-10-14 MED ORDER — OZEMPIC (0.25 OR 0.5 MG/DOSE) 2 MG/3ML ~~LOC~~ SOPN
1.0000 | PEN_INJECTOR | SUBCUTANEOUS | 1 refills | Status: DC
  Filled 2023-10-14: qty 3, 28d supply, fill #0
  Filled 2023-10-14: qty 3, fill #0
  Filled 2023-10-15: qty 3, 28d supply, fill #0
  Filled 2023-11-02 – 2023-11-06 (×3): qty 3, 28d supply, fill #1

## 2023-10-14 MED ORDER — FREESTYLE LIBRE 3 SENSOR MISC
1.0000 | Freq: Every day | 4 refills | Status: DC
  Filled 2023-10-14: qty 1, 14d supply, fill #0
  Filled 2023-10-14: qty 2, 28d supply, fill #0

## 2023-10-14 MED ORDER — LANTUS SOLOSTAR 100 UNIT/ML ~~LOC~~ SOPN
18.0000 [IU] | PEN_INJECTOR | Freq: Every day | SUBCUTANEOUS | 11 refills | Status: DC
  Filled 2023-10-14: qty 6, 33d supply, fill #0

## 2023-10-14 MED ORDER — INSULIN LISPRO (1 UNIT DIAL) 100 UNIT/ML (KWIKPEN)
5.0000 [IU] | PEN_INJECTOR | Freq: Three times a day (TID) | SUBCUTANEOUS | 1 refills | Status: DC
  Filled 2023-10-14: qty 6, 40d supply, fill #0

## 2023-10-14 MED ORDER — INSULIN PEN NEEDLE 32G X 4 MM MISC
0 refills | Status: DC
  Filled 2023-10-14: qty 100, 25d supply, fill #0

## 2023-10-14 MED ORDER — METFORMIN HCL 500 MG PO TABS
500.0000 mg | ORAL_TABLET | Freq: Two times a day (BID) | ORAL | 2 refills | Status: DC
  Filled 2023-10-14 – 2023-10-15 (×2): qty 60, 30d supply, fill #0

## 2023-10-14 MED ORDER — TIRZEPATIDE 2.5 MG/0.5ML ~~LOC~~ SOAJ
2.5000 mg | SUBCUTANEOUS | 0 refills | Status: DC
Start: 2023-10-14 — End: 2023-10-14
  Filled 2023-10-14: qty 2, 28d supply, fill #0

## 2023-10-14 NOTE — Inpatient Diabetes Management (Addendum)
 Inpatient Diabetes Program Recommendations  AACE/ADA: New Consensus Statement on Inpatient Glycemic Control   Target Ranges:  Prepandial:   less than 140 mg/dL      Peak postprandial:   less than 180 mg/dL (1-2 hours)      Critically ill patients:  140 - 180 mg/dL    Latest Reference Range & Units 10/13/23 07:55 10/13/23 11:59 10/13/23 16:55 10/13/23 20:00 10/14/23 08:35  Glucose-Capillary 70 - 99 mg/dL 409 (H) 811 (H) 914 (H) 282 (H) 209 (H)    Latest Reference Range & Units 10/08/23 11:21  C-Peptide 0.80 - 3.85 ng/mL 2.87    Latest Reference Range & Units 10/08/23 11:09  Hemoglobin A1C 4.0 - 5.6 % -  10.3 ! Pend   Review of Glycemic Control  Diabetes history: DM2 (dx 10/08/23) Outpatient Diabetes medications: Metformin 500 mg BID, Mounjaro 2.5 mg Qweek (not started yet) Current orders for Inpatient glycemic control: Semglee 18 units daily, Novolog 0-15 units TID with meals, Novolog 0-5 units QHS  Inpatient Diabetes Program Recommendations:    Insulin: Please consider ordering Novolog 5 units TID with meals for meal coverage if patient eats at least 50% of meals.  Outpatient DM: Please provide Rx for Lantus Pens (#[id number]), Humalog pens (#[id number]), insulin pen needles (#[id number]), FreeStyle Libre 3 sensors [phone]).  Addendum 10/14/23@11 :30-Spoke with patient at bedside regarding DM, insulin, and CGM. 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 Patient states he gave himself a few injections here and he is getting use to the idea of self injecting insulin. Patient states that he started taking the Metformin when prescribed and it has caused nausea and vomiting but his PCP told him it may cause GI issues and if he keeps taking it that hopefully the symptoms will decrease. Patient states he has a follow up appointment with his PCP on May 9th. Discussed that since he will be discharging new to insulin, it would be  recommended that he see his PCP in 1-2 weeks so insulin could  be adjusted if needed. Patient states that his PCP prescribed Mounjaro but it has to be mail ordered so he has not received it yet. Explained that if he starts taking the [facility] that his insulin may need to be decreased. Asked that he reach out to his PCP before he starts taking the [facility] to ask about insulin dosages. Reviewed hypoglycemia along with treatment.  Educated patient on insulin pen use at home. Reviewed all steps of insulin pen including attachment of needle, 2-unit air shot, dialing up dose, giving injection, removing needle, disposal of sharps, storage of unused insulin, disposal of insulin etc. Patient able to provide successful return demonstration. Educated patient on FreeStyle Libre3 CGM regarding application and changing CGM sensor (alternate every 14 days on back of arms), 1 hour warm-up, how to scan sensor to start a new sensor, and how to use app to check glucose.  Patient given Freestyle Libre3 sensor samples (2).  Provided educational packet regarding FreeStyle Libre3 CGM.  Patient plans to use iphone FreeStyle Libre3 app to read FreeStyle Libre sensor and was able to download the app and create an account. Patient was able to apply sensor to back of upper right arm without any issues.  Asked patient to be sure to let PCP know about Radene Journey and allow provider to review reports from [place] app so the provider can use the information to continue to make  adjustments with DM medications if needed. Patient verbalized understanding of information and has no questions at this time.  Thanks, Beacher Limerick, RN, MSN, CDCES Diabetes Coordinator Inpatient Diabetes Program [phone] (Team Pager from 8am to 5pm)

## 2023-10-14 NOTE — Telephone Encounter (Signed)
 Pharmacy Patient Advocate Encounter   Received notification from Inpatient Request that prior authorization for Ozempic (0.25 or 0.5 MG/DOSE) 2MG /3ML pen-injectors is required/requested.   Insurance verification completed.   The patient is insured through [facility] .   Per test claim: PA required; PA submitted to above mentioned insurance via CoverMyMeds Key/confirmation #/EOC [id number] Status is pending

## 2023-10-14 NOTE — Progress Notes (Signed)
 Complex Care Management Note  Care Guide Note 10/14/2023 Name: Phillip Gill MRN: [id number] DOB: 30-Dec-1975  Phillip Gill is a 48 y.o. year old male who sees Catheryn Cluck, MD for primary care. I reached out to Phillip Gill by phone today to offer complex care management services.  [name] was given information about Complex Care Management services today including:   The Complex Care Management services include support from the care team which includes your Nurse Care Manager, Clinical Social Worker, or Pharmacist.  The Complex Care Management team is here to help remove barriers to the health concerns and goals most important to you. Complex Care Management services are voluntary, and the patient may decline or stop services at any time by request to their care team member.   Complex Care Management Consent Status: Patient agreed to services and verbal consent obtained.   Follow up plan:  Telephone appointment with complex care management team member scheduled for:  10/29/23 at 10:30 a.m.   Encounter Outcome:  Patient Scheduled  [facility]  [facility], [facility] Care Management Assistant Direct Dial: [phone]  Fax: [phone]

## 2023-10-14 NOTE — Progress Notes (Signed)
 Explained discharge instructions to patient. Reviewed follow up appointment and next medication administration times. Also reviewed education. Patient verbalized having an understanding for instructions given. All belongings are in the patient's possession. Will wait for TOC meds in the discharge lounge. IV was removed removed by his nurse.  No other needs verbalized. Transporting downstairs to the discharge lounge to await meds.

## 2023-10-14 NOTE — Plan of Care (Signed)

## 2023-10-14 NOTE — Telephone Encounter (Signed)
 Pharmacy Patient Advocate Encounter   Received notification from Inpatient Request that prior authorization for FreeStyle Libre 3 Sensor is required/requested.   Insurance verification completed.   The patient is insured through [facility] .   Per test claim: PA required; PA submitted to above mentioned insurance via CoverMyMeds Key/confirmation #/EOC [id number] Status is pending

## 2023-10-14 NOTE — Telephone Encounter (Signed)
 Pharmacy Patient Advocate Encounter  Insurance verification completed.    The patient is insured through [facility].     Ran test claim for LANTUS SOLOSTAR and the current 30 day co-pay is $4.00.  Ran test claim for HUMALOG KWIKPEN and the current 30 day co-pay is $4.00.  Ran test claim for FREESTYLE LIBRE 3 SENSORS and prior authorization is required.   This test claim was processed through [facility]- copay amounts may vary at other pharmacies due to pharmacy/plan contracts, or as the patient moves through the different stages of their insurance plan.

## 2023-10-14 NOTE — Discharge Summary (Addendum)
 Physician Discharge Summary  Phillip Gill ZOX:[id number] DOB: June 28, 1976 DOA: 10/11/2023  PCP: Garnette Gunner, MD  Admit date: 10/11/2023 Discharge date: 10/14/2023  Time spent: 26 minutes  Recommendations for Outpatient Follow-up:  Recommend short-term insulin usage and following A1c-May be able to titrate metformin in the outpatient setting (had not started this hospital stay) and come off insulin if motivated Recommend Chem-7 1 week time and resumption of diuretics antihypertensives as an outpatient if felt reasonable by PCP Outpatient follow-up with [facility] physician with regards to underlying HIV which is relatively well-controlled  Discharge Diagnoses:  MAIN problem for hospitalization   Hyperosmolar nonketotic state  Please see below for itemized issues addressed in HOpsital- refer to other progress notes for clarity if needed  Discharge Condition: Improved  Diet recommendation: Diabetic  Filed Weights   10/11/23 1703  Weight: 111.8 kg    History of present illness:  48 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 Known HIV on [place] now on [place] with prior history of syphilis Chlamydia Previous anisocoria Prior episodic diverticulitis requiring hospitalization 2016  Bipolar   Newly diagnosed at office visit 10/08/2023 by PCP started metformin Mounjaro and referred to diabetic coordinator-also recent changes to HTN meds as switched to olmesartan HCTZ and Lasix-- because of feeling poorly dizzy nausea he had labs showing CBD of 770 was placed on insulin G TT sent home on metformin He returned with nausea decreased appetite Brought to ED secondary to sugars in the 500s on 4/11 Admitted and found to have AKI-BUN/creatinine 30/1.9 above baseline of 18/1.3, anion gap 15, sodium 131  WBC 14 hemoglobin 14- Lipase normal No imaging     Plan   New onset DM TY 2 A1c >10--hyperosmolar nonketotic state on admission Will require insulin-currently Semglee insulin 15 units 3 times  daily meal coverage moderate with at bedtime CBGs are now ranging 160-230 He is eating 100% of meals Will be adding on 5 units of insulin 3 times a day and adjustments further as an outpatient We will try to obtain continuous glucometer with sensor etc. so that patient does not need to fingerstick but patient may have to do so if this is not approved We will resume metformin that he had never started and this can be titrated as an outpatient We switched to OZempic as more affrodable and will [facility]  ATN secondary to volume depletion and hyponatremia osmotic diuresis from diabetic state Patient labs have improved-his kidney function improved close to baseline and the normal saline was stopped   HIV previously well-controlled-last viral load undetectable and CD4 1023 Outpatient follow-up with [facility]-continue Biktarvy Continue statin 10 mg daily   Bipolar Continue Abilify 5 mg Wellbutrin XL 150 Lexapro 20   Discharge Exam: Vitals:   10/14/23 0500 10/14/23 0906  BP: (!) 150/93 (!) 158/100  Pulse: (!) 58 66  Resp: 18 17  Temp: 98.5 F (36.9 C) 98.1 F (36.7 C)  SpO2: 98% 96%    Subj on day of d/c   Awake coherent no distress  General Exam on discharge  EOMI NCAT Chest clear no wheeze Abdomen soft no rebound ROM intact  Discharge Instructions   Discharge Instructions     Diet - low sodium heart healthy   Complete by: As directed    Discharge instructions   Complete by: As directed    Stop your blood pressure meds temporarily as your kidney function was altered on admission and you will need labs before you resume  it in the outpatient setting We will try to obtain a glucose freestyle monitor otherwise you will need to use a regular meter We have placed you on 2 types of insulin insulin lispro which is short acting 5 units 3 times a day and then a long-acting insulin 18 units daily Please keep a close check on this Please continue your other  medications without change-I will get the metformin and hopefully the [facility] for you here at this hospital and everything will be delivered to you You may return to work tomorrow and I will give you a work note   Increase activity slowly   Complete by: As directed       Allergies as of 10/14/2023       Reactions   Bactrim [sulfamethoxazole W/trimethoprim (co-trimoxazole)] Hives, Shortness Of Breath   Sulfa Antibiotics Hives, Shortness Of Breath   Cymbalta [duloxetine Hcl] Diarrhea   Duloxetine Diarrhea   Other Hives, Swelling   Colgate toothpaste    Pork-derived Products Other (See Comments)   Pt refused to eat pork products.   Zoloft [sertraline Hcl] Diarrhea   Clindamycin/lincomycin Rash   Neurontin [gabapentin] Rash        Medication List     STOP taking these medications    furosemide 20 MG tablet Commonly known as: LASIX   olmesartan-hydrochlorothiazide 40-25 MG tablet Commonly known as: BENICAR HCT   tirzepatide 2.5 MG/0.5ML Pen Commonly known as: MOUNJARO       TAKE these medications    Accu-Chek Guide Me w/Device Kit Use to check blood sugar in the morning, at noon, and at bedtime.   Accu-Chek Guide Test test strip Generic drug: glucose blood 1 each by In Vitro route in the morning, at noon, and at bedtime. May substitute to any manufacturer covered by patient's insurance.   Accu-Chek Softclix Lancets lancets Use to check blood sugar in the morning, at noon, and at bedtime.   ARIPiprazole 5 MG tablet Commonly known as: Abilify Take 1 tablet (5 mg total) by mouth daily.   Biktarvy 50-200-25 MG Tabs tablet Generic drug: bictegravir-emtricitabine-tenofovir AF TAKE 1 TABLET BY MOUTH EVERY DAY   buPROPion 150 MG 24 hr tablet Commonly known as: Wellbutrin XL Take 1 tablet (150 mg total) by mouth daily.   Carpal Tunnel Wrist Stabilizer Misc 1 each by Does not apply route as needed.   Medical Compression Stockings Misc 1 each by Does not apply  route daily.   diclofenac Sodium 1 % Gel Commonly known as: VOLTAREN Apply 4 grams topically 4 (four) times daily to affected joint.   escitalopram 20 MG tablet Commonly known as: Lexapro Take 1 tablet (20 mg total) by mouth daily.   esomeprazole 40 MG capsule Commonly known as: NexIUM Take 1 capsule (40 mg total) by mouth 2 (two) times daily before a meal. What changed:  when to take this reasons to take this   finasteride 5 MG tablet Commonly known as: Proscar Take 1 tablet (5 mg total) by mouth daily.   FreeStyle Libre 3 Sensor Misc Place 1 sensor on the skin every 14 days. Use to check glucose continuously   HumaLOG KwikPen 100 UNIT/ML KwikPen Generic drug: insulin lispro Inject 5 Units into the skin 3 (three) times daily.   Lancet Device Misc 1 each by Does not apply route in the morning, at noon, and at bedtime. May substitute to any manufacturer covered by patient's insurance.   Lantus SoloStar 100 UNIT/ML Solostar Pen Generic drug: insulin glargine Inject  18 Units into the skin daily.   metFORMIN 500 MG tablet Commonly known as: GLUCOPHAGE Take 1 tablet (500 mg total) by mouth 2 (two) times daily with a meal.   Ozempic (0.25 or 0.5 MG/DOSE) 2 MG/3ML Sopn Generic drug: Semaglutide(0.25 or 0.5MG /DOS) Inject 1 Dose into the skin once a week. 0.25mg  SQ qweek x 4 weeks then 0.5mg  SQ qweek x 2 doses for starting pen (2mg /15ml pen with 6 pentips)   rosuvastatin 10 MG tablet Commonly known as: Crestor Take 1 tablet (10 mg total) by mouth daily.   tadalafil 10 MG tablet Commonly known as: CIALIS Take 0.5 tablets (5 mg total) by mouth daily.   TechLite Plus Pen Needles 32G X 4 MM Misc Generic drug: Insulin Pen Needle Use as directed with insulin   Vitamin D (Ergocalciferol) 1.25 MG (50000 UNIT) Caps capsule Commonly known as: DRISDOL Take 1 capsule (50,000 Units total) by mouth every 7 (seven) days.       Allergies  Allergen Reactions   Bactrim  [Sulfamethoxazole W/Trimethoprim (Co-Trimoxazole)] Hives and Shortness Of Breath   Sulfa Antibiotics Hives and Shortness Of Breath   Cymbalta [Duloxetine Hcl] Diarrhea   Duloxetine Diarrhea   Other Hives and Swelling    Colgate toothpaste    Pork-Derived Products Other (See Comments)    Pt refused to eat pork products.   Zoloft [Sertraline Hcl] Diarrhea   Clindamycin/Lincomycin Rash   Neurontin [Gabapentin] Rash      The results of significant diagnostics from this hospitalization (including imaging, microbiology, ancillary and laboratory) are listed below for reference.    Significant Diagnostic Studies: No results found.  Microbiology: No results found for this or any previous visit (from the past 240 hours).   Labs: Basic Metabolic Panel: Recent Labs  Lab 10/08/23 1121 10/11/23 1712 10/11/23 2355 10/12/23 0017 10/12/23 0749 10/13/23 0903 10/14/23 0309  NA 130* 131* 129* 129* 131* 136 138  K 4.4 3.6 4.5 4.5 4.2 3.9 3.8  CL 94* 91*  --  90* 92* 103 106  CO2 27 25  --   --  25 25 27   GLUCOSE 523* 450*  --  678* 476* 234* 216*  BUN 18 30*  --  39* 34* 22* 17  CREATININE 1.31 1.92*  --  2.40* 1.90* 1.34* 1.22  CALCIUM 9.1 9.4  --   --  8.7* 8.8* 9.2   Liver Function Tests: Recent Labs  Lab 10/11/23 1712  AST 20  ALT 20  ALKPHOS 97  BILITOT 1.0  PROT 8.7*  ALBUMIN 4.2   Recent Labs  Lab 10/11/23 1712  LIPASE 40   No results for input(s): "AMMONIA" in the last 168 hours. CBC: Recent Labs  Lab 10/07/23 1818 10/07/23 1820 10/11/23 1712 10/11/23 2355 10/12/23 0017 10/12/23 0749 10/13/23 0903  WBC 12.8*  --  14.7*  --   --  13.2* 10.6*  NEUTROABS  --   --   --   --   --  8.5*  --   HGB 13.7   < > 14.9 15.6 16.0 13.7 12.3*  HCT 39.3   < > 44.4 46.0 47.0 42.1 36.7*  MCV 83.1  --  84.9  --   --  87.2 85.7  PLT 230  --  263  --   --  241 199   < > = values in this interval not displayed.   Cardiac Enzymes: No results for input(s): "CKTOTAL", "CKMB",  "CKMBINDEX", "TROPONINI" in the last 168 hours. BNP: BNP (last  3 results) No results for input(s): "BNP" in the last 8760 hours.  ProBNP (last 3 results) Recent Labs    09/19/23 0946  PROBNP 18.0    CBG: Recent Labs  Lab 10/13/23 0755 10/13/23 1159 10/13/23 1655 10/13/23 2000 10/14/23 0835  GLUCAP 164* 268* 223* 282* 209*    Signed:  Verlie Glisson MD   [facility] 10/14/2023, 11:48 AM

## 2023-10-14 NOTE — Plan of Care (Signed)
  Problem: Education: Goal: Ability to describe self-care measures that may prevent or decrease complications (Diabetes Survival Skills Education) will improve Outcome: Progressing   Problem: Nutritional: Goal: Maintenance of adequate nutrition will improve Outcome: Progressing   Problem: Skin Integrity: Goal: Risk for impaired skin integrity will decrease Outcome: Progressing   Problem: Activity: Goal: Risk for activity intolerance will decrease Outcome: Progressing   Problem: Nutrition: Goal: Adequate nutrition will be maintained Outcome: Progressing   Problem: Coping: Goal: Level of anxiety will decrease Outcome: Progressing   Problem: Pain Managment: Goal: General experience of comfort will improve and/or be controlled Outcome: Progressing   Problem: Safety: Goal: Ability to remain free from injury will improve Outcome: Progressing

## 2023-10-14 NOTE — Telephone Encounter (Signed)
 Pharmacy Patient Advocate Encounter   Received notification from Inpatient Request that prior authorization for [id number] is required/requested.   Insurance verification completed.   The patient is insured through [facility] .   Per test claim:  BYETTA,TRULICITY,VICTOZA,OZEMPIC is preferred by the insurance.  If suggested medication is appropriate, Please send in a new RX and discontinue this one. If not, please advise as to why it's not appropriate so that we may request a Prior Authorization. Please note, some preferred medications may still require a PA.  If the suggested medications have not been trialed and there are no contraindications to their use, the PA will not be submitted, as it will not be approved.

## 2023-10-15 ENCOUNTER — Other Ambulatory Visit (HOSPITAL_COMMUNITY): Payer: Self-pay

## 2023-10-15 ENCOUNTER — Telehealth: Payer: Self-pay

## 2023-10-15 ENCOUNTER — Telehealth: Payer: Self-pay | Admitting: Family Medicine

## 2023-10-15 NOTE — Telephone Encounter (Signed)
 Pharmacy Patient Advocate Encounter  Received notification from [facility] that Prior Authorization for Ozempic (0.25 or 0.5 MG/DOSE) 2MG /3ML pen-injectors  has been APPROVED from 10/14/2023 to 10/14/2024   PA #/Case ID/Reference #: [id number]

## 2023-10-15 NOTE — Transitions of Care (Post Inpatient/ED Visit) (Signed)
 10/15/2023  Name: TEVITA GOMER MRN: [id number] DOB: 1975-12-14  Today's TOC FU Call Status: Today's TOC FU Call Status:: Successful TOC FU Call Completed TOC FU Call Complete Date: 10/15/23 Patient's Name and Date of Birth confirmed.  Transition Care Management Follow-up Telephone Call Date of Discharge: 10/14/23 Discharge Facility: [facility]) Type of Discharge: Emergency Department Reason for ED Visit: Other: (hyperglycemia) How have you been since you were released from the hospital?: Same Any questions or concerns?: Yes Patient Questions/Concerns:: Pt voiced concerns with [facility] Patient Questions/Concerns Addressed: Provided Patient Educational Materials  Items Reviewed: Did you receive and understand the discharge instructions provided?: Yes Medications obtained,verified, and reconciled?: Yes (Medications Reviewed) Any new allergies since your discharge?: No Dietary orders reviewed?: NA Do you have support at home?: No  Medications Reviewed Today: Medications Reviewed Today     Reviewed by Leroy Kennedy, CMA (Certified Medical Assistant) on 10/15/23 at 1121  Med List Status: <None>   Medication Order Taking? Sig Documenting Provider Last Dose Status Informant  Accu-Chek Softclix Lancets lancets [id number] Yes Use to check blood sugar in the morning, at noon, and at bedtime. Garnette Gunner, MD Taking Active Self  ARIPiprazole (ABILIFY) 5 MG tablet [id number] Yes Take 1 tablet (5 mg total) by mouth daily. Park Pope, MD Taking Active Self  bictegravir-emtricitabine-tenofovir AF (BIKTARVY) 50-200-25 MG TABS tablet [id number] Yes TAKE 1 TABLET BY MOUTH EVERY DAY Veryl Speak, FNP Taking Active Self  Blood Glucose Monitoring Suppl (BLOOD GLUCOSE MONITOR SYSTEM) w/Device KIT [id number] Yes Use to check blood sugar in the morning, at noon, and at bedtime. Garnette Gunner, MD Taking Active Self  buPROPion (WELLBUTRIN XL) 150 MG 24 hr tablet [id number] Yes Take 1  tablet (150 mg total) by mouth daily. Park Pope, MD Taking Active Self  Continuous Glucose Sensor (FREESTYLE LIBRE 3 SENSOR) [place] [id number] Yes Place 1 sensor on the skin every 14 days. Use to check glucose continuously Rhetta Mura, MD Taking Active   diclofenac Sodium (VOLTAREN) 1 % GEL [id number] Yes Apply 4 grams topically 4 (four) times daily to affected joint. Rodolph Bong, MD Taking Active Self  Elastic Bandages & Supports (CARPAL TUNNEL WRIST STABILIZER) MISC [id number] Yes 1 each by Does not apply route as needed. Garnette Gunner, MD Taking Active Self  Elastic Bandages & Supports (MEDICAL COMPRESSION STOCKINGS) [place] [id number] Yes 1 each by Does not apply route daily. Garnette Gunner, MD Taking Active Self  escitalopram (LEXAPRO) 20 MG tablet [id number] Yes Take 1 tablet (20 mg total) by mouth daily. Park Pope, MD Taking Active Self  esomeprazole (NEXIUM) 40 MG capsule [id number] Yes Take 1 capsule (40 mg total) by mouth 2 (two) times daily before a meal.  Patient taking differently: Take 40 mg by mouth daily as needed (for acid reflux).   Hilarie Fredrickson, MD Taking Active Self  finasteride (PROSCAR) 5 MG tablet [id number] Yes Take 1 tablet (5 mg total) by mouth daily. Garnette Gunner, MD Taking Active Self  Glucose Blood (BLOOD GLUCOSE TEST STRIPS) STRP [id number] Yes 1 each by In Vitro route in the morning, at noon, and at bedtime. May substitute to any manufacturer covered by patient's insurance. Garnette Gunner, MD Taking Active Self  insulin glargine (LANTUS SOLOSTAR) 100 UNIT/ML Solostar Pen [id number] Yes Inject 18 Units into the skin daily. Rhetta Mura, MD Taking Active   insulin lispro (HUMALOG) 100 UNIT/ML KwikPen [id number] Yes Inject 5 Units into the skin 3 (three) times daily.  [name], [name], MD Taking Active   Insulin Pen Needle 32G X 4 MM MISC [id number] Yes Use as directed with insulin [name], [name], MD Taking Active   Lancet Device MISC [id number]  Yes 1 each by Does not apply route in the morning, at noon, and at bedtime. May substitute to any manufacturer covered by patient's insurance. Catheryn Cluck, MD Taking Active Self  metFORMIN (GLUCOPHAGE) 500 MG tablet [id number] Yes Take 1 tablet (500 mg total) by mouth 2 (two) times daily with a meal. [name], [name], MD Taking Active   rosuvastatin (CRESTOR) 10 MG tablet [id number] Yes Take 1 tablet (10 mg total) by mouth daily. [name], [name], FNP Taking Active Self  Semaglutide,0.25 or 0.5MG /DOS, (OZEMPIC, 0.25 OR 0.5 MG/DOSE,) 2 MG/3ML SOPN [id number] Yes Inject 0.25mg  under the skin once weekly for 4 weeks then inject 0.5mg  unde the skin every week for 2 doses [name], [name], MD Taking Active   tadalafil (CIALIS) 10 MG tablet [id number] Yes Take 0.5 tablets (5 mg total) by mouth daily. Catheryn Cluck, MD Taking Active Self  Vitamin D, Ergocalciferol, (DRISDOL) 1.25 MG (50000 UNIT) CAPS capsule [id number] Yes Take 1 capsule (50,000 Units total) by mouth every 7 (seven) days. Augusta Blizzard, MD Taking Active Self            Home Care and Equipment/Supplies: Were Home Health Services Ordered?: NA Any new equipment or medical supplies ordered?: NA  Functional Questionnaire: Do you need assistance with bathing/showering or dressing?: No Do you need assistance with meal preparation?: No Do you need assistance with eating?: No Do you have difficulty maintaining continence: No Do you need assistance with getting out of bed/getting out of a chair/moving?: No Do you have difficulty managing or taking your medications?: No  Follow up appointments reviewed: PCP Follow-up appointment confirmed?: Yes Date of PCP follow-up appointment?: 10/22/23 Follow-up Provider: Dr. Hildy Lowers [facility] Follow-up appointment confirmed?: NA Do you need transportation to your follow-up appointment?: No Do you understand care options if your condition(s) worsen?: Yes-patient verbalized  understanding    SIGNATURE Kirby Peoples, RMA

## 2023-10-15 NOTE — Telephone Encounter (Signed)
 Pharmacy Patient Advocate Encounter  Received notification from [facility] that Prior Authorization for [facility] 3 Sensor has been APPROVED from 10/14/2023 to 04/14/2024   PA #/Case ID/Reference #: [id number]

## 2023-10-15 NOTE — Telephone Encounter (Signed)
 TOC completed on 10/15/2023

## 2023-10-15 NOTE — Telephone Encounter (Signed)
 Noted; will complete TOC

## 2023-10-15 NOTE — Telephone Encounter (Signed)
 Pt was admitted on 10/12/23 and discharged 10/14/2023. He is scheduled to be seen for a hospital follow up with PCP 10/22/2023.

## 2023-10-16 ENCOUNTER — Other Ambulatory Visit (HOSPITAL_COMMUNITY): Payer: Self-pay

## 2023-10-19 ENCOUNTER — Other Ambulatory Visit (HOSPITAL_COMMUNITY): Payer: Self-pay

## 2023-10-19 ENCOUNTER — Other Ambulatory Visit (HOSPITAL_BASED_OUTPATIENT_CLINIC_OR_DEPARTMENT_OTHER): Payer: Self-pay

## 2023-10-22 ENCOUNTER — Encounter: Payer: Self-pay | Admitting: Family Medicine

## 2023-10-22 ENCOUNTER — Other Ambulatory Visit (HOSPITAL_COMMUNITY): Payer: Self-pay

## 2023-10-22 ENCOUNTER — Ambulatory Visit (INDEPENDENT_AMBULATORY_CARE_PROVIDER_SITE_OTHER): Payer: MEDICAID | Admitting: Family Medicine

## 2023-10-22 VITALS — BP 128/86 | HR 79 | Temp 97.5°F | Wt 241.2 lb

## 2023-10-22 DIAGNOSIS — Z09 Encounter for follow-up examination after completed treatment for conditions other than malignant neoplasm: Secondary | ICD-10-CM | POA: Diagnosis not present

## 2023-10-22 DIAGNOSIS — Z7985 Long-term (current) use of injectable non-insulin antidiabetic drugs: Secondary | ICD-10-CM

## 2023-10-22 DIAGNOSIS — Z794 Long term (current) use of insulin: Secondary | ICD-10-CM

## 2023-10-22 DIAGNOSIS — N179 Acute kidney failure, unspecified: Secondary | ICD-10-CM

## 2023-10-22 DIAGNOSIS — E1165 Type 2 diabetes mellitus with hyperglycemia: Secondary | ICD-10-CM | POA: Diagnosis not present

## 2023-10-22 DIAGNOSIS — Z87448 Personal history of other diseases of urinary system: Secondary | ICD-10-CM

## 2023-10-22 MED ORDER — FREESTYLE LIBRE 3 SENSOR MISC
1.0000 | Freq: Every day | 4 refills | Status: DC
  Filled 2023-10-22 – 2023-11-11 (×2): qty 2, 28d supply, fill #0
  Filled 2023-11-30 – 2023-12-16 (×2): qty 2, 28d supply, fill #1
  Filled 2024-01-09: qty 2, 28d supply, fill #2
  Filled 2024-01-27 – 2024-01-30 (×2): qty 2, 28d supply, fill #3
  Filled 2024-02-23: qty 2, 28d supply, fill #4

## 2023-10-22 MED ORDER — LANTUS SOLOSTAR 100 UNIT/ML ~~LOC~~ SOPN: 10.0000 [IU] | PEN_INJECTOR | Freq: Every day | SUBCUTANEOUS | Status: DC

## 2023-10-22 MED ORDER — FREESTYLE LIBRE 3 SENSOR MISC
1.0000 | Freq: Every day | 4 refills | Status: DC
Start: 2023-10-22 — End: 2023-10-22
  Filled 2023-10-22: qty 2, 28d supply, fill #0

## 2023-10-22 NOTE — Progress Notes (Signed)
 Assessment/Plan:    Assessment & Plan Type 2 diabetes mellitus with hyperglycemia New onset diabetes diagnosed on October 07, 2023, with initial blood sugar of 782 mg/dL. Hospitalized from April 11 to October 14, 2023. Current blood sugar levels improved to 134-141 mg/dL. Initially on Lantus , Aspart, and metformin ; metformin  discontinued due to gastrointestinal side effects. Started on Ozempic  (semaglutide ) 0.25 mg on October 18, 2023, with initial vomiting episode but subsequent improvement. Weight decreased by 5-6 pounds, likely due to reduced appetite from Ozempic . Currently on Lantus  18 units daily and Ozempic  0.25 mg weekly. Not using Lispro recently due to improved control. Discussed reducing Lantus  to 10 units and potential discontinuation if control is maintained. - Continue Ozempic  0.25 mg weekly for 4 weeks, then increase to 0.5 mg if tolerated. - Reduce Lantus  to 10 units daily. - Discontinue Humalog  (insulin  Lispro). - Provide refills for [facility] sensors. - Refer to pharmacy team for medication management. - Connect with diabetic educator for nutritional services. - Monitor blood sugar levels regularly. - Follow up in 1 month.  Acute kidney injury, resolved Acute kidney injury secondary to uncontrolled diabetes has resolved. Kidney function tests show GFR greater than 60 mL/min/1.73 m. Microalbumin creatinine ratio was 76 mg/g, indicating mild proteinuria, but this has improved. No current need for repeat lab work as kidney function has stabilized. - Ensure adequate hydration. - Monitor kidney function and proteinuria.      There are no discontinued medications.  No follow-ups on file.    Subjective:   Encounter date: 10/22/2023  Phillip Gill is a 48 y.o. male who has Anxiety; Other constipation; Anxiety and depression; Fibromyalgia muscle pain; Anal condyloma; Gynecomastia; HTN (hypertension); Chronic hepatitis B (HCC); LGV (lymphogranuloma venereum); Primary  syphilis; Colitis; HIV (human immunodeficiency virus infection) (HCC); Tobacco abuse; Overweight; Chronic pain associated with significant psychosocial dysfunction; Gonorrhea; Human immunodeficiency virus (HIV) disease (HCC); Foot lesion; Hepatitis B surface antigen positive; Healthcare maintenance; Erectile dysfunction; Cervical pain; Human monkeypox; Encounter for physical examination related to employment; Snoring; Urinary frequency; Left foot pain; Poor social situation; MDD (major depressive disorder); Passive suicidal ideations; OSA on CPAP; MDD (major depressive disorder), recurrent severe, without psychosis (HCC); Gastroesophageal reflux disease; Neck mass; Nocturia; Carpal tunnel syndrome on both sides; Hyponatremia; AKI (acute kidney injury) (HCC); Type 2 diabetes mellitus with hyperglycemia (HCC); 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   He  has a past medical history of Anxiety, Arthritis, Chronic hepatitis B (HCC), Chronic lower back pain, Depression, Fibromyalgia, Genital warts, HIV disease (HCC) (02/28/2015), HIV infection (HCC), Hypertension, IBS (irritable bowel syndrome), Migraine, Overweight (07/20/2015), and Renal insufficiency (12/29/2013).Phillip Gill   He presents with chief complaint of Hospitalization Follow-up (Dx..Uncontrolled type 2 diabetes mellitus with hyperglycemia. 141 sugar levels right now. Since starting ozempic  pt hasn't needed to take insulin  ) .   Discussed the use of AI scribe software for clinical note transcription with the patient, who gave verbal consent to proceed.  History of Present Illness Phillip Gill "Phillip Gill" is a 48 year old male who presents for follow-up on blood sugar management.  He was diagnosed with new onset diabetes after presenting with a blood sugar level of 782 mg/dL and was admitted to [facility] from  April 11 to October 14, 2023. Since starting Ozempic  on October 18, 2023, his blood sugar has decreased to around 134-141 mg/dL. He feels 'okay' but still experiences some thirst and  reduced urination frequency.  Initially, he was on Lantus , Aspart, and Metformin , but Metformin  was discontinued due to gastrointestinal side effects, including nausea and vomiting. He is currently on Ozempic  0.25 mg weekly and Lantus  18 units daily. He has not used short-acting insulin  Lispro recently due to improved blood sugar levels. He reports a decrease in appetite and a weight loss of about 5-6 pounds since starting Ozempic .  He experienced one episode of vomiting after starting Ozempic  but has since improved. He has not used the [employer] insulin  in two days, and his blood sugars have been stable around the 130s. He last took Lantus  yesterday.  He uses a [employer] for glucose monitoring and has had issues with insurance coverage for refills. He has one sensor left and is awaiting more refills.  No significant gastrointestinal side effects from Ozempic  except for one vomiting episode. No recent use of Humalog  and stable blood sugar levels without it. His urine previously showed protein, but this has resolved. His kidney function has improved, with a GFR greater than 60, and his acute kidney injury has resolved.     Follow up Hospitalization   Telephone follow up was done on 10/14/2023 He reports good compliance with treatment. He reports this condition is improved.  ----------------------------------------------------------------------------------------- -   Past Surgical History:  Procedure Laterality Date   CO2 LASER APPLICATION N/A 02/11/2013   Procedure: CO2 LASER APPLICATION;  Surgeon: Joyce Nixon, MD;  Location: [facility];  Service: General;  Laterality: N/A;   FOOT SURGERY     HIGH RESOLUTION ANOSCOPY N/A 02/11/2013   Procedure: HIGH RESOLUTION ANOSCOPY WITH BIOPSY, LASER  ABLATION;  Surgeon: Joyce Nixon, MD;  Location: [facility];  Service: General;  Laterality: N/A;   WISDOM TOOTH EXTRACTION      Outpatient Medications Prior to Visit  Medication Sig Dispense Refill   Accu-Chek Softclix Lancets lancets Use to check blood sugar in the morning, at noon, and at bedtime. 100 each 0   ARIPiprazole  (ABILIFY ) 5 MG tablet Take 1 tablet (5 mg total) by mouth daily. 90 tablet 0   bictegravir-emtricitabine -tenofovir  AF (BIKTARVY ) 50-200-25 MG TABS tablet TAKE 1 TABLET BY MOUTH EVERY DAY 30 tablet 5   Blood Glucose Monitoring Suppl (BLOOD GLUCOSE MONITOR SYSTEM) w/Device KIT Use to check blood sugar in the morning, at noon, and at bedtime. 1 kit 0   buPROPion  (WELLBUTRIN  XL) 150 MG 24 hr tablet Take 1 tablet (150 mg total) by mouth daily. 90 tablet 0   Continuous Glucose Sensor (FREESTYLE LIBRE 3 SENSOR) MISC Place 1 sensor on the skin every 14 days. Use to check glucose continuously 2 each 4   diclofenac  Sodium (VOLTAREN ) 1 % GEL Apply 4 grams topically 4 (four) times daily to affected joint. 100 g 11   Elastic Bandages & Supports (CARPAL TUNNEL WRIST STABILIZER) MISC 1 each by Does not apply route as needed. 1 each 0   Elastic Bandages & Supports (MEDICAL COMPRESSION STOCKINGS) MISC 1 each by Does not apply route daily. 1 each 0   escitalopram  (LEXAPRO ) 20 MG tablet Take 1 tablet (20 mg total) by mouth daily. 90 tablet 0   esomeprazole  (NEXIUM ) 40 MG capsule Take 1 capsule (40 mg total) by mouth 2 (two) times daily before a meal. (Patient taking differently: Take 40 mg by mouth daily as needed (for acid reflux).) 180 capsule 3   finasteride  (PROSCAR ) 5 MG tablet Take 1 tablet (5 mg total) by mouth daily. 30 tablet 1  Glucose Blood (BLOOD GLUCOSE TEST STRIPS) STRP 1 each by In Vitro route in the morning, at noon, and at bedtime. May substitute to any manufacturer covered by patient's insurance. 100 strip 0   insulin  glargine (LANTUS  SOLOSTAR) 100 UNIT/ML  Solostar Pen Inject 18 Units into the skin daily. 15 mL 11   insulin  lispro (HUMALOG ) 100 UNIT/ML KwikPen Inject 5 Units into the skin 3 (three) times daily. 15 mL 1   Insulin  Pen Needle 32G X 4 MM MISC Use as directed with insulin  100 each 0   Lancet Device MISC 1 each by Does not apply route in the morning, at noon, and at bedtime. May substitute to any manufacturer covered by patient's insurance. 1 each 0   metFORMIN  (GLUCOPHAGE ) 500 MG tablet Take 1 tablet (500 mg total) by mouth 2 (two) times daily with a meal. 60 tablet 2   rosuvastatin  (CRESTOR ) 10 MG tablet Take 1 tablet (10 mg total) by mouth daily. 30 tablet 2   Semaglutide ,0.25 or 0.5MG /DOS, (OZEMPIC , 0.25 OR 0.5 MG/DOSE,) 2 MG/3ML SOPN Inject 0.25mg  under the skin once weekly for 4 weeks then inject 0.5mg  under the skin every week for 2 doses 3 mL 1   tadalafil  (CIALIS ) 10 MG tablet Take 0.5 tablets (5 mg total) by mouth daily. 45 tablet 0   Vitamin D , Ergocalciferol , (DRISDOL ) 1.25 MG (50000 UNIT) CAPS capsule Take 1 capsule (50,000 Units total) by mouth every 7 (seven) days. 8 capsule 0   No facility-administered medications prior to visit.    Family History  Problem Relation Age of Onset   Hypertension Mother    Diabetes Mother    Stroke Mother        cerbral aneurysm   Pancreatic cancer Mother    Colon cancer Father    Hypertension Brother    Sleep apnea Cousin    Mental illness Neg Hx    Stomach cancer Neg Hx    Esophageal cancer Neg Hx     Social History   Socioeconomic History   Marital status: Single    Spouse name: Not on file   Number of children: Not on file   Years of education: Not on file   Highest education level: Not on file  Occupational History   Not on file  Tobacco Use   Smoking status: Former    Current packs/day: 0.00    Average packs/day: 1 pack/day for 18.0 years (18.0 ttl pk-yrs)    Types: Cigarettes    Start date: 07/03/2003    Quit date: 07/02/2021    Years since quitting: 2.3     Passive exposure: Never   Smokeless tobacco: Never   Tobacco comments:    Pt vapes everyday  Vaping Use   Vaping status: Former   Quit date: 12/01/2022   Substances: Nicotine   Substance and Sexual Activity   Alcohol use: Yes    Comment: occasional use   Drug use: No   Sexual activity: Not on file    Comment: accepted condoms  Other Topics Concern   Not on file  Social History Narrative   Grew up in [place], now living in [place],-  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    Finished HS.   Doing online classes with [employer]- studying accounting   Previously 2 years of classes at [employer].    Has Partner- [name]- together for 1 year.    Has 2 girls- (born in 2000).  Social Drivers of Corporate investment banker Strain: High Risk (02/05/2023)   Overall Financial Resource Strain (CARDIA)    Difficulty of Paying Living Expenses: Very hard  Food Insecurity: No Food Insecurity (10/12/2023)   Hunger Vital Sign    Worried About Running Out of Food in the Last Year: Never true    Ran Out of Food in the Last Year: Never true  Transportation Needs: No Transportation Needs (10/12/2023)   PRAPARE - Administrator, Civil Service (Medical): No    Lack of Transportation (Non-Medical): No  Physical Activity: Sufficiently Active (07/16/2018)   Received from [facility], [facility]   Exercise Vital Sign    Days of Exercise per Week: 4 days    Minutes of Exercise per Session: 150+ min  Stress: Stress Concern Present (03/01/2023)   [employer] of Occupational Health - Occupational Stress Questionnaire    Feeling of Stress : To some extent  Social Connections: Moderately Isolated (10/12/2023)   Social Connection and Isolation Panel [NHANES]    Frequency of Communication with Friends and Family: Never    Frequency of Social Gatherings with Friends and Family: More than three times a week    Attends Religious  Services: 1 to 4 times per year    Active Member of [employer] or Organizations: No    Attends Banker Meetings: Never    Marital Status: Never married  Intimate Partner Violence: Not At Risk (10/12/2023)   Humiliation, Afraid, Rape, and Kick questionnaire    Fear of Current or Ex-Partner: No    Emotionally Abused: No    Physically Abused: No    Sexually Abused: No                                                                                                  Objective:  Physical Exam: BP 128/86   Pulse 79   Temp (!) 97.5 F (36.4 C) (Temporal)   Wt 241 lb 3.2 oz (109.4 kg)   SpO2 98%   BMI 32.71 kg/m   Wt Readings from Last 3 Encounters:  10/22/23 241 lb 3.2 oz (109.4 kg)  10/11/23 246 lb 7.6 oz (111.8 kg)  10/08/23 246 lb 6.4 oz (111.8 kg)     Physical Exam Constitutional:      Appearance: Normal appearance.  HENT:     Head: Normocephalic and atraumatic.     Right Ear: Hearing normal.     Left Ear: Hearing normal.     Nose: Nose normal.  Eyes:     General: No scleral icterus.       Right eye: No discharge.        Left eye: No discharge.     Extraocular Movements: Extraocular movements intact.  Cardiovascular:     Rate and Rhythm: Normal rate and regular rhythm.     Heart sounds: Normal heart sounds.  Pulmonary:     Effort: Pulmonary effort is normal.     Breath sounds: Normal breath sounds.  Abdominal:     Palpations: Abdomen is soft.  Tenderness: There is no abdominal tenderness.  Skin:    General: Skin is warm.     Findings: No rash.  Neurological:     General: No focal deficit present.     Mental Status: He is alert.     Cranial Nerves: No cranial nerve deficit.  Psychiatric:        Mood and Affect: Mood normal.        Behavior: Behavior normal.        Thought Content: Thought content normal.        Judgment: Judgment normal.     No results found.  Recent Results (from the past 2160 hours)  Urine cytology ancillary only      Status: None   Collection Time: 08/01/23 10:02 AM  Result Value Ref Range   Neisseria Gonorrhea Negative    Chlamydia Negative    Comment Normal Reference Ranger Chlamydia - Negative    Comment      Normal Reference Range Neisseria Gonorrhea - Negative  T-helper cells (CD4) count (not at [facility])     Status: None   Collection Time: 08/01/23 10:05 AM  Result Value Ref Range   CD4 T Helper % 35 30 - 61 %   Absolute CD4 1,023 490 - 1,740 cells/uL   Total lymphocyte count 2,965 850 - 3,900 cells/uL    Comment: The analytical performance characteristics of this assay  have been determined by [employer]. The  modifications have not been cleared or approved by the  FDA. This assay has been validated pursuant to the CLIA  regulations and is used for clinical purposes.   Hepatitis B surface antibody,quantitative     Status: Abnormal   Collection Time: 08/01/23 10:05 AM  Result Value Ref Range   Hep B S AB Quant (Post) 6 (L) > OR = 10 mIU/mL    Comment: . Patient does not have immunity to hepatitis B virus. . For additional information, please refer to http://education.[facility].com/faq/FAQ105 (This link is being provided for informational/ educational purposes only).   Hepatitis B surface antigen     Status: None   Collection Time: 08/01/23 10:05 AM  Result Value Ref Range   Hepatitis B Surface Ag NON-REACTIVE NON-REACTIVE    Comment: . For additional information, please refer to  http://education.[facility].com/faq/FAQ202  (This link is being provided for informational/ educational purposes only.) .   Hepatitis B DNA, ultraquantitative, PCR     Status: None   Collection Time: 08/01/23 10:05 AM  Result Value Ref Range   Hepatitis B DNA Not Detected IU/mL   Hepatitis B virus DNA Not Detected Log IU/mL    Comment: . Reference Range:                          Not Detected       IU/mL                          Not Detected   Log IU/mL . The analytical  performance characteristics of this assay have been determined by [employer]. The modifications have not been cleared or approved by the U.S. Food and Drug Administration. This assay has been validated pursuant to the CLIA regulations and is used for clinical purposes. .   RPR     Status: None   Collection Time: 08/01/23 10:05 AM  Result Value Ref Range   RPR Ser Ql NON-REACTIVE NON-REACTIVE    Comment: .  No laboratory evidence of syphilis. If recent exposure is suspected, submit a new sample in 2-4 weeks. .   Lipid panel     Status: Abnormal   Collection Time: 08/01/23 10:05 AM  Result Value Ref Range   Cholesterol 199 <200 mg/dL   HDL 42 > OR = 40 mg/dL   Triglycerides 629 (H) <150 mg/dL   LDL Cholesterol (Calc) 130 (H) mg/dL (calc)    Comment: Reference range: <100 . Desirable range <100 mg/dL for primary prevention;   <70 mg/dL for patients with CHD or diabetic patients  with > or = 2 CHD risk factors. Phillip Gill LDL-C is now calculated using the Martin-Hopkins  calculation, which is a validated novel method providing  better accuracy than the Friedewald equation in the  estimation of LDL-C.  Melinda Sprawls et al. Erroll Heard. 5284;132(44): 2061-2068  (http://education.[facility].com/faq/FAQ164)    Total CHOL/HDL Ratio 4.7 <5.0 (calc)   Non-HDL Cholesterol (Calc) 157 (H) <130 mg/dL (calc)    Comment: For patients with diabetes plus 1 major ASCVD risk  factor, treating to a non-HDL-C goal of <100 mg/dL  (LDL-C of <01 mg/dL) is considered a therapeutic  option.   CBC with Differential     Status: Abnormal   Collection Time: 09/16/23  8:09 AM  Result Value Ref Range   WBC 10.6 (H) 4.0 - 10.5 K/uL   RBC 4.79 4.22 - 5.81 MIL/uL   Hemoglobin 13.9 13.0 - 17.0 g/dL   HCT 02.7 25.3 - 66.4 %   MCV 89.4 80.0 - 100.0 fL   MCH 29.0 26.0 - 34.0 pg   MCHC 32.5 30.0 - 36.0 g/dL   RDW 40.3 47.4 - 25.9 %   Platelets 254 150 - 400 K/uL   nRBC 0.0 0.0 - 0.2 %   Neutrophils Relative %  55 %   Neutro Abs 5.9 1.7 - 7.7 K/uL   Lymphocytes Relative 34 %   Lymphs Abs 3.6 0.7 - 4.0 K/uL   Monocytes Relative 9 %   Monocytes Absolute 1.0 0.1 - 1.0 K/uL   Eosinophils Relative 1 %   Eosinophils Absolute 0.1 0.0 - 0.5 K/uL   Basophils Relative 0 %   Basophils Absolute 0.0 0.0 - 0.1 K/uL   Immature Granulocytes 1 %   Abs Immature Granulocytes 0.05 0.00 - 0.07 K/uL    Comment: Performed at [facility], [address], [place], [place] [address]  Comprehensive metabolic panel     Status: Abnormal   Collection Time: 09/16/23  8:09 AM  Result Value Ref Range   Sodium 138 135 - 145 mmol/L   Potassium 3.8 3.5 - 5.1 mmol/L   Chloride 104 98 - 111 mmol/L   CO2 30 22 - 32 mmol/L   Glucose, Bld 131 (H) 70 - 99 mg/dL    Comment: Glucose reference range applies only to samples taken after fasting for at least 8 hours.   BUN 10 6 - 20 mg/dL   Creatinine, Ser 5.64 0.61 - 1.24 mg/dL   Calcium  9.4 8.9 - 10.3 mg/dL   Total Protein 8.2 (H) 6.5 - 8.1 g/dL   Albumin 4.5 3.5 - 5.0 g/dL   AST 21 15 - 41 U/L   ALT 19 0 - 44 U/L   Alkaline Phosphatase 84 38 - 126 U/L   Total Bilirubin 0.5 0.0 - 1.2 mg/dL   GFR, Estimated >33 >29 mL/min    Comment: (NOTE) Calculated using the CKD-EPI Creatinine Equation (2021)    Anion gap 4 (L)  5 - 15    Comment: Performed at [facility], [address] [place], [place] [address]  B Nat Peptide     Status: None   Collection Time: 09/19/23  9:46 AM  Result Value Ref Range   Pro B Natriuretic peptide (BNP) 18.0 0.0 - 100.0 pg/mL  Thyroid  Panel With TSH     Status: None   Collection Time: 09/19/23  9:46 AM  Result Value Ref Range   T3 Uptake 27 22 - 35 %   T4, Total 6.0 4.9 - 10.5 mcg/dL   Free Thyroxine Index 1.6 1.4 - 3.8   TSH 1.39 0.40 - 4.50 mIU/L  Urinalysis w microscopic + reflex cultur     Status: None   Collection Time: 09/19/23  9:46 AM   Specimen: Blood  Result Value Ref Range   Color, Urine YELLOW  YELLOW   APPearance CLEAR CLEAR   Specific Gravity, Urine 1.008 1.001 - 1.035   pH 5.5 5.0 - 8.0   Glucose, UA NEGATIVE NEGATIVE   Bilirubin Urine NEGATIVE NEGATIVE   Ketones, ur NEGATIVE NEGATIVE   Hgb urine dipstick NEGATIVE NEGATIVE   Protein, ur NEGATIVE NEGATIVE   Nitrites, Initial NEGATIVE NEGATIVE   Leukocyte Esterase NEGATIVE NEGATIVE   WBC, UA NONE SEEN 0 - 5 /HPF   RBC / HPF NONE SEEN 0 - 2 /HPF   Squamous Epithelial / HPF NONE SEEN < OR = 5 /HPF   Bacteria, UA NONE SEEN NONE SEEN /HPF   Hyaline Cast NONE SEEN NONE SEEN /LPF   Note      Comment: This urine was analyzed for the presence of WBC,  RBC, bacteria, casts, and other formed elements.  Only those elements seen were reported. Phillip Gill .   Microalbumin / creatinine urine ratio     Status: None   Collection Time: 09/19/23  9:46 AM  Result Value Ref Range   Microalb, Ur <0.7 mg/dL   Creatinine,U 19.1 mg/dL   Microalb Creat Ratio Unable to calculate 0.0 - 30.0 mg/g    Comment: Unable to Calculate due to Microalbumin Result of <0.7 mg/dL  REFLEXIVE URINE CULTURE     Status: None   Collection Time: 09/19/23  9:46 AM  Result Value Ref Range   Reflexve Urine Culture      Comment: NO CULTURE INDICATED  Basic Metabolic Panel (BMET)     Status: Abnormal   Collection Time: 10/07/23  8:08 AM  Result Value Ref Range   Sodium 127 (L) 135 - 145 mEq/L   Potassium 4.2 3.5 - 5.1 mEq/L   Chloride 87 (L) 96 - 112 mEq/L   CO2 28 19 - 32 mEq/L   Glucose, Bld 782 (HH) 70 - 99 mg/dL   BUN 20 6 - 23 mg/dL   Creatinine, Ser 4.78 (H) 0.40 - 1.50 mg/dL   GFR 29.56 (L) >21.30 mL/min    Comment: Calculated using the CKD-EPI Creatinine Equation (2021)   Calcium  9.2 8.4 - 10.5 mg/dL  Urinalysis, Routine w reflex microscopic -Urine, Clean Catch     Status: Abnormal   Collection Time: 10/07/23  6:10 PM  Result Value Ref Range   Color, Urine YELLOW YELLOW   APPearance CLEAR CLEAR   Specific Gravity, Urine 1.010 1.005 - 1.030   pH 5.5 5.0  - 8.0   Glucose, UA >=500 (A) NEGATIVE mg/dL   Hgb urine dipstick NEGATIVE NEGATIVE   Bilirubin Urine NEGATIVE NEGATIVE   Ketones, ur NEGATIVE NEGATIVE mg/dL   Protein,  ur NEGATIVE NEGATIVE mg/dL   Nitrite NEGATIVE NEGATIVE   Leukocytes,Ua NEGATIVE NEGATIVE    Comment: Performed at [facility], [address] [place], [place] [address]  CBG monitoring, ED     Status: Abnormal   Collection Time: 10/07/23  6:10 PM  Result Value Ref Range   Glucose-Capillary >600 (HH) 70 - 99 mg/dL    Comment: Glucose reference range applies only to samples taken after fasting for at least 8 hours.  Urinalysis, Microscopic (reflex)     Status: None   Collection Time: 10/07/23  6:10 PM  Result Value Ref Range   RBC / HPF 0-5 0 - 5 RBC/hpf   WBC, UA 0-5 0 - 5 WBC/hpf   Bacteria, UA NONE SEEN NONE SEEN   Squamous Epithelial / HPF NONE SEEN 0 - 5 /HPF    Comment: Performed at [facility], [address] [place], [place] [address]  Basic metabolic panel     Status: Abnormal   Collection Time: 10/07/23  6:18 PM  Result Value Ref Range   Sodium 127 (L) 135 - 145 mmol/L   Potassium 4.0 3.5 - 5.1 mmol/L   Chloride 88 (L) 98 - 111 mmol/L   CO2 26 22 - 32 mmol/L   Glucose, Bld 707 (HH) 70 - 99 mg/dL    Comment: RESULT CALLED TO, READ BACK BY AND VERIFIED WITH SOPHIA GOUGE @ 1912 10/07/2023 PERRY, J. Glucose reference range applies only to samples taken after fasting for at least 8 hours.    BUN 22 (H) 6 - 20 mg/dL   Creatinine, Ser 0.98 (H) 0.61 - 1.24 mg/dL   Calcium  8.7 (L) 8.9 - 10.3 mg/dL   GFR, Estimated 52 (L) >60 mL/min    Comment: (NOTE) Calculated using the CKD-EPI Creatinine Equation (2021)    Anion gap 13 5 - 15    Comment: Performed at [facility], [address] [place], [place] [address]  CBC     Status: Abnormal   Collection Time: 10/07/23  6:18 PM  Result Value Ref Range   WBC 12.8 (H) 4.0 - 10.5 K/uL   RBC 4.73 4.22 - 5.81 MIL/uL   Hemoglobin 13.7  13.0 - 17.0 g/dL   HCT 78.2 95.6 - 21.3 %   MCV 83.1 80.0 - 100.0 fL   MCH 29.0 26.0 - 34.0 pg   MCHC 34.9 30.0 - 36.0 g/dL   RDW 08.6 57.8 - 46.9 %   Platelets 230 150 - 400 K/uL   nRBC 0.0 0.0 - 0.2 %    Comment: Performed at [facility], [address] [place], [place] [address]  I-Stat venous blood gas, [facility], [facility], [facility])     Status: Abnormal   Collection Time: 10/07/23  6:20 PM  Result Value Ref Range   pH, Ven 7.413 7.25 - 7.43   pCO2, Ven 43.9 (L) 44 - 60 mmHg   pO2, Ven 60 (H) 32 - 45 mmHg   Bicarbonate 28.0 20.0 - 28.0 mmol/L   TCO2 29 22 - 32 mmol/L   O2 Saturation 91 %   Acid-Base Excess 3.0 (H) 0.0 - 2.0 mmol/L   Sodium 130 (L) 135 - 145 mmol/L   Potassium 4.2 3.5 - 5.1 mmol/L   Calcium , Ion 1.09 (L) 1.15 - 1.40 mmol/L   HCT 43.0 39.0 - 52.0 %   Hemoglobin 14.6 13.0 - 17.0 g/dL   Collection site IV start  Drawn by Nurse    Sample type VENOUS   Beta-hydroxybutyric acid     Status: None   Collection Time: 10/07/23  7:30 PM  Result Value Ref Range   Beta-Hydroxybutyric Acid 0.11 0.05 - 0.27 mmol/L    Comment: Performed at [facility], [address] [place], [place] [address]  CBG monitoring, ED     Status: Abnormal   Collection Time: 10/07/23  8:32 PM  Result Value Ref Range   Glucose-Capillary >600 (HH) 70 - 99 mg/dL    Comment: Glucose reference range applies only to samples taken after fasting for at least 8 hours.  CBG monitoring, ED     Status: Abnormal   Collection Time: 10/07/23  9:26 PM  Result Value Ref Range   Glucose-Capillary 559 (HH) 70 - 99 mg/dL    Comment: Glucose reference range applies only to samples taken after fasting for at least 8 hours.   Comment 1 Notify RN   CBG monitoring, ED     Status: Abnormal   Collection Time: 10/07/23  9:59 PM  Result Value Ref Range   Glucose-Capillary 523 (HH) 70 - 99 mg/dL    Comment: Glucose reference range applies only to samples taken after fasting for at least 8 hours.   Comment 1  Notify RN   CBG monitoring, ED     Status: Abnormal   Collection Time: 10/07/23 10:28 PM  Result Value Ref Range   Glucose-Capillary 456 (H) 70 - 99 mg/dL    Comment: Glucose reference range applies only to samples taken after fasting for at least 8 hours.  CBG monitoring, ED     Status: Abnormal   Collection Time: 10/07/23 10:59 PM  Result Value Ref Range   Glucose-Capillary 369 (H) 70 - 99 mg/dL    Comment: Glucose reference range applies only to samples taken after fasting for at least 8 hours.   Comment 1 Notify RN   POCT glucose (manual entry)     Status: Abnormal   Collection Time: 10/08/23 11:07 AM  Result Value Ref Range   POC Glucose 432 (A) 70 - 99 mg/dl  POCT glycosylated hemoglobin (Hb A1C)     Status: Abnormal   Collection Time: 10/08/23 11:09 AM  Result Value Ref Range   Hemoglobin A1C 10.3 (A) 4.0 - 5.6 %   HbA1c POC (<> result, manual entry)     HbA1c, POC (prediabetic range)     HbA1c, POC (controlled diabetic range)    C-peptide     Status: None   Collection Time: 10/08/23 11:21 AM  Result Value Ref Range   C-Peptide 2.87 0.80 - 3.85 ng/mL  Basic Metabolic Panel (BMET)     Status: Abnormal   Collection Time: 10/08/23 11:21 AM  Result Value Ref Range   Sodium 130 (L) 135 - 145 mEq/L   Potassium 4.4 3.5 - 5.1 mEq/L   Chloride 94 (L) 96 - 112 mEq/L   CO2 27 19 - 32 mEq/L   Glucose, Bld 523 (HH) 70 - 99 mg/dL   BUN 18 6 - 23 mg/dL   Creatinine, Ser 6.04 0.40 - 1.50 mg/dL   GFR 54.09 >81.19 mL/min    Comment: Calculated using the CKD-EPI Creatinine Equation (2021)   Calcium  9.1 8.4 - 10.5 mg/dL  Urinalysis w microscopic + reflex cultur     Status: Abnormal   Collection Time: 10/08/23 11:22 AM   Specimen: Urine  Result Value Ref Range   Color, Urine YELLOW YELLOW  APPearance CLEAR CLEAR   Specific Gravity, Urine 1.036 (H) 1.001 - 1.035   pH 6.0 5.0 - 8.0   Glucose, UA 3+ (A) NEGATIVE   Bilirubin Urine NEGATIVE NEGATIVE   Ketones, ur NEGATIVE NEGATIVE    Hgb urine dipstick NEGATIVE NEGATIVE   Protein, ur TRACE (A) NEGATIVE   Nitrites, Initial NEGATIVE NEGATIVE   Leukocyte Esterase NEGATIVE NEGATIVE   WBC, UA NONE SEEN 0 - 5 /HPF   RBC / HPF NONE SEEN 0 - 2 /HPF   Squamous Epithelial / HPF NONE SEEN < OR = 5 /HPF   Bacteria, UA NONE SEEN NONE SEEN /HPF   Hyaline Cast NONE SEEN NONE SEEN /LPF   Note      Comment: This urine was analyzed for the presence of WBC,  RBC, bacteria, casts, and other formed elements.  Only those elements seen were reported. Phillip Gill .   Microalbumin / creatinine urine ratio     Status: Abnormal   Collection Time: 10/08/23 11:22 AM  Result Value Ref Range   Microalb, Ur 3.8 (H) 0.0 - 1.9 mg/dL   Creatinine,U 62.9 mg/dL   Microalb Creat Ratio 76.3 (H) 0.0 - 30.0 mg/g  REFLEXIVE URINE CULTURE     Status: None   Collection Time: 10/08/23 11:22 AM  Result Value Ref Range   Reflexve Urine Culture      Comment: NO CULTURE INDICATED  CBG monitoring, ED     Status: Abnormal   Collection Time: 10/11/23  4:55 PM  Result Value Ref Range   Glucose-Capillary 479 (H) 70 - 99 mg/dL    Comment: Glucose reference range applies only to samples taken after fasting for at least 8 hours.  Lipase, blood     Status: None   Collection Time: 10/11/23  5:12 PM  Result Value Ref Range   Lipase 40 11 - 51 U/L    Comment: Performed at [facility], [address], [place], [place] [address]  Comprehensive metabolic panel     Status: Abnormal   Collection Time: 10/11/23  5:12 PM  Result Value Ref Range   Sodium 131 (L) 135 - 145 mmol/L   Potassium 3.6 3.5 - 5.1 mmol/L   Chloride 91 (L) 98 - 111 mmol/L   CO2 25 22 - 32 mmol/L   Glucose, Bld 450 (H) 70 - 99 mg/dL    Comment: Glucose reference range applies only to samples taken after fasting for at least 8 hours.   BUN 30 (H) 6 - 20 mg/dL   Creatinine, Ser 3.24 (H) 0.61 - 1.24 mg/dL   Calcium  9.4 8.9 - 10.3 mg/dL   Total Protein 8.7 (H) 6.5 - 8.1 g/dL   Albumin 4.2 3.5 - 5.0  g/dL   AST 20 15 - 41 U/L   ALT 20 0 - 44 U/L   Alkaline Phosphatase 97 38 - 126 U/L   Total Bilirubin 1.0 0.0 - 1.2 mg/dL   GFR, Estimated 43 (L) >60 mL/min    Comment: (NOTE) Calculated using the CKD-EPI Creatinine Equation (2021)    Anion gap 15 5 - 15    Comment: Performed at [facility], [address] [place], [place] [address]  CBC     Status: Abnormal   Collection Time: 10/11/23  5:12 PM  Result Value Ref Range   WBC 14.7 (H) 4.0 - 10.5 K/uL   RBC 5.23 4.22 - 5.81 MIL/uL   Hemoglobin 14.9 13.0 - 17.0 g/dL   HCT 44.4  39.0 - 52.0 %   MCV 84.9 80.0 - 100.0 fL   MCH 28.5 26.0 - 34.0 pg   MCHC 33.6 30.0 - 36.0 g/dL   RDW 95.6 21.3 - 08.6 %   Platelets 263 150 - 400 K/uL   nRBC 0.0 0.0 - 0.2 %    Comment: Performed at [facility], [address] [place], [place] [address]  Urinalysis, Routine w reflex microscopic -Urine, Clean Catch     Status: Abnormal   Collection Time: 10/11/23  6:45 PM  Result Value Ref Range   Color, Urine YELLOW YELLOW   APPearance CLEAR CLEAR   Specific Gravity, Urine 1.014 1.005 - 1.030   pH 5.0 5.0 - 8.0   Glucose, UA >=500 (A) NEGATIVE mg/dL   Hgb urine dipstick NEGATIVE NEGATIVE   Bilirubin Urine NEGATIVE NEGATIVE   Ketones, ur NEGATIVE NEGATIVE mg/dL   Protein, ur NEGATIVE NEGATIVE mg/dL   Nitrite NEGATIVE NEGATIVE   Leukocytes,Ua NEGATIVE NEGATIVE   RBC / HPF 0-5 0 - 5 RBC/hpf   WBC, UA 0-5 0 - 5 WBC/hpf   Bacteria, UA NONE SEEN NONE SEEN   Squamous Epithelial / HPF 0-5 0 - 5 /HPF   Mucus PRESENT    Hyaline Casts, UA PRESENT     Comment: Performed at [facility], [address], [place], [place] [address]  I-Stat venous blood gas, [facility] ED, [facility], [facility])     Status: Abnormal   Collection Time: 10/11/23 11:55 PM  Result Value Ref Range   pH, Ven 7.393 7.25 - 7.43   pCO2, Ven 48.9 44 - 60 mmHg   pO2, Ven 47 (H) 32 - 45 mmHg   Bicarbonate 29.8 (H) 20.0 - 28.0 mmol/L   TCO2 31 22 - 32 mmol/L   O2 Saturation 82 %    Acid-Base Excess 4.0 (H) 0.0 - 2.0 mmol/L   Sodium 129 (L) 135 - 145 mmol/L   Potassium 4.5 3.5 - 5.1 mmol/L   Calcium , Ion 1.05 (L) 1.15 - 1.40 mmol/L   HCT 46.0 39.0 - 52.0 %   Hemoglobin 15.6 13.0 - 17.0 g/dL   Sample type VENOUS   I-stat chem 8, ED (not at [facility], [facility] or [facility])     Status: Abnormal   Collection Time: 10/12/23 12:17 AM  Result Value Ref Range   Sodium 129 (L) 135 - 145 mmol/L   Potassium 4.5 3.5 - 5.1 mmol/L   Chloride 90 (L) 98 - 111 mmol/L   BUN 39 (H) 6 - 20 mg/dL   Creatinine, Ser 2.84 (H) 0.61 - 1.24 mg/dL   Glucose, Bld 132 (HH) 70 - 99 mg/dL    Comment: Glucose reference range applies only to samples taken after fasting for at least 8 hours.   Calcium , Ion 1.08 (L) 1.15 - 1.40 mmol/L   TCO2 28 22 - 32 mmol/L   Hemoglobin 16.0 13.0 - 17.0 g/dL   HCT 44.0 10.2 - 72.5 %   Comment NOTIFIED PHYSICIAN   CBG monitoring, ED     Status: Abnormal   Collection Time: 10/12/23  1:56 AM  Result Value Ref Range   Glucose-Capillary >600 (HH) 70 - 99 mg/dL    Comment: Glucose reference range applies only to samples taken after fasting for at least 8 hours.  Basic metabolic panel     Status: Abnormal   Collection Time: 10/12/23  7:49 AM  Result Value Ref Range   Sodium 131 (L) 135 - 145 mmol/L  Potassium 4.2 3.5 - 5.1 mmol/L   Chloride 92 (L) 98 - 111 mmol/L   CO2 25 22 - 32 mmol/L   Glucose, Bld 476 (H) 70 - 99 mg/dL    Comment: Glucose reference range applies only to samples taken after fasting for at least 8 hours.   BUN 34 (H) 6 - 20 mg/dL   Creatinine, Ser 9.60 (H) 0.61 - 1.24 mg/dL   Calcium  8.7 (L) 8.9 - 10.3 mg/dL   GFR, Estimated 43 (L) >60 mL/min    Comment: (NOTE) Calculated using the CKD-EPI Creatinine Equation (2021)    Anion gap 14 5 - 15    Comment: Performed at [facility], [address] [place], [place] [address]  CBC with Differential/Platelet     Status: Abnormal   Collection Time: 10/12/23  7:49 AM  Result Value Ref Range   WBC 13.2  (H) 4.0 - 10.5 K/uL   RBC 4.83 4.22 - 5.81 MIL/uL   Hemoglobin 13.7 13.0 - 17.0 g/dL   HCT 81.1 91.4 - 78.2 %   MCV 87.2 80.0 - 100.0 fL   MCH 28.4 26.0 - 34.0 pg   MCHC 32.5 30.0 - 36.0 g/dL   RDW 95.6 21.3 - 08.6 %   Platelets 241 150 - 400 K/uL   nRBC 0.0 0.0 - 0.2 %   Neutrophils Relative % 65 %   Neutro Abs 8.5 (H) 1.7 - 7.7 K/uL   Lymphocytes Relative 27 %   Lymphs Abs 3.5 0.7 - 4.0 K/uL   Monocytes Relative 8 %   Monocytes Absolute 1.1 (H) 0.1 - 1.0 K/uL   Eosinophils Relative 0 %   Eosinophils Absolute 0.0 0.0 - 0.5 K/uL   Basophils Relative 0 %   Basophils Absolute 0.0 0.0 - 0.1 K/uL   Immature Granulocytes 0 %   Abs Immature Granulocytes 0.05 0.00 - 0.07 K/uL    Comment: Performed at [facility], [address], [place], [place] [address]  CBG monitoring, ED     Status: Abnormal   Collection Time: 10/12/23  8:28 AM  Result Value Ref Range   Glucose-Capillary 449 (H) 70 - 99 mg/dL    Comment: Glucose reference range applies only to samples taken after fasting for at least 8 hours.  Glucose, capillary     Status: Abnormal   Collection Time: 10/12/23 12:19 PM  Result Value Ref Range   Glucose-Capillary 373 (H) 70 - 99 mg/dL    Comment: Glucose reference range applies only to samples taken after fasting for at least 8 hours.  Glucose, capillary     Status: Abnormal   Collection Time: 10/12/23  4:21 PM  Result Value Ref Range   Glucose-Capillary 426 (H) 70 - 99 mg/dL    Comment: Glucose reference range applies only to samples taken after fasting for at least 8 hours.  Glucose, capillary     Status: Abnormal   Collection Time: 10/12/23  6:58 PM  Result Value Ref Range   Glucose-Capillary 435 (H) 70 - 99 mg/dL    Comment: Glucose reference range applies only to samples taken after fasting for at least 8 hours.  Glucose, capillary     Status: Abnormal   Collection Time: 10/12/23  8:28 PM  Result Value Ref Range   Glucose-Capillary 283 (H) 70 - 99 mg/dL     Comment: Glucose reference range applies only to samples taken after fasting for at least 8 hours.  Glucose, capillary  Status: Abnormal   Collection Time: 10/13/23  7:55 AM  Result Value Ref Range   Glucose-Capillary 164 (H) 70 - 99 mg/dL    Comment: Glucose reference range applies only to samples taken after fasting for at least 8 hours.  CBC     Status: Abnormal   Collection Time: 10/13/23  9:03 AM  Result Value Ref Range   WBC 10.6 (H) 4.0 - 10.5 K/uL   RBC 4.28 4.22 - 5.81 MIL/uL   Hemoglobin 12.3 (L) 13.0 - 17.0 g/dL   HCT 16.1 (L) 09.6 - 04.5 %   MCV 85.7 80.0 - 100.0 fL   MCH 28.7 26.0 - 34.0 pg   MCHC 33.5 30.0 - 36.0 g/dL   RDW 40.9 81.1 - 91.4 %   Platelets 199 150 - 400 K/uL   nRBC 0.0 0.0 - 0.2 %    Comment: Performed at [facility], [address] [place], [place] [address]  Basic metabolic panel     Status: Abnormal   Collection Time: 10/13/23  9:03 AM  Result Value Ref Range   Sodium 136 135 - 145 mmol/L   Potassium 3.9 3.5 - 5.1 mmol/L   Chloride 103 98 - 111 mmol/L   CO2 25 22 - 32 mmol/L   Glucose, Bld 234 (H) 70 - 99 mg/dL    Comment: Glucose reference range applies only to samples taken after fasting for at least 8 hours.   BUN 22 (H) 6 - 20 mg/dL   Creatinine, Ser 6.21 (H) 0.61 - 1.24 mg/dL   Calcium  8.8 (L) 8.9 - 10.3 mg/dL   GFR, Estimated >30 >86 mL/min    Comment: (NOTE) Calculated using the CKD-EPI Creatinine Equation (2021)    Anion gap 8 5 - 15    Comment: Performed at [facility], [address], [place], [place] [address]  Glucose, capillary     Status: Abnormal   Collection Time: 10/13/23 11:59 AM  Result Value Ref Range   Glucose-Capillary 268 (H) 70 - 99 mg/dL    Comment: Glucose reference range applies only to samples taken after fasting for at least 8 hours.   Comment 1 Document in Chart   Glucose, capillary     Status: Abnormal   Collection Time: 10/13/23  4:55 PM  Result Value Ref Range   Glucose-Capillary 223 (H)  70 - 99 mg/dL    Comment: Glucose reference range applies only to samples taken after fasting for at least 8 hours.  Glucose, capillary     Status: Abnormal   Collection Time: 10/13/23  8:00 PM  Result Value Ref Range   Glucose-Capillary 282 (H) 70 - 99 mg/dL    Comment: Glucose reference range applies only to samples taken after fasting for at least 8 hours.  Basic metabolic panel with GFR     Status: Abnormal   Collection Time: 10/14/23  3:09 AM  Result Value Ref Range   Sodium 138 135 - 145 mmol/L   Potassium 3.8 3.5 - 5.1 mmol/L   Chloride 106 98 - 111 mmol/L   CO2 27 22 - 32 mmol/L   Glucose, Bld 216 (H) 70 - 99 mg/dL    Comment: Glucose reference range applies only to samples taken after fasting for at least 8 hours.   BUN 17 6 - 20 mg/dL   Creatinine, Ser 9.62 0.61 - 1.24 mg/dL   Calcium  9.2 8.9 - 10.3 mg/dL   GFR, Estimated >95 >28 mL/min  Comment: (NOTE) Calculated using the CKD-EPI Creatinine Equation (2021)    Anion gap 5 5 - 15    Comment: Performed at [facility], [address] [place], [place] [address]  Glucose, capillary     Status: Abnormal   Collection Time: 10/14/23  8:35 AM  Result Value Ref Range   Glucose-Capillary 209 (H) 70 - 99 mg/dL    Comment: Glucose reference range applies only to samples taken after fasting for at least 8 hours.  Glucose, capillary     Status: Abnormal   Collection Time: 10/14/23 11:49 AM  Result Value Ref Range   Glucose-Capillary 183 (H) 70 - 99 mg/dL    Comment: Glucose reference range applies only to samples taken after fasting for at least 8 hours.        Carnell Christian, MD, MS

## 2023-10-22 NOTE — Patient Instructions (Signed)
 VISIT SUMMARY:  You came in today for a follow-up on your blood sugar management. Since starting Ozempic , your blood sugar levels have improved significantly, and you have experienced some weight loss. We discussed your current medications and made some adjustments to your treatment plan. Your kidney function has also improved, and your acute kidney injury has resolved.  YOUR PLAN:  -TYPE 2 DIABETES MELLITUS WITH HYPERGLYCEMIA: Type 2 diabetes is a condition where your body does not use insulin  properly, leading to high blood sugar levels. Your blood sugar levels have improved since starting Ozempic . Continue taking Ozempic  0.25 mg weekly for 4 weeks, then increase to 0.5 mg if tolerated. Reduce your Lantus  dose to 10 units daily and discontinue Humalog . We will provide refills for your [facility] sensors. You will be referred to the pharmacy team for medication management and connected with a diabetic educator for nutritional services. Please monitor your blood sugar levels regularly.  -ACUTE KIDNEY INJURY, RESOLVED: An acute kidney injury is a sudden episode of kidney failure or damage. Your kidney function has improved, and your acute kidney injury has resolved. Ensure you stay adequately hydrated and continue to monitor your kidney function and protein levels.  INSTRUCTIONS:  Please follow up in 1 month. Continue to monitor your blood sugar levels regularly and stay hydrated. We will provide refills for your [facility] sensors and connect you with a diabetic educator for nutritional services.

## 2023-10-24 ENCOUNTER — Ambulatory Visit (HOSPITAL_COMMUNITY): Admitting: Licensed Clinical Social Worker

## 2023-10-28 ENCOUNTER — Other Ambulatory Visit: Payer: Self-pay | Admitting: Pharmacy Technician

## 2023-10-28 ENCOUNTER — Other Ambulatory Visit: Payer: Self-pay

## 2023-10-28 NOTE — Progress Notes (Signed)
 Specialty Pharmacy Refill Coordination Note  Phillip Gill is a 48 y.o. male contacted today regarding refills of specialty medication(s) Bictegravir-Emtricitab-Tenofov (Biktarvy )   Patient requested Delivery   Delivery date: 10/31/23   Verified address: [address]  [place]   Medication will be filled on 10/30/23.

## 2023-10-29 ENCOUNTER — Other Ambulatory Visit: Payer: Self-pay

## 2023-10-29 ENCOUNTER — Other Ambulatory Visit (HOSPITAL_COMMUNITY): Payer: Self-pay

## 2023-10-29 ENCOUNTER — Other Ambulatory Visit: Payer: MEDICAID

## 2023-10-29 NOTE — Progress Notes (Signed)
 10/29/2023 Name: Phillip Gill MRN: [id number] DOB: March 05, 1976  Chief Complaint  Patient presents with   Diabetes Management Plan   Phillip Gill is a 48 y.o. year old male who presented for a telephone visit.   They were referred to the pharmacist by their PCP for assistance in managing diabetes.   Subjective:  Care Team: Primary Care Provider: Catheryn Cluck, MD ; Next Scheduled Visit: 5/23  Medication Access/Adherence  Current Pharmacy:  [facility] [address] [place] [address] Phone: [phone] Fax: [phone]  [facility] [address] [place] [address] Phone: [phone] Fax: [phone]  [facility] [address] [place] [address] Phone: [phone] Fax: [phone]  -Patient reports affordability concerns with their medications: No  -Patient reports access/transportation concerns to their pharmacy: No  -Patient reports adherence concerns with their medications:  No    Diabetes: Current medications: Ozempic  0.5mg  weekly -Medications tried in the past: patient did not tolerate metformin  recently prescribed (n/v) -Using Libre 3 for CGM and endorses BG 80-150 with occasional instances of readings in the 60's- [name] will alert and patient gets food/juice to bring back into normal range -No longer using basal or bolus insulin  due to recently improved BG readings -Patient states he did not start Ozempic  at 0.25mg  dose and has been doing 0.5mg  weekly since starting medication approximately 2.5 weeks ago.  Tolerating mostly well with occasional diarrhea. -Only recently diagnosed with DM- A1c 10.3% on 4/8- Cpeptide value came back WNL, reflecting likelihood of T2DM versus T1DM  Objective:  Lab Results  Component Value Date   HGBA1C 10.3 (A) 10/08/2023   Lab Results  Component Value Date   CREATININE 1.22 10/14/2023    BUN 17 10/14/2023   NA 138 10/14/2023   K 3.8 10/14/2023   CL 106 10/14/2023   CO2 27 10/14/2023    Lab Results  Component Value Date   CHOL 199 08/01/2023   HDL 42 08/01/2023   LDLCALC 130 (H) 08/01/2023   TRIG 158 (H) 08/01/2023   CHOLHDL 4.7 08/01/2023   Medications Reviewed Today     Reviewed by Linn Rich, RPH (Pharmacist) on 10/29/23 at 1131  Med List Status: <None>   Medication Order Taking? Sig Documenting Provider Last Dose Status Informant  Accu-Chek Softclix Lancets lancets [id number]  Use to check blood sugar in the morning, at noon, and at bedtime. Catheryn Cluck, MD  Active Self  ARIPiprazole  (ABILIFY ) 5 MG tablet [id number] Yes Take 1 tablet (5 mg total) by mouth daily. Augusta Blizzard, MD Taking Active Self  bictegravir-emtricitabine -tenofovir  AF (BIKTARVY ) 50-200-25 MG TABS tablet [id number] Yes TAKE 1 TABLET BY MOUTH EVERY DAY [name], [name], FNP Taking Active Self  Blood Glucose Monitoring Suppl (BLOOD GLUCOSE MONITOR SYSTEM) w/Device KIT [id number]  Use to check blood sugar in the morning, at noon, and at bedtime. Catheryn Cluck, MD  Active Self  buPROPion  (WELLBUTRIN  XL) 150 MG 24 hr tablet [id number] Yes Take 1 tablet (150 mg total) by mouth daily. Augusta Blizzard, MD Taking Active Self  Continuous Glucose Sensor (FREESTYLE LIBRE 3 SENSOR) [place] [id number] Yes Place 1 sensor on the skin every 14 days. Use to check glucose continuously Catheryn Cluck, MD Taking Active   diclofenac  Sodium (VOLTAREN ) 1 % GEL [id number]  Apply 4 grams topically 4 (four) times daily to affected joint. [name], MD  Active Self  Elastic Bandages &  Supports (CARPAL TUNNEL WRIST STABILIZER) MISC [id number]  1 each by Does not apply route as needed. Catheryn Cluck, MD  Active Self  Elastic Bandages & Supports (MEDICAL COMPRESSION STOCKINGS) MISC [id number]  1 each by Does not apply route daily. Catheryn Cluck, MD  Active Self  escitalopram  (LEXAPRO ) 20 MG tablet [id number]  Take 1  tablet (20 mg total) by mouth daily. Augusta Blizzard, MD  Active Self  esomeprazole  (NEXIUM ) 40 MG capsule [id number] Yes Take 1 capsule (40 mg total) by mouth 2 (two) times daily before a meal.  Patient taking differently: Take 40 mg by mouth daily as needed (for acid reflux).   [name], MD Taking Active Self  finasteride  (PROSCAR ) 5 MG tablet [id number] Yes Take 1 tablet (5 mg total) by mouth daily. Catheryn Cluck, MD Taking Active Self  Glucose Blood (BLOOD GLUCOSE TEST STRIPS) STRP [id number]  1 each by In Vitro route in the morning, at noon, and at bedtime. May substitute to any manufacturer covered by patient's insurance. Catheryn Cluck, MD  Active Self  insulin  glargine (LANTUS  SOLOSTAR) 100 UNIT/ML Solostar Pen [id number] No Inject 10 Units into the skin daily.  Patient not taking: Reported on 10/29/2023   Catheryn Cluck, MD Not Taking Active   Insulin  Pen Needle 32G X 4 MM MISC [id number] No Use as directed with insulin   Patient not taking: Reported on 10/29/2023   [name], [name], MD Not Taking Active   Lancet Device MISC [id number]  1 each by Does not apply route in the morning, at noon, and at bedtime. May substitute to any manufacturer covered by patient's insurance. Catheryn Cluck, MD  Active Self  rosuvastatin  (CRESTOR ) 10 MG tablet [id number] Yes Take 1 tablet (10 mg total) by mouth daily. [name], [name], FNP Taking Active Self  Semaglutide ,0.25 or 0.5MG /DOS, (OZEMPIC , 0.25 OR 0.5 MG/DOSE,) 2 MG/3ML SOPN [id number] Yes Inject 0.25mg  under the skin once weekly for 4 weeks then inject 0.5mg  under the skin every week for 2 doses  Patient taking differently: Inject 0.5 mg into the skin once a week.   [name], [name], MD Taking Active   tadalafil  (CIALIS ) 10 MG tablet [id number] Yes Take 0.5 tablets (5 mg total) by mouth daily. Catheryn Cluck, MD Taking Active Self  Vitamin D , Ergocalciferol , (DRISDOL ) 1.25 MG (50000 UNIT) CAPS capsule [id number] Yes Take 1 capsule  (50,000 Units total) by mouth every 7 (seven) days. Augusta Blizzard, MD Taking Active Self           Assessment/Plan:   Diabetes: -Currently uncontrolled but recently BG readings reflect greatly improved control -Continue Ozempic  0.5mg  weekly -Continue CGM with [name] 3- sending email invite for patient to link data to [facility] LibreView Dashboard  Follow Up Plan: 2 weeks  Linn Rich, PharmD, DPLA

## 2023-10-29 NOTE — Progress Notes (Unsigned)
   10/29/2023  Patient ID: Phillip Gill, male   DOB: 19-Apr-1976, 48 y.o.   MRN: [id number]  Outreach attempt for scheduled telephone visit unsuccessful.  Called and left HIPAA compliant voicemail with my direct number [phone].  I am also sending a MyChart message to see if we can reschedule to a time that may work better for the patient.  Linn Rich, PharmD, DPLA

## 2023-10-30 ENCOUNTER — Other Ambulatory Visit: Payer: Self-pay

## 2023-10-30 DIAGNOSIS — B20 Human immunodeficiency virus [HIV] disease: Secondary | ICD-10-CM

## 2023-10-30 DIAGNOSIS — B181 Chronic viral hepatitis B without delta-agent: Secondary | ICD-10-CM

## 2023-10-31 ENCOUNTER — Ambulatory Visit (INDEPENDENT_AMBULATORY_CARE_PROVIDER_SITE_OTHER): Payer: MEDICAID | Admitting: Licensed Clinical Social Worker

## 2023-10-31 DIAGNOSIS — F411 Generalized anxiety disorder: Secondary | ICD-10-CM

## 2023-10-31 DIAGNOSIS — F330 Major depressive disorder, recurrent, mild: Secondary | ICD-10-CM

## 2023-10-31 NOTE — Progress Notes (Unsigned)
 THERAPIST PROGRESS NOTE   Session Date: 10/31/2023  Session Time: 1117 - 1158  Participation Level: Active  Behavioral Response: CasualAlertEuthymic  Type of Therapy: Individual Therapy  Treatment Goals addressed:  - LTG: Reduce frequency, intensity, and duration of depression symptoms so that daily functioning is improved (OP Depression) - LTG: Increase coping skills to manage depression and improve ability to perform daily activities (OP Depression) - LTG: "To not be depressed, have more energy, and stop over eating" (OP Depression) - STG: Report a decrease in anxiety symptoms as evidenced by an overall reduction in anxiety score by a minimum of 25% on the Generalized Anxiety Disorder Scale (GAD-7) (Anxiety) - STG: Phillip "Shan" will reduce frequency of avoidant behaviors by 50% as evidenced by self-report in therapy sessions (Anxiety) - LTG: "Be a more people person" Increase frequency at which pt is actively engaging in social activities. (Anxiety)  ProgressTowards Goals: Progressing  Interventions: CBT, Solution Focused, and Supportive  Summary: Phillip Gill is a 48 y.o. male with past psych history of MDD and GAD, presenting for follow up therapy session in efforts to improve management of depressive and anxious symptoms.  Patient actively engaged in session, presenting in overall pleasant moods, and congruent affect.  Patient actively engaged in introductory check-in, sharing of having orientation scheduled today for new employment he proved to have secured over recent weeks at a facility closer to home with greater pay.  Actively processed thoughts and feelings surrounding transition any presenting stressors related to upcoming change over coming 2 weeks.  Revisited ongoing concerns within the home surrounding stress experienced residing with 2 elderly aunts, and challenging interactions between patient and one of his aunts that lives in his home.  Actively processed patient's thoughts,  feelings, and perspectives surrounding ways in which he may prove to resolve the issue.  Patient responded well to interventions. Patient continues to meet criteria for MDD and GAD. Patient will continue to benefit from engagement in outpatient therapy due to being the least restrictive service to meet presenting needs.      10/03/2023    9:09 AM 09/24/2023   10:19 AM 09/09/2023   10:13 AM 08/08/2023    4:12 PM  GAD 7 : Generalized Anxiety Score  Nervous, Anxious, on Edge 1 3 3 2   Control/stop worrying 3 3 3 2   Worry too much - different things 3 0 3 3  Trouble relaxing 3 3 1 2   Restless 2 1 1 2   Easily annoyed or irritable 3 3 3 3   Afraid - awful might happen 3 1 3 3   Total GAD 7 Score 18 14 17 17   Anxiety Difficulty Extremely difficult Extremely difficult Somewhat difficult Somewhat difficult      10/03/2023    9:09 AM 09/24/2023   10:23 AM 09/09/2023   10:16 AM 08/08/2023    4:23 PM 08/01/2023    9:36 AM  Depression screen PHQ 2/9  Decreased Interest 3 3 3 3 1   Down, Depressed, Hopeless 3 3 3 3 1   PHQ - 2 Score 6 6 6 6 2   Altered sleeping 2 3 3 3 1   Tired, decreased energy 3 3 3 3 1   Change in appetite 3 3 1 3 1   Feeling bad or failure about yourself  3 3 3 3 1   Trouble concentrating 3 3 3 3 1   Moving slowly or fidgety/restless 3 3 3 3 1   Suicidal thoughts 3 1 1  0 0  PHQ-9 Score 26 25 23 24 8   Difficult  doing work/chores Extremely dIfficult Extremely dIfficult Extremely dIfficult Somewhat difficult Extremely dIfficult   Flowsheet Row ED to Hosp-Admission (Discharged) from 10/11/2023 in [facility] from 10/07/2023 in [facility] Emergency Department at [facility] ED from 09/16/2023 in [facility] Emergency Department at [facility]  C-SSRS RISK CATEGORY No Risk No Risk No Risk       Suicidal/Homicidal: Yes. pSI, "feeling wouldn't be in anyone's way if I wasn't here". Pt detailed thoughts have been related to living situation with  aunts.  Therapist Response: Clinician utilized CBT, solution focused, and supportive reflection interventions to support pt in navigation presenting sxs.   Clinician actively greeted patient, engaging in introductory check-in, assessing presenting mood and affect, further eliciting patient's recounts of daily events and factors contributing to presenting moods.  Actively listened to patient's reflections of recent events, utilizing supportive listening, and planning open-ended questions to further evoke patient's thoughts, feelings and perspectives surrounding upcoming transitions and continued stressors.  Utilized Socratic questioning to further prompt greater critical processing of expressed thoughts and feelings in relation to presenting stressors.  Clinician reassessed presentation of sxs and presence of safety concerns. Clinician provided support and empathy to patient during session.  Plan: Return again in 2 weeks.  Diagnosis:  Encounter Diagnoses  Name Primary?   MDD (major depressive disorder), recurrent episode, mild (HCC) Yes   GAD (generalized anxiety disorder)      Collaboration of Care: Other None necessary at this time.  Patient/Guardian was advised Release of Information must be obtained prior to any record release in order to collaborate their care with an outside provider. Patient/Guardian was advised if they have not already done so to contact the registration department to sign all necessary forms in order for us  to release information regarding their care.   Consent: Patient/Guardian gives verbal consent for treatment and assignment of benefits for services provided during this visit. Patient/Guardian expressed understanding and agreed to proceed.   Patsi Boots, MSW, LCSW 10/31/2023,  11:21 AM

## 2023-11-01 ENCOUNTER — Ambulatory Visit (HOSPITAL_COMMUNITY)
Admission: RE | Admit: 2023-11-01 | Discharge: 2023-11-01 | Disposition: A | Discharge status: Home / Self Care | Payer: MEDICAID | Source: Ambulatory Visit | Attending: Family Medicine

## 2023-11-01 DIAGNOSIS — R600 Localized edema: Secondary | ICD-10-CM | POA: Insufficient documentation

## 2023-11-02 ENCOUNTER — Other Ambulatory Visit: Payer: Self-pay | Admitting: Family Medicine

## 2023-11-02 ENCOUNTER — Other Ambulatory Visit (HOSPITAL_COMMUNITY): Payer: Self-pay

## 2023-11-02 DIAGNOSIS — E1165 Type 2 diabetes mellitus with hyperglycemia: Secondary | ICD-10-CM

## 2023-11-04 ENCOUNTER — Other Ambulatory Visit: Payer: Self-pay | Admitting: Family Medicine

## 2023-11-04 ENCOUNTER — Other Ambulatory Visit: Payer: Self-pay

## 2023-11-04 ENCOUNTER — Other Ambulatory Visit (HOSPITAL_COMMUNITY): Payer: Self-pay

## 2023-11-04 DIAGNOSIS — E1165 Type 2 diabetes mellitus with hyperglycemia: Secondary | ICD-10-CM

## 2023-11-04 MED ORDER — LANTUS SOLOSTAR 100 UNIT/ML ~~LOC~~ SOPN: 10.0000 [IU] | PEN_INJECTOR | Freq: Every day | SUBCUTANEOUS | Status: DC

## 2023-11-05 ENCOUNTER — Other Ambulatory Visit (HOSPITAL_COMMUNITY): Payer: Self-pay

## 2023-11-06 ENCOUNTER — Other Ambulatory Visit: Payer: Self-pay

## 2023-11-06 ENCOUNTER — Other Ambulatory Visit (HOSPITAL_COMMUNITY): Payer: Self-pay

## 2023-11-06 DIAGNOSIS — B20 Human immunodeficiency virus [HIV] disease: Secondary | ICD-10-CM

## 2023-11-07 ENCOUNTER — Other Ambulatory Visit: Payer: MEDICAID

## 2023-11-07 ENCOUNTER — Other Ambulatory Visit (HOSPITAL_COMMUNITY): Payer: Self-pay

## 2023-11-07 ENCOUNTER — Other Ambulatory Visit: Payer: Self-pay | Admitting: Family Medicine

## 2023-11-07 ENCOUNTER — Encounter: Payer: Self-pay | Admitting: Family Medicine

## 2023-11-07 DIAGNOSIS — I872 Venous insufficiency (chronic) (peripheral): Secondary | ICD-10-CM

## 2023-11-08 ENCOUNTER — Other Ambulatory Visit: Payer: Self-pay

## 2023-11-08 ENCOUNTER — Ambulatory Visit: Payer: MEDICAID | Admitting: Family Medicine

## 2023-11-08 ENCOUNTER — Other Ambulatory Visit (HOSPITAL_COMMUNITY): Payer: Self-pay

## 2023-11-08 ENCOUNTER — Other Ambulatory Visit: Payer: MEDICAID

## 2023-11-08 DIAGNOSIS — B181 Chronic viral hepatitis B without delta-agent: Secondary | ICD-10-CM

## 2023-11-08 DIAGNOSIS — B20 Human immunodeficiency virus [HIV] disease: Secondary | ICD-10-CM

## 2023-11-11 ENCOUNTER — Other Ambulatory Visit (HOSPITAL_COMMUNITY): Payer: Self-pay

## 2023-11-12 ENCOUNTER — Other Ambulatory Visit (HOSPITAL_COMMUNITY): Payer: Self-pay

## 2023-11-12 ENCOUNTER — Other Ambulatory Visit: Payer: Self-pay

## 2023-11-12 LAB — HEPATITIS B DNA, ULTRAQUANTITATIVE, PCR
Hepatitis B DNA: NOT DETECTED [IU]/mL
Hepatitis B virus DNA: NOT DETECTED {Log_IU}/mL

## 2023-11-12 LAB — T-HELPER CELLS (CD4) COUNT (NOT AT ARMC)
Absolute CD4: 1116 {cells}/uL (ref 490–1740)
CD4 T Helper %: 33 % (ref 30–61)
Total lymphocyte count: 3403 {cells}/uL (ref 850–3900)

## 2023-11-12 LAB — HIV-1 RNA QUANT-NO REFLEX-BLD
HIV 1 RNA Quant: 53 {copies}/mL — ABNORMAL HIGH
HIV-1 RNA Quant, Log: 1.72 {Log_copies}/mL — ABNORMAL HIGH

## 2023-11-12 LAB — HEPATITIS B SURFACE ANTIGEN: Hepatitis B Surface Ag: NONREACTIVE

## 2023-11-12 LAB — HEPATITIS B SURFACE ANTIBODY,QUALITATIVE: Hep B S Ab: NONREACTIVE

## 2023-11-13 ENCOUNTER — Other Ambulatory Visit: Payer: MEDICAID

## 2023-11-13 ENCOUNTER — Ambulatory Visit (HOSPITAL_COMMUNITY): Payer: MEDICAID

## 2023-11-13 ENCOUNTER — Other Ambulatory Visit (HOSPITAL_COMMUNITY): Payer: Self-pay

## 2023-11-13 DIAGNOSIS — I10 Essential (primary) hypertension: Secondary | ICD-10-CM

## 2023-11-13 MED ORDER — OLMESARTAN MEDOXOMIL 20 MG PO TABS
20.0000 mg | ORAL_TABLET | Freq: Every day | ORAL | 0 refills | Status: AC
Start: 2023-11-13 — End: ?
  Filled 2023-11-13: qty 30, 30d supply, fill #0

## 2023-11-13 NOTE — Progress Notes (Signed)
 The 10-year ASCVD risk score (Arnett DK, et al., 2019) is: 24%   Values used to calculate the score:     Age: 49 years     Sex: Male     Is Non-Hispanic African American: Yes     Diabetic: Yes     Tobacco smoker: Yes     Systolic Blood Pressure: 128 mmHg     Is BP treated: Yes     HDL Cholesterol: 42 mg/dL     Total Cholesterol: 199 mg/dL  Arlon Bergamo, BSN, RN

## 2023-11-13 NOTE — Progress Notes (Signed)
   11/13/2023  Patient ID: Barbaraann Bookbinder, male   DOB: Jul 31, 1975, 48 y.o.   MRN: [id number]  Outreach attempt for telephone visit to follow-up on management of diabetes and discuss patient message regarding BP treatment was unsuccessful.  I was able to leave a HIPAA compliant voicemail with my direct phone number and am sending MyChart message to attempt to reschedule visit.  Also routing message to PCP b/c patient is currently not taking medication for BP and stated in in basket message BP readings are currently elevated.  Of note, furosemide  20mg  and olmesartan /hydrochlorothiazide  40/25mg  d/c'ed during April hospitalization due to AKI and have not been resumed.  Most recent BMP from 4/14 reflected eGFR, Scr, and BUN returned to normal limits.  Patient does not see PCP again until next Friday, so I am coordinating to suggest initiating olmesartan  at 20mg  daily dosing until patient is seen next week.  Recommend BP check and CMP at that time.  Linn Rich, PharmD, DPLA

## 2023-11-13 NOTE — Progress Notes (Addendum)
   11/13/2023  Patient ID: Phillip Gill, male   DOB: [birth year], 48 y.o.   MRN: [id number]  Subjective/Objective Patient returned missed call for telephone follow-up visit  Diabetes  -Current medications:  none -Patient states he has not been using Ozempic  0.5mg  weekly or Lantus  in the past 3 weeks due to hypoglycemia -Using [facility] for CGM, and data is linked to [employer]- last 14 days reflects the following: CGM active 97% of the time Average glucose 111 GMI 6.0% High 1%, Target 98%, Low 1% 18 documented readings of BG 53-69 -A1c on 4/8 reflected value of 10.3%  Hypertension -Current medications:  olmesartan  20mg  daily sent to pharmacy for patient to pick up today -Furosemide  20mg  and olmesartan /hydrochlorothiazide  40/25mg  d/c'ed during April hospitalization due to AKI and had not been resumed  -Patient reporting home BP readings of 145-160/100-112 -Most recent BMP from 4/14 reflected eGFR, Scr, and BUN returned to normal limits   Assessment/Plan  Diabetes -Continue to hold all medications until follow-up with PCP  -Continue CGM with Freestyle Libre -Curious if Biktarvy  may have induced accelerated hyperglycemia; not likely since patient has been on medication for a while.  Several case studies do reflect accelerated hyperglycemia with Biktarvy  use with resolve in A1c and glucose to baseline after insulin  therapy.  Recent corticosteroids and Ability may have also contributed to this. -Recommend regular monitoring of A1c (every 3 months)  Hypertension -Continue current regimen until follow-up with PCP next week -Continue to monitor and record BP -Recommend f/u CMP at upcoming visit; based on renal function and BP readings, could increase olmesartan  to 40mg  daily and/or add additional agent (amlodipine  caused swelling in the past)  Follow-up:  1 month  Linn Rich, PharmD, DPLA

## 2023-11-14 NOTE — Progress Notes (Signed)
 Diabetes Self-Management Education  Visit Type: First/Initial  Appt. Start Time: 0915 Appt. End Time: 1017  11/20/2023  [name], identified by name and date of birth, is a 48 y.o. male with a diagnosis of Diabetes: Type 2.   ASSESSMENT  Patient is here today alone. Patient would like to learn more about how to eat healthy for diabetes. Patient lives with his two aunts. Pt reports shared shopping and cooking.  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 Pt reports he avoids pork. Pt reports works 12 hour night shirt 7p-7a with typical intake of 2 meals daily. Pt reports he eats out 3-4 times weekly. Pt reports he is connected to therapy once monthly. All Pt's questions questions were answered during this encounter.   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 Seymour Dapper- sees him every 4 months   Hypertension    IBS (irritable bowel syndrome)    Migraine    "none in years" (02/16/2015   Overweight 07/20/2015   Renal insufficiency 12/29/2013    Medications include:   Current Outpatient Medications:    Continuous Glucose Sensor (FREESTYLE LIBRE 3 SENSOR) MISC, Place 1 sensor on the skin every 14 days. Use to check glucose continuously, Disp: 2 each, Rfl: 4   Semaglutide ,0.25 or 0.5MG /DOS, (OZEMPIC , 0.25 OR 0.5 MG/DOSE,) 2 MG/3ML SOPN, Inject 0.25mg  under the skin once weekly for 4 weeks then inject 0.5mg  under the skin every week for 2 doses, Disp: 3 mL, Rfl: 1   ARIPiprazole  (ABILIFY ) 5 MG tablet, Take 1 tablet (5 mg total) by mouth daily., Disp: 90 tablet, Rfl: 0   bictegravir-emtricitabine -tenofovir  AF (BIKTARVY ) 50-200-25 MG TABS tablet, TAKE 1 TABLET BY MOUTH EVERY DAY, Disp: 30 tablet, Rfl: 5   Blood Glucose  Monitoring Suppl (BLOOD GLUCOSE MONITOR SYSTEM) w/Device KIT, Use to check blood sugar in the morning, at noon, and at bedtime., Disp: 1 kit, Rfl: 0   buPROPion  (WELLBUTRIN  XL) 150 MG 24 hr tablet, Take 1 tablet (150 mg total) by mouth daily., Disp: 90 tablet, Rfl: 0   diclofenac  Sodium (VOLTAREN ) 1 % GEL, Apply 4 grams topically 4 (four) times daily to affected joint., Disp: 100 g, Rfl: 11   Elastic Bandages & Supports (CARPAL TUNNEL WRIST STABILIZER) MISC, 1 each by Does not apply route as needed., Disp: 1 each, Rfl: 0   Elastic Bandages & Supports (MEDICAL COMPRESSION STOCKINGS) MISC, 1 each by Does not apply route daily., Disp: 1 each, Rfl: 0   escitalopram  (LEXAPRO ) 20 MG tablet, Take 1 tablet (20 mg total) by mouth daily., Disp: 90 tablet, Rfl: 0   esomeprazole  (NEXIUM ) 40 MG capsule, Take 1 capsule (40 mg total) by mouth 2 (two) times daily before a meal. (Patient taking differently: Take 40 mg by mouth daily as needed (for acid reflux).), Disp: 180 capsule, Rfl: 3   finasteride  (PROSCAR ) 5 MG tablet, Take 1 tablet (5 mg total) by mouth daily., Disp: 30 tablet, Rfl: 1   insulin  glargine (LANTUS  SOLOSTAR) 100 UNIT/ML Solostar Pen, Inject 10 Units into the skin daily. (Patient not taking: Reported on 11/20/2023), Disp: 15 mL, Rfl:  Insulin  Pen Needle 32G X 4 MM MISC, Use as directed with insulin  (Patient not taking: Reported on 11/13/2023), Disp: 100 each, Rfl: 0   olmesartan  (BENICAR ) 20 MG tablet, Take 1 tablet (20 mg total) by mouth daily., Disp: 30 tablet, Rfl: 0   rosuvastatin  (CRESTOR ) 10 MG tablet, Take 1 tablet (10 mg total) by mouth daily., Disp: 30 tablet, Rfl: 2   tadalafil  (CIALIS ) 10 MG tablet, Take 0.5 tablets (5 mg total) by mouth daily., Disp: 45 tablet, Rfl: 0   Vitamin D , Ergocalciferol , (DRISDOL ) 1.25 MG (50000 UNIT) CAPS capsule, Take 1 capsule (50,000 Units total) by mouth every 7 (seven) days., Disp: 8 capsule, Rfl: 0   Labs noted:   Lab Results  Component Value Date    HGBA1C 10.3 (A) 10/08/2023   Last vitamin D  Lab Results  Component Value Date   VD25OH 8.5 (L) 05/06/2023   CGM Results from download:   % Time CGM active:   92 %   (Goal >70%)  Average glucose:   135 mg/dL for 6.5 days  Glucose management indicator:   6.5 %  Time in range (70-180 mg/dL):   92 %   (Goal >95%)  Time High (181-250 mg/dL):   9 %   (Goal < 18%)  Time Very High (>250 mg/dL):    0 %   (Goal < 5%)  Time Low (54-69 mg/dL):   0 %   (Goal <8%)  Time Very Low (<54 mg/dL):   0 %   (Goal <4%)    There were no vitals taken for this visit. There is no height or weight on file to calculate BMI.  Wt Readings from Last 3 Encounters:  10/22/23 241 lb 3.2 oz (109.4 kg)  10/11/23 246 lb 7.6 oz (111.8 kg)  10/08/23 246 lb 6.4 oz (111.8 kg)      Diabetes Self-Management Education - 11/20/23 0924       Visit Information   Visit Type First/Initial      Initial Visit   Diabetes Type Type 2    Date Diagnosed 2 months ago    Are you currently following a meal plan? No    Are you taking your medications as prescribed? Yes      Health Coping   How would you rate your overall health? Fair      Psychosocial Assessment   Patient Belief/Attitude about Diabetes Other (comment)   mixed   What is the hardest part about your diabetes right now, causing you the most concern, or is the most worrisome to you about your diabetes?   Making healty food and beverage choices;Being active    Self-care barriers None    Self-management support Doctor's office    Other persons present Patient    Patient Concerns Nutrition/Meal planning;Healthy Lifestyle    Special Needs None    Preferred Learning Style No preference indicated    Learning Readiness Change in progress    How often do you need to have someone help you when you read instructions, pamphlets, or other written materials from your doctor or pharmacy? 1 - Never    What is the last grade level you completed in school? college       Pre-Education Assessment   Patient understands the diabetes disease and treatment process. Needs Instruction    Patient understands incorporating nutritional management into lifestyle. Needs Instruction    Patient undertands incorporating physical activity into lifestyle. Needs Instruction    Patient understands using medications safely. Needs  Instruction    Patient understands monitoring blood glucose, interpreting and using results Needs Instruction    Patient understands prevention, detection, and treatment of acute complications. Needs Instruction    Patient understands prevention, detection, and treatment of chronic complications. Needs Instruction    Patient understands how to develop strategies to address psychosocial issues. Needs Instruction    Patient understands how to develop strategies to promote health/change behavior. Needs Instruction      Complications   Last HgB A1C per patient/outside source 10.3 %    How often do you check your blood sugar? > 4 times/day   CGM freestlye   Fasting Blood glucose range (mg/dL) 409-811;914-782;95-621    Postprandial Blood glucose range (mg/dL) 30-865;<78;469-629    Number of hypoglycemic episodes per month 1    Can you tell when your blood sugar is low? Yes    What do you do if your blood sugar is low? dizzy    Number of hyperglycemic episodes ( >200mg /dL): Never    Can you tell when your blood sugar is high? Yes    What do you do if your blood sugar is high? nausea    Have you had a dilated eye exam in the past 12 months? No    Have you had a dental exam in the past 12 months? No    Are you checking your feet? No      Dietary Intake   Breakfast ~ 12 am: steak, fried apples, fried potates, water or coffee with sugar and powdered creamer    Snack (morning) cashews    Snack (afternoon) cashews    Dinner ~ 8:30am: egg, [place] bacon, biscuit or mcdonald steak, egg, cheese bagel    Beverage(s) water      Activity / Exercise   Activity /  Exercise Type ADL's      Patient Education   Previous Diabetes Education No    Disease Pathophysiology Definition of diabetes, type 1 and 2, and the diagnosis of diabetes    Healthy Eating Plate Method;Role of diet in the treatment of diabetes and the relationship between the three main macronutrients and blood glucose level;Food label reading, portion sizes and measuring food.;Reviewed blood glucose goals for pre and post meals and how to evaluate the patients' food intake on their blood glucose level.    Being Active Role of exercise on diabetes management, blood pressure control and cardiac health.    Medications Reviewed patients medication for diabetes, action, purpose, timing of dose and side effects.    Monitoring Identified appropriate SMBG and/or A1C goals.    Acute complications Taught prevention, symptoms, and  treatment of hypoglycemia - the 15 rule.    Chronic complications Relationship between chronic complications and blood glucose control;Dental care;Retinopathy and reason for yearly dilated eye exams    Diabetes Stress and Support Worked with patient to identify barriers to care and solutions;Identified and addressed patients feelings and concerns about diabetes    Lifestyle and Health Coping Lifestyle issues that need to be addressed for better diabetes care      Individualized Goals (developed by patient)   Nutrition Follow meal plan discussed    Medications take my medication as prescribed    Monitoring  Consistenly use CGM    Problem Solving Eating Pattern;Addressing barriers to behavior change    Reducing Risk do foot checks daily;treat hypoglycemia with 15 grams of carbs if blood glucose less than 70mg /dL;examine blood glucose patterns    Health Coping Ask for help with  psychological, social, or emotional issues             Individualized Plan for Diabetes Self-Management Training:   Learning Objective:  Patient will have a greater understanding of diabetes  self-management. Patient education plan is to attend individual and/or group sessions per assessed needs and concerns.   Plan:   Patient Instructions  Aim to eat out 3 days weekly or less Purchase glucose tablets  Expected Outcomes:     Education material provided: ADA - How to Thrive: A Guide for Your Journey with Diabetes, My Plate, Snack sheet, Support group flyer, and Diabetes Resources  If problems or questions, patient to contact team via:  Phone  Future DSME appointment:

## 2023-11-14 NOTE — Telephone Encounter (Signed)
 Noted.

## 2023-11-19 ENCOUNTER — Other Ambulatory Visit: Payer: Self-pay

## 2023-11-19 NOTE — Progress Notes (Signed)
 Specialty Pharmacy Ongoing Clinical Assessment Note  Phillip Gill is a 48 y.o. male who is being followed by the specialty pharmacy service for RxSp HIV   Patient's specialty medication(s) reviewed today: Bictegravir-Emtricitab-Tenofov (Biktarvy )   Missed doses in the last 4 weeks: 0   Patient/Caregiver did not have any additional questions or concerns.   Therapeutic benefit summary: Patient is achieving benefit   Adverse events/side effects summary: No adverse events/side effects   Patient's therapy is appropriate to: Continue    Goals Addressed             This Visit's Progress    Achieve Undetectable HIV Viral Load < 20   Improving    Patient is not on track and improving. Patient will maintain adherence. Patient's viral load as of 11/08/23 was 53 copies/mL.       Maintain optimal adherence to therapy   On track    Patient is on track. Patient will maintain adherence.      Minimize and address adverse drug events/drug interactions   On track    Patient is on track. Patient will be evaluated at upcoming provider appointment to assess progress.         Follow up: 3 months  [facility]

## 2023-11-20 ENCOUNTER — Encounter: Payer: MEDICAID | Attending: Family Medicine | Admitting: Dietician

## 2023-11-20 DIAGNOSIS — E1165 Type 2 diabetes mellitus with hyperglycemia: Secondary | ICD-10-CM | POA: Diagnosis present

## 2023-11-20 NOTE — Patient Instructions (Addendum)
 Aim to eat out 3 days weekly or less Purchase glucose tablets

## 2023-11-21 ENCOUNTER — Other Ambulatory Visit: Payer: Self-pay

## 2023-11-21 ENCOUNTER — Ambulatory Visit (INDEPENDENT_AMBULATORY_CARE_PROVIDER_SITE_OTHER): Payer: MEDICAID | Admitting: Family

## 2023-11-21 ENCOUNTER — Encounter: Payer: Self-pay | Admitting: Family

## 2023-11-21 ENCOUNTER — Other Ambulatory Visit (HOSPITAL_COMMUNITY): Payer: Self-pay

## 2023-11-21 VITALS — BP 138/91 | HR 94 | Temp 97.7°F | Ht 72.0 in | Wt 244.0 lb

## 2023-11-21 DIAGNOSIS — N521 Erectile dysfunction due to diseases classified elsewhere: Secondary | ICD-10-CM

## 2023-11-21 DIAGNOSIS — Z9189 Other specified personal risk factors, not elsewhere classified: Secondary | ICD-10-CM

## 2023-11-21 DIAGNOSIS — Z23 Encounter for immunization: Secondary | ICD-10-CM | POA: Diagnosis not present

## 2023-11-21 DIAGNOSIS — I10 Essential (primary) hypertension: Secondary | ICD-10-CM

## 2023-11-21 DIAGNOSIS — Z0000 Encounter for general adult medical examination without abnormal findings: Secondary | ICD-10-CM

## 2023-11-21 DIAGNOSIS — B20 Human immunodeficiency virus [HIV] disease: Secondary | ICD-10-CM

## 2023-11-21 DIAGNOSIS — B181 Chronic viral hepatitis B without delta-agent: Secondary | ICD-10-CM | POA: Diagnosis not present

## 2023-11-21 DIAGNOSIS — F332 Major depressive disorder, recurrent severe without psychotic features: Secondary | ICD-10-CM

## 2023-11-21 HISTORY — DX: Other specified personal risk factors, not elsewhere classified: Z91.89

## 2023-11-21 MED ORDER — ROSUVASTATIN CALCIUM 10 MG PO TABS
10.0000 mg | ORAL_TABLET | Freq: Every day | ORAL | 2 refills | Status: DC
  Filled 2023-11-21 – 2023-11-26 (×3): qty 30, 30d supply, fill #0
  Filled 2023-12-16 – 2023-12-31 (×3): qty 30, 30d supply, fill #1
  Filled 2024-01-27: qty 30, 30d supply, fill #2

## 2023-11-21 MED ORDER — SILDENAFIL CITRATE 100 MG PO TABS
50.0000 mg | ORAL_TABLET | Freq: Every day | ORAL | 1 refills | Status: DC | PRN
  Filled 2023-11-21: qty 30, 30d supply, fill #0
  Filled 2023-11-22 – 2024-07-14 (×3): qty 30, 30d supply, fill #1

## 2023-11-21 MED ORDER — BIKTARVY 50-200-25 MG PO TABS
ORAL_TABLET | ORAL | 5 refills | Status: DC
  Filled 2023-11-21: qty 30, 30d supply, fill #0
  Filled 2023-12-16 – 2023-12-17 (×2): qty 30, 30d supply, fill #1
  Filled 2024-01-09: qty 30, 30d supply, fill #2
  Filled 2024-01-30 – 2024-02-04 (×2): qty 30, 30d supply, fill #3
  Filled 2024-02-28 – 2024-03-16 (×3): qty 30, 30d supply, fill #4
  Filled 2024-04-14 – 2024-05-04 (×4): qty 30, 30d supply, fill #5

## 2023-11-21 NOTE — Progress Notes (Signed)
 Brief Narrative   Patient ID: Phillip Gill, male    DOB: Mar 07, 1976, 48 y.o.   MRN: [id number]  Phillip Gill is a 48 y/o AA male diagnosed with HIV disease in July 2012 with risk factor of MSM. Initial viral load of 99,000 and CD4 of 510 entering care at Stage 1. [id number] negative. Initial genotype with no significant resistance. Previous ART experience with Prezista , Truvada , and Biktarvy .    Subjective:   Chief Complaint  Patient presents with   Follow-up    B20, Hepatitis B    HPI:  Phillip Gill is a 48 y.o. male with HIV disease and chronic hepatitis B last seen on 08/01/23 with well-controlled virus and good adherence and tolerance to Biktarvy .  Viral load was undetectable with CD4 count 1216.  Hepatitis B was also adequately controlled with Hepatitis B DNA being undetectable. Hepatitis B surface antigen was negative with Hepatitis B surface antibody negative. Here today for follow up.  Phillip Gill has been doing okay since his last office visit and continues to take Biktarvy  as prescribed with no adverse side effects or problems obtaining medication from the pharmacy. Not currently taking blood pressure medication since he was in the hospital. Covered by [facility]. Follows with Psychiatry. Able to get a housing voucher to aid in rent and working to find housing. Working as a Lawyer. Healthcare maintenance reviewed. Condoms and site specific STD testing offered. Due for routine dental care.   Denies fevers, chills, night sweats, headaches, changes in vision, neck pain/stiffness, nausea, diarrhea, vomiting, lesions or rashes.  Lab Results  Component Value Date   CD4TCELL 33 11/08/2023   CD4TABS 1,216 04/01/2023   Lab Results  Component Value Date   HIV1RNAQUANT 53 (H) 11/08/2023     Allergies  Allergen Reactions   Bactrim [Sulfamethoxazole W/Trimethoprim (Co-Trimoxazole)] Hives and Shortness Of Breath   Sulfa Antibiotics Hives and Shortness Of Breath   Cymbalta   [Duloxetine  Hcl] Diarrhea   Duloxetine  Diarrhea   Metformin  And Related Nausea And Vomiting   Other Hives and Swelling    [employer] toothpaste    Pork-Derived Products Other (See Comments)    Pt refused to eat pork products.   Zoloft [Sertraline Hcl] Diarrhea   Clindamycin /Lincomycin Rash   Neurontin  [Gabapentin ] Rash      Outpatient Medications Prior to Visit  Medication Sig Dispense Refill   ARIPiprazole  (ABILIFY ) 5 MG tablet Take 1 tablet (5 mg total) by mouth daily. 90 tablet 0   Blood Glucose Monitoring Suppl (BLOOD GLUCOSE MONITOR SYSTEM) w/Device KIT Use to check blood sugar in the morning, at noon, and at bedtime. 1 kit 0   buPROPion  (WELLBUTRIN  XL) 150 MG 24 hr tablet Take 1 tablet (150 mg total) by mouth daily. 90 tablet 0   Continuous Glucose Sensor (FREESTYLE LIBRE 3 SENSOR) MISC Place 1 sensor on the skin every 14 days. Use to check glucose continuously 2 each 4   Elastic Bandages & Supports (CARPAL TUNNEL WRIST STABILIZER) MISC 1 each by Does not apply route as needed. 1 each 0   Elastic Bandages & Supports (MEDICAL COMPRESSION STOCKINGS) MISC 1 each by Does not apply route daily. 1 each 0   finasteride  (PROSCAR ) 5 MG tablet Take 1 tablet (5 mg total) by mouth daily. 30 tablet 1   olmesartan  (BENICAR ) 20 MG tablet Take 1 tablet (20 mg total) by mouth daily. 30 tablet 0   Semaglutide ,0.25 or 0.5MG /DOS, (OZEMPIC , 0.25 OR 0.5 MG/DOSE,) 2 MG/3ML SOPN Inject 0.25mg  under  the skin once weekly for 4 weeks then inject 0.5mg  under the skin every week for 2 doses 3 mL 1   bictegravir-emtricitabine -tenofovir  AF (BIKTARVY ) 50-200-25 MG TABS tablet TAKE 1 TABLET BY MOUTH EVERY DAY 30 tablet 5   diclofenac  Sodium (VOLTAREN ) 1 % GEL Apply 4 grams topically 4 (four) times daily to affected joint. (Patient not taking: Reported on 11/21/2023) 100 g 11   escitalopram  (LEXAPRO ) 20 MG tablet Take 1 tablet (20 mg total) by mouth daily. (Patient not taking: Reported on 11/21/2023) 90 tablet 0    esomeprazole  (NEXIUM ) 40 MG capsule Take 1 capsule (40 mg total) by mouth 2 (two) times daily before a meal. (Patient taking differently: Take 40 mg by mouth daily as needed (for acid reflux).) 180 capsule 3   insulin  glargine (LANTUS  SOLOSTAR) 100 UNIT/ML Solostar Pen Inject 10 Units into the skin daily. (Patient not taking: Reported on 11/20/2023) 15 mL    Insulin  Pen Needle 32G X 4 MM MISC Use as directed with insulin  (Patient not taking: Reported on 11/13/2023) 100 each 0   tadalafil  (CIALIS ) 10 MG tablet Take 0.5 tablets (5 mg total) by mouth daily. (Patient not taking: Reported on 11/21/2023) 45 tablet 0   Vitamin D , Ergocalciferol , (DRISDOL ) 1.25 MG (50000 UNIT) CAPS capsule Take 1 capsule (50,000 Units total) by mouth every 7 (seven) days. 8 capsule 0   rosuvastatin  (CRESTOR ) 10 MG tablet Take 1 tablet (10 mg total) by mouth daily. (Patient not taking: Reported on 11/21/2023) 30 tablet 2   No facility-administered medications prior to visit.     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 Seymour Dapper- sees him every 4 months   Hypertension    IBS (irritable bowel syndrome)    Migraine    "none in years" (02/16/2015   Overweight 07/20/2015   Renal insufficiency 12/29/2013     Past Surgical History:  Procedure Laterality Date   CO2 LASER APPLICATION N/A 02/11/2013   Procedure: CO2 LASER APPLICATION;  Surgeon: Joyce Nixon, MD;  Location: [facility];  Service: General;  Laterality: N/A;   FOOT SURGERY     HIGH RESOLUTION ANOSCOPY N/A 02/11/2013   Procedure: HIGH RESOLUTION ANOSCOPY WITH BIOPSY, LASER ABLATION;  Surgeon: Joyce Nixon, MD;  Location: [facility];  Service: General;  Laterality: N/A;   WISDOM TOOTH EXTRACTION           Review of Systems  Constitutional:  Negative for appetite change, chills, fatigue, fever and unexpected weight change.  Eyes:  Negative for visual disturbance.  Respiratory:  Negative for cough, chest tightness, shortness of breath and wheezing.   Cardiovascular:  Negative for chest pain and leg swelling.  Gastrointestinal:  Negative for abdominal pain, constipation, diarrhea, nausea and vomiting.  Genitourinary:  Negative for dysuria, flank pain, frequency, genital sores, hematuria and urgency.  Skin:  Negative for rash.  Allergic/Immunologic: Negative for immunocompromised state.  Neurological:  Negative for dizziness and headaches.     Objective:   BP (!) 138/91   Pulse 94   Temp 97.7 F (36.5 C) (Oral)   Ht 6' (1.829 m)   Wt 244 lb (110.7 kg)  SpO2 95%   BMI 33.09 kg/m  Nursing note and vital signs reviewed.  Physical Exam Constitutional:      General: He is not in acute distress.    Appearance: He is well-developed.  Eyes:     Conjunctiva/sclera: Conjunctivae normal.  Cardiovascular:     Rate and Rhythm: Normal rate and regular rhythm.     Heart sounds: Normal heart sounds. No murmur heard.    No friction rub. No gallop.  Pulmonary:     Effort: Pulmonary effort is normal. No respiratory distress.     Breath sounds: Normal breath sounds. No wheezing or rales.  Chest:     Chest wall: No tenderness.  Musculoskeletal:     Cervical back: Neck supple.  Lymphadenopathy:     Cervical: No cervical adenopathy.  Skin:    General: Skin is warm and dry.     Findings: No rash.  Neurological:     Mental Status: He is alert and oriented to person, place, and time.          11/21/2023    9:00 AM 11/20/2023    9:22 AM 10/03/2023    9:09 AM 09/24/2023   10:23 AM 09/09/2023   10:16 AM  Depression screen PHQ 2/9  Decreased Interest 3 1 3     Down, Depressed, Hopeless 3 1 3     PHQ - 2 Score 6 2 6     Altered sleeping 3  2    Tired, decreased energy 3  3     Change in appetite 3  3    Feeling bad or failure about yourself  3  3    Trouble concentrating 3  3    Moving slowly or fidgety/restless 3  3    Suicidal thoughts 3  3    PHQ-9 Score 27  26    Difficult doing work/chores Extremely dIfficult  Extremely dIfficult       Information is confidential and restricted. Go to Review Flowsheets to unlock data.        11/21/2023    9:01 AM 10/03/2023    9:09 AM 09/24/2023   10:19 AM 09/09/2023   10:13 AM  GAD 7 : Generalized Anxiety Score  Nervous, Anxious, on Edge 3 1    Control/stop worrying 3 3    Worry too much - different things 3 3    Trouble relaxing 2 3    Restless 2 2    Easily annoyed or irritable 3 3    Afraid - awful might happen 3 3    Total GAD 7 Score 19 18    Anxiety Difficulty Extremely difficult Extremely difficult       Information is confidential and restricted. Go to Review Flowsheets to unlock data.     The 10-year ASCVD risk score (Arnett DK, et al., 2019) is: 16.7%   Values used to calculate the score:     Age: 21 years     Sex: Male     Is Non-Hispanic African American: Yes     Diabetic: Yes     Tobacco smoker: No     Systolic Blood Pressure: 138 mmHg     Is BP treated: Yes     HDL Cholesterol: 42 mg/dL     Total Cholesterol: 199 mg/dL      Assessment & Plan:    Patient Active Problem List   Diagnosis Date Noted   At increased risk for cardiovascular disease 11/21/2023   Uncontrolled type 2 diabetes mellitus  with hyperglycemia (HCC) 10/12/2023   Leukocytosis 10/12/2023   Hyperlipidemia 10/12/2023   Proteinuria due to type 2 diabetes mellitus (HCC) 10/10/2023   Carpal tunnel syndrome on both sides 10/08/2023   Hyponatremia 10/08/2023   AKI (acute kidney injury) (HCC) 10/08/2023   Type 2 diabetes mellitus with hyperglycemia (HCC) 10/08/2023   Nocturia 04/30/2023   Neck mass 04/01/2023   MDD (major depressive disorder), recurrent severe, without psychosis (HCC) 03/05/2023   Gastroesophageal reflux  disease 03/05/2023   Passive suicidal ideations 02/05/2023   OSA on CPAP 02/05/2023   MDD (major depressive disorder) 12/19/2022   Urinary frequency 12/18/2022   Left foot pain 12/18/2022   Poor social situation 12/18/2022   Snoring 01/22/2022   Encounter for physical examination related to employment 09/26/2021   Human monkeypox 04/06/2021   Cervical pain 02/07/2021   Erectile dysfunction 08/29/2020   Healthcare maintenance 04/05/2020   Foot lesion 12/24/2019   Human immunodeficiency virus (HIV) disease (HCC) 08/25/2019   Gonorrhea 02/11/2019   Hepatitis B surface antigen positive 09/05/2018   Overweight 07/20/2015   Colitis 03/08/2015   HIV (human immunodeficiency virus infection) (HCC)    Tobacco abuse    LGV (lymphogranuloma venereum)    Primary syphilis    Chronic hepatitis B (HCC) 10/03/2014   HTN (hypertension)    Gynecomastia 04/30/2014   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   Chronic pain associated with significant psychosocial dysfunction 09/18/2012   Fibromyalgia muscle pain 02/11/2012   Anxiety and depression 12/07/2011   Other constipation 07/16/2011   Anxiety 02/02/2011     Problem List Items Addressed This Visit       Cardiovascular and Mediastinum   HTN (hypertension) (Chronic)   Blood pressure elevated today and on home readings and prescribed Benicar  and unclear if he is taking it or not.. Has appointment with Internal Medicine tomorrow and defer medication decisions. Encouraged to continue monitoring.       Relevant Medications   rosuvastatin  (CRESTOR ) 10 MG tablet   sildenafil  (VIAGRA ) 100 MG tablet     Digestive   Chronic hepatitis B (HCC) (Chronic)   Hepatitis B continues to show recovered with negative Hepatitis B surface antigen and undetectable Hepatitis B DNA. Covered by tenofovir  in Biktarvy .       Relevant Medications   bictegravir-emtricitabine -tenofovir  AF (BIKTARVY ) 50-200-25 MG TABS tablet     Other   Human immunodeficiency virus (HIV)  disease (HCC) - Primary (Chronic)   Mr. Milos continues to have well controlled virus with good adherence and tolerance to Biktarvy .  Reviewed lab work and discussed plan of care, U equals U, and family planning. Social determinants of health reviewed and no interventions indicated. Check lab work. Continue current dose of Biktarvy . Plan for follow up in  4 months or sooner if needed with lab work on the same day..       Relevant Medications   bictegravir-emtricitabine -tenofovir  AF (BIKTARVY ) 50-200-25 MG TABS tablet   Healthcare maintenance   Discussed importance of safe sexual practice and condom use. Condoms and site specific STD testing offered.  Vaccinations reviewed with tetanus updated.  Due for routine dental care with [facility] information provided in AVS.  Colon cancer screening up to date.       Erectile dysfunction   Previously on sildenafil  and requesting refill of sildenafil  and has not taken taldalafil. Reminded of proper medication administration and avoid taking with nitrates for chest pain and to seek care for priapism.       MDD (major depressive disorder)  Continues have elevated PHQ-9 and maintained on bupropion  and aripiprazole  and followed by Psychiatry.       At increased risk for cardiovascular disease   Mr. Davie has an elevated 10 year ASCVD risk score of 16.7% On statin medication       Other Visit Diagnoses       Need for tetanus, diphtheria, and acellular pertussis (Tdap) vaccine       Relevant Orders   Tdap vaccine greater than or equal to 7yo IM (Completed)        I am having Kenner M. Mcwright "Shan" start on sildenafil . I am also having him maintain his diclofenac  Sodium, esomeprazole , ARIPiprazole , escitalopram , buPROPion , Vitamin D  (Ergocalciferol ), Carpal Tunnel Wrist Stabilizer, Medical Compression Stockings, finasteride , tadalafil , Blood Glucose Monitor System, Insulin  Pen Needle, Ozempic  (0.25 or 0.5 MG/DOSE), FreeStyle Libre 3 Sensor,  Lantus  SoloStar, olmesartan , Biktarvy , and rosuvastatin .   Meds ordered this encounter  Medications   bictegravir-emtricitabine -tenofovir  AF (BIKTARVY ) 50-200-25 MG TABS tablet    Sig: TAKE 1 TABLET BY MOUTH EVERY DAY    Dispense:  30 tablet    Refill:  5    Please mail    Supervising Provider:   Liane Redman [phone]    Prescription Type::   Renewal   rosuvastatin  (CRESTOR ) 10 MG tablet    Sig: Take 1 tablet (10 mg total) by mouth daily.    Dispense:  30 tablet    Refill:  2    Please mail    Supervising Provider:   SNIDER, CYNTHIA [[id number]]   sildenafil  (VIAGRA ) 100 MG tablet    Sig: Take 0.5-1 tablets (50-100 mg total) by mouth daily as needed for erectile dysfunction. Do not exceed 100 mg in a day    Dispense:  30 tablet    Refill:  1    Please mail    Supervising Provider:   Liane Redman [[id number]]     Follow-up: Return in about 6 months (around 05/23/2024). or sooner if needed.    Marlan Silva, MSN, FNP-C Nurse Practitioner [facility] Main number: [phone]

## 2023-11-21 NOTE — Assessment & Plan Note (Signed)
 Mr. Nienhuis has an elevated 10 year ASCVD risk score of 16.7% On statin medication

## 2023-11-21 NOTE — Assessment & Plan Note (Addendum)
 Blood pressure elevated today and on home readings and prescribed Benicar  and unclear if he is taking it or not.. Has appointment with Internal Medicine tomorrow and defer medication decisions. Encouraged to continue monitoring.

## 2023-11-21 NOTE — Assessment & Plan Note (Signed)
 Continues have elevated PHQ-9 and maintained on bupropion  and aripiprazole  and followed by Psychiatry.

## 2023-11-21 NOTE — Assessment & Plan Note (Signed)
 Phillip Gill continues to have well controlled virus with good adherence and tolerance to Biktarvy .  Reviewed lab work and discussed plan of care, U equals U, and family planning. Social determinants of health reviewed and no interventions indicated. Check lab work. Continue current dose of Biktarvy . Plan for follow up in  4 months or sooner if needed with lab work on the same day.Aaron Aas

## 2023-11-21 NOTE — Assessment & Plan Note (Signed)
 Previously on sildenafil  and requesting refill of sildenafil  and has not taken taldalafil. Reminded of proper medication administration and avoid taking with nitrates for chest pain and to seek care for priapism.

## 2023-11-21 NOTE — Assessment & Plan Note (Signed)
 Discussed importance of safe sexual practice and condom use. Condoms and site specific STD testing offered.  Vaccinations reviewed with tetanus updated.  Due for routine dental care with [facility] information provided in AVS.  Colon cancer screening up to date.

## 2023-11-21 NOTE — Assessment & Plan Note (Signed)
 Hepatitis B continues to show recovered with negative Hepatitis B surface antigen and undetectable Hepatitis B DNA. Covered by tenofovir  in Biktarvy .

## 2023-11-21 NOTE — Patient Instructions (Addendum)
Nice to see you.  Continue to take your medication daily as prescribed.  Refills have been sent to the pharmacy.  Please call [facility]) to schedule/follow up on your dental care at [phone]  Plan for follow up in 6 months or sooner if needed with lab work on the same day.  Have a great day and stay safe!

## 2023-11-22 ENCOUNTER — Other Ambulatory Visit (HOSPITAL_COMMUNITY): Payer: Self-pay

## 2023-11-22 ENCOUNTER — Encounter: Payer: Self-pay | Admitting: Family Medicine

## 2023-11-22 ENCOUNTER — Other Ambulatory Visit: Payer: Self-pay

## 2023-11-22 ENCOUNTER — Ambulatory Visit (INDEPENDENT_AMBULATORY_CARE_PROVIDER_SITE_OTHER): Payer: MEDICAID | Admitting: Family Medicine

## 2023-11-22 ENCOUNTER — Other Ambulatory Visit: Payer: Self-pay | Admitting: Family Medicine

## 2023-11-22 VITALS — BP 150/98 | HR 78 | Temp 98.1°F | Resp 18 | Wt 245.6 lb

## 2023-11-22 DIAGNOSIS — I10 Essential (primary) hypertension: Secondary | ICD-10-CM | POA: Diagnosis not present

## 2023-11-22 DIAGNOSIS — N179 Acute kidney failure, unspecified: Secondary | ICD-10-CM | POA: Diagnosis not present

## 2023-11-22 DIAGNOSIS — E1165 Type 2 diabetes mellitus with hyperglycemia: Secondary | ICD-10-CM | POA: Diagnosis not present

## 2023-11-22 DIAGNOSIS — Z794 Long term (current) use of insulin: Secondary | ICD-10-CM

## 2023-11-22 DIAGNOSIS — R809 Proteinuria, unspecified: Secondary | ICD-10-CM

## 2023-11-22 DIAGNOSIS — E1129 Type 2 diabetes mellitus with other diabetic kidney complication: Secondary | ICD-10-CM | POA: Diagnosis not present

## 2023-11-22 MED ORDER — SEMAGLUTIDE (1 MG/DOSE) 4 MG/3ML ~~LOC~~ SOPN
1.0000 mg | PEN_INJECTOR | SUBCUTANEOUS | 0 refills | Status: DC
  Filled 2023-11-22: qty 3, 28d supply, fill #0

## 2023-11-22 MED ORDER — INDAPAMIDE 1.25 MG PO TABS
1.2500 mg | ORAL_TABLET | Freq: Every day | ORAL | 0 refills | Status: DC
  Filled 2023-11-22: qty 90, 90d supply, fill #0

## 2023-11-22 NOTE — Patient Instructions (Addendum)
  VISIT SUMMARY: You came in today for a follow-up on your blood pressure and diabetes management. We discussed your recent blood sugar fluctuations and blood pressure readings.  YOUR PLAN: -TYPE 2 DIABETES MELLITUS WITH HYPERGLYCEMIA: Type 2 diabetes is a condition where your body does not use insulin  properly, leading to high blood sugar levels. We will increase your semaglutide  (Ozempic ) dose to 1 mg weekly to help better control your blood sugar and provide additional benefits like weight loss and protection for your kidneys and heart. Please monitor for any gastrointestinal side effects.  -HYPERTENSION: Hypertension is high blood pressure, which can lead to serious health issues if not managed. Your blood pressure is still elevated, so we will add indapamide 1.25 mg daily to your current medication, olmesartan  20 mg. We will recheck your metabolic panel in one week to monitor your kidney function and electrolytes.  -ACUTE KIDNEY INJURY, RESOLVED: An acute kidney injury is a sudden episode of kidney failure or damage. Your kidney function has returned to normal, but we will monitor it closely after starting indapamide to ensure it remains stable.  INSTRUCTIONS: Please increase your semaglutide  (Ozempic ) dose to 1 mg weekly and start taking indapamide 1.25 mg daily. Continue taking olmesartan  20 mg daily. We will recheck your metabolic panel in one week to monitor your kidney function and electrolytes. Monitor for any gastrointestinal side effects from the increased Ozempic  dose.

## 2023-11-22 NOTE — Progress Notes (Signed)
 Assessment & Plan   Assessment/Plan:    Assessment & Plan Type 2 diabetes mellitus with hyperglycemia Type 2 diabetes mellitus with hyperglycemia, managed with semaglutide  (Ozempic ) and previously with insulin  glargine. Blood glucose levels have fluctuated between 70 and 250 mg/dL. He paused Ozempic , leading to increased glucose levels, but resumed it last week, resulting in improved control. No significant gastrointestinal side effects reported recently. Increasing Ozempic  to 1 mg weekly is expected to enhance glycemic control and provide benefits such as weight loss, renal protection, and cardiovascular protection. Ozempic  is less likely to cause hypoglycemia compared to insulin . - Increase semaglutide  (Ozempic ) to 1 mg weekly - Monitor for gastrointestinal side effects of semaglutide   Hypertension Hypertension, managed with olmesartan  20 mg. Blood pressure remains elevated at 150/98 mmHg. Previous acute kidney injury and hyperglycemia likely contributed to fluctuations. Renal function has normalized. Adding indapamide is expected to improve blood pressure control. Monitoring renal function and electrolytes is necessary after starting indapamide. - Add indapamide 1.25 mg daily - Recheck metabolic panel in one week to monitor renal function and electrolytes - Continue olmesartan  20 mg daily  Acute kidney injury, resolved Previous acute kidney injury has resolved, with recent labs showing normal renal function. Indapamide addition will require monitoring to prevent recurrence. - Recheck metabolic panel in one week to monitor renal function after starting indapamide      Medications Discontinued During This Encounter  Medication Reason   insulin  glargine (LANTUS  SOLOSTAR) 100 UNIT/ML Solostar Pen    Semaglutide ,0.25 or 0.5MG /DOS, (OZEMPIC , 0.25 OR 0.5 MG/DOSE,) 2 MG/3ML SOPN     Return in about 1 month (around 12/23/2023) for BP, DM.        Subjective:   Encounter date:  11/22/2023  Phillip Gill is a 48 y.o. male who has Anxiety; Other constipation; Anxiety and depression; Fibromyalgia muscle pain; Anal condyloma; Gynecomastia; HTN (hypertension); Chronic hepatitis B (HCC); LGV (lymphogranuloma venereum); Primary syphilis; Colitis; HIV (human immunodeficiency virus infection) (HCC); Tobacco abuse; Overweight; Chronic pain associated with significant psychosocial dysfunction; Gonorrhea; Human immunodeficiency virus (HIV) disease (HCC); Foot lesion; Hepatitis B surface antigen positive; Healthcare maintenance; Erectile dysfunction; Cervical pain; Human monkeypox; Encounter for physical examination related to employment; Snoring; Urinary frequency; Left foot pain; Poor social situation; MDD (major depressive disorder); Passive suicidal ideations; OSA on CPAP; MDD (major depressive disorder), recurrent severe, without psychosis (HCC); Gastroesophageal reflux disease; Neck mass; Nocturia; Carpal tunnel syndrome on both sides; Hyponatremia; AKI (acute kidney injury) (HCC); Type 2 diabetes mellitus with hyperglycemia (HCC); 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   He  has a past medical history of Anxiety, Arthritis, Chronic hepatitis B (HCC), Chronic lower back pain, Depression, Fibromyalgia, Genital warts, HIV disease (HCC) (02/28/2015), HIV infection (HCC), Hypertension, IBS (irritable bowel syndrome), Migraine, Overweight (07/20/2015), and Renal insufficiency (12/29/2013).Shya Kovatch Aas   He presents with chief complaint of Diabetes (1 month follow up DM. Pt is fasting today //HM due- diabetic eye and foot exam (ophthalmologist referral is needed) ) and Hypertension (Pt c/o of elevated HTN with reading ranging from 162/117 and 172/113. Pt was prescribed olmesartan  25mg ) .   Discussed the use of AI scribe  software for clinical note transcription with the patient, who gave verbal consent to proceed.  History of Present Illness Phillip Gill "Phillip Gill" is a 48 year old male with hypertension and diabetes who presents for follow-up on blood pressure and  diabetes management.  He has experienced fluctuations in blood sugar levels over the past few weeks. Initially, his blood sugar was dropping, but after pausing Ozempic , it began to rise again. Resuming Ozempic  last week has helped stabilize his blood sugar levels. His home blood sugar readings have ranged from 70 to 180 mg/dL, with occasional highs between 180 and 250 mg/dL. He is currently taking 0.5 mg of semaglutide  (Ozempic ) and Lantus . Occasional diarrhea is noted, but no significant gastrointestinal symptoms recently.  Regarding hypertension, he is taking olmesartan  20 mg. His blood pressure was elevated today at 150/98 mmHg and has been higher at home as well. He has a history of hyperglycemia and acute kidney injury, attributed to diabetes and medication changes.  He experiences foot pain when standing for extended periods, which he associates with fluid buildup. Similar issues in the past were managed with diuretics. He does not currently have noticeable swelling in his legs.     ROS  Past Surgical History:  Procedure Laterality Date   CO2 LASER APPLICATION N/A 02/11/2013   Procedure: CO2 LASER APPLICATION;  Surgeon: Joyce Nixon, MD;  Location: [facility];  Service: General;  Laterality: N/A;   FOOT SURGERY     HIGH RESOLUTION ANOSCOPY N/A 02/11/2013   Procedure: HIGH RESOLUTION ANOSCOPY WITH BIOPSY, LASER ABLATION;  Surgeon: Joyce Nixon, MD;  Location: [facility];  Service: General;  Laterality: N/A;   WISDOM TOOTH EXTRACTION      Outpatient Medications Prior to Visit  Medication Sig Dispense Refill   ARIPiprazole  (ABILIFY ) 5 MG tablet Take 1 tablet (5 mg total) by mouth daily. 90 tablet 0    bictegravir-emtricitabine -tenofovir  AF (BIKTARVY ) 50-200-25 MG TABS tablet TAKE 1 TABLET BY MOUTH EVERY DAY 30 tablet 5   Blood Glucose Monitoring Suppl (BLOOD GLUCOSE MONITOR SYSTEM) w/Device KIT Use to check blood sugar in the morning, at noon, and at bedtime. 1 kit 0   buPROPion  (WELLBUTRIN  XL) 150 MG 24 hr tablet Take 1 tablet (150 mg total) by mouth daily. 90 tablet 0   Continuous Glucose Sensor (FREESTYLE LIBRE 3 SENSOR) MISC Place 1 sensor on the skin every 14 days. Use to check glucose continuously 2 each 4   diclofenac  Sodium (VOLTAREN ) 1 % GEL Apply 4 grams topically 4 (four) times daily to affected joint. 100 g 11   Elastic Bandages & Supports (CARPAL TUNNEL WRIST STABILIZER) MISC 1 each by Does not apply route as needed. 1 each 0   Elastic Bandages & Supports (MEDICAL COMPRESSION STOCKINGS) MISC 1 each by Does not apply route daily. 1 each 0   escitalopram  (LEXAPRO ) 20 MG tablet Take 1 tablet (20 mg total) by mouth daily. 90 tablet 0   esomeprazole  (NEXIUM ) 40 MG capsule Take 1 capsule (40 mg total) by mouth 2 (two) times daily before a meal. (Patient taking differently: Take 40 mg by mouth daily as needed (for acid reflux).) 180 capsule 3   finasteride  (PROSCAR ) 5 MG tablet Take 1 tablet (5 mg total) by mouth daily. 30 tablet 1   Insulin  Pen Needle 32G X 4 MM MISC Use as directed with insulin  100 each 0   olmesartan  (BENICAR ) 20 MG tablet Take 1 tablet (20 mg total) by mouth daily. 30 tablet 0   rosuvastatin  (CRESTOR ) 10 MG tablet Take 1 tablet (10 mg total) by mouth daily. 30 tablet 2   sildenafil  (VIAGRA ) 100 MG tablet Take 0.5-1 tablets (50-100 mg total) by mouth daily as needed for erectile dysfunction. Do  not exceed 100 mg in a day 30 tablet 1   tadalafil  (CIALIS ) 10 MG tablet Take 0.5 tablets (5 mg total) by mouth daily. 45 tablet 0   Vitamin D , Ergocalciferol , (DRISDOL ) 1.25 MG (50000 UNIT) CAPS capsule Take 1 capsule (50,000 Units total) by mouth every 7 (seven) days. 8 capsule 0    insulin  glargine (LANTUS  SOLOSTAR) 100 UNIT/ML Solostar Pen Inject 10 Units into the skin daily. 15 mL    Semaglutide ,0.25 or 0.5MG /DOS, (OZEMPIC , 0.25 OR 0.5 MG/DOSE,) 2 MG/3ML SOPN Inject 0.25mg  under the skin once weekly for 4 weeks then inject 0.5mg  under the skin every week for 2 doses 3 mL 1   No facility-administered medications prior to visit.    Family History  Problem Relation Age of Onset   Hypertension Mother    Diabetes Mother    Stroke Mother        cerbral aneurysm   Pancreatic cancer Mother    Colon cancer Father    Hypertension Brother    Sleep apnea Cousin    Mental illness Neg Hx    Stomach cancer Neg Hx    Esophageal cancer Neg Hx     Social History   Socioeconomic History   Marital status: Single    Spouse name: Not on file   Number of children: Not on file   Years of education: Not on file   Highest education level: Not on file  Occupational History   Not on file  Tobacco Use   Smoking status: Former    Current packs/day: 0.00    Average packs/day: 1 pack/day for 18.0 years (18.0 ttl pk-yrs)    Types: Cigarettes    Start date: 07/03/2003    Quit date: 07/02/2021    Years since quitting: 2.3    Passive exposure: Never   Smokeless tobacco: Never   Tobacco comments:    Pt vapes everyday  Vaping Use   Vaping status: Former   Quit date: 12/01/2022   Substances: Nicotine   Substance and Sexual Activity   Alcohol use: Yes    Comment: occasional use   Drug use: No   Sexual activity: Not on file    Comment: accepted condoms  Other Topics Concern   Not on file  Social History Narrative   Grew up in [place], now living in [place],-  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    Finished HS.   Doing online classes with [employer]- studying accounting   Previously 2 years of classes at [employer].    Has Partner- [name]- together for 1 year.    Has 2 girls- (born in 2000).          Social Drivers of Corporate investment banker  Strain: High Risk (02/05/2023)   Overall Financial Resource Strain (CARDIA)    Difficulty of Paying Living Expenses: Very hard  Food Insecurity: Food Insecurity Present (11/20/2023)   Hunger Vital Sign    Worried About Running Out of Food in the Last Year: Sometimes true    Ran Out of Food in the Last Year: Sometimes true  Transportation Needs: No Transportation Needs (10/12/2023)   PRAPARE - Administrator, Civil Service (Medical): No    Lack of Transportation (Non-Medical): No  Physical Activity: Sufficiently Active (07/16/2018)   Received from [facility], [facility]   Exercise Vital Sign    Days of Exercise per Week: 4 days    Minutes of Exercise per Session:  150+ min  Stress: Stress Concern Present (03/01/2023)   [employer] of Occupational Health - Occupational Stress Questionnaire    Feeling of Stress : To some extent  Social Connections: Moderately Isolated (10/12/2023)   Social Connection and Isolation Panel [NHANES]    Frequency of Communication with Friends and Family: Never    Frequency of Social Gatherings with Friends and Family: More than three times a week    Attends Religious Services: 1 to 4 times per year    Active Member of [employer] or Organizations: No    Attends Banker Meetings: Never    Marital Status: Never married  Intimate Partner Violence: Not At Risk (10/12/2023)   Humiliation, Afraid, Rape, and Kick questionnaire    Fear of Current or Ex-Partner: No    Emotionally Abused: No    Physically Abused: No    Sexually Abused: No                                                                                                  Objective:  Physical Exam: BP (!) 150/98 (BP Location: Left Arm, Patient Position: Sitting, Cuff Size: Large) Comment: recheck after resting done manual  Pulse 78   Temp 98.1 F (36.7 C) (Temporal)   Resp 18   Wt 245 lb 9.6 oz (111.4 kg)   SpO2 97%   BMI 33.31 kg/m   Wt  Readings from Last 3 Encounters:  11/22/23 245 lb 9.6 oz (111.4 kg)  11/21/23 244 lb (110.7 kg)  10/22/23 241 lb 3.2 oz (109.4 kg)    Physical Exam VITALS: BP- 150/98 GENERAL: Alert, cooperative, well developed, no acute distress HEENT: Normocephalic, normal oropharynx, moist mucous membranes CHEST: Clear to auscultation bilaterally, no wheezes, rhonchi, or crackles CARDIOVASCULAR: Normal heart rate and rhythm, S1 and S2 normal without murmurs ABDOMEN: Soft, non-tender, non-distended, without organomegaly, normal bowel sounds EXTREMITIES: No cyanosis or edema NEUROLOGICAL: Cranial nerves grossly intact, moves all extremities without gross motor or sensory deficit     VAS US  LOWER EXTREMITY VENOUS REFLUX Result Date: 11/01/2023  Lower Venous Reflux Study Patient Name:  SHIQUAN MATHIEU  Date of Exam:   11/01/2023 Medical Rec #: [id number]          Accession #:    [id number] Date of Birth: [birth year]         Patient Gender: M Patient Age:   68 years Exam Location:  [address] Procedure:      VAS US  LOWER EXTREMITY VENOUS REFLUX Referring Phys: Kasandra Pain --------------------------------------------------------------------------------  Indications: Edema.  Performing Technologist: Aloma Arrant RVS  Examination Guidelines: A complete evaluation includes B-mode imaging, spectral Doppler, color Doppler, and power Doppler as needed of all accessible portions of each vessel. Bilateral testing is considered an integral part of a complete examination. Limited examinations for reoccurring indications may be performed as noted. The reflux portion of the exam is performed with the patient in reverse Trendelenburg. Significant venous reflux is defined as >500 ms in the superficial venous system, and >1 second in the deep venous system.  Venous Reflux Times +--------------+---------+------+-----------+------------+--------+ RIGHT  Reflux NoRefluxReflux TimeDiameter cmsComments                          Yes                                  +--------------+---------+------+-----------+------------+--------+ CFV                     yes   >1 second                      +--------------+---------+------+-----------+------------+--------+ FV mid        no                                             +--------------+---------+------+-----------+------------+--------+ Popliteal     no                                             +--------------+---------+------+-----------+------------+--------+ GSV at SFJ    no                            .33              +--------------+---------+------+-----------+------------+--------+ GSV prox thighno                            .29              +--------------+---------+------+-----------+------------+--------+ GSV mid thigh no                            .30              +--------------+---------+------+-----------+------------+--------+ GSV dist thighno                            .23              +--------------+---------+------+-----------+------------+--------+ GSV at knee   no                            .37              +--------------+---------+------+-----------+------------+--------+ GSV prox calf no                            .33              +--------------+---------+------+-----------+------------+--------+ SSV Pop Fossa           yes    >500 ms      .43              +--------------+---------+------+-----------+------------+--------+ SSV prox calf           yes    >500 ms      .35              +--------------+---------+------+-----------+------------+--------+  +--------------+---------+------+-----------+------------+--------+ LEFT          Reflux NoRefluxReflux TimeDiameter cmsComments  Yes                                  +--------------+---------+------+-----------+------------+--------+ CFV           no                                              +--------------+---------+------+-----------+------------+--------+ FV mid        no                                             +--------------+---------+------+-----------+------------+--------+ Popliteal     no                                             +--------------+---------+------+-----------+------------+--------+ GSV at SFJ    no                            .60              +--------------+---------+------+-----------+------------+--------+ GSV prox thighno                            .22              +--------------+---------+------+-----------+------------+--------+ GSV mid thigh no                            .30              +--------------+---------+------+-----------+------------+--------+ GSV dist thighno                            .32              +--------------+---------+------+-----------+------------+--------+ GSV at knee   no                            .24              +--------------+---------+------+-----------+------------+--------+ GSV prox calf no                            .26              +--------------+---------+------+-----------+------------+--------+ SSV Pop Fossa no                            .35              +--------------+---------+------+-----------+------------+--------+ SSV prox calf no                            .37              +--------------+---------+------+-----------+------------+--------+   Summary: Right: - No evidence of deep vein thrombosis seen in the right lower extremity, from the common femoral through the popliteal veins. - Venous reflux is noted  in the right common femoral vein. - Venous reflux is noted in the right short saphenous vein.  Left: - No evidence of deep vein thrombosis seen in the left lower extremity, from the common femoral through the popliteal veins. - There is no evidence of venous reflux seen in the left lower extremity.  *See table(s) above for measurements and  observations. Electronically signed by Delaney Fearing on 11/01/2023 at 1:34:02 PM.    Final     Recent Results (from the past 2160 hours)  CBC with Differential     Status: Abnormal   Collection Time: 09/16/23  8:09 AM  Result Value Ref Range   WBC 10.6 (H) 4.0 - 10.5 K/uL   RBC 4.79 4.22 - 5.81 MIL/uL   Hemoglobin 13.9 13.0 - 17.0 g/dL   HCT 16.1 09.6 - 04.5 %   MCV 89.4 80.0 - 100.0 fL   MCH 29.0 26.0 - 34.0 pg   MCHC 32.5 30.0 - 36.0 g/dL   RDW 40.9 81.1 - 91.4 %   Platelets 254 150 - 400 K/uL   nRBC 0.0 0.0 - 0.2 %   Neutrophils Relative % 55 %   Neutro Abs 5.9 1.7 - 7.7 K/uL   Lymphocytes Relative 34 %   Lymphs Abs 3.6 0.7 - 4.0 K/uL   Monocytes Relative 9 %   Monocytes Absolute 1.0 0.1 - 1.0 K/uL   Eosinophils Relative 1 %   Eosinophils Absolute 0.1 0.0 - 0.5 K/uL   Basophils Relative 0 %   Basophils Absolute 0.0 0.0 - 0.1 K/uL   Immature Granulocytes 1 %   Abs Immature Granulocytes 0.05 0.00 - 0.07 K/uL    Comment: Performed at [facility], [address] [place], [place] [address]  Comprehensive metabolic panel     Status: Abnormal   Collection Time: 09/16/23  8:09 AM  Result Value Ref Range   Sodium 138 135 - 145 mmol/L   Potassium 3.8 3.5 - 5.1 mmol/L   Chloride 104 98 - 111 mmol/L   CO2 30 22 - 32 mmol/L   Glucose, Bld 131 (H) 70 - 99 mg/dL    Comment: Glucose reference range applies only to samples taken after fasting for at least 8 hours.   BUN 10 6 - 20 mg/dL   Creatinine, Ser 6.21 0.61 - 1.24 mg/dL   Calcium  9.4 8.9 - 10.3 mg/dL   Total Protein 8.2 (H) 6.5 - 8.1 g/dL   Albumin 4.5 3.5 - 5.0 g/dL   AST 21 15 - 41 U/L   ALT 19 0 - 44 U/L   Alkaline Phosphatase 84 38 - 126 U/L   Total Bilirubin 0.5 0.0 - 1.2 mg/dL   GFR, Estimated >30 >86 mL/min    Comment: (NOTE) Calculated using the CKD-EPI Creatinine Equation (2021)    Anion gap 4 (L) 5 - 15    Comment: Performed at [facility], [address],  [place], [place] [address]  B Nat Peptide     Status: None   Collection Time: 09/19/23  9:46 AM  Result Value Ref Range   Pro B Natriuretic peptide (BNP) 18.0 0.0 - 100.0 pg/mL  Thyroid  Panel With TSH     Status: None   Collection Time: 09/19/23  9:46 AM  Result Value Ref Range   T3 Uptake 27 22 - 35 %   T4, Total 6.0 4.9 - 10.5 mcg/dL   Free Thyroxine Index 1.6 1.4 - 3.8   TSH 1.39 0.40 - 4.50  mIU/L  Urinalysis w microscopic + reflex cultur     Status: None   Collection Time: 09/19/23  9:46 AM   Specimen: Blood  Result Value Ref Range   Color, Urine YELLOW YELLOW   APPearance CLEAR CLEAR   Specific Gravity, Urine 1.008 1.001 - 1.035   pH 5.5 5.0 - 8.0   Glucose, UA NEGATIVE NEGATIVE   Bilirubin Urine NEGATIVE NEGATIVE   Ketones, ur NEGATIVE NEGATIVE   Hgb urine dipstick NEGATIVE NEGATIVE   Protein, ur NEGATIVE NEGATIVE   Nitrites, Initial NEGATIVE NEGATIVE   Leukocyte Esterase NEGATIVE NEGATIVE   WBC, UA NONE SEEN 0 - 5 /HPF   RBC / HPF NONE SEEN 0 - 2 /HPF   Squamous Epithelial / HPF NONE SEEN < OR = 5 /HPF   Bacteria, UA NONE SEEN NONE SEEN /HPF   Hyaline Cast NONE SEEN NONE SEEN /LPF   Note      Comment: This urine was analyzed for the presence of WBC,  RBC, bacteria, casts, and other formed elements.  Only those elements seen were reported. Rahman Ferrall Aas .   Microalbumin / creatinine urine ratio     Status: None   Collection Time: 09/19/23  9:46 AM  Result Value Ref Range   Microalb, Ur <0.7 mg/dL   Creatinine,U 16.1 mg/dL   Microalb Creat Ratio Unable to calculate 0.0 - 30.0 mg/g    Comment: Unable to Calculate due to Microalbumin Result of <0.7 mg/dL  REFLEXIVE URINE CULTURE     Status: None   Collection Time: 09/19/23  9:46 AM  Result Value Ref Range   Reflexve Urine Culture      Comment: NO CULTURE INDICATED  Basic Metabolic Panel (BMET)     Status: Abnormal   Collection Time: 10/07/23  8:08 AM  Result Value Ref Range   Sodium 127 (L) 135 - 145 mEq/L   Potassium 4.2 3.5  - 5.1 mEq/L   Chloride 87 (L) 96 - 112 mEq/L   CO2 28 19 - 32 mEq/L   Glucose, Bld 782 (HH) 70 - 99 mg/dL   BUN 20 6 - 23 mg/dL   Creatinine, Ser 0.96 (H) 0.40 - 1.50 mg/dL   GFR 04.54 (L) >09.81 mL/min    Comment: Calculated using the CKD-EPI Creatinine Equation (2021)   Calcium  9.2 8.4 - 10.5 mg/dL  Urinalysis, Routine w reflex microscopic -Urine, Clean Catch     Status: Abnormal   Collection Time: 10/07/23  6:10 PM  Result Value Ref Range   Color, Urine YELLOW YELLOW   APPearance CLEAR CLEAR   Specific Gravity, Urine 1.010 1.005 - 1.030   pH 5.5 5.0 - 8.0   Glucose, UA >=500 (A) NEGATIVE mg/dL   Hgb urine dipstick NEGATIVE NEGATIVE   Bilirubin Urine NEGATIVE NEGATIVE   Ketones, ur NEGATIVE NEGATIVE mg/dL   Protein, ur NEGATIVE NEGATIVE mg/dL   Nitrite NEGATIVE NEGATIVE   Leukocytes,Ua NEGATIVE NEGATIVE    Comment: Performed at [facility], [address] [place], [place] [address]  CBG monitoring, ED     Status: Abnormal   Collection Time: 10/07/23  6:10 PM  Result Value Ref Range   Glucose-Capillary >600 (HH) 70 - 99 mg/dL    Comment: Glucose reference range applies only to samples taken after fasting for at least 8 hours.  Urinalysis, Microscopic (reflex)     Status: None   Collection Time: 10/07/23  6:10 PM  Result Value Ref Range   RBC / HPF 0-5 0 -  5 RBC/hpf   WBC, UA 0-5 0 - 5 WBC/hpf   Bacteria, UA NONE SEEN NONE SEEN   Squamous Epithelial / HPF NONE SEEN 0 - 5 /HPF    Comment: Performed at [facility], [address] [place], [place] [address]  Basic metabolic panel     Status: Abnormal   Collection Time: 10/07/23  6:18 PM  Result Value Ref Range   Sodium 127 (L) 135 - 145 mmol/L   Potassium 4.0 3.5 - 5.1 mmol/L   Chloride 88 (L) 98 - 111 mmol/L   CO2 26 22 - 32 mmol/L   Glucose, Bld 707 (HH) 70 - 99 mg/dL    Comment: RESULT CALLED TO, READ BACK BY AND VERIFIED WITH SOPHIA GOUGE @ 1912 10/07/2023 PERRY, J. Glucose reference range  applies only to samples taken after fasting for at least 8 hours.    BUN 22 (H) 6 - 20 mg/dL   Creatinine, Ser 6.04 (H) 0.61 - 1.24 mg/dL   Calcium  8.7 (L) 8.9 - 10.3 mg/dL   GFR, Estimated 52 (L) >60 mL/min    Comment: (NOTE) Calculated using the CKD-EPI Creatinine Equation (2021)    Anion gap 13 5 - 15    Comment: Performed at [facility], [address] [place], [place] [address]  CBC     Status: Abnormal   Collection Time: 10/07/23  6:18 PM  Result Value Ref Range   WBC 12.8 (H) 4.0 - 10.5 K/uL   RBC 4.73 4.22 - 5.81 MIL/uL   Hemoglobin 13.7 13.0 - 17.0 g/dL   HCT 11.9 14.7 - 82.9 %   MCV 83.1 80.0 - 100.0 fL   MCH 29.0 26.0 - 34.0 pg   MCHC 34.9 30.0 - 36.0 g/dL   RDW 56.2 13.0 - 86.5 %   Platelets 230 150 - 400 K/uL   nRBC 0.0 0.0 - 0.2 %    Comment: Performed at [facility], [address] [place], [place] [address]  I-Stat venous blood gas, ([facility], [facility], [facility])     Status: Abnormal   Collection Time: 10/07/23  6:20 PM  Result Value Ref Range   pH, Ven 7.413 7.25 - 7.43   pCO2, Ven 43.9 (L) 44 - 60 mmHg   pO2, Ven 60 (H) 32 - 45 mmHg   Bicarbonate 28.0 20.0 - 28.0 mmol/L   TCO2 29 22 - 32 mmol/L   O2 Saturation 91 %   Acid-Base Excess 3.0 (H) 0.0 - 2.0 mmol/L   Sodium 130 (L) 135 - 145 mmol/L   Potassium 4.2 3.5 - 5.1 mmol/L   Calcium , Ion 1.09 (L) 1.15 - 1.40 mmol/L   HCT 43.0 39.0 - 52.0 %   Hemoglobin 14.6 13.0 - 17.0 g/dL   Collection site IV start    Drawn by Nurse    Sample type VENOUS   Beta-hydroxybutyric acid     Status: None   Collection Time: 10/07/23  7:30 PM  Result Value Ref Range   Beta-Hydroxybutyric Acid 0.11 0.05 - 0.27 mmol/L    Comment: Performed at [facility], [address] [place], [place] [address]  CBG monitoring, ED     Status: Abnormal   Collection Time: 10/07/23  8:32 PM  Result Value Ref Range   Glucose-Capillary >600 (HH) 70 - 99 mg/dL    Comment: Glucose reference range applies only to samples  taken after fasting for at least 8 hours.  CBG  monitoring, ED     Status: Abnormal   Collection Time: 10/07/23  9:26 PM  Result Value Ref Range   Glucose-Capillary 559 (HH) 70 - 99 mg/dL    Comment: Glucose reference range applies only to samples taken after fasting for at least 8 hours.   Comment 1 Notify RN   CBG monitoring, ED     Status: Abnormal   Collection Time: 10/07/23  9:59 PM  Result Value Ref Range   Glucose-Capillary 523 (HH) 70 - 99 mg/dL    Comment: Glucose reference range applies only to samples taken after fasting for at least 8 hours.   Comment 1 Notify RN   CBG monitoring, ED     Status: Abnormal   Collection Time: 10/07/23 10:28 PM  Result Value Ref Range   Glucose-Capillary 456 (H) 70 - 99 mg/dL    Comment: Glucose reference range applies only to samples taken after fasting for at least 8 hours.  CBG monitoring, ED     Status: Abnormal   Collection Time: 10/07/23 10:59 PM  Result Value Ref Range   Glucose-Capillary 369 (H) 70 - 99 mg/dL    Comment: Glucose reference range applies only to samples taken after fasting for at least 8 hours.   Comment 1 Notify RN   POCT glucose (manual entry)     Status: Abnormal   Collection Time: 10/08/23 11:07 AM  Result Value Ref Range   POC Glucose 432 (A) 70 - 99 mg/dl  POCT glycosylated hemoglobin (Hb A1C)     Status: Abnormal   Collection Time: 10/08/23 11:09 AM  Result Value Ref Range   Hemoglobin A1C 10.3 (A) 4.0 - 5.6 %   HbA1c POC (<> result, manual entry)     HbA1c, POC (prediabetic range)     HbA1c, POC (controlled diabetic range)    C-peptide     Status: None   Collection Time: 10/08/23 11:21 AM  Result Value Ref Range   C-Peptide 2.87 0.80 - 3.85 ng/mL  Basic Metabolic Panel (BMET)     Status: Abnormal   Collection Time: 10/08/23 11:21 AM  Result Value Ref Range   Sodium 130 (L) 135 - 145 mEq/L   Potassium 4.4 3.5 - 5.1 mEq/L   Chloride 94 (L) 96 - 112 mEq/L   CO2 27 19 - 32 mEq/L   Glucose, Bld 523 (HH)  70 - 99 mg/dL   BUN 18 6 - 23 mg/dL   Creatinine, Ser 2.95 0.40 - 1.50 mg/dL   GFR 62.13 >08.65 mL/min    Comment: Calculated using the CKD-EPI Creatinine Equation (2021)   Calcium  9.1 8.4 - 10.5 mg/dL  Urinalysis w microscopic + reflex cultur     Status: Abnormal   Collection Time: 10/08/23 11:22 AM   Specimen: Urine  Result Value Ref Range   Color, Urine YELLOW YELLOW   APPearance CLEAR CLEAR   Specific Gravity, Urine 1.036 (H) 1.001 - 1.035   pH 6.0 5.0 - 8.0   Glucose, UA 3+ (A) NEGATIVE   Bilirubin Urine NEGATIVE NEGATIVE   Ketones, ur NEGATIVE NEGATIVE   Hgb urine dipstick NEGATIVE NEGATIVE   Protein, ur TRACE (A) NEGATIVE   Nitrites, Initial NEGATIVE NEGATIVE   Leukocyte Esterase NEGATIVE NEGATIVE   WBC, UA NONE SEEN 0 - 5 /HPF   RBC / HPF NONE SEEN 0 - 2 /HPF   Squamous Epithelial / HPF NONE SEEN < OR = 5 /HPF   Bacteria, UA NONE SEEN NONE SEEN /HPF  Hyaline Cast NONE SEEN NONE SEEN /LPF   Note      Comment: This urine was analyzed for the presence of WBC,  RBC, bacteria, casts, and other formed elements.  Only those elements seen were reported. Kwanza Cancelliere Aas .   Microalbumin / creatinine urine ratio     Status: Abnormal   Collection Time: 10/08/23 11:22 AM  Result Value Ref Range   Microalb, Ur 3.8 (H) 0.0 - 1.9 mg/dL   Creatinine,U 19.1 mg/dL   Microalb Creat Ratio 76.3 (H) 0.0 - 30.0 mg/g  REFLEXIVE URINE CULTURE     Status: None   Collection Time: 10/08/23 11:22 AM  Result Value Ref Range   Reflexve Urine Culture      Comment: NO CULTURE INDICATED  CBG monitoring, ED     Status: Abnormal   Collection Time: 10/11/23  4:55 PM  Result Value Ref Range   Glucose-Capillary 479 (H) 70 - 99 mg/dL    Comment: Glucose reference range applies only to samples taken after fasting for at least 8 hours.  Lipase, blood     Status: None   Collection Time: 10/11/23  5:12 PM  Result Value Ref Range   Lipase 40 11 - 51 U/L    Comment: Performed at [facility], [address] [place], [place] [address]  Comprehensive metabolic panel     Status: Abnormal   Collection Time: 10/11/23  5:12 PM  Result Value Ref Range   Sodium 131 (L) 135 - 145 mmol/L   Potassium 3.6 3.5 - 5.1 mmol/L   Chloride 91 (L) 98 - 111 mmol/L   CO2 25 22 - 32 mmol/L   Glucose, Bld 450 (H) 70 - 99 mg/dL    Comment: Glucose reference range applies only to samples taken after fasting for at least 8 hours.   BUN 30 (H) 6 - 20 mg/dL   Creatinine, Ser 5.62 (H) 0.61 - 1.24 mg/dL   Calcium  9.4 8.9 - 10.3 mg/dL   Total Protein 8.7 (H) 6.5 - 8.1 g/dL   Albumin 4.2 3.5 - 5.0 g/dL   AST 20 15 - 41 U/L   ALT 20 0 - 44 U/L   Alkaline Phosphatase 97 38 - 126 U/L   Total Bilirubin 1.0 0.0 - 1.2 mg/dL   GFR, Estimated 43 (L) >60 mL/min    Comment: (NOTE) Calculated using the CKD-EPI Creatinine Equation (2021)    Anion gap 15 5 - 15    Comment: Performed at [facility], [address], [place], [place] [address]  CBC     Status: Abnormal   Collection Time: 10/11/23  5:12 PM  Result Value Ref Range   WBC 14.7 (H) 4.0 - 10.5 K/uL   RBC 5.23 4.22 - 5.81 MIL/uL   Hemoglobin 14.9 13.0 - 17.0 g/dL   HCT 57.8 46.9 - 62.9 %   MCV 84.9 80.0 - 100.0 fL   MCH 28.5 26.0 - 34.0 pg   MCHC 33.6 30.0 - 36.0 g/dL   RDW 52.8 41.3 - 24.4 %   Platelets 263 150 - 400 K/uL   nRBC 0.0 0.0 - 0.2 %    Comment: Performed at [facility], [address], [place], [place] [address]  Urinalysis, Routine w reflex microscopic -Urine, Clean Catch     Status: Abnormal   Collection Time: 10/11/23  6:45 PM  Result Value Ref Range   Color, Urine YELLOW YELLOW   APPearance CLEAR CLEAR   Specific  Gravity, Urine 1.014 1.005 - 1.030   pH 5.0 5.0 - 8.0   Glucose, UA >=500 (A) NEGATIVE mg/dL   Hgb urine dipstick NEGATIVE NEGATIVE   Bilirubin Urine NEGATIVE NEGATIVE   Ketones, ur NEGATIVE NEGATIVE mg/dL   Protein, ur NEGATIVE NEGATIVE mg/dL   Nitrite NEGATIVE NEGATIVE   Leukocytes,Ua NEGATIVE NEGATIVE   RBC /  HPF 0-5 0 - 5 RBC/hpf   WBC, UA 0-5 0 - 5 WBC/hpf   Bacteria, UA NONE SEEN NONE SEEN   Squamous Epithelial / HPF 0-5 0 - 5 /HPF   Mucus PRESENT    Hyaline Casts, UA PRESENT     Comment: Performed at [facility], [address], [place], [place] [address]  I-Stat venous blood gas, [facility], [facility], [facility])     Status: Abnormal   Collection Time: 10/11/23 11:55 PM  Result Value Ref Range   pH, Ven 7.393 7.25 - 7.43   pCO2, Ven 48.9 44 - 60 mmHg   pO2, Ven 47 (H) 32 - 45 mmHg   Bicarbonate 29.8 (H) 20.0 - 28.0 mmol/L   TCO2 31 22 - 32 mmol/L   O2 Saturation 82 %   Acid-Base Excess 4.0 (H) 0.0 - 2.0 mmol/L   Sodium 129 (L) 135 - 145 mmol/L   Potassium 4.5 3.5 - 5.1 mmol/L   Calcium , Ion 1.05 (L) 1.15 - 1.40 mmol/L   HCT 46.0 39.0 - 52.0 %   Hemoglobin 15.6 13.0 - 17.0 g/dL   Sample type VENOUS   I-stat chem 8, ED (not at [facility], [facility] or [facility])     Status: Abnormal   Collection Time: 10/12/23 12:17 AM  Result Value Ref Range   Sodium 129 (L) 135 - 145 mmol/L   Potassium 4.5 3.5 - 5.1 mmol/L   Chloride 90 (L) 98 - 111 mmol/L   BUN 39 (H) 6 - 20 mg/dL   Creatinine, Ser 6.04 (H) 0.61 - 1.24 mg/dL   Glucose, Bld 540 (HH) 70 - 99 mg/dL    Comment: Glucose reference range applies only to samples taken after fasting for at least 8 hours.   Calcium , Ion 1.08 (L) 1.15 - 1.40 mmol/L   TCO2 28 22 - 32 mmol/L   Hemoglobin 16.0 13.0 - 17.0 g/dL   HCT 98.1 19.1 - 47.8 %   Comment NOTIFIED PHYSICIAN   CBG monitoring, ED     Status: Abnormal   Collection Time: 10/12/23  1:56 AM  Result Value Ref Range   Glucose-Capillary >600 (HH) 70 - 99 mg/dL    Comment: Glucose reference range applies only to samples taken after fasting for at least 8 hours.  Basic metabolic panel     Status: Abnormal   Collection Time: 10/12/23  7:49 AM  Result Value Ref Range   Sodium 131 (L) 135 - 145 mmol/L   Potassium 4.2 3.5 - 5.1 mmol/L   Chloride 92 (L) 98 - 111 mmol/L   CO2 25 22 - 32 mmol/L   Glucose, Bld 476 (H)  70 - 99 mg/dL    Comment: Glucose reference range applies only to samples taken after fasting for at least 8 hours.   BUN 34 (H) 6 - 20 mg/dL   Creatinine, Ser 2.95 (H) 0.61 - 1.24 mg/dL   Calcium  8.7 (L) 8.9 - 10.3 mg/dL   GFR, Estimated 43 (L) >60 mL/min    Comment: (NOTE) Calculated using the CKD-EPI Creatinine Equation (2021)    Anion gap 14 5 - 15  Comment: Performed at [facility], [address], [place], [place] [address]  CBC with Differential/Platelet     Status: Abnormal   Collection Time: 10/12/23  7:49 AM  Result Value Ref Range   WBC 13.2 (H) 4.0 - 10.5 K/uL   RBC 4.83 4.22 - 5.81 MIL/uL   Hemoglobin 13.7 13.0 - 17.0 g/dL   HCT 62.1 30.8 - 65.7 %   MCV 87.2 80.0 - 100.0 fL   MCH 28.4 26.0 - 34.0 pg   MCHC 32.5 30.0 - 36.0 g/dL   RDW 84.6 96.2 - 95.2 %   Platelets 241 150 - 400 K/uL   nRBC 0.0 0.0 - 0.2 %   Neutrophils Relative % 65 %   Neutro Abs 8.5 (H) 1.7 - 7.7 K/uL   Lymphocytes Relative 27 %   Lymphs Abs 3.5 0.7 - 4.0 K/uL   Monocytes Relative 8 %   Monocytes Absolute 1.1 (H) 0.1 - 1.0 K/uL   Eosinophils Relative 0 %   Eosinophils Absolute 0.0 0.0 - 0.5 K/uL   Basophils Relative 0 %   Basophils Absolute 0.0 0.0 - 0.1 K/uL   Immature Granulocytes 0 %   Abs Immature Granulocytes 0.05 0.00 - 0.07 K/uL    Comment: Performed at [facility], [address], [place], [place] [address]  CBG monitoring, ED     Status: Abnormal   Collection Time: 10/12/23  8:28 AM  Result Value Ref Range   Glucose-Capillary 449 (H) 70 - 99 mg/dL    Comment: Glucose reference range applies only to samples taken after fasting for at least 8 hours.  Glucose, capillary     Status: Abnormal   Collection Time: 10/12/23 12:19 PM  Result Value Ref Range   Glucose-Capillary 373 (H) 70 - 99 mg/dL    Comment: Glucose reference range applies only to samples taken after fasting for at least 8 hours.  Glucose, capillary     Status: Abnormal   Collection Time: 10/12/23  4:21  PM  Result Value Ref Range   Glucose-Capillary 426 (H) 70 - 99 mg/dL    Comment: Glucose reference range applies only to samples taken after fasting for at least 8 hours.  Glucose, capillary     Status: Abnormal   Collection Time: 10/12/23  6:58 PM  Result Value Ref Range   Glucose-Capillary 435 (H) 70 - 99 mg/dL    Comment: Glucose reference range applies only to samples taken after fasting for at least 8 hours.  Glucose, capillary     Status: Abnormal   Collection Time: 10/12/23  8:28 PM  Result Value Ref Range   Glucose-Capillary 283 (H) 70 - 99 mg/dL    Comment: Glucose reference range applies only to samples taken after fasting for at least 8 hours.  Glucose, capillary     Status: Abnormal   Collection Time: 10/13/23  7:55 AM  Result Value Ref Range   Glucose-Capillary 164 (H) 70 - 99 mg/dL    Comment: Glucose reference range applies only to samples taken after fasting for at least 8 hours.  CBC     Status: Abnormal   Collection Time: 10/13/23  9:03 AM  Result Value Ref Range   WBC 10.6 (H) 4.0 - 10.5 K/uL   RBC 4.28 4.22 - 5.81 MIL/uL   Hemoglobin 12.3 (L) 13.0 - 17.0 g/dL   HCT 44.0 (L) 10.2 - 72.5 %   MCV 85.7 80.0 - 100.0 fL   MCH 28.7 26.0 -  34.0 pg   MCHC 33.5 30.0 - 36.0 g/dL   RDW 40.9 81.1 - 91.4 %   Platelets 199 150 - 400 K/uL   nRBC 0.0 0.0 - 0.2 %    Comment: Performed at [facility], [address], [place], [place] [address]  Basic metabolic panel     Status: Abnormal   Collection Time: 10/13/23  9:03 AM  Result Value Ref Range   Sodium 136 135 - 145 mmol/L   Potassium 3.9 3.5 - 5.1 mmol/L   Chloride 103 98 - 111 mmol/L   CO2 25 22 - 32 mmol/L   Glucose, Bld 234 (H) 70 - 99 mg/dL    Comment: Glucose reference range applies only to samples taken after fasting for at least 8 hours.   BUN 22 (H) 6 - 20 mg/dL   Creatinine, Ser 6.21 (H) 0.61 - 1.24 mg/dL   Calcium  8.8 (L) 8.9 - 10.3 mg/dL   GFR, Estimated >30 >86 mL/min    Comment:  (NOTE) Calculated using the CKD-EPI Creatinine Equation (2021)    Anion gap 8 5 - 15    Comment: Performed at [facility], [address], [place], [place] [address]  Glucose, capillary     Status: Abnormal   Collection Time: 10/13/23 11:59 AM  Result Value Ref Range   Glucose-Capillary 268 (H) 70 - 99 mg/dL    Comment: Glucose reference range applies only to samples taken after fasting for at least 8 hours.   Comment 1 Document in Chart   Glucose, capillary     Status: Abnormal   Collection Time: 10/13/23  4:55 PM  Result Value Ref Range   Glucose-Capillary 223 (H) 70 - 99 mg/dL    Comment: Glucose reference range applies only to samples taken after fasting for at least 8 hours.  Glucose, capillary     Status: Abnormal   Collection Time: 10/13/23  8:00 PM  Result Value Ref Range   Glucose-Capillary 282 (H) 70 - 99 mg/dL    Comment: Glucose reference range applies only to samples taken after fasting for at least 8 hours.  Basic metabolic panel with GFR     Status: Abnormal   Collection Time: 10/14/23  3:09 AM  Result Value Ref Range   Sodium 138 135 - 145 mmol/L   Potassium 3.8 3.5 - 5.1 mmol/L   Chloride 106 98 - 111 mmol/L   CO2 27 22 - 32 mmol/L   Glucose, Bld 216 (H) 70 - 99 mg/dL    Comment: Glucose reference range applies only to samples taken after fasting for at least 8 hours.   BUN 17 6 - 20 mg/dL   Creatinine, Ser 9.62 0.61 - 1.24 mg/dL   Calcium  9.2 8.9 - 10.3 mg/dL   GFR, Estimated >95 >28 mL/min    Comment: (NOTE) Calculated using the CKD-EPI Creatinine Equation (2021)    Anion gap 5 5 - 15    Comment: Performed at [facility], [address], [place], [place] [address]  Glucose, capillary     Status: Abnormal   Collection Time: 10/14/23  8:35 AM  Result Value Ref Range   Glucose-Capillary 209 (H) 70 - 99 mg/dL    Comment: Glucose reference range applies only to samples taken after fasting for at least 8 hours.  Glucose, capillary     Status:  Abnormal   Collection Time: 10/14/23 11:49 AM  Result Value Ref Range   Glucose-Capillary 183 (H) 70 -  99 mg/dL    Comment: Glucose reference range applies only to samples taken after fasting for at least 8 hours.  T-helper cells (CD4) count (not at [facility])     Status: None   Collection Time: 11/08/23 10:43 AM  Result Value Ref Range   CD4 T Helper % 33 30 - 61 %   Absolute CD4 1,116 490 - 1,740 cells/uL   Total lymphocyte count 3,403 850 - 3,900 cells/uL    Comment: The analytical performance characteristics of this assay  have been determined by [employer]. The  modifications have not been cleared or approved by the  FDA. This assay has been validated pursuant to the CLIA  regulations and is used for clinical purposes.   Hepatitis B surface antibody,qualitative     Status: None   Collection Time: 11/08/23 10:43 AM  Result Value Ref Range   Hep B S Ab NON-REACTIVE NON-REACTIVE  Hepatitis B DNA, ultraquantitative, PCR     Status: None   Collection Time: 11/08/23 10:43 AM  Result Value Ref Range   Hepatitis B DNA Not Detected IU/mL   Hepatitis B virus DNA Not Detected Log IU/mL    Comment: . Reference Range:                          Not Detected       IU/mL                          Not Detected   Log IU/mL . The analytical performance characteristics of this assay have been determined by [employer]. The modifications have not been cleared or approved by the U.S. Food and Drug Administration. This assay has been validated pursuant to the CLIA regulations and is used for clinical purposes. .   Hepatitis B surface antigen     Status: None   Collection Time: 11/08/23 10:43 AM  Result Value Ref Range   Hepatitis B Surface Ag NON-REACTIVE NON-REACTIVE    Comment: . For additional information, please refer to  http://education.[facility].com/faq/FAQ202  (This link is being provided for informational/ educational purposes only.) .   HIV-1 RNA quant-no  reflex-bld     Status: Abnormal   Collection Time: 11/08/23 10:43 AM  Result Value Ref Range   HIV 1 RNA Quant 53 (H) NOT DETECTED copies/mL   HIV-1 RNA Quant, Log 1.72 (H) NOT DETECTED Log copies/mL    Comment: . This test was performed using Real-Time Polymerase Chain Reaction. . Reportable Range: 20 copies/mL to 10,000,000 copies/mL (1.30 log copies/mL to 7.00 log copies/mL).         Carnell Christian, MD, MS

## 2023-11-26 ENCOUNTER — Other Ambulatory Visit (HOSPITAL_COMMUNITY): Payer: Self-pay

## 2023-11-26 ENCOUNTER — Other Ambulatory Visit: Payer: Self-pay | Admitting: Family Medicine

## 2023-11-26 ENCOUNTER — Other Ambulatory Visit: Payer: Self-pay

## 2023-11-26 DIAGNOSIS — I10 Essential (primary) hypertension: Secondary | ICD-10-CM

## 2023-11-27 ENCOUNTER — Other Ambulatory Visit: Payer: Self-pay

## 2023-11-29 ENCOUNTER — Other Ambulatory Visit (INDEPENDENT_AMBULATORY_CARE_PROVIDER_SITE_OTHER): Payer: MEDICAID

## 2023-11-29 DIAGNOSIS — I10 Essential (primary) hypertension: Secondary | ICD-10-CM

## 2023-11-29 LAB — BASIC METABOLIC PANEL WITH GFR
BUN: 16 mg/dL (ref 6–23)
CO2: 25 meq/L (ref 19–32)
Calcium: 9.3 mg/dL (ref 8.4–10.5)
Chloride: 104 meq/L (ref 96–112)
Creatinine, Ser: 1.22 mg/dL (ref 0.40–1.50)
GFR: 70.65 mL/min (ref >60.00)
Glucose, Bld: 111 mg/dL — ABNORMAL HIGH (ref 70–99)
Potassium: 3.7 meq/L (ref 3.5–5.1)
Sodium: 139 meq/L (ref 135–145)

## 2023-11-30 ENCOUNTER — Other Ambulatory Visit (HOSPITAL_COMMUNITY): Payer: Self-pay

## 2023-11-30 ENCOUNTER — Other Ambulatory Visit: Payer: Self-pay | Admitting: Family Medicine

## 2023-11-30 DIAGNOSIS — I10 Essential (primary) hypertension: Secondary | ICD-10-CM

## 2023-12-01 NOTE — Progress Notes (Unsigned)
 Psychiatric Follow Up Adult Assessment  Patient Identification: Phillip Gill MRN:  [id number] Date of Evaluation:  12/01/2023 Referral Source: Kasandra Pain, MD  Assessment:  Phillip Gill is a 48 y.o. male with a history of MDD, GAD, panic attacks, HIV on HAART, OSA on CPAP, chronic hepatitis B, anal condyloma, GERD, hypertension who presents in person to [facility] for medication management. He was iniitally seen by me on 05/04/23 and diagnosed with MDD and GAD with panic attacks.  Patient reports depression has mildly improved with restarting bupropion .  He does feel that his depression is still persistent and follow-up his symptoms are likely secondary to third shift work and self-isolation. Given ongoing hypertension is not yet controlled, we will hold off on increasing bupropion . If there is continued difficulty with this, we will likely need to switch to a different psychotropic or change patient's lexapro . Patient to follow up with me in 2 months.   Plan:  # Major Depressive Disorder, recurrent episode Past medication trials:  Status of problem: active Interventions: -- Continue psychotherapy  -- Continue lexapro  20 milligrams daily -- Continue abilify  5 milligrams daily  -10/2023 A1c 10.3, 08/01/23 LDL cholesterol 130  - ECG 01/2022 Qtc 413 --Continue bupropion  XL 150 mg daily  -No history of seizures  # Generalized anxiety disorder with panic attack Past medication trials:  Status of problem: Active Interventions: -- Lexapro  as above -- Abilify  as above --hold propranolol   Vitamin D  Deficiency -vitamin d  50,000 units weekly for 8 weeks -repeat vitamin D  level at follow up  Return to care in 2 month  Patient was given contact information for behavioral health clinic and was instructed to call 911 for emergencies.    Patient and plan of care will be discussed with the Attending MD, Dr. Eligio Grumbling, who agrees with the above statement and plan.    Subjective:  Chief Complaint: Medication Management  Interval history Patient presents for follow-up.  Denies SI/HI/AVH.  Reports some improvement in mood. Continues to struggle with problems with energy, poor concentration, anhedonia, self-isolation. He does admit some of his depressive symptoms are likely due to working third shift. He is compliant with current medication regiment. He denies any illicit substance use, smoking, nor alcohol. He does admit to sporadic headaches but no blurry vision or peripheral edema.  Past Psychiatric History:  Diagnoses: depression and anxiety Medication trials: Lexapro , elavil  (worsening pain), xanax , bupropion , celexa , valium , duloxetine  (GI distress), hydroxyzine , propranolol , risperidone, ambien , gabapentin  (hives) Previous psychiatrist/therapist: [facility], Dr. Rolm Clos (decade ago), therapist in the past  Hospitalizations: denies Suicide attempts: denies SIB: denies Hx of violence towards others: denies Current access to guns: denies Hx of trauma/abuse: denies  Substance Abuse History in the last 12 months:  No.  Past Medical History:  Past Medical History:  Diagnosis Date   Anxiety    Arthritis    "neck" (02/16/2015)   Chronic hepatitis B (HCC)    SECONDARY TO HIV   Chronic lower back pain    Depression    Fibromyalgia    Genital warts    HIV disease (HCC) 02/28/2015   HIV infection (HCC)    followed by Dr. Seymour Dapper- sees him every 4 months   Hypertension    IBS (irritable bowel syndrome)    Migraine    "none in years" (02/16/2015   Overweight 07/20/2015   Renal insufficiency 12/29/2013    Past Surgical History:  Procedure Laterality Date   CO2 LASER APPLICATION N/A 02/11/2013  Procedure: CO2 LASER APPLICATION;  Surgeon: Joyce Nixon, MD;  Location: [facility];  Service: General;  Laterality: N/A;   FOOT SURGERY     HIGH RESOLUTION ANOSCOPY N/A 02/11/2013   Procedure: HIGH RESOLUTION ANOSCOPY  WITH BIOPSY, LASER ABLATION;  Surgeon: Joyce Nixon, MD;  Location: [facility];  Service: General;  Laterality: N/A;   WISDOM TOOTH EXTRACTION      Family Psychiatric History:  denies  Family History:  Family History  Problem Relation Age of Onset   Hypertension Mother    Diabetes Mother    Stroke Mother        cerbral aneurysm   Pancreatic cancer Mother    Colon cancer Father    Hypertension Brother    Sleep apnea Cousin    Mental illness Neg Hx    Stomach cancer Neg Hx    Esophageal cancer Neg Hx     Social History:   Academic/Vocational: Unemployed Social History   Socioeconomic History   Marital status: Single    Spouse name: Not on file   Number of children: Not on file   Years of education: Not on file   Highest education level: Not on file  Occupational History   Not on file  Tobacco Use   Smoking status: Former    Current packs/day: 0.00    Average packs/day: 1 pack/day for 18.0 years (18.0 ttl pk-yrs)    Types: Cigarettes    Start date: 07/03/2003    Quit date: 07/02/2021    Years since quitting: 2.4    Passive exposure: Never   Smokeless tobacco: Never   Tobacco comments:    Pt vapes everyday  Vaping Use   Vaping status: Former   Quit date: 12/01/2022   Substances: Nicotine   Substance and Sexual Activity   Alcohol use: Yes    Comment: occasional use   Drug use: No   Sexual activity: Not on file    Comment: accepted condoms  Other Topics Concern   Not on file  Social History Narrative   Grew up in [place], now living in [place],-  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    Finished HS.   Doing online classes with [employer]- studying accounting   Previously 2 years of classes at [employer].    Has Partner- [name]- together for 1 year.    Has 2 girls- (born in 2000).          Social Drivers of Corporate investment banker Strain: High Risk (02/05/2023)   Overall Financial Resource Strain (CARDIA)    Difficulty  of Paying Living Expenses: Very hard  Food Insecurity: Food Insecurity Present (11/20/2023)   Hunger Vital Sign    Worried About Running Out of Food in the Last Year: Sometimes true    Ran Out of Food in the Last Year: Sometimes true  Transportation Needs: No Transportation Needs (10/12/2023)   PRAPARE - Administrator, Civil Service (Medical): No    Lack of Transportation (Non-Medical): No  Physical Activity: Sufficiently Active (07/16/2018)   [facility]   Exercise Vital Sign    Days of Exercise per Week: 4 days    Minutes of Exercise per Session: 150+ min  Stress: Stress Concern Present (03/01/2023)   [employer] of Occupational Health - Occupational Stress Questionnaire    Feeling of Stress : To some extent  Social Connections: Moderately Isolated (10/12/2023)   Social  Connection and Isolation Panel [NHANES]    Frequency of Communication with Friends and Family: Never    Frequency of Social Gatherings with Friends and Family: More than three times a week    Attends Religious Services: 1 to 4 times per year    Active Member of [employer] or Organizations: No    Attends Banker Meetings: Never    Marital Status: Never married    Additional Social History: updated  Allergies:   Allergies  Allergen Reactions   Bactrim [Sulfamethoxazole W/Trimethoprim (Co-Trimoxazole)] Hives and Shortness Of Breath   Sulfa Antibiotics Hives and Shortness Of Breath   Cymbalta  [Duloxetine  Hcl] Diarrhea   Duloxetine  Diarrhea   Metformin  And Related Nausea And Vomiting   Other Hives and Swelling    [employer] toothpaste    Pork-Derived Products Other (See Comments)    Pt refused to eat pork products.   Zoloft [Sertraline Hcl] Diarrhea   Clindamycin /Lincomycin Rash   Neurontin  [Gabapentin ] Rash    Current Medications: Current Outpatient Medications  Medication Sig Dispense Refill   ARIPiprazole  (ABILIFY ) 5 MG  tablet Take 1 tablet (5 mg total) by mouth daily. 90 tablet 0   bictegravir-emtricitabine -tenofovir  AF (BIKTARVY ) 50-200-25 MG TABS tablet TAKE 1 TABLET BY MOUTH EVERY DAY 30 tablet 5   Blood Glucose Monitoring Suppl (BLOOD GLUCOSE MONITOR SYSTEM) w/Device KIT Use to check blood sugar in the morning, at noon, and at bedtime. 1 kit 0   buPROPion  (WELLBUTRIN  XL) 150 MG 24 hr tablet Take 1 tablet (150 mg total) by mouth daily. 90 tablet 0   Continuous Glucose Sensor (FREESTYLE LIBRE 3 SENSOR) MISC Place 1 sensor on the skin every 14 days. Use to check glucose continuously 2 each 4   diclofenac  Sodium (VOLTAREN ) 1 % GEL Apply 4 grams topically 4 (four) times daily to affected joint. 100 g 11   Elastic Bandages & Supports (CARPAL TUNNEL WRIST STABILIZER) MISC 1 each by Does not apply route as needed. 1 each 0   Elastic Bandages & Supports (MEDICAL COMPRESSION STOCKINGS) MISC 1 each by Does not apply route daily. 1 each 0   escitalopram  (LEXAPRO ) 20 MG tablet Take 1 tablet (20 mg total) by mouth daily. 90 tablet 0   esomeprazole  (NEXIUM ) 40 MG capsule Take 1 capsule (40 mg total) by mouth 2 (two) times daily before a meal. (Patient taking differently: Take 40 mg by mouth daily as needed (for acid reflux).) 180 capsule 3   finasteride  (PROSCAR ) 5 MG tablet Take 1 tablet (5 mg total) by mouth daily. 30 tablet 1   indapamide  (LOZOL ) 1.25 MG tablet Take 1 tablet (1.25 mg total) by mouth daily. 90 tablet 0   Insulin  Pen Needle 32G X 4 MM MISC Use as directed with insulin  100 each 0   olmesartan  (BENICAR ) 20 MG tablet Take 1 tablet (20 mg total) by mouth daily. 30 tablet 0   rosuvastatin  (CRESTOR ) 10 MG tablet Take 1 tablet (10 mg total) by mouth daily. 30 tablet 2   Semaglutide , 1 MG/DOSE, 4 MG/3ML SOPN Inject 1 mg as directed once a week. 3 mL 0   sildenafil  (VIAGRA ) 100 MG tablet Take 0.5-1 tablets (50-100 mg total) by mouth daily as needed for erectile dysfunction. Do not exceed 100 mg in a day 30 tablet 1    tadalafil  (CIALIS ) 10 MG tablet Take 0.5 tablets (5 mg total) by mouth daily. 45 tablet 0   Vitamin D , Ergocalciferol , (DRISDOL ) 1.25 MG (50000 UNIT) CAPS capsule Take 1 capsule (  50,000 Units total) by mouth every 7 (seven) days. 8 capsule 0   No current facility-administered medications for this visit.    ROS: Review of Systems   Objective:  Psychiatric Specialty Exam: There were no vitals taken for this visit.There is no height or weight on file to calculate BMI.  General Appearance: Casual  Eye Contact:  Fair  Speech:  Clear and Coherent and Normal Rate  Volume:  Decreased  Mood:  Anxious  Affect:  Appropriate and Congruent  Thought Content: Logical   Suicidal Thoughts:  No  Homicidal Thoughts:  No  Thought Process:  Coherent, Goal Directed, and Linear  Orientation:  Full (Time, Place, and Person)    Memory: Recent;   Fair  Judgment:  Intact  Insight:  Fair  Concentration:  Attention Span: Fair  Recall:  not formally assessed   Fund of Knowledge: Fair  Language: Fair  Psychomotor Activity:  Normal  Akathisia:  No  AIMS (if indicated): not done  Assets:  Manufacturing systems engineer Desire for Improvement Financial Resources/Insurance Housing Social Support Transportation  ADL's:  Intact  Cognition: WNL  Sleep:  Good   PE: General: well-appearing; no acute distress  Pulm: no increased work of breathing on room air  Strength & Muscle Tone: within normal limits Neuro: no focal neurological deficits observed  Gait & Station: normal  Metabolic Disorder Labs: Lab Results  Component Value Date   HGBA1C 10.3 (A) 10/08/2023   MPG 111 10/02/2014   No results found for: "PROLACTIN" Lab Results  Component Value Date   CHOL 199 08/01/2023   TRIG 158 (H) 08/01/2023   HDL 42 08/01/2023   CHOLHDL 4.7 08/01/2023   VLDL 16 02/14/2015   LDLCALC 130 (H) 08/01/2023   LDLCALC 91 10/23/2022   Lab Results  Component Value Date   TSH 1.39 09/19/2023    Therapeutic Level  Labs: No results found for: "LITHIUM" No results found for: "CBMZ" No results found for: "VALPROATE"  Screenings:  GAD-7    Flowsheet Row Office Visit from 11/22/2023 in [facility] Visit from 11/21/2023 in [facility] - [facility] Office Visit from 10/03/2023 in [facility] Counselor from 09/24/2023 in [facility] at [facility] from 09/09/2023 in [facility] at [facility]  Total GAD-7 Score 19 19 18 14 17       PHQ2-9    Flowsheet Row Office Visit from 11/22/2023 in [facility] at [facility] Visit from 11/21/2023 in [facility] - A Dept Of . [facility] Nutrition from 11/20/2023 in [facility]  - A Dept Of [facility] Office Visit from 10/03/2023 in [facility] at [facility] Counselor from 09/24/2023 in [facility] at [facility] Total Score 6 6 2 6 6   PHQ-9 Total Score 27 27 -- 26 25      Flowsheet Row ED to Hosp-Admission (Discharged) from 10/11/2023 in [facility] ED from 10/07/2023 in [facility] Department at [facility] ED from 09/16/2023 in [facility] at [facility]  C-SSRS RISK CATEGORY No Risk No Risk No Risk       Collaboration of Care: Collaboration of Care:   Patient/Guardian was advised Release of Information must  be obtained prior to any record release in order to collaborate their care with an outside provider. Patient/Guardian was advised if they have not already done so to contact the registration department to sign all necessary forms in order for us  to release information regarding their care.   Consent: Patient/Guardian gives verbal  consent for treatment and assignment of benefits for services provided during this visit. Patient/Guardian expressed understanding and agreed to proceed.   Augusta Blizzard, MD 6/1/20251:59 PM

## 2023-12-02 ENCOUNTER — Ambulatory Visit: Payer: Self-pay | Admitting: Family Medicine

## 2023-12-02 ENCOUNTER — Other Ambulatory Visit: Payer: Self-pay

## 2023-12-02 ENCOUNTER — Ambulatory Visit (HOSPITAL_BASED_OUTPATIENT_CLINIC_OR_DEPARTMENT_OTHER): Payer: MEDICAID | Admitting: Student

## 2023-12-02 ENCOUNTER — Other Ambulatory Visit (HOSPITAL_COMMUNITY): Payer: Self-pay

## 2023-12-02 DIAGNOSIS — F330 Major depressive disorder, recurrent, mild: Secondary | ICD-10-CM

## 2023-12-02 DIAGNOSIS — F411 Generalized anxiety disorder: Secondary | ICD-10-CM | POA: Diagnosis not present

## 2023-12-02 MED ORDER — BUPROPION HCL ER (SR) 100 MG PO TB12
100.0000 mg | ORAL_TABLET | Freq: Every day | ORAL | 0 refills | Status: DC
  Filled 2023-12-02: qty 90, 90d supply, fill #0

## 2023-12-02 MED ORDER — BUPROPION HCL ER (XL) 150 MG PO TB24
150.0000 mg | ORAL_TABLET | Freq: Every day | ORAL | 0 refills | Status: DC
  Filled 2023-12-02 – 2023-12-03 (×2): qty 90, 90d supply, fill #0

## 2023-12-02 MED ORDER — ESCITALOPRAM OXALATE 20 MG PO TABS
20.0000 mg | ORAL_TABLET | Freq: Every day | ORAL | 0 refills | Status: DC
  Filled 2023-12-02 – 2023-12-03 (×2): qty 90, 90d supply, fill #0

## 2023-12-02 MED ORDER — ARIPIPRAZOLE 5 MG PO TABS
5.0000 mg | ORAL_TABLET | Freq: Every day | ORAL | 0 refills | Status: DC
  Filled 2023-12-02 – 2023-12-03 (×2): qty 90, 90d supply, fill #0

## 2023-12-03 ENCOUNTER — Other Ambulatory Visit: Payer: Self-pay | Admitting: Family Medicine

## 2023-12-03 ENCOUNTER — Encounter (HOSPITAL_COMMUNITY): Payer: Self-pay | Admitting: Student

## 2023-12-03 ENCOUNTER — Telehealth: Payer: Self-pay

## 2023-12-03 ENCOUNTER — Other Ambulatory Visit (HOSPITAL_COMMUNITY): Payer: Self-pay

## 2023-12-03 ENCOUNTER — Other Ambulatory Visit (HOSPITAL_BASED_OUTPATIENT_CLINIC_OR_DEPARTMENT_OTHER): Payer: Self-pay

## 2023-12-03 ENCOUNTER — Other Ambulatory Visit: Payer: Self-pay

## 2023-12-03 ENCOUNTER — Encounter: Payer: Self-pay | Admitting: Family Medicine

## 2023-12-03 DIAGNOSIS — I10 Essential (primary) hypertension: Secondary | ICD-10-CM

## 2023-12-03 MED ORDER — INDAPAMIDE 1.25 MG PO TABS
1.2500 mg | ORAL_TABLET | Freq: Every day | ORAL | 0 refills | Status: DC
  Filled 2023-12-03 – 2023-12-17 (×3): qty 90, 90d supply, fill #0

## 2023-12-03 NOTE — Telephone Encounter (Addendum)
 Our Team has received a request for Prior Authorization VIA PT MSGS for Ozempic  (1 MG/DOSE) 4MG /3ML pen-injectors. However I have ran a test claim and NO further authorization is needed and I have contacted the pharmact and the Rx has been filled/SHIPPED OUT TODAY as of today 6.3.25  Authorization is on file until 4.15.26. nothing further needed.

## 2023-12-05 ENCOUNTER — Encounter (HOSPITAL_COMMUNITY): Payer: Self-pay

## 2023-12-05 ENCOUNTER — Ambulatory Visit (INDEPENDENT_AMBULATORY_CARE_PROVIDER_SITE_OTHER): Payer: MEDICAID | Admitting: Licensed Clinical Social Worker

## 2023-12-05 DIAGNOSIS — F330 Major depressive disorder, recurrent, mild: Secondary | ICD-10-CM | POA: Diagnosis not present

## 2023-12-05 DIAGNOSIS — F411 Generalized anxiety disorder: Secondary | ICD-10-CM | POA: Diagnosis not present

## 2023-12-05 NOTE — Progress Notes (Signed)
 THERAPIST PROGRESS NOTE   Session Date: 12/05/2023  Session Time: 0912 - 1005  Participation Level: Active  Behavioral Response: CasualAlertEuthymic  Type of Therapy: Individual Therapy  Treatment Goals addressed:   Progressing (5) LTG: Reduce frequency, intensity, and duration of depression symptoms so that daily functioning is improved (OP Depression) LTG: Increase coping skills to manage depression and improve ability to perform daily activities (OP Depression) LTG: "To not be depressed, have more energy, and stop over eating" (OP Depression) STG: Report a decrease in anxiety symptoms as evidenced by an overall reduction in anxiety score by a minimum of 25% on the Generalized Anxiety Disorder Scale (GAD-7) (Anxiety) STG: Abubakar "Shan" will reduce frequency of avoidant behaviors by 50% as evidenced by self-report in therapy sessions (Anxiety)  Not Progressing (1) LTG: "Be a more people person" Increase frequency at which pt is actively engaging in social activities. (Anxiety)   Progress Towards Goals: Progressing  Interventions: CBT, Solution Focused, and Supportive  Summary: [name] is a 48 y.o. male with past psych history of MDD and GAD, presenting for follow up therapy session in efforts to improve management of depressive and anxious symptoms.  Patient actively engaged in session, presenting in overall pleasant moods, and congruent affect.  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 Further detailed additional flexibility surrounding working additional shifts, being able to pick up partial shifts versus prior  job [employer] pt work a full 12 hr shift having made feel increasingly exhausted.  Actively engaged in reassessing presenting depressive and anxious symptoms over the past 2 weeks via PHQ-9 and GAD-7, reflecting on minimal progressions in relation to experience symptoms.  Further engaged in 6 months review of treatment plan, reflecting on standardized and individualized treatment goals outlined in plan, noting progress and/or lack thereof, and continued areas for work for future course of treatment.  Patient responded well to interventions. Patient continues to meet criteria for MDD and GAD. Patient will continue to benefit from engagement in outpatient therapy due to being the least restrictive service to meet presenting needs.      12/05/2023    9:18 AM 11/22/2023   10:38 AM 11/21/2023    9:01 AM 10/03/2023    9:09 AM  GAD 7 : Generalized Anxiety Score  Nervous, Anxious, on Edge 3 3 3 1   Control/stop worrying 3 3 3 3   Worry too much - different things 3 3 3 3   Trouble relaxing 0 2 2 3   Restless 1 2 2 2   Easily annoyed or irritable 3 3 3 3   Afraid - awful might happen 3 3 3 3   Total GAD 7 Score 16 19 19 18   Anxiety Difficulty Extremely difficult Extremely difficult Extremely difficult Extremely difficult      12/05/2023    9:19 AM 11/22/2023   10:36 AM 11/21/2023    9:00 AM 11/20/2023    9:22 AM 10/03/2023    9:09 AM  Depression screen PHQ 2/9  Decreased Interest 3 3 3 1 3   Down, Depressed, Hopeless 3 3 3 1 3   PHQ - 2 Score 6 6 6 2 6   Altered sleeping 1  3 3  2   Tired, decreased energy 1 3 3  3   Change in appetite 3 3 3  3   Feeling bad or failure about yourself  3 3 3  3   Trouble concentrating 3 3 3  3   Moving slowly or fidgety/restless 1 3 3  3   Suicidal thoughts 1 3 3  3   PHQ-9 Score 19 27 27  26   Difficult doing work/chores Extremely dIfficult Extremely dIfficult Extremely dIfficult  Extremely dIfficult    Suicidal/Homicidal: Yes. pSI, "Sometimes just want to give up, but the fight  in me won't allow me to and keeps on fighting".  Therapist Response: Clinician utilized CBT, solution focused, and supportive reflection interventions to support pt in navigation presenting sxs.   Clinician actively greeted upon presenting for today's visit, actively engaging in introductory check-in, assessing presenting moods and affect, further revoking patient's recounts of recent events and factors contributing to presenting moods.  Actively listened to patient's recounts of events, utilizing open-ended questions to further support patient in exploration of thoughts, feelings, and perspectives surrounding recent events and changes.  Provided patient with support and validation of expressed thoughts and feelings in relation to continued presenting stressors as well as recent progressions.  Reassess presenting depressive and anxious symptoms via PHQ-9 and GAD-7, processing variances and scores with patient and further engage patient in 6 months review of goals outlined in treatment plan, encouraging patient to consider overall progress throughout course of treatment thus far and continued areas for work.  Clinician reassessed presentation of sxs and presence of safety concerns. Clinician provided support and empathy to patient during session.  Plan: Return again in 2 weeks.  Diagnosis:  Encounter Diagnoses  Name Primary?   MDD (major depressive disorder), recurrent episode, mild (HCC) Yes   GAD (generalized anxiety disorder)     Collaboration of Care: Other None necessary at this time.  Patient/Guardian was advised Release of Information must be obtained prior to any record release in order to collaborate their care with an outside provider. Patient/Guardian was advised if they have not already done so to contact the registration department to sign all necessary forms in order for us  to release information regarding their care.   Consent: Patient/Guardian gives verbal consent for treatment  and assignment of benefits for services provided during this visit. Patient/Guardian expressed understanding and agreed to proceed.   Patsi Boots, MSW, LCSW 12/05/2023,  9:33 AM

## 2023-12-11 ENCOUNTER — Other Ambulatory Visit: Payer: MEDICAID

## 2023-12-11 NOTE — Progress Notes (Signed)
   12/11/2023  Patient ID: Phillip Gill, male   DOB: Jun 14, 1976, 49 y.o.   MRN: [id number]  Subjective/Objective Patient returned missed call for telephone follow-up visit   Diabetes  -Current medications:  Ozempic  1mg  weekly -Patient recently resumed Ozempic  at 1mg  dose; he has had two injections at this dose and endorses tolerating well -Using [facility] for CGM, and data is linked to [employer]- last 14 days reflects the following: CGM active 93% of the time Average glucose 118 GMI 6.1% High 1%, Target 97%, Low 2% -20 documented readings of BG 52-69 over the past 2 days; patient states he was sleeping during the majority of these instances, so he is not sure if he felt any s/sx of hypoglycemia or not.  Libre alerted him and he would drink some juice to bring readings back up.  Possible pressure could have been applied to sensor while sleeping causing false lows. -A1c on 4/8 reflected value of 10.3%   Hypertension -Current medications:  olmesartan  20mg , indapamide  1.25 mg daily -Indapamide  1.25mg  daily recently added to regimen for additional BP control -Patient has not monitored home BP since starting indapamide , but he did take a reading during our call and this was 150/106 -Last OV BP 150/98 -Patient does endorse s/sx of hypertension including headache   Assessment/Plan   Diabetes -Continue Ozempic  1mg  weekly -Continue CGM with Freestyle Libre -Encouraged patient to use finger prick to verify low BG readings during the night if he does not feel hypoglycemic; may need to wear sensor on side he does not sleep on -Curious if Biktarvy  may have induced accelerated hyperglycemia; not likely since patient has been on medication for a while.  Several case studies do reflect accelerated hyperglycemia with Biktarvy  use with resolve in A1c and glucose to baseline after insulin  therapy.  Recent corticosteroids and Ability may have also contributed to  this. -Due for A1c around 7/8   Hypertension -Continue current regimen  -Instructed patient to monitor and record home BP readings approximately 1 hour after taking BP medications; bring readings to upcoming visit with Dr. Hildy Lowers -If BP consistently >130/80, could consider increasing dose of indapamide  or olmesartan .  If either medication increased, I recommend follow-up BMP in 2 weeks.   Follow-up:  1 month   Linn Rich, PharmD, DPLA

## 2023-12-13 ENCOUNTER — Other Ambulatory Visit: Payer: Self-pay

## 2023-12-16 ENCOUNTER — Other Ambulatory Visit (HOSPITAL_COMMUNITY): Payer: Self-pay

## 2023-12-16 ENCOUNTER — Other Ambulatory Visit: Payer: Self-pay | Admitting: Family Medicine

## 2023-12-16 ENCOUNTER — Other Ambulatory Visit: Payer: Self-pay | Admitting: Pharmacy Technician

## 2023-12-16 ENCOUNTER — Other Ambulatory Visit: Payer: Self-pay

## 2023-12-16 DIAGNOSIS — I10 Essential (primary) hypertension: Secondary | ICD-10-CM

## 2023-12-16 DIAGNOSIS — R351 Nocturia: Secondary | ICD-10-CM

## 2023-12-16 DIAGNOSIS — E1129 Type 2 diabetes mellitus with other diabetic kidney complication: Secondary | ICD-10-CM

## 2023-12-16 DIAGNOSIS — E1165 Type 2 diabetes mellitus with hyperglycemia: Secondary | ICD-10-CM

## 2023-12-16 MED ORDER — OLMESARTAN MEDOXOMIL 20 MG PO TABS
20.0000 mg | ORAL_TABLET | Freq: Every day | ORAL | 0 refills | Status: DC
Start: 2023-12-16 — End: 2023-12-24
  Filled 2023-12-16: qty 30, 30d supply, fill #0

## 2023-12-16 MED ORDER — OZEMPIC (1 MG/DOSE) 4 MG/3ML ~~LOC~~ SOPN
1.0000 mg | PEN_INJECTOR | SUBCUTANEOUS | 0 refills | Status: DC
Start: 2023-12-16 — End: 2023-12-24
  Filled 2023-12-16 – 2023-12-17 (×2): qty 3, 28d supply, fill #0

## 2023-12-16 MED ORDER — FINASTERIDE 5 MG PO TABS
5.0000 mg | ORAL_TABLET | Freq: Every day | ORAL | 1 refills | Status: DC
  Filled 2023-12-16 – 2023-12-17 (×2): qty 30, 30d supply, fill #0

## 2023-12-16 NOTE — Progress Notes (Signed)
 Specialty Pharmacy Refill Coordination Note  Phillip Gill is a 48 y.o. male contacted today regarding refills of specialty medication(s) Bictegravir-Emtricitab-Tenofov (Biktarvy )   Patient requested Delivery   Delivery date: 12/18/23   Verified address: [address]  [place] [address]   Medication will be filled on 12/17/23.

## 2023-12-17 ENCOUNTER — Encounter: Payer: Self-pay | Admitting: Pharmacist

## 2023-12-17 ENCOUNTER — Other Ambulatory Visit: Payer: Self-pay

## 2023-12-17 ENCOUNTER — Other Ambulatory Visit (HOSPITAL_COMMUNITY): Payer: Self-pay

## 2023-12-18 ENCOUNTER — Other Ambulatory Visit (HOSPITAL_COMMUNITY): Payer: Self-pay

## 2023-12-20 ENCOUNTER — Other Ambulatory Visit (HOSPITAL_COMMUNITY): Payer: Self-pay

## 2023-12-20 ENCOUNTER — Other Ambulatory Visit: Payer: Self-pay

## 2023-12-24 ENCOUNTER — Other Ambulatory Visit: Payer: Self-pay

## 2023-12-24 ENCOUNTER — Encounter: Payer: Self-pay | Admitting: Family Medicine

## 2023-12-24 ENCOUNTER — Other Ambulatory Visit: Payer: Self-pay | Admitting: Family Medicine

## 2023-12-24 ENCOUNTER — Ambulatory Visit (INDEPENDENT_AMBULATORY_CARE_PROVIDER_SITE_OTHER): Payer: MEDICAID | Admitting: Family Medicine

## 2023-12-24 ENCOUNTER — Other Ambulatory Visit (HOSPITAL_COMMUNITY): Payer: Self-pay

## 2023-12-24 VITALS — BP 146/94 | HR 87 | Temp 97.6°F | Ht 72.0 in | Wt 237.4 lb

## 2023-12-24 DIAGNOSIS — E1165 Type 2 diabetes mellitus with hyperglycemia: Secondary | ICD-10-CM

## 2023-12-24 DIAGNOSIS — E559 Vitamin D deficiency, unspecified: Secondary | ICD-10-CM

## 2023-12-24 DIAGNOSIS — E6609 Other obesity due to excess calories: Secondary | ICD-10-CM

## 2023-12-24 DIAGNOSIS — I10 Essential (primary) hypertension: Secondary | ICD-10-CM

## 2023-12-24 DIAGNOSIS — Z6832 Body mass index (BMI) 32.0-32.9, adult: Secondary | ICD-10-CM

## 2023-12-24 DIAGNOSIS — E66811 Obesity, class 1: Secondary | ICD-10-CM

## 2023-12-24 DIAGNOSIS — Z7985 Long-term (current) use of injectable non-insulin antidiabetic drugs: Secondary | ICD-10-CM

## 2023-12-24 DIAGNOSIS — R351 Nocturia: Secondary | ICD-10-CM

## 2023-12-24 MED ORDER — FINASTERIDE 5 MG PO TABS
5.0000 mg | ORAL_TABLET | Freq: Every day | ORAL | 1 refills | Status: DC
Start: 2023-12-24 — End: 2024-03-27
  Filled 2023-12-24: qty 30, 30d supply, fill #0
  Filled 2024-01-09 – 2024-01-27 (×2): qty 30, 30d supply, fill #1

## 2023-12-24 MED ORDER — SEMAGLUTIDE (2 MG/DOSE) 8 MG/3ML ~~LOC~~ SOPN
2.0000 mg | PEN_INJECTOR | SUBCUTANEOUS | 0 refills | Status: DC
  Filled 2023-12-24 (×2): qty 9, 84d supply, fill #0
  Filled 2023-12-26: qty 3, 28d supply, fill #0

## 2023-12-24 MED ORDER — INDAPAMIDE 2.5 MG PO TABS
2.5000 mg | ORAL_TABLET | Freq: Every day | ORAL | 0 refills | Status: DC
  Filled 2023-12-24: qty 90, 90d supply, fill #0

## 2023-12-24 MED ORDER — VITAMIN D (ERGOCALCIFEROL) 1.25 MG (50000 UNIT) PO CAPS
50000.0000 [IU] | ORAL_CAPSULE | ORAL | 0 refills | Status: DC
Start: 2023-12-24 — End: 2024-01-27
  Filled 2023-12-24: qty 8, 56d supply, fill #0

## 2023-12-24 MED ORDER — OLMESARTAN MEDOXOMIL 40 MG PO TABS
40.0000 mg | ORAL_TABLET | Freq: Every day | ORAL | 0 refills | Status: DC
  Filled 2023-12-24: qty 90, 90d supply, fill #0

## 2023-12-24 NOTE — Progress Notes (Signed)
 Assessment & Plan   Assessment/Plan:    Assessment & Plan Type 2 Diabetes Mellitus He is on semaglutide  (Ozempic ) 1 mg with improved blood sugars averaging 100 mg/dL, down from 881 mg/dL. The regimen is effective in approaching the A1c goal. Consideration to increase Ozempic  to 2 mg for additional benefits such as weight loss and cardiovascular protection without significant hypoglycemia risk. This adjustment aims to counteract the effects of Biktarvy , steroids, and Abilify , which may elevate blood sugars. - Increase semaglutide  (Ozempic ) to 2 mg weekly for additional benefits including weight loss and cardiovascular protection.  Hypertension Blood pressure is elevated at 146/94 mmHg. Current medications include indapamide  1.25 mg and olmesartan  20 mg. The goal is to achieve a blood pressure of less than 130/80 mmHg. Increasing olmesartan   and indapamide  may help achieve this target, as current blood pressure is approximately 16 points above the goal. - Increase olmesartan  to 40 mg and indapamide  to 2.5 mg daily   Obesity He has lost 8 pounds and follows a low-carb diet with a dietitian. BMI is 32, with a goal to reduce it below 30. He plans to start exercising. Weight loss is expected to aid in lowering blood pressure and improving overall health. - Encourage continuation of low-carb diet. - Encourage initiation of an exercise regimen to aid in weight loss and overall health.  Vitamin D  Deficiency Managed with supplementation.  - Refill Vit D 50,000 international units weekly        Medications Discontinued During This Encounter  Medication Reason   tadalafil  (CIALIS ) 10 MG tablet    Semaglutide , 1 MG/DOSE, (OZEMPIC , 1 MG/DOSE,) 4 MG/3ML SOPN    Vitamin D , Ergocalciferol , (DRISDOL ) 1.25 MG (50000 UNIT) CAPS capsule Reorder   indapamide  (LOZOL ) 1.25 MG tablet    olmesartan  (BENICAR ) 20 MG tablet Reorder   finasteride  (PROSCAR ) 5 MG tablet     Return in about 1 month (around  01/23/2024) for BP, DM.        Subjective:   Encounter date: 12/24/2023  Phillip Gill is a 48 y.o. male who has Anxiety; Other constipation; Anxiety and depression; Fibromyalgia muscle pain; Anal condyloma; Gynecomastia; HTN (hypertension); Chronic hepatitis B (HCC); LGV (lymphogranuloma venereum); Primary syphilis; Colitis; HIV (human immunodeficiency virus infection) (HCC); Tobacco abuse; Overweight; Chronic pain associated with significant psychosocial dysfunction; Gonorrhea; Human immunodeficiency virus (HIV) disease (HCC); Foot lesion; Hepatitis B surface antigen positive; Healthcare maintenance; Erectile dysfunction; Cervical pain; Human monkeypox; Encounter for physical examination related to employment; Snoring; Urinary frequency; Left foot pain; Poor social situation; MDD (major depressive disorder); Passive suicidal ideations; OSA on CPAP; MDD (major depressive disorder), recurrent severe, without psychosis (HCC); Gastroesophageal reflux disease; Neck mass; Nocturia; Carpal tunnel syndrome on both sides; Hyponatremia; AKI (acute kidney injury) (HCC); Type 2 diabetes mellitus with hyperglycemia (HCC); 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   He  has a past medical history of Anxiety, Arthritis, Chronic hepatitis B (HCC), Chronic lower back pain, Depression, Fibromyalgia, Genital warts, HIV disease (HCC) (02/28/2015), HIV infection (HCC), Hypertension, IBS (irritable bowel syndrome), Migraine, Overweight (07/20/2015), and Renal insufficiency (12/29/2013).[name]   He presents with chief complaint of Follow-up .   Discussed the use of AI scribe software for clinical note transcription with the patient, who gave verbal consent to proceed.  History of Present Illness Phillip Gill is a 48 year old male with  diabetes  and hypertension who presents for follow-up.  He reports that his blood pressure is 'slowing down' and headaches are 'slowly starting to fade away.' He is currently on semaglutide  1 mg, which he tolerates well. His blood sugars have improved to around 100, down from an average of 118 prior to starting the medication.  He is also on Biktarvy , steroids, and Abilify , which may have impacted his blood sugar levels. For blood pressure management, he takes indapamide  1.25 mg and olmesartan  20 mg.  He has been working with a Data processing manager and following a low-carb diet, resulting in a weight loss of about 8 pounds. He is not currently engaging in regular exercise but plans to start.  No chest pain or shortness of breath. He is not taking tadalafil  anymore, only Viagra . His BMI is currently 32.     ROS  Past Surgical History:  Procedure Laterality Date   CO2 LASER APPLICATION N/A 02/11/2013   Procedure: CO2 LASER APPLICATION;  Surgeon: Bernarda Ned, MD;  Location: [facility];  Service: General;  Laterality: N/A;   FOOT SURGERY     HIGH RESOLUTION ANOSCOPY N/A 02/11/2013   Procedure: HIGH RESOLUTION ANOSCOPY WITH BIOPSY, LASER ABLATION;  Surgeon: Bernarda Ned, MD;  Location: [facility];  Service: General;  Laterality: N/A;   WISDOM TOOTH EXTRACTION      Outpatient Medications Prior to Visit  Medication Sig Dispense Refill   ARIPiprazole  (ABILIFY ) 5 MG tablet Take 1 tablet (5 mg total) by mouth daily. 90 tablet 0   bictegravir-emtricitabine -tenofovir  AF (BIKTARVY ) 50-200-25 MG TABS tablet TAKE 1 TABLET BY MOUTH EVERY DAY 30 tablet 5   Blood Glucose Monitoring Suppl (BLOOD GLUCOSE MONITOR SYSTEM) w/Device KIT Use to check blood sugar in the morning, at noon, and at bedtime. 1 kit 0   buPROPion  (WELLBUTRIN  XL) 150 MG 24 hr tablet Take 1 tablet (150 mg total) by mouth daily. 90 tablet 0   Continuous Glucose Sensor (FREESTYLE LIBRE 3 SENSOR) MISC Place 1 sensor  on the skin every 14 days. Use to check glucose continuously 2 each 4   diclofenac  Sodium (VOLTAREN ) 1 % GEL Apply 4 grams topically 4 (four) times daily to affected joint. 100 g 11   Elastic Bandages & Supports (CARPAL TUNNEL WRIST STABILIZER) MISC 1 each by Does not apply route as needed. 1 each 0   Elastic Bandages & Supports (MEDICAL COMPRESSION STOCKINGS) MISC 1 each by Does not apply route daily. 1 each 0   escitalopram  (LEXAPRO ) 20 MG tablet Take 1 tablet (20 mg total) by mouth daily. 90 tablet 0   esomeprazole  (NEXIUM ) 40 MG capsule Take 1 capsule (40 mg total) by mouth 2 (two) times daily before a meal. 180 capsule 3   Insulin  Pen Needle 32G X 4 MM MISC Use as directed with insulin  100 each 0   rosuvastatin  (CRESTOR ) 10 MG tablet Take 1 tablet (10 mg total) by mouth daily. 30 tablet 2   sildenafil  (VIAGRA ) 100 MG tablet Take 0.5-1 tablets (50-100 mg total) by mouth daily as needed for erectile dysfunction. Do not exceed 100 mg in a day 30 tablet 1   finasteride  (PROSCAR ) 5 MG tablet Take 1 tablet (5 mg total) by mouth daily. 30 tablet 1   indapamide  (LOZOL ) 1.25 MG tablet Take 1 tablet (1.25 mg total) by mouth daily. 90 tablet 0   olmesartan  (BENICAR ) 20 MG tablet Take 1 tablet (20 mg total) by mouth daily. 30 tablet 0   Semaglutide , 1 MG/DOSE, (OZEMPIC ,  1 MG/DOSE,) 4 MG/3ML SOPN Inject 1 mg as directed once a week. 3 mL 0   tadalafil  (CIALIS ) 10 MG tablet Take 0.5 tablets (5 mg total) by mouth daily. 45 tablet 0   Vitamin D , Ergocalciferol , (DRISDOL ) 1.25 MG (50000 UNIT) CAPS capsule Take 1 capsule (50,000 Units total) by mouth every 7 (seven) days. 8 capsule 0   No facility-administered medications prior to visit.    Family History  Problem Relation Age of Onset   Hypertension Mother    Diabetes Mother    Stroke Mother        cerbral aneurysm   Pancreatic cancer Mother    Colon cancer Father    Hypertension Brother    Sleep apnea Cousin    Mental illness Neg Hx    Stomach  cancer Neg Hx    Esophageal cancer Neg Hx     Social History   Socioeconomic History   Marital status: Single    Spouse name: Not on file   Number of children: Not on file   Years of education: Not on file   Highest education level: Not on file  Occupational History   Not on file  Tobacco Use   Smoking status: Former    Current packs/day: 0.00    Average packs/day: 1 pack/day for 18.0 years (18.0 ttl pk-yrs)    Types: Cigarettes    Start date: 07/03/2003    Quit date: 07/02/2021    Years since quitting: 2.4    Passive exposure: Never   Smokeless tobacco: Never   Tobacco comments:    Pt vapes everyday  Vaping Use   Vaping status: Former   Quit date: 12/01/2022   Substances: Nicotine   Substance and Sexual Activity   Alcohol use: Yes    Comment: occasional use   Drug use: No   Sexual activity: Not on file    Comment: accepted condoms  Other Topics Concern   Not on file  Social History Narrative   Grew up in [place], now living in [place],-  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    Finished HS.   Doing online classes with [employer]- studying accounting   Previously 2 years of classes at [employer].    Has Partner- [name]- together for 1 year.    Has 2 girls- (born in 2000).          Social Drivers of Corporate investment banker Strain: High Risk (02/05/2023)   Overall Financial Resource Strain (CARDIA)    Difficulty of Paying Living Expenses: Very hard  Food Insecurity: Food Insecurity Present (11/20/2023)   Hunger Vital Sign    Worried About Running Out of Food in the Last Year: Sometimes true    Ran Out of Food in the Last Year: Sometimes true  Transportation Needs: No Transportation Needs (10/12/2023)   PRAPARE - Administrator, Civil Service (Medical): No    Lack of Transportation (Non-Medical): No  Physical Activity: Sufficiently Active (07/16/2018)   Received [facility]   Exercise Vital Sign    On average,  how many days per week do you engage in moderate to strenuous exercise (like a brisk walk)?: 4 days    On average, how many minutes do you engage in exercise at this level?: 150+ min  Stress: Stress Concern Present (03/01/2023)   [employer] of Occupational Health - Occupational Stress Questionnaire    Feeling of Stress : To some extent  Social Connections: Moderately  Isolated (10/12/2023)   Social Connection and Isolation Panel    Frequency of Communication with Friends and Family: Never    Frequency of Social Gatherings with Friends and Family: More than three times a week    Attends Religious Services: 1 to 4 times per year    Active Member of [employer] or Organizations: No    Attends Banker Meetings: Never    Marital Status: Never married  Intimate Partner Violence: Not At Risk (10/12/2023)   Humiliation, Afraid, Rape, and Kick questionnaire    Fear of Current or Ex-Partner: No    Emotionally Abused: No    Physically Abused: No    Sexually Abused: No                                                                                                  Objective:  Physical Exam: BP (!) 146/94   Pulse 87   Temp 97.6 F (36.4 C) (Temporal)   Ht 6' (1.829 m)   Wt 237 lb 6.4 oz (107.7 kg)   SpO2 97%   BMI 32.20 kg/m   Wt Readings from Last 3 Encounters:  12/24/23 237 lb 6.4 oz (107.7 kg)  11/22/23 245 lb 9.6 oz (111.4 kg)  11/21/23 244 lb (110.7 kg)    Physical Exam  MEASUREMENTS: BMI- 32.0. GENERAL: Alert, cooperative, well developed, no acute distress HEENT: Normocephalic, normal oropharynx, moist mucous membranes CHEST: Clear to auscultation bilaterally, no wheezes, rhonchi, or crackles CARDIOVASCULAR: Normal heart rate and rhythm, S1 and S2 normal without murmurs ABDOMEN: Soft, non-tender, non-distended, without organomegaly, normal bowel sounds EXTREMITIES: No cyanosis or edema NEUROLOGICAL: Cranial nerves grossly intact, moves all extremities without  gross motor or sensory deficit   Physical Exam  VAS US  LOWER EXTREMITY VENOUS REFLUX Result Date: 11/01/2023  Lower Venous Reflux Study Patient Name:  Phillip Gill  Date of Exam:   11/01/2023 Medical Rec #: [id number]          Accession #:    [id number] Date of Birth: 1976-06-18         Patient Gender: M Patient Age:   89 years Exam Location:  [address] Procedure:      VAS US  LOWER EXTREMITY VENOUS REFLUX Referring Phys: BEVERLEY HUMMER --------------------------------------------------------------------------------  Indications: Edema.  Performing Technologist: Duwaine Hives RVS  Examination Guidelines: A complete evaluation includes B-mode imaging, spectral Doppler, color Doppler, and power Doppler as needed of all accessible portions of each vessel. Bilateral testing is considered an integral part of a complete examination. Limited examinations for reoccurring indications may be performed as noted. The reflux portion of the exam is performed with the patient in reverse Trendelenburg. Significant venous reflux is defined as >500 ms in the superficial venous system, and >1 second in the deep venous system.  Venous Reflux Times +--------------+---------+------+-----------+------------+--------+ RIGHT         Reflux NoRefluxReflux TimeDiameter cmsComments                         Yes                                  +--------------+---------+------+-----------+------------+--------+  CFV                     yes   >1 second                      +--------------+---------+------+-----------+------------+--------+ FV mid        no                                             +--------------+---------+------+-----------+------------+--------+ Popliteal     no                                             +--------------+---------+------+-----------+------------+--------+ GSV at SFJ    no                            .33               +--------------+---------+------+-----------+------------+--------+ GSV prox thighno                            .29              +--------------+---------+------+-----------+------------+--------+ GSV mid thigh no                            .30              +--------------+---------+------+-----------+------------+--------+ GSV dist thighno                            .23              +--------------+---------+------+-----------+------------+--------+ GSV at knee   no                            .37              +--------------+---------+------+-----------+------------+--------+ GSV prox calf no                            .33              +--------------+---------+------+-----------+------------+--------+ SSV Pop Fossa           yes    >500 ms      .43              +--------------+---------+------+-----------+------------+--------+ SSV prox calf           yes    >500 ms      .35              +--------------+---------+------+-----------+------------+--------+  +--------------+---------+------+-----------+------------+--------+ LEFT          Reflux NoRefluxReflux TimeDiameter cmsComments                         Yes                                  +--------------+---------+------+-----------+------------+--------+ CFV           no                                             +--------------+---------+------+-----------+------------+--------+  FV mid        no                                             +--------------+---------+------+-----------+------------+--------+ Popliteal     no                                             +--------------+---------+------+-----------+------------+--------+ GSV at SFJ    no                            .60              +--------------+---------+------+-----------+------------+--------+ GSV prox thighno                            .22               +--------------+---------+------+-----------+------------+--------+ GSV mid thigh no                            .30              +--------------+---------+------+-----------+------------+--------+ GSV dist thighno                            .32              +--------------+---------+------+-----------+------------+--------+ GSV at knee   no                            .24              +--------------+---------+------+-----------+------------+--------+ GSV prox calf no                            .26              +--------------+---------+------+-----------+------------+--------+ SSV Pop Fossa no                            .35              +--------------+---------+------+-----------+------------+--------+ SSV prox calf no                            .37              +--------------+---------+------+-----------+------------+--------+   Summary: Right: - No evidence of deep vein thrombosis seen in the right lower extremity, from the common femoral through the popliteal veins. - Venous reflux is noted in the right common femoral vein. - Venous reflux is noted in the right short saphenous vein.  Left: - No evidence of deep vein thrombosis seen in the left lower extremity, from the common femoral through the popliteal veins. - There is no evidence of venous reflux seen in the left lower extremity.  *See table(s) above for measurements and observations. Electronically signed by Norman Serve on 11/01/2023 at 1:34:02 PM.    Final     Recent Results (from the past 2160 hours)  Basic Metabolic Panel (BMET)  Status: Abnormal   Collection Time: 10/07/23  8:08 AM  Result Value Ref Range   Sodium 127 (L) 135 - 145 mEq/L   Potassium 4.2 3.5 - 5.1 mEq/L   Chloride 87 (L) 96 - 112 mEq/L   CO2 28 19 - 32 mEq/L   Glucose, Bld 782 (HH) 70 - 99 mg/dL   BUN 20 6 - 23 mg/dL   Creatinine, Ser 8.19 (H) 0.40 - 1.50 mg/dL   GFR 55.64 (L) >39.99 mL/min    Comment: Calculated using the  CKD-EPI Creatinine Equation (2021)   Calcium  9.2 8.4 - 10.5 mg/dL  Urinalysis, Routine w reflex microscopic -Urine, Clean Catch     Status: Abnormal   Collection Time: 10/07/23  6:10 PM  Result Value Ref Range   Color, Urine YELLOW YELLOW   APPearance CLEAR CLEAR   Specific Gravity, Urine 1.010 1.005 - 1.030   pH 5.5 5.0 - 8.0   Glucose, UA >=500 (A) NEGATIVE mg/dL   Hgb urine dipstick NEGATIVE NEGATIVE   Bilirubin Urine NEGATIVE NEGATIVE   Ketones, ur NEGATIVE NEGATIVE mg/dL   Protein, ur NEGATIVE NEGATIVE mg/dL   Nitrite NEGATIVE NEGATIVE   Leukocytes,Ua NEGATIVE NEGATIVE    Comment: Performed at [facility], [address] [place], [place] [address]  CBG monitoring, ED     Status: Abnormal   Collection Time: 10/07/23  6:10 PM  Result Value Ref Range   Glucose-Capillary >600 (HH) 70 - 99 mg/dL    Comment: Glucose reference range applies only to samples taken after fasting for at least 8 hours.  Urinalysis, Microscopic (reflex)     Status: None   Collection Time: 10/07/23  6:10 PM  Result Value Ref Range   RBC / HPF 0-5 0 - 5 RBC/hpf   WBC, UA 0-5 0 - 5 WBC/hpf   Bacteria, UA NONE SEEN NONE SEEN   Squamous Epithelial / HPF NONE SEEN 0 - 5 /HPF    Comment: Performed at [facility], [address] [place], [place] [address]  Basic metabolic panel     Status: Abnormal   Collection Time: 10/07/23  6:18 PM  Result Value Ref Range   Sodium 127 (L) 135 - 145 mmol/L   Potassium 4.0 3.5 - 5.1 mmol/L   Chloride 88 (L) 98 - 111 mmol/L   CO2 26 22 - 32 mmol/L   Glucose, Bld 707 (HH) 70 - 99 mg/dL    Comment: RESULT CALLED TO, READ BACK BY AND VERIFIED WITH SOPHIA GOUGE @ 1912 10/07/2023 PERRY, J. Glucose reference range applies only to samples taken after fasting for at least 8 hours.    BUN 22 (H) 6 - 20 mg/dL   Creatinine, Ser 8.36 (H) 0.61 - 1.24 mg/dL   Calcium  8.7 (L) 8.9 - 10.3 mg/dL   GFR, Estimated 52 (L) >60 mL/min    Comment: (NOTE) Calculated  using the CKD-EPI Creatinine Equation (2021)    Anion gap 13 5 - 15    Comment: Performed at [facility], [address] [place], [place] [address]  CBC     Status: Abnormal   Collection Time: 10/07/23  6:18 PM  Result Value Ref Range   WBC 12.8 (H) 4.0 - 10.5 K/uL   RBC 4.73 4.22 - 5.81 MIL/uL   Hemoglobin 13.7 13.0 - 17.0 g/dL   HCT 60.6 60.9 - 47.9 %   MCV 83.1 80.0 - 100.0 fL   MCH 29.0 26.0 -  34.0 pg   MCHC 34.9 30.0 - 36.0 g/dL   RDW 87.3 88.4 - 84.4 %   Platelets 230 150 - 400 K/uL   nRBC 0.0 0.0 - 0.2 %    Comment: Performed at [facility], [address] [place], [place] [address]  I-Stat venous blood gas, [facility], [facility], [facility])     Status: Abnormal   Collection Time: 10/07/23  6:20 PM  Result Value Ref Range   pH, Ven 7.413 7.25 - 7.43   pCO2, Ven 43.9 (L) 44 - 60 mmHg   pO2, Ven 60 (H) 32 - 45 mmHg   Bicarbonate 28.0 20.0 - 28.0 mmol/L   TCO2 29 22 - 32 mmol/L   O2 Saturation 91 %   Acid-Base Excess 3.0 (H) 0.0 - 2.0 mmol/L   Sodium 130 (L) 135 - 145 mmol/L   Potassium 4.2 3.5 - 5.1 mmol/L   Calcium , Ion 1.09 (L) 1.15 - 1.40 mmol/L   HCT 43.0 39.0 - 52.0 %   Hemoglobin 14.6 13.0 - 17.0 g/dL   Collection site IV start    Drawn by Nurse    Sample type VENOUS   Beta-hydroxybutyric acid     Status: None   Collection Time: 10/07/23  7:30 PM  Result Value Ref Range   Beta-Hydroxybutyric Acid 0.11 0.05 - 0.27 mmol/L    Comment: Performed at [facility], [address], [place], [place] [address]  CBG monitoring, ED     Status: Abnormal   Collection Time: 10/07/23  8:32 PM  Result Value Ref Range   Glucose-Capillary >600 (HH) 70 - 99 mg/dL    Comment: Glucose reference range applies only to samples taken after fasting for at least 8 hours.  CBG monitoring, ED     Status: Abnormal   Collection Time: 10/07/23  9:26 PM  Result Value Ref Range   Glucose-Capillary 559 (HH) 70 - 99 mg/dL    Comment: Glucose reference range applies only to  samples taken after fasting for at least 8 hours.   Comment 1 Notify RN   CBG monitoring, ED     Status: Abnormal   Collection Time: 10/07/23  9:59 PM  Result Value Ref Range   Glucose-Capillary 523 (HH) 70 - 99 mg/dL    Comment: Glucose reference range applies only to samples taken after fasting for at least 8 hours.   Comment 1 Notify RN   CBG monitoring, ED     Status: Abnormal   Collection Time: 10/07/23 10:28 PM  Result Value Ref Range   Glucose-Capillary 456 (H) 70 - 99 mg/dL    Comment: Glucose reference range applies only to samples taken after fasting for at least 8 hours.  CBG monitoring, ED     Status: Abnormal   Collection Time: 10/07/23 10:59 PM  Result Value Ref Range   Glucose-Capillary 369 (H) 70 - 99 mg/dL    Comment: Glucose reference range applies only to samples taken after fasting for at least 8 hours.   Comment 1 Notify RN   POCT glucose (manual entry)     Status: Abnormal   Collection Time: 10/08/23 11:07 AM  Result Value Ref Range   POC Glucose 432 (A) 70 - 99 mg/dl  POCT glycosylated hemoglobin (Hb A1C)     Status: Abnormal   Collection Time: 10/08/23 11:09 AM  Result Value Ref Range   Hemoglobin A1C 10.3 (A) 4.0 - 5.6 %   HbA1c POC (<> result, manual entry)  HbA1c, POC (prediabetic range)     HbA1c, POC (controlled diabetic range)    C-peptide     Status: None   Collection Time: 10/08/23 11:21 AM  Result Value Ref Range   C-Peptide 2.87 0.80 - 3.85 ng/mL  Basic Metabolic Panel (BMET)     Status: Abnormal   Collection Time: 10/08/23 11:21 AM  Result Value Ref Range   Sodium 130 (L) 135 - 145 mEq/L   Potassium 4.4 3.5 - 5.1 mEq/L   Chloride 94 (L) 96 - 112 mEq/L   CO2 27 19 - 32 mEq/L   Glucose, Bld 523 (HH) 70 - 99 mg/dL   BUN 18 6 - 23 mg/dL   Creatinine, Ser 8.68 0.40 - 1.50 mg/dL   GFR 35.06 >39.99 mL/min    Comment: Calculated using the CKD-EPI Creatinine Equation (2021)   Calcium  9.1 8.4 - 10.5 mg/dL  Urinalysis w microscopic + reflex  cultur     Status: Abnormal   Collection Time: 10/08/23 11:22 AM   Specimen: Urine  Result Value Ref Range   Color, Urine YELLOW YELLOW   APPearance CLEAR CLEAR   Specific Gravity, Urine 1.036 (H) 1.001 - 1.035   pH 6.0 5.0 - 8.0   Glucose, UA 3+ (A) NEGATIVE   Bilirubin Urine NEGATIVE NEGATIVE   Ketones, ur NEGATIVE NEGATIVE   Hgb urine dipstick NEGATIVE NEGATIVE   Protein, ur TRACE (A) NEGATIVE   Nitrites, Initial NEGATIVE NEGATIVE   Leukocyte Esterase NEGATIVE NEGATIVE   WBC, UA NONE SEEN 0 - 5 /HPF   RBC / HPF NONE SEEN 0 - 2 /HPF   Squamous Epithelial / HPF NONE SEEN < OR = 5 /HPF   Bacteria, UA NONE SEEN NONE SEEN /HPF   Hyaline Cast NONE SEEN NONE SEEN /LPF   Note      Comment: This urine was analyzed for the presence of WBC,  RBC, bacteria, casts, and other formed elements.  Only those elements seen were reported. [name] .   Microalbumin / creatinine urine ratio     Status: Abnormal   Collection Time: 10/08/23 11:22 AM  Result Value Ref Range   Microalb, Ur 3.8 (H) 0.0 - 1.9 mg/dL   Creatinine,U 49.6 mg/dL   Microalb Creat Ratio 76.3 (H) 0.0 - 30.0 mg/g  REFLEXIVE URINE CULTURE     Status: None   Collection Time: 10/08/23 11:22 AM  Result Value Ref Range   Reflexve Urine Culture      Comment: NO CULTURE INDICATED  CBG monitoring, ED     Status: Abnormal   Collection Time: 10/11/23  4:55 PM  Result Value Ref Range   Glucose-Capillary 479 (H) 70 - 99 mg/dL    Comment: Glucose reference range applies only to samples taken after fasting for at least 8 hours.  Lipase, blood     Status: None   Collection Time: 10/11/23  5:12 PM  Result Value Ref Range   Lipase 40 11 - 51 U/L    Comment: Performed at [facility], [address] [place], [place] [address]  Comprehensive metabolic panel     Status: Abnormal   Collection Time: 10/11/23  5:12 PM  Result Value Ref Range   Sodium 131 (L) 135 - 145 mmol/L   Potassium 3.6 3.5 - 5.1 mmol/L   Chloride 91 (L) 98 - 111  mmol/L   CO2 25 22 - 32 mmol/L   Glucose, Bld 450 (H) 70 - 99 mg/dL    Comment: Glucose  reference range applies only to samples taken after fasting for at least 8 hours.   BUN 30 (H) 6 - 20 mg/dL   Creatinine, Ser 8.07 (H) 0.61 - 1.24 mg/dL   Calcium  9.4 8.9 - 10.3 mg/dL   Total Protein 8.7 (H) 6.5 - 8.1 g/dL   Albumin 4.2 3.5 - 5.0 g/dL   AST 20 15 - 41 U/L   ALT 20 0 - 44 U/L   Alkaline Phosphatase 97 38 - 126 U/L   Total Bilirubin 1.0 0.0 - 1.2 mg/dL   GFR, Estimated 43 (L) >60 mL/min    Comment: (NOTE) Calculated using the CKD-EPI Creatinine Equation (2021)    Anion gap 15 5 - 15    Comment: Performed at [facility], [address] [place], [place] [address]  CBC     Status: Abnormal   Collection Time: 10/11/23  5:12 PM  Result Value Ref Range   WBC 14.7 (H) 4.0 - 10.5 K/uL   RBC 5.23 4.22 - 5.81 MIL/uL   Hemoglobin 14.9 13.0 - 17.0 g/dL   HCT 55.5 60.9 - 47.9 %   MCV 84.9 80.0 - 100.0 fL   MCH 28.5 26.0 - 34.0 pg   MCHC 33.6 30.0 - 36.0 g/dL   RDW 86.9 88.4 - 84.4 %   Platelets 263 150 - 400 K/uL   nRBC 0.0 0.0 - 0.2 %    Comment: Performed at [facility], [address] [place], [place] [address]  Urinalysis, Routine w reflex microscopic -Urine, Clean Catch     Status: Abnormal   Collection Time: 10/11/23  6:45 PM  Result Value Ref Range   Color, Urine YELLOW YELLOW   APPearance CLEAR CLEAR   Specific Gravity, Urine 1.014 1.005 - 1.030   pH 5.0 5.0 - 8.0   Glucose, UA >=500 (A) NEGATIVE mg/dL   Hgb urine dipstick NEGATIVE NEGATIVE   Bilirubin Urine NEGATIVE NEGATIVE   Ketones, ur NEGATIVE NEGATIVE mg/dL   Protein, ur NEGATIVE NEGATIVE mg/dL   Nitrite NEGATIVE NEGATIVE   Leukocytes,Ua NEGATIVE NEGATIVE   RBC / HPF 0-5 0 - 5 RBC/hpf   WBC, UA 0-5 0 - 5 WBC/hpf   Bacteria, UA NONE SEEN NONE SEEN   Squamous Epithelial / HPF 0-5 0 - 5 /HPF   Mucus PRESENT    Hyaline Casts, UA PRESENT     Comment: Performed at [facility], [address], [place], [place] [address]  I-Stat venous blood gas, [facility] ED, [facility], [facility])     Status: Abnormal   Collection Time: 10/11/23 11:55 PM  Result Value Ref Range   pH, Ven 7.393 7.25 - 7.43   pCO2, Ven 48.9 44 - 60 mmHg   pO2, Ven 47 (H) 32 - 45 mmHg   Bicarbonate 29.8 (H) 20.0 - 28.0 mmol/L   TCO2 31 22 - 32 mmol/L   O2 Saturation 82 %   Acid-Base Excess 4.0 (H) 0.0 - 2.0 mmol/L   Sodium 129 (L) 135 - 145 mmol/L   Potassium 4.5 3.5 - 5.1 mmol/L   Calcium , Ion 1.05 (L) 1.15 - 1.40 mmol/L   HCT 46.0 39.0 - 52.0 %   Hemoglobin 15.6 13.0 - 17.0 g/dL   Sample type VENOUS   I-stat chem 8, ED (not at [facility], [facility] or [facility])     Status: Abnormal   Collection Time: 10/12/23 12:17 AM  Result Value Ref Range   Sodium 129 (L) 135 - 145 mmol/L  Potassium 4.5 3.5 - 5.1 mmol/L   Chloride 90 (L) 98 - 111 mmol/L   BUN 39 (H) 6 - 20 mg/dL   Creatinine, Ser 7.59 (H) 0.61 - 1.24 mg/dL   Glucose, Bld 321 (HH) 70 - 99 mg/dL    Comment: Glucose reference range applies only to samples taken after fasting for at least 8 hours.   Calcium , Ion 1.08 (L) 1.15 - 1.40 mmol/L   TCO2 28 22 - 32 mmol/L   Hemoglobin 16.0 13.0 - 17.0 g/dL   HCT 52.9 60.9 - 47.9 %   Comment NOTIFIED PHYSICIAN   CBG monitoring, ED     Status: Abnormal   Collection Time: 10/12/23  1:56 AM  Result Value Ref Range   Glucose-Capillary >600 (HH) 70 - 99 mg/dL    Comment: Glucose reference range applies only to samples taken after fasting for at least 8 hours.  Basic metabolic panel     Status: Abnormal   Collection Time: 10/12/23  7:49 AM  Result Value Ref Range   Sodium 131 (L) 135 - 145 mmol/L   Potassium 4.2 3.5 - 5.1 mmol/L   Chloride 92 (L) 98 - 111 mmol/L   CO2 25 22 - 32 mmol/L   Glucose, Bld 476 (H) 70 - 99 mg/dL    Comment: Glucose reference range applies only to samples taken after fasting for at least 8 hours.   BUN 34 (H) 6 - 20 mg/dL   Creatinine, Ser 8.09 (H) 0.61 - 1.24 mg/dL   Calcium  8.7 (L) 8.9 - 10.3 mg/dL   GFR,  Estimated 43 (L) >60 mL/min    Comment: (NOTE) Calculated using the CKD-EPI Creatinine Equation (2021)    Anion gap 14 5 - 15    Comment: Performed at [facility], [address], [place], [place] [address]  CBC with Differential/Platelet     Status: Abnormal   Collection Time: 10/12/23  7:49 AM  Result Value Ref Range   WBC 13.2 (H) 4.0 - 10.5 K/uL   RBC 4.83 4.22 - 5.81 MIL/uL   Hemoglobin 13.7 13.0 - 17.0 g/dL   HCT 57.8 60.9 - 47.9 %   MCV 87.2 80.0 - 100.0 fL   MCH 28.4 26.0 - 34.0 pg   MCHC 32.5 30.0 - 36.0 g/dL   RDW 86.7 88.4 - 84.4 %   Platelets 241 150 - 400 K/uL   nRBC 0.0 0.0 - 0.2 %   Neutrophils Relative % 65 %   Neutro Abs 8.5 (H) 1.7 - 7.7 K/uL   Lymphocytes Relative 27 %   Lymphs Abs 3.5 0.7 - 4.0 K/uL   Monocytes Relative 8 %   Monocytes Absolute 1.1 (H) 0.1 - 1.0 K/uL   Eosinophils Relative 0 %   Eosinophils Absolute 0.0 0.0 - 0.5 K/uL   Basophils Relative 0 %   Basophils Absolute 0.0 0.0 - 0.1 K/uL   Immature Granulocytes 0 %   Abs Immature Granulocytes 0.05 0.00 - 0.07 K/uL    Comment: Performed at [facility], [address], [place], [place] [address]  CBG monitoring, ED     Status: Abnormal   Collection Time: 10/12/23  8:28 AM  Result Value Ref Range   Glucose-Capillary 449 (H) 70 - 99 mg/dL    Comment: Glucose reference range applies only to samples taken after fasting for at least 8 hours.  Glucose, capillary     Status: Abnormal   Collection Time: 10/12/23 12:19 PM  Result Value  Ref Range   Glucose-Capillary 373 (H) 70 - 99 mg/dL    Comment: Glucose reference range applies only to samples taken after fasting for at least 8 hours.  Glucose, capillary     Status: Abnormal   Collection Time: 10/12/23  4:21 PM  Result Value Ref Range   Glucose-Capillary 426 (H) 70 - 99 mg/dL    Comment: Glucose reference range applies only to samples taken after fasting for at least 8 hours.  Glucose, capillary     Status: Abnormal   Collection  Time: 10/12/23  6:58 PM  Result Value Ref Range   Glucose-Capillary 435 (H) 70 - 99 mg/dL    Comment: Glucose reference range applies only to samples taken after fasting for at least 8 hours.  Glucose, capillary     Status: Abnormal   Collection Time: 10/12/23  8:28 PM  Result Value Ref Range   Glucose-Capillary 283 (H) 70 - 99 mg/dL    Comment: Glucose reference range applies only to samples taken after fasting for at least 8 hours.  Glucose, capillary     Status: Abnormal   Collection Time: 10/13/23  7:55 AM  Result Value Ref Range   Glucose-Capillary 164 (H) 70 - 99 mg/dL    Comment: Glucose reference range applies only to samples taken after fasting for at least 8 hours.  CBC     Status: Abnormal   Collection Time: 10/13/23  9:03 AM  Result Value Ref Range   WBC 10.6 (H) 4.0 - 10.5 K/uL   RBC 4.28 4.22 - 5.81 MIL/uL   Hemoglobin 12.3 (L) 13.0 - 17.0 g/dL   HCT 63.2 (L) 60.9 - 47.9 %   MCV 85.7 80.0 - 100.0 fL   MCH 28.7 26.0 - 34.0 pg   MCHC 33.5 30.0 - 36.0 g/dL   RDW 87.0 88.4 - 84.4 %   Platelets 199 150 - 400 K/uL   nRBC 0.0 0.0 - 0.2 %    Comment: Performed at [facility], [address] [place], [place] [address]  Basic metabolic panel     Status: Abnormal   Collection Time: 10/13/23  9:03 AM  Result Value Ref Range   Sodium 136 135 - 145 mmol/L   Potassium 3.9 3.5 - 5.1 mmol/L   Chloride 103 98 - 111 mmol/L   CO2 25 22 - 32 mmol/L   Glucose, Bld 234 (H) 70 - 99 mg/dL    Comment: Glucose reference range applies only to samples taken after fasting for at least 8 hours.   BUN 22 (H) 6 - 20 mg/dL   Creatinine, Ser 8.65 (H) 0.61 - 1.24 mg/dL   Calcium  8.8 (L) 8.9 - 10.3 mg/dL   GFR, Estimated >39 >39 mL/min    Comment: (NOTE) Calculated using the CKD-EPI Creatinine Equation (2021)    Anion gap 8 5 - 15    Comment: Performed at [facility], [address] [place], [place] [address]  Glucose, capillary     Status: Abnormal   Collection Time: 10/13/23  11:59 AM  Result Value Ref Range   Glucose-Capillary 268 (H) 70 - 99 mg/dL    Comment: Glucose reference range applies only to samples taken after fasting for at least 8 hours.   Comment 1 Document in Chart   Glucose, capillary     Status: Abnormal   Collection Time: 10/13/23  4:55 PM  Result Value Ref Range   Glucose-Capillary 223 (H) 70 - 99 mg/dL  Comment: Glucose reference range applies only to samples taken after fasting for at least 8 hours.  Glucose, capillary     Status: Abnormal   Collection Time: 10/13/23  8:00 PM  Result Value Ref Range   Glucose-Capillary 282 (H) 70 - 99 mg/dL    Comment: Glucose reference range applies only to samples taken after fasting for at least 8 hours.  Basic metabolic panel with GFR     Status: Abnormal   Collection Time: 10/14/23  3:09 AM  Result Value Ref Range   Sodium 138 135 - 145 mmol/L   Potassium 3.8 3.5 - 5.1 mmol/L   Chloride 106 98 - 111 mmol/L   CO2 27 22 - 32 mmol/L   Glucose, Bld 216 (H) 70 - 99 mg/dL    Comment: Glucose reference range applies only to samples taken after fasting for at least 8 hours.   BUN 17 6 - 20 mg/dL   Creatinine, Ser 8.77 0.61 - 1.24 mg/dL   Calcium  9.2 8.9 - 10.3 mg/dL   GFR, Estimated >39 >39 mL/min    Comment: (NOTE) Calculated using the CKD-EPI Creatinine Equation (2021)    Anion gap 5 5 - 15    Comment: Performed at [facility], [address] [place], [place] [address]  Glucose, capillary     Status: Abnormal   Collection Time: 10/14/23  8:35 AM  Result Value Ref Range   Glucose-Capillary 209 (H) 70 - 99 mg/dL    Comment: Glucose reference range applies only to samples taken after fasting for at least 8 hours.  Glucose, capillary     Status: Abnormal   Collection Time: 10/14/23 11:49 AM  Result Value Ref Range   Glucose-Capillary 183 (H) 70 - 99 mg/dL    Comment: Glucose reference range applies only to samples taken after fasting for at least 8 hours.  T-helper cells (CD4) count (not  at [facility])     Status: None   Collection Time: 11/08/23 10:43 AM  Result Value Ref Range   CD4 T Helper % 33 30 - 61 %   Absolute CD4 1,116 490 - 1,740 cells/uL   Total lymphocyte count 3,403 850 - 3,900 cells/uL    Comment: The analytical performance characteristics of this assay  have been determined by [employer]. The  modifications have not been cleared or approved by the  FDA. This assay has been validated pursuant to the CLIA  regulations and is used for clinical purposes.   Hepatitis B surface antibody,qualitative     Status: None   Collection Time: 11/08/23 10:43 AM  Result Value Ref Range   Hep B S Ab NON-REACTIVE NON-REACTIVE  Hepatitis B DNA, ultraquantitative, PCR     Status: None   Collection Time: 11/08/23 10:43 AM  Result Value Ref Range   Hepatitis B DNA Not Detected IU/mL   Hepatitis B virus DNA Not Detected Log IU/mL    Comment: . Reference Range:                          Not Detected       IU/mL                          Not Detected   Log IU/mL . The analytical performance characteristics of this assay have been determined by [employer]. The modifications have not been cleared or approved by the U.S. Food and Drug  Administration. This assay has been validated pursuant to the CLIA regulations and is used for clinical purposes. .   Hepatitis B surface antigen     Status: None   Collection Time: 11/08/23 10:43 AM  Result Value Ref Range   Hepatitis B Surface Ag NON-REACTIVE NON-REACTIVE    Comment: . For additional information, please refer to  http://education.[facility].com/faq/FAQ202  (This link is being provided for informational/ educational purposes only.) .   HIV-1 RNA quant-no reflex-bld     Status: Abnormal   Collection Time: 11/08/23 10:43 AM  Result Value Ref Range   HIV 1 RNA Quant 53 (H) NOT DETECTED copies/mL   HIV-1 RNA Quant, Log 1.72 (H) NOT DETECTED Log copies/mL    Comment: . This test was performed using  Real-Time Polymerase Chain Reaction. . Reportable Range: 20 copies/mL to 10,000,000 copies/mL (1.30 log copies/mL to 7.00 log copies/mL).   Basic Metabolic Panel (BMET)     Status: Abnormal   Collection Time: 11/29/23  8:51 AM  Result Value Ref Range   Sodium 139 135 - 145 mEq/L   Potassium 3.7 3.5 - 5.1 mEq/L   Chloride 104 96 - 112 mEq/L   CO2 25 19 - 32 mEq/L   Glucose, Bld 111 (H) 70 - 99 mg/dL   BUN 16 6 - 23 mg/dL   Creatinine, Ser 8.77 0.40 - 1.50 mg/dL   GFR 29.34 >39.99 mL/min    Comment: Calculated using the CKD-EPI Creatinine Equation (2021)   Calcium  9.3 8.4 - 10.5 mg/dL        Beverley Adine Hummer, MD, MS

## 2023-12-25 ENCOUNTER — Other Ambulatory Visit (HOSPITAL_COMMUNITY): Payer: Self-pay

## 2023-12-25 ENCOUNTER — Encounter: Payer: Self-pay | Admitting: Family Medicine

## 2023-12-25 ENCOUNTER — Other Ambulatory Visit: Payer: Self-pay

## 2023-12-25 MED ORDER — OLMESARTAN MEDOXOMIL 40 MG PO TABS
40.0000 mg | ORAL_TABLET | Freq: Every day | ORAL | 0 refills | Status: DC
  Filled 2023-12-25 – 2024-01-27 (×3): qty 90, 90d supply, fill #0

## 2023-12-25 MED ORDER — INDAPAMIDE 2.5 MG PO TABS
2.5000 mg | ORAL_TABLET | Freq: Every day | ORAL | 0 refills | Status: DC
  Filled 2023-12-25 – 2024-01-27 (×3): qty 90, 90d supply, fill #0

## 2023-12-26 ENCOUNTER — Other Ambulatory Visit: Payer: Self-pay

## 2023-12-26 ENCOUNTER — Telehealth: Payer: Self-pay

## 2023-12-26 ENCOUNTER — Other Ambulatory Visit (HOSPITAL_COMMUNITY): Payer: Self-pay

## 2023-12-26 ENCOUNTER — Ambulatory Visit (INDEPENDENT_AMBULATORY_CARE_PROVIDER_SITE_OTHER): Payer: MEDICAID | Admitting: Licensed Clinical Social Worker

## 2023-12-26 DIAGNOSIS — F330 Major depressive disorder, recurrent, mild: Secondary | ICD-10-CM

## 2023-12-26 DIAGNOSIS — E1165 Type 2 diabetes mellitus with hyperglycemia: Secondary | ICD-10-CM

## 2023-12-26 DIAGNOSIS — F411 Generalized anxiety disorder: Secondary | ICD-10-CM

## 2023-12-26 DIAGNOSIS — E6609 Other obesity due to excess calories: Secondary | ICD-10-CM

## 2023-12-26 MED ORDER — SEMAGLUTIDE (2 MG/DOSE) 8 MG/3ML ~~LOC~~ SOPN
2.0000 mg | PEN_INJECTOR | SUBCUTANEOUS | 0 refills | Status: DC
  Filled 2023-12-26: qty 9, 84d supply, fill #0

## 2023-12-26 NOTE — Progress Notes (Signed)
 THERAPIST PROGRESS NOTE   Session Date: 12/26/2023  Session Time: 1014 - 1055  Participation Level: Active  MSE/Presentation: Behavior: Appropriate and Sharing Speech: Normal Thought Process: Coherent Cognition: Alert and Appropriate Mood: Negative and Dysphoric Affect: Depressed and Flat Insight: Lacking Appearance: Casual  Type of Therapy: Individual Therapy  Treatment Goals addressed:   Progressing (5) LTG: Reduce frequency, intensity, and duration of depression symptoms so that daily functioning is improved (OP Depression) LTG: Increase coping skills to manage depression and improve ability to perform daily activities (OP Depression) LTG: To not be depressed, have more energy, and stop over eating (OP Depression) STG: Report a decrease in anxiety symptoms as evidenced by an overall reduction in anxiety score by a minimum of 25% on the Generalized Anxiety Disorder Scale (GAD-7) (Anxiety) STG: Phillip Gill will reduce frequency of avoidant behaviors by 50% as evidenced by self-report in therapy sessions (Anxiety)  Not Progressing (1)  Progress Towards Goals: Not Progressing  Interventions: CBT, Solution Focused, and Supportive  Summary: Phillip Gill is a 48 y.o. male with past psych history of MDD and GAD, presenting for follow up therapy session in efforts to improve management of depressive and anxious symptoms.  Patient actively engaged in session, presenting in overall pleasant moods, and congruent affect.  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 Still finding self enjoying new job more than previous due to not being micro-managed, more relaxed patients, and better staffed. Further shared feeling of progressing at a significantly slow pace, due to frequent financial obligations with care maintenance needs and  other financial stress. Pt further detailed feeling like wanting to pull hair out sometimes, detailing stressors surrounding home life and difficulties interacting with aunt residing in home.   Patient responded well to interventions. Patient continues to meet criteria for MDD and GAD. Patient will continue to benefit from engagement in outpatient therapy due to being the least restrictive service to meet presenting needs.      12/26/2023   10:20 AM 12/05/2023    9:18 AM 11/22/2023   10:38 AM 11/21/2023    9:01 AM  GAD 7 : Generalized Anxiety Score  Nervous, Anxious, on Edge 3 3 3 3   Control/stop worrying 3 3 3 3   Worry too much - different things 3 3 3 3   Trouble relaxing 1 0 2 2  Restless 3 1 2 2   Easily annoyed or irritable 3 3 3 3   Afraid - awful might happen 1 3 3 3   Total GAD 7 Score 17 16 19 19   Anxiety Difficulty Extremely difficult Extremely difficult Extremely difficult Extremely difficult      12/26/2023   10:21 AM 12/24/2023    8:41 AM 12/05/2023    9:19 AM 11/22/2023   10:36 AM 11/21/2023    9:00 AM  Depression screen PHQ 2/9  Decreased Interest 3 3 3 3 3   Down, Depressed, Hopeless 3 3 3 3 3   PHQ - 2 Score 6 6 6 6 6   Altered sleeping 3  1 3 3   Tired, decreased energy 3  1 3 3   Change in appetite 0  3 3 3   Feeling bad or failure about yourself  3  3 3 3   Trouble concentrating 3  3 3 3   Moving slowly or fidgety/restless 1  1 3 3   Suicidal thoughts 1  1 3  3  PHQ-9 Score 20  19 27 27   Difficult doing work/chores Extremely dIfficult  Extremely dIfficult Extremely dIfficult Extremely dIfficult   Suicidal/Homicidal: Passive, No plan to harm self or others  Therapist Response: Clinician utilized CBT, solution focused, and supportive reflection interventions to support pt in navigation presenting sxs.   Clinician actively greeted pt upon presenting for today's visit, actively engaging in introductory check-in, assessing presenting moods and affect, further prompting patient's  brief reflection of factors contributing to presentation. Engaged pt in reassessing presenting depressive and anxious sxs via PHQ-9 and GAD-7, noting of minor increase in both sxs scores, eliciting pt's thoughts and recounts of observed sxs and stressors. Actively listened to patient's reflections of events of the past 3 weeks, supporting pt in exploring thoughts, feelings, and perspectives surrounding recent challenges and stressors as well as pt's individual efforts at improving management of symptoms. Utilized open ended questions to support pt in greater reflection of challenges and stressors, and individual efforts at managing such, as well as utilizing socratic questioning in efforts to prompt greater critical processing of thoughts and feelings, in efforts of supporting pt in challenging irrational negative thoughts and resistance.  Clinician reassessed presentation of sxs and presence of safety concerns. Clinician provided support and empathy to patient during session.  Plan: Return again in 2 weeks.  Diagnosis:  Encounter Diagnoses  Name Primary?   MDD (major depressive disorder), recurrent episode, mild (HCC) Yes   GAD (generalized anxiety disorder)      Collaboration of Care: Other None necessary at this time.  Patient/Guardian was advised Release of Information must be obtained prior to any record release in order to collaborate their care with an outside provider. Patient/Guardian was advised if they have not already done so to contact the registration department to sign all necessary forms in order for us  to release information regarding their care.   Consent: Patient/Guardian gives verbal consent for treatment and assignment of benefits for services provided during this visit. Patient/Guardian expressed understanding and agreed to proceed.   Phillip Gill, Phillip Gill, Phillip Gill 12/26/2023,  10:27 AM

## 2023-12-26 NOTE — Progress Notes (Signed)
   12/26/2023  Patient ID: Phillip Gill, male   DOB: July 04, 1975, 48 y.o.   MRN: [id number]  In basket message from patient stating he has not been able to successfully refill his Ozempic  2mg  prescription.  Medication was recently sent to [facility] by Dr. Sebastian, so I reached out to the pharmacy to check on fill status.  Insurance will not pay for medication at this time based on last refill on 6/3; and they are not providing the date they will cover the claim.  I recommend patient contact [facility] again Monday to see if refill will go through on insurance.    [name], PharmD, DPLA

## 2023-12-27 NOTE — Progress Notes (Unsigned)
 [facility]           OF [place]  History and Physical   Phillip Gill is a 48 y.o. male who presents with bilateral leg swelling.    Pt works nights as a Lawyer.  He started getting bilateral leg swelling and he states that he had been started on a fluid pill and started wearing ted hose.  He states that his swelling did improve.  He states that he was taken off the fluid pill it was dehydrating him.  His renal function returned to normal.  He states that his leg swelling has significantly improved.  He has never had surgery on his legs.  He does not have hx of blood clots.  He does not have family hx of venous disease.  He does have an aunt that had to have a toe amputation due to DM.  He does not have skin color changes.  He states he quit smoking about 2 years ago and quit vaping about a year ago.    Pt has hx of DM, HIV, HTN  The pt is on a statin for cholesterol management.  The pt is not on a daily aspirin.   Other AC:  none The pt is on ARB for hypertension.   The pt is  on medication for diabetes.   Tobacco hx:  former  Pt does not have family hx of AAA.  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 Efrain- sees him every 4 months   Hypertension    IBS (irritable bowel syndrome)    Migraine    none in years (02/16/2015   Overweight 07/20/2015   Renal insufficiency 12/29/2013    Past Surgical History:  Procedure Laterality Date   CO2 LASER APPLICATION N/A 02/11/2013   Procedure: CO2 LASER APPLICATION;  Surgeon: Bernarda Ned, MD;  Location: [facility];  Service: General;  Laterality: N/A;   FOOT SURGERY     HIGH RESOLUTION ANOSCOPY N/A 02/11/2013   Procedure: HIGH RESOLUTION ANOSCOPY  WITH BIOPSY, LASER ABLATION;  Surgeon: Bernarda Ned, MD;  Location: [facility];  Service: General;  Laterality: N/A;   WISDOM TOOTH EXTRACTION      Social History   Socioeconomic History   Marital status: Single    Spouse name: Not on file   Number of children: Not on file   Years of education: Not on file   Highest education level: Not on file  Occupational History   Not on file  Tobacco Use   Smoking status: Former    Current packs/day: 0.00    Average packs/day: 1 pack/day for 18.0 years (18.0 ttl pk-yrs)    Types: Cigarettes    Start date: 07/03/2003    Quit date: 07/02/2021    Years since quitting: 2.4    Passive exposure: Never   Smokeless tobacco: Never   Tobacco comments:    Pt vapes everyday  Vaping Use   Vaping status: Former   Quit date: 12/01/2022   Substances: Nicotine   Substance and Sexual Activity   Alcohol use: Yes  Comment: occasional use   Drug use: No   Sexual activity: Not on file    Comment: accepted condoms  Other Topics Concern   Not on file  Social History Narrative   Grew up in [place], now living in [place],-  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    Finished HS.   Doing online classes with [employer]- studying accounting   Previously 2 years of classes at [employer].    Has Partner- [name]- together for 1 year.    Has 2 girls- (born in 2000).          Social Drivers of Corporate investment banker Strain: High Risk (02/05/2023)   Overall Financial Resource Strain (CARDIA)    Difficulty of Paying Living Expenses: Very hard  Food Insecurity: Food Insecurity Present (11/20/2023)   Hunger Vital Sign    Worried About Running Out of Food in the Last Year: Sometimes true    Ran Out of Food in the Last Year: Sometimes true  Transportation Needs: No Transportation Needs (10/12/2023)   PRAPARE - Administrator, Civil Service (Medical): No    Lack of Transportation (Non-Medical): No  Physical  Activity: Sufficiently Active (07/16/2018)   Received from [facility]   Exercise Vital Sign    On average, how many days per week do you engage in moderate to strenuous exercise (like a brisk walk)?: 4 days    On average, how many minutes do you engage in exercise at this level?: 150+ min  Stress: Stress Concern Present (03/01/2023)   [employer] of Occupational Health - Occupational Stress Questionnaire    Feeling of Stress : To some extent  Social Connections: Moderately Isolated (10/12/2023)   Social Connection and Isolation Panel    Frequency of Communication with Friends and Family: Never    Frequency of Social Gatherings with Friends and Family: More than three times a week    Attends Religious Services: 1 to 4 times per year    Active Member of [employer] or Organizations: No    Attends Banker Meetings: Never    Marital Status: Never married  Intimate Partner Violence: Not At Risk (10/12/2023)   Humiliation, Afraid, Rape, and Kick questionnaire    Fear of Current or Ex-Partner: No    Emotionally Abused: No    Physically Abused: No    Sexually Abused: No     Family History  Problem Relation Age of Onset   Hypertension Mother    Diabetes Mother    Stroke Mother        cerbral aneurysm   Pancreatic cancer Mother    Colon cancer Father    Hypertension Brother    Sleep apnea Cousin    Mental illness Neg Hx    Stomach cancer Neg Hx    Esophageal cancer Neg Hx     Current Outpatient Medications  Medication Sig Dispense Refill   ARIPiprazole  (ABILIFY ) 5 MG tablet Take 1 tablet (5 mg total) by mouth daily. 90 tablet 0   bictegravir-emtricitabine -tenofovir  AF (BIKTARVY ) 50-200-25 MG TABS tablet TAKE 1 TABLET BY MOUTH EVERY DAY 30 tablet 5   Blood Glucose Monitoring Suppl (BLOOD GLUCOSE MONITOR SYSTEM) w/Device KIT Use to check blood sugar in the morning, at noon, and at bedtime. 1 kit 0   buPROPion  (WELLBUTRIN  XL) 150 MG 24 hr tablet Take 1  tablet (150 mg total) by mouth daily. 90 tablet 0   Continuous Glucose Sensor (FREESTYLE LIBRE 3  SENSOR) MISC Place 1 sensor on the skin every 14 days. Use to check glucose continuously 2 each 4   diclofenac  Sodium (VOLTAREN ) 1 % GEL Apply 4 grams topically 4 (four) times daily to affected joint. 100 g 11   Elastic Bandages & Supports (CARPAL TUNNEL WRIST STABILIZER) MISC 1 each by Does not apply route as needed. 1 each 0   Elastic Bandages & Supports (MEDICAL COMPRESSION STOCKINGS) MISC 1 each by Does not apply route daily. 1 each 0   escitalopram  (LEXAPRO ) 20 MG tablet Take 1 tablet (20 mg total) by mouth daily. 90 tablet 0   esomeprazole  (NEXIUM ) 40 MG capsule Take 1 capsule (40 mg total) by mouth 2 (two) times daily before a meal. 180 capsule 3   finasteride  (PROSCAR ) 5 MG tablet Take 1 tablet (5 mg total) by mouth daily. 30 tablet 1   indapamide  (LOZOL ) 2.5 MG tablet Take 1 tablet (2.5 mg total) by mouth daily. 90 tablet 0   Insulin  Pen Needle 32G X 4 MM MISC Use as directed with insulin  100 each 0   olmesartan  (BENICAR ) 40 MG tablet Take 1 tablet (40 mg total) by mouth daily. 90 tablet 0   rosuvastatin  (CRESTOR ) 10 MG tablet Take 1 tablet (10 mg total) by mouth daily. 30 tablet 2   Semaglutide , 2 MG/DOSE, 8 MG/3ML SOPN Inject 2 mg as directed once a week. 9 mL 0   sildenafil  (VIAGRA ) 100 MG tablet Take 0.5-1 tablets (50-100 mg total) by mouth daily as needed for erectile dysfunction. Do not exceed 100 mg in a day 30 tablet 1   Vitamin D , Ergocalciferol , (DRISDOL ) 1.25 MG (50000 UNIT) CAPS capsule Take 1 capsule (50,000 Units total) by mouth every 7 (seven) days. 8 capsule 0   No current facility-administered medications for this visit.    Allergies  Allergen Reactions   Bactrim [Sulfamethoxazole W/Trimethoprim (Co-Trimoxazole)] Hives and Shortness Of Breath   Sulfa Antibiotics Hives and Shortness Of Breath   Cymbalta  [Duloxetine  Hcl] Diarrhea   Duloxetine  Diarrhea   Metformin  And  Related Nausea And Vomiting   Other Hives and Swelling    Colgate toothpaste    Pork-Derived Products Other (See Comments)    Pt refused to eat pork products.   Zoloft [Sertraline Hcl] Diarrhea   Clindamycin /Lincomycin Rash   Neurontin  [Gabapentin ] Rash    REVIEW OF SYSTEMS:   [X]  denotes positive finding, [ ]  denotes negative finding Cardiac  Comments:  Chest pain or chest pressure:    Shortness of breath upon exertion:    Short of breath when lying flat:    Irregular heart rhythm:        Vascular    Pain in calf, thigh, or hip brought on by ambulation:    Pain in feet at night that wakes you up from your sleep:     Blood clot in your veins:    Leg swelling:  x       Pulmonary    Oxygen at home:    Productive cough:     Wheezing:         Neurologic    Sudden weakness in arms or legs:     Sudden numbness in arms or legs:     Sudden onset of difficulty speaking or slurred speech:    Temporary loss of vision in one eye:     Problems with dizziness:         Gastrointestinal    Blood in stool:     Vomited  blood:         Genitourinary    Burning when urinating:     Blood in urine:        Psychiatric    Major depression:         Hematologic    Bleeding problems:    Problems with blood clotting too easily:        Skin    Rashes or ulcers:        Constitutional    Fever or chills:      PHYSICAL EXAMINATION:  Today's Vitals   12/30/23 1042  BP: (!) 134/91  Pulse: 87  Temp: 98.3 F (36.8 C)  TempSrc: Temporal  SpO2: 94%  Weight: 240 lb 4.8 oz (109 kg)  Height: 6' (1.829 m)  PainSc: 0-No pain   Body mass index is 32.59 kg/m.   General:  WDWN in NAD; vital signs documented above Gait: Not observed HENT: WNL, normocephalic Pulmonary: normal non-labored breathing without wheezing Cardiac: regular HR; without carotid bruits Abdomen: soft, NT, aortic pulse is not palpable Skin: without rashes Vascular Exam/Pulses:  Right Left  Radial 2+ (normal)  2+ (normal)  DP 2+ (normal) 2+ (normal)   Extremities: mild pitting edema from socks just below the knees.  Minimal ankle swelling  Neurologic: A&O X 3;  moving all extremities equally Psychiatric:  The pt has Normal affect.   Non-Invasive Vascular Imaging:   Venous duplex on 11/01/2023: Venous Reflux Times  +--------------+---------+------+-----------+------------+--------+  RIGHT        Reflux NoRefluxReflux TimeDiameter cmsComments                          Yes                                   +--------------+---------+------+-----------+------------+--------+  CFV                    yes   >1 second                       +--------------+---------+------+-----------+------------+--------+  FV mid        no                                              +--------------+---------+------+-----------+------------+--------+  Popliteal    no                                              +--------------+---------+------+-----------+------------+--------+  GSV at SFJ    no                            .33               +--------------+---------+------+-----------+------------+--------+  GSV prox thighno                            .29               +--------------+---------+------+-----------+------------+--------+  GSV mid thigh no                            .  30               +--------------+---------+------+-----------+------------+--------+  GSV dist thighno                            .23               +--------------+---------+------+-----------+------------+--------+  GSV at knee   no                            .37               +--------------+---------+------+-----------+------------+--------+  GSV prox calf no                            .33               +--------------+---------+------+-----------+------------+--------+  SSV Pop Fossa           yes    >500 ms      .43                +--------------+---------+------+-----------+------------+--------+  SSV prox calf           yes    >500 ms      .35               +--------------+---------+------+-----------+------------+--------+     +--------------+---------+------+-----------+------------+--------+  LEFT         Reflux NoRefluxReflux TimeDiameter cmsComments                          Yes                                   +--------------+---------+------+-----------+------------+--------+  CFV          no                                              +--------------+---------+------+-----------+------------+--------+  FV mid        no                                              +--------------+---------+------+-----------+------------+--------+  Popliteal    no                                              +--------------+---------+------+-----------+------------+--------+  GSV at SFJ    no                            .60               +--------------+---------+------+-----------+------------+--------+  GSV prox thighno                            .22               +--------------+---------+------+-----------+------------+--------+  GSV mid thigh no                            .  30               +--------------+---------+------+-----------+------------+--------+  GSV dist thighno                            .32               +--------------+---------+------+-----------+------------+--------+  GSV at knee   no                            .24               +--------------+---------+------+-----------+------------+--------+  GSV prox calf no                            .26               +--------------+---------+------+-----------+------------+--------+  SSV Pop Fossa no                            .35               +--------------+---------+------+-----------+------------+--------+  SSV prox calf no                            .37                +--------------+---------+------+-----------+------------+--------+   Summary:  Right:  - No evidence of deep vein thrombosis seen in the right lower extremity, from the common femoral through the popliteal veins.  - Venous reflux is noted in the right common femoral vein.  - Venous reflux is noted in the right short saphenous vein.    Left:  - No evidence of deep vein thrombosis seen in the left lower extremity, from the common femoral through the popliteal veins.  - There is no evidence of venous reflux seen in the left lower extremity.     Phillip Gill is a 48 y.o. male who presents with: BLE leg swelling    -pt has easily palpable DP  pedal pulses bilaterally.  -pt does not have evidence of DVT in BLE.  Pt does deep have venous reflux in the right deep CFV and the SSV in the popliteal fossa and proximal calf.  There was no reflux in the LLE. -discussed with pt about wearing knee high 15-20 mmHg compression stockings and pt was measured for these today.   I encouraged him to continue wearing these especially while he is working.   -discussed the importance of leg elevation and how to elevate properly - pt is advised to elevate their legs and a diagram is given to them to demonstrate for pt to lay flat on their back with knees elevated and slightly bent with their feet higher than their knees, which puts their feet higher than their heart for 15 minutes per day.  If pt cannot lay flat, advised to lay as flat as possible.  -pt is advised to continue as much walking as possible and avoid sitting or standing for long periods of time.  Fortunately working as a Lawyer, he is staying active -discussed importance of weight loss and exercise and that water aerobics would also be beneficial.  -handout with recommendations given -pt will f/u as needed.    Lucie Apt, [facility] [phone]  Clinic MD:  [place]

## 2023-12-30 ENCOUNTER — Ambulatory Visit: Payer: MEDICAID | Attending: Surgery | Admitting: Physician Assistant

## 2023-12-30 VITALS — BP 134/91 | HR 87 | Temp 98.3°F | Ht 72.0 in | Wt 240.3 lb

## 2023-12-30 DIAGNOSIS — M7989 Other specified soft tissue disorders: Secondary | ICD-10-CM

## 2024-01-01 ENCOUNTER — Other Ambulatory Visit (HOSPITAL_COMMUNITY): Payer: Self-pay

## 2024-01-01 NOTE — Progress Notes (Signed)
 Diabetes Self-Management Education  Visit Type:  Follow-up  Appt. Start Time: 0845 Appt. End Time: 9077  01/10/2024  [name], identified by name and date of birth, is a 48 y.o. male with a diagnosis of Diabetes: Type 2.    ASSESSMENT  Patient is here today alone for follow up with CGM. Pt reports he continues to work 12 hour night shifts from 7p-7a. Pt c/o hypoglycemia occurring daily and states glucose tabs dont work reporting intake of a single glucose tab with no improvement in blood sugar; RD reviewed the rule of 15 with Pt today. Pt reports the lowest value on CGM of 58 mg/dL. Pt reports he selected a payday candy bar and felt this improved his symptoms of dizzy, nauseous and raised blood sugar.  Pt reports she did not meet his goal to eat out less. Patient reports he lives with his two aunts. Pt reports shared shopping and cooking and states his family relies on him to prepare meals. Pt reports family members also have dietary preferences. Pt reports he would like to move out into his own residence in the near future. Pt reports he is now eating out out 5-6 times weekly. Pt reports intake of 1-2 meals daily stating he always has dinner meal. All Pt's questions questions were answered during this encounter.   CGM Results from download:   % Time CGM active:   90%   (Goal >70%)  Average glucose:   135 mg/dL for 14 days  Glucose management indicator:   6.4 %  Time in range (70-180 mg/dL):   97 %   (Goal >29%)  Time High (181-250 mg/dL):   3 %   (Goal < 74%)  Time Very High (>250 mg/dL):    0 %   (Goal < 5%)  Time Low (54-69 mg/dL):   1 %   (Goal <5%)  Time Very Low (<54 mg/dL):   0 %   (Goal <8%)   There were no vitals taken for this visit. There is no height or weight on file to calculate BMI.    Diabetes Self-Management Education - 01/10/24 0829       Health Coping   How would you rate your overall health? Fair      Psychosocial Assessment   What is the  hardest part about your diabetes right now, causing you the most concern, or is the most worrisome to you about your diabetes?   Making healty food and beverage choices;Getting support / problem solving    Self-care barriers Other (comment)   time and support   Self-management support Doctor's office    Patient Concerns Nutrition/Meal planning    Special Needs None    Preferred Learning Style No preference indicated    Learning Readiness Contemplating      Pre-Education Assessment   Patient understands the diabetes disease and treatment process. Needs Review    Patient understands incorporating nutritional management into lifestyle. Needs Review    Patient undertands incorporating physical activity into lifestyle. Needs Review    Patient understands using medications safely. Comprehends key points    Patient understands monitoring blood glucose, interpreting and using results Needs Review    Patient understands prevention, detection, and treatment of acute complications. Needs Review    Patient understands prevention, detection, and treatment of chronic complications. Needs Review    Patient understands how to develop strategies to address psychosocial issues. Needs Review    Patient understands how to develop strategies to promote health/change behavior.  Needs Review      Complications   How often do you check your blood sugar? > 4 times/day    Number of hypoglycemic episodes per month 30    Can you tell when your blood sugar is low? Yes    What do you do if your blood sugar is low? dizzy, nausea, hot    Number of hyperglycemic episodes ( >200mg /dL): Never    Can you tell when your blood sugar is high? No    Have you had a dilated eye exam in the past 12 months? Yes    Have you had a dental exam in the past 12 months? Yes    Are you checking your feet? Yes    How many days per week are you checking your feet? 7      Dietary Intake   Breakfast ~6pm: [place], 4 slices of white bread,  cheese, mayo, water    Snack (morning) pay day candy bar    Dinner ~12am: taco bell [place] pizza, 2 soft tacos, [place]    Beverage(s) water, soda      Activity / Exercise   Activity / Exercise Type ADL's      Patient Education   Previous Diabetes Education Yes    Disease Pathophysiology Explored patient's options for treatment of their diabetes    Healthy Eating Role of diet in the treatment of diabetes and the relationship between the three main macronutrients and blood glucose level;Food label reading, portion sizes and measuring food.;Meal timing in regards to the patients' current diabetes medication.    Being Active Role of exercise on diabetes management, blood pressure control and cardiac health.    Medications Reviewed patients medication for diabetes, action, purpose, timing of dose and side effects.    Monitoring Daily foot exams;Yearly dilated eye exam;Identified appropriate SMBG and/or A1C goals.    Acute complications Taught prevention, symptoms, and  treatment of hypoglycemia - the 15 rule.    Chronic complications Dental care;Relationship between chronic complications and blood glucose control;Retinopathy and reason for yearly dilated eye exams    Diabetes Stress and Support Identified and addressed patients feelings and concerns about diabetes;Worked with patient to identify barriers to care and solutions;Role of stress on diabetes;Brainstormed with patient on coping mechanisms for social situations, getting support from significant others, dealing with feelings about diabetes    Lifestyle and Health Coping Lifestyle issues that need to be addressed for better diabetes care      Individualized Goals (developed by patient)   Nutrition Follow meal plan discussed    Medications take my medication as prescribed    Monitoring  Consistenly use CGM    Problem Solving Eating Pattern;Social situations;Addressing barriers to behavior change    Reducing Risk do foot checks  daily;examine blood glucose patterns    Health Coping Ask for help with psychological, social, or emotional issues      Patient Self-Evaluation of Goals - Patient rates self as meeting previously set goals (% of time)   Nutrition < 25% (hardly ever/never)    Physical Activity 25 - 50% (sometimes)    Medications >75% (most of the time)    Monitoring >75% (most of the time)    Problem Solving and behavior change strategies  < 25% (hardly ever/never)    Reducing Risk (treating acute and chronic complications) >75% (most of the time)    Health Coping 25 - 50% (sometimes)      Post-Education Assessment   Patient understands the  diabetes disease and treatment process. Comprehends key points    Patient understands incorporating nutritional management into lifestyle. Needs Review    Patient undertands incorporating physical activity into lifestyle. Needs Review    Patient understands using medications safely. Comphrehends key points    Patient understands monitoring blood glucose, interpreting and using results Needs Review    Patient understands prevention, detection, and treatment of acute complications. Needs Review    Patient understands prevention, detection, and treatment of chronic complications. Demonstrates understanding / competency    Patient understands how to develop strategies to address psychosocial issues. Needs Review    Patient understands how to develop strategies to promote health/change behavior. Needs Review      Outcomes   Program Status Not Completed      Subsequent Visit   Since your last visit have you had your blood pressure checked? Yes    Is your most recent blood pressure lower, unchanged, or higher since your last visit? Lower    Since your last visit have you experienced any weight changes? Loss    Weight Loss (lbs) 8    Since your last visit, are you checking your blood glucose at least once a day? Yes          Learning Objective:  Patient will have a  greater understanding of diabetes self-management. Patient education plan is to attend individual and/or group sessions per assessed needs and concerns.  Plan:   Patient Instructions  Continue to aim to eat out less 3 days weekly  Aim for avoiding skipping meals   Expected Outcomes:  Demonstrated interest in learning but significant barriers to change  Education material provided: ADA - How to Thrive: A Guide for Your Journey with Diabetes, Meal plan card, and Snack sheet  If problems or questions, patient to contact team via:  Phone  Future DSME appointment: - 3-4 months

## 2024-01-02 ENCOUNTER — Ambulatory Visit: Payer: MEDICAID | Admitting: Family Medicine

## 2024-01-08 ENCOUNTER — Ambulatory Visit: Payer: MEDICAID | Admitting: Family Medicine

## 2024-01-09 ENCOUNTER — Other Ambulatory Visit: Payer: Self-pay | Admitting: Pharmacy Technician

## 2024-01-09 ENCOUNTER — Other Ambulatory Visit: Payer: Self-pay

## 2024-01-09 NOTE — Progress Notes (Signed)
 Specialty Pharmacy Refill Coordination Note  Phillip Gill is a 48 y.o. male contacted today regarding refills of specialty medication(s) Bictegravir-Emtricitab-Tenofov (Biktarvy )   Patient requested Delivery   Delivery date: 01/10/24   Verified address: [address]  [place] [address]   Medication will be filled on 01/09/24.

## 2024-01-10 ENCOUNTER — Other Ambulatory Visit: Payer: MEDICAID

## 2024-01-10 ENCOUNTER — Encounter: Payer: MEDICAID | Attending: Family Medicine | Admitting: Dietician

## 2024-01-10 DIAGNOSIS — E1165 Type 2 diabetes mellitus with hyperglycemia: Secondary | ICD-10-CM | POA: Diagnosis present

## 2024-01-10 DIAGNOSIS — Z713 Dietary counseling and surveillance: Secondary | ICD-10-CM | POA: Diagnosis not present

## 2024-01-10 NOTE — Patient Instructions (Signed)
 Continue to aim to eat out less 3 days weekly  Aim for avoiding skipping meals

## 2024-01-13 ENCOUNTER — Other Ambulatory Visit: Payer: Self-pay

## 2024-01-13 ENCOUNTER — Other Ambulatory Visit: Payer: MEDICAID

## 2024-01-13 NOTE — Progress Notes (Signed)
   01/13/2024  Patient ID: Phillip Gill, male   DOB: 1975/08/12, 48 y.o.   MRN: [id number]  Subjective/Objective Telephone visit to follow-up on management of diabetes and hypertension   Diabetes  -Current medications:  Ozempic  2mg  weekly -Ozempic  recently increased to 2mg  weekly and patient states he is tolerating this dose well with no adverse side effects -Using [facility] for CGM, and data is linked to [employer]- last 14 days reflects the following: CGM active 93% of the time Average glucose 126 GMI 6.3% High 3%, Target 97%, Low 0% -A1c on 4/8 reflected value of 10.3% -Significantly decreased occurrences BG <60 since last visit.  Only 3 documented over the past 2 weeks, versus 20 when we spoke 4 weeks ago.  Patient has been having more consistent meals and snacks. -Patient does state he has small sore on one of his toes he believes may be from cutting toenails too short.  Does not endorse any redness, discharge, or foul smell.  States this has been present 2-3 days and does bleed sometimes if shoe/sock rubs up against it.   Hypertension -Current medications:  olmesartan  40mg , indapamide  2.5mg  daily -Indapamide  and olmesartan  increased at PCP visit on 6/24 -Patient has not checked home BP since medication doses were increased -Last OV BP 134/91 on 6/30 -Patient states headache he was experiencing has improved, so hopefully this is a reflection of improved BP control   Assessment/Plan   Diabetes -Continue Ozempic  2mg  weekly -Continue CGM with Freestyle Libre -Due for A1c    Hypertension -Continue current regimen  -Instructed patient to monitor and record home BP readings approximately 1 hour after taking BP medications; bring readings to upcoming visit with Dr. Sebastian -I recommend f/u CMP since olmesartan  and indapamide  were recently increased  Follow-up:  2 months   [name], PharmD, DPLA

## 2024-01-16 ENCOUNTER — Ambulatory Visit (INDEPENDENT_AMBULATORY_CARE_PROVIDER_SITE_OTHER): Payer: MEDICAID | Admitting: Licensed Clinical Social Worker

## 2024-01-16 ENCOUNTER — Other Ambulatory Visit: Payer: Self-pay | Admitting: Family Medicine

## 2024-01-16 ENCOUNTER — Other Ambulatory Visit (HOSPITAL_COMMUNITY): Payer: Self-pay

## 2024-01-16 ENCOUNTER — Other Ambulatory Visit: Payer: Self-pay

## 2024-01-16 DIAGNOSIS — F411 Generalized anxiety disorder: Secondary | ICD-10-CM

## 2024-01-16 DIAGNOSIS — F332 Major depressive disorder, recurrent severe without psychotic features: Secondary | ICD-10-CM | POA: Diagnosis not present

## 2024-01-16 DIAGNOSIS — E1165 Type 2 diabetes mellitus with hyperglycemia: Secondary | ICD-10-CM

## 2024-01-16 MED ORDER — BLOOD GLUCOSE MONITOR SYSTEM W/DEVICE KIT
1.0000 | PACK | Freq: Three times a day (TID) | 0 refills | Status: AC
  Filled 2024-01-16 – 2024-07-14 (×13): qty 1, 30d supply, fill #0

## 2024-01-16 NOTE — Progress Notes (Signed)
 THERAPIST PROGRESS NOTE   Session Date: 01/16/2024  Session Time: 1015 - 1108  Participation Level: Active  MSE/Presentation: Behavior: Appropriate and Sharing Speech: Normal Thought Process: Coherent Cognition: Alert and Appropriate Mood: Negative and Dysphoric Affect: Depressed and Flat Insight: Lacking Appearance: Casual  Type of Therapy: Individual Therapy  Treatment Goals addressed:   Progressing (5) LTG: Reduce frequency, intensity, and duration of depression symptoms so that daily functioning is improved (OP Depression) LTG: Increase coping skills to manage depression and improve ability to perform daily activities (OP Depression) LTG: To not be depressed, have more energy, and stop over eating (OP Depression) STG: Report a decrease in anxiety symptoms as evidenced by an overall reduction in anxiety score by a minimum of 25% on the Generalized Anxiety Disorder Scale (GAD-7) (Anxiety) STG: [place] Talcott will reduce frequency of avoidant behaviors by 50% as evidenced by self-report in therapy sessions (Anxiety)  Not Progressing (1)  Progress Towards Goals: Not Progressing  Interventions: CBT, Solution Focused, and Supportive  Summary: Bo is a 48 y.o. male with past psych history of MDD and GAD, presenting for follow up therapy session in efforts to improve management of depressive and anxious symptoms.  Patient actively engaged in session, presenting in overall pleasant moods, and congruent affect.  Patient actively engaged in introductory check-in, sharing of Not doing too great, been feeling sick at work last night, feeling nauseas, further sharing of believing blood sugar being too low, having stopped at [facility] on the way to appt, now finding sugar to be high, processing potential of having over-eaten and spiked sugar with breakfast sandwich, hash browns, lemonade, and caramel mocha. 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 Processing potential options and what pt desires to be different, repeatedly stating I don't know, proving reluctant to explore thoughts surrounding potential options to navigate challenges. Pt shared of feeling of getting to the point of where he may break, feeling stress to be overwhelming. Pt reflected on various options of securing greater income to aid in saving and working towards financial goals/plans.   Patient responded well to interventions. Patient continues to meet criteria for MDD and GAD. Patient will continue to benefit from engagement in outpatient therapy due to being the least restrictive service to meet presenting needs.      01/16/2024   10:19 AM 12/26/2023   10:20 AM 12/05/2023    9:18 AM 11/22/2023   10:38 AM  GAD 7 : Generalized Anxiety Score  Nervous, Anxious, on Edge 3 3 3 3   Control/stop worrying 3 3 3 3   Worry too much - different things 3 3 3 3   Trouble relaxing 0 1 0 2  Restless 1 3 1 2   Easily annoyed or irritable 3 3 3 3   Afraid - awful might happen 1 1 3 3   Total GAD 7 Score 14 17 16 19   Anxiety Difficulty Extremely difficult Extremely difficult Extremely difficult Extremely difficult      01/16/2024   10:53 AM 12/26/2023   10:21 AM 12/24/2023    8:41 AM 12/05/2023    9:19 AM 11/22/2023   10:36 AM  Depression screen PHQ 2/9  Decreased Interest 3 3 3 3 3   Down, Depressed, Hopeless 3 3 3  3  3  PHQ - 2 Score 6 6 6 6 6   Altered sleeping 3 3  1 3   Tired, decreased energy 3 3  1 3   Change in appetite 3 0  3 3  Feeling bad or failure about yourself  3 3  3 3   Trouble concentrating 3 3  3 3   Moving slowly or fidgety/restless 1 1  1 3   Suicidal thoughts 3 1  1 3   PHQ-9 Score 25 20  19 27   Difficult doing  work/chores Extremely dIfficult Extremely dIfficult  Extremely dIfficult Extremely dIfficult   Suicidal/Homicidal: Passive, No plan to harm self or others  Therapist Response: Clinician utilized CBT, solution focused, and supportive reflection interventions to support pt in navigation presenting sxs.   Clinician actively greeted pt upon presenting for today's visit, assessing presenting moods and affect, and engaging in introductory check-in. Prompted pt's recounts of recent events and factors contributing to presenting moods, actively listening to pt's recounts of recent stressors, factors contributing to presenting moods, and individual outlook pertaining to presenting stressors. Engaged pt in reassessing presenting depressive and anxious sxs via PHQ-9 and GAD-7, further engaging pt in processing recent continued increase in observed sxs and individual efforts at managing such sxs. Utilized open ended questions to further prompt pt's reflections of challenges surrounding work and home life that prove to be troubling pt, providing support and validation of expressed feelings. Utilized socratic questioning to prompt greater critical processing of shared thoughts and feelings surrounding stressors, utilizing MI and solution focused interventions to evoke pt's outlook surrounding abilities at navigating current challenges. Engaged pt in processing pSI, identifying means of keeping self safe and supports whom pt can utilize.  Clinician reassessed presentation of sxs and presence of safety concerns. Clinician provided support and empathy to patient during session.  Plan: Return again in 4 weeks.  Diagnosis:  Encounter Diagnoses  Name Primary?   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  Patient/Guardian was advised Release of Information must be obtained  prior to any record release in order to collaborate their care with an outside provider. Patient/Guardian was advised if they have not already done so to contact the registration department to sign all necessary forms in order for us  to release information regarding their care.   Consent: Patient/Guardian gives verbal consent for treatment and assignment of benefits for services provided during this visit. Patient/Guardian expressed understanding and agreed to proceed.   Lynwood JONETTA Maris, MSW, LCSW 01/16/2024,  11:04 AM

## 2024-01-22 ENCOUNTER — Ambulatory Visit: Payer: MEDICAID | Admitting: Family Medicine

## 2024-01-26 ENCOUNTER — Emergency Department (HOSPITAL_COMMUNITY): Payer: MEDICAID

## 2024-01-26 ENCOUNTER — Other Ambulatory Visit: Payer: Self-pay

## 2024-01-26 ENCOUNTER — Encounter (HOSPITAL_COMMUNITY): Payer: Self-pay | Admitting: Emergency Medicine

## 2024-01-26 ENCOUNTER — Emergency Department (HOSPITAL_COMMUNITY)
Admission: EM | Admit: 2024-01-26 | Discharge: 2024-01-27 | Disposition: A | Discharge status: Home / Self Care | Payer: MEDICAID

## 2024-01-26 DIAGNOSIS — R0789 Other chest pain: Secondary | ICD-10-CM | POA: Diagnosis present

## 2024-01-26 DIAGNOSIS — Z79899 Other long term (current) drug therapy: Secondary | ICD-10-CM | POA: Diagnosis not present

## 2024-01-26 DIAGNOSIS — I10 Essential (primary) hypertension: Secondary | ICD-10-CM | POA: Diagnosis not present

## 2024-01-26 DIAGNOSIS — Z21 Asymptomatic human immunodeficiency virus [HIV] infection status: Secondary | ICD-10-CM | POA: Insufficient documentation

## 2024-01-26 DIAGNOSIS — R944 Abnormal results of kidney function studies: Secondary | ICD-10-CM | POA: Diagnosis not present

## 2024-01-26 DIAGNOSIS — D72829 Elevated white blood cell count, unspecified: Secondary | ICD-10-CM | POA: Diagnosis not present

## 2024-01-26 DIAGNOSIS — R079 Chest pain, unspecified: Secondary | ICD-10-CM

## 2024-01-26 LAB — BASIC METABOLIC PANEL WITH GFR
Anion gap: 8 (ref 5–15)
BUN: 15 mg/dL (ref 6–20)
CO2: 27 mmol/L (ref 22–32)
Calcium: 9.1 mg/dL (ref 8.9–10.3)
Chloride: 103 mmol/L (ref 98–111)
Creatinine, Ser: 1.32 mg/dL — ABNORMAL HIGH (ref 0.61–1.24)
GFR, Estimated: >60 mL/min (ref >60)
Glucose, Bld: 107 mg/dL — ABNORMAL HIGH (ref 70–99)
Potassium: 3.4 mmol/L — ABNORMAL LOW (ref 3.5–5.1)
Sodium: 138 mmol/L (ref 135–145)

## 2024-01-26 LAB — TROPONIN I (HIGH SENSITIVITY): Troponin I (High Sensitivity): 5 ng/L (ref <18)

## 2024-01-26 LAB — CBC
HCT: 42.3 % (ref 39.0–52.0)
Hemoglobin: 14 g/dL (ref 13.0–17.0)
MCH: 29.3 pg (ref 26.0–34.0)
MCHC: 33.1 g/dL (ref 30.0–36.0)
MCV: 88.5 fL (ref 80.0–100.0)
Platelets: 245 K/uL (ref 150–400)
RBC: 4.78 MIL/uL (ref 4.22–5.81)
RDW: 13.5 % (ref 11.5–15.5)
WBC: 12.4 K/uL — ABNORMAL HIGH (ref 4.0–10.5)
nRBC: 0 % (ref 0.0–0.2)

## 2024-01-26 NOTE — ED Triage Notes (Signed)
 Pt BIB EMS from home work with c/o chest pain and panic attack. VSS with EMS.   324 asa given with ems

## 2024-01-26 NOTE — ED Provider Notes (Signed)
 [place] EMERGENCY DEPARTMENT AT [facility] Provider Note   CSN: [id number] Arrival date & time: 01/26/24  2118     Patient presents with: Panic Attack and Chest Pain   Phillip Gill is a 48 y.o. male with history of anxiety, HIV, fibromyalgia, hypertension presents with complaints of panic attack with associated chest pain and shortness of breath.  Pain was mid sternum that radiated to the left side of his chest.  Lasted for approximately 30 minutes.  Was constant, nonexertional.  Has no cardiac history or prior blood clots.  Dates that this felt very typical to his panic attacks, however his work called EMS and he was encouraged to come to the ER.    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 Efrain- sees him every 4 months   Hypertension    IBS (irritable bowel syndrome)    Migraine    none in years (02/16/2015   Overweight 07/20/2015   Renal insufficiency 12/29/2013       Prior to Admission medications   Medication Sig Start Date End Date Taking? Authorizing Provider  ARIPiprazole  (ABILIFY ) 5 MG tablet Take 1 tablet (5 mg total) by mouth daily. 12/02/23   Lynnette Barter, MD  bictegravir-emtricitabine -tenofovir  AF (BIKTARVY ) 50-200-25 MG TABS tablet TAKE 1 TABLET BY MOUTH EVERY DAY 11/21/23   [name], [name], FNP  Blood Glucose Monitoring Suppl (BLOOD GLUCOSE MONITOR SYSTEM) w/Device KIT Use to check blood sugar in the morning, at noon, and at bedtime. 01/16/24   Sebastian Beverley NOVAK, MD  buPROPion  (WELLBUTRIN  XL) 150 MG 24 hr tablet Take 1 tablet (150 mg total) by mouth daily. 12/02/23 03/03/24  Lynnette Barter, MD  Continuous Glucose Sensor (FREESTYLE LIBRE 3 SENSOR) MISC Place 1 sensor on the skin every 14 days. Use to check  glucose continuously 10/22/23   Sebastian Beverley NOVAK, MD  diclofenac  Sodium (VOLTAREN ) 1 % GEL Apply 4 grams topically 4 (four) times daily to affected joint. 12/19/22   Joane Artist RAMAN, MD  Elastic Bandages & Supports (CARPAL TUNNEL WRIST STABILIZER) MISC 1 each by Does not apply route as needed. 09/19/23   Sebastian Beverley NOVAK, MD  Elastic Bandages & Supports (MEDICAL COMPRESSION STOCKINGS) MISC 1 each by Does not apply route daily. 09/19/23 09/13/24  Sebastian Beverley NOVAK, MD  escitalopram  (LEXAPRO ) 20 MG tablet Take 1 tablet (20 mg total) by mouth daily. 12/02/23   Lynnette Barter, MD  esomeprazole  (NEXIUM ) 40 MG capsule Take 1 capsule (40 mg total) by mouth 2 (two) times daily before a meal. 06/20/23   Abran Norleen SAILOR, MD  finasteride  (PROSCAR ) 5 MG tablet Take 1 tablet (5 mg total) by mouth daily. 12/24/23   Sebastian Beverley NOVAK, MD  indapamide  (LOZOL ) 2.5 MG tablet Take 1 tablet (2.5 mg total) by mouth daily. 12/25/23 03/24/24  Sebastian Beverley NOVAK, MD  olmesartan  (BENICAR ) 40 MG tablet Take 1 tablet (40 mg total) by mouth daily. 12/25/23 03/24/24  Sebastian Beverley NOVAK, MD  rosuvastatin  (CRESTOR ) 10 MG tablet Take 1 tablet (10 mg total) by mouth daily. 11/21/23   [name], [name], FNP  Semaglutide , 2 MG/DOSE, 8 MG/3ML SOPN  Inject 2 mg as directed once a week. 12/26/23 03/25/24  Sebastian Beverley NOVAK, MD  sildenafil  (VIAGRA ) 100 MG tablet Take 0.5-1 tablets (50-100 mg total) by mouth daily as needed for erectile dysfunction. Do not exceed 100 mg in a day 11/21/23   [name], [name], FNP  Vitamin D , Ergocalciferol , (DRISDOL ) 1.25 MG (50000 UNIT) CAPS capsule Take 1 capsule (50,000 Units total) by mouth every 7 (seven) days. 12/24/23   Sebastian Beverley NOVAK, MD    Allergies: Bactrim [sulfamethoxazole w/trimethoprim (co-trimoxazole)], Sulfa antibiotics, Cymbalta  [duloxetine  hcl], Duloxetine , Metformin  and related, Other, Pork-derived products, Zoloft [sertraline hcl], Clindamycin /lincomycin, and Neurontin  [gabapentin ]    Review of Systems   Cardiovascular:  Positive for chest pain.    Updated Vital Signs BP (!) 143/87   Pulse 75   Temp 98.8 F (37.1 C)   Resp 15   Ht 6' (1.829 m)   Wt 106.6 kg   SpO2 92%   BMI 31.87 kg/m   Physical Exam Vitals and nursing note reviewed.  Constitutional:      General: He is not in acute distress.    Appearance: He is well-developed.  HENT:     Head: Normocephalic and atraumatic.  Eyes:     Conjunctiva/sclera: Conjunctivae normal.  Cardiovascular:     Rate and Rhythm: Normal rate and regular rhythm.     Heart sounds: No murmur heard. Pulmonary:     Effort: Pulmonary effort is normal. No respiratory distress.     Breath sounds: Normal breath sounds.  Chest:     Chest wall: Tenderness present.  Abdominal:     Palpations: Abdomen is soft.     Tenderness: There is no abdominal tenderness.  Musculoskeletal:        General: No swelling.     Cervical back: Neck supple.  Skin:    General: Skin is warm and dry.     Capillary Refill: Capillary refill takes less than 2 seconds.  Neurological:     Mental Status: He is alert.  Psychiatric:        Mood and Affect: Mood normal.     (all labs ordered are listed, but only abnormal results are displayed) Labs Reviewed  BASIC METABOLIC PANEL WITH GFR - Abnormal; Notable for the following components:      Result Value   Potassium 3.4 (*)    Glucose, Bld 107 (*)    Creatinine, Ser 1.32 (*)    All other components within normal limits  CBC - Abnormal; Notable for the following components:   WBC 12.4 (*)    All other components within normal limits  TROPONIN I (HIGH SENSITIVITY)  TROPONIN I (HIGH SENSITIVITY)    EKG: EKG Interpretation Date/Time:  Sunday January 26 2024 21:53:58 EDT Ventricular Rate:  83 PR Interval:  206 QRS Duration:  87 QT Interval:  350 QTC Calculation: 412 R Axis:   58  Text Interpretation: Sinus rhythm Borderline prolonged PR interval Anterior infarct, old Borderline T abnormalities, inferior leads  No acute changes when compared to prior EKG from 02/21/2022 Confirmed by Gennaro Bouchard ([id number]) on 01/26/2024 10:31:21 PM  Radiology: DG Chest 2 View Result Date: 01/26/2024 CLINICAL DATA:  Chest pain.  Panic attack. EXAM: CHEST - 2 VIEW COMPARISON:  10/20/2022 FINDINGS: The cardiac silhouette, mediastinal and hilar contours are normal. The lungs are clear. No pleural effusions or pneumothorax. No pulmonary lesions. The bony thorax is intact. IMPRESSION: No acute cardiopulmonary findings. Electronically Signed   By: MYRTIS Stammer M.D.   On:  01/26/2024 21:57     Procedures   Medications Ordered in the ED - No data to display                                  Medical Decision Making Amount and/or Complexity of Data Reviewed Labs: ordered. Radiology: ordered.   This patient presents to the ED with chief complaint(s) of chest pain.  The complaint involves an extensive differential diagnosis and also carries with it a high risk of complications and morbidity.   Pertinent past medical history as listed in HPI  The differential diagnosis includes  ACS, PE, aortic dissection, pneumonia, panic attack Additional history obtained: Records reviewed Care Everywhere/External Records  Assessment and management:   Dynamically stable, nontoxic-appearing patient presenting with complaints of panic attack with associated chest pain and shortness of breath that started earlier today lasted for approximately 30 minutes.  Felt typical for his panic attacks.  Has reproducible chest wall tenderness on exam.  His lungs are clear.  He has no cardiac history or prior blood clots.  He is PERC negative.  Independent ECG interpretation:  Sinus rhythm, T wave abnormalities, unchanged  Independent labs interpretation:  The following labs were independently interpreted:  CBC with leukocytosis of 12.4 elevated creatinine, troponin without elevation, second troponin pending  Independent visualization and  interpretation of imaging: I independently visualized the following imaging with scope of interpretation limited to determining acute life threatening conditions related to emergency care:  Chest x-ray no acute cardiopulmonary disease   Consultations obtained:   none at this time  Disposition:   Signout to Dr. Midge.  Please see his note for remainder of the visit.  Disposition pending workup.  Social Determinants of Health:   none  This note was dictated with voice recognition software.  Despite best efforts at proofreading, errors may have occurred which can change the documentation meaning.       Final diagnoses:  Chest pain, unspecified type    ED Discharge Orders     None          Donnajean Lynwood VEAR DEVONNA 01/26/24 2329    Midge Golas, MD 01/27/24 0111

## 2024-01-27 ENCOUNTER — Other Ambulatory Visit: Payer: Self-pay

## 2024-01-27 ENCOUNTER — Other Ambulatory Visit (HOSPITAL_COMMUNITY): Payer: Self-pay

## 2024-01-27 ENCOUNTER — Other Ambulatory Visit: Payer: Self-pay | Admitting: Family Medicine

## 2024-01-27 DIAGNOSIS — E559 Vitamin D deficiency, unspecified: Secondary | ICD-10-CM

## 2024-01-27 LAB — TROPONIN I (HIGH SENSITIVITY): Troponin I (High Sensitivity): 5 ng/L (ref <18)

## 2024-01-27 NOTE — Discharge Instructions (Signed)

## 2024-01-27 NOTE — ED Notes (Signed)
 Pt O2 drops when he is sleeping due to apnea. Pt states he uses c-pap at home.

## 2024-01-28 ENCOUNTER — Other Ambulatory Visit: Payer: Self-pay

## 2024-01-28 MED ORDER — VITAMIN D (ERGOCALCIFEROL) 1.25 MG (50000 UNIT) PO CAPS
50000.0000 [IU] | ORAL_CAPSULE | ORAL | 0 refills | Status: DC
  Filled 2024-01-28 – 2024-02-07 (×2): qty 8, 56d supply, fill #0

## 2024-01-29 ENCOUNTER — Other Ambulatory Visit (HOSPITAL_COMMUNITY): Payer: Self-pay

## 2024-01-30 ENCOUNTER — Telehealth: Payer: Self-pay

## 2024-01-30 ENCOUNTER — Other Ambulatory Visit: Payer: Self-pay

## 2024-01-30 NOTE — Progress Notes (Signed)
 [facility] [address] [place]. [place] [address] [phone]       OFFICE FOLLOW UP NOTE  [name] Date of Birth:  1976-05-13 Medical Record Number:  [id number]    Primary neurologist: Dr. Buck Reason for visit: CPAP follow-up    SUBJECTIVE:   CHIEF COMPLAINT:  Chief Complaint  Patient presents with   Obstructive Sleep Apnea    Rm 3 alone  Pt is well, reports he hasn't been using cpap as much due to having to get up quickly to use the restroom and/or vomit due to Ozempic . No other concerns.     Follow-up visit:  Prior visit: 08/06/2023  Brief HPI:   Phillip Gill is a 48 y.o. male who is followed for OSA on CPAP.  Initial sleep study 05/2022 indicated severe obstructive sleep apnea with a total AHI of 83.9/hour and O2 nadir of 72% with significant time below or at 88% saturation of over 90 minutes, indicating nocturnal hypoxemia.  He was started on AutoPap therapy 06/2022 but returned machine and eventually restarted treatment in the fall 2024.  At prior visit, noted continued elevated residual AHI and recommended pressure adjustment from 7-15 to 8-17.    Interval history:  Patient returns for follow up visit. Has not been using CPAP consistently over the past month due to nausea and frequent vomiting due to Ozempic  after dosage increase back in June. He has f/u visit with PCP this week and plans on discussing further.  For unclear reason, pressure setting adjustments never completed after prior visit and apnea remains uncontrolled. Notes occasional bloating/chest pressure sensation upon awakening in the morning, currently using FFM. He is on Nexium  for GERD. Continues to experience excessive daytime sleepiness, ESS 23/24.           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 Efrain- sees him every 4 months   Hypertension    IBS (irritable bowel syndrome)    Migraine    none in years (02/16/2015   Overweight 07/20/2015   Renal insufficiency 12/29/2013    PSH:  Past Surgical History:  Procedure Laterality Date   CO2 LASER APPLICATION N/A 02/11/2013   Procedure: CO2 LASER APPLICATION;  Surgeon: Bernarda Ned, MD;  Location: [facility];  Service: General;  Laterality: N/A;   FOOT SURGERY     HIGH RESOLUTION ANOSCOPY N/A 02/11/2013   Procedure: HIGH RESOLUTION ANOSCOPY WITH BIOPSY, LASER ABLATION;  Surgeon: Bernarda Ned, MD;  Location: [facility];  Service: General;  Laterality: N/A;   WISDOM TOOTH EXTRACTION      Social History:  Social History   Socioeconomic History   Marital status: Single    Spouse name: Not on file   Number of children: Not on file   Years of education: Not on file   Highest education level: Some college, no degree  Occupational History   Not on file  Tobacco Use   Smoking status: Former    Current packs/day: 0.00    Average  packs/day: 1 pack/day for 18.0 years (18.0 ttl pk-yrs)    Types: Cigarettes    Start date: 07/03/2003    Quit date: 07/02/2021    Years since quitting: 2.5    Passive exposure: Never   Smokeless tobacco: Former  Building services engineer status: Former   Quit date: 12/01/2022   Substances: Nicotine   Substance and Sexual Activity   Alcohol use: Yes    Comment: occasional use   Drug use: No   Sexual activity: Not on file    Comment: accepted condoms  Other Topics Concern   Not on file  Social History Narrative   Grew up in [place], now living in [place],-  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    Finished HS.   Doing online classes with [employer]- studying accounting    Previously 2 years of classes at [employer].    Has Partner- Phillip Gill- together for 1 year.    Has 2 girls- (born in 2000).          Social Drivers of Health   Financial Resource Strain: Medium Risk (02/02/2024)   Overall Financial Resource Strain (CARDIA)    Difficulty of Paying Living Expenses: Somewhat hard  Food Insecurity: Food Insecurity Present (02/02/2024)   Hunger Vital Sign    Worried About Running Out of Food in the Last Year: Sometimes true    Ran Out of Food in the Last Year: Sometimes true  Transportation Needs: No Transportation Needs (02/02/2024)   PRAPARE - Administrator, Civil Service (Medical): No    Lack of Transportation (Non-Medical): No  Physical Activity: Inactive (02/02/2024)   Exercise Vital Sign    Days of Exercise per Week: 0 days    Minutes of Exercise per Session: Not on file  Stress: Stress Concern Present (02/02/2024)   [employer] of Occupational Health - Occupational Stress Questionnaire    Feeling of Stress: Rather much  Social Connections: Moderately Integrated (02/02/2024)   Social Connection and Isolation Panel    Frequency of Communication with Friends and Family: Three times a week    Frequency of Social Gatherings with Friends and Family: Three times a week    Attends Religious Services: More than 4 times per year    Active Member of Clubs or Organizations: Yes    Attends Banker Meetings: More than 4 times per year    Marital Status: Never married  Intimate Partner Violence: Not At Risk (10/12/2023)   Humiliation, Afraid, Rape, and Kick questionnaire    Fear of Current or Ex-Partner: No    Emotionally Abused: No    Physically Abused: No    Sexually Abused: No    Family History:  Family History  Problem Relation Age of Onset   Hypertension Mother    Diabetes Mother    Stroke Mother        cerbral aneurysm   Pancreatic cancer Mother    Colon cancer Father    Hypertension Brother    Sleep apnea  Cousin    Mental illness Neg Hx    Stomach cancer Neg Hx    Esophageal cancer Neg Hx     Medications:   Current Outpatient Medications on File Prior to Visit  Medication Sig Dispense Refill   ARIPiprazole  (ABILIFY ) 5 MG tablet Take 1 tablet (5 mg total) by mouth daily. 90 tablet 0   bictegravir-emtricitabine -tenofovir  AF (BIKTARVY ) 50-200-25 MG TABS tablet TAKE 1 TABLET BY MOUTH EVERY DAY 30 tablet 5  Blood Glucose Monitoring Suppl (BLOOD GLUCOSE MONITOR SYSTEM) w/Device KIT Use to check blood sugar in the morning, at noon, and at bedtime. 1 kit 0   buPROPion  (WELLBUTRIN  XL) 150 MG 24 hr tablet Take 1 tablet (150 mg total) by mouth daily. 90 tablet 0   Continuous Glucose Sensor (FREESTYLE LIBRE 3 SENSOR) MISC Place 1 sensor on the skin every 14 days. Use to check glucose continuously 2 each 4   diclofenac  Sodium (VOLTAREN ) 1 % GEL Apply 4 grams topically 4 (four) times daily to affected joint. 100 g 11   Elastic Bandages & Supports (CARPAL TUNNEL WRIST STABILIZER) MISC 1 each by Does not apply route as needed. 1 each 0   Elastic Bandages & Supports (MEDICAL COMPRESSION STOCKINGS) MISC 1 each by Does not apply route daily. 1 each 0   escitalopram  (LEXAPRO ) 20 MG tablet Take 1 tablet (20 mg total) by mouth daily. 90 tablet 0   esomeprazole  (NEXIUM ) 40 MG capsule Take 1 capsule (40 mg total) by mouth 2 (two) times daily before a meal. 180 capsule 3   finasteride  (PROSCAR ) 5 MG tablet Take 1 tablet (5 mg total) by mouth daily. 30 tablet 1   indapamide  (LOZOL ) 2.5 MG tablet Take 1 tablet (2.5 mg total) by mouth daily. 90 tablet 0   olmesartan  (BENICAR ) 40 MG tablet Take 1 tablet (40 mg total) by mouth daily. 90 tablet 0   rosuvastatin  (CRESTOR ) 10 MG tablet Take 1 tablet (10 mg total) by mouth daily. 30 tablet 2   Semaglutide , 2 MG/DOSE, 8 MG/3ML SOPN Inject 2 mg as directed once a week. 9 mL 0   sildenafil  (VIAGRA ) 100 MG tablet Take 0.5-1 tablets (50-100 mg total) by mouth daily as needed for  erectile dysfunction. Do not exceed 100 mg in a day 30 tablet 1   Vitamin D , Ergocalciferol , (DRISDOL ) 1.25 MG (50000 UNIT) CAPS capsule Take 1 capsule (50,000 Units total) by mouth every 7 (seven) days. 8 capsule 0   No current facility-administered medications on file prior to visit.    Allergies:   Allergies  Allergen Reactions   Bactrim [Sulfamethoxazole W/Trimethoprim (Co-Trimoxazole)] Hives and Shortness Of Breath   Sulfa Antibiotics Hives and Shortness Of Breath   Cymbalta  [Duloxetine  Hcl] Diarrhea   Duloxetine  Diarrhea   Metformin  And Related Nausea And Vomiting   Other Hives and Swelling    Colgate toothpaste    Pork-Derived Products Other (See Comments)    Pt refused to eat pork products.   Zoloft [Sertraline Hcl] Diarrhea   Clindamycin /Lincomycin Rash   Neurontin  [Gabapentin ] Rash      OBJECTIVE:  Physical Exam  Vitals:   02/03/24 1241  BP: (!) 133/96  Pulse: 84  Weight: 245 lb (111.1 kg)  Height: 6' (1.829 m)   Body mass index is 33.23 kg/m. No results found.   General: well developed, well nourished, pleasant middle-age male, seated, in no evident distress Head: head normocephalic and atraumatic.   Neck: supple with no carotid or supraclavicular bruits Cardiovascular: regular rate and rhythm, no murmurs  Neurologic Exam Mental Status: Awake and fully alert. Oriented to place and time. Recent and remote memory intact. Attention span, concentration and fund of knowledge appropriate. Mood and affect appropriate.  Cranial Nerves: Pupils equal, briskly reactive to light. Extraocular movements full without nystagmus. Visual fields full to confrontation. Hearing intact. Facial sensation intact. Face, tongue, palate moves normally and symmetrically.  Motor: Normal bulk and tone. Normal strength in all tested extremity muscles Gait and  Station: Arises from chair without difficulty. Stance is normal. Gait demonstrates normal stride length and balance without use  of AD.         ASSESSMENT/PLAN: Phillip Gill is a 48 y.o. year old male    OSA on CPAP :  Compliance report shows suboptimal usage with residual AHI 21.2 Advised to f/u with PCP to discuss N/V since Ozempic  dosage increase which has been causing difficulty using CPAP consistently  Adjust pressure setting from 7-15 to 8-17 as previously recommended.  Adjustments made via resmed Request completion of ONO on CPAP to ensure resolution of nocturnal hypoxemia on CPAP therapy Will request mask refitting to see if this helps with aerophagia. Also advised to f/u with PCP to discuss other tx options for GERD as this may also be contributing Did discuss importance of nightly compliance for further management of apnea with ensuring greater than 4 hours per night for optimal benefit Will repeat download in 2 months, if compliant and apnea uncontrolled, will consider titration study with possible need of BiPAP Continue to follow with DME company for any needed supplies or CPAP related concerns CPAP set up 04/2023     Follow up in 1 year or call earlier if needed   CC:  PCP: Sebastian Beverley NOVAK, MD    I personally spent a total of 30 minutes in the care of the patient today including preparing to see the patient, performing a medically appropriate exam/evaluation, counseling and educating, placing orders, and documenting clinical information in the EHR.  Harlene Bogaert, AGNP-BC  [facility] [address] [place], [place] [address]  Phone [phone] Fax [phone] Note: This document was prepared with digital dictation and possible smart phrase technology. Any transcriptional errors that result from this process are unintentional.

## 2024-01-30 NOTE — Transitions of Care (Post Inpatient/ED Visit) (Signed)
   01/30/2024  Name: Phillip Gill MRN: [id number] DOB: [birth year]  Today's TOC FU Call Status: Today's TOC FU Call Status:: Unsuccessful Call (1st Attempt) Unsuccessful Call (1st Attempt) Date: 01/30/24  Attempted to reach the patient regarding the most recent Inpatient/ED visit.  Follow Up Plan: Additional outreach attempts will be made to reach the patient to complete the Transitions of Care (Post Inpatient/ED visit) call.  Signature  Ulanda Prose, RMA

## 2024-02-03 ENCOUNTER — Ambulatory Visit (INDEPENDENT_AMBULATORY_CARE_PROVIDER_SITE_OTHER): Payer: MEDICAID | Admitting: Adult Health

## 2024-02-03 ENCOUNTER — Ambulatory Visit: Payer: MEDICAID | Admitting: Adult Health

## 2024-02-03 ENCOUNTER — Encounter: Payer: Self-pay | Admitting: Adult Health

## 2024-02-03 VITALS — BP 133/96 | HR 84 | Ht 72.0 in | Wt 245.0 lb

## 2024-02-03 DIAGNOSIS — G4733 Obstructive sleep apnea (adult) (pediatric): Secondary | ICD-10-CM

## 2024-02-03 DIAGNOSIS — F458 Other somatoform disorders: Secondary | ICD-10-CM

## 2024-02-03 DIAGNOSIS — G4734 Idiopathic sleep related nonobstructive alveolar hypoventilation: Secondary | ICD-10-CM

## 2024-02-03 NOTE — Patient Instructions (Addendum)
 Ensure nightly use of CPAP for optimal benefit   Will adjust pressure settings for better apnea control - I will repeat a download in 2 months to reassess   Will request change of mask type to see if that helps with bloating/chest pressure sensation   You will be contacted to complete an overnight oximetry test to monitor oxygen levels while on CPAP - If you do not hear anything about this test in the next, please contact [facility] to get set up       Follow up in 1 year or call earlier if needed

## 2024-02-03 NOTE — Progress Notes (Signed)
 Janith Nielson D, CMA  Joylene Bradley; Garcia, Patricia; Ziegler, Melissa; Tucker, Dolanda; Cain, Arbyrd New orders have been placed for the above pt, DOB: May 17, 1976 Thanks

## 2024-02-04 ENCOUNTER — Other Ambulatory Visit: Payer: Self-pay

## 2024-02-04 NOTE — Progress Notes (Signed)
 Specialty Pharmacy Ongoing Clinical Assessment Note  Phillip Gill is a 48 y.o. male who is being followed by the specialty pharmacy service for RxSp HIV   Patient's specialty medication(s) reviewed today: Bictegravir-Emtricitab-Tenofov (Biktarvy )   Missed doses in the last 4 weeks: 0   Patient/Caregiver did not have any additional questions or concerns.   Therapeutic benefit summary: Patient is achieving benefit   Adverse events/side effects summary: No adverse events/side effects   Patient's therapy is appropriate to: Continue    Goals Addressed             This Visit's Progress    Achieve Undetectable HIV Viral Load < 20   Improving    Patient is not on track and improving. Patient will maintain adherence. Patient's viral load as of 11/08/23 was 53 copies/mL.          Follow up: 3 months  Silvano LOISE Dolly Specialty Pharmacist

## 2024-02-04 NOTE — Progress Notes (Signed)
 Specialty Pharmacy Refill Coordination Note  Phillip Gill is a 48 y.o. male contacted today regarding refills of specialty medication(s) Bictegravir-Emtricitab-Tenofov (Biktarvy )   Patient requested Delivery   Delivery date: 02/07/24   Verified address: [address]  [place] [address]   Medication will be filled on 02/06/24.

## 2024-02-05 ENCOUNTER — Other Ambulatory Visit: Payer: Self-pay

## 2024-02-06 ENCOUNTER — Ambulatory Visit: Payer: MEDICAID | Admitting: Family Medicine

## 2024-02-06 ENCOUNTER — Other Ambulatory Visit: Payer: Self-pay

## 2024-02-06 ENCOUNTER — Encounter: Payer: Self-pay | Admitting: Family Medicine

## 2024-02-06 ENCOUNTER — Other Ambulatory Visit (HOSPITAL_COMMUNITY): Payer: Self-pay

## 2024-02-06 VITALS — BP 150/96 | HR 80 | Temp 97.1°F | Resp 18 | Wt 246.8 lb

## 2024-02-06 DIAGNOSIS — R112 Nausea with vomiting, unspecified: Secondary | ICD-10-CM | POA: Diagnosis not present

## 2024-02-06 DIAGNOSIS — T50905A Adverse effect of unspecified drugs, medicaments and biological substances, initial encounter: Secondary | ICD-10-CM | POA: Diagnosis not present

## 2024-02-06 DIAGNOSIS — I10 Essential (primary) hypertension: Secondary | ICD-10-CM | POA: Diagnosis not present

## 2024-02-06 DIAGNOSIS — K2100 Gastro-esophageal reflux disease with esophagitis, without bleeding: Secondary | ICD-10-CM

## 2024-02-06 DIAGNOSIS — K529 Noninfective gastroenteritis and colitis, unspecified: Secondary | ICD-10-CM

## 2024-02-06 DIAGNOSIS — E1165 Type 2 diabetes mellitus with hyperglycemia: Secondary | ICD-10-CM

## 2024-02-06 DIAGNOSIS — Z7985 Long-term (current) use of injectable non-insulin antidiabetic drugs: Secondary | ICD-10-CM

## 2024-02-06 HISTORY — DX: Nausea with vomiting, unspecified: T50.905A

## 2024-02-06 LAB — POC COVID19 BINAXNOW: SARS Coronavirus 2 Ag: NEGATIVE

## 2024-02-06 MED ORDER — HYOSCYAMINE SULFATE 0.125 MG PO TABS
0.1250 mg | ORAL_TABLET | ORAL | 0 refills | Status: DC | PRN
  Filled 2024-02-06: qty 30, 5d supply, fill #0

## 2024-02-06 MED ORDER — FAMOTIDINE 40 MG PO TABS
40.0000 mg | ORAL_TABLET | Freq: Every day | ORAL | 0 refills | Status: DC
  Filled 2024-02-06: qty 60, 60d supply, fill #0

## 2024-02-06 MED ORDER — METOCLOPRAMIDE HCL 5 MG PO TABS
5.0000 mg | ORAL_TABLET | Freq: Four times a day (QID) | ORAL | 0 refills | Status: DC | PRN
  Filled 2024-02-06: qty 30, 8d supply, fill #0

## 2024-02-06 MED ORDER — SIMETHICONE 180 MG PO CAPS
1.0000 | ORAL_CAPSULE | Freq: Three times a day (TID) | ORAL | 0 refills | Status: DC | PRN
Start: 2024-02-06 — End: 2024-02-18
  Filled 2024-02-06: qty 30, 10d supply, fill #0

## 2024-02-06 NOTE — Progress Notes (Signed)
 Psychiatric Follow Up Adult Assessment  Patient Identification: Phillip Gill MRN:  [id number] Date of Evaluation:  02/06/2024 Referral Source: Beverley Hummer, MD  Assessment:  Phillip Gill is a 48 y.o. male with a history of MDD, GAD, panic attacks, HIV on HAART, OSA on CPAP, chronic hepatitis B, anal condyloma, GERD, hypertension who presents in person to [facility] for medication management. He was iniitally seen by me on 05/04/23 and diagnosed with MDD and GAD with panic attacks.  Patient reports recent worsening anxiety and elevated BP. He feels current psychotropics are helpful but has noticed more nausea with increased dosage of semaglutide . We will change bupropion  to SR formulation and decrease to 100 mg. If there is continued difficulty managing blood pressure, may need to consider discontinuation of bupropion  and consider alternative augmentation strategies or cross titration from Lexapro . Patient to follow up with me in 1 months.   Plan:  # Major Depressive Disorder, recurrent episode Past medication trials:  Status of problem: active Interventions: -- Continue psychotherapy  -- Continue lexapro  20 milligrams daily -- Continue abilify  5 milligrams daily  -10/2023 A1c 10.3, 08/01/23 LDL cholesterol 130  - ECG 01/2022 Qtc 413 --Change bupropion  to SR 100 mg daily  -No history of seizures  # Generalized anxiety disorder with panic attack Past medication trials:  Status of problem: Active Interventions: -- Lexapro  as above -- Abilify  as above --hold propranolol   Vitamin D  Deficiency -vitamin d  50,000 units weekly for 8 weeks -repeat vitamin D  level at follow up  Return to care in 1 month  Patient was given contact information for behavioral health clinic and was instructed to call 911 for emergencies.    Patient and plan of care will be discussed with the Attending MD, Dr. Mercy, who agrees with the above statement and plan.    Subjective:  Chief Complaint: Medication Management  Interval history Patient presents for follow-up.  Denies SI/HI/AVH.  Reports depression is overall stable but endorses worsening anxiety recently with several panic attacks in the past few weeks.  He endorses struggling with situational stressors specifically at work that he has difficulties addressing at times.  Reports sleep has been fair and appetite has been appropriate given current use of semaglutide .  He endorses some symptoms of nausea that he associates with having semaglutide  with psychotropics.  Past Psychiatric History:  Diagnoses: depression and anxiety Medication trials: Lexapro , elavil  (worsening pain), xanax , bupropion , celexa , valium , duloxetine  (GI distress), hydroxyzine , propranolol , risperidone, ambien , gabapentin  (hives) Previous psychiatrist/therapist: [facility], Dr. Barbaraann (decade ago), therapist in the past  Hospitalizations: denies Suicide attempts: denies SIB: denies Hx of violence towards others: denies Current access to guns: denies Hx of trauma/abuse: denies  Substance Abuse History in the last 12 months:  No.  Past Medical History:  Past Medical History:  Diagnosis Date   Anxiety    Arthritis    neck (02/16/2015)   Chronic hepatitis B (HCC)    SECONDARY TO HIV   Chronic lower back pain    Depression    Fibromyalgia    Genital warts    HIV disease (HCC) 02/28/2015   HIV infection (HCC)    followed by Dr. Efrain- sees him every 4 months   Hypertension    IBS (irritable bowel syndrome)    Migraine    none in years (02/16/2015   Overweight 07/20/2015   Renal insufficiency 12/29/2013    Past Surgical History:  Procedure Laterality Date   CO2 LASER APPLICATION  N/A 02/11/2013   Procedure: CO2 LASER APPLICATION;  Surgeon: Bernarda Ned, MD;  Location: [facility];  Service: General;  Laterality: N/A;   FOOT SURGERY     HIGH RESOLUTION ANOSCOPY N/A 02/11/2013    Procedure: HIGH RESOLUTION ANOSCOPY WITH BIOPSY, LASER ABLATION;  Surgeon: Bernarda Ned, MD;  Location: [facility];  Service: General;  Laterality: N/A;   WISDOM TOOTH EXTRACTION      Family Psychiatric History:  denies  Family History:  Family History  Problem Relation Age of Onset   Hypertension Mother    Diabetes Mother    Stroke Mother        cerbral aneurysm   Pancreatic cancer Mother    Colon cancer Father    Hypertension Brother    Sleep apnea Cousin    Mental illness Neg Hx    Stomach cancer Neg Hx    Esophageal cancer Neg Hx     Social History:   Academic/Vocational: Unemployed Social History   Socioeconomic History   Marital status: Single    Spouse name: Not on file   Number of children: Not on file   Years of education: Not on file   Highest education level: Some college, no degree  Occupational History   Not on file  Tobacco Use   Smoking status: Former    Current packs/day: 0.00    Average packs/day: 1 pack/day for 18.0 years (18.0 ttl pk-yrs)    Types: Cigarettes    Start date: 07/03/2003    Quit date: 07/02/2021    Years since quitting: 2.6    Passive exposure: Never   Smokeless tobacco: Former  Advertising account planner   Vaping status: Former   Quit date: 12/01/2022   Substances: Nicotine   Substance and Sexual Activity   Alcohol use: Yes    Comment: occasional use   Drug use: No   Sexual activity: Not on file    Comment: accepted condoms  Other Topics Concern   Not on file  Social History Narrative   Grew up in [place], now living in [place],-  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    Finished HS.   Doing online classes with [employer]- studying accounting   Previously 2 years of classes at [employer].    Has Partner- [name]- together for 1 year.    Has 2 girls- (born in 2000).          Social Drivers of Health   Financial Resource Strain: Medium Risk (02/02/2024)   Overall Financial Resource Strain (CARDIA)     Difficulty of Paying Living Expenses: Somewhat hard  Food Insecurity: Food Insecurity Present (02/02/2024)   Hunger Vital Sign    Worried About Running Out of Food in the Last Year: Sometimes true    Ran Out of Food in the Last Year: Sometimes true  Transportation Needs: No Transportation Needs (02/02/2024)   PRAPARE - Administrator, Civil Service (Medical): No    Lack of Transportation (Non-Medical): No  Physical Activity: Inactive (02/02/2024)   Exercise Vital Sign    Days of Exercise per Week: 0 days    Minutes of Exercise per Session: Not on file  Stress: Stress Concern Present (02/02/2024)   [employer] of Occupational Health - Occupational Stress Questionnaire    Feeling of Stress: Rather much  Social Connections: Moderately Integrated (02/02/2024)   Social Connection and Isolation Panel    Frequency of Communication with Friends and Family: Three times a week  Frequency of Social Gatherings with Friends and Family: Three times a week    Attends Religious Services: More than 4 times per year    Active Member of Clubs or Organizations: Yes    Attends Banker Meetings: More than 4 times per year    Marital Status: Never married    Additional Social History: updated  Allergies:   Allergies  Allergen Reactions   Bactrim [Sulfamethoxazole W/Trimethoprim (Co-Trimoxazole)] Hives and Shortness Of Breath   Sulfa Antibiotics Hives and Shortness Of Breath   Cymbalta  [Duloxetine  Hcl] Diarrhea   Duloxetine  Diarrhea   Metformin  And Related Nausea And Vomiting   Other Hives and Swelling    [employer] toothpaste    Pork-Derived Products Other (See Comments)    Pt refused to eat pork products.   Zoloft [Sertraline Hcl] Diarrhea   Clindamycin /Lincomycin Rash   Neurontin  [Gabapentin ] Rash    Current Medications: Current Outpatient Medications  Medication Sig Dispense Refill   ARIPiprazole  (ABILIFY ) 5 MG tablet Take 1 tablet (5 mg total) by mouth daily. 90 tablet  0   bictegravir-emtricitabine -tenofovir  AF (BIKTARVY ) 50-200-25 MG TABS tablet TAKE 1 TABLET BY MOUTH EVERY DAY 30 tablet 5   Blood Glucose Monitoring Suppl (BLOOD GLUCOSE MONITOR SYSTEM) w/Device KIT Use to check blood sugar in the morning, at noon, and at bedtime. 1 kit 0   buPROPion  (WELLBUTRIN  XL) 150 MG 24 hr tablet Take 1 tablet (150 mg total) by mouth daily. 90 tablet 0   Continuous Glucose Sensor (FREESTYLE LIBRE 3 SENSOR) MISC Place 1 sensor on the skin every 14 days. Use to check glucose continuously 2 each 4   diclofenac  Sodium (VOLTAREN ) 1 % GEL Apply 4 grams topically 4 (four) times daily to affected joint. 100 g 11   Elastic Bandages & Supports (CARPAL TUNNEL WRIST STABILIZER) MISC 1 each by Does not apply route as needed. 1 each 0   Elastic Bandages & Supports (MEDICAL COMPRESSION STOCKINGS) MISC 1 each by Does not apply route daily. 1 each 0   escitalopram  (LEXAPRO ) 20 MG tablet Take 1 tablet (20 mg total) by mouth daily. 90 tablet 0   esomeprazole  (NEXIUM ) 40 MG capsule Take 1 capsule (40 mg total) by mouth 2 (two) times daily before a meal. 180 capsule 3   famotidine  (PEPCID ) 40 MG tablet Take 1 tablet (40 mg total) by mouth at bedtime. 60 tablet 0   finasteride  (PROSCAR ) 5 MG tablet Take 1 tablet (5 mg total) by mouth daily. 30 tablet 1   hyoscyamine  (LEVSIN ) 0.125 MG tablet Take 1 tablet (0.125 mg total) by mouth every 4 (four) hours as needed for cramping. 30 tablet 0   indapamide  (LOZOL ) 2.5 MG tablet Take 1 tablet (2.5 mg total) by mouth daily. 90 tablet 0   metoCLOPramide  (REGLAN ) 5 MG tablet Take 1 tablet (5 mg total) by mouth every 6 (six) hours as needed for nausea. 30 tablet 0   olmesartan  (BENICAR ) 40 MG tablet Take 1 tablet (40 mg total) by mouth daily. 90 tablet 0   rosuvastatin  (CRESTOR ) 10 MG tablet Take 1 tablet (10 mg total) by mouth daily. 30 tablet 2   Semaglutide , 2 MG/DOSE, 8 MG/3ML SOPN Inject 2 mg as directed once a week. 9 mL 0   sildenafil  (VIAGRA ) 100 MG  tablet Take 0.5-1 tablets (50-100 mg total) by mouth daily as needed for erectile dysfunction. Do not exceed 100 mg in a day 30 tablet 1   Simethicone  180 MG CAPS Take 1 capsule (180  mg total) by mouth 3 (three) times daily with meals as needed (bloating). 30 capsule 0   Vitamin D , Ergocalciferol , (DRISDOL ) 1.25 MG (50000 UNIT) CAPS capsule Take 1 capsule (50,000 Units total) by mouth every 7 (seven) days. 8 capsule 0   No current facility-administered medications for this visit.    ROS: Review of Systems   Objective:  Psychiatric Specialty Exam: There were no vitals taken for this visit.There is no height or weight on file to calculate BMI.  General Appearance: Casual  Eye Contact:  Fair  Speech:  Clear and Coherent and Normal Rate  Volume:  Decreased  Mood:  Anxious  Affect:  Appropriate and Congruent  Thought Content: Logical   Suicidal Thoughts:  No  Homicidal Thoughts:  No  Thought Process:  Coherent, Goal Directed, and Linear  Orientation:  Full (Time, Place, and Person)    Memory: Recent;   Fair  Judgment:  Intact  Insight:  Fair  Concentration:  Attention Span: Fair  Recall:  not formally assessed   Fund of Knowledge: Fair  Language: Fair  Psychomotor Activity:  Normal  Akathisia:  No  AIMS (if indicated): not done  Assets:  Manufacturing systems engineer Desire for Improvement Financial Resources/Insurance Housing Social Support Transportation  ADL's:  Intact  Cognition: WNL  Sleep:  Good   PE: General: well-appearing; no acute distress  Pulm: no increased work of breathing on room air  Strength & Muscle Tone: within normal limits Neuro: no focal neurological deficits observed  Gait & Station: normal  Metabolic Disorder Labs: Lab Results  Component Value Date   HGBA1C 10.3 (A) 10/08/2023   MPG 111 10/02/2014   No results found for: PROLACTIN Lab Results  Component Value Date   CHOL 199 08/01/2023   TRIG 158 (H) 08/01/2023   HDL 42 08/01/2023   CHOLHDL  4.7 08/01/2023   VLDL 16 02/14/2015   LDLCALC 130 (H) 08/01/2023   LDLCALC 91 10/23/2022   Lab Results  Component Value Date   TSH 1.39 09/19/2023    Therapeutic Level Labs: No results found for: LITHIUM No results found for: CBMZ No results found for: VALPROATE  Screenings:  GAD-7    Flowsheet Row Counselor from 01/16/2024 in [facility] at [facility] from 12/26/2023 in [facility] at [place] Counselor from 12/05/2023 in [facility] at [facility] Visit from 11/22/2023 in [facility] at [employer] Visit from 11/21/2023 in [facility] - A Dept Of [facility]  Total GAD-7 Score 14 17 16 19 19    PHQ2-9    Flowsheet Row Counselor from 01/16/2024 in [facility] at [facility] from 12/26/2023 in [facility] at [facility] Visit from 12/24/2023 in [facility] at [employer] from 12/05/2023 in [facility] at [facility] Visit from 11/22/2023 in [facility] at [facility] Total Score 6 6 6 6 6   PHQ-9 Total Score 25 20 -- 19 27   Flowsheet Row ED from 01/26/2024 in [facility] at [facility] Counselor from 12/05/2023 in [facility] at [facility] ED to Hosp-Admission (Discharged) from 10/11/2023 in [facility]  SURGICAL  C-SSRS RISK CATEGORY No Risk Low Risk No Risk    Collaboration of Care: Collaboration of Care:   Patient/Guardian was advised  Release of Information must be obtained prior to any record release in order to collaborate their care with an outside provider. Patient/Guardian was advised if they have not already done so to contact the registration department to  sign all necessary forms in order for us  to release information regarding their care.   Consent: Patient/Guardian gives verbal consent for treatment and assignment of benefits for services provided during this visit. Patient/Guardian expressed understanding and agreed to proceed.   Prentice Espy, MD 8/7/20254:57 PM

## 2024-02-06 NOTE — Progress Notes (Signed)
 Assessment & Plan   Assessment/Plan:    Assessment & Plan Medication-induced gastrointestinal symptoms Nausea and vomiting likely related to recent changes in psychiatric medications, possibly aripiprazole  or bupropion . Symptoms include nausea, abdominal bloating, and cramping. Negative COVID test. Differential includes GI medication effect and gastritis due to GERD. - Recommend fluid intake to prevent dehydration. - Prescribe famotidine  40 mg at night for chest discomfort and bloating. - Prescribe hyoscyamine  0.125 mg every four hours as needed for abdominal cramping. - Prescribe metoclopramide  5 mg four times a day as needed for nausea. - Recommend over-the-counter simethicone  (Gas-X) one tablet three times a day as needed after meals and before bedtime. - Advise follow-up with psychiatry to evaluate psychiatric medications.  Gastroesophageal reflux disease with esophagitis GERD with esophagitis contributing to chest discomfort and bloating. Currently on high-dose Nexium  twice a day. - Continue Nexium  as prescribed. - Add famotidine  40 mg at night for additional symptom control.  Hypertension Blood pressure elevated at 154/111 mmHg. Recent medication adjustments include increased doses of olmesartan  and indapamide . Current symptoms may affect blood pressure readings. - Re-evaluate blood pressure management after gastrointestinal symptoms improve.  Type 2 diabetes mellitus Blood glucose level at 107 mg/dL in the emergency department. Recent increase in Ozempic  dosage. Weight has increased contrary to expectations with Ozempic .  Anxiety, depression, and bipolar disorder Recent changes in psychiatric medications may be contributing to gastrointestinal symptoms. Follow-up with psychiatry is planned. - Follow up with psychiatry to evaluate current medication regimen and address anxiety symptoms.      There are no discontinued medications.  Return in about 1 week (around 02/13/2024)  for BP.        Subjective:   Encounter date: 02/06/2024  Phillip Gill is a 48 y.o. male who has Anxiety; Other constipation; Anxiety and depression; Fibromyalgia muscle pain; Anal condyloma; Gynecomastia; HTN (hypertension); Chronic hepatitis B (HCC); LGV (lymphogranuloma venereum); Primary syphilis; Gastroenteritis; HIV (human immunodeficiency virus infection) (HCC); Tobacco abuse; Overweight; Chronic pain associated with significant psychosocial dysfunction; Gonorrhea; Human immunodeficiency virus (HIV) disease (HCC); Foot lesion; Hepatitis B surface antigen positive; Healthcare maintenance; Erectile dysfunction; Cervical pain; Human monkeypox; Encounter for physical examination related to employment; Snoring; Urinary frequency; Left foot pain; Poor social situation; MDD (major depressive disorder); Passive suicidal ideations; OSA on CPAP; MDD (major depressive disorder), recurrent severe, without psychosis (HCC); Gastroesophageal reflux disease; Neck mass; Nocturia; Carpal tunnel syndrome on both sides; Hyponatremia; AKI (acute kidney injury) (HCC); Type 2 diabetes mellitus with hyperglycemia (HCC); 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   He  has a past medical history of Anxiety, Arthritis, Chronic hepatitis B (HCC), Chronic lower back pain, Depression, Fibromyalgia, Genital warts, HIV disease (HCC) (02/28/2015), HIV infection (HCC), Hypertension, IBS (irritable bowel syndrome), Migraine, Overweight (07/20/2015), and Renal insufficiency (12/29/2013).[name]   He presents with chief complaint of Hospitalization Follow-up (ED f/u chest pain. Pt stated chest pain is still present (sharp pain). //HM due- Hep B vaccine ) and Medication Reaction (Rx side effects with nausea, abdominal pain and  vomiting after taking Aripiprazole  or Wellbutrin . ) .   Discussed the use of AI scribe software for clinical note transcription with the patient, who gave verbal consent to proceed.  History of Present Illness Phillip Gill is a 48 year old male with diabetes who presents for a diabetic follow-up with complaints of abdominal pain,  nausea, and vomiting.  He has been experiencing abdominal pain, nausea, and vomiting for the past few days, particularly after taking his medications. He associates these symptoms with his psychiatric medications, including aripiprazole  and bupropion , although he is unsure which specific medication is causing the issue. The symptoms were absent on Monday, Tuesday, and Wednesday but returned after taking his medication this morning.  He has a history of anxiety, depression, and bipolar disorder, and follows up with psychiatry. He has experienced chest pain, which was evaluated in the emergency department and attributed to a panic episode. During that visit, troponins were negative, EKG showed no signs of ischemic disease, and a CBC revealed a slightly elevated white blood cell count of 12.8 K. His metabolic panel showed a glucose level of 107, potassium at 3.4, and GFR greater than 90. He feels extra anxious recently and has had several anxiety episodes.  His current medications include Ozempic , which was recently increased to 2 mg, and he reports experiencing gastrointestinal discomfort since the increase. He also takes olmesartan  40 mg and indapamide  2.5 mg, which were increased at his last visit. He is noncompliant with his medication regimen, often forgetting to take his medications regularly. He takes Nexium  twice a day for heartburn and experiences bloating and chest discomfort, which he associates with heartburn. He feels bloated and has abdominal cramping.  During the review of symptoms, he reports feeling bloated. He confirms experiencing nausea and  abdominal cramping, particularly after taking his medications. His weight has increased by 10-11 pounds since his last emergency department visit, which is contrary to the expected weight loss with Ozempic .     ROS  Past Surgical History:  Procedure Laterality Date   CO2 LASER APPLICATION N/A 02/11/2013   Procedure: CO2 LASER APPLICATION;  Surgeon: Bernarda Ned, MD;  Location: [facility];  Service: General;  Laterality: N/A;   FOOT SURGERY     HIGH RESOLUTION ANOSCOPY N/A 02/11/2013   Procedure: HIGH RESOLUTION ANOSCOPY WITH BIOPSY, LASER ABLATION;  Surgeon: Bernarda Ned, MD;  Location: [facility];  Service: General;  Laterality: N/A;   WISDOM TOOTH EXTRACTION      Outpatient Medications Prior to Visit  Medication Sig Dispense Refill   ARIPiprazole  (ABILIFY ) 5 MG tablet Take 1 tablet (5 mg total) by mouth daily. 90 tablet 0   bictegravir-emtricitabine -tenofovir  AF (BIKTARVY ) 50-200-25 MG TABS tablet TAKE 1 TABLET BY MOUTH EVERY DAY 30 tablet 5   Blood Glucose Monitoring Suppl (BLOOD GLUCOSE MONITOR SYSTEM) w/Device KIT Use to check blood sugar in the morning, at noon, and at bedtime. 1 kit 0   buPROPion  (WELLBUTRIN  XL) 150 MG 24 hr tablet Take 1 tablet (150 mg total) by mouth daily. 90 tablet 0   Continuous Glucose Sensor (FREESTYLE LIBRE 3 SENSOR) MISC Place 1 sensor on the skin every 14 days. Use to check glucose continuously 2 each 4   diclofenac  Sodium (VOLTAREN ) 1 % GEL Apply 4 grams topically 4 (four) times daily to affected joint. 100 g 11   Elastic Bandages & Supports (CARPAL TUNNEL WRIST STABILIZER) MISC 1 each by Does not apply route as needed. 1 each 0   Elastic Bandages & Supports (MEDICAL COMPRESSION STOCKINGS) MISC 1 each by Does not apply route daily. 1 each 0   escitalopram  (LEXAPRO ) 20 MG tablet Take 1 tablet (20 mg total) by mouth daily. 90 tablet 0   esomeprazole  (NEXIUM ) 40 MG capsule Take 1 capsule (40 mg total) by mouth 2 (two) times  daily  before a meal. 180 capsule 3   finasteride  (PROSCAR ) 5 MG tablet Take 1 tablet (5 mg total) by mouth daily. 30 tablet 1   indapamide  (LOZOL ) 2.5 MG tablet Take 1 tablet (2.5 mg total) by mouth daily. 90 tablet 0   olmesartan  (BENICAR ) 40 MG tablet Take 1 tablet (40 mg total) by mouth daily. 90 tablet 0   rosuvastatin  (CRESTOR ) 10 MG tablet Take 1 tablet (10 mg total) by mouth daily. 30 tablet 2   Semaglutide , 2 MG/DOSE, 8 MG/3ML SOPN Inject 2 mg as directed once a week. 9 mL 0   sildenafil  (VIAGRA ) 100 MG tablet Take 0.5-1 tablets (50-100 mg total) by mouth daily as needed for erectile dysfunction. Do not exceed 100 mg in a day 30 tablet 1   Vitamin D , Ergocalciferol , (DRISDOL ) 1.25 MG (50000 UNIT) CAPS capsule Take 1 capsule (50,000 Units total) by mouth every 7 (seven) days. 8 capsule 0   No facility-administered medications prior to visit.    Family History  Problem Relation Age of Onset   Hypertension Mother    Diabetes Mother    Stroke Mother        cerbral aneurysm   Pancreatic cancer Mother    Colon cancer Father    Hypertension Brother    Sleep apnea Cousin    Mental illness Neg Hx    Stomach cancer Neg Hx    Esophageal cancer Neg Hx     Social History   Socioeconomic History   Marital status: Single    Spouse name: Not on file   Number of children: Not on file   Years of education: Not on file   Highest education level: Some college, no degree  Occupational History   Not on file  Tobacco Use   Smoking status: Former    Current packs/day: 0.00    Average packs/day: 1 pack/day for 18.0 years (18.0 ttl pk-yrs)    Types: Cigarettes    Start date: 07/03/2003    Quit date: 07/02/2021    Years since quitting: 2.6    Passive exposure: Never   Smokeless tobacco: Former  Advertising account planner   Vaping status: Former   Quit date: 12/01/2022   Substances: Nicotine   Substance and Sexual Activity   Alcohol use: Yes    Comment: occasional use   Drug use: No   Sexual activity:  Not on file    Comment: accepted condoms  Other Topics Concern   Not on file  Social History Narrative   Grew up in [place], now living in [place],-  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    Finished HS.   Doing online classes with [employer]- studying accounting   Previously 2 years of classes at [employer].    Has Partner- [name]- together for 1 year.    Has 2 girls- (born in 2000).          Social Drivers of Health   Financial Resource Strain: Medium Risk (02/02/2024)   Overall Financial Resource Strain (CARDIA)    Difficulty of Paying Living Expenses: Somewhat hard  Food Insecurity: Food Insecurity Present (02/02/2024)   Hunger Vital Sign    Worried About Running Out of Food in the Last Year: Sometimes true    Ran Out of Food in the Last Year: Sometimes true  Transportation Needs: No Transportation Needs (02/02/2024)   PRAPARE - Administrator, Civil Service (Medical): No    Lack of Transportation (Non-Medical): No  Physical Activity: Inactive (  02/02/2024)   Exercise Vital Sign    Days of Exercise per Week: 0 days    Minutes of Exercise per Session: Not on file  Stress: Stress Concern Present (02/02/2024)   [employer] of Occupational Health - Occupational Stress Questionnaire    Feeling of Stress: Rather much  Social Connections: Moderately Integrated (02/02/2024)   Social Connection and Isolation Panel    Frequency of Communication with Friends and Family: Three times a week    Frequency of Social Gatherings with Friends and Family: Three times a week    Attends Religious Services: More than 4 times per year    Active Member of Clubs or Organizations: Yes    Attends Banker Meetings: More than 4 times per year    Marital Status: Never married  Intimate Partner Violence: Not At Risk (10/12/2023)   Humiliation, Afraid, Rape, and Kick questionnaire    Fear of Current or Ex-Partner: No    Emotionally Abused: No    Physically  Abused: No    Sexually Abused: No                                                                                                  Objective:  Physical Exam: BP (!) 150/96 (BP Location: Left Arm, Patient Position: Sitting, Cuff Size: Large) Comment: recheck after resting  Pulse 80   Temp (!) 97.1 F (36.2 C) (Temporal)   Resp 18   Wt 246 lb 12.8 oz (111.9 kg)   SpO2 98%   BMI 33.47 kg/m   Wt Readings from Last 3 Encounters:  02/06/24 246 lb 12.8 oz (111.9 kg)  02/03/24 245 lb (111.1 kg)  01/26/24 235 lb (106.6 kg)    Physical Exam VITALS: BP- 154/111 GENERAL: Alert, cooperative, well developed, no acute distress. HEENT: Normocephalic, normal oropharynx, moist mucous membranes. CHEST: Clear to auscultation bilaterally, no wheezes, rhonchi, or crackles. CARDIOVASCULAR: Normal heart rate and rhythm, S1 and S2 normal without murmurs. ABDOMEN: Soft, non-tender, non-distended, without organomegaly, normal bowel sounds. EXTREMITIES: No cyanosis or edema. NEUROLOGICAL: Cranial nerves grossly intact, moves all extremities without gross motor or sensory deficit.   Physical Exam  DG Chest 2 View Result Date: 01/26/2024 CLINICAL DATA:  Chest pain.  Panic attack. EXAM: CHEST - 2 VIEW COMPARISON:  10/20/2022 FINDINGS: The cardiac silhouette, mediastinal and hilar contours are normal. The lungs are clear. No pleural effusions or pneumothorax. No pulmonary lesions. The bony thorax is intact. IMPRESSION: No acute cardiopulmonary findings. Electronically Signed   By: MYRTIS Stammer M.D.   On: 01/26/2024 21:57    Recent Results (from the past 2160 hours)  T-helper cells (CD4) count (not at [facility])     Status: None   Collection Time: 11/08/23 10:43 AM  Result Value Ref Range   CD4 T Helper % 33 30 - 61 %   Absolute CD4 1,116 490 - 1,740 cells/uL   Total lymphocyte count 3,403 850 - 3,900 cells/uL    Comment: The analytical performance characteristics of this assay  have been determined by  [employer]. The  modifications have not been cleared  or approved by the  FDA. This assay has been validated pursuant to the CLIA  regulations and is used for clinical purposes.   Hepatitis B surface antibody,qualitative     Status: None   Collection Time: 11/08/23 10:43 AM  Result Value Ref Range   Hep B S Ab NON-REACTIVE NON-REACTIVE  Hepatitis B DNA, ultraquantitative, PCR     Status: None   Collection Time: 11/08/23 10:43 AM  Result Value Ref Range   Hepatitis B DNA Not Detected IU/mL   Hepatitis B virus DNA Not Detected Log IU/mL    Comment: . Reference Range:                          Not Detected       IU/mL                          Not Detected   Log IU/mL . The analytical performance characteristics of this assay have been determined by [employer] [employer]. The modifications have not been cleared or approved by the U.S. Food and Drug Administration. This assay has been validated pursuant to the CLIA regulations and is used for clinical purposes. .   Hepatitis B surface antigen     Status: None   Collection Time: 11/08/23 10:43 AM  Result Value Ref Range   Hepatitis B Surface Ag NON-REACTIVE NON-REACTIVE    Comment: . For additional information, please refer to  http://education.[facility].com/faq/FAQ202  (This link is being provided for informational/ educational purposes only.) .   HIV-1 RNA quant-no reflex-bld     Status: Abnormal   Collection Time: 11/08/23 10:43 AM  Result Value Ref Range   HIV 1 RNA Quant 53 (H) NOT DETECTED copies/mL   HIV-1 RNA Quant, Log 1.72 (H) NOT DETECTED Log copies/mL    Comment: . This test was performed using Real-Time Polymerase Chain Reaction. . Reportable Range: 20 copies/mL to 10,000,000 copies/mL (1.30 log copies/mL to 7.00 log copies/mL).   Basic Metabolic Panel (BMET)     Status: Abnormal   Collection Time: 11/29/23  8:51 AM  Result Value Ref Range   Sodium 139 135 - 145 mEq/L   Potassium 3.7 3.5 - 5.1  mEq/L   Chloride 104 96 - 112 mEq/L   CO2 25 19 - 32 mEq/L   Glucose, Bld 111 (H) 70 - 99 mg/dL   BUN 16 6 - 23 mg/dL   Creatinine, Ser 8.77 0.40 - 1.50 mg/dL   GFR 29.34 >39.99 mL/min    Comment: Calculated using the CKD-EPI Creatinine Equation (2021)   Calcium  9.3 8.4 - 10.5 mg/dL  Basic metabolic panel     Status: Abnormal   Collection Time: 01/26/24  9:50 PM  Result Value Ref Range   Sodium 138 135 - 145 mmol/L   Potassium 3.4 (L) 3.5 - 5.1 mmol/L   Chloride 103 98 - 111 mmol/L   CO2 27 22 - 32 mmol/L   Glucose, Bld 107 (H) 70 - 99 mg/dL    Comment: Glucose reference range applies only to samples taken after fasting for at least 8 hours.   BUN 15 6 - 20 mg/dL   Creatinine, Ser 8.67 (H) 0.61 - 1.24 mg/dL   Calcium  9.1 8.9 - 10.3 mg/dL   GFR, Estimated >39 >39 mL/min    Comment: (NOTE) Calculated using the CKD-EPI Creatinine Equation (2021)    Anion gap 8 5 - 15  Comment: Performed at [facility], [address] [place], [place] [address]  CBC     Status: Abnormal   Collection Time: 01/26/24  9:50 PM  Result Value Ref Range   WBC 12.4 (H) 4.0 - 10.5 K/uL   RBC 4.78 4.22 - 5.81 MIL/uL   Hemoglobin 14.0 13.0 - 17.0 g/dL   HCT 57.6 60.9 - 47.9 %   MCV 88.5 80.0 - 100.0 fL   MCH 29.3 26.0 - 34.0 pg   MCHC 33.1 30.0 - 36.0 g/dL   RDW 86.4 88.4 - 84.4 %   Platelets 245 150 - 400 K/uL   nRBC 0.0 0.0 - 0.2 %    Comment: Performed at [facility], [address], [place], [place] [address]  Troponin I (High Sensitivity)     Status: None   Collection Time: 01/26/24  9:50 PM  Result Value Ref Range   Troponin I (High Sensitivity) 5 <18 ng/L    Comment: (NOTE) Elevated high sensitivity troponin I (hsTnI) values and significant  changes across serial measurements may suggest ACS but many other  chronic and acute conditions are known to elevate hsTnI results.  Refer to the Links section for chest pain algorithms and additional  guidance. Performed at [facility],  [address], [place], [place] [address]   Troponin I (High Sensitivity)     Status: None   Collection Time: 01/27/24 12:07 AM  Result Value Ref Range   Troponin I (High Sensitivity) 5 <18 ng/L    Comment: (NOTE) Elevated high sensitivity troponin I (hsTnI) values and significant  changes across serial measurements may suggest ACS but many other  chronic and acute conditions are known to elevate hsTnI results.  Refer to the Links section for chest pain algorithms and additional  guidance. Performed at [facility], [address], [place], [place] [address]   POC COVID-19 BinaxNow     Status: Normal   Collection Time: 02/06/24  9:25 AM  Result Value Ref Range   SARS Coronavirus 2 Ag Negative Negative        Beverley Adine Hummer, MD, MS

## 2024-02-06 NOTE — Patient Instructions (Addendum)
  VISIT SUMMARY: During your visit today, we discussed your recent abdominal pain, nausea, and vomiting, which you believe may be related to your psychiatric medications. We also reviewed your history of anxiety, depression, and bipolar disorder, as well as your recent emergency department visit for chest pain. Your current medications and their potential side effects were evaluated, and we discussed your recent weight gain and gastrointestinal discomfort since increasing your Ozempic  dosage.  YOUR PLAN: -MEDICATION-INDUCED GASTROINTESTINAL SYMPTOMS: Your nausea and vomiting are likely related to recent changes in your psychiatric medications, possibly aripiprazole  or bupropion . To help manage these symptoms, I recommend increasing your fluid intake to prevent dehydration. I have prescribed famotidine  40 mg at night for chest discomfort and bloating, hyoscyamine  0.125 mg every four hours as needed for abdominal cramping, and metoclopramide  5 mg four times a day as needed for nausea. Additionally, you can take over-the-counter simethicone  (Gas-X) one tablet three times a day as needed after meals and before bedtime. Please follow up with your psychiatrist to evaluate your psychiatric medications.  -GASTROESOPHAGEAL REFLUX DISEASE WITH ESOPHAGITIS: GERD with esophagitis is causing your chest discomfort and bloating. Continue taking Nexium  as prescribed, and I have added famotidine  40 mg at night for additional symptom control.  -HYPERTENSION: Your blood pressure was elevated at 154/111 mmHg today. We recently increased your doses of olmesartan  and indapamide . We will re-evaluate your blood pressure management after your gastrointestinal symptoms improve.  -TYPE 2 DIABETES MELLITUS: Your blood glucose level was 107 mg/dL in the emergency department. We recently increased your Ozempic  dosage, which has caused some gastrointestinal discomfort and unexpected weight gain. We will continue to monitor your  condition.  -ANXIETY, DEPRESSION, AND BIPOLAR DISORDER: Your recent changes in psychiatric medications may be contributing to your gastrointestinal symptoms. Please follow up with your psychiatrist to evaluate your current medication regimen and address your anxiety symptoms.  INSTRUCTIONS: Please follow up with your psychiatrist to evaluate your psychiatric medications and address your anxiety symptoms. Continue taking your medications as prescribed and monitor your symptoms. If your gastrointestinal symptoms persist or worsen, please contact our office. We will re-evaluate your blood pressure management after your gastrointestinal symptoms improve.

## 2024-02-07 ENCOUNTER — Inpatient Hospital Stay: Payer: MEDICAID | Admitting: Family Medicine

## 2024-02-07 ENCOUNTER — Ambulatory Visit (HOSPITAL_COMMUNITY): Payer: MEDICAID | Admitting: Student

## 2024-02-07 ENCOUNTER — Other Ambulatory Visit: Payer: Self-pay | Admitting: Family

## 2024-02-07 ENCOUNTER — Other Ambulatory Visit: Payer: Self-pay

## 2024-02-07 ENCOUNTER — Other Ambulatory Visit (HOSPITAL_COMMUNITY): Payer: Self-pay

## 2024-02-07 DIAGNOSIS — F332 Major depressive disorder, recurrent severe without psychotic features: Secondary | ICD-10-CM | POA: Diagnosis not present

## 2024-02-07 DIAGNOSIS — F330 Major depressive disorder, recurrent, mild: Secondary | ICD-10-CM

## 2024-02-07 MED ORDER — BUPROPION HCL ER (SR) 100 MG PO TB12
100.0000 mg | ORAL_TABLET | Freq: Every day | ORAL | 0 refills | Status: DC
  Filled 2024-02-07: qty 30, 30d supply, fill #0

## 2024-02-07 MED ORDER — ARIPIPRAZOLE 5 MG PO TABS
5.0000 mg | ORAL_TABLET | Freq: Every day | ORAL | 0 refills | Status: DC
  Filled 2024-02-07 – 2024-02-10 (×2): qty 90, 90d supply, fill #0

## 2024-02-07 MED ORDER — ROSUVASTATIN CALCIUM 10 MG PO TABS
10.0000 mg | ORAL_TABLET | Freq: Every day | ORAL | 5 refills | Status: AC
  Filled 2024-02-07 – 2024-03-16 (×2): qty 30, 30d supply, fill #0
  Filled 2024-03-27 – 2024-05-19 (×5): qty 30, 30d supply, fill #1
  Filled 2024-06-02 – 2024-06-16 (×2): qty 30, 30d supply, fill #2
  Filled 2024-06-22 – 2024-07-14 (×2): qty 30, 30d supply, fill #3

## 2024-02-07 MED ORDER — ESCITALOPRAM OXALATE 20 MG PO TABS
20.0000 mg | ORAL_TABLET | Freq: Every day | ORAL | 0 refills | Status: DC
  Filled 2024-02-07 – 2024-03-16 (×2): qty 90, 90d supply, fill #0

## 2024-02-10 ENCOUNTER — Other Ambulatory Visit (HOSPITAL_COMMUNITY): Payer: Self-pay

## 2024-02-13 ENCOUNTER — Other Ambulatory Visit (HOSPITAL_COMMUNITY): Payer: Self-pay

## 2024-02-14 ENCOUNTER — Other Ambulatory Visit (HOSPITAL_COMMUNITY): Payer: Self-pay

## 2024-02-18 ENCOUNTER — Ambulatory Visit (INDEPENDENT_AMBULATORY_CARE_PROVIDER_SITE_OTHER): Payer: MEDICAID | Admitting: Family Medicine

## 2024-02-18 ENCOUNTER — Other Ambulatory Visit: Payer: Self-pay

## 2024-02-18 ENCOUNTER — Other Ambulatory Visit (HOSPITAL_COMMUNITY): Payer: Self-pay

## 2024-02-18 ENCOUNTER — Encounter: Payer: Self-pay | Admitting: Family Medicine

## 2024-02-18 VITALS — BP 154/100 | HR 73 | Temp 97.4°F | Resp 18 | Wt 245.2 lb

## 2024-02-18 DIAGNOSIS — I700 Atherosclerosis of aorta: Secondary | ICD-10-CM

## 2024-02-18 DIAGNOSIS — Z7985 Long-term (current) use of injectable non-insulin antidiabetic drugs: Secondary | ICD-10-CM | POA: Diagnosis not present

## 2024-02-18 DIAGNOSIS — I10 Essential (primary) hypertension: Secondary | ICD-10-CM

## 2024-02-18 DIAGNOSIS — R1012 Left upper quadrant pain: Secondary | ICD-10-CM

## 2024-02-18 DIAGNOSIS — E6609 Other obesity due to excess calories: Secondary | ICD-10-CM

## 2024-02-18 DIAGNOSIS — E66811 Obesity, class 1: Secondary | ICD-10-CM

## 2024-02-18 DIAGNOSIS — E782 Mixed hyperlipidemia: Secondary | ICD-10-CM

## 2024-02-18 DIAGNOSIS — E1165 Type 2 diabetes mellitus with hyperglycemia: Secondary | ICD-10-CM

## 2024-02-18 DIAGNOSIS — Z6833 Body mass index (BMI) 33.0-33.9, adult: Secondary | ICD-10-CM

## 2024-02-18 LAB — POCT GLYCOSYLATED HEMOGLOBIN (HGB A1C): Hemoglobin A1C: 6.3 % — AB (ref 4.0–5.6)

## 2024-02-18 MED ORDER — OLMESARTAN-AMLODIPINE-HCTZ 40-10-25 MG PO TABS
1.0000 | ORAL_TABLET | Freq: Every day | ORAL | 3 refills | Status: DC
  Filled 2024-02-18: qty 90, 90d supply, fill #0
  Filled 2024-03-27 – 2024-05-04 (×3): qty 90, 90d supply, fill #1
  Filled 2024-05-07: qty 90, 90d supply, fill #2

## 2024-02-18 NOTE — Patient Instructions (Signed)
  VISIT SUMMARY: You came in today for a follow-up visit to manage your hypertension, diabetes, and other health concerns. We discussed your current symptoms, medications, and made some adjustments to your treatment plan to better control your conditions.  YOUR PLAN: -HYPERTENSION: Your blood pressure remains high at 154/100 mmHg despite your current medications. Hypertension means your blood pressure is consistently too high, which can lead to serious health problems if not managed. We are changing your medication to a combination pill (olmesartan -amlodipine -hydrochlorothiazide  40 mg-10 mg-25 mg daily) to simplify your regimen and improve adherence. Please stop taking indapamide  and olmesartan  separately. Recheck your blood pressure in two weeks.  -TYPE 2 DIABETES MELLITUS: Greatly improved A1C. It is at goal at 6.3. Continue simaglutide 2 mg injection weekly Continue to work on diet and exercise.   -LEFT UPPER QUADRANT ABDOMINAL PAIN: You have been experiencing constant pain in your left upper abdomen for about a month, which worsens with activity. This pain could be due to gastrointestinal or cardiac issues. We will order a CT scan of your abdomen and refer you to a cardiologist for further evaluation.  -GASTROESOPHAGEAL REFLUX DISEASE WITH ESOPHAGITIS: Your GERD, which causes stomach acid to flow back into your esophagus, is being managed with esomeprazole  and famotidine . Continue taking esomeprazole  40 mg in the morning and famotidine  40 mg in the evening as prescribed.  -OBESITY: Your BMI is 33, which classifies you as obese. Obesity means having excess body fat, which can lead to various health issues. Your weight has been stable, so continue with your current lifestyle modifications, including diet and exercise.  -HYPERLIPIDEMIA: Hyperlipidemia means you have high levels of fats (lipids) in your blood, which can increase your risk of heart disease. We need to recheck your cholesterol levels  with fasting labs in two weeks to accurately assess your condition.  INSTRUCTIONS: 1. Stop taking indapamide  and olmesartan  separately and start the new combination pill (olmesartan -amlodipine -hydrochlorothiazide  40 mg-10 mg-25 mg daily). 2. Recheck your blood pressure in two weeks. 3. Check your A1c today and reassess diabetes management in two weeks. 4. Continue taking semaglutide  2 mg weekly as prescribed. 5. Schedule a CT scan of your abdomen. 6. Follow up with a cardiologist for further evaluation of your abdominal pain. 7. Continue taking esomeprazole  40 mg in the morning and famotidine  40 mg in the evening for GERD. 8. Maintain your current lifestyle modifications for weight management. 9. Recheck your cholesterol levels with fasting labs in two weeks.

## 2024-02-18 NOTE — Progress Notes (Signed)
 Assessment & Plan   Assessment/Plan:    Assessment & Plan Hypertension Hypertension remains uncontrolled with current regimen of indapamide  and olmesartan . Blood pressure is 154/100 mmHg. Current medications are at maximum doses. A combination pill is recommended to simplify the regimen and improve adherence. - Stop indapamide  and olmesartan . - Prescribe olmesartan -amlodipine -hydrochlorothiazide  40 mg-10 mg-25 mg daily. - Recheck blood pressure in two weeks.  Type 2 diabetes mellitus Type 2 diabetes mellitus with last A1c of 10.3%. Currently managed with semaglutide  2 mg weekly. No adjustments made today. A1c will be checked to reassess management. - Check A1c today. - Continue semaglutide  2 mg weekly. - Reassess diabetes management in two weeks.  Left upper quadrant abdominal pain Chronic left upper quadrant abdominal pain for about a month. Pain is constant and worsens with activity. Previous workup including colonoscopy and EGD showed mild reflux esophagitis. No diarrhea or constipation. Differential includes gastrointestinal and cardiac causes. Referral to cardiology is recommended due to atypical presentation of cardiac issues in diabetics. - Order CT scan of the abdomen. - Refer to cardiology for evaluation.  Gastroesophageal reflux disease with esophagitis GERD with esophagitis managed with esomeprazole  and famotidine . - Continue esomeprazole  40 mg in the morning. - Continue famotidine  40 mg in the evening.  Obesity Obesity with BMI of 33. Weight has been stable over the past three months. - Continue lifestyle modifications including diet and exercise.  Hyperlipidemia Hyperlipidemia. Last cholesterol check was six months ago. Fasting labs are needed for accurate assessment. - Recheck cholesterol in two weeks with fasting labs.      Medications Discontinued During This Encounter  Medication Reason   olmesartan  (BENICAR ) 40 MG tablet    indapamide  (LOZOL ) 2.5 MG  tablet    hyoscyamine  (LEVSIN ) 0.125 MG tablet    metoCLOPramide  (REGLAN ) 5 MG tablet    Simethicone  180 MG CAPS     Return in about 2 weeks (around 03/03/2024) for BP, DM, fasting labs.        Subjective:   Encounter date: 02/18/2024  Phillip Gill is a 48 y.o. male who has Anxiety; Other constipation; Anxiety and depression; Fibromyalgia muscle pain; Anal condyloma; Gynecomastia; HTN (hypertension); Chronic hepatitis B (HCC); LGV (lymphogranuloma venereum); Primary syphilis; Gastroenteritis; HIV (human immunodeficiency virus infection) (HCC); Tobacco abuse; Overweight; Chronic pain associated with significant psychosocial dysfunction; Gonorrhea; Human immunodeficiency virus (HIV) disease (HCC); Foot lesion; Hepatitis B surface antigen positive; Healthcare maintenance; Erectile dysfunction; Cervical pain; Human monkeypox; Encounter for physical examination related to employment; Snoring; Urinary frequency; Left foot pain; Poor social situation; MDD (major depressive disorder); Passive suicidal ideations; OSA on CPAP; MDD (major depressive disorder), recurrent severe, without psychosis (HCC); Gastroesophageal reflux disease; Neck mass; Nocturia; Carpal tunnel syndrome on both sides; Hyponatremia; AKI (acute kidney injury) (HCC); Type 2 diabetes mellitus with hyperglycemia (HCC); 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   He  has a past medical history of Anxiety, Arthritis, At increased risk for cardiovascular disease (11/21/2023), Chronic hepatitis B (HCC), Chronic lower back pain, Depression, Drug-induced nausea and vomiting (02/06/2024), Fibromyalgia, Genital warts, HIV disease (HCC) (02/28/2015), HIV infection (HCC), Hypertension, IBS (irritable bowel syndrome), Migraine, Overweight (07/20/2015), and Renal insufficiency (12/29/2013).[name]   He presents with chief  complaint of Hypertension (1 week follow up. //HM due- Hep vaccine and foot exam; diabetic eye exam report request from [facility] ) .   Discussed the use of AI scribe software for clinical note  transcription with the patient, who gave verbal consent to proceed.  History of Present Illness Phillip Gill is a 48 year old male with hypertension and diabetes who presents for follow-up.  He has experienced improvement in nausea and vomiting after his psychiatrist adjusted his psychiatric medication, reducing bupropion  from 150 mg XR to 100 mg sustained release.  His blood pressure was measured at 154/100 mmHg. He is currently taking indapamide  1.25 mg and olmesartan  40 mg for hypertension. He experiences left-sided abdominal discomfort, described as a constant pain for about a month, which worsens with activity. No chest pain, shortness of breath, diarrhea, or constipation. He has not had any trauma to the area and does not take any medication for the pain.  For diabetes, he is on semaglutide  2 mg weekly. His last A1c was 10.3%.  He has a history of GERD with esophagitis, managed with esomeprazole  40 mg in the morning and famotidine  40 mg in the evening. No chest pain or shortness of breath.  He underwent a colonoscopy in December, which did not reveal significant findings, and an EGD showed mild reflux esophagitis. A CT scan from a few years ago showed moderate wall thickening and pericolonic edema consistent with colitis, but no diverticulitis was noted.  His weight has been stable over the past three months, and his BMI is 33.     ROS  Past Surgical History:  Procedure Laterality Date   CO2 LASER APPLICATION N/A 02/11/2013   Procedure: CO2 LASER APPLICATION;  Surgeon: Bernarda Ned, MD;  Location: [facility] [place];  Service: General;  Laterality: N/A;   FOOT SURGERY     HIGH RESOLUTION ANOSCOPY N/A 02/11/2013   Procedure: HIGH RESOLUTION ANOSCOPY WITH BIOPSY,  LASER ABLATION;  Surgeon: Bernarda Ned, MD;  Location: [facility];  Service: General;  Laterality: N/A;   WISDOM TOOTH EXTRACTION      Outpatient Medications Prior to Visit  Medication Sig Dispense Refill   ARIPiprazole  (ABILIFY ) 5 MG tablet Take 1 tablet (5 mg total) by mouth daily. 90 tablet 0   bictegravir-emtricitabine -tenofovir  AF (BIKTARVY ) 50-200-25 MG TABS tablet TAKE 1 TABLET BY MOUTH EVERY DAY 30 tablet 5   Blood Glucose Monitoring Suppl (BLOOD GLUCOSE MONITOR SYSTEM) w/Device KIT Use to check blood sugar in the morning, at noon, and at bedtime. 1 kit 0   buPROPion  ER (WELLBUTRIN  SR) 100 MG 12 hr tablet Take 1 tablet (100 mg total) by mouth daily. 30 tablet 0   Continuous Glucose Sensor (FREESTYLE LIBRE 3 SENSOR) MISC Place 1 sensor on the skin every 14 days. Use to check glucose continuously 2 each 4   diclofenac  Sodium (VOLTAREN ) 1 % GEL Apply 4 grams topically 4 (four) times daily to affected joint. 100 g 11   Elastic Bandages & Supports (CARPAL TUNNEL WRIST STABILIZER) MISC 1 each by Does not apply route as needed. 1 each 0   Elastic Bandages & Supports (MEDICAL COMPRESSION STOCKINGS) MISC 1 each by Does not apply route daily. 1 each 0   escitalopram  (LEXAPRO ) 20 MG tablet Take 1 tablet (20 mg total) by mouth daily. 90 tablet 0   esomeprazole  (NEXIUM ) 40 MG capsule Take 1 capsule (40 mg total) by mouth 2 (two) times daily before a meal. 180 capsule 3   famotidine  (PEPCID ) 40 MG tablet Take 1 tablet (40 mg total) by mouth at bedtime. 60 tablet 0   finasteride  (PROSCAR ) 5 MG tablet Take 1 tablet (5 mg total) by mouth daily. 30 tablet  1   rosuvastatin  (CRESTOR ) 10 MG tablet Take 1 tablet (10 mg total) by mouth daily. 30 tablet 5   Semaglutide , 2 MG/DOSE, 8 MG/3ML SOPN Inject 2 mg as directed once a week. 9 mL 0   sildenafil  (VIAGRA ) 100 MG tablet Take 0.5-1 tablets (50-100 mg total) by mouth daily as needed for erectile dysfunction. Do not exceed 100 mg in a day 30  tablet 1   Vitamin D , Ergocalciferol , (DRISDOL ) 1.25 MG (50000 UNIT) CAPS capsule Take 1 capsule (50,000 Units total) by mouth every 7 (seven) days. 8 capsule 0   hyoscyamine  (LEVSIN ) 0.125 MG tablet Take 1 tablet (0.125 mg total) by mouth every 4 (four) hours as needed for cramping. 30 tablet 0   indapamide  (LOZOL ) 2.5 MG tablet Take 1 tablet (2.5 mg total) by mouth daily. 90 tablet 0   metoCLOPramide  (REGLAN ) 5 MG tablet Take 1 tablet (5 mg total) by mouth every 6 (six) hours as needed for nausea. 30 tablet 0   olmesartan  (BENICAR ) 40 MG tablet Take 1 tablet (40 mg total) by mouth daily. 90 tablet 0   Simethicone  180 MG CAPS Take 1 capsule (180 mg total) by mouth 3 (three) times daily with meals as needed (bloating). 30 capsule 0   No facility-administered medications prior to visit.    Family History  Problem Relation Age of Onset   Hypertension Mother    Diabetes Mother    Stroke Mother        cerbral aneurysm   Pancreatic cancer Mother    Colon cancer Father    Hypertension Brother    Sleep apnea Cousin    Mental illness Neg Hx    Stomach cancer Neg Hx    Esophageal cancer Neg Hx     Social History   Socioeconomic History   Marital status: Single    Spouse name: Not on file   Number of children: Not on file   Years of education: Not on file   Highest education level: Some college, no degree  Occupational History   Not on file  Tobacco Use   Smoking status: Former    Current packs/day: 0.00    Average packs/day: 1 pack/day for 18.0 years (18.0 ttl pk-yrs)    Types: Cigarettes    Start date: 07/03/2003    Quit date: 07/02/2021    Years since quitting: 2.6    Passive exposure: Never   Smokeless tobacco: Former  Advertising account planner   Vaping status: Former   Quit date: 12/01/2022   Substances: Nicotine   Substance and Sexual Activity   Alcohol use: Yes    Comment: occasional use   Drug use: No   Sexual activity: Not on file    Comment: accepted condoms  Other Topics Concern    Not on file  Social History Narrative   Grew up in [place], now living in [place],-  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    Finished HS.   Doing online classes with [employer]- studying accounting   Previously 2 years of classes at [employer].    Has Partner- [name]- together for 1 year.    Has 2 girls- (born in 2000).          Social Drivers of Health   Financial Resource Strain: Medium Risk (02/02/2024)   Overall Financial Resource Strain (CARDIA)    Difficulty of Paying Living Expenses: Somewhat hard  Food Insecurity: Food Insecurity Present (02/02/2024)   Hunger Vital Sign    Worried About  Running Out of Food in the Last Year: Sometimes true    Ran Out of Food in the Last Year: Sometimes true  Transportation Needs: No Transportation Needs (02/02/2024)   PRAPARE - Administrator, Civil Service (Medical): No    Lack of Transportation (Non-Medical): No  Physical Activity: Inactive (02/02/2024)   Exercise Vital Sign    Days of Exercise per Week: 0 days    Minutes of Exercise per Session: Not on file  Stress: Stress Concern Present (02/02/2024)   [employer] of Occupational Health - Occupational Stress Questionnaire    Feeling of Stress: Rather much  Social Connections: Moderately Integrated (02/02/2024)   Social Connection and Isolation Panel    Frequency of Communication with Friends and Family: Three times a week    Frequency of Social Gatherings with Friends and Family: Three times a week    Attends Religious Services: More than 4 times per year    Active Member of Clubs or Organizations: Yes    Attends Banker Meetings: More than 4 times per year    Marital Status: Never married  Intimate Partner Violence: Not At Risk (10/12/2023)   Humiliation, Afraid, Rape, and Kick questionnaire    Fear of Current or Ex-Partner: No    Emotionally Abused: No    Physically Abused: No    Sexually Abused: No                                                                                                   Objective:  Physical Exam: BP (!) 154/100 (BP Location: Right Arm, Patient Position: Sitting, Cuff Size: Large) Comment: recheck  Pulse 73   Temp (!) 97.4 F (36.3 C) (Temporal)   Resp 18   Wt 245 lb 3.2 oz (111.2 kg)   SpO2 98%   BMI 33.26 kg/m   Wt Readings from Last 3 Encounters:  02/18/24 245 lb 3.2 oz (111.2 kg)  02/06/24 246 lb 12.8 oz (111.9 kg)  02/03/24 245 lb (111.1 kg)    Physical Exam VITALS: BP- 154/100 MEASUREMENTS: Weight- 45, BMI- 33.0. GENERAL: Alert, cooperative, well developed, no acute distress. HEENT: Normocephalic, normal oropharynx, moist mucous membranes. CHEST: Clear to auscultation bilaterally, no wheezes, rhonchi, or crackles. CARDIOVASCULAR: Normal heart rate and rhythm, S1 and S2 normal without murmurs. ABDOMEN: Soft, non-distended, without organomegaly, normal bowel sounds. Left upper quadrant abdominal tenderness. EXTREMITIES: No cyanosis or edema. NEUROLOGICAL: Cranial nerves grossly intact, moves all extremities without gross motor or sensory deficit.   Physical Exam  DG Chest 2 View Result Date: 01/26/2024 CLINICAL DATA:  Chest pain.  Panic attack. EXAM: CHEST - 2 VIEW COMPARISON:  10/20/2022 FINDINGS: The cardiac silhouette, mediastinal and hilar contours are normal. The lungs are clear. No pleural effusions or pneumothorax. No pulmonary lesions. The bony thorax is intact. IMPRESSION: No acute cardiopulmonary findings. Electronically Signed   By: MYRTIS Stammer M.D.   On: 01/26/2024 21:57    Recent Results (from the past 2160 hours)  Basic Metabolic Panel (BMET)     Status: Abnormal   Collection Time: 11/29/23  8:51  AM  Result Value Ref Range   Sodium 139 135 - 145 mEq/L   Potassium 3.7 3.5 - 5.1 mEq/L   Chloride 104 96 - 112 mEq/L   CO2 25 19 - 32 mEq/L   Glucose, Bld 111 (H) 70 - 99 mg/dL   BUN 16 6 - 23 mg/dL   Creatinine, Ser 8.77 0.40 - 1.50 mg/dL   GFR 29.34 >39.99  mL/min    Comment: Calculated using the CKD-EPI Creatinine Equation (2021)   Calcium  9.3 8.4 - 10.5 mg/dL  Basic metabolic panel     Status: Abnormal   Collection Time: 01/26/24  9:50 PM  Result Value Ref Range   Sodium 138 135 - 145 mmol/L   Potassium 3.4 (L) 3.5 - 5.1 mmol/L   Chloride 103 98 - 111 mmol/L   CO2 27 22 - 32 mmol/L   Glucose, Bld 107 (H) 70 - 99 mg/dL    Comment: Glucose reference range applies only to samples taken after fasting for at least 8 hours.   BUN 15 6 - 20 mg/dL   Creatinine, Ser 8.67 (H) 0.61 - 1.24 mg/dL   Calcium  9.1 8.9 - 10.3 mg/dL   GFR, Estimated >39 >39 mL/min    Comment: (NOTE) Calculated using the CKD-EPI Creatinine Equation (2021)    Anion gap 8 5 - 15    Comment: Performed at [facility], [address], [place], [place] [address]  CBC     Status: Abnormal   Collection Time: 01/26/24  9:50 PM  Result Value Ref Range   WBC 12.4 (H) 4.0 - 10.5 K/uL   RBC 4.78 4.22 - 5.81 MIL/uL   Hemoglobin 14.0 13.0 - 17.0 g/dL   HCT 57.6 60.9 - 47.9 %   MCV 88.5 80.0 - 100.0 fL   MCH 29.3 26.0 - 34.0 pg   MCHC 33.1 30.0 - 36.0 g/dL   RDW 86.4 88.4 - 84.4 %   Platelets 245 150 - 400 K/uL   nRBC 0.0 0.0 - 0.2 %    Comment: Performed at [facility], [address], [place], [place] [address]  Troponin I (High Sensitivity)     Status: None   Collection Time: 01/26/24  9:50 PM  Result Value Ref Range   Troponin I (High Sensitivity) 5 <18 ng/L    Comment: (NOTE) Elevated high sensitivity troponin I (hsTnI) values and significant  changes across serial measurements may suggest ACS but many other  chronic and acute conditions are known to elevate hsTnI results.  Refer to the Links section for chest pain algorithms and additional  guidance. Performed at [facility], [address] [place], [place] [address]   Troponin I (High Sensitivity)     Status: None   Collection Time: 01/27/24 12:07 AM  Result Value Ref Range   Troponin I (High Sensitivity) 5  <18 ng/L    Comment: (NOTE) Elevated high sensitivity troponin I (hsTnI) values and significant  changes across serial measurements may suggest ACS but many other  chronic and acute conditions are known to elevate hsTnI results.  Refer to the Links section for chest pain algorithms and additional  guidance. Performed at [facility], [address], [place], [place] [address]   POC COVID-19 BinaxNow     Status: Normal   Collection Time: 02/06/24  9:25 AM  Result Value Ref Range   SARS Coronavirus 2 Ag Negative Negative  POCT glycosylated hemoglobin (Hb A1C)     Status: Abnormal   Collection Time: 02/18/24  9:53 AM  Result Value Ref Range   Hemoglobin A1C 6.3 (A) 4.0 - 5.6 %   HbA1c POC (<> result, manual entry)     HbA1c, POC (prediabetic range)     HbA1c, POC (controlled diabetic range)          Beverley Adine Hummer, MD, MS

## 2024-02-24 ENCOUNTER — Other Ambulatory Visit (HOSPITAL_COMMUNITY): Payer: Self-pay

## 2024-02-24 ENCOUNTER — Other Ambulatory Visit: Payer: Self-pay

## 2024-02-25 ENCOUNTER — Other Ambulatory Visit (HOSPITAL_COMMUNITY): Payer: Self-pay

## 2024-02-27 ENCOUNTER — Ambulatory Visit (HOSPITAL_COMMUNITY): Payer: MEDICAID | Admitting: Licensed Clinical Social Worker

## 2024-02-27 DIAGNOSIS — F330 Major depressive disorder, recurrent, mild: Secondary | ICD-10-CM | POA: Diagnosis not present

## 2024-02-27 DIAGNOSIS — F411 Generalized anxiety disorder: Secondary | ICD-10-CM

## 2024-02-27 NOTE — Progress Notes (Signed)
 THERAPIST PROGRESS NOTE   Session Date: 02/27/2024  Session Time: 1005 - 1107  Participation Level: Active  MSE/Presentation: Behavior: Appropriate and Sharing Speech: Normal Thought Process: Coherent Cognition: Alert and Appropriate Mood: Negative and Dysphoric Affect: Depressed and Flat Insight: Lacking Appearance: Casual  Type of Therapy: Individual Therapy  Treatment Goals addressed:   Progressing (5) LTG: Reduce frequency, intensity, and duration of depression symptoms so that daily functioning is improved (OP Depression) LTG: Increase coping skills to manage depression and improve ability to perform daily activities (OP Depression) LTG: To not be depressed, have more energy, and stop over eating (OP Depression) STG: Report a decrease in anxiety symptoms as evidenced by an overall reduction in anxiety score by a minimum of 25% on the Generalized Anxiety Disorder Scale (GAD-7) (Anxiety) STG: Bowdy Bair will reduce frequency of avoidant behaviors by 50% as evidenced by self-report in therapy sessions (Anxiety)  Not Progressing (1)  Progress Towards Goals: Not Progressing  Interventions: CBT, Solution Focused, and Supportive  Summary: Phillip Gill is a 48 y.o. male with past psych history of MDD and GAD, presenting for follow up therapy session in efforts to improve management of depressive and anxious symptoms.  Patient actively engaged in session, presenting in overall depressed moods, and congruent affect.  Patient actively engaged in introductory check-in, sharing of doing As best as I can, detailing of physical pain surrounding foot pain proving to impact gait. Pt detailed recent ED presentation for panic attack, actively processing individual observed increase in anxiousness surrounding work feeling like the walls are closing in on me, further exploring work related stressors proving to be contributing factors to increased anxiousness experienced while in specific  environment. Pt actively processed lack of support, challenging feelings surrounding nursing home care, developing attachments with patients and experiencing loss, and overall feelings surrounding work environment. Processed pt's readiness to explore alternate employment opportunities as previously identified, exploring individual abilities in challenging irrational thoughts. Actively reviewed recent med man appt with Lynnette, MD, noting of having adjusted medications due to adverse s/e and interactions with weight loss medications. Further explored pt's continued stress surrounding home life and elderly aunts medical needs proving to be unmet, processing pt's perspectives surrounding aunt's adult children's lack of commitment to adequate care, feeling that as she is residing at pt's home, she is adequately cared for. Processed potential supports from home health provider, which pt acknowledged to be of benefit, noting however older aunt to be opposed to strangers in her home, and further explored potential connection with APS for added support in meeting aunts' needs. Pt continues to be of negative perspective, opposing any potential behavioral activation or changes.  Patient responded well to interventions. Patient continues to meet criteria for MDD and GAD. Patient will continue to benefit from engagement in outpatient therapy due to being the least restrictive service to meet presenting needs.      01/16/2024   10:19 AM 12/26/2023   10:20 AM 12/05/2023    9:18 AM 11/22/2023   10:38 AM  GAD 7 : Generalized Anxiety Score  Nervous, Anxious, on Edge 3 3 3 3   Control/stop worrying 3 3 3 3   Worry too much - different things 3 3 3 3   Trouble relaxing 0 1 0 2  Restless 1 3 1 2   Easily annoyed or irritable 3 3 3 3   Afraid - awful might happen 1 1 3 3   Total GAD 7 Score 14 17 16 19   Anxiety Difficulty Extremely difficult Extremely difficult Extremely difficult Extremely  difficult      02/18/2024    9:25 AM  01/16/2024   10:53 AM 12/26/2023   10:21 AM 12/24/2023    8:41 AM 12/05/2023    9:19 AM  Depression screen PHQ 2/9  Decreased Interest 3 3 3 3 3   Down, Depressed, Hopeless 3 3 3 3 3   PHQ - 2 Score 6 6 6 6 6   Altered sleeping  3 3  1   Tired, decreased energy  3 3  1   Change in appetite  3 0  3  Feeling bad or failure about yourself   3 3  3   Trouble concentrating  3 3  3   Moving slowly or fidgety/restless  1 1  1   Suicidal thoughts  3 1  1   PHQ-9 Score  25 20  19   Difficult doing work/chores  Extremely dIfficult Extremely dIfficult  Extremely dIfficult   Suicidal/Homicidal: Passive, No plan to harm self or others  Therapist Response: Clinician utilized CBT, solution focused, and supportive reflection interventions to support pt in navigation presenting sxs.   Clinician openly greeted pt upon presenting for today's visit, assessing presenting moods and affect. Actively engaged pt in introductory check-in, utilizing open ended questions in efforts to elicit pt's recounts of events, recent stress, and factors contributing to presenting moods. Utilized active listening techniques to provide support and validation of pt's expressed thoughts and feelings, utilizing CBT and psychoed interventions to prompt pt's challenging of irrational, unrealistic thoughts. Utilized socratic questions to greater prompt pt's critical processing of identified thoughts and perspectives, challenging irrational thought patterns and distorted perspectives.  Clinician reassessed presentation of sxs and presence of safety concerns. Clinician provided support and empathy to patient during session.  Plan: Return again in 4 weeks.  Diagnosis:  Encounter Diagnoses  Name Primary?   MDD (major depressive disorder), recurrent episode, mild (HCC) Yes   GAD (generalized anxiety disorder)     Collaboration of Care: Other None necessary at this time.  Patient/Guardian was advised Release of Information must be obtained  prior to any record release in order to collaborate their care with an outside provider. Patient/Guardian was advised if they have not already done so to contact the registration department to sign all necessary forms in order for us  to release information regarding their care.   Consent: Patient/Guardian gives verbal consent for treatment and assignment of benefits for services provided during this visit. Patient/Guardian expressed understanding and agreed to proceed.   Lynwood JONETTA Maris, MSW, LCSW 02/27/2024,  10:08 AM

## 2024-02-28 ENCOUNTER — Ambulatory Visit (HOSPITAL_BASED_OUTPATIENT_CLINIC_OR_DEPARTMENT_OTHER): Admission: RE | Admit: 2024-02-28 | Payer: MEDICAID | Source: Ambulatory Visit

## 2024-02-28 ENCOUNTER — Other Ambulatory Visit: Payer: Self-pay

## 2024-03-03 ENCOUNTER — Encounter: Payer: Self-pay | Admitting: Family Medicine

## 2024-03-03 ENCOUNTER — Ambulatory Visit (HOSPITAL_BASED_OUTPATIENT_CLINIC_OR_DEPARTMENT_OTHER)
Admission: RE | Admit: 2024-03-03 | Discharge: 2024-03-03 | Disposition: A | Discharge status: Home / Self Care | Payer: MEDICAID | Source: Ambulatory Visit | Attending: Family Medicine | Admitting: Family Medicine

## 2024-03-03 ENCOUNTER — Other Ambulatory Visit: Payer: Self-pay

## 2024-03-03 ENCOUNTER — Encounter (HOSPITAL_COMMUNITY): Payer: Self-pay

## 2024-03-03 ENCOUNTER — Other Ambulatory Visit (HOSPITAL_COMMUNITY): Payer: Self-pay

## 2024-03-03 ENCOUNTER — Ambulatory Visit: Payer: MEDICAID | Admitting: Family Medicine

## 2024-03-03 VITALS — BP 117/79 | HR 87 | Temp 97.8°F | Resp 18 | Wt 242.4 lb

## 2024-03-03 DIAGNOSIS — R1012 Left upper quadrant pain: Secondary | ICD-10-CM | POA: Diagnosis present

## 2024-03-03 DIAGNOSIS — E559 Vitamin D deficiency, unspecified: Secondary | ICD-10-CM

## 2024-03-03 DIAGNOSIS — E1129 Type 2 diabetes mellitus with other diabetic kidney complication: Secondary | ICD-10-CM

## 2024-03-03 DIAGNOSIS — E1165 Type 2 diabetes mellitus with hyperglycemia: Secondary | ICD-10-CM

## 2024-03-03 DIAGNOSIS — I10 Essential (primary) hypertension: Secondary | ICD-10-CM

## 2024-03-03 DIAGNOSIS — M5010 Cervical disc disorder with radiculopathy, unspecified cervical region: Secondary | ICD-10-CM | POA: Diagnosis not present

## 2024-03-03 DIAGNOSIS — R197 Diarrhea, unspecified: Secondary | ICD-10-CM

## 2024-03-03 DIAGNOSIS — R809 Proteinuria, unspecified: Secondary | ICD-10-CM | POA: Diagnosis not present

## 2024-03-03 DIAGNOSIS — E782 Mixed hyperlipidemia: Secondary | ICD-10-CM

## 2024-03-03 DIAGNOSIS — K219 Gastro-esophageal reflux disease without esophagitis: Secondary | ICD-10-CM

## 2024-03-03 DIAGNOSIS — Z6832 Body mass index (BMI) 32.0-32.9, adult: Secondary | ICD-10-CM

## 2024-03-03 DIAGNOSIS — E66811 Obesity, class 1: Secondary | ICD-10-CM

## 2024-03-03 DIAGNOSIS — M722 Plantar fascial fibromatosis: Secondary | ICD-10-CM

## 2024-03-03 LAB — COMPREHENSIVE METABOLIC PANEL WITH GFR
ALT: 23 U/L (ref 0–53)
AST: 23 U/L (ref 0–37)
Albumin: 4.3 g/dL (ref 3.5–5.2)
Alkaline Phosphatase: 76 U/L (ref 39–117)
BUN: 22 mg/dL (ref 6–23)
CO2: 29 meq/L (ref 19–32)
Calcium: 9.6 mg/dL (ref 8.4–10.5)
Chloride: 101 meq/L (ref 96–112)
Creatinine, Ser: 1.46 mg/dL (ref 0.40–1.50)
GFR: 56.85 mL/min — ABNORMAL LOW (ref >60.00)
Glucose, Bld: 110 mg/dL — ABNORMAL HIGH (ref 70–99)
Potassium: 3.4 meq/L — ABNORMAL LOW (ref 3.5–5.1)
Sodium: 140 meq/L (ref 135–145)
Total Bilirubin: 0.3 mg/dL (ref 0.2–1.2)
Total Protein: 8.2 g/dL (ref 6.0–8.3)

## 2024-03-03 LAB — LIPID PANEL
Cholesterol: 135 mg/dL (ref 0–200)
HDL: 32.5 mg/dL — ABNORMAL LOW (ref >39.00)
LDL Cholesterol: 53 mg/dL (ref 0–99)
NonHDL: 102.92
Total CHOL/HDL Ratio: 4
Triglycerides: 251 mg/dL — ABNORMAL HIGH (ref 0.0–149.0)
VLDL: 50.2 mg/dL — ABNORMAL HIGH (ref 0.0–40.0)

## 2024-03-03 LAB — VITAMIN D 25 HYDROXY (VIT D DEFICIENCY, FRACTURES): VITD: 35.24 ng/mL (ref 30.00–100.00)

## 2024-03-03 MED ORDER — SEMAGLUTIDE (2 MG/DOSE) 8 MG/3ML ~~LOC~~ SOPN
2.0000 mg | PEN_INJECTOR | SUBCUTANEOUS | 0 refills | Status: DC
Start: 2024-03-03 — End: 2024-04-21
  Filled 2024-03-03 – 2024-03-09 (×2): qty 9, 84d supply, fill #0

## 2024-03-03 MED ORDER — IOHEXOL 300 MG/ML  SOLN
85.0000 mL | Freq: Once | INTRAMUSCULAR | Status: AC | PRN
  Administered 2024-03-03: 85 mL via INTRAVENOUS

## 2024-03-03 MED ORDER — KETOROLAC TROMETHAMINE 60 MG/2ML IM SOLN
60.0000 mg | Freq: Once | INTRAMUSCULAR | Status: AC
  Administered 2024-03-03: 60 mg via INTRAMUSCULAR

## 2024-03-03 MED ORDER — CELECOXIB 200 MG PO CAPS
200.0000 mg | ORAL_CAPSULE | Freq: Two times a day (BID) | ORAL | 0 refills | Status: DC | PRN
  Filled 2024-03-03: qty 60, 30d supply, fill #0

## 2024-03-03 MED ORDER — CYCLOBENZAPRINE HCL 5 MG PO TABS
5.0000 mg | ORAL_TABLET | Freq: Three times a day (TID) | ORAL | 0 refills | Status: DC | PRN
  Filled 2024-03-03: qty 60, 10d supply, fill #0

## 2024-03-03 NOTE — Progress Notes (Signed)
 Assessment & Plan   Assessment/Plan:    Assessment & Plan Left arm and neck pain due to cervical spondylosis with radiculopathy Acute exacerbation of left arm and neck pain, likely due to cervical spondylosis with radiculopathy. Pain radiates from the neck to the shoulder and down the arm. Less likely to be cardiac in origin given recent normal cardiac workup. Musculoskeletal origin considered, with potential referral pain from cervical spine issues. - Administer ketorolac  60 mg injection for pain relief. - Prescribe Celebrex  200 mg twice daily as needed, with food, for anti-inflammatory effect. - Prescribe Flexeril  5-10 mg up to three times daily as needed for muscle relaxation. - Refer to Dr. Artist Lloyd for potential ongoing pain management and evaluation of musculoskeletal issues.  Type 2 diabetes mellitus Type 2 diabetes mellitus well-controlled with significant improvement in A1c from 10.3% to 6.3%. - Continue current regimen of Ozempic  2 mg once weekly.  Hypertension Hypertension well-controlled with current regimen. - Continue olmesartan , amlodipine , and hydrochlorothiazide .  Hyperlipidemia Hyperlipidemia under management with rosuvastatin  10 mg. - Order lipid panel to assess current status.  Chronic abdominal pain of LUQ and diarrhea Chronic abdominal pain with recent improvement. Diarrhea reported as frequent bowel movements. - Order CT scan of the abdomen. - Refer to gastroenterology for further evaluation of abdominal pain and diarrhea.  Gastroesophageal reflux disease (GERD) GERD managed with omeprazole 40 mg daily. - Assess current symptoms and adjust treatment if necessary.  Plantar fasciitis, both foot Plantar fasciitis in the left foot with recent flare-up. Previous surgery noted. - Refer to podiatry for evaluation and management.  General Health Maintenance Discussed flu and hepatitis B vaccinations. - Patient opted to defer vaccinations  today.  Follow-Up - Schedule follow-up appointment in one month to reassess abdominal pain and other ongoing issues. - Ensure he receives referrals to gastroenterology and podiatry. - Advise to contact if no referral response within two weeks.      Medications Discontinued During This Encounter  Medication Reason   Semaglutide , 2 MG/DOSE, 8 MG/3ML SOPN Reorder     Return in about 1 month (around 04/02/2024) for BP, DM, HLD, Vitamin D .        Subjective:   Encounter date: 03/03/2024  Phillip Gill is a 48 y.o. male who has Anxiety; Other constipation; Anxiety and depression; Fibromyalgia muscle pain; Anal condyloma; Gynecomastia; HTN (hypertension); Chronic hepatitis B (HCC); LGV (lymphogranuloma venereum); Primary syphilis; Gastroenteritis; HIV (human immunodeficiency virus infection) (HCC); Tobacco abuse; Overweight; Chronic pain associated with significant psychosocial dysfunction; Gonorrhea; Human immunodeficiency virus (HIV) disease (HCC); Foot lesion; Hepatitis B surface antigen positive; Healthcare maintenance; Erectile dysfunction; Cervical pain; Human monkeypox; Encounter for physical examination related to employment; Snoring; Urinary frequency; Left foot pain; Poor social situation; MDD (major depressive disorder); Passive suicidal ideations; OSA on CPAP; MDD (major depressive disorder), recurrent severe, without psychosis (HCC); Gastroesophageal reflux disease; Neck mass; Nocturia; Carpal tunnel syndrome on both sides; Hyponatremia; AKI (acute kidney injury) (HCC); Type 2 diabetes mellitus with hyperglycemia (HCC); 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   He  has a past medical history of Anxiety, Arthritis, At increased risk for cardiovascular disease (11/21/2023), Chronic hepatitis B (HCC), Chronic lower back pain, Depression, Drug-induced  nausea and vomiting (02/06/2024), Fibromyalgia, Genital warts, HIV disease (HCC) (02/28/2015), HIV infection (HCC), Hypertension, IBS (irritable bowel syndrome), Migraine, Overweight (07/20/2015), and Renal insufficiency (12/29/2013).[name]   He presents with chief complaint of Diabetes (Pt  is not fasting today//HM due- Hep B vaccine ) and left shoulder (Pt c/o of left shoulder and arm pain since yesterday morning; no injury occurred ) .   Discussed the use of AI scribe software for clinical note transcription with the patient, who gave verbal consent to proceed.  History of Present Illness 02/18/2024  Nikash Mortensen Embry Huss is a 48 year old male with hypertension and diabetes who presents for follow-up.  He has experienced improvement in nausea and vomiting after his psychiatrist adjusted his psychiatric medication, reducing bupropion  from 150 mg XR to 100 mg sustained release.  His blood pressure was measured at 154/100 mmHg. He is currently taking indapamide  1.25 mg and olmesartan  40 mg for hypertension. He experiences left-sided abdominal discomfort, described as a constant pain for about a month, which worsens with activity. No chest pain, shortness of breath, diarrhea, or constipation. He has not had any trauma to the area and does not take any medication for the pain.  For diabetes, he is on semaglutide  2 mg weekly. His last A1c was 10.3%.  He has a history of GERD with esophagitis, managed with esomeprazole  40 mg in the morning and famotidine  40 mg in the evening. No chest pain or shortness of breath.  He underwent a colonoscopy in December, which did not reveal significant findings, and an EGD showed mild reflux esophagitis. A CT scan from a few years ago showed moderate wall thickening and pericolonic edema consistent with colitis, but no diverticulitis was noted.  His weight has been stable over the past three months, and his BMI is 33.  03/03/2024  Damian M Andreas Sobolewski is a 48  year old male with cervical spondylosis who presents with left arm pain.  He has left arm pain that began yesterday, starting in the shoulder blade and radiating down the arm. The pain is worsening and associated with neck discomfort. He has not taken any medication for it, and the pain is not improving.  He has a history of cervical spondylosis with multilevel foraminal narrowing, including moderate right C5 foraminal stenosis and severe left foraminal stenosis. He has experienced similar neck and arm pain in the past.  He has a history of hypertension, diabetes, and hyperlipidemia. His diabetes management has improved significantly, with his A1c dropping from 10.3% to 6.3%. He is currently on a medication regimen of olmesartan , amlodipine , and hydrochlorothiazide , which has improved his adherence. He takes rosuvastatin  10 mg for hyperlipidemia.  He reports chronic abdominal pain, primarily in the left upper quadrant, which has eased somewhat. He is scheduled for a CT scan today to further assess this pain. He has a history of GERD and takes omeprazole 40 mg daily. He experiences frequent loose stools, which he does not classify as diarrhea.  He has a history of plantar fasciitis, which has flared up again at the site of previous surgery four years ago.     ROS  Past Surgical History:  Procedure Laterality Date   CO2 LASER APPLICATION N/A 02/11/2013   Procedure: CO2 LASER APPLICATION;  Surgeon: Bernarda Ned, MD;  Location: [facility];  Service: General;  Laterality: N/A;   FOOT SURGERY     HIGH RESOLUTION ANOSCOPY N/A 02/11/2013   Procedure: HIGH RESOLUTION ANOSCOPY WITH BIOPSY, LASER ABLATION;  Surgeon: Bernarda Ned, MD;  Location: [facility];  Service: General;  Laterality: N/A;   WISDOM TOOTH EXTRACTION      Outpatient Medications Prior to Visit  Medication Sig Dispense Refill  ARIPiprazole  (ABILIFY ) 5 MG tablet Take 1 tablet (5 mg total) by mouth  daily. 90 tablet 0   bictegravir-emtricitabine -tenofovir  AF (BIKTARVY ) 50-200-25 MG TABS tablet TAKE 1 TABLET BY MOUTH EVERY DAY 30 tablet 5   Blood Glucose Monitoring Suppl (BLOOD GLUCOSE MONITOR SYSTEM) w/Device KIT Use to check blood sugar in the morning, at noon, and at bedtime. 1 kit 0   buPROPion  ER (WELLBUTRIN  SR) 100 MG 12 hr tablet Take 1 tablet (100 mg total) by mouth daily. 30 tablet 0   Continuous Glucose Sensor (FREESTYLE LIBRE 3 SENSOR) MISC Place 1 sensor on the skin every 14 days. Use to check glucose continuously 2 each 4   diclofenac  Sodium (VOLTAREN ) 1 % GEL Apply 4 grams topically 4 (four) times daily to affected joint. 100 g 11   Elastic Bandages & Supports (CARPAL TUNNEL WRIST STABILIZER) MISC 1 each by Does not apply route as needed. 1 each 0   Elastic Bandages & Supports (MEDICAL COMPRESSION STOCKINGS) MISC 1 each by Does not apply route daily. 1 each 0   escitalopram  (LEXAPRO ) 20 MG tablet Take 1 tablet (20 mg total) by mouth daily. 90 tablet 0   esomeprazole  (NEXIUM ) 40 MG capsule Take 1 capsule (40 mg total) by mouth 2 (two) times daily before a meal. 180 capsule 3   famotidine  (PEPCID ) 40 MG tablet Take 1 tablet (40 mg total) by mouth at bedtime. 60 tablet 0   finasteride  (PROSCAR ) 5 MG tablet Take 1 tablet (5 mg total) by mouth daily. 30 tablet 1   Olmesartan -amLODIPine -HCTZ 40-10-25 MG TABS Take 1 tablet by mouth daily. 90 tablet 3   rosuvastatin  (CRESTOR ) 10 MG tablet Take 1 tablet (10 mg total) by mouth daily. 30 tablet 5   sildenafil  (VIAGRA ) 100 MG tablet Take 0.5-1 tablets (50-100 mg total) by mouth daily as needed for erectile dysfunction. Do not exceed 100 mg in a day 30 tablet 1   Vitamin D , Ergocalciferol , (DRISDOL ) 1.25 MG (50000 UNIT) CAPS capsule Take 1 capsule (50,000 Units total) by mouth every 7 (seven) days. 8 capsule 0   Semaglutide , 2 MG/DOSE, 8 MG/3ML SOPN Inject 2 mg as directed once a week. 9 mL 0   No facility-administered medications prior to  visit.    Family History  Problem Relation Age of Onset   Hypertension Mother    Diabetes Mother    Stroke Mother        cerbral aneurysm   Pancreatic cancer Mother    Colon cancer Father    Hypertension Brother    Sleep apnea Cousin    Mental illness Neg Hx    Stomach cancer Neg Hx    Esophageal cancer Neg Hx     Social History   Socioeconomic History   Marital status: Single    Spouse name: Not on file   Number of children: Not on file   Years of education: Not on file   Highest education level: Some college, no degree  Occupational History   Not on file  Tobacco Use   Smoking status: Former    Current packs/day: 0.00    Average packs/day: 1 pack/day for 18.0 years (18.0 ttl pk-yrs)    Types: Cigarettes    Start date: 07/03/2003    Quit date: 07/02/2021    Years since quitting: 2.6    Passive exposure: Never   Smokeless tobacco: Former  Advertising account planner   Vaping status: Former   Quit date: 12/01/2022   Substances: Nicotine   Substance and  Sexual Activity   Alcohol use: Yes    Comment: occasional use   Drug use: No   Sexual activity: Not Currently    Partners: Male    Comment: accepted condoms  Other Topics Concern   Not on file  Social History Narrative   Grew up in [place], now living in [place],-  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    Finished HS.   Doing online classes with [employer]- studying accounting   Previously 2 years of classes at [employer].    Has Partner- [name]- together for 1 year.    Has 2 girls- (born in 2000).          Social Drivers of Health   Financial Resource Strain: Medium Risk (02/02/2024)   Overall Financial Resource Strain (CARDIA)    Difficulty of Paying Living Expenses: Somewhat hard  Food Insecurity: Food Insecurity Present (02/02/2024)   Hunger Vital Sign    Worried About Running Out of Food in the Last Year: Sometimes true    Ran Out of Food in the Last Year: Sometimes true  Transportation Needs: No  Transportation Needs (02/02/2024)   PRAPARE - Administrator, Civil Service (Medical): No    Lack of Transportation (Non-Medical): No  Physical Activity: Inactive (02/02/2024)   Exercise Vital Sign    Days of Exercise per Week: 0 days    Minutes of Exercise per Session: Not on file  Stress: Stress Concern Present (02/02/2024)   [employer] of Occupational Health - Occupational Stress Questionnaire    Feeling of Stress: Rather much  Social Connections: Moderately Integrated (02/02/2024)   Social Connection and Isolation Panel    Frequency of Communication with Friends and Family: Three times a week    Frequency of Social Gatherings with Friends and Family: Three times a week    Attends Religious Services: More than 4 times per year    Active Member of Clubs or Organizations: Yes    Attends Banker Meetings: More than 4 times per year    Marital Status: Never married  Intimate Partner Violence: Not At Risk (10/12/2023)   Humiliation, Afraid, Rape, and Kick questionnaire    Fear of Current or Ex-Partner: No    Emotionally Abused: No    Physically Abused: No    Sexually Abused: No                                                                                                  Objective:  Physical Exam: BP 117/79 (BP Location: Right Arm, Patient Position: Sitting, Cuff Size: Large)   Pulse 87   Temp 97.8 F (36.6 C) (Temporal)   Resp 18   Wt 242 lb 6.4 oz (110 kg)   SpO2 100%   BMI 32.88 kg/m   Wt Readings from Last 3 Encounters:  03/03/24 242 lb 6.4 oz (110 kg)  02/18/24 245 lb 3.2 oz (111.2 kg)  02/06/24 246 lb 12.8 oz (111.9 kg)    Physical Exam  MEASUREMENTS: Weight- 45, BMI- 33.0. GENERAL: Alert, cooperative, well developed, no acute distress. HEENT:  Normocephalic, normal oropharynx, moist mucous membranes. CHEST: Clear to auscultation bilaterally, no wheezes, rhonchi, or crackles. CARDIOVASCULAR: Normal heart rate and rhythm, S1 and S2 normal  without murmurs. ABDOMEN: Soft, non-distended, without organomegaly, normal bowel sounds. Left upper quadrant abdominal tenderness. EXTREMITIES: No cyanosis or edema. NEUROLOGICAL: Cranial nerves grossly intact, moves all extremities without gross motor or sensory deficit.   VITALS: BP- 117/79 GENERAL: Alert, cooperative, well developed, no acute distress. HEENT: Normocephalic, normal oropharynx, moist mucous membranes. CHEST: Clear to auscultation bilaterally, no wheezes, rhonchi, or crackles. Lungs non-tender to palpation. CARDIOVASCULAR: Normal heart rate and rhythm, S1 and S2 normal without murmurs. ABDOMEN: Soft, non-tender, non-distended, without organomegaly, normal bowel sounds. EXTREMITIES: No cyanosis or edema. NEUROLOGICAL: Cranial nerves grossly intact, moves all extremities without gross motor or sensory deficit.   Physical Exam  DG Chest 2 View Result Date: 01/26/2024 CLINICAL DATA:  Chest pain.  Panic attack. EXAM: CHEST - 2 VIEW COMPARISON:  10/20/2022 FINDINGS: The cardiac silhouette, mediastinal and hilar contours are normal. The lungs are clear. No pleural effusions or pneumothorax. No pulmonary lesions. The bony thorax is intact. IMPRESSION: No acute cardiopulmonary findings. Electronically Signed   By: MYRTIS Stammer M.D.   On: 01/26/2024 21:57    Recent Results (from the past 2160 hours)  Basic metabolic panel     Status: Abnormal   Collection Time: 01/26/24  9:50 PM  Result Value Ref Range   Sodium 138 135 - 145 mmol/L   Potassium 3.4 (L) 3.5 - 5.1 mmol/L   Chloride 103 98 - 111 mmol/L   CO2 27 22 - 32 mmol/L   Glucose, Bld 107 (H) 70 - 99 mg/dL    Comment: Glucose reference range applies only to samples taken after fasting for at least 8 hours.   BUN 15 6 - 20 mg/dL   Creatinine, Ser 8.67 (H) 0.61 - 1.24 mg/dL   Calcium  9.1 8.9 - 10.3 mg/dL   GFR, Estimated >39 >39 mL/min    Comment: (NOTE) Calculated using the CKD-EPI Creatinine Equation (2021)    Anion  gap 8 5 - 15    Comment: Performed at [facility], [address] [place], [place] [address]  CBC     Status: Abnormal   Collection Time: 01/26/24  9:50 PM  Result Value Ref Range   WBC 12.4 (H) 4.0 - 10.5 K/uL   RBC 4.78 4.22 - 5.81 MIL/uL   Hemoglobin 14.0 13.0 - 17.0 g/dL   HCT 57.6 60.9 - 47.9 %   MCV 88.5 80.0 - 100.0 fL   MCH 29.3 26.0 - 34.0 pg   MCHC 33.1 30.0 - 36.0 g/dL   RDW 86.4 88.4 - 84.4 %   Platelets 245 150 - 400 K/uL   nRBC 0.0 0.0 - 0.2 %    Comment: Performed at [facility], [address] [place], [place] [address]  Troponin I (High Sensitivity)     Status: None   Collection Time: 01/26/24  9:50 PM  Result Value Ref Range   Troponin I (High Sensitivity) 5 <18 ng/L    Comment: (NOTE) Elevated high sensitivity troponin I (hsTnI) values and significant  changes across serial measurements may suggest ACS but many other  chronic and acute conditions are known to elevate hsTnI results.  Refer to the Links section for chest pain algorithms and additional  guidance. Performed at [facility], [address], [place], [place] [address]   Troponin I (High Sensitivity)     Status: None   Collection  Time: 01/27/24 12:07 AM  Result Value Ref Range   Troponin I (High Sensitivity) 5 <18 ng/L    Comment: (NOTE) Elevated high sensitivity troponin I (hsTnI) values and significant  changes across serial measurements may suggest ACS but many other  chronic and acute conditions are known to elevate hsTnI results.  Refer to the Links section for chest pain algorithms and additional  guidance. Performed at [facility], [address], [place], [place] [address]   POC COVID-19 BinaxNow     Status: Normal   Collection Time: 02/06/24  9:25 AM  Result Value Ref Range   SARS Coronavirus 2 Ag Negative Negative  POCT glycosylated hemoglobin (Hb A1C)     Status: Abnormal   Collection Time: 02/18/24  9:53 AM  Result Value Ref Range   Hemoglobin A1C 6.3 (A) 4.0 - 5.6 %    HbA1c POC (<> result, manual entry)     HbA1c, POC (prediabetic range)     HbA1c, POC (controlled diabetic range)          Beverley Adine Hummer, MD, MS

## 2024-03-03 NOTE — Patient Instructions (Signed)
  VISIT SUMMARY: Today, you were seen for left arm pain that started yesterday and is associated with your known cervical spondylosis. We also reviewed your history of hypertension, diabetes, hyperlipidemia, chronic abdominal pain, GERD, and plantar fasciitis.  YOUR PLAN: -LEFT ARM AND NECK PAIN DUE TO CERVICAL SPONDYLOSIS WITH RADICULOPATHY: This pain is due to nerve irritation from your cervical spine condition. You received a ketorolac  injection for pain relief today. You are prescribed Celebrex  200 mg twice daily as needed with food for inflammation and Flexeril  5-10 mg up to three times daily as needed for muscle relaxation. You are referred to Dr. Artist Lloyd for further pain management and evaluation.  -TYPE 2 DIABETES MELLITUS: Your diabetes is well-controlled with a significant improvement in your A1c levels. Continue your current regimen of Ozempic  2 mg once weekly.  -HYPERTENSION: Your blood pressure is well-controlled with your current medications. Continue taking olmesartan , amlodipine , and hydrochlorothiazide  as prescribed.  -HYPERLIPIDEMIA: Your cholesterol levels are being managed with rosuvastatin  10 mg. A lipid panel has been ordered to assess your current status.  -CHRONIC ABDOMINAL PAIN AND DIARRHEA: You have chronic abdominal pain and frequent bowel movements. A CT scan of your abdomen has been ordered, and you are referred to gastroenterology for further evaluation.  -GASTROESOPHAGEAL REFLUX DISEASE (GERD): Your GERD is managed with omeprazole 40 mg daily. We will assess your current symptoms and adjust treatment if necessary.  -PLANTAR FASCIITIS, LEFT FOOT: You have a flare-up of plantar fasciitis in your left foot. You are referred to podiatry for evaluation and management.  - Vitamin D : We are rechecking levels.  If still low we will continue taking vitamin D  supplementation.  -GENERAL HEALTH MAINTENANCE: We discussed flu and hepatitis B vaccinations, but you opted to  defer them today.  INSTRUCTIONS: Please schedule a follow-up appointment in one month to reassess your abdominal pain and other ongoing issues. Ensure you receive referrals to gastroenterology and podiatry. Contact us  if you do not receive a response to your referrals within two weeks.

## 2024-03-05 ENCOUNTER — Other Ambulatory Visit (HOSPITAL_COMMUNITY): Payer: Self-pay

## 2024-03-06 ENCOUNTER — Encounter (HOSPITAL_COMMUNITY): Payer: MEDICAID | Admitting: Student

## 2024-03-09 ENCOUNTER — Other Ambulatory Visit: Payer: Self-pay

## 2024-03-09 ENCOUNTER — Encounter: Payer: Self-pay | Admitting: Podiatry

## 2024-03-09 ENCOUNTER — Ambulatory Visit (INDEPENDENT_AMBULATORY_CARE_PROVIDER_SITE_OTHER): Payer: MEDICAID | Admitting: Podiatry

## 2024-03-09 ENCOUNTER — Other Ambulatory Visit (HOSPITAL_COMMUNITY): Payer: Self-pay

## 2024-03-09 ENCOUNTER — Other Ambulatory Visit: Payer: Self-pay | Admitting: Family Medicine

## 2024-03-09 ENCOUNTER — Ambulatory Visit (INDEPENDENT_AMBULATORY_CARE_PROVIDER_SITE_OTHER): Payer: MEDICAID

## 2024-03-09 VITALS — Ht 72.0 in | Wt 242.4 lb

## 2024-03-09 DIAGNOSIS — M7752 Other enthesopathy of left foot: Secondary | ICD-10-CM

## 2024-03-09 DIAGNOSIS — E1165 Type 2 diabetes mellitus with hyperglycemia: Secondary | ICD-10-CM

## 2024-03-09 DIAGNOSIS — M7751 Other enthesopathy of right foot: Secondary | ICD-10-CM

## 2024-03-09 DIAGNOSIS — M722 Plantar fascial fibromatosis: Secondary | ICD-10-CM | POA: Diagnosis not present

## 2024-03-09 MED ORDER — MELOXICAM 15 MG PO TABS
15.0000 mg | ORAL_TABLET | Freq: Every day | ORAL | 1 refills | Status: DC
  Filled 2024-03-09: qty 60, 60d supply, fill #0
  Filled 2024-03-30 – 2024-05-19 (×4): qty 60, 60d supply, fill #1

## 2024-03-09 MED ORDER — METHYLPREDNISOLONE 4 MG PO TBPK
ORAL_TABLET | ORAL | 0 refills | Status: DC
  Filled 2024-03-09: qty 21, 6d supply, fill #0

## 2024-03-09 MED ORDER — BETAMETHASONE SOD PHOS & ACET 6 (3-3) MG/ML IJ SUSP
3.0000 mg | Freq: Once | INTRAMUSCULAR | Status: AC
  Administered 2024-03-09: 3 mg via INTRA_ARTICULAR

## 2024-03-09 NOTE — Progress Notes (Signed)
 Chief Complaint  Patient presents with   Foot Pain    Pt is here due to left foot pain, started a month ago, states no injury to the foot, states he had surgery on the same foot 4 years ago due to plantar fasciitis, states it hard to put weight on the foot.    HPI: 48 y.o. male presenting today as a reestablish new patient for above complaint.  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 Efrain- sees him every 4 months   Hypertension    IBS (irritable bowel syndrome)    Migraine    none in years (02/16/2015   Overweight 07/20/2015   Renal insufficiency 12/29/2013    Past Surgical History:  Procedure Laterality Date   CO2 LASER APPLICATION N/A 02/11/2013   Procedure: CO2 LASER APPLICATION;  Surgeon: Bernarda Ned, MD;  Location: [facility];  Service: General;  Laterality: N/A;   FOOT SURGERY     HIGH RESOLUTION ANOSCOPY N/A 02/11/2013   Procedure: HIGH RESOLUTION ANOSCOPY WITH BIOPSY, LASER ABLATION;  Surgeon: Bernarda Ned, MD;  Location: [facility];  Service: General;  Laterality: N/A;   WISDOM TOOTH EXTRACTION      Allergies  Allergen Reactions   Bactrim [Sulfamethoxazole W/Trimethoprim (Co-Trimoxazole)] Hives and Shortness Of Breath   Sulfa Antibiotics Hives and Shortness Of Breath   Cymbalta  [Duloxetine  Hcl] Diarrhea   Duloxetine  Diarrhea   Metformin  And Related Nausea And Vomiting   Other Hives and Swelling    Colgate toothpaste    Pork-Derived Products Other (See Comments)    Pt refused to eat pork products.   Zoloft [Sertraline Hcl] Diarrhea   Clindamycin /Lincomycin Rash   Neurontin   [Gabapentin ] Rash     Physical Exam: General: The patient is alert and oriented x3 in no acute distress.  Dermatology: Skin is warm, dry and supple bilateral lower extremities.   Vascular: Palpable pedal pulses bilaterally. Capillary refill within normal limits.  No appreciable edema.  No erythema.  Neurological: Grossly intact via light touch  Musculoskeletal Exam: No pedal deformities noted.  Significant tenderness with palpation along the plantar arch and area of the excision of the plantar fibroma left foot  Radiographic Exam LT foot 03/09/2024:  Normal osseous mineralization. Joint spaces preserved.  No fractures or osseous irregularities noted.  Impression: Negative  Assessment/Plan of Care: 1.  Plantar fasciitis left 2.  History of excision of plantar fibroma LT foot.  DOS: 02/25/2020  -Patient evaluated.  X-rays reviewed -Injection of 0.5 cc Celestone  Soluspan injected in the plantar fascia left -Prescription for Medrol  Dosepak -Prescription for meloxicam  15 mg daily after completion of the Dosepak -Cam boot dispensed.  WBAT -Return to clinic 4 weeks       Thresa EMERSON Sar, DPM [facility]  Dr. Thresa EMERSON Sar, DPM    [address]  [place], [place] [address]                Office [phone]  Fax [phone]

## 2024-03-10 ENCOUNTER — Other Ambulatory Visit: Payer: Self-pay

## 2024-03-10 ENCOUNTER — Ambulatory Visit: Payer: Self-pay | Admitting: Family Medicine

## 2024-03-10 ENCOUNTER — Other Ambulatory Visit (HOSPITAL_COMMUNITY): Payer: Self-pay

## 2024-03-10 MED ORDER — FREESTYLE LIBRE 3 SENSOR MISC
1.0000 | Freq: Every day | 4 refills | Status: DC
  Filled 2024-03-10 – 2024-03-16 (×3): qty 2, 28d supply, fill #0
  Filled 2024-04-13: qty 2, 28d supply, fill #1
  Filled 2024-05-04 – 2024-05-07 (×2): qty 2, 28d supply, fill #2

## 2024-03-10 NOTE — Progress Notes (Unsigned)
   LILLETTE Ileana Collet, PhD, LAT, ATC acting as a scribe for Artist Lloyd, MD.  Phillip Gill is a 48 y.o. male who presents to [facility] at [facility] today for exacerbation of his cervical radiculopathy. Pt was last seen by Dr. Lloyd on 04/22/23.  Today, pt reports neck pain flared up last week. Pain started when he was laying down and tried to transition to sitting. Pt locates pain to the L-side of his neck, L trapz, periscapular region, w/ radiating pain throughout whole L arm, hand, and fingers  Radiates: yes UE n/t: yes UE weakness: yes Aggravates: arm movements, shoulder aBd, L-sided cervical rotation Treatments tried: Toradol  injection, cyclobenzaprine , Celebrex   Dx testing:  03/26/23 C-spine MRI 12/19/22 C-spine XR   Pertinent review of systems: No fevers or chills  Relevant historical information: Hypertension diabetes depression HIV.  Diabetes currently well-controlled. History of cervical radiculopathy in the past.  Exam:  BP 126/86   Pulse 74   Ht 6' (1.829 m)   SpO2 95%   BMI 32.88 kg/m  General: Well Developed, well nourished, and in no acute distress.   MSK: C-spine normal appearing Nontender palpation spinal midline.  Decreased cervical motion.  Upper extremity strength is intact. Reflexes are intact.  Positive Spurling's test.    Lab and Radiology Results  X-ray images cervical spine obtained today personally and independently interpreted. Mild degenerative changes.  No acute fractures. Await formal radiology review     Assessment and Plan: 48 y.o. male with left cervical radiculopathy most consistent with C7.  He did have stenosis at this region on MRI from September 2024.  Fortunately he does not have weakness.  Plan for epidural steroid injection.  I did prescribe Lyrica .  He is allergic to gabapentin .  His podiatrist recently prescribed a Medrol  Dosepak which he will start today or tomorrow which should also help the cervical  radiculopathy.  He will keep me updated with how he feels.  Toradol  shot given today prior to discharge.   PDMP reviewed during this encounter. Orders Placed This Encounter  Procedures   DG Cervical Spine 2 or 3 views    Standing Status:   Future    Number of Occurrences:   1    Expiration Date:   03/11/2025    Reason for Exam (SYMPTOM  OR DIAGNOSIS REQUIRED):   eval left C7 radicular pain    Preferred imaging location?:   [facility]   DG INJECT DIAG/THERA/INC NEEDLE/CATH/PLC EPI/LUMB/SAC W/IMG    Standing Status:   Future    Expiration Date:   03/11/2025    Reason for Exam (SYMPTOM  OR DIAGNOSIS REQUIRED):   Left C7 symptoms. ESI level and technique per radiology    Preferred Imaging Location?:   [facility]    Radiology Contrast Protocol - do NOT remove file path:   [email or web address]   Meds ordered this encounter  Medications   pregabalin  (LYRICA ) 75 MG capsule    Sig: Take 1 capsule (75 mg total) by mouth 2 (two) times daily as needed (nerve pain).    Dispense:  60 capsule    Refill:  3   ketorolac  (TORADOL ) injection 60 mg     Discussed warning signs or symptoms. Please see discharge instructions. Patient expresses understanding.   The above documentation has been reviewed and is accurate and complete Artist Lloyd, M.D.

## 2024-03-11 ENCOUNTER — Other Ambulatory Visit: Payer: Self-pay

## 2024-03-11 ENCOUNTER — Ambulatory Visit: Payer: Self-pay | Admitting: Family Medicine

## 2024-03-11 ENCOUNTER — Ambulatory Visit (INDEPENDENT_AMBULATORY_CARE_PROVIDER_SITE_OTHER): Payer: MEDICAID | Admitting: Family Medicine

## 2024-03-11 ENCOUNTER — Other Ambulatory Visit (HOSPITAL_COMMUNITY): Payer: Self-pay

## 2024-03-11 ENCOUNTER — Ambulatory Visit (INDEPENDENT_AMBULATORY_CARE_PROVIDER_SITE_OTHER): Payer: MEDICAID

## 2024-03-11 VITALS — BP 126/86 | HR 74 | Ht 72.0 in

## 2024-03-11 DIAGNOSIS — M5412 Radiculopathy, cervical region: Secondary | ICD-10-CM

## 2024-03-11 MED ORDER — KETOROLAC TROMETHAMINE 60 MG/2ML IM SOLN
60.0000 mg | Freq: Once | INTRAMUSCULAR | Status: AC
  Administered 2024-03-11: 60 mg via INTRAMUSCULAR

## 2024-03-11 MED ORDER — PREGABALIN 75 MG PO CAPS
75.0000 mg | ORAL_CAPSULE | Freq: Two times a day (BID) | ORAL | 3 refills | Status: AC | PRN
  Filled 2024-03-11: qty 60, 30d supply, fill #0
  Filled 2024-03-27 – 2024-06-02 (×5): qty 60, 30d supply, fill #1
  Filled 2024-06-22 – 2024-07-14 (×2): qty 60, 30d supply, fill #2

## 2024-03-11 NOTE — Patient Instructions (Addendum)
 Thank you for coming in today.   Please get an Xray today before you leave   Ketorolac  injection in the glute today.   Take the medrol  medicine prescribed by Dr Janit  Use lyrica  twice daily for nerve pain as needed.   Please call [facility] ([facility]) at [phone] to schedule your spine injection.    Let me know if this does not work.

## 2024-03-14 ENCOUNTER — Other Ambulatory Visit (HOSPITAL_COMMUNITY): Payer: Self-pay

## 2024-03-16 ENCOUNTER — Other Ambulatory Visit: Payer: Self-pay | Admitting: Pharmacy Technician

## 2024-03-16 ENCOUNTER — Other Ambulatory Visit (HOSPITAL_COMMUNITY): Payer: Self-pay

## 2024-03-16 ENCOUNTER — Other Ambulatory Visit: Payer: Self-pay

## 2024-03-16 ENCOUNTER — Other Ambulatory Visit: Payer: MEDICAID

## 2024-03-16 NOTE — Progress Notes (Signed)
 Specialty Pharmacy Refill Coordination Note  Phillip Gill is a 48 y.o. male contacted today regarding refills of specialty medication(s) Bictegravir-Emtricitab-Tenofov (Biktarvy )   Patient requested Delivery   Delivery date: 03/19/24   Verified address: [address]  [place]    Medication will be filled on 03/18/24.

## 2024-03-16 NOTE — Progress Notes (Unsigned)
   03/17/2024  Patient ID: Phillip Gill, male   DOB: 12/31/1975, 48 y.o.   MRN: [id number]  Patient outreach for scheduled telephone visit to follow-up on management of diabetes and hypertension was not successful.  I was able to leave HIPAA compliant voicemail, though; and I am sending a MyChart message to attempt to reschedule.  Phillip Gill, PharmD, DPLA

## 2024-03-17 ENCOUNTER — Other Ambulatory Visit: Payer: MEDICAID

## 2024-03-17 ENCOUNTER — Other Ambulatory Visit (HOSPITAL_COMMUNITY): Payer: Self-pay

## 2024-03-17 ENCOUNTER — Other Ambulatory Visit: Payer: Self-pay

## 2024-03-18 ENCOUNTER — Other Ambulatory Visit (HOSPITAL_COMMUNITY): Payer: Self-pay

## 2024-03-19 ENCOUNTER — Ambulatory Visit: Payer: Self-pay | Admitting: Family Medicine

## 2024-03-19 NOTE — Progress Notes (Signed)
 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

## 2024-03-20 ENCOUNTER — Other Ambulatory Visit (HOSPITAL_COMMUNITY): Payer: Self-pay

## 2024-03-23 NOTE — Progress Notes (Unsigned)
   03/23/2024  Patient ID: Phillip Gill, male   DOB: 1976-02-25, 48 y.o.   MRN: [id number]  Subjective/Objective Telephone visit to follow-up on management of diabetes and hypertension   Diabetes  -Current medications:  Ozempic  2mg  weekly -Ozempic  recently increased to 2mg  weekly and patient states he is tolerating this dose well with no adverse side effects -Using [facility] for CGM, and data is linked to [employer]- last 14 days reflects the following: CGM active 93% of the time Average glucose 126 GMI 6.3% High 3%, Target 97%, Low 0% -A1c on 4/8 reflected value of 10.3% -Significantly decreased occurrences BG <60 since last visit.  Only 3 documented over the past 2 weeks, versus 20 when we spoke 4 weeks ago.  Patient has been having more consistent meals and snacks. -Patient does state he has small sore on one of his toes he believes may be from cutting toenails too short.  Does not endorse any redness, discharge, or foul smell.  States this has been present 2-3 days and does bleed sometimes if shoe/sock rubs up against it.   Hypertension -Current medications:  olmesartan  40mg , indapamide  2.5mg  daily -Indapamide  and olmesartan  increased at PCP visit on 6/24 -Patient has not checked home BP since medication doses were increased -Last OV BP 134/91 on 6/30 -Patient states headache he was experiencing has improved, so hopefully this is a reflection of improved BP control   Assessment/Plan   Diabetes -Continue Ozempic  2mg  weekly -Continue CGM with [employer] -Due for A1c    Hypertension -Continue current regimen  -Instructed patient to monitor and record home BP readings approximately 1 hour after taking BP medications; bring readings to upcoming visit with Dr. Sebastian -I recommend f/u CMP since olmesartan  and indapamide  were recently increased  Follow-up:  2 months   [name], PharmD, DPLA

## 2024-03-24 ENCOUNTER — Other Ambulatory Visit: Payer: MEDICAID

## 2024-03-24 ENCOUNTER — Encounter: Payer: Self-pay | Admitting: Family Medicine

## 2024-03-24 ENCOUNTER — Other Ambulatory Visit (HOSPITAL_COMMUNITY): Payer: Self-pay

## 2024-03-24 NOTE — Discharge Instructions (Signed)

## 2024-03-25 ENCOUNTER — Ambulatory Visit (INDEPENDENT_AMBULATORY_CARE_PROVIDER_SITE_OTHER): Payer: MEDICAID | Admitting: Licensed Clinical Social Worker

## 2024-03-25 ENCOUNTER — Inpatient Hospital Stay
Admission: RE | Admit: 2024-03-25 | Discharge: 2024-03-25 | Disposition: A | Discharge status: Home / Self Care | Payer: MEDICAID | Source: Ambulatory Visit | Attending: Family Medicine | Admitting: Family Medicine

## 2024-03-25 DIAGNOSIS — F332 Major depressive disorder, recurrent severe without psychotic features: Secondary | ICD-10-CM | POA: Diagnosis not present

## 2024-03-25 NOTE — Progress Notes (Signed)
 THERAPIST PROGRESS NOTE   Session Date: 03/25/2024  Session Time: 0920 - 1010  Participation Level: Active  MSE/Presentation: Behavior: Appropriate and Sharing Speech: Normal Thought Process: Coherent Cognition: Alert and Appropriate Mood: Negative and Dysphoric Affect: Depressed and Flat Insight: Lacking Appearance: Casual  Type of Therapy: Individual Therapy  Treatment Goals addressed:   Not Progressing (6) LTG: Reduce frequency, intensity, and duration of depression symptoms so that daily functioning is improved (OP Depression) LTG: Increase coping skills to manage depression and improve ability to perform daily activities (OP Depression) LTG: To not be depressed, have more energy, and stop over eating (OP Depression) STG: Report a decrease in anxiety symptoms as evidenced by an overall reduction in anxiety score by a minimum of 25% on the Generalized Anxiety Disorder Scale (GAD-7) (Anxiety) STG: Phillip Gill will reduce frequency of avoidant behaviors by 50% as evidenced by self-report in therapy sessions (Anxiety) LTG: Be a more people person Increase frequency at which pt is actively engaging in social activities. (Anxiety)  Progress Towards Goals: Not Progressing  Interventions: CBT, Solution Focused, and Supportive  Summary: Phillip Gill is a 48 y.o. male with past psych history of MDD and GAD, presenting for follow up therapy session in efforts to improve management of depressive and anxious symptoms.  Patient actively engaged in session, presenting in overall depressed moods, and congruent affect.  Patient actively engaged in introductory check-in, sharing of not doing well, detailing of Plantar fasciitis flaring up causing severe pain and limiting mobility, resulting in being given a boot to support L foot, further sharing of how this has proven to impact him over the past few weeks, detailing of having not been back to work in the past month, dropping back to [employer] and  current awaiting disability determination. Pt explored how not working is proving to impact him, noting of only impacting financially, currently using funds from previous employment and desire to await disability determination versus return to work. Processed how pt being at home over the past month versus regularly working has impacted him, sharing of keeping to self and staying in room to avoid household drama with aunt, detailing aunt residing in the home to be losing her memory, asking the same thing multiple times, and feeling aunt needs greater care but other family members do not wish to pursue support. Reassessed presenting depressive and anxious sxs, noting of increased frequency and severity of sxs over recent weeks, engaging in exploration of potential benefits of pursuing greater support through higher LOC such as that of PHP. Reflected on previous experience in group, finding to be not for me, and processing recent transitions to in-person group format, expressing financial barriers to attended group daily. Engaged in mid-plan review of tx goals, noting of no progression towards identified goals over recent months. Reviewed upcoming med man visit with Lynnette, MD on 9/26, and potential benefit to explore PHP further.  Patient responded well to interventions. Patient continues to meet criteria for MDD and GAD. Patient will continue to benefit from engagement in outpatient therapy due to being the least restrictive service to meet presenting needs.      03/25/2024    9:32 AM 01/16/2024   10:19 AM 12/26/2023   10:20 AM 12/05/2023    9:18 AM  GAD 7 : Generalized Anxiety Score  Nervous, Anxious, on Edge 3 3 3 3   Control/stop worrying 3 3 3 3   Worry too much - different things 3 3 3 3   Trouble relaxing 3 0 1 0  Restless  0 1 3 1   Easily annoyed or irritable 3 3 3 3   Afraid - awful might happen 3 1 1 3   Total GAD 7 Score 18 14 17 16   Anxiety Difficulty Extremely difficult Extremely difficult  Extremely difficult Extremely difficult      03/25/2024    9:34 AM 02/18/2024    9:25 AM 01/16/2024   10:53 AM 12/26/2023   10:21 AM 12/24/2023    8:41 AM  Depression screen PHQ 2/9  Decreased Interest 3 3 3 3 3   Down, Depressed, Hopeless 3 3 3 3 3   PHQ - 2 Score 6 6 6 6 6   Altered sleeping 3  3 3    Tired, decreased energy 3  3 3    Change in appetite 3  3 0   Feeling bad or failure about yourself  3  3 3    Trouble concentrating 3  3 3    Moving slowly or fidgety/restless 3  1 1    Suicidal thoughts 0  3 1   PHQ-9 Score 24  25 20    Difficult doing work/chores Extremely dIfficult  Extremely dIfficult Extremely dIfficult    Suicidal/Homicidal: None, No plan to harm self or others  Therapist Response: Clinician utilized CBT, solution focused, and supportive reflection interventions to support pt in navigation presenting sxs.   Clinician actively greeted pt upon presenting for today's visit, assessing presenting moods and affect, engaging pt in introductory check-in, utilizing open ended questions in eliciting recounts of events of the past month, observable stressors, and factors contributing to presenting moods. Utilized active listening techniques to provide support and validation of pt's expressed feelings, further prompting exploration of thoughts and perspectives through socratic questioning. Engaged in reassessing presenting sxs, exploring increased sxs, and potential benefit of securing additional services via higher LOC. Engaged in mid-plan review of tx goals, processing regressions over recent months and means of supporting.  [x]  Cognitive Challenging     [x]  Cognitive Refocusing     []  Cognitive Reframing     [x]  Communication Skills     []  Compliance Issues []  DBT     [x]  Exploration of Coping Patterns     [x]  Exploration of Emotions     []  Exploration of Relationship Patterns     []  Guided Imagery []  Interactive Feedback     []  Interpersonal Resolutions     []  Mindfulness Training      [x]  Preventative Services     [x]  Psycho-Education []  Relaxation/Deep Breathing     [x]  Review of Treatment Plan/Progress     []  Role-Play/Behavioral Rehearsal     [x]  Structured Problem Solving [x]  Supportive Reflection     [x]  Symptom Management     []  Other   Plan: Return again in 4 weeks.  Diagnosis:  Encounter Diagnosis  Name Primary?   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  Patient/Guardian was advised Release of Information must be obtained prior to any record release in order to collaborate their care with an outside provider. Patient/Guardian was advised if they have not already done so to contact the registration department to sign all necessary forms in order for us  to release information regarding their care.   Consent: Patient/Guardian gives verbal consent for treatment and assignment of benefits for services provided during this visit. Patient/Guardian expressed understanding and agreed to proceed.   Phillip Gill, MSW, LCSW 03/25/2024,  9:37 AM

## 2024-03-26 ENCOUNTER — Other Ambulatory Visit (HOSPITAL_COMMUNITY): Payer: Self-pay

## 2024-03-26 ENCOUNTER — Other Ambulatory Visit: Payer: Self-pay

## 2024-03-27 ENCOUNTER — Other Ambulatory Visit: Payer: Self-pay | Admitting: Family

## 2024-03-27 ENCOUNTER — Other Ambulatory Visit: Payer: Self-pay | Admitting: Family Medicine

## 2024-03-27 ENCOUNTER — Ambulatory Visit (INDEPENDENT_AMBULATORY_CARE_PROVIDER_SITE_OTHER): Payer: MEDICAID | Admitting: Student

## 2024-03-27 ENCOUNTER — Other Ambulatory Visit (HOSPITAL_COMMUNITY): Payer: Self-pay

## 2024-03-27 ENCOUNTER — Other Ambulatory Visit: Payer: Self-pay

## 2024-03-27 DIAGNOSIS — F330 Major depressive disorder, recurrent, mild: Secondary | ICD-10-CM

## 2024-03-27 DIAGNOSIS — R351 Nocturia: Secondary | ICD-10-CM

## 2024-03-27 DIAGNOSIS — R112 Nausea with vomiting, unspecified: Secondary | ICD-10-CM

## 2024-03-27 DIAGNOSIS — E559 Vitamin D deficiency, unspecified: Secondary | ICD-10-CM

## 2024-03-27 DIAGNOSIS — F332 Major depressive disorder, recurrent severe without psychotic features: Secondary | ICD-10-CM | POA: Diagnosis not present

## 2024-03-27 MED ORDER — ARIPIPRAZOLE 5 MG PO TABS
5.0000 mg | ORAL_TABLET | Freq: Every day | ORAL | 0 refills | Status: DC
  Filled 2024-03-27 – 2024-06-22 (×5): qty 90, 90d supply, fill #0

## 2024-03-27 MED ORDER — ESCITALOPRAM OXALATE 20 MG PO TABS
20.0000 mg | ORAL_TABLET | Freq: Every day | ORAL | 0 refills | Status: DC
  Filled 2024-03-27 – 2024-06-02 (×6): qty 90, 90d supply, fill #0

## 2024-03-27 MED ORDER — BUPROPION HCL ER (SR) 100 MG PO TB12
100.0000 mg | ORAL_TABLET | Freq: Every day | ORAL | 0 refills | Status: DC
  Filled 2024-03-27: qty 90, 90d supply, fill #0

## 2024-03-27 NOTE — Progress Notes (Signed)
 Psychiatric Follow Up Adult Assessment  Patient Identification: Phillip Gill MRN:  [id number] Date of Evaluation:  03/27/2024 Referral Source: Beverley Hummer, MD  Assessment:  Phillip Gill is a 48 y.o. male with a history of MDD, GAD, panic attacks, HIV on HAART, OSA on CPAP, chronic hepatitis B, anal condyloma, GERD, hypertension who presents in person to [facility] for medication management. He was iniitally seen by me on 05/04/23 and diagnosed with MDD and GAD with panic attacks.  Patient reports ongoing anxiety and depression. He reports hypersomnia, depressed mood, and significant avoidance. He feels current psychotropics are helpful although still experiences depressed mood. We discussed PHP as his therapist had discussed but PHP being in person is a barrier due to him living with aunt in [place]. Encouraged him to have more behavioral activation but foot pain is a barrier. Will discuss TMS for patient or possible mental health resources in [place] that may allow patient more frequent access to mental healthcare such as PHP or IOP.   Plan:  # Major Depressive Disorder, recurrent episode Past medication trials:  Status of problem: active Interventions: -- Continue psychotherapy  -- Continue lexapro  20 milligrams daily -- Continue abilify  5 milligrams daily  -10/2023 A1c 10.3, 08/01/23 LDL cholesterol 130  - ECG 01/2022 Qtc 413 --Continue bupropion  to SR 100 mg daily  -No history of seizures  # Generalized anxiety disorder with panic attack Past medication trials:  Status of problem: Active Interventions: -- Lexapro  as above -- Abilify  as above --hold propranolol   Vitamin D  Deficiency -vitamin d  50,000 units weekly for 8 weeks -repeat vitamin D  level at follow up  Return to care in 1 month  Patient was given contact information for behavioral health clinic and was instructed to call 911 for emergencies.    Patient and plan of care will be discussed  with the Attending MD, who agrees with the above statement and plan.   Subjective:  Chief Complaint: Medication Management  Interval history Patient presents for follow-up.  Denies SI/HI/AVH.  Reports continuing to struggle from depressed mood and anxiety resulting in hypersomnia. He reports significant avoidant activity as he feels he does not want to work for fear of having a panic attack and he does not want to be awake at aunt's house due to constant verbal disputes. Reports self-isolation. Reports stable appetite. He feels his medications are presently helpful. He reports applying for disability but has been rejected. He states he will be getting a lawyer to aid with this process. Encouraged behavioral activation but patient reports foot pain is a barrier.   Past Psychiatric History:  Diagnoses: depression and anxiety Medication trials: Lexapro , elavil  (worsening pain), xanax , bupropion , celexa , valium , duloxetine  (GI distress), hydroxyzine , propranolol , risperidone, ambien , gabapentin  (hives) Previous psychiatrist/therapist: [facility], Dr. Barbaraann (decade ago), therapist in the past  Hospitalizations: denies Suicide attempts: denies SIB: denies Hx of violence towards others: denies Current access to guns: denies Hx of trauma/abuse: denies  Substance Abuse History in the last 12 months:  No.  Past Medical History:  Past Medical History:  Diagnosis Date   Anxiety    Arthritis    neck (02/16/2015)   At increased risk for cardiovascular disease 11/21/2023   Chronic hepatitis B (HCC)    SECONDARY TO HIV   Chronic lower back pain    Depression    Drug-induced nausea and vomiting 02/06/2024   Fibromyalgia    Genital warts    HIV disease (HCC) 02/28/2015  HIV infection (HCC)    followed by Dr. Efrain- sees him every 4 months   Hypertension    IBS (irritable bowel syndrome)    Migraine    none in years (02/16/2015   Overweight 07/20/2015   Renal  insufficiency 12/29/2013    Past Surgical History:  Procedure Laterality Date   CO2 LASER APPLICATION N/A 02/11/2013   Procedure: CO2 LASER APPLICATION;  Surgeon: Bernarda Ned, MD;  Location[facility];  Service: General;  Laterality: N/A;   FOOT SURGERY     HIGH RESOLUTION ANOSCOPY N/A 02/11/2013   Procedure: HIGH RESOLUTION ANOSCOPY WITH BIOPSY, LASER ABLATION;  Surgeon: Bernarda Ned, MD;  Location: [facility];  Service: General;  Laterality: N/A;   WISDOM TOOTH EXTRACTION      Family Psychiatric History:  denies  Family History:  Family History  Problem Relation Age of Onset   Hypertension Mother    Diabetes Mother    Stroke Mother        cerbral aneurysm   Pancreatic cancer Mother    Colon cancer Father    Hypertension Brother    Sleep apnea Cousin    Mental illness Neg Hx    Stomach cancer Neg Hx    Esophageal cancer Neg Hx     Social History:   Academic/Vocational: Unemployed Social History   Socioeconomic History   Marital status: Single    Spouse name: Not on file   Number of children: Not on file   Years of education: Not on file   Highest education level: Some college, no degree  Occupational History   Not on file  Tobacco Use   Smoking status: Former    Current packs/day: 0.00    Average packs/day: 1 pack/day for 18.0 years (18.0 ttl pk-yrs)    Types: Cigarettes    Start date: 07/03/2003    Quit date: 07/02/2021    Years since quitting: 2.7    Passive exposure: Never   Smokeless tobacco: Former  Advertising account planner   Vaping status: Former   Quit date: 12/01/2022   Substances: Nicotine   Substance and Sexual Activity   Alcohol use: Yes    Comment: occasional use   Drug use: No   Sexual activity: Not Currently    Partners: Male    Comment: accepted condoms  Other Topics Concern   Not on file  Social History Narrative   Grew up in 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    Finished HS.   Doing  online classes with [employer]- studying accounting   Previously 2 years of classes at [employer].    Has Partner- [name]- together for 1 year.    Has 2 girls- (born in 2000).          Social Drivers of Health   Financial Resource Strain: Medium Risk (02/02/2024)   Overall Financial Resource Strain (CARDIA)    Difficulty of Paying Living Expenses: Somewhat hard  Food Insecurity: Food Insecurity Present (02/02/2024)   Hunger Vital Sign    Worried About Running Out of Food in the Last Year: Sometimes true    Ran Out of Food in the Last Year: Sometimes true  Transportation Needs: No Transportation Needs (02/02/2024)   PRAPARE - Administrator, Civil Service (Medical): No    Lack of Transportation (Non-Medical): No  Physical Activity: Inactive (02/02/2024)   Exercise Vital Sign    Days of Exercise per Week: 0 days  Minutes of Exercise per Session: Not on file  Stress: Stress Concern Present (02/02/2024)   [employer] of Occupational Health - Occupational Stress Questionnaire    Feeling of Stress: Rather much  Social Connections: Moderately Integrated (02/02/2024)   Social Connection and Isolation Panel    Frequency of Communication with Friends and Family: Three times a week    Frequency of Social Gatherings with Friends and Family: Three times a week    Attends Religious Services: More than 4 times per year    Active Member of Clubs or Organizations: Yes    Attends Banker Meetings: More than 4 times per year    Marital Status: Never married    Additional Social History: updated  Allergies:   Allergies  Allergen Reactions   Bactrim [Sulfamethoxazole W/Trimethoprim (Co-Trimoxazole)] Hives and Shortness Of Breath   Sulfa Antibiotics Hives and Shortness Of Breath   Cymbalta  [Duloxetine  Hcl] Diarrhea   Duloxetine  Diarrhea   Metformin  And Related Nausea And Vomiting   Other Hives and Swelling    [employer] toothpaste    Pork-Derived  Products Other (See Comments)    Pt refused to eat pork products.   Zoloft [Sertraline Hcl] Diarrhea   Clindamycin /Lincomycin Rash   Neurontin  [Gabapentin ] Rash    Current Medications: Current Outpatient Medications  Medication Sig Dispense Refill   ARIPiprazole  (ABILIFY ) 5 MG tablet Take 1 tablet (5 mg total) by mouth daily. 90 tablet 0   bictegravir-emtricitabine -tenofovir  AF (BIKTARVY ) 50-200-25 MG TABS tablet TAKE 1 TABLET BY MOUTH EVERY DAY 30 tablet 5   Blood Glucose Monitoring Suppl (BLOOD GLUCOSE MONITOR SYSTEM) w/Device KIT Use to check blood sugar in the morning, at noon, and at bedtime. 1 kit 0   buPROPion  ER (WELLBUTRIN  SR) 100 MG 12 hr tablet Take 1 tablet (100 mg total) by mouth daily. 30 tablet 0   celecoxib  (CELEBREX ) 200 MG capsule Take 1 capsule (200 mg total) by mouth 2 (two) times daily as needed (pain). Take with food 60 capsule 0   Continuous Glucose Sensor (FREESTYLE LIBRE 3 SENSOR) MISC Place 1 sensor on the skin every 14 days. Use to check glucose continuously 2 each 4   cyclobenzaprine  (FLEXERIL ) 5 MG tablet Take 1-2 tablets (5-10 mg total) by mouth 3 (three) times daily as needed for muscle spasms. 60 tablet 0   diclofenac  Sodium (VOLTAREN ) 1 % GEL Apply 4 grams topically 4 (four) times daily to affected joint. 100 g 11   Elastic Bandages & Supports (CARPAL TUNNEL WRIST STABILIZER) MISC 1 each by Does not apply route as needed. 1 each 0   Elastic Bandages & Supports (MEDICAL COMPRESSION STOCKINGS) MISC 1 each by Does not apply route daily. 1 each 0   escitalopram  (LEXAPRO ) 20 MG tablet Take 1 tablet (20 mg total) by mouth daily. 90 tablet 0   esomeprazole  (NEXIUM ) 40 MG capsule Take 1 capsule (40 mg total) by mouth 2 (two) times daily before a meal. 180 capsule 3   famotidine  (PEPCID ) 40 MG tablet Take 1 tablet (40 mg total) by mouth at bedtime. 60 tablet 0   finasteride  (PROSCAR ) 5 MG tablet Take 1 tablet (5 mg total) by mouth daily. (Patient not taking: Reported on  03/11/2024) 30 tablet 1   meloxicam  (MOBIC ) 15 MG tablet Take 1 tablet (15 mg total) by mouth daily. 60 tablet 1   methylPREDNISolone  (MEDROL  DOSEPAK) 4 MG TBPK tablet 6 day dose pack - take as directed 21 tablet 0   Olmesartan -amLODIPine -HCTZ 40-10-25 MG TABS  Take 1 tablet by mouth daily. 90 tablet 3   pregabalin  (LYRICA ) 75 MG capsule Take 1 capsule (75 mg total) by mouth 2 (two) times daily as needed (nerve pain). 60 capsule 3   rosuvastatin  (CRESTOR ) 10 MG tablet Take 1 tablet (10 mg total) by mouth daily. 30 tablet 5   Semaglutide , 2 MG/DOSE, 8 MG/3ML SOPN Inject 2 mg as directed once a week. 9 mL 0   sildenafil  (VIAGRA ) 100 MG tablet Take 0.5-1 tablets (50-100 mg total) by mouth daily as needed for erectile dysfunction. Do not exceed 100 mg in a day 30 tablet 1   Vitamin D , Ergocalciferol , (DRISDOL ) 1.25 MG (50000 UNIT) CAPS capsule Take 1 capsule (50,000 Units total) by mouth every 7 (seven) days. 8 capsule 0   No current facility-administered medications for this visit.    ROS: Review of Systems   Objective:  Psychiatric Specialty Exam: There were no vitals taken for this visit.There is no height or weight on file to calculate BMI.  General Appearance: Casual  Eye Contact:  Fair  Speech:  Clear and Coherent and Normal Rate  Volume:  Decreased  Mood:  Anxious  Affect:  Appropriate and Congruent  Thought Content: Logical   Suicidal Thoughts:  No  Homicidal Thoughts:  No  Thought Process:  Coherent, Goal Directed, and Linear  Orientation:  Full (Time, Place, and Person)    Memory: Recent;   Fair  Judgment:  Intact  Insight:  Fair  Concentration:  Attention Span: Fair  Recall:  not formally assessed   Fund of Knowledge: Fair  Language: Fair  Psychomotor Activity:  Normal  Akathisia:  No  AIMS (if indicated): not done  Assets:  Manufacturing systems engineer Desire for Improvement Financial Resources/Insurance Housing Social Support Transportation  ADL's:  Intact  Cognition:  WNL  Sleep:  Good   PE: General: well-appearing; no acute distress  Pulm: no increased work of breathing on room air  Strength & Muscle Tone: within normal limits Neuro: no focal neurological deficits observed  Gait & Station: normal  Metabolic Disorder Labs: Lab Results  Component Value Date   HGBA1C 6.3 (A) 02/18/2024   MPG 111 10/02/2014   No results found for: PROLACTIN Lab Results  Component Value Date   CHOL 135 03/03/2024   TRIG 251.0 (H) 03/03/2024   HDL 32.50 (L) 03/03/2024   CHOLHDL 4 03/03/2024   VLDL 50.2 (H) 03/03/2024   LDLCALC 53 03/03/2024   LDLCALC 130 (H) 08/01/2023   Lab Results  Component Value Date   TSH 1.39 09/19/2023    Therapeutic Level Labs: No results found for: LITHIUM No results found for: CBMZ No results found for: VALPROATE  Screenings:  GAD-7    Flowsheet Row Counselor from 03/25/2024 in [facility] at [facility] from 01/16/2024 in [facility] at [facility] Counselor from 12/26/2023 in [facility] at [place] Counselor from 12/05/2023 in [facility] at [facility] Visit from 11/22/2023 in [facility] [employer]  Total GAD-7 Score 18 14 17 16 19    PHQ2-9    Flowsheet Row Counselor from 03/25/2024 in [facility] at [facility] Visit from 02/18/2024 in [facility] from 01/16/2024 in [facility] at [facility] from 12/26/2023 in [facility] at [facility] Visit from 12/24/2023 in [facility]  Total Score 6 6 6 6 6   PHQ-9 Total Score 24 -- 25 20 --   Flowsheet Row ED from 01/26/2024 in [facility] Department at [facility] Counselor from 12/05/2023 in [facility]  Outpatient Behavioral Health at [name] ED to Hosp-Admission (Discharged) from 10/11/2023 in [facility]  SURGICAL  C-SSRS RISK CATEGORY No Risk Low Risk No Risk    Collaboration of Care: Collaboration of Care:   Patient/Guardian was advised Release of Information must be obtained prior to any record release in order to collaborate their care with an outside provider. Patient/Guardian was advised if they have not already done so to contact the registration department to sign all necessary forms in order for us  to release information regarding their care.   Consent: Patient/Guardian gives verbal consent for treatment and assignment of benefits for services provided during this visit. Patient/Guardian expressed understanding and agreed to proceed.   Prentice Espy, MD 9/26/20259:36 AM

## 2024-03-29 MED ORDER — FAMOTIDINE 40 MG PO TABS
40.0000 mg | ORAL_TABLET | Freq: Every day | ORAL | 0 refills | Status: DC
  Filled 2024-03-29: qty 60, 60d supply, fill #0

## 2024-03-29 MED ORDER — FINASTERIDE 5 MG PO TABS
5.0000 mg | ORAL_TABLET | Freq: Every day | ORAL | 1 refills | Status: DC
  Filled 2024-03-29: qty 30, 30d supply, fill #0
  Filled 2024-04-13 – 2024-05-19 (×4): qty 30, 30d supply, fill #1

## 2024-03-30 ENCOUNTER — Other Ambulatory Visit: Payer: Self-pay | Admitting: Podiatry

## 2024-03-30 ENCOUNTER — Other Ambulatory Visit (HOSPITAL_COMMUNITY): Payer: Self-pay

## 2024-03-30 ENCOUNTER — Other Ambulatory Visit: Payer: Self-pay | Admitting: Family Medicine

## 2024-03-30 ENCOUNTER — Other Ambulatory Visit: Payer: Self-pay

## 2024-03-30 DIAGNOSIS — M5010 Cervical disc disorder with radiculopathy, unspecified cervical region: Secondary | ICD-10-CM

## 2024-03-31 ENCOUNTER — Other Ambulatory Visit (HOSPITAL_COMMUNITY): Payer: Self-pay

## 2024-03-31 ENCOUNTER — Other Ambulatory Visit: Payer: Self-pay

## 2024-03-31 MED ORDER — CELECOXIB 200 MG PO CAPS
200.0000 mg | ORAL_CAPSULE | Freq: Two times a day (BID) | ORAL | 0 refills | Status: DC | PRN
  Filled 2024-03-31: qty 60, 30d supply, fill #0

## 2024-03-31 MED ORDER — CYCLOBENZAPRINE HCL 5 MG PO TABS
5.0000 mg | ORAL_TABLET | Freq: Three times a day (TID) | ORAL | 0 refills | Status: DC | PRN
  Filled 2024-03-31: qty 60, 10d supply, fill #0

## 2024-04-01 ENCOUNTER — Other Ambulatory Visit (HOSPITAL_COMMUNITY): Payer: Self-pay

## 2024-04-02 ENCOUNTER — Other Ambulatory Visit: Payer: Self-pay | Admitting: Family

## 2024-04-02 ENCOUNTER — Ambulatory Visit: Payer: MEDICAID | Admitting: Family Medicine

## 2024-04-02 ENCOUNTER — Encounter: Payer: Self-pay | Admitting: Family Medicine

## 2024-04-02 ENCOUNTER — Other Ambulatory Visit (HOSPITAL_COMMUNITY): Payer: Self-pay

## 2024-04-02 VITALS — BP 143/100 | HR 82 | Temp 97.0°F | Resp 18 | Wt 247.8 lb

## 2024-04-02 DIAGNOSIS — I10 Essential (primary) hypertension: Secondary | ICD-10-CM

## 2024-04-02 DIAGNOSIS — K529 Noninfective gastroenteritis and colitis, unspecified: Secondary | ICD-10-CM

## 2024-04-02 DIAGNOSIS — B20 Human immunodeficiency virus [HIV] disease: Secondary | ICD-10-CM | POA: Diagnosis not present

## 2024-04-02 DIAGNOSIS — M722 Plantar fascial fibromatosis: Secondary | ICD-10-CM

## 2024-04-02 DIAGNOSIS — E1121 Type 2 diabetes mellitus with diabetic nephropathy: Secondary | ICD-10-CM

## 2024-04-02 DIAGNOSIS — E559 Vitamin D deficiency, unspecified: Secondary | ICD-10-CM

## 2024-04-02 LAB — CBC WITH DIFFERENTIAL/PLATELET
Basophils Absolute: 0 K/uL (ref 0.0–0.1)
Basophils Relative: 0.3 % (ref 0.0–3.0)
Eosinophils Absolute: 0.1 K/uL (ref 0.0–0.7)
Eosinophils Relative: 0.8 % (ref 0.0–5.0)
HCT: 41.5 % (ref 39.0–52.0)
Hemoglobin: 13.9 g/dL (ref 13.0–17.0)
Lymphocytes Relative: 39.8 % (ref 12.0–46.0)
Lymphs Abs: 3.5 K/uL (ref 0.7–4.0)
MCHC: 33.4 g/dL (ref 30.0–36.0)
MCV: 87.3 fl (ref 78.0–100.0)
Monocytes Absolute: 0.5 K/uL (ref 0.1–1.0)
Monocytes Relative: 6 % (ref 3.0–12.0)
Neutro Abs: 4.7 K/uL (ref 1.4–7.7)
Neutrophils Relative %: 53.1 % (ref 43.0–77.0)
Platelets: 223 K/uL (ref 150.0–400.0)
RBC: 4.75 Mil/uL (ref 4.22–5.81)
RDW: 14.7 % (ref 11.5–15.5)
WBC: 8.8 K/uL (ref 4.0–10.5)

## 2024-04-02 LAB — SEDIMENTATION RATE: Sed Rate: 32 mm/h — ABNORMAL HIGH (ref 0–15)

## 2024-04-02 NOTE — Patient Instructions (Addendum)
 [facility] [address] [place] [id number] [phone]  VISIT SUMMARY: You visited us  today for chronic abdominal pain and diarrhea, along with follow-ups for diabetes, hypertension, GERD, and plantar fasciitis. We discussed various tests and lifestyle changes to help manage your symptoms and conditions.  YOUR PLAN: CHRONIC DIARRHEA WITH ABDOMINAL PAIN: You have been experiencing chronic diarrhea and abdominal pain, though the pain has improved. We need to investigate further to identify the cause. -Order stool studies to check for infection, inflammatory bowel disease, and other causes. -Order a blood test for celiac disease. -Recheck your thyroid  function. -Contact gastroenterology for follow-up. -You can use over-the-counter loperamide  for severe diarrhea. -Avoid carbonated beverages to reduce bloating. -Try simethicone  (Gas-X) for bloating. -Keep a food journal to identify potential triggers. -Consider using probiotics for gut health. -If you consume dairy, use lactase enzyme to help with lactose intolerance.  HYPERTENSION: Your blood pressure is currently high, possibly due to not taking your medication regularly and steroid use for plantar fasciitis. -Continue taking your current blood pressure medications: olmesartan , amlodipine , and hydrochlorothiazide . -Make sure to take your medication as prescribed.  TYPE 2 DIABETES MELLITUS: Your diabetes was well-controlled at your last visit. -Follow up in two months to manage your diabetes.  PLANTAR FASCIITIS: You have ongoing pain from plantar fasciitis despite using a boot and receiving steroid injections. -Continue follow-up with podiatry. -We may refer you to pain management if your symptoms persist.  GASTROESOPHAGEAL REFLUX DISEASE (GERD): Your GERD is being managed with omeprazole and you have no current symptoms. -Continue taking omeprazole 40 mg as prescribed.

## 2024-04-02 NOTE — Progress Notes (Signed)
 Assessment & Plan   Assessment/Plan:    Assessment & Plan Chronic diarrhea with abdominal pain Chronic diarrhea with improved abdominal pain. CT scan showed no acute abnormalities. Differential includes IBS, celiac disease, infection, inflammatory bowel disease, and small intestinal bacterial overgrowth. No blood in stool, and stool is brown/green and sometimes mushy. No specific food triggers identified, but possible lactose intolerance noted with rich ice cream.  Patient with a history of HIV.  Recently well-controlled, however concern for possible GI infection due to suppressed immune system - Order stool studies to evaluate for infection, inflammatory bowel disease, and other causes - Order blood test for celiac disease - Recheck thyroid  function - Screen for anemia and assess total lymphocyte count with CBC - Provide contact information for gastroenterology for follow-up - Recommend over-the-counter loperamide  for diarrhea if severe - Advise avoiding carbonated beverages to reduce bloating - Suggest trying simethicone  (Gas-X) for bloating - Recommend keeping a food journal to identify potential triggers - Discuss potential use of probiotics for gut health - Educate on lactose intolerance and use of lactase enzyme if consuming dairy  Hypertension Hypertension with current blood pressure of 146/102 mmHg. Non-compliance with medication noted. Current medications include olmesartan , amlodipine , and hydrochlorothiazide . Elevated blood pressure possibly due to non-compliance, NSAID, and steroid use for plantar fasciitis. - Continue current antihypertensive regimen with olmesartan , amlodipine , and hydrochlorothiazide  - Encourage medication compliance  Type 2 diabetes mellitus Type 2 diabetes mellitus, previously well-controlled with HbA1c of 6.3%. - Follow up in two months for diabetic management - Referred for diabetic eye exam  Plantar fasciitis Chronic plantar fasciitis with  ongoing pain despite use of a boot and steroid injections. No improvement reported. - Continue follow-up with podiatry - Consider referral to pain managem complaint ent if symptoms persist  Gastroesophageal reflux disease (GERD) GERD managed with omeprazole 40 mg. No current symptoms of heartburn reported. - Continue omeprazole 40 mg as prescribed      There are no discontinued medications.    Return if symptoms worsen or fail to improve.        Subjective:   Encounter date: 04/02/2024  Phillip Gill is a 48 y.o. male who has Anxiety; Other constipation; Anxiety and depression; Fibromyalgia muscle pain; Anal condyloma; Gynecomastia; HTN (hypertension); Chronic hepatitis B (HCC); LGV (lymphogranuloma venereum); Primary syphilis; Gastroenteritis; HIV (human immunodeficiency virus infection) (HCC); Tobacco abuse; Overweight; Chronic pain associated with significant psychosocial dysfunction; Gonorrhea; Human immunodeficiency virus (HIV) disease (HCC); Foot lesion; Hepatitis B surface antigen positive; Healthcare maintenance; Erectile dysfunction; Cervical pain; Human monkeypox; Encounter for physical examination related to employment; Snoring; Urinary frequency; Left foot pain; Poor social situation; MDD (major depressive disorder); Passive suicidal ideations; OSA on CPAP; MDD (major depressive disorder), recurrent severe, without psychosis (HCC); Gastroesophageal reflux disease; Neck mass; Nocturia; Carpal tunnel syndrome on both sides; Hyponatremia; AKI (acute kidney injury); Type 2 diabetes mellitus with hyperglycemia (HCC); 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   He  has a past medical history of Anxiety, Arthritis, At increased risk for cardiovascular disease (11/21/2023), Chronic hepatitis B (HCC), Chronic  lower back pain, Depression, Drug-induced nausea and vomiting (02/06/2024), Fibromyalgia, Genital warts, HIV disease (HCC) (02/28/2015), HIV infection (HCC), Hypertension, IBS (irritable bowel syndrome), Migraine, Overweight (07/20/2015), and Renal insufficiency (12/29/2013).[name]   He presents with chief complaint of Medical Management of Chronic Issues (1 month follow up. Pt is fasting today //  HM due vaccinations and diabetic eye exam (referral is needed) ) and Plantar Fasciitis (Pt seen [facility] 03/09/2024 for left foot pain; pt is currently wearing a boot and received  0.5 cc Celestone  Soluspan injection with Mobic  15MG  and  Medrol  Dosepak. ) .   Discussed the use of AI scribe software for clinical note transcription with the patient, who gave verbal consent to proceed.  History of Present Illness 02/18/2024  Phillip Gill is a 48 year old male with hypertension and diabetes who presents for follow-up.  He has experienced improvement in nausea and vomiting after his psychiatrist adjusted his psychiatric medication, reducing bupropion  from 150 mg XR to 100 mg sustained release.  His blood pressure was measured at 154/100 mmHg. He is currently taking indapamide  1.25 mg and olmesartan  40 mg for hypertension. He experiences left-sided abdominal discomfort, described as a constant pain for about a month, which worsens with activity. No chest pain, shortness of breath, diarrhea, or constipation. He has not had any trauma to the area and does not take any medication for the pain.  For diabetes, he is on semaglutide  2 mg weekly. His last A1c was 10.3%.  He has a history of GERD with esophagitis, managed with esomeprazole  40 mg in the morning and famotidine  40 mg in the evening. No chest pain or shortness of breath.  He underwent a colonoscopy in December, which did not reveal significant findings, and an EGD showed mild reflux esophagitis. A CT scan from a few years ago showed  moderate wall thickening and pericolonic edema consistent with colitis, but no diverticulitis was noted.  His weight has been stable over the past three months, and his BMI is 33.  03/03/2024  Phillip Gill is a 48 year old male with cervical spondylosis who presents with left arm pain.  He has left arm pain that began yesterday, starting in the shoulder blade and radiating down the arm. The pain is worsening and associated with neck discomfort. He has not taken any medication for it, and the pain is not improving.  He has a history of cervical spondylosis with multilevel foraminal narrowing, including moderate right C5 foraminal stenosis and severe left foraminal stenosis. He has experienced similar neck and arm pain in the past.  He has a history of hypertension, diabetes, and hyperlipidemia. His diabetes management has improved significantly, with his A1c dropping from 10.3% to 6.3%. He is currently on a medication regimen of olmesartan , amlodipine , and hydrochlorothiazide , which has improved his adherence. He takes rosuvastatin  10 mg for hyperlipidemia.  He reports chronic abdominal pain, primarily in the left upper quadrant, which has eased somewhat. He is scheduled for a CT scan today to further assess this pain. He has a history of GERD and takes omeprazole 40 mg daily. He experiences frequent loose stools, which he does not classify as diarrhea.  He has a history of plantar fasciitis, which has flared up again at the site of previous surgery four years ago.  04/02/2024  Phillip Gill is a 48 year old male with diabetes and hypertension who presents with chronic abdominal pain and diarrhea.  Chronic abdominal pain and diarrhea - Chronic abdominal pain, previously most prominent in the left upper quadrant, now improved with no current pain in that area - Persistent diarrhea with bowel movements three to four times daily - Stool is brown and green, sometimes mushy,  mostly sinks - No blood in stool - Bloating and occasional  crampy sensation with eating - No recent use of medication for diarrhea - No regular consumption of probiotics or yogurt - History of issues with rich ice cream, suggesting possible lactose intolerance, but no recent episodes  Diabetes mellitus - Diabetes well controlled at last visit with hemoglobin A1c of 6.3  Hypertension - Current blood pressure 146/102 mmHg - Non-compliance with antihypertensive medication (olmesartan /amlodipine /hydrochlorothiazide  combination pill)  Gastroesophageal reflux disease (gerd) - GERD managed with omeprazole 40 mg  Plantar fasciitis - Currently wearing a boot and receiving steroid injections - No improvement with boot  Recent laboratory and imaging findings - Recent comprehensive metabolic panel, complete blood count, lipid panel, and vitamin D  levels all within normal limits - Recent CT scan showed no acute abnormalities     ROS  Past Surgical History:  Procedure Laterality Date   CO2 LASER APPLICATION N/A 02/11/2013   Procedure: CO2 LASER APPLICATION;  Surgeon: Bernarda Ned, MD;  Location: [facility];  Service: General;  Laterality: N/A;   FOOT SURGERY     HIGH RESOLUTION ANOSCOPY N/A 02/11/2013   Procedure: HIGH RESOLUTION ANOSCOPY WITH BIOPSY, LASER ABLATION;  Surgeon: Bernarda Ned, MD;  Location: [facility];  Service: General;  Laterality: N/A;   WISDOM TOOTH EXTRACTION      Outpatient Medications Prior to Visit  Medication Sig Dispense Refill   ARIPiprazole  (ABILIFY ) 5 MG tablet Take 1 tablet (5 mg total) by mouth daily. 90 tablet 0   bictegravir-emtricitabine -tenofovir  AF (BIKTARVY ) 50-200-25 MG TABS tablet TAKE 1 TABLET BY MOUTH EVERY DAY 30 tablet 5   Blood Glucose Monitoring Suppl (BLOOD GLUCOSE MONITOR SYSTEM) w/Device KIT Use to check blood sugar in the morning, at noon, and at bedtime. 1 kit 0   buPROPion  ER (WELLBUTRIN  SR) 100 MG 12  hr tablet Take 1 tablet (100 mg total) by mouth daily. 90 tablet 0   celecoxib  (CELEBREX ) 200 MG capsule Take 1 capsule (200 mg total) by mouth 2 (two) times daily as needed (pain). Take with food 60 capsule 0   Continuous Glucose Sensor (FREESTYLE LIBRE 3 SENSOR) MISC Place 1 sensor on the skin every 14 days. Use to check glucose continuously 2 each 4   cyclobenzaprine  (FLEXERIL ) 5 MG tablet Take 1-2 tablets (5-10 mg total) by mouth 3 (three) times daily as needed for muscle spasms. 60 tablet 0   diclofenac  Sodium (VOLTAREN ) 1 % GEL Apply 4 grams topically 4 (four) times daily to affected joint. 100 g 11   Elastic Bandages & Supports (CARPAL TUNNEL WRIST STABILIZER) MISC 1 each by Does not apply route as needed. 1 each 0   Elastic Bandages & Supports (MEDICAL COMPRESSION STOCKINGS) MISC 1 each by Does not apply route daily. 1 each 0   escitalopram  (LEXAPRO ) 20 MG tablet Take 1 tablet (20 mg total) by mouth daily. 90 tablet 0   esomeprazole  (NEXIUM ) 40 MG capsule Take 1 capsule (40 mg total) by mouth 2 (two) times daily before a meal. 180 capsule 3   famotidine  (PEPCID ) 40 MG tablet Take 1 tablet (40 mg total) by mouth at bedtime. 60 tablet 0   finasteride  (PROSCAR ) 5 MG tablet Take 1 tablet (5 mg total) by mouth daily. 30 tablet 1   meloxicam  (MOBIC ) 15 MG tablet Take 1 tablet (15 mg total) by mouth daily. 60 tablet 1   methylPREDNISolone  (MEDROL  DOSEPAK) 4 MG TBPK tablet 6 day dose pack - take as directed 21 tablet 0   Olmesartan -amLODIPine -HCTZ 40-10-25 MG TABS Take 1 tablet  by mouth daily. 90 tablet 3   pregabalin  (LYRICA ) 75 MG capsule Take 1 capsule (75 mg total) by mouth 2 (two) times daily as needed (nerve pain). 60 capsule 3   rosuvastatin  (CRESTOR ) 10 MG tablet Take 1 tablet (10 mg total) by mouth daily. 30 tablet 5   Semaglutide , 2 MG/DOSE, 8 MG/3ML SOPN Inject 2 mg as directed once a week. 9 mL 0   sildenafil  (VIAGRA ) 100 MG tablet Take 0.5-1 tablets (50-100 mg total) by mouth daily as  needed for erectile dysfunction. Do not exceed 100 mg in a day 30 tablet 1   Vitamin D , Ergocalciferol , (DRISDOL ) 1.25 MG (50000 UNIT) CAPS capsule Take 1 capsule (50,000 Units total) by mouth every 7 (seven) days. 8 capsule 0   No facility-administered medications prior to visit.    Family History  Problem Relation Age of Onset   Hypertension Mother    Diabetes Mother    Stroke Mother        cerbral aneurysm   Pancreatic cancer Mother    Colon cancer Father    Hypertension Brother    Sleep apnea Cousin    Mental illness Neg Hx    Stomach cancer Neg Hx    Esophageal cancer Neg Hx     Social History   Socioeconomic History   Marital status: Single    Spouse name: Not on file   Number of children: Not on file   Years of education: Not on file   Highest education level: Some college, no degree  Occupational History   Not on file  Tobacco Use   Smoking status: Former    Current packs/day: 0.00    Average packs/day: 1 pack/day for 18.0 years (18.0 ttl pk-yrs)    Types: Cigarettes    Start date: 07/03/2003    Quit date: 07/02/2021    Years since quitting: 2.7    Passive exposure: Never   Smokeless tobacco: Former  Advertising account planner   Vaping status: Former   Quit date: 12/01/2022   Substances: Nicotine   Substance and Sexual Activity   Alcohol use: Yes    Comment: occasional use   Drug use: No   Sexual activity: Not Currently    Partners: Male    Comment: accepted condoms  Other Topics Concern   Not on file  Social History Narrative   Grew up in [place], now living in [place],-  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    Finished HS.   Doing online classes with [employer]- studying accounting   Previously 2 years of classes at [employer]    Has Partner- [name]- together for 1 year.    Has 2 girls- (born in 2000).          Social Drivers of Health   Financial Resource Strain: Medium Risk (02/02/2024)   Overall Financial Resource Strain (CARDIA)     Difficulty of Paying Living Expenses: Somewhat hard  Food Insecurity: Food Insecurity Present (02/02/2024)   Hunger Vital Sign    Worried About Running Out of Food in the Last Year: Sometimes true    Ran Out of Food in the Last Year: Sometimes true  Transportation Needs: No Transportation Needs (02/02/2024)   PRAPARE - Administrator, Civil Service (Medical): No    Lack of Transportation (Non-Medical): No  Physical Activity: Inactive (02/02/2024)   Exercise Vital Sign    Days of Exercise per Week: 0 days    Minutes of Exercise per Session: Not on  file  Stress: Stress Concern Present (02/02/2024)   [employer] of Occupational Health - Occupational Stress Questionnaire    Feeling of Stress: Rather much  Social Connections: Moderately Integrated (02/02/2024)   Social Connection and Isolation Panel    Frequency of Communication with Friends and Family: Three times a week    Frequency of Social Gatherings with Friends and Family: Three times a week    Attends Religious Services: More than 4 times per year    Active Member of Clubs or Organizations: Yes    Attends Banker Meetings: More than 4 times per year    Marital Status: Never married  Intimate Partner Violence: Not At Risk (10/12/2023)   Humiliation, Afraid, Rape, and Kick questionnaire    Fear of Current or Ex-Partner: No    Emotionally Abused: No    Physically Abused: No    Sexually Abused: No                                                                                                  Objective:  Physical Exam: BP (!) 143/100 (BP Location: Left Arm, Patient Position: Sitting, Cuff Size: Large) Comment: recheck after resting  Pulse 82   Temp (!) 97 F (36.1 C) (Temporal)   Resp 18   Wt 247 lb 12.8 oz (112.4 kg)   SpO2 97%   BMI 33.61 kg/m   Wt Readings from Last 3 Encounters:  04/02/24 247 lb 12.8 oz (112.4 kg)  03/09/24 242 lb 6.4 oz (110 kg)  03/03/24 242 lb 6.4 oz (110 kg)    Physical  Exam GENERAL: Alert, cooperative, well developed, no acute distress. HEENT: Normocephalic, normal oropharynx, moist mucous membranes. CHEST: Clear to auscultation bilaterally, no wheezes, rhonchi, or crackles. CARDIOVASCULAR: Normal heart rate and rhythm, S1 and S2 normal without murmurs. ABDOMEN: Soft, mild tenderness, distended and taunt, without organomegaly, normal bowel sounds. EXTREMITIES: No cyanosis, edema, or leg swelling. NEUROLOGICAL: Cranial nerves grossly intact, moves all extremities without gross motor or sensory deficit.   Physical Exam  DG Cervical Spine 2 or 3 views Result Date: 03/18/2024 CLINICAL DATA:  Left C7 radicular pain. EXAM: CERVICAL SPINE - 2-3 VIEW COMPARISON:  Cervical spine radiograph 12/19/2022 FINDINGS: Straightening of the normal cervical lordosis. Preservation of the vertebral body heights. Degenerative disc disease most pronounced C4-5, C5-6 and C6-7. Prevertebral soft tissues unremarkable. Lateral masses articulate appropriately with the dens. Lung apices clear. IMPRESSION: Degenerative disc disease most pronounced C4-5, C5-6 and C6-7. Electronically Signed   By: Bard Moats M.D.   On: 03/18/2024 21:08   DG Foot Complete Left Result Date: 03/09/2024 Please see detailed radiograph report in office note.  CT ABDOMEN PELVIS W WO CONTRAST Result Date: 03/05/2024 CLINICAL DATA:  Left upper quadrant abdominal pain for 1 month EXAM: CT ABDOMEN AND PELVIS WITHOUT AND WITH CONTRAST TECHNIQUE: Multidetector CT imaging of the abdomen and pelvis was performed following the standard protocol before and following the bolus administration of intravenous contrast. RADIATION DOSE REDUCTION: This exam was performed according to the departmental dose-optimization program which includes automated exposure control, adjustment of the  mA and/or kV according to patient size and/or use of iterative reconstruction technique. CONTRAST:  85mL OMNIPAQUE  IOHEXOL  300 MG/ML  SOLN COMPARISON:   12/30/2019 FINDINGS: Lower chest: No acute abnormality. Hepatobiliary: No solid liver abnormality is seen. Hepatic steatosis. No gallstones, gallbladder wall thickening, or biliary dilatation. Pancreas: Unremarkable. No pancreatic ductal dilatation or surrounding inflammatory changes. Spleen: Normal in size without significant abnormality. Adrenals/Urinary Tract: Adrenal glands are unremarkable. Kidneys are normal, without renal calculi, solid lesion, or hydronephrosis. Bladder is unremarkable. Stomach/Bowel: Stomach is within normal limits. Appendix appears normal. No evidence of bowel wall thickening, distention, or inflammatory changes. Vascular/Lymphatic: Aortic atherosclerosis. No enlarged abdominal or pelvic lymph nodes. Reproductive: No mass or other significant abnormality. Other: No abdominal wall hernia or abnormality. No ascites. Musculoskeletal: No acute or significant osseous findings. IMPRESSION: 1. No acute CT findings of the abdomen or pelvis to explain left upper quadrant pain. 2. Hepatic steatosis. Aortic Atherosclerosis (ICD10-I70.0). Electronically Signed   By: Marolyn JONETTA Jaksch M.D.   On: 03/05/2024 22:01   DG Chest 2 View Result Date: 01/26/2024 CLINICAL DATA:  Chest pain.  Panic attack. EXAM: CHEST - 2 VIEW COMPARISON:  10/20/2022 FINDINGS: The cardiac silhouette, mediastinal and hilar contours are normal. The lungs are clear. No pleural effusions or pneumothorax. No pulmonary lesions. The bony thorax is intact. IMPRESSION: No acute cardiopulmonary findings. Electronically Signed   By: MYRTIS Stammer M.D.   On: 01/26/2024 21:57    Recent Results (from the past 2160 hours)  Basic metabolic panel     Status: Abnormal   Collection Time: 01/26/24  9:50 PM  Result Value Ref Range   Sodium 138 135 - 145 mmol/L   Potassium 3.4 (L) 3.5 - 5.1 mmol/L   Chloride 103 98 - 111 mmol/L   CO2 27 22 - 32 mmol/L   Glucose, Bld 107 (H) 70 - 99 mg/dL    Comment: Glucose reference range applies only to  samples taken after fasting for at least 8 hours.   BUN 15 6 - 20 mg/dL   Creatinine, Ser 8.67 (H) 0.61 - 1.24 mg/dL   Calcium  9.1 8.9 - 10.3 mg/dL   GFR, Estimated >39 >39 mL/min    Comment: (NOTE) Calculated using the CKD-EPI Creatinine Equation (2021)    Anion gap 8 5 - 15    Comment: Performed at [facility], [address] [place], [place] [address]  CBC     Status: Abnormal   Collection Time: 01/26/24  9:50 PM  Result Value Ref Range   WBC 12.4 (H) 4.0 - 10.5 K/uL   RBC 4.78 4.22 - 5.81 MIL/uL   Hemoglobin 14.0 13.0 - 17.0 g/dL   HCT 57.6 60.9 - 47.9 %   MCV 88.5 80.0 - 100.0 fL   MCH 29.3 26.0 - 34.0 pg   MCHC 33.1 30.0 - 36.0 g/dL   RDW 86.4 88.4 - 84.4 %   Platelets 245 150 - 400 K/uL   nRBC 0.0 0.0 - 0.2 %    Comment: Performed at [facility], [address] [place], [place] [address]  Troponin I (High Sensitivity)     Status: None   Collection Time: 01/26/24  9:50 PM  Result Value Ref Range   Troponin I (High Sensitivity) 5 <18 ng/L    Comment: (NOTE) Elevated high sensitivity troponin I (hsTnI) values and significant  changes across serial measurements may suggest ACS but many other  chronic and acute conditions are known to elevate hsTnI results.  Refer to the  Links section for chest pain algorithms and additional  guidance. Performed at [facility], [address], [place], [place] [address]   Troponin I (High Sensitivity)     Status: None   Collection Time: 01/27/24 12:07 AM  Result Value Ref Range   Troponin I (High Sensitivity) 5 <18 ng/L    Comment: (NOTE) Elevated high sensitivity troponin I (hsTnI) values and significant  changes across serial measurements may suggest ACS but many other  chronic and acute conditions are known to elevate hsTnI results.  Refer to the Links section for chest pain algorithms and additional  guidance. Performed at [facility], [address] [place], [place] [address]   POC COVID-19 BinaxNow     Status: Normal    Collection Time: 02/06/24  9:25 AM  Result Value Ref Range   SARS Coronavirus 2 Ag Negative Negative  POCT glycosylated hemoglobin (Hb A1C)     Status: Abnormal   Collection Time: 02/18/24  9:53 AM  Result Value Ref Range   Hemoglobin A1C 6.3 (A) 4.0 - 5.6 %   HbA1c POC (<> result, manual entry)     HbA1c, POC (prediabetic range)     HbA1c, POC (controlled diabetic range)    Lipid panel     Status: Abnormal   Collection Time: 03/03/24 10:35 AM  Result Value Ref Range   Cholesterol 135 0 - 200 mg/dL    Comment: ATP III Classification       Desirable:  < 200 mg/dL               Borderline High:  200 - 239 mg/dL          High:  > = 759 mg/dL   Triglycerides 748.9 (H) 0.0 - 149.0 mg/dL    Comment: Normal:  <849 mg/dLBorderline High:  150 - 199 mg/dL   HDL 67.49 (L) >60.99 mg/dL   VLDL 49.7 (H) 0.0 - 59.9 mg/dL   LDL Cholesterol 53 0 - 99 mg/dL   Total CHOL/HDL Ratio 4     Comment:                Men          Women1/2 Average Risk     3.4          3.3Average Risk          5.0          4.42X Average Risk          9.6          7.13X Average Risk          15.0          11.0                       NonHDL 102.92     Comment: NOTE:  Non-HDL goal should be 30 mg/dL higher than patient's LDL goal (i.e. LDL goal of < 70 mg/dL, would have non-HDL goal of < 100 mg/dL)  Comp Met (CMET)     Status: Abnormal   Collection Time: 03/03/24 10:35 AM  Result Value Ref Range   Sodium 140 135 - 145 mEq/L   Potassium 3.4 (L) 3.5 - 5.1 mEq/L   Chloride 101 96 - 112 mEq/L   CO2 29 19 - 32 mEq/L   Glucose, Bld 110 (H) 70 - 99 mg/dL   BUN 22 6 - 23 mg/dL   Creatinine, Ser 8.53 0.40 - 1.50 mg/dL  Total Bilirubin 0.3 0.2 - 1.2 mg/dL   Alkaline Phosphatase 76 39 - 117 U/L   AST 23 0 - 37 U/L   ALT 23 0 - 53 U/L   Total Protein 8.2 6.0 - 8.3 g/dL   Albumin 4.3 3.5 - 5.2 g/dL   GFR 43.14 (L) >39.99 mL/min    Comment: Calculated using the CKD-EPI Creatinine Equation (2021)   Calcium  9.6 8.4 - 10.5 mg/dL   Vitamin D  (25 hydroxy)     Status: None   Collection Time: 03/03/24 10:35 AM  Result Value Ref Range   VITD 35.24 30.00 - 100.00 ng/mL        Beverley Adine Hummer, MD, MS

## 2024-04-03 LAB — TISSUE TRANSGLUTAMINASE, IGA: (tTG) Ab, IgA: <1 U/mL

## 2024-04-03 LAB — THYROID PANEL WITH TSH
Free Thyroxine Index: 2.5 (ref 1.4–3.8)
T3 Uptake: 30 % (ref 22–35)
T4, Total: 8.2 ug/dL (ref 4.9–10.5)
TSH: 1.83 m[IU]/L (ref 0.40–4.50)

## 2024-04-03 LAB — GLIADIN ANTIBODIES, SERUM
Gliadin IgA: <1 U/mL
Gliadin IgG: <1 U/mL

## 2024-04-03 NOTE — Progress Notes (Unsigned)
 Diabetes Self-Management Education  Visit Type: Follow-up  Appt. Start Time: 0907 Appt. End Time: 9056  04/10/2024  Phillip Gill, identified by name and date of birth, is a 48 y.o. male with a diagnosis of Diabetes:  .    ASSESSMENT  Patient is here today alone for follow Pt reports he removes his CGM for MRI. Pt denies recent hypoglycemia. Pt reports appropriate treatment options for hypoglycemia. Pt reports he is now eating out 1-2 weekly. Pt reports he is connected to therapy once monthly. Pt reports he is no longer working. Pt reports intake of 1-2 meals daily stating he always has dinner meal. All Pt's questions questions were answered during this encounter.   Hx: Patient reports he lives with his two aunts. Pt reports shared shopping and cooking and states his family relies on him to prepare meals. Pt reports family members also have dietary preferences. Pt reports he would like to move out into his own residence in the near future.   There were no vitals taken for this visit. There is no height or weight on file to calculate BMI.   Diabetes Self-Management Education - 04/10/24 0907       Visit Information   Visit Type Follow-up      Health Coping   How would you rate your overall health? Fair      Psychosocial Assessment   What is the hardest part about your diabetes right now, causing you the most concern, or is the most worrisome to you about your diabetes?   Being active;Making healty food and beverage choices    Self-care barriers Other (comment)   living situtation   Self-management support Doctor's office    Other persons present Patient    Patient Concerns Nutrition/Meal planning;Support    Special Needs None    Preferred Learning Style No preference indicated    Learning Readiness Contemplating      Complications   Last HgB A1C per patient/outside source 6.3 %    Number of hypoglycemic episodes per month 0    Are you checking your feet? Yes    How many  days per week are you checking your feet? 7      Dietary Intake   Breakfast fruit loops cereal, milk    Lunch fried chicken, bread    Dinner sour krat, sausage    Beverage(s) water, diet soda      Activity / Exercise   Activity / Exercise Type ADL's      Patient Education   Previous Diabetes Education Yes    Disease Pathophysiology Explored patient's options for treatment of their diabetes    Healthy Eating Plate Method;Reviewed blood glucose goals for pre and post meals and how to evaluate the patients' food intake on their blood glucose level.    Being Active Helped patient identify appropriate exercises in relation to his/her diabetes, diabetes complications and other health issue.    Monitoring Daily foot exams;Identified appropriate SMBG and/or A1C goals.    Acute complications Taught prevention, symptoms, and  treatment of hypoglycemia - the 15 rule.    Chronic complications Assessed and discussed foot care and prevention of foot problems    Diabetes Stress and Support Worked with patient to identify barriers to care and solutions    Lifestyle and Health Coping Lifestyle issues that need to be addressed for better diabetes care      Individualized Goals (developed by patient)   Nutrition Follow meal plan discussed    Physical Activity  Exercise 1-2 times per week    Medications take my medication as prescribed    Monitoring  Test my blood glucose as discussed    Problem Solving Addressing barriers to behavior change    Reducing Risk do foot checks daily;treat hypoglycemia with 15 grams of carbs if blood glucose less than 70mg /dL    Health Coping Ask for help with psychological, social, or emotional issues      Patient Self-Evaluation of Goals - Patient rates self as meeting previously set goals (% of time)   Nutrition >75% (most of the time)      Post-Education Assessment   Patient understands the diabetes disease and treatment process. Comprehends key points    Patient  understands incorporating nutritional management into lifestyle. Needs Review    Patient undertands incorporating physical activity into lifestyle. Needs Review    Patient understands using medications safely. Comphrehends key points    Patient understands monitoring blood glucose, interpreting and using results Comprehends key points    Patient understands prevention, detection, and treatment of acute complications. Demonstrates understanding / competency    Patient understands prevention, detection, and treatment of chronic complications. Demonstrates understanding / competency    Patient understands how to develop strategies to address psychosocial issues. Needs Review    Patient understands how to develop strategies to promote health/change behavior. Needs Review      Outcomes   Expected Outcomes Demonstrated interest in learning but significant barriers to change    Future DMSE Yearly    Program Status Completed      Subsequent Visit   Since your last visit have you continued or begun to take your medications as prescribed? Yes    Since your last visit have you experienced any weight changes? No change    Since your last visit, are you checking your blood glucose at least once a day? No          Individualized Plan for Diabetes Self-Management Training:   Learning Objective:  Patient will have a greater understanding of diabetes self-management. Patient education plan is to attend individual and/or group sessions per assessed needs and concerns.   Plan:   Patient Instructions  Aim to increase physical activity as tolerated 20-30 minutes daily; start with one day weekly with MD consent   Expected Outcomes:  Demonstrated interest in learning but significant barriers to change  Education material provided: My Plate and Diabetes Resources, BG targets  If problems or questions, patient to contact team via:  Phone  Future DSME appointment: Yearly

## 2024-04-06 ENCOUNTER — Ambulatory Visit: Payer: Self-pay | Admitting: Family Medicine

## 2024-04-06 DIAGNOSIS — R700 Elevated erythrocyte sedimentation rate: Secondary | ICD-10-CM | POA: Insufficient documentation

## 2024-04-07 ENCOUNTER — Encounter: Payer: Self-pay | Admitting: Sports Medicine

## 2024-04-08 ENCOUNTER — Encounter: Payer: Self-pay | Admitting: Podiatry

## 2024-04-08 ENCOUNTER — Ambulatory Visit (INDEPENDENT_AMBULATORY_CARE_PROVIDER_SITE_OTHER): Payer: MEDICAID | Admitting: Podiatry

## 2024-04-08 VITALS — Ht 72.0 in | Wt 247.8 lb

## 2024-04-08 DIAGNOSIS — M722 Plantar fascial fibromatosis: Secondary | ICD-10-CM

## 2024-04-08 NOTE — Progress Notes (Signed)
 Chief Complaint  Patient presents with   Plantar Fasciitis    Pt is here to f/u on left foot pain, he states the pain is still there, continues to wear cam boot, states medication prescribe ran his sugar and did not help much with the pain.    HPI: 48 y.o. male presenting today for follow-up evaluation of left arch pain.  Patient states that the injection and anti-inflammatory helped minimally.  He continues to have significant pain and tenderness.  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  Onset around the beginning of July 2025.  Progressively has gotten worse.  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 Efrain- sees him every 4 months   Hypertension    IBS (irritable bowel syndrome)    Migraine    none in years (02/16/2015   Overweight 07/20/2015   Renal insufficiency 12/29/2013    Past Surgical History:  Procedure Laterality Date   CO2 LASER APPLICATION N/A 02/11/2013   Procedure: CO2 LASER APPLICATION;  Surgeon: Bernarda Ned, MD;  Location: [facility];  Service: General;  Laterality: N/A;   FOOT SURGERY     HIGH RESOLUTION ANOSCOPY N/A 02/11/2013   Procedure: HIGH RESOLUTION ANOSCOPY WITH BIOPSY, LASER ABLATION;  Surgeon: Bernarda Ned, MD;  Location: [facility];  Service: General;  Laterality: N/A;   WISDOM TOOTH EXTRACTION      Allergies  Allergen Reactions   Bactrim [Sulfamethoxazole W/Trimethoprim (Co-Trimoxazole)] Hives and Shortness Of Breath   Sulfa Antibiotics Hives and Shortness Of Breath   Cymbalta   [Duloxetine  Hcl] Diarrhea   Duloxetine  Diarrhea   Metformin  And Related Nausea And Vomiting   Other Hives and Swelling    [employer] toothpaste    Pork-Derived Products Other (See Comments)    Pt refused to eat pork products.   Zoloft [Sertraline Hcl] Diarrhea   Clindamycin /Lincomycin Rash   Neurontin  [Gabapentin ] Rash     Physical Exam: General: The patient is alert and oriented x3 in no acute distress.  Dermatology: Skin is warm, dry and supple bilateral lower extremities.   Vascular: Palpable pedal pulses bilaterally. Capillary refill within normal limits.  No appreciable edema.  No erythema.  Neurological: Grossly intact via light touch  Musculoskeletal Exam: No pedal deformities noted.  Significant pain along the plantar arch just proximal to the sesamoid apparatus of the first MTP around the area where the plantar fibroma was removed  Radiographic Exam LT foot 03/09/2024:  Normal osseous mineralization. Joint spaces preserved.  No fractures or osseous irregularities noted.  Impression: Negative  Assessment/Plan of Care: 1.  Plantar fasciitis left 2.  History of excision of plantar fibroma LT foot.  DOS: 02/25/2020  -Patient evaluated.   - Continue meloxicam  15 mg daily -Continue WBAT in the cam boot -Unfortunately patient continues to have severe pain and tenderness to the plantar arch of the left foot.  This has now been ongoing for 3 months and complicated by history of excision of plantar  fibroma to the region as well.  I do believe MRI is appropriate at this time -MRI ordered LT foot wo contrast  -Return to clinic 1 week after MRI to review results and discuss further treatment options     Thresa EMERSON Sar, DPM [facility]  Dr. Thresa EMERSON Sar, DPM    [address] [place], [place] [address]                Office [phone]  Fax [phone]

## 2024-04-09 ENCOUNTER — Telehealth: Payer: Self-pay | Admitting: Adult Health

## 2024-04-09 ENCOUNTER — Other Ambulatory Visit: Payer: Self-pay

## 2024-04-09 ENCOUNTER — Encounter: Payer: Self-pay | Admitting: Podiatry

## 2024-04-09 LAB — CELIAC PNL 2 RFLX ENDOMYSIAL AB TTR
(tTG) Ab, IgA: <1 U/mL
(tTG) Ab, IgG: <1 U/mL
Endomysial Ab IgA: NEGATIVE
Gliadin IgA: <1 U/mL
Gliadin IgG: <1 U/mL
Immunoglobulin A: 161 mg/dL (ref 47–310)

## 2024-04-09 NOTE — Telephone Encounter (Signed)
 Attempted to repeat CPAP download as prior visit download showed elevated residual AHI at 21.2.  Order placed to follow-up with DME for mask refitting due to complaints of aerophagia contributing to CPAP noncompliance.  Pressure settings also adjusted.  He has been noncompliant with his CPAP since 8/10. MyChart message sent to follow up on this.

## 2024-04-10 ENCOUNTER — Encounter: Payer: MEDICAID | Attending: Family Medicine | Admitting: Dietician

## 2024-04-10 DIAGNOSIS — E1165 Type 2 diabetes mellitus with hyperglycemia: Secondary | ICD-10-CM | POA: Diagnosis present

## 2024-04-10 LAB — FECAL OCCULT BLOOD, IMMUNOCHEMICAL: Fecal Occult Bld: NEGATIVE

## 2024-04-10 NOTE — Patient Instructions (Addendum)
 Aim to increase physical activity as tolerated 20-30 minutes daily; start with one day weekly with MD consent

## 2024-04-12 LAB — GI PROFILE, STOOL, PCR

## 2024-04-13 ENCOUNTER — Other Ambulatory Visit: Payer: Self-pay

## 2024-04-13 ENCOUNTER — Other Ambulatory Visit (HOSPITAL_COMMUNITY): Payer: Self-pay

## 2024-04-13 ENCOUNTER — Other Ambulatory Visit: Payer: Self-pay | Admitting: Family Medicine

## 2024-04-13 DIAGNOSIS — M5010 Cervical disc disorder with radiculopathy, unspecified cervical region: Secondary | ICD-10-CM

## 2024-04-13 DIAGNOSIS — Z113 Encounter for screening for infections with a predominantly sexual mode of transmission: Secondary | ICD-10-CM

## 2024-04-13 DIAGNOSIS — B181 Chronic viral hepatitis B without delta-agent: Secondary | ICD-10-CM

## 2024-04-13 DIAGNOSIS — B20 Human immunodeficiency virus [HIV] disease: Secondary | ICD-10-CM

## 2024-04-14 ENCOUNTER — Other Ambulatory Visit (HOSPITAL_COMMUNITY): Payer: Self-pay

## 2024-04-14 ENCOUNTER — Other Ambulatory Visit: Payer: Self-pay

## 2024-04-16 ENCOUNTER — Other Ambulatory Visit (HOSPITAL_COMMUNITY): Payer: Self-pay

## 2024-04-16 ENCOUNTER — Other Ambulatory Visit: Payer: MEDICAID

## 2024-04-16 LAB — FECAL LACTOFERRIN, QUANT
Fecal Lactoferrin: POSITIVE — AB
MICRO NUMBER:: 17084557
SPECIMEN QUALITY:: ADEQUATE

## 2024-04-16 LAB — CALPROTECTIN: Calprotectin: 374 ug/g — ABNORMAL HIGH

## 2024-04-17 ENCOUNTER — Encounter (HOSPITAL_COMMUNITY): Payer: Self-pay

## 2024-04-20 ENCOUNTER — Other Ambulatory Visit: Payer: Self-pay

## 2024-04-21 ENCOUNTER — Other Ambulatory Visit: Payer: Self-pay | Admitting: Family Medicine

## 2024-04-21 ENCOUNTER — Other Ambulatory Visit: Payer: Self-pay | Admitting: Podiatry

## 2024-04-21 ENCOUNTER — Telehealth: Payer: Self-pay

## 2024-04-21 ENCOUNTER — Other Ambulatory Visit (HOSPITAL_COMMUNITY): Payer: Self-pay

## 2024-04-21 ENCOUNTER — Other Ambulatory Visit: Payer: MEDICAID

## 2024-04-21 DIAGNOSIS — E66811 Obesity, class 1: Secondary | ICD-10-CM

## 2024-04-21 DIAGNOSIS — T50905A Adverse effect of unspecified drugs, medicaments and biological substances, initial encounter: Secondary | ICD-10-CM

## 2024-04-21 DIAGNOSIS — E1165 Type 2 diabetes mellitus with hyperglycemia: Secondary | ICD-10-CM

## 2024-04-21 DIAGNOSIS — M5010 Cervical disc disorder with radiculopathy, unspecified cervical region: Secondary | ICD-10-CM

## 2024-04-21 NOTE — Telephone Encounter (Signed)
 PA for MRI that was done by [facility] has a preliminary denial. The insurance is requiring 4 weeks of exercises such as formal physical therapy, medically directed home exercise program or chiropractic treatments and it didn't get better.

## 2024-04-22 ENCOUNTER — Other Ambulatory Visit: Payer: MEDICAID

## 2024-04-22 ENCOUNTER — Other Ambulatory Visit: Payer: Self-pay

## 2024-04-22 ENCOUNTER — Ambulatory Visit (INDEPENDENT_AMBULATORY_CARE_PROVIDER_SITE_OTHER): Payer: MEDICAID | Admitting: Licensed Clinical Social Worker

## 2024-04-22 ENCOUNTER — Other Ambulatory Visit (HOSPITAL_COMMUNITY)
Admission: RE | Admit: 2024-04-22 | Discharge: 2024-04-22 | Disposition: A | Discharge status: Home / Self Care | Payer: MEDICAID | Source: Ambulatory Visit | Attending: Family | Admitting: Family

## 2024-04-22 ENCOUNTER — Encounter: Payer: Self-pay | Admitting: Pharmacist

## 2024-04-22 ENCOUNTER — Other Ambulatory Visit (HOSPITAL_COMMUNITY): Payer: Self-pay

## 2024-04-22 DIAGNOSIS — F332 Major depressive disorder, recurrent severe without psychotic features: Secondary | ICD-10-CM

## 2024-04-22 DIAGNOSIS — B20 Human immunodeficiency virus [HIV] disease: Secondary | ICD-10-CM | POA: Insufficient documentation

## 2024-04-22 DIAGNOSIS — Z113 Encounter for screening for infections with a predominantly sexual mode of transmission: Secondary | ICD-10-CM | POA: Diagnosis present

## 2024-04-22 DIAGNOSIS — B181 Chronic viral hepatitis B without delta-agent: Secondary | ICD-10-CM

## 2024-04-22 DIAGNOSIS — F411 Generalized anxiety disorder: Secondary | ICD-10-CM

## 2024-04-22 MED ORDER — METHYLPREDNISOLONE 4 MG PO TBPK
ORAL_TABLET | ORAL | 0 refills | Status: DC
  Filled 2024-04-22: qty 21, 6d supply, fill #0

## 2024-04-22 NOTE — Progress Notes (Signed)
 THERAPIST PROGRESS NOTE   Session Date: 04/22/2024  Session Time: 9083 - 1005  Participation Level: Active  MSE/Presentation: Behavior: Appropriate and Sharing Speech: Normal Thought Process: Coherent Cognition: Alert and Appropriate Mood: Negative and Dysphoric Affect: Depressed and Flat Insight: Lacking Appearance: Casual  Type of Therapy: Individual Therapy  Treatment Goals addressed:   Not Progressing (6) LTG: Reduce frequency, intensity, and duration of depression symptoms so that daily functioning is improved (OP Depression) LTG: Increase coping skills to manage depression and improve ability to perform daily activities (OP Depression) LTG: To not be depressed, have more energy, and stop over eating (OP Depression) STG: Report a decrease in anxiety symptoms as evidenced by an overall reduction in anxiety score by a minimum of 25% on the Generalized Anxiety Disorder Scale (GAD-7) (Anxiety) STG: Phillip Gill will reduce frequency of avoidant behaviors by 50% as evidenced by self-report in therapy sessions (Anxiety) LTG: Be a more people person Increase frequency at which pt is actively engaging in social activities. (Anxiety)  Progress Towards Goals: Not Progressing  Interventions: CBT, Solution Focused, and Supportive  Summary: Phillip Gill is a 48 y.o. male with past psych history of MDD and GAD, presenting for follow up therapy session in efforts to improve management of depressive and anxious symptoms.  Patient actively engaged in session, presenting in overall depressed moods, and congruent affect.  Patient actively engaged in introductory check-in, sharing of things being Not good, sharing of things at home still being the same, detailing continued stress surrounding aunt's needs and being bothersome. Pt detailed of having been experiencing depressed moods over recent days, having difficulties getting out of bed the last two days, finding household environment being a  contributing factor. Further processed increased time spent within the home as a result of discontinuing working, acknowledging increased exposure to household stressors, resulting in increased isolation as aims of avoiding interactions with aunt. Actively processed relationship between thoughts, feelings/emotions, and behaviors, processing the increased negative nature in thoughts proving to impact pt's overall moods. Explored potential interest in seeking alternate employment, with pt declining interest at this time and awaiting disability determination, sharing of having secured an attorney for support in the appeals process. Revisited potential benefits of pursuing greater support through higher LOC such as that of PHP, maintaining position of transportation and finances proving to be a barrier. Further explored Vit D correlation with poor moods and benefits of increasing time spent outside, proving resistant to doing so, sharing of finding self having panic attacks when getting too hot, processing ways in which pt can combat heat.     04/02/2024   11:34 AM 03/25/2024    9:32 AM 01/16/2024   10:19 AM 12/26/2023   10:20 AM  GAD 7 : Generalized Anxiety Score  Nervous, Anxious, on Edge 3 3 3 3   Control/stop worrying 3 3 3 3   Worry too much - different things 3 3 3 3   Trouble relaxing 2 3 0 1  Restless 2 0 1 3  Easily annoyed or irritable 3 3 3 3   Afraid - awful might happen 3 3 1 1   Total GAD 7 Score 19 18 14 17   Anxiety Difficulty Extremely difficult Extremely difficult Extremely difficult Extremely difficult      04/02/2024   11:34 AM 03/25/2024    9:34 AM 02/18/2024    9:25 AM 01/16/2024   10:53 AM 12/26/2023   10:21 AM  Depression screen PHQ 2/9  Decreased Interest 3 3 3 3 3   Down, Depressed, Hopeless 3  3 3 3 3   PHQ - 2 Score 6 6 6 6 6   Altered sleeping 3 3  3 3   Tired, decreased energy 3 3  3 3   Change in appetite 3 3  3  0  Feeling bad or failure about yourself  3 3  3 3   Trouble  concentrating 3 3  3 3   Moving slowly or fidgety/restless 3 3  1 1   Suicidal thoughts 3 0  3 1  PHQ-9 Score 27 24  25 20   Difficult doing work/chores Extremely dIfficult Extremely dIfficult  Extremely dIfficult Extremely dIfficult   Suicidal/Homicidal: None, No plan to harm self or others  Therapist Response:  Clinician actively greeted pt upon presenting for today's visit, assessing presenting moods and affect, exploring morning events and presenting moods. Engaged pt in introductory check-in, utilizing open ended questions in eliciting recounts of events of the past month, exploring newly observed/identified stressors, and factors contributing to presenting moods.  Utilized active listening techniques to provide support and validation of pt's expressed thoughts and feelings, further supporting in exploration of thoughts and feelings in relation to presenting stressors and implications on moods. Engaged pt in processing relationship between thoughts, feelings, and behaviors/actions, and how these further impact overall moods. Revisited discussion surrounding potential benefit of securing additional services via higher LOC, processing barriers. Clinician utilized CBT, MI, solution focused, psycho-ed, and supportive reflection interventions to support pt in navigation presenting sxs.   [x]  Cognitive Challenging [x]  Cognitive Refocusing [x]  Cognitive Reframing  [x]  Communication Skills []  Compliance Issues []  DBT [x]  Exploration of Coping Patterns [x]  Exploration of Emotions []  Exploration of Relationship Patterns  []  Guided Imagery []  Interactive Feedback []  Interpersonal Resolutions  []  Mindfulness Training [x]  Preventative Services [x]  Psycho-Education []  Relaxation/Deep Breathing []  Review of Treatment Plan/Progress []  Role-Play/Behavioral Rehearsal  [x]  Structured Problem Solving [x]  Supportive Reflection [x]  Symptom Management  []  Other   Patient responded well to interventions. Patient  continues to meet criteria for MDD and GAD. Patient will continue to benefit from engagement in outpatient therapy due to being the least restrictive service to meet presenting needs.   Plan: Return again in 4 weeks.  Diagnosis:  Encounter Diagnoses  Name Primary?   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  Patient/Guardian was advised Release of Information must be obtained prior to any record release in order to collaborate their care with an outside provider. Patient/Guardian was advised if they have not already done so to contact the registration department to sign all necessary forms in order for us  to release information regarding their care.   Consent: Patient/Guardian gives verbal consent for treatment and assignment of benefits for services provided during this visit. Patient/Guardian expressed understanding and agreed to proceed.   Phillip Gill, MSW, LCSW 04/22/2024,  9:17 AM

## 2024-04-23 ENCOUNTER — Other Ambulatory Visit: Payer: Self-pay

## 2024-04-23 ENCOUNTER — Encounter: Payer: Self-pay | Admitting: Podiatry

## 2024-04-23 ENCOUNTER — Other Ambulatory Visit (HOSPITAL_COMMUNITY): Payer: Self-pay

## 2024-04-23 ENCOUNTER — Encounter: Payer: Self-pay | Admitting: Family Medicine

## 2024-04-23 LAB — URINE CYTOLOGY ANCILLARY ONLY
Chlamydia: NEGATIVE
Comment: NEGATIVE
Comment: NORMAL
Neisseria Gonorrhea: NEGATIVE

## 2024-04-23 LAB — T-HELPER CELL (CD4) - (RCID CLINIC ONLY)
CD4 % Helper T Cell: 35 % (ref 33–65)
CD4 T Cell Abs: 1415 /uL (ref 400–1790)

## 2024-04-23 MED ORDER — CELECOXIB 200 MG PO CAPS
200.0000 mg | ORAL_CAPSULE | Freq: Two times a day (BID) | ORAL | 0 refills | Status: DC | PRN
Start: 2024-04-23 — End: 2024-05-04
  Filled 2024-04-23: qty 60, 30d supply, fill #0

## 2024-04-23 MED ORDER — CYCLOBENZAPRINE HCL 5 MG PO TABS
5.0000 mg | ORAL_TABLET | Freq: Three times a day (TID) | ORAL | 0 refills | Status: DC | PRN
  Filled 2024-04-23: qty 60, 10d supply, fill #0

## 2024-04-23 MED ORDER — FAMOTIDINE 40 MG PO TABS
40.0000 mg | ORAL_TABLET | Freq: Every day | ORAL | 0 refills | Status: DC
  Filled 2024-04-23 – 2024-06-02 (×4): qty 60, 60d supply, fill #0

## 2024-04-23 MED ORDER — OZEMPIC (2 MG/DOSE) 8 MG/3ML ~~LOC~~ SOPN
2.0000 mg | PEN_INJECTOR | SUBCUTANEOUS | 0 refills | Status: DC
  Filled 2024-04-23 – 2024-05-07 (×3): qty 9, 84d supply, fill #0

## 2024-04-24 ENCOUNTER — Other Ambulatory Visit: Payer: Self-pay

## 2024-04-24 LAB — LIPID PANEL
Cholesterol: 142 mg/dL (ref <200)
HDL: 39 mg/dL — ABNORMAL LOW (ref >=40)
LDL Cholesterol (Calc): 81 mg/dL
Non-HDL Cholesterol (Calc): 103 mg/dL (ref <130)
Total CHOL/HDL Ratio: 3.6 (calc) (ref <5.0)
Triglycerides: 120 mg/dL (ref <150)

## 2024-04-24 LAB — CBC WITH DIFFERENTIAL/PLATELET
Absolute Lymphocytes: 4526 {cells}/uL — ABNORMAL HIGH (ref 850–3900)
Absolute Monocytes: 806 {cells}/uL (ref 200–950)
Basophils Absolute: 50 {cells}/uL (ref 0–200)
Basophils Relative: 0.4 %
Eosinophils Absolute: 25 {cells}/uL (ref 15–500)
Eosinophils Relative: 0.2 %
HCT: 44.9 % (ref 38.5–50.0)
Hemoglobin: 15.1 g/dL (ref 13.2–17.1)
MCH: 29.8 pg (ref 27.0–33.0)
MCHC: 33.6 g/dL (ref 32.0–36.0)
MCV: 88.6 fL (ref 80.0–100.0)
MPV: 11.1 fL (ref 7.5–12.5)
Monocytes Relative: 6.5 %
Neutro Abs: 6994 {cells}/uL (ref 1500–7800)
Neutrophils Relative %: 56.4 %
Platelets: 279 Thousand/uL (ref 140–400)
RBC: 5.07 Million/uL (ref 4.20–5.80)
RDW: 14.3 % (ref 11.0–15.0)
Total Lymphocyte: 36.5 %
WBC: 12.4 Thousand/uL — ABNORMAL HIGH (ref 3.8–10.8)

## 2024-04-24 LAB — HIV-1 RNA QUANT-NO REFLEX-BLD
HIV 1 RNA Quant: 32 {copies}/mL — ABNORMAL HIGH
HIV-1 RNA Quant, Log: 1.51 {Log_copies}/mL — ABNORMAL HIGH

## 2024-04-24 LAB — COMPLETE METABOLIC PANEL WITHOUT GFR
AG Ratio: 1.4 (calc) (ref 1.0–2.5)
ALT: 24 U/L (ref 9–46)
AST: 25 U/L (ref 10–40)
Albumin: 4.6 g/dL (ref 3.6–5.1)
Alkaline phosphatase (APISO): 84 U/L (ref 36–130)
BUN/Creatinine Ratio: 8 (calc) (ref 6–22)
BUN: 11 mg/dL (ref 7–25)
CO2: 31 mmol/L (ref 20–32)
Calcium: 9.7 mg/dL (ref 8.6–10.3)
Chloride: 99 mmol/L (ref 98–110)
Creat: 1.45 mg/dL — ABNORMAL HIGH (ref 0.60–1.29)
Globulin: 3.3 g/dL (ref 1.9–3.7)
Glucose, Bld: 112 mg/dL — ABNORMAL HIGH (ref 65–99)
Potassium: 3.9 mmol/L (ref 3.5–5.3)
Sodium: 140 mmol/L (ref 135–146)
Total Bilirubin: 0.4 mg/dL (ref 0.2–1.2)
Total Protein: 7.9 g/dL (ref 6.1–8.1)

## 2024-04-24 LAB — HEPATITIS B DNA, ULTRAQUANTITATIVE, PCR
Hepatitis B DNA: NOT DETECTED [IU]/mL
Hepatitis B virus DNA: NOT DETECTED {Log_IU}/mL

## 2024-04-24 LAB — HEPATITIS B SURFACE ANTIBODY,QUALITATIVE: Hep B S Ab: NONREACTIVE

## 2024-04-24 LAB — HEPATITIS B CORE ANTIBODY, TOTAL: Hep B Core Total Ab: REACTIVE — AB

## 2024-04-24 LAB — RPR: RPR Ser Ql: NONREACTIVE

## 2024-04-24 LAB — HEPATITIS B SURFACE ANTIGEN: Hepatitis B Surface Ag: NONREACTIVE

## 2024-04-27 ENCOUNTER — Other Ambulatory Visit: Payer: Self-pay

## 2024-04-27 ENCOUNTER — Other Ambulatory Visit (HOSPITAL_COMMUNITY): Payer: Self-pay

## 2024-04-30 ENCOUNTER — Other Ambulatory Visit: Payer: Self-pay | Admitting: Pharmacist

## 2024-04-30 NOTE — Progress Notes (Signed)
 Specialty Pharmacy Ongoing Clinical Assessment Note  Phillip Gill is a 48 y.o. male who is being followed by the specialty pharmacy service for RxSp HIV   Patient's specialty medication(s) reviewed today: Bictegravir-Emtricitab-Tenofov (Biktarvy )   Missed doses in the last 4 weeks: 3-4   Patient/Caregiver did not have any additional questions or concerns.   Therapeutic benefit summary: Patient is achieving benefit   Adverse events/side effects summary: No adverse events/side effects   Patient's therapy is appropriate to: Continue    Goals Addressed             This Visit's Progress    Achieve Undetectable HIV Viral Load < 20   On track    Patient is not on track and improving. Patient will maintain adherence. Patient's viral load as of 11/08/23 was 53 copies/mL.       Maintain optimal adherence to therapy   On track    Patient is on track. Patient will maintain adherence.      Minimize and address adverse drug events/drug interactions   On track    Patient is on track. Patient will be evaluated at upcoming provider appointment to assess progress.         Follow up: 12 months  Alan JINNY Geralds Specialty Pharmacist

## 2024-04-30 NOTE — Telephone Encounter (Signed)
 PA was denied

## 2024-05-04 ENCOUNTER — Encounter: Payer: Self-pay | Admitting: Radiology

## 2024-05-04 ENCOUNTER — Other Ambulatory Visit: Payer: Self-pay | Admitting: Family

## 2024-05-04 ENCOUNTER — Other Ambulatory Visit: Payer: Self-pay | Admitting: Family Medicine

## 2024-05-04 ENCOUNTER — Other Ambulatory Visit (HOSPITAL_COMMUNITY): Payer: Self-pay | Admitting: Student

## 2024-05-04 ENCOUNTER — Other Ambulatory Visit: Payer: Self-pay | Admitting: Podiatry

## 2024-05-04 DIAGNOSIS — F332 Major depressive disorder, recurrent severe without psychotic features: Secondary | ICD-10-CM

## 2024-05-04 DIAGNOSIS — E559 Vitamin D deficiency, unspecified: Secondary | ICD-10-CM

## 2024-05-04 DIAGNOSIS — M5010 Cervical disc disorder with radiculopathy, unspecified cervical region: Secondary | ICD-10-CM

## 2024-05-05 ENCOUNTER — Encounter: Payer: Self-pay | Admitting: Family

## 2024-05-05 ENCOUNTER — Other Ambulatory Visit: Payer: Self-pay

## 2024-05-05 ENCOUNTER — Ambulatory Visit: Payer: MEDICAID | Attending: Internal Medicine | Admitting: Internal Medicine

## 2024-05-05 ENCOUNTER — Encounter: Payer: Self-pay | Admitting: Internal Medicine

## 2024-05-05 ENCOUNTER — Other Ambulatory Visit (HOSPITAL_COMMUNITY): Payer: Self-pay

## 2024-05-05 ENCOUNTER — Ambulatory Visit: Payer: MEDICAID | Admitting: Family

## 2024-05-05 VITALS — BP 100/62 | HR 95 | Ht 72.0 in | Wt 254.0 lb

## 2024-05-05 VITALS — BP 129/85 | HR 79 | Wt 251.0 lb

## 2024-05-05 DIAGNOSIS — R079 Chest pain, unspecified: Secondary | ICD-10-CM | POA: Diagnosis not present

## 2024-05-05 DIAGNOSIS — B181 Chronic viral hepatitis B without delta-agent: Secondary | ICD-10-CM | POA: Diagnosis not present

## 2024-05-05 DIAGNOSIS — F419 Anxiety disorder, unspecified: Secondary | ICD-10-CM

## 2024-05-05 DIAGNOSIS — B20 Human immunodeficiency virus [HIV] disease: Secondary | ICD-10-CM

## 2024-05-05 DIAGNOSIS — Z79899 Other long term (current) drug therapy: Secondary | ICD-10-CM | POA: Diagnosis not present

## 2024-05-05 DIAGNOSIS — Z5941 Food insecurity: Secondary | ICD-10-CM | POA: Insufficient documentation

## 2024-05-05 DIAGNOSIS — F32A Depression, unspecified: Secondary | ICD-10-CM

## 2024-05-05 MED ORDER — BIKTARVY 50-200-25 MG PO TABS
ORAL_TABLET | ORAL | 5 refills | Status: AC
  Filled 2024-05-05 – 2024-05-07 (×2): qty 30, fill #0
  Filled 2024-05-19: qty 30, 30d supply, fill #0
  Filled 2024-06-10 – 2024-06-11 (×2): qty 30, 30d supply, fill #1
  Filled 2024-07-09: qty 30, 30d supply, fill #2
  Filled 2024-08-06: qty 30, 30d supply, fill #3

## 2024-05-05 NOTE — Assessment & Plan Note (Signed)
 Depression remains labile and and appears to have good adherence and tolerance to aripiprazole , bupropion , and escitalopram  as prescribed by psychiatry.  In counseling every 3 weeks.  Continues to have dysphoric mood with no evidence of psychosis.  Continue current dose of aripiprazole , escitalopram , and bupropion  with adjustments per psychiatry as indicated.

## 2024-05-05 NOTE — Progress Notes (Signed)
 Brief Narrative   Patient ID: Phillip Gill, male    DOB: [birth year], 48 y.o.   MRN: [id number]  Phillip Gill is a 47 y/o AA male diagnosed with HIV disease in July 2012 with risk factor of MSM. Initial viral load of 99,000 and CD4 of 510 entering care at Stage 1. [id number] negative. Initial genotype with no significant resistance. Previous ART experience with Prezista , Truvada , and Biktarvy .    Subjective:   Chief Complaint  Patient presents with   Follow-up    HPI:  Phillip Gill is a 48 y.o. male with HIV disease last seen on 11/21/2023 with well-controlled virus and good adherence and tolerance to Biktarvy .  Viral load was undetectable with CD4 count of 1116.  Most recent lab work completed on 04/22/2024 with viral load that remains undetectable and CD4 count 1415.  STD testing negative for gonorrhea and chlamydia.  Kidney function, liver function, electrolytes within normal ranges.  Lipid profile triglycerides 120, LDL 81, and HDL 39.  Here today for routine follow-up.  Phillip Gill has been doing okay since his last office visit and continues to take Biktarvy  as prescribed with no adverse side effects or problems obtaining medication from the pharmacy.  Has missed a few doses intermittently secondary to exacerbations in his depression for which he is being seen by psychiatry and counseling.  Not currently working with stable housing.  He is on food assistance which has been temporarily discontinued/reduced secondary to changes in the federal government.  Transportation is stable via personal vehicle with housing currently living with parents.  Continues to work with his depression.  Is seeking disability and working with an pensions consultant.  Healthcare maintenance reviewed.  Not currently sexually active.  Denies fevers, chills, night sweats, headaches, changes in vision, neck pain/stiffness, nausea, diarrhea, vomiting, lesions or rashes.  Lab Results  Component Value Date   CD4TCELL  35 04/22/2024   CD4TABS 1,415 04/22/2024   Lab Results  Component Value Date   HIV1RNAQUANT 32 (H) 04/22/2024     Allergies  Allergen Reactions   Bactrim [Sulfamethoxazole W/Trimethoprim (Co-Trimoxazole)] Hives and Shortness Of Breath   Sulfa Antibiotics Hives and Shortness Of Breath   Cymbalta  [Duloxetine  Hcl] Diarrhea   Duloxetine  Diarrhea   Metformin  And Related Nausea And Vomiting   Other Hives and Swelling    [employer] toothpaste    Porcine (Pork) Protein-Containing Drug Products Other (See Comments)    Pt refused to eat pork products.   Zoloft [Sertraline Hcl] Diarrhea   Clindamycin /Lincomycin Rash   Neurontin  [Gabapentin ] Rash      Outpatient Medications Prior to Visit  Medication Sig Dispense Refill   ARIPiprazole  (ABILIFY ) 5 MG tablet Take 1 tablet (5 mg total) by mouth daily. 90 tablet 0   Blood Glucose Monitoring Suppl (BLOOD GLUCOSE MONITOR SYSTEM) w/Device KIT Use to check blood sugar in the morning, at noon, and at bedtime. 1 kit 0   buPROPion  ER (WELLBUTRIN  SR) 100 MG 12 hr tablet Take 1 tablet (100 mg total) by mouth daily. 90 tablet 0   celecoxib  (CELEBREX ) 200 MG capsule Take 1 capsule (200 mg total) by mouth 2 (two) times daily as needed (pain). Take with food 60 capsule 0   Continuous Glucose Sensor (FREESTYLE LIBRE 3 SENSOR) MISC Place 1 sensor on the skin every 14 days. Use to check glucose continuously 2 each 4   cyclobenzaprine  (FLEXERIL ) 5 MG tablet Take 1-2 tablets (5-10 mg total) by mouth 3 (three) times daily as needed  for muscle spasms. 60 tablet 0   diclofenac  Sodium (VOLTAREN ) 1 % GEL Apply 4 grams topically 4 (four) times daily to affected joint. 100 g 11   Elastic Bandages & Supports (CARPAL TUNNEL WRIST STABILIZER) MISC 1 each by Does not apply route as needed. 1 each 0   Elastic Bandages & Supports (MEDICAL COMPRESSION STOCKINGS) MISC 1 each by Does not apply route daily. 1 each 0   escitalopram  (LEXAPRO ) 20 MG tablet Take 1 tablet (20 mg total)  by mouth daily. 90 tablet 0   esomeprazole  (NEXIUM ) 40 MG capsule Take 1 capsule (40 mg total) by mouth 2 (two) times daily before a meal. 180 capsule 3   famotidine  (PEPCID ) 40 MG tablet Take 1 tablet (40 mg total) by mouth at bedtime. 60 tablet 0   finasteride  (PROSCAR ) 5 MG tablet Take 1 tablet (5 mg total) by mouth daily. 30 tablet 1   meloxicam  (MOBIC ) 15 MG tablet Take 1 tablet (15 mg total) by mouth daily. 60 tablet 1   methylPREDNISolone  (MEDROL  DOSEPAK) 4 MG TBPK tablet Take as directed on pack. 21 tablet 0   Olmesartan -amLODIPine -HCTZ 40-10-25 MG TABS Take 1 tablet by mouth daily. 90 tablet 3   pregabalin  (LYRICA ) 75 MG capsule Take 1 capsule (75 mg total) by mouth 2 (two) times daily as needed (nerve pain). 60 capsule 3   rosuvastatin  (CRESTOR ) 10 MG tablet Take 1 tablet (10 mg total) by mouth daily. 30 tablet 5   Semaglutide , 2 MG/DOSE, (OZEMPIC , 2 MG/DOSE,) 8 MG/3ML SOPN Inject 2 mg as directed once a week. 9 mL 0   sildenafil  (VIAGRA ) 100 MG tablet Take 0.5-1 tablets (50-100 mg total) by mouth daily as needed for erectile dysfunction. Do not exceed 100 mg in a day 30 tablet 1   Vitamin D , Ergocalciferol , (DRISDOL ) 1.25 MG (50000 UNIT) CAPS capsule Take 1 capsule (50,000 Units total) by mouth every 7 (seven) days. 8 capsule 0   bictegravir-emtricitabine -tenofovir  AF (BIKTARVY ) 50-200-25 MG TABS tablet TAKE 1 TABLET BY MOUTH EVERY DAY 30 tablet 5   No facility-administered medications prior to visit.     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 Efrain- sees him every 4 months   Hypertension    IBS (irritable bowel syndrome)    Migraine     none in years (02/16/2015   Overweight 07/20/2015   Renal insufficiency 12/29/2013     Past Surgical History:  Procedure Laterality Date   CO2 LASER APPLICATION N/A 02/11/2013   Procedure: CO2 LASER APPLICATION;  Surgeon: Bernarda Ned, MD;  Location: [facility] [place];  Service: General;  Laterality: N/A;   FOOT SURGERY     HIGH RESOLUTION ANOSCOPY N/A 02/11/2013   Procedure: HIGH RESOLUTION ANOSCOPY WITH BIOPSY, LASER ABLATION;  Surgeon: Bernarda Ned, MD;  Location: [facility];  Service: General;  Laterality: N/A;   WISDOM TOOTH EXTRACTION          Review of Systems  Constitutional:  Negative for appetite change, chills, fatigue, fever and unexpected weight change.  Eyes:  Negative  for visual disturbance.  Respiratory:  Negative for cough, chest tightness, shortness of breath and wheezing.   Cardiovascular:  Negative for chest pain and leg swelling.  Gastrointestinal:  Negative for abdominal pain, constipation, diarrhea, nausea and vomiting.  Genitourinary:  Negative for dysuria, flank pain, frequency, genital sores, hematuria and urgency.  Skin:  Negative for rash.  Allergic/Immunologic: Negative for immunocompromised state.  Neurological:  Negative for dizziness and headaches.  Psychiatric/Behavioral:  Positive for dysphoric mood.      Objective:   BP 129/85   Pulse 79   Wt 251 lb (113.9 kg)   BMI 34.04 kg/m  Nursing note and vital signs reviewed.  Physical Exam Constitutional:      General: He is not in acute distress.    Appearance: He is well-developed.  Eyes:     Conjunctiva/sclera: Conjunctivae normal.  Cardiovascular:     Rate and Rhythm: Normal rate and regular rhythm.     Heart sounds: Normal heart sounds. No murmur heard.    No friction rub. No gallop.  Pulmonary:     Effort: Pulmonary effort is normal. No respiratory distress.     Breath sounds: Normal breath sounds. No wheezing or rales.  Chest:     Chest wall: No  tenderness.  Abdominal:     General: Bowel sounds are normal.     Palpations: Abdomen is soft.     Tenderness: There is no abdominal tenderness.  Musculoskeletal:     Cervical back: Neck supple.  Lymphadenopathy:     Cervical: No cervical adenopathy.  Skin:    General: Skin is warm and dry.     Findings: No rash.  Neurological:     Mental Status: He is alert and oriented to person, place, and time.          05/05/2024    9:40 AM 04/02/2024   11:34 AM 03/25/2024    9:34 AM 02/18/2024    9:25 AM 01/16/2024   10:53 AM  Depression screen PHQ 2/9  Decreased Interest 3 3  3    Down, Depressed, Hopeless 3 3  3    PHQ - 2 Score 6 6  6    Altered sleeping 3 3     Tired, decreased energy 3 3     Change in appetite 3 3     Feeling bad or failure about yourself  3 3     Trouble concentrating 3 3     Moving slowly or fidgety/restless 3 3     Suicidal thoughts 3 3     PHQ-9 Score 27 27     Difficult doing work/chores Extremely dIfficult Extremely dIfficult        Information is confidential and restricted. Go to Review Flowsheets to unlock data.        05/05/2024    9:40 AM 04/02/2024   11:34 AM 03/25/2024    9:32 AM 01/16/2024   10:19 AM  GAD 7 : Generalized Anxiety Score  Nervous, Anxious, on Edge 3 3    Control/stop worrying 3 3    Worry too much - different things 3 3    Trouble relaxing 3 2    Restless 3 2    Easily annoyed or irritable 3 3    Afraid - awful might happen 3 3    Total GAD 7 Score 21 19    Anxiety Difficulty Extremely difficult Extremely difficult       Information is confidential and restricted. Go to Review Flowsheets to unlock data.  The 10-year ASCVD risk score (Arnett DK, et al., 2019) is: 14.2%   Values used to calculate the score:     Age: 38 years     Clincally relevant sex: Male     Is Non-Hispanic African American: Yes     Diabetic: Yes     Tobacco smoker: Yes     Systolic Blood Pressure: 129 mmHg     Is BP treated: No     HDL Cholesterol:  39 mg/dL     Total Cholesterol: 142 mg/dL      Assessment & Plan:    Patient Active Problem List   Diagnosis Date Noted   Food insecurity 05/05/2024   Elevated sed rate 04/06/2024   Plantar fascial fibromatosis of both feet 04/02/2024   Uncontrolled type 2 diabetes mellitus with hyperglycemia (HCC) 10/12/2023   Leukocytosis 10/12/2023   Hyperlipidemia 10/12/2023   Proteinuria due to type 2 diabetes mellitus (HCC) 10/10/2023   Carpal tunnel syndrome on both sides 10/08/2023   Hyponatremia 10/08/2023   AKI (acute kidney injury) 10/08/2023   Type 2 diabetes mellitus with hyperglycemia (HCC) 10/08/2023   Nocturia 04/30/2023   Neck mass 04/01/2023   MDD (major depressive disorder), recurrent severe, without psychosis (HCC) 03/05/2023   Gastroesophageal reflux disease 03/05/2023   Passive suicidal ideations 02/05/2023   OSA on CPAP 02/05/2023   MDD (major depressive disorder) 12/19/2022   Urinary frequency 12/18/2022   Left foot pain 12/18/2022   Poor social situation 12/18/2022   Snoring 01/22/2022   Encounter for physical examination related to employment 09/26/2021   Human monkeypox 04/06/2021   Cervical pain 02/07/2021   Erectile dysfunction 08/29/2020   Healthcare maintenance 04/05/2020   Foot lesion 12/24/2019   Human immunodeficiency virus (HIV) disease (HCC) 08/25/2019   Gonorrhea 02/11/2019   Hepatitis B surface antigen positive 09/05/2018   Overweight 07/20/2015   Gastroenteritis 03/08/2015   HIV (human immunodeficiency virus infection) (HCC)    Tobacco abuse    LGV (lymphogranuloma venereum)    Primary syphilis    Chronic hepatitis B (HCC) 10/03/2014   HTN (hypertension)    Gynecomastia 04/30/2014   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   Chronic pain associated with significant psychosocial dysfunction 09/18/2012   Fibromyalgia muscle pain 02/11/2012   Anxiety and depression 12/07/2011   Other constipation 07/16/2011   Anxiety 02/02/2011     Problem List Items  Addressed This Visit       Digestive   Chronic hepatitis B (HCC) (Chronic)   Most recent hepatitis B testing with negative hepatitis B surface antigen and borderline hepatitis B surface antibody.  Appears to have resolved hepatitis B infection at this point.  No episodes of flare.  Continue current dose of Biktarvy .      Relevant Medications   bictegravir-emtricitabine -tenofovir  AF (BIKTARVY ) 50-200-25 MG TABS tablet     Other   Human immunodeficiency virus (HIV) disease (HCC) - Primary (Chronic)   Phillip Gill continues to have well-controlled virus with good adherence and tolerance to Biktarvy .  Reviewed lab work and discussed plan of care and U equals U.  Discussed importance of taking medication daily as prescribed to reduce risk of disease progression and complications in the future.  Depression remains a barrier to his current ability to take his medications effectively and is working with psychiatry.  Social determinants of health reviewed with interventions indicated and I provided food assistance.  Continue current dose of Biktarvy .  Plan for follow-up in 6 months or sooner if needed with lab work 1  to 2 weeks prior to appointment.      Relevant Medications   bictegravir-emtricitabine -tenofovir  AF (BIKTARVY ) 50-200-25 MG TABS tablet   Anxiety and depression   Depression remains labile and and appears to have good adherence and tolerance to aripiprazole , bupropion , and escitalopram  as prescribed by psychiatry.  In counseling every 3 weeks.  Continues to have dysphoric mood with no evidence of psychosis.  Continue current dose of aripiprazole , escitalopram , and bupropion  with adjustments per psychiatry as indicated.      Food insecurity   Food insecurity with changes to [employer] Bag of food provided. Working with case management. Will continue to support as able.         I am having Jamison M. Weatherholtz Shan maintain his diclofenac  Sodium, esomeprazole , Carpal Tunnel Wrist  Stabilizer, Medical Compression Stockings, sildenafil , Blood Glucose Monitor System, Vitamin D  (Ergocalciferol ), rosuvastatin , Olmesartan -amLODIPine -HCTZ, meloxicam , FreeStyle Libre 3 Sensor, pregabalin , finasteride , ARIPiprazole , buPROPion  ER, escitalopram , methylPREDNISolone , Ozempic  (2 MG/DOSE), famotidine , celecoxib , cyclobenzaprine , and Biktarvy .   Meds ordered this encounter  Medications   bictegravir-emtricitabine -tenofovir  AF (BIKTARVY ) 50-200-25 MG TABS tablet    Sig: TAKE 1 TABLET BY MOUTH EVERY DAY    Dispense:  30 tablet    Refill:  5    Please mail    Supervising Provider:   LUIZ CHANNEL [id number]    Prescription Type::   Renewal     Follow-up: Return in about 6 months (around 11/02/2024). or sooner if needed.    Cathlyn July, MSN, FNP-C Nurse Practitioner [facility] Main number: [phone]

## 2024-05-05 NOTE — Assessment & Plan Note (Signed)
 [name] continues to have well-controlled virus with good adherence and tolerance to Biktarvy .  Reviewed lab work and discussed plan of care and U equals U.  Discussed importance of taking medication daily as prescribed to reduce risk of disease progression and complications in the future.  Depression remains a barrier to his current ability to take his medications effectively and is working with psychiatry.  Social determinants of health reviewed with interventions indicated and I provided food assistance.  Continue current dose of Biktarvy .  Plan for follow-up in 6 months or sooner if needed with lab work 1 to 2 weeks prior to appointment.

## 2024-05-05 NOTE — Assessment & Plan Note (Signed)
 Food insecurity with changes to [employer] program. Bag of food provided. Working with case management. Will continue to support as able.

## 2024-05-05 NOTE — Patient Instructions (Addendum)
 Nice to see you..  Continue to take your medication daily as prescribed.  Refills have been sent to the pharmacy.  Plan for follow up in 6 months or sooner if needed with lab work 1-2 weeks prior to appointment.   Have a great day and stay safe!   Mental Health Resources  988: can call or text 24/7  [facility]: Address: [address] [place], [place] [address] Open 24 hours Phone: [phone]  [facility]: Address: [address] [place], [place] [address] Phone: [phone] Appointments: [email or web address]

## 2024-05-05 NOTE — Progress Notes (Signed)
 Cardiology Office Note  Date: 05/05/2024   ID: Phillip Gill, DOB July 12, 1975, MRN [id number]  PCP:  Phillip Beverley NOVAK, MD  Cardiologist:  None Electrophysiologist:  None   History of Present Illness: Phillip Gill is a 48 y.o. male known to have HIV, DM2 on Ozempic , CKD stage III, HTN was referred to cardiology clinic for evaluation of chest pain.  Ongoing chest pain for the last 1 month.  Sharp chest pains.  Located substernally and left side of his chest.  Occurs at rest and also with exertion.  Lasts for about 1 to 2 hours.  Random episodes.  Occurs few times a week.  Does not have other symptoms of DOE, dizziness, syncope, palpitations, leg swelling.  Quit smoking around 2 years ago.  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 Efrain- sees him every 4 months   Hypertension    IBS (irritable bowel syndrome)    Migraine    none in years (02/16/2015   Overweight 07/20/2015   Renal insufficiency 12/29/2013    Past Surgical History:  Procedure Laterality Date   CO2 LASER APPLICATION N/A 02/11/2013   Procedure: CO2 LASER APPLICATION;  Surgeon: Bernarda Ned, MD;  Location: [facility];  Service: General;  Laterality: N/A;   FOOT SURGERY     HIGH RESOLUTION ANOSCOPY N/A 02/11/2013   Procedure: HIGH RESOLUTION ANOSCOPY WITH BIOPSY, LASER ABLATION;  Surgeon: Bernarda Ned, MD;  Location: [facility];  Service: General;  Laterality: N/A;   WISDOM TOOTH EXTRACTION      Current Outpatient Medications  Medication Sig Dispense Refill   ARIPiprazole  (ABILIFY ) 5 MG tablet Take 1 tablet (5 mg total) by  mouth daily. 90 tablet 0   bictegravir-emtricitabine -tenofovir  AF (BIKTARVY ) 50-200-25 MG TABS tablet TAKE 1 TABLET BY MOUTH EVERY DAY 30 tablet 5   Blood Glucose Monitoring Suppl (BLOOD GLUCOSE MONITOR SYSTEM) w/Device KIT Use to check blood sugar in the morning, at noon, and at bedtime. 1 kit 0   buPROPion  ER (WELLBUTRIN  SR) 100 MG 12 hr tablet Take 1 tablet (100 mg total) by mouth daily. 90 tablet 0   celecoxib  (CELEBREX ) 200 MG capsule Take 1 capsule (200 mg total) by mouth 2 (two) times daily as needed (pain). Take with food 60 capsule 0   Continuous Glucose Sensor (FREESTYLE LIBRE 3 SENSOR) MISC Place 1 sensor on the skin every 14 days. Use to check glucose continuously 2 each 4   cyclobenzaprine  (FLEXERIL ) 5 MG tablet Take 1-2 tablets (5-10 mg total) by mouth 3 (three) times daily as needed for muscle spasms. 60 tablet 0   Elastic Bandages & Supports (CARPAL TUNNEL WRIST STABILIZER) MISC 1 each by Does not apply route as needed. 1 each 0   Elastic Bandages & Supports (MEDICAL COMPRESSION STOCKINGS) MISC 1 each by Does not apply route daily. 1 each 0   escitalopram  (LEXAPRO ) 20 MG tablet Take 1 tablet (20 mg total) by mouth daily. 90 tablet 0   esomeprazole  (NEXIUM ) 40 MG capsule Take 1 capsule (40 mg total)  by mouth 2 (two) times daily before a meal. 180 capsule 3   famotidine  (PEPCID ) 40 MG tablet Take 1 tablet (40 mg total) by mouth at bedtime. 60 tablet 0   finasteride  (PROSCAR ) 5 MG tablet Take 1 tablet (5 mg total) by mouth daily. 30 tablet 1   meloxicam  (MOBIC ) 15 MG tablet Take 1 tablet (15 mg total) by mouth daily. 60 tablet 1   methylPREDNISolone  (MEDROL  DOSEPAK) 4 MG TBPK tablet Take as directed on pack. 21 tablet 0   Olmesartan -amLODIPine -HCTZ 40-10-25 MG TABS Take 1 tablet by mouth daily. 90 tablet 3   pregabalin  (LYRICA ) 75 MG capsule Take 1 capsule (75 mg total) by mouth 2 (two) times daily as needed (nerve pain). 60 capsule 3   rosuvastatin  (CRESTOR ) 10 MG tablet Take 1 tablet  (10 mg total) by mouth daily. 30 tablet 5   Semaglutide , 2 MG/DOSE, (OZEMPIC , 2 MG/DOSE,) 8 MG/3ML SOPN Inject 2 mg as directed once a week. 9 mL 0   sildenafil  (VIAGRA ) 100 MG tablet Take 0.5-1 tablets (50-100 mg total) by mouth daily as needed for erectile dysfunction. Do not exceed 100 mg in a day 30 tablet 1   Vitamin D , Ergocalciferol , (DRISDOL ) 1.25 MG (50000 UNIT) CAPS capsule Take 1 capsule (50,000 Units total) by mouth every 7 (seven) days. 8 capsule 0   diclofenac  Sodium (VOLTAREN ) 1 % GEL Apply 4 grams topically 4 (four) times daily to affected joint. (Patient not taking: Reported on 05/05/2024) 100 g 11   No current facility-administered medications for this visit.   Allergies:  Bactrim [sulfamethoxazole w/trimethoprim (co-trimoxazole)], Sulfa antibiotics, Cymbalta  [duloxetine  hcl], Duloxetine , Metformin  and related, Other, Porcine (pork) protein-containing drug products, Zoloft [sertraline hcl], Clindamycin /lincomycin, and Neurontin  [gabapentin ]   Social History: The patient  reports that he quit smoking about 2 years ago. His smoking use included cigarettes. He started smoking about 20 years ago. He has a 18 pack-year smoking history. He has never been exposed to tobacco smoke. He has quit using smokeless tobacco. He reports current alcohol use. He reports that he does not use drugs.   Family History: The patient's family history includes Colon cancer in his father; Diabetes in his mother; Hypertension in his brother and mother; Pancreatic cancer in his mother; Sleep apnea in his cousin; Stroke in his mother.   ROS:  Please see the history of present illness. Otherwise, complete review of systems is positive for none  All other systems are reviewed and negative.   Physical Exam: VS:  Ht 6' (1.829 m)   Wt 254 lb (115.2 kg)   BMI 34.45 kg/m , BMI Body mass index is 34.45 kg/m.  Wt Readings from Last 3 Encounters:  05/05/24 254 lb (115.2 kg)  05/05/24 251 lb (113.9 kg)  04/08/24  247 lb 12.8 oz (112.4 kg)    General: Patient appears comfortable at rest. HEENT: Conjunctiva and lids normal, oropharynx clear with moist mucosa. Neck: Supple, no elevated JVP or carotid bruits, no thyromegaly. Lungs: Clear to auscultation, nonlabored breathing at rest. Cardiac: Regular rate and rhythm, no S3 or significant systolic murmur, no pericardial rub. Abdomen: Soft, nontender, no hepatomegaly, bowel sounds present, no guarding or rebound. Extremities: No pitting edema, distal pulses 2+. Skin: Warm and dry. Musculoskeletal: No kyphosis. Neuropsychiatric: Alert and oriented x3, affect grossly appropriate.  Recent Labwork: 09/19/2023: Pro B Natriuretic peptide (BNP) 18.0 04/02/2024: TSH 1.83 04/22/2024: ALT 24; AST 25; BUN 11; Creat 1.45; Hemoglobin 15.1; Platelets 279; Potassium 3.9; Sodium 140  Component Value Date/Time   CHOL 142 04/22/2024 1046   TRIG 120 04/22/2024 1046   HDL 39 (L) 04/22/2024 1046   CHOLHDL 3.6 04/22/2024 1046   VLDL 50.2 (H) 03/03/2024 1035   LDLCALC 81 04/22/2024 1046     Assessment and Plan:  Chest pain: Ongoing for the last 1 month.  Sharp chest pain is located substernally and left side of his chest.  Occurs at rest and exertion.  Last for about 1 to 2 hours.  Frequency few times per week.  Cardiac status include HIV and DM 2. Obtain NM PET/CT stress test (due to CKD stage IIIb) and echocardiogram.  HTN, controlled: Continue current antihypertensives, olmesartan -amlodipine -HCTZ 40-10-25 mg once daily.  Follows with PCP.  DM2: On Ozempic .  HbA1c controlled now.  Previously it was around 10.  HIV: On Biktarvy .   45 minutes spent in reviewing prior medical records, more than 3 labs, discussion and documentation.  Medication Adjustments/Labs and Tests Ordered: Current medicines are reviewed at length with the patient today.  Concerns regarding medicines are outlined above.    Disposition:  Follow up 6 months  Signed Phillip Laroque Priya  Radames Mejorado, MD, 05/05/2024 2:48 PM    [facility] [address] [place], [place], [place] [address]

## 2024-05-05 NOTE — Assessment & Plan Note (Signed)
 Most recent hepatitis B testing with negative hepatitis B surface antigen and borderline hepatitis B surface antibody.  Appears to have resolved hepatitis B infection at this point.  No episodes of flare.  Continue current dose of Biktarvy .

## 2024-05-05 NOTE — Patient Instructions (Addendum)
 Medication Instructions:  Your physician recommends that you continue on your current medications as directed. Please refer to the Current Medication list given to you today.   Labwork: None  Testing/Procedures: Your physician has requested that you have an echocardiogram. Echocardiography is a painless test that uses sound waves to create images of your heart. It provides your doctor with information about the size and shape of your heart and how well your heart's chambers and valves are working. This procedure takes approximately one hour. There are no restrictions for this procedure. Please do NOT wear cologne, perfume, aftershave, or lotions (deodorant is allowed). Please arrive 15 minutes prior to your appointment time.  Please note: We ask at that you not bring children with you during ultrasound (echo/ vascular) testing. Due to room size and safety concerns, children are not allowed in the ultrasound rooms during exams. Our front office staff cannot provide observation of children in our lobby area while testing is being conducted. An adult accompanying a patient to their appointment will only be allowed in the ultrasound room at the discretion of the ultrasound technician under special circumstances. We apologize for any inconvenience.     Please report to Radiology at the [facility] Main Entrance 30 minutes early for your test.  [address] [place], [place] [address]                         OR   Please report to Radiology at [facility] Main Entrance, medical mall, 30 mins prior to your test.  [address]  [place], [place]  How to Prepare for Your Cardiac PET/CT Stress Test:  Nothing to eat or drink, except water, 3 hours prior to arrival time.  NO caffeine/decaffeinated products, or chocolate 12 hours prior to arrival. (Please note decaffeinated beverages (teas/coffees) still contain caffeine).  If you have caffeine within 12 hours  prior, the test will need to be rescheduled.  Medication instructions: Do not take erectile dysfunction medications for 72 hours prior to test (sildenafil , tadalafil ) Do not take nitrates (isosorbide mononitrate, Ranexa) the day before or day of test Do not take tamsulosin the day before or morning of test Hold theophylline containing medications for 12 hours. Hold Dipyridamole 48 hours prior to the test.  Diabetic Preparation: If able to eat breakfast prior to 3 hour fasting, you may take all medications, including your insulin . Do not worry if you miss your breakfast dose of insulin  - start at your next meal. If you do not eat prior to 3 hour fast-Hold all diabetes (oral and insulin ) medications. Patients who wear a continuous glucose monitor MUST remove the device prior to scanning.  You may take your remaining medications with water.  NO perfume, cologne or lotion on chest or abdomen area. FEMALES - Please avoid wearing dresses to this appointment.  Total time is 1 to 2 hours; you may want to bring reading material for the waiting time.  IF YOU THINK YOU MAY BE PREGNANT, OR ARE NURSING PLEASE INFORM THE TECHNOLOGIST.  In preparation for your appointment, medication and supplies will be purchased.  Appointment availability is limited, so if you need to cancel or reschedule, please call the Radiology Department Scheduler at [phone] 24 hours in advance to avoid a cancellation fee of $100.00  What to Expect When you Arrive:  Once you arrive and check in for your appointment, you will be taken to a preparation room within  the Radiology Department.  A technologist or Nurse will obtain your medical history, verify that you are correctly prepped for the exam, and explain the procedure.  Afterwards, an IV will be started in your arm and electrodes will be placed on your skin for EKG monitoring during the stress portion of the exam. Then you will be escorted to the PET/CT scanner.  There,  staff will get you positioned on the scanner and obtain a blood pressure and EKG.  During the exam, you will continue to be connected to the EKG and blood pressure machines.  A small, safe amount of a radioactive tracer will be injected in your IV to obtain a series of pictures of your heart along with an injection of a stress agent.    After your Exam:  It is recommended that you eat a meal and drink a caffeinated beverage to counter act any effects of the stress agent.  Drink plenty of fluids for the remainder of the day and urinate frequently for the first couple of hours after the exam.  Your doctor will inform you of your test results within 7-10 business days.  For more information and frequently asked questions, please visit our website: [email or web address]  For questions about your test or how to prepare for your test, please call: Cardiac Imaging Nurse Navigators Office: [phone]   Follow-Up: Your physician recommends that you schedule a follow-up appointment in: 6 months  Any Other Special Instructions Will Be Listed Below (If Applicable). Thank you for choosing [facility]!     If you need a refill on your cardiac medications before your next appointment, please call your pharmacy.

## 2024-05-06 ENCOUNTER — Other Ambulatory Visit: Payer: Self-pay

## 2024-05-06 ENCOUNTER — Other Ambulatory Visit (HOSPITAL_COMMUNITY): Payer: Self-pay

## 2024-05-06 MED ORDER — CELECOXIB 200 MG PO CAPS
200.0000 mg | ORAL_CAPSULE | Freq: Two times a day (BID) | ORAL | 0 refills | Status: DC | PRN
  Filled 2024-05-06 – 2024-05-07 (×2): qty 60, 30d supply, fill #0

## 2024-05-06 MED ORDER — CYCLOBENZAPRINE HCL 5 MG PO TABS
5.0000 mg | ORAL_TABLET | Freq: Three times a day (TID) | ORAL | 0 refills | Status: DC | PRN
  Filled 2024-05-06: qty 60, 10d supply, fill #0

## 2024-05-07 ENCOUNTER — Other Ambulatory Visit: Payer: Self-pay | Admitting: Family Medicine

## 2024-05-07 ENCOUNTER — Other Ambulatory Visit: Payer: Self-pay | Admitting: Podiatry

## 2024-05-07 ENCOUNTER — Other Ambulatory Visit (HOSPITAL_COMMUNITY): Payer: Self-pay | Admitting: Student

## 2024-05-07 DIAGNOSIS — M5010 Cervical disc disorder with radiculopathy, unspecified cervical region: Secondary | ICD-10-CM

## 2024-05-07 DIAGNOSIS — F332 Major depressive disorder, recurrent severe without psychotic features: Secondary | ICD-10-CM

## 2024-05-08 ENCOUNTER — Other Ambulatory Visit: Payer: Self-pay

## 2024-05-08 ENCOUNTER — Other Ambulatory Visit (HOSPITAL_BASED_OUTPATIENT_CLINIC_OR_DEPARTMENT_OTHER): Payer: Self-pay

## 2024-05-08 ENCOUNTER — Other Ambulatory Visit (HOSPITAL_COMMUNITY): Payer: Self-pay

## 2024-05-11 ENCOUNTER — Other Ambulatory Visit: Payer: Self-pay

## 2024-05-19 ENCOUNTER — Other Ambulatory Visit: Payer: Self-pay

## 2024-05-19 ENCOUNTER — Other Ambulatory Visit (HOSPITAL_COMMUNITY): Payer: Self-pay

## 2024-05-19 NOTE — Progress Notes (Signed)
 Clinical Intervention Note  Clinical Intervention Notes: Patient reported that he missed a few doses this last month due to forgetting and depression setting in. Reminded patient of importance of taking medication and keeping viral load undectable. Patient stated he would try to do better this month with taking medication daily.   Clinical Intervention Outcomes: Improved therapy adherence; Improved therapy effectiveness   [facility] Specialty Pharmacist

## 2024-05-19 NOTE — Progress Notes (Signed)
 Specialty Pharmacy Refill Coordination Note  Phillip Gill is a 48 y.o. male contacted today regarding refills of specialty medication(s) Bictegravir-Emtricitab-Tenofov (Biktarvy )   Patient requested Delivery   Delivery date: 05/20/24   Verified address: [address]  [place]   Medication will be filled on: 05/19/24

## 2024-05-20 ENCOUNTER — Ambulatory Visit: Payer: MEDICAID

## 2024-05-20 ENCOUNTER — Ambulatory Visit (INDEPENDENT_AMBULATORY_CARE_PROVIDER_SITE_OTHER): Payer: MEDICAID | Admitting: Licensed Clinical Social Worker

## 2024-05-20 DIAGNOSIS — F332 Major depressive disorder, recurrent severe without psychotic features: Secondary | ICD-10-CM | POA: Diagnosis not present

## 2024-05-20 DIAGNOSIS — F411 Generalized anxiety disorder: Secondary | ICD-10-CM | POA: Diagnosis not present

## 2024-05-20 NOTE — Progress Notes (Signed)
 THERAPIST PROGRESS NOTE   Session Date: 05/20/2024  Session Time: 1508 - 1612  Participation Level: Active  MSE/Presentation: Behavior: Appropriate and Sharing Speech: Normal Thought Process: Coherent Cognition: Alert and Appropriate Mood: Negative and Dysphoric Affect: Depressed and Flat Insight: Lacking Appearance: Casual  Type of Therapy: Individual Therapy  Treatment Goals addressed:   Not Progressing (6) LTG: Reduce frequency, intensity, and duration of depression symptoms so that daily functioning is improved (OP Depression) LTG: Increase coping skills to manage depression and improve ability to perform daily activities (OP Depression) LTG: To not be depressed, have more energy, and stop over eating (OP Depression) STG: Report a decrease in anxiety symptoms as evidenced by an overall reduction in anxiety score by a minimum of 25% on the Generalized Anxiety Disorder Scale (GAD-7) (Anxiety) STG: Lennie Vasco will reduce frequency of avoidant behaviors by 50% as evidenced by self-report in therapy sessions (Anxiety) LTG: Be a more people person Increase frequency at which pt is actively engaging in social activities. (Anxiety)  Progress Towards Goals: Progressing  Interventions: CBT, Solution Focused, and Supportive  Summary: Phillip Gill is a 48 y.o. male with past psych history of MDD and GAD, presenting for follow up therapy session in efforts to improve management of depressive and anxious symptoms.  Patient actively engaged in session, presenting in overall depressed moods, and congruent affect, brightening in periods throughout.  Patient actively engaged in introductory check-in, sharing of things being Same old, further detailing of continued physical pain experienced re plantar fasciatus as well as ongoing neck pain. Explored pt's efforts in navigating challenges, having maintained medical appointments, learning of insurance having denied MRI sharing of uncertainty for  justification of denial, engaging in chart review, determining denial being due to having no recent efforts of PT or exercise regimen, processing actionable means of addressing presenting barriers.  Pt further engaged in review of recent weeks and observances in moods, exploring observed improvements in moods over recent weeks, sharing of having received inheritance from father's estate, sharing of having proven to pay towards outstanding bills and needs, further sharing of noticing challenges in moods yesterday, sharing of it being mother's birthday, reporting of having avoided interactions with others and stayed to self, reporting of ongoing feelings of sadness surrounding loss in 2018, processing challenges coping with loss.  Revisited previous discussions surrounding potential benefits of enhanced tx via IOP or PHP, noting of continued lack of interest, sharing of in-person requirement being increasingly demanding with residing in [place], processing potential arrangements to stay with brother in [place], continuing to identify reasons as to why enhanced tx would not be of benefit for himself, including lack of interest during prior efforts, increased anxiousness when around others, and anticipation of feeling other members would be watching him. Further engaged in exploration of avoidance cycle, and the challenges presenting by repeatedly avoiding anxiety provoking situations, leading to experiencing increased anxiousness when experiencing similar situations again. Pt proved rigid in perspectives surrounding continued avoidance proving beneficial.     05/05/2024    9:40 AM 04/02/2024   11:34 AM 03/25/2024    9:32 AM 01/16/2024   10:19 AM  GAD 7 : Generalized Anxiety Score  Nervous, Anxious, on Edge 3 3 3 3   Control/stop worrying 3 3 3 3   Worry too much - different things 3 3 3 3   Trouble relaxing 3 2 3  0  Restless 3 2 0 1  Easily annoyed or irritable 3 3 3 3   Afraid - awful might happen 3 3 3  1   Total GAD 7 Score 21 19 18 14   Anxiety Difficulty Extremely difficult Extremely difficult Extremely difficult Extremely difficult      05/05/2024    9:40 AM 04/02/2024   11:34 AM 03/25/2024    9:34 AM 02/18/2024    9:25 AM 01/16/2024   10:53 AM  Depression screen PHQ 2/9  Decreased Interest 3 3 3 3 3   Down, Depressed, Hopeless 3 3 3 3 3   PHQ - 2 Score 6 6 6 6 6   Altered sleeping 3 3 3  3   Tired, decreased energy 3 3 3  3   Change in appetite 3 3 3  3   Feeling bad or failure about yourself  3 3 3  3   Trouble concentrating 3 3 3  3   Moving slowly or fidgety/restless 3 3 3  1   Suicidal thoughts 3 3 0  3  PHQ-9 Score 27  27  24   25    Difficult doing work/chores Extremely dIfficult Extremely dIfficult Extremely dIfficult  Extremely dIfficult     Data saved with a previous flowsheet row definition   Suicidal/Homicidal: None, No plan to harm self or others  Therapist Response:  Clinician actively greeted pt upon presenting for visit, assessing presenting moods and affect, exploring today's events and presenting moods. Further engaged utilizing open ended questions in eliciting recounts of events of the past month, exploring any presenting new stressors, ongoing challenges, and factors contributing to presenting moods.  Utilized active listening techniques to provide support and validation of pt's expressed thoughts and feelings, further processing observed impact on moods, individual means of navigating challenges, and exploring potential action steps pt may explore in managing challenges. Revisited discussion surrounding potential benefit of higher LOC, exploring thoughts and perspectives, challenging irrational thoughts in aims of identifying more realistic perspectives. Clinician utilized CBT, MI, solution focused, psycho-ed, and supportive reflection interventions to support pt in navigation presenting sxs.   [x]  Cognitive Challenging [x]  Cognitive Refocusing [x]  Cognitive Reframing  []   Communication Skills []  Compliance Issues []  DBT [x]  Exploration of Coping Patterns [x]  Exploration of Emotions []  Exploration of Relationship Patterns  [x]  Guided Imagery []  Interactive Feedback []  Interpersonal Resolutions  []  Mindfulness Training []  Preventative Services [x]  Psycho-Education []  Relaxation/Deep Breathing []  Review of Treatment Plan/Progress [x]  Role-Play/Behavioral Rehearsal  [x]  Structured Problem Solving [x]  Supportive Reflection []  Symptom Management  []  Other   Patient responded well to interventions. Patient continues to meet criteria for MDD and GAD. Patient will continue to benefit from engagement in outpatient therapy due to being the least restrictive service to meet presenting needs.   Plan: Return again in 4 weeks.  Diagnosis:  Encounter Diagnoses  Name Primary?   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  Patient/Guardian was advised Release of Information must be obtained prior to any record release in order to collaborate their care with an outside provider. Patient/Guardian was advised if they have not already done so to contact the registration department to sign all necessary forms in order for us  to release information regarding their care.   Consent: Patient/Guardian gives verbal consent for treatment and assignment of benefits for services provided during this visit. Patient/Guardian expressed understanding and agreed to proceed.   Lynwood JONETTA Maris, MSW, LCSW 05/20/2024,  3:04 PM

## 2024-05-22 ENCOUNTER — Encounter (HOSPITAL_COMMUNITY): Payer: MEDICAID | Admitting: Student

## 2024-05-25 ENCOUNTER — Other Ambulatory Visit (HOSPITAL_COMMUNITY): Payer: Self-pay | Admitting: *Deleted

## 2024-05-25 ENCOUNTER — Encounter (HOSPITAL_COMMUNITY): Payer: Self-pay

## 2024-05-25 DIAGNOSIS — R079 Chest pain, unspecified: Secondary | ICD-10-CM

## 2024-05-27 ENCOUNTER — Ambulatory Visit (HOSPITAL_COMMUNITY): Payer: MEDICAID

## 2024-06-02 ENCOUNTER — Other Ambulatory Visit: Payer: Self-pay

## 2024-06-02 ENCOUNTER — Other Ambulatory Visit (HOSPITAL_COMMUNITY): Payer: Self-pay

## 2024-06-02 ENCOUNTER — Other Ambulatory Visit (HOSPITAL_COMMUNITY): Payer: Self-pay | Admitting: Student

## 2024-06-02 ENCOUNTER — Other Ambulatory Visit: Payer: Self-pay | Admitting: Family Medicine

## 2024-06-02 ENCOUNTER — Other Ambulatory Visit: Payer: Self-pay | Admitting: Podiatry

## 2024-06-02 ENCOUNTER — Ambulatory Visit: Payer: MEDICAID | Admitting: Family Medicine

## 2024-06-02 VITALS — BP 130/86 | HR 67 | Temp 97.0°F

## 2024-06-02 DIAGNOSIS — N1831 Chronic kidney disease, stage 3a: Secondary | ICD-10-CM | POA: Diagnosis not present

## 2024-06-02 DIAGNOSIS — E1122 Type 2 diabetes mellitus with diabetic chronic kidney disease: Secondary | ICD-10-CM

## 2024-06-02 DIAGNOSIS — Z7985 Long-term (current) use of injectable non-insulin antidiabetic drugs: Secondary | ICD-10-CM

## 2024-06-02 DIAGNOSIS — E6609 Other obesity due to excess calories: Secondary | ICD-10-CM

## 2024-06-02 DIAGNOSIS — E66811 Obesity, class 1: Secondary | ICD-10-CM | POA: Diagnosis not present

## 2024-06-02 DIAGNOSIS — I10 Essential (primary) hypertension: Secondary | ICD-10-CM

## 2024-06-02 DIAGNOSIS — E1129 Type 2 diabetes mellitus with other diabetic kidney complication: Secondary | ICD-10-CM

## 2024-06-02 DIAGNOSIS — E559 Vitamin D deficiency, unspecified: Secondary | ICD-10-CM

## 2024-06-02 DIAGNOSIS — R351 Nocturia: Secondary | ICD-10-CM

## 2024-06-02 DIAGNOSIS — M5010 Cervical disc disorder with radiculopathy, unspecified cervical region: Secondary | ICD-10-CM

## 2024-06-02 DIAGNOSIS — Z6832 Body mass index (BMI) 32.0-32.9, adult: Secondary | ICD-10-CM

## 2024-06-02 DIAGNOSIS — R809 Proteinuria, unspecified: Secondary | ICD-10-CM

## 2024-06-02 DIAGNOSIS — F332 Major depressive disorder, recurrent severe without psychotic features: Secondary | ICD-10-CM

## 2024-06-02 LAB — COMPREHENSIVE METABOLIC PANEL WITH GFR
ALT: 15 U/L (ref 0–53)
AST: 18 U/L (ref 0–37)
Albumin: 4.4 g/dL (ref 3.5–5.2)
Alkaline Phosphatase: 80 U/L (ref 39–117)
BUN: 15 mg/dL (ref 6–23)
CO2: 32 meq/L (ref 19–32)
Calcium: 9.8 mg/dL (ref 8.4–10.5)
Chloride: 98 meq/L (ref 96–112)
Creatinine, Ser: 1.3 mg/dL (ref 0.40–1.50)
GFR: 65.23 mL/min (ref >60.00)
Glucose, Bld: 214 mg/dL — ABNORMAL HIGH (ref 70–99)
Potassium: 3.3 meq/L — ABNORMAL LOW (ref 3.5–5.1)
Sodium: 138 meq/L (ref 135–145)
Total Bilirubin: 0.4 mg/dL (ref 0.2–1.2)
Total Protein: 8.1 g/dL (ref 6.0–8.3)

## 2024-06-02 LAB — HEMOGLOBIN A1C: Hgb A1c MFr Bld: 6.8 % — ABNORMAL HIGH (ref 4.6–6.5)

## 2024-06-02 LAB — MICROALBUMIN / CREATININE URINE RATIO
Creatinine,U: 346.6 mg/dL
Microalb Creat Ratio: 4.8 mg/g (ref 0.0–30.0)
Microalb, Ur: 1.6 mg/dL (ref 0.0–1.9)

## 2024-06-02 MED ORDER — FINASTERIDE 5 MG PO TABS
5.0000 mg | ORAL_TABLET | Freq: Every day | ORAL | 1 refills | Status: AC
  Filled 2024-06-02 – 2024-06-10 (×2): qty 30, 30d supply, fill #0
  Filled 2024-06-22 – 2024-07-14 (×2): qty 30, 30d supply, fill #1

## 2024-06-02 MED ORDER — OZEMPIC (2 MG/DOSE) 8 MG/3ML ~~LOC~~ SOPN
2.0000 mg | PEN_INJECTOR | SUBCUTANEOUS | 3 refills | Status: AC
  Filled 2024-06-02: qty 9, 84d supply, fill #0
  Filled 2024-06-22 – 2024-07-14 (×2): qty 9, 84d supply, fill #1

## 2024-06-02 MED ORDER — FREESTYLE LIBRE 3 SENSOR MISC
1.0000 | Freq: Every day | 4 refills | Status: DC
  Filled 2024-06-02: qty 2, 28d supply, fill #0

## 2024-06-02 MED ORDER — CELECOXIB 200 MG PO CAPS
200.0000 mg | ORAL_CAPSULE | Freq: Two times a day (BID) | ORAL | 3 refills | Status: AC | PRN
  Filled 2024-06-02: qty 90, 45d supply, fill #0
  Filled 2024-06-22 – 2024-07-14 (×2): qty 90, 45d supply, fill #1

## 2024-06-02 MED ORDER — SPIRONOLACTONE 25 MG PO TABS
25.0000 mg | ORAL_TABLET | Freq: Every day | ORAL | 3 refills | Status: AC
  Filled 2024-06-02: qty 90, 90d supply, fill #0
  Filled 2024-06-22 – 2024-07-14 (×2): qty 90, 90d supply, fill #1

## 2024-06-02 MED ORDER — VITAMIN D (ERGOCALCIFEROL) 1.25 MG (50000 UNIT) PO CAPS
50000.0000 [IU] | ORAL_CAPSULE | ORAL | 3 refills | Status: AC
  Filled 2024-06-02: qty 12, 84d supply, fill #0
  Filled 2024-06-22 – 2024-07-28 (×3): qty 12, 84d supply, fill #1

## 2024-06-02 MED ORDER — CYCLOBENZAPRINE HCL 5 MG PO TABS
5.0000 mg | ORAL_TABLET | Freq: Three times a day (TID) | ORAL | 0 refills | Status: DC | PRN
  Filled 2024-06-02: qty 60, 10d supply, fill #0

## 2024-06-02 MED ORDER — OLMESARTAN-AMLODIPINE-HCTZ 40-10-25 MG PO TABS
1.0000 | ORAL_TABLET | Freq: Every day | ORAL | 3 refills | Status: AC
  Filled 2024-06-02 – 2024-07-14 (×4): qty 90, 90d supply, fill #0

## 2024-06-02 NOTE — Patient Instructions (Signed)
 It was very nice to see you today!  VISIT SUMMARY: You had a follow-up visit to manage your diabetes, chronic kidney disease, and other health concerns. Your diabetes is well-controlled, and your blood pressure is stable. We discussed your ongoing foot pain, new leg swelling, and other issues.  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 -Continue taking Ozempic  2 mg weekly. -We ordered a blood test for A1c and a urine test for microalbumin and creatinine ratio. -Your Ozempic  prescription has been refilled for 360 days.  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 -We ordered a urine test for microalbumin and creatinine ratio and a blood test for Apo A1 and B level ratio.  ESSENTIAL HYPERTENSION: Your blood pressure is well-controlled with your current medication. -Continue taking your current blood pressure medication (olmesartan -amlodipine -HCTZ 40-10-25 mg daily). -Your blood pressure medication has been refilled for an annual supply.  BILATERAL LOWER EXTREMITY EDEMA: You have new swelling in both legs. -Start taking spironolactone 25 mg once daily. -We ordered lab work in 2 weeks to monitor your kidney function. -Continue wearing compression hose and elevating your legs.  OBESITY, CLASS 1: Your weight loss has plateaued. -Increase your physical activity once your foot pain is resolved.  VITAMIN D  DEFICIENCY: Your vitamin D  levels are stable. -Continue taking vitamin D  50,000 units weekly. -Your vitamin D  prescription has been refilled.  GASTROESOPHAGEAL REFLUX DISEASE: You have heartburn, which may be worsened by Celebrex . -Continue taking Nexium  40 mg twice daily and Pepcid  40 mg at night. -Monitor for heartburn symptoms with Celebrex  use.  FOOT PAIN, UNDER EVALUATION: Your foot  pain persists and may be worsened by wearing boots. -Increase Celebrex  to twice daily. -Continue follow-up with Dr. Janit for further evaluation.    Take care, Arvella Hummer, MD, MS   PLEASE NOTE:  If you had any lab tests, please let us  know if you have not heard back within a few days. You may see your results on mychart before we have a chance to review them but we will give you a call once they are reviewed by us .   If we ordered any referrals today, please let us  know if you have not heard from their office within the next week.   If you had any urgent prescriptions sent in today, please check with the pharmacy within an hour of our visit to make sure the prescription was transmitted appropriately.   Please try these tips to maintain a healthy lifestyle:  Eat at least 3 REAL meals and 1-2 snacks per day.  Aim for no more than 5 hours between eating.  If you eat breakfast, please do so within one hour of getting up.   Each meal should contain half fruits/vegetables, one quarter protein, and one quarter carbs (no bigger than a computer mouse)  Cut down on sweet beverages. This includes juice, soda, and sweet tea.   Drink at least 1 glass of water with each meal and aim for at least 8 glasses per day  Exercise at least 150 minutes every week.

## 2024-06-02 NOTE — Progress Notes (Signed)
 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 Last A1c was 6.3% in August 2025. No polyuria or polydipsia reported. Chronic kidney disease stage 3A with proteinuria noted. Renal function was slightly elevated last month. - Continue Ozempic  2 mg weekly - Ordered A1c by blood - Ordered urine microalbumin and creatinine ratio - Refilled Ozempic  for 360 days  Chronic kidney disease stage 3A with proteinuria Chronic kidney disease stage 3A with proteinuria. Renal function was slightly elevated last month. - Ordered urine microalbumin and creatinine ratio - Ordered Apo A1 and B level ratio  Essential hypertension Well-controlled with current medication regimen. Blood pressure today is 130/86 mmHg. - Continue current antihypertensives (olmesartan -amlodipine -HCTZ 40-10-25 daily) - Refilled blood pressure medication for annual supply  Bilateral lower extremity edema New onset bilateral lower extremity edema. Blood pressure is stable, allowing for the addition of a diuretic. - Started spironolactone  25 mg once daily - Ordered lab work in 2 weeks to monitor kidney function - Advised wearing compression hose and elevating legs  Obesity, class 1 Obesity, class 1. Weight loss was noted initially with Ozempic  but has plateaued. Fluid reduction may aid in weight management. - Encouraged increased physical activity once foot pain is resolved  Vitamin D  deficiency Vitamin D  deficiency, last checked in September. Currently stable. - Continue vitamin D  50,000 units weekly - Refilled vitamin D  prescription  Gastroesophageal reflux disease Managed with Nexium  40 mg twice daily and Pepcid  40 mg at night. Celebrex  may exacerbate heartburn. - Continue Nexium  40 mg twice daily - Continue Pepcid  40 mg at night - Advised monitoring  for heartburn symptoms with Celebrex  use  Foot pain, under evaluation Foot pain persists, possibly exacerbated by wearing boots. Celebrex  is not providing adequate relief. MRI was denied by insurance. Appointment with Dr. Janit scheduled for further evaluation. - Increase Celebrex  to twice daily - Continue follow up with Dr. Janit for further evaluation       There are no discontinued medications.  Return in about 3 months (around 08/31/2024) for BP, DM, (labs in 2 weeks around 06/15/2024).        Subjective:   Encounter date: 06/02/2024  Phillip Gill is a 48 y.o. male who has Anxiety; Other constipation; Anxiety and depression; Fibromyalgia muscle pain; Anal condyloma; Gynecomastia; HTN (hypertension); Chronic hepatitis B (HCC); LGV (lymphogranuloma venereum); Primary syphilis; Gastroenteritis; HIV (human immunodeficiency virus infection) (HCC); Tobacco abuse; Overweight; Chronic pain associated with significant psychosocial dysfunction; Gonorrhea; Human immunodeficiency virus (HIV) disease (HCC); Foot lesion; Hepatitis B surface antigen positive; Healthcare maintenance; Erectile dysfunction; Cervical pain; Human monkeypox; Encounter for physical examination related to employment; Snoring; Urinary frequency; Left foot pain; Poor social situation; MDD (major depressive disorder); Passive suicidal ideations; OSA on CPAP; MDD (major depressive disorder), recurrent severe, without psychosis (HCC); Gastroesophageal reflux disease; Neck mass; Nocturia; Carpal tunnel syndrome on both sides; Hyponatremia; AKI (acute kidney injury); Type 2 diabetes mellitus with hyperglycemia (HCC); Proteinuria due to type 2 diabetes mellitus (HCC); Uncontrolled type 2 diabetes mellitus with hyperglycemia (HCC); Leukocytosis; Hyperlipidemia; Plantar fascial fibromatosis of both feet; Elevated sed rate; Food insecurity; and Chest pain of uncertain etiology on their problem list..   He  has a past medical history  of Anxiety, Arthritis, At increased risk for cardiovascular disease (11/21/2023), Chronic hepatitis B (HCC), Chronic lower back pain, Depression, Drug-induced nausea and vomiting (02/06/2024), Fibromyalgia, Genital warts, HIV  disease (HCC) (02/28/2015), HIV infection (HCC), Hypertension, IBS (irritable bowel syndrome), Migraine, Overweight (07/20/2015), and Renal insufficiency (12/29/2013).[name]   He presents with chief complaint of Follow-up (Patient states nothing to discuss. ) .  Discussed the use of AI scribe software for clinical note transcription with the patient, who gave verbal consent to proceed.  History of Present Illness Phillip Gill is a 48 year old male with diabetes and CKD 3 who presents for a diabetes follow-up.  Diabetes mellitus - Managed with Ozempic  2 mg weekly - Last hemoglobin A1c was 6.3 in August 2025 - No polyuria or polydipsia  Chronic kidney disease - Creatinine level was 145 at last visit - No mention of worsening renal symptoms  Hypertension - Blood pressure is well controlled at 130/86 mmHg - Takes olmesartan -amlodipine -HCTZ 40-10-25 mg daily  Peripheral neuropathy and foot pain - Ongoing foot issues, worsened by wearing boots - MRI was denied by insurance - Uses Celebrex  200 mg once daily, may take twice daily - Uses Lyrica  for neuropathy  Lower extremity edema - New onset bilateral leg swelling - Manages with compression hose and leg elevation  Gastroesophageal reflux disease and chest pain - Experiences heartburn - Takes Nexium  40 mg twice daily and Pepcid  40 mg at night - Chest pain is mild and intermittent - Insurance denied stress test; perfusion scan rescheduled due to machine issues  Vitamin d  deficiency - Stable, last checked in September - Takes 50,000 units weekly  Recent gastrointestinal infection - Stool test in October detected astrovirus - No lasting symptoms  Immunization concerns - Concerned about flu shot  containing pork and seeks alternatives  ROS  Past Surgical History:  Procedure Laterality Date   CO2 LASER APPLICATION N/A 02/11/2013   Procedure: CO2 LASER APPLICATION;  Surgeon: Bernarda Ned, MD;  Location: [facility];  Service: General;  Laterality: N/A;   FOOT SURGERY     HIGH RESOLUTION ANOSCOPY N/A 02/11/2013   Procedure: HIGH RESOLUTION ANOSCOPY WITH BIOPSY, LASER ABLATION;  Surgeon: Bernarda Ned, MD;  Location: [facility];  Service: General;  Laterality: N/A;   WISDOM TOOTH EXTRACTION      Current Outpatient Medications on File Prior to Visit  Medication Sig Dispense Refill   ARIPiprazole  (ABILIFY ) 5 MG tablet Take 1 tablet (5 mg total) by mouth daily. 90 tablet 0   bictegravir-emtricitabine -tenofovir  AF (BIKTARVY ) 50-200-25 MG TABS tablet TAKE 1 TABLET BY MOUTH EVERY DAY 30 tablet 5   Blood Glucose Monitoring Suppl (BLOOD GLUCOSE MONITOR SYSTEM) w/Device KIT Use to check blood sugar in the morning, at noon, and at bedtime. 1 kit 0   buPROPion  ER (WELLBUTRIN  SR) 100 MG 12 hr tablet Take 1 tablet (100 mg total) by mouth daily. 90 tablet 0   celecoxib  (CELEBREX ) 200 MG capsule Take 1 capsule (200 mg total) by mouth 2 (two) times daily as needed (pain). Take with food 60 capsule 0   Continuous Glucose Sensor (FREESTYLE LIBRE 3 SENSOR) MISC Place 1 sensor on the skin every 14 days. Use to check glucose continuously 2 each 4   cyclobenzaprine  (FLEXERIL ) 5 MG tablet Take 1-2 tablets (5-10 mg total) by mouth 3 (three) times daily as needed for muscle spasms. 60 tablet 0   Elastic Bandages & Supports (CARPAL TUNNEL WRIST STABILIZER) MISC 1 each by Does not apply route as needed. 1 each 0   Elastic Bandages & Supports (MEDICAL COMPRESSION STOCKINGS) MISC 1 each by Does not apply route daily. 1 each 0  escitalopram  (LEXAPRO ) 20 MG tablet Take 1 tablet (20 mg total) by mouth daily. 90 tablet 0   esomeprazole  (NEXIUM ) 40 MG capsule Take 1 capsule (40 mg total)  by mouth 2 (two) times daily before a meal. 180 capsule 3   famotidine  (PEPCID ) 40 MG tablet Take 1 tablet (40 mg total) by mouth at bedtime. 60 tablet 0   finasteride  (PROSCAR ) 5 MG tablet Take 1 tablet (5 mg total) by mouth daily. 30 tablet 1   meloxicam  (MOBIC ) 15 MG tablet Take 1 tablet (15 mg total) by mouth daily. 60 tablet 1   methylPREDNISolone  (MEDROL  DOSEPAK) 4 MG TBPK tablet Take as directed on pack. 21 tablet 0   Olmesartan -amLODIPine -HCTZ 40-10-25 MG TABS Take 1 tablet by mouth daily. 90 tablet 3   pregabalin  (LYRICA ) 75 MG capsule Take 1 capsule (75 mg total) by mouth 2 (two) times daily as needed (nerve pain). 60 capsule 3   rosuvastatin  (CRESTOR ) 10 MG tablet Take 1 tablet (10 mg total) by mouth daily. 30 tablet 5   Semaglutide , 2 MG/DOSE, (OZEMPIC , 2 MG/DOSE,) 8 MG/3ML SOPN Inject 2 mg as directed once a week. 9 mL 0   sildenafil  (VIAGRA ) 100 MG tablet Take 0.5-1 tablets (50-100 mg total) by mouth daily as needed for erectile dysfunction. Do not exceed 100 mg in a day 30 tablet 1   Vitamin D , Ergocalciferol , (DRISDOL ) 1.25 MG (50000 UNIT) CAPS capsule Take 1 capsule (50,000 Units total) by mouth every 7 (seven) days. 8 capsule 0   diclofenac  Sodium (VOLTAREN ) 1 % GEL Apply 4 grams topically 4 (four) times daily to affected joint. (Patient not taking: Reported on 05/05/2024) 100 g 11   No current facility-administered medications on file prior to visit.    Family History  Problem Relation Age of Onset   Hypertension Mother    Diabetes Mother    Stroke Mother        cerbral aneurysm   Pancreatic cancer Mother    Colon cancer Father    Hypertension Brother    Sleep apnea Cousin    Mental illness Neg Hx    Stomach cancer Neg Hx    Esophageal cancer Neg Hx     Social History   Socioeconomic History   Marital status: Single    Spouse name: Not on file   Number of children: Not on file   Years of education: Not on file   Highest education level: 12th grade   Occupational History   Not on file  Tobacco Use   Smoking status: Former    Current packs/day: 0.00    Average packs/day: 1 pack/day for 18.0 years (18.0 ttl pk-yrs)    Types: Cigarettes    Start date: 07/03/2003    Quit date: 07/02/2021    Years since quitting: 2.9    Passive exposure: Never   Smokeless tobacco: Former  Advertising Account Planner   Vaping status: Former   Quit date: 12/01/2022   Substances: Nicotine   Substance and Sexual Activity   Alcohol use: Yes    Comment: occasional use   Drug use: No   Sexual activity: Not Currently    Partners: Male    Comment: accepted condoms  Other Topics Concern   Not on file  Social History Narrative   Grew up in [place], now living in [place],-  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    Finished HS.   Doing online classes with [employer]- studying accounting   Previously 2 years of classes at [employer].    Has Partner- [name]- together for 1 year.    Has 2 girls- (born in 2000).          Social Drivers of Health   Financial Resource Strain: High Risk (06/02/2024)   Overall Financial Resource Strain (CARDIA)    Difficulty of Paying Living Expenses: Very hard  Food Insecurity: Food Insecurity Present (06/02/2024)   Hunger Vital Sign    Worried About Running Out of Food in the Last Year: Often true    Ran Out of Food in the Last Year: Often true  Transportation Needs: No Transportation Needs (06/02/2024)   PRAPARE - Administrator, Civil Service (Medical): No    Lack of Transportation (Non-Medical): No  Physical Activity: Inactive (06/02/2024)   Exercise Vital Sign    Days of Exercise per Week: 0 days    Minutes of Exercise per Session: Not on file  Stress: Stress Concern Present (06/02/2024)   [employer] of Occupational Health - Occupational Stress Questionnaire    Feeling of Stress: Very much  Social Connections: Socially Isolated (06/02/2024)   Social Connection and Isolation Panel    Frequency of  Communication with Friends and Family: Twice a week    Frequency of Social Gatherings with Friends and Family: Never    Attends Religious Services: More than 4 times per year    Active Member of [employer] or Organizations: No    Attends Banker Meetings: Not on file    Marital Status: Never married  Intimate Partner Violence: Not At Risk (10/12/2023)   Humiliation, Afraid, Rape, and Kick questionnaire    Fear of Current or Ex-Partner: No    Emotionally Abused: No    Physically Abused: No    Sexually Abused: No                                                                                                  Objective:  Physical Exam: BP 130/86   Pulse 67   Temp (!) 97 F (36.1 C) (Oral)   SpO2 96%    Physical Exam Constitutional:      Appearance: Normal appearance.  HENT:     Head: Normocephalic and atraumatic.     Right Ear: Hearing normal.     Left Ear: Hearing normal.     Nose: Nose normal.  Eyes:     General: No scleral icterus.       Right eye: No discharge.        Left eye: No discharge.     Extraocular Movements: Extraocular movements intact.  Cardiovascular:     Rate and Rhythm: Normal rate and regular rhythm.     Heart sounds: Normal heart sounds.  Pulmonary:     Effort: Pulmonary effort is normal.     Breath sounds: Normal breath sounds.  Abdominal:     Palpations: Abdomen is soft.     Tenderness: There is no abdominal tenderness.  Skin:    General: Skin is warm.     Findings: No rash.  Neurological:     General: No  focal deficit present.     Mental Status: He is alert.     Cranial Nerves: No cranial nerve deficit.  Psychiatric:        Mood and Affect: Mood normal.        Behavior: Behavior normal.        Thought Content: Thought content normal.        Judgment: Judgment normal.     DG Cervical Spine 2 or 3 views Result Date: 03/18/2024 CLINICAL DATA:  Left C7 radicular pain. EXAM: CERVICAL SPINE - 2-3 VIEW COMPARISON:  Cervical spine  radiograph 12/19/2022 FINDINGS: Straightening of the normal cervical lordosis. Preservation of the vertebral body heights. Degenerative disc disease most pronounced C4-5, C5-6 and C6-7. Prevertebral soft tissues unremarkable. Lateral masses articulate appropriately with the dens. Lung apices clear. IMPRESSION: Degenerative disc disease most pronounced C4-5, C5-6 and C6-7. Electronically Signed   By: Bard Moats M.D.   On: 03/18/2024 21:08   DG Foot Complete Left Result Date: 03/09/2024 Please see detailed radiograph report in office note.   Recent Results (from the past 2160 hours)  Thyroid  Panel With TSH     Status: None   Collection Time: 04/02/24 11:32 AM  Result Value Ref Range   T3 Uptake 30 22 - 35 %   T4, Total 8.2 4.9 - 10.5 mcg/dL   Free Thyroxine Index 2.5 1.4 - 3.8   TSH 1.83 0.40 - 4.50 mIU/L  CBC w/Diff     Status: None   Collection Time: 04/02/24 11:32 AM  Result Value Ref Range   WBC 8.8 4.0 - 10.5 K/uL   RBC 4.75 4.22 - 5.81 Mil/uL   Hemoglobin 13.9 13.0 - 17.0 g/dL   HCT 58.4 60.9 - 47.9 %   MCV 87.3 78.0 - 100.0 fl   MCHC 33.4 30.0 - 36.0 g/dL   RDW 85.2 88.4 - 84.4 %   Platelets 223.0 150.0 - 400.0 K/uL   Neutrophils Relative % 53.1 43.0 - 77.0 %   Lymphocytes Relative 39.8 12.0 - 46.0 %   Monocytes Relative 6.0 3.0 - 12.0 %   Eosinophils Relative 0.8 0.0 - 5.0 %   Basophils Relative 0.3 0.0 - 3.0 %   Neutro Abs 4.7 1.4 - 7.7 K/uL   Lymphs Abs 3.5 0.7 - 4.0 K/uL   Monocytes Absolute 0.5 0.1 - 1.0 K/uL   Eosinophils Absolute 0.1 0.0 - 0.7 K/uL   Basophils Absolute 0.0 0.0 - 0.1 K/uL  Gliadin antibodies, serum     Status: None   Collection Time: 04/02/24 11:32 AM  Result Value Ref Range   Gliadin IgA <1.0 U/mL    Comment: Value          Interpretation -----          -------------- <15.0          Antibody not detected > or = 15.0    Antibody detected .    Deamidated Gliadin Abs, IgG <1.0 U/mL    Comment: Value          Interpretation -----           -------------- <15.0          Antibody not detected > or = 15.0    Antibody detected .   Tissue transglutaminase, IgA     Status: None   Collection Time: 04/02/24 11:32 AM  Result Value Ref Range   (tTG) Ab, IgA <1.0 U/mL    Comment: Value  Interpretation -----          -------------- <15.0          Antibody not detected > or = 15.0    Antibody detected .   Sedimentation rate     Status: Abnormal   Collection Time: 04/02/24 11:32 AM  Result Value Ref Range   Sed Rate 32 (H) 0 - 15 mm/hr  Celiac Pnl 2 rflx Endomysial Ab Ttr     Status: None   Collection Time: 04/02/24 11:42 AM  Result Value Ref Range   Deamidated Gliadin Abs, IgG <1.0 U/mL    Comment: . [name] Units Value      Interpretation -----------      -------------- <15.0            Antibody not detected > or = 15.0      Antibody detected    Gliadin IgA <1.0 U/mL    Comment: . [name] Units Value      Interpretation -----------      -------------- <15.0            Antibody not detected > or = 15.0      Antibody detected    (tTG) Ab, IgG <1.0 U/mL    Comment: . [name] Units Value      Interpretation -----------      -------------- <15.0            Antibody not detected > or = 15.0      Antibody detected    (tTG) Ab, IgA <1.0 U/mL    Comment: . [name] Units Value      Interpretation -----------      -------------- <15.0            Antibody not detected > or = 15.0      Antibody detected    Endomysial Ab IgA NEGATIVE NEGATIVE   Endomysial Titer CANCELED     Comment: . Test Not Performed. Screening test Negative or Not Detected. Titer not performed.  Result canceled by the ancillary.    Immunoglobulin A 161 47 - 310 mg/dL  GI Profile, Stool, PCR     Status: Abnormal   Collection Time: 04/10/24 10:48 AM  Result Value Ref Range   Campylobacter Not Detected Not Detected   C difficile toxin A/B Not Detected Not Detected   Plesiomonas shigelloides Not Detected Not Detected   Salmonella Not Detected Not Detected    Vibrio Not Detected Not Detected   Vibrio cholerae Not Detected Not Detected   Yersinia enterocolitica Not Detected Not Detected   Enteroaggregative E coli Not Detected Not Detected   Enteropathogenic E coli Not Detected Not Detected   Enterotoxigenic E coli Not Detected Not Detected   Shiga-toxin-producing E coli Not Detected Not Detected   E coli O157 Not applicable Not Detected   Shigella/Enteroinvasive E coli Not Detected Not Detected   Cryptosporidium Not Detected Not Detected   Cyclospora cayetanensis Not Detected Not Detected   Entamoeba histolytica Not Detected Not Detected   Giardia lamblia Not Detected Not Detected   Adenovirus F 40/41 Not Detected Not Detected   Astrovirus Detected (A) Not Detected   Norovirus GI/GII Not Detected Not Detected   Rotavirus A Not Detected Not Detected   Sapovirus Not Detected Not Detected  CALPROTECTIN     Status: Abnormal   Collection Time: 04/10/24 10:49 AM   Specimen: STOOL  Result Value Ref Range   Calprotectin 374 (H) mcg/g    Comment:  Reference Range:                                       <50     Normal                                       50-120  Borderline                                       >120    Elevated . Calprotectin in Crohn's disease and ulcerative colitis can be five to several thousand times above the reference population (50 mcg/g or less). Levels are usually 50 mcg/g or less in healthy patients and with irritable bowel syndrome. Repeat testing in 4-6 weeks is suggested for borderline values.   Fecal occult blood, imunochemical     Status: None   Collection Time: 04/10/24 10:49 AM   Specimen: STOOL  Result Value Ref Range   Fecal Occult Bld Negative Negative  Fecal lactoferrin, quant     Status: Abnormal   Collection Time: 04/10/24 10:49 AM  Result Value Ref Range   MICRO NUMBER: [id number]    SPECIMEN QUALITY: Adequate    Source STOOL    STATUS: FINAL    Fecal  Lactoferrin Positive (A)     Comment: Positive   COMMENT:      Lactoferrin in the stool is a marker for fecal leukocytes and is a non-specific indicator of intestinal inflammation that may be detected in patients with acute infectious colitis or inflammatory bowel disease. The diagnosis of an acute infectious  process or active IBD cannot be established solely on the basis of a positive result. This test may not be appropriate for immunocompromised persons. In addition, this test is not FDA cleared for patients with a history of HIV and/or Hepatitis B and C,  patients with a history of infectious diarrhea (within 6 months), and patients having had a colostomy and/or ileostomy within 1 month.   T-helper cell (CD4)- ([facility] clinic only)     Status: None   Collection Time: 04/22/24 10:37 AM  Result Value Ref Range   CD4 T Cell Abs 1,415 400 - 1,790 /uL   CD4 % Helper T Cell 35 33 - 65 %    Comment: Performed at [facility], [address] [place], [place] [address]  Urine cytology ancillary only     Status: None   Collection Time: 04/22/24 10:37 AM  Result Value Ref Range   Neisseria Gonorrhea Negative    Chlamydia Negative    Comment Normal Reference Ranger Chlamydia - Negative    Comment      Normal Reference Range Neisseria Gonorrhea - Negative  RPR     Status: None   Collection Time: 04/22/24 10:46 AM  Result Value Ref Range   RPR Ser Ql NON-REACTIVE NON-REACTIVE    Comment: . No laboratory evidence of syphilis. If recent exposure is suspected, submit a new sample in 2-4 weeks. .   Lipid panel     Status: Abnormal   Collection Time: 04/22/24 10:46 AM  Result Value Ref Range   Cholesterol 142 <200 mg/dL   HDL 39 (L) > OR = 40 mg/dL   Triglycerides 879 <  150 mg/dL   LDL Cholesterol (Calc) 81 mg/dL (calc)    Comment: Reference range: <100 . Desirable range <100 mg/dL for primary prevention;   <70 mg/dL for patients with CHD or diabetic patients  with > or = 2  CHD risk factors. [name] LDL-C is now calculated using the Martin-Hopkins  calculation, which is a validated novel method providing  better accuracy than the Friedewald equation in the  estimation of LDL-C.  Gladis APPLETHWAITE et al. SANDREA. 7986;689(80): 2061-2068  (http://education.[facility].com/faq/FAQ164)    Total CHOL/HDL Ratio 3.6 <5.0 (calc)   Non-HDL Cholesterol (Calc) 103 <130 mg/dL (calc)    Comment: For patients with diabetes plus 1 major ASCVD risk  factor, treating to a non-HDL-C goal of <100 mg/dL  (LDL-C of <29 mg/dL) is considered a therapeutic  option.   HIV-1 RNA quant-no reflex-bld     Status: Abnormal   Collection Time: 04/22/24 10:46 AM  Result Value Ref Range   HIV 1 RNA Quant 32 (H) NOT DETECTED copies/mL   HIV-1 RNA Quant, Log 1.51 (H) NOT DETECTED Log copies/mL    Comment: . This test was performed using Real-Time Polymerase Chain Reaction. . Reportable Range: 20 copies/mL to 10,000,000 copies/mL (1.30 log copies/mL to 7.00 log copies/mL).   COMPLETE METABOLIC PANEL WITHOUT GFR     Status: Abnormal   Collection Time: 04/22/24 10:46 AM  Result Value Ref Range   Glucose, Bld 112 (H) 65 - 99 mg/dL    Comment: .            Fasting reference interval . For someone without known diabetes, a glucose value between 100 and 125 mg/dL is consistent with prediabetes and should be confirmed with a follow-up test. .    BUN 11 7 - 25 mg/dL   Creat 8.54 (H) 9.39 - 1.29 mg/dL   BUN/Creatinine Ratio 8 6 - 22 (calc)   Sodium 140 135 - 146 mmol/L   Potassium 3.9 3.5 - 5.3 mmol/L   Chloride 99 98 - 110 mmol/L   CO2 31 20 - 32 mmol/L   Calcium  9.7 8.6 - 10.3 mg/dL   Total Protein 7.9 6.1 - 8.1 g/dL   Albumin 4.6 3.6 - 5.1 g/dL   Globulin 3.3 1.9 - 3.7 g/dL (calc)   AG Ratio 1.4 1.0 - 2.5 (calc)   Total Bilirubin 0.4 0.2 - 1.2 mg/dL   Alkaline phosphatase (APISO) 84 36 - 130 U/L   AST 25 10 - 40 U/L   ALT 24 9 - 46 U/L  CBC with Differential/Platelet     Status:  Abnormal   Collection Time: 04/22/24 10:46 AM  Result Value Ref Range   WBC 12.4 (H) 3.8 - 10.8 Thousand/uL   RBC 5.07 4.20 - 5.80 Million/uL   Hemoglobin 15.1 13.2 - 17.1 g/dL   HCT 55.0 61.4 - 49.9 %   MCV 88.6 80.0 - 100.0 fL   MCH 29.8 27.0 - 33.0 pg   MCHC 33.6 32.0 - 36.0 g/dL    Comment: For adults, a slight decrease in the calculated MCHC value (in the range of 30 to 32 g/dL) is most likely not clinically significant; however, it should be interpreted with caution in correlation with other red cell parameters and the patient's clinical condition.    RDW 14.3 11.0 - 15.0 %   Platelets 279 140 - 400 Thousand/uL   MPV 11.1 7.5 - 12.5 fL   Neutro Abs 6,994 1,500 - 7,800 cells/uL   Absolute Lymphocytes 4,526 (H) 850 -  3,900 cells/uL   Absolute Monocytes 806 200 - 950 cells/uL   Eosinophils Absolute 25 15 - 500 cells/uL   Basophils Absolute 50 0 - 200 cells/uL   Neutrophils Relative % 56.4 %   Total Lymphocyte 36.5 %   Monocytes Relative 6.5 %   Eosinophils Relative 0.2 %   Basophils Relative 0.4 %  Hepatitis B core antibody, total     Status: Abnormal   Collection Time: 04/22/24 10:46 AM  Result Value Ref Range   Hep B Core Total Ab REACTIVE (A) NON-REACTIVE    Comment: . For additional information, please refer to  http://education.[facility].com/faq/FAQ202  (This link is being provided for informational/ educational purposes only.) .   Hepatitis B surface antibody,qualitative     Status: None   Collection Time: 04/22/24 10:46 AM  Result Value Ref Range   Hep B S Ab NON-REACTIVE NON-REACTIVE  Hepatitis B DNA, ultraquantitative, PCR     Status: None   Collection Time: 04/22/24 10:46 AM  Result Value Ref Range   Hepatitis B DNA NOT DETECTED NOT DETECTED IU/mL   Hepatitis B virus DNA NOT DETECTED NOT DETECTED Log IU/mL    Comment: . This test was performed using Real-Time Polymerase Chain Reaction. . Reportable Range: 10 IU/mL to 1,000,000,000  IU/mL. (1.00 Log IU/mL to 9.00 Log IU/mL). .   Hepatitis B surface antigen     Status: None   Collection Time: 04/22/24 10:46 AM  Result Value Ref Range   Hepatitis B Surface Ag NON-REACTIVE NON-REACTIVE    Comment: . For additional information, please refer to  http://education.[facility].com/faq/FAQ202  (This link is being provided for informational/ educational purposes only.) .         Beverley KATHEE Hummer, MD  I,Emily Lagle,acting as a scribe for Beverley KATHEE Hummer, MD.,have documented all relevant documentation on the behalf of Beverley KATHEE Hummer, MD.  LILLETTE Beverley KATHEE Hummer, MD, have reviewed all documentation for this visit. The documentation on 06/02/2024 for the exam, diagnosis, procedures, and orders are all accurate and complete.

## 2024-06-03 ENCOUNTER — Other Ambulatory Visit (HOSPITAL_COMMUNITY): Payer: Self-pay

## 2024-06-03 LAB — APO A1 + B + RATIO
Apolipo. B/A-1 Ratio: 0.6 ratio (ref 0.0–0.7)
Apolipoprotein A-1: 119 mg/dL (ref 101–178)
Apolipoprotein B: 72 mg/dL (ref <90)

## 2024-06-03 MED ORDER — METHYLPREDNISOLONE 4 MG PO TBPK
ORAL_TABLET | ORAL | 0 refills | Status: DC
  Filled 2024-06-03: qty 21, 6d supply, fill #0

## 2024-06-03 MED ORDER — BUPROPION HCL ER (SR) 100 MG PO TB12
100.0000 mg | ORAL_TABLET | Freq: Every day | ORAL | 0 refills | Status: DC
  Filled 2024-06-03 – 2024-06-22 (×2): qty 90, 90d supply, fill #0

## 2024-06-04 ENCOUNTER — Other Ambulatory Visit (HOSPITAL_COMMUNITY): Payer: Self-pay

## 2024-06-04 ENCOUNTER — Ambulatory Visit: Payer: MEDICAID | Admitting: Family Medicine

## 2024-06-04 VITALS — BP 118/80 | HR 84 | Ht 72.0 in | Wt 252.0 lb

## 2024-06-04 DIAGNOSIS — M5412 Radiculopathy, cervical region: Secondary | ICD-10-CM

## 2024-06-04 NOTE — Patient Instructions (Addendum)
 Thank you for coming in today.   I have re-ordered the Epidural Steroid Injection for your neck. **please let me know if your do NOT hear about scheduling.

## 2024-06-04 NOTE — Progress Notes (Signed)
 Phillip Ileana Collet, PhD, LAT, ATC acting as a scribe for Artist Lloyd, MD.  Phillip Gill is a 48 y.o. male who presents to [facility] today for cont'd cervical radiculitis. Pt was last seen by Dr. Lloyd on 03/11/24 and a cervical ESI was ordered, prescribed Lyrica , and given a Toradol  IM injection. ESI was never performed.  Today, pt reports cont'd neck pain. He notes the cervical ESI was denied by his insurance. He is now having intermittent radiating pain into his L arm w/ n/t. He didn't think he was taking the Lyrica , as previously prescribed.  Dx testing:  03/11/24 C-spine XR 03/26/23 C-spine MRI 12/19/22 C-spine XR   Pertinent review of systems: No fevers or chills  Relevant historical information: Hypertension and diabetes   Exam:  BP 118/80   Pulse 84   Ht 6' (1.829 m)   Wt 252 lb (114.3 kg)   SpO2 94%   BMI 34.18 kg/m  General: Well Developed, well nourished, and in no acute distress.   MSK: C-spine decreased cervical motion upper extremity strength is intact.    Lab and Radiology Results  EXAM: MRI CERVICAL SPINE WITHOUT CONTRAST   TECHNIQUE: Multiplanar, multisequence MR imaging of the cervical spine was performed. No intravenous contrast was administered.   COMPARISON:  Prior MRI from 01/04/2012.   FINDINGS: Examination degraded by motion artifact.   Alignment: Straightening with slight reversal of the normal cervical lordosis. No significant listhesis.   Vertebrae: Vertebral body height maintained without acute or chronic fracture. Bone marrow signal intensity within normal limits. No discrete or worrisome osseous lesions. No abnormal marrow edema.   Cord: Normal signal and morphology.   Posterior Fossa, vertebral arteries, paraspinal tissues: Unremarkable.   Disc levels:   C2-C3: Small central disc protrusion indents the ventral thecal sac. No significant spinal stenosis. Foramina remain patent.   C3-C4: Mild  disc bulge with bilateral uncovertebral spurring. Flattening and partial effacement of the ventral thecal sac without significant spinal stenosis. Mild right C4 foraminal narrowing. Left neural foramina remains patent.   C4-C5: Diffuse disc bulge with endplate and uncovertebral spurring. Flattening and partial effacement of the ventral thecal sac. Superimposed mild right to moderate right-sided facet hypertrophy. No significant spinal stenosis. Moderate right C5 foraminal narrowing. Left neural foramina remains patent.   C5-C6: Diffuse disc bulge with bilateral uncovertebral spurring, worse on the left. Flattening and partial effacement of the ventral thecal sac without significant spinal stenosis. Severe left with moderate right C6 foraminal narrowing.   C6-C7: Mild disc bulge with uncovertebral spurring. No spinal stenosis. Mild bilateral C7 foraminal narrowing.   C7-T1: Negative interspace. Mild left-sided facet hypertrophy. No canal or foraminal stenosis.   IMPRESSION: 1. Multilevel cervical spondylosis without significant spinal stenosis or overt neural impingement. 2. Multifactorial degenerative changes with resultant multilevel foraminal narrowing as above. Notable findings include moderate right C5 foraminal stenosis, severe left with moderate right C6 foraminal narrowing, with mild bilateral C7 foraminal stenosis. 3. Moderate right facet arthrosis at C4-5.     Electronically Signed   By: Morene Hoard M.D.   On: 04/01/2023 04:16 I, Artist Lloyd, personally (independently) visualized and performed the interpretation of the images attached in this note.      Assessment and Plan: 48 y.o. male with several radiculopathy.  Plan for epidural steroid injection as previously ordered.  Stressed the importance of contacting me if there is any problems so that we can fix them.   PDMP not reviewed  this encounter. Orders Placed This Encounter  Procedures   DG INJECT  DIAG/THERA/INC NEEDLE/CATH/PLC EPI/CERV/THOR W/IMG    Standing Status:   Future    Expiration Date:   06/04/2025    Reason for Exam (SYMPTOM  OR DIAGNOSIS REQUIRED):   cervical radiculopathy    Preferred Imaging Location?:   [facility]   No orders of the defined types were placed in this encounter.    Discussed warning signs or symptoms. Please see discharge instructions. Patient expresses understanding.   The above documentation has been reviewed and is accurate and complete Artist Lloyd, M.D.

## 2024-06-05 ENCOUNTER — Encounter: Payer: Self-pay | Admitting: Internal Medicine

## 2024-06-05 ENCOUNTER — Encounter (HOSPITAL_COMMUNITY): Payer: MEDICAID | Admitting: Student

## 2024-06-05 ENCOUNTER — Other Ambulatory Visit (HOSPITAL_COMMUNITY): Payer: Self-pay

## 2024-06-06 ENCOUNTER — Encounter (HOSPITAL_COMMUNITY): Payer: Self-pay

## 2024-06-07 ENCOUNTER — Ambulatory Visit: Payer: Self-pay | Admitting: Family Medicine

## 2024-06-08 ENCOUNTER — Ambulatory Visit: Payer: MEDICAID | Attending: Internal Medicine

## 2024-06-08 DIAGNOSIS — R079 Chest pain, unspecified: Secondary | ICD-10-CM | POA: Diagnosis not present

## 2024-06-08 LAB — ECHOCARDIOGRAM COMPLETE
AR max vel: 3.4 cm2
AV Peak grad: 9 mmHg
Ao pk vel: 1.5 m/s
Area-P 1/2: 8.07 cm2
Calc EF: 67.2 %
S' Lateral: 2.4 cm
Single Plane A2C EF: 62.6 %
Single Plane A4C EF: 70.2 %

## 2024-06-10 ENCOUNTER — Encounter: Payer: Self-pay | Admitting: Podiatry

## 2024-06-10 ENCOUNTER — Other Ambulatory Visit (HOSPITAL_BASED_OUTPATIENT_CLINIC_OR_DEPARTMENT_OTHER): Payer: Self-pay

## 2024-06-10 ENCOUNTER — Other Ambulatory Visit: Payer: Self-pay

## 2024-06-10 ENCOUNTER — Telehealth: Payer: Self-pay

## 2024-06-10 ENCOUNTER — Ambulatory Visit (INDEPENDENT_AMBULATORY_CARE_PROVIDER_SITE_OTHER): Payer: MEDICAID | Admitting: Podiatry

## 2024-06-10 ENCOUNTER — Other Ambulatory Visit (HOSPITAL_COMMUNITY): Payer: Self-pay

## 2024-06-10 VITALS — Ht 72.0 in | Wt 252.0 lb

## 2024-06-10 DIAGNOSIS — M722 Plantar fascial fibromatosis: Secondary | ICD-10-CM | POA: Diagnosis not present

## 2024-06-10 DIAGNOSIS — R079 Chest pain, unspecified: Secondary | ICD-10-CM

## 2024-06-10 MED ORDER — MELOXICAM 15 MG PO TABS
15.0000 mg | ORAL_TABLET | Freq: Every day | ORAL | 1 refills | Status: AC
  Filled 2024-06-10 – 2024-07-14 (×3): qty 60, 60d supply, fill #0

## 2024-06-10 NOTE — Progress Notes (Signed)
 Chief Complaint  Patient presents with   Plantar Fasciitis    Pt is here to f/u on left foot due to plantar fasciitis, states to still have pain to the foot, states his MRI was denied not sure why, states he is still taking medication that was prescribed for the pain.    HPI: 48 y.o. male presenting today for follow-up evaluation of left arch pain.  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  Onset around the beginning of July 2025.  Progressively has gotten worse.  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 Efrain- sees him every 4 months   Hypertension    IBS (irritable bowel syndrome)    Migraine    none in years (02/16/2015   Overweight 07/20/2015   Renal insufficiency 12/29/2013    Past Surgical History:  Procedure Laterality Date   CO2 LASER APPLICATION N/A 02/11/2013   Procedure: CO2 LASER APPLICATION;  Surgeon: Bernarda Ned, MD;  Location: [facility];  Service: General;  Laterality: N/A;   FOOT SURGERY     HIGH RESOLUTION ANOSCOPY N/A 02/11/2013   Procedure: HIGH RESOLUTION ANOSCOPY WITH BIOPSY, LASER ABLATION;  Surgeon: Bernarda Ned, MD;  Location: [facility];  Service: General;  Laterality: N/A;   WISDOM TOOTH EXTRACTION      Allergies  Allergen Reactions   Bactrim [Sulfamethoxazole W/Trimethoprim (Co-Trimoxazole)] Hives and Shortness Of Breath   Sulfa Antibiotics Hives and Shortness Of Breath   Cymbalta  [Duloxetine  Hcl] Diarrhea   Duloxetine  Diarrhea   Metformin  And Related Nausea And  Vomiting   Other Hives and Swelling    [employer] toothpaste    Porcine (Pork) Protein-Containing Drug Products Other (See Comments)    Pt refused to eat pork products.   Zoloft [Sertraline Hcl] Diarrhea   Clindamycin /Lincomycin Rash   Neurontin  [Gabapentin ] Rash     Physical Exam: General: The patient is alert and oriented x3 in no acute distress.  Dermatology: Skin is warm, dry and supple bilateral lower extremities.   Vascular: Palpable pedal pulses bilaterally. Capillary refill within normal limits.  No appreciable edema.  No erythema.  Neurological: Grossly intact via light touch  Musculoskeletal Exam: Unchanged.  No pedal deformities noted.  Significant pain along the plantar arch just proximal to the sesamoid apparatus of the first MTP around the area where the plantar fibroma was removed  Radiographic Exam LT foot 03/09/2024:  Normal osseous mineralization. Joint spaces preserved.  No fractures or osseous irregularities noted.  Impression: Negative  Assessment/Plan of Care: 1.  Plantar fasciitis left 2.  History of excision of plantar fibroma LT foot.  DOS: 02/25/2020  -Patient evaluated.   - Patient states that the cam boot does not help alleviate any of his arch pain.  Okay to discontinue.  Recommend good supportive tennis shoes and sneakers -Prescription for meloxicam  15 mg daily -Declined cortisone injection today -Return to clinic 6 weeks   Thresa EMERSON Sar, DPM [facility]  Dr. Thresa EMERSON Sar, DPM    [address] [place], [place] [address]                Office [phone]  Fax [phone]

## 2024-06-10 NOTE — Telephone Encounter (Signed)
 [name] his insurance has denied the NM PET CT CARDIAC PERFUSION MULTI W/ABSOLUTE BLOODFLOW testing.  Phillip Gill can you add Lexisan order? Thank you   Spoke with patient advised him that Cardiac PET was denied and Lexiscan has been order per Dr. Mallipeddi. Advised him that I will send instructions to MyChart and someone from scheduling will call and make appointment for test. Patient verbalized understanding

## 2024-06-11 ENCOUNTER — Ambulatory Visit (INDEPENDENT_AMBULATORY_CARE_PROVIDER_SITE_OTHER): Payer: MEDICAID | Admitting: Licensed Clinical Social Worker

## 2024-06-11 ENCOUNTER — Ambulatory Visit: Payer: Self-pay | Admitting: Internal Medicine

## 2024-06-11 ENCOUNTER — Other Ambulatory Visit (HOSPITAL_COMMUNITY): Payer: Self-pay

## 2024-06-11 ENCOUNTER — Other Ambulatory Visit: Payer: Self-pay

## 2024-06-11 DIAGNOSIS — F411 Generalized anxiety disorder: Secondary | ICD-10-CM | POA: Diagnosis not present

## 2024-06-11 DIAGNOSIS — F332 Major depressive disorder, recurrent severe without psychotic features: Secondary | ICD-10-CM

## 2024-06-11 NOTE — Progress Notes (Signed)
 Specialty Pharmacy Refill Coordination Note  MyChart Questionnaire Submission  Phillip Gill is a 48 y.o. male contacted today regarding refills of specialty medication(s) Biktarvy .  Doses on hand: 11 tablets per [facility]   Patient requested: (Patient-Rptd) Delivery   Delivery date: 06/17/24  Verified address: [address] [place] [address]  Medication will be filled on 06/16/24

## 2024-06-11 NOTE — Progress Notes (Signed)
 THERAPIST PROGRESS NOTE   Session Date: 06/11/2024  Session Time: 0920 - 1015  Participation Level: Active  MSE/Presentation: Behavior: Appropriate and Sharing Speech: Normal Thought Process: Coherent Cognition: Alert and Appropriate Mood: Negative and Dysphoric Affect: Depressed and Flat Insight: Lacking Appearance: Casual  Type of Therapy: Individual Therapy  Treatment Goals addressed:  Not Progressing (6) LTG: Reduce frequency, intensity, and duration of depression symptoms so that daily functioning is improved (OP Depression) LTG: Increase coping skills to manage depression and improve ability to perform daily activities (OP Depression) LTG: To not be depressed, have more energy, and stop over eating (OP Depression) STG: Report a decrease in anxiety symptoms as evidenced by an overall reduction in anxiety score by a minimum of 25% on the Generalized Anxiety Disorder Scale (GAD-7) (Anxiety) STG: Phillip Gill will reduce frequency of avoidant behaviors by 50% as evidenced by self-report in therapy sessions (Anxiety) LTG: Be a more people person Increase frequency at which pt is actively engaging in social activities. (Anxiety)  Progress Towards Goals: Not Progressing  Interventions: CBT, Solution Focused, and Supportive  Summary: Phillip Gill is a 48 y.o. male with past psych history of MDD and GAD, presenting for follow up therapy session in efforts to improve management of depressive and anxious symptoms.  Patient actively engaged in session, presenting with overall depressed mood and congruent affect.  Patient participated in check-in, reporting discontinuation of the orthopedic boot after being advised it was doing more harm than good. Patient described mood and functioning as about the same, spending most time at home, completing basic household tasks, and isolating to avoid ongoing interpersonal conflict with his aunt. Patient described aunt as consistently negative,  demanding, and a source of drama, expressing of also experiencing challenging interactions with older aunt, noting that aunt repeatedly asks when the patient plans to move out. Patient expressed desire to leave [place], preferring a faster life environment such as [place] , though identifying financial limitations as a barrier.  Patient reported awaiting updates from attorneys regarding disability determination appeals and expressed no interest in returning to any form of employment, stating my mind just wont be there and demonstrating rigidity in dismissing potential vocational options.  Session reviewed recent podiatry and sports medicine appointments. Patient reported podiatrist recommended discontinuing the boot due to lack of benefit and that MRI needing appeal. Sports medicine is also appealing denial of an epidural injection for chronic neck and spine pain, noting of next options being potential surgery.  Patient demonstrated ongoing rigidity in beliefs that isolation is protective and necessary to avoid household conflict. Reassessment using PHQ-9 and GAD-7 reflected continued elevated symptoms, including passive suicidal ideation. Patient endorsed ongoing grief related to the loss of mother in 03/2017 and father in November of 2024.  As part of the annual treatment plan review, patient reported perceiving no meaningful progress toward treatment goals and maintained declined interest in higher levels of care, including IOP or PHP.     06/11/2024    9:59 AM 05/05/2024    9:40 AM 04/02/2024   11:34 AM 03/25/2024    9:32 AM  GAD 7 : Generalized Anxiety Score  Nervous, Anxious, on Edge 3 3 3 3   Control/stop worrying 3 3 3 3   Worry too much - different things 3 3 3 3   Trouble relaxing 3 3 2 3   Restless 3 3 2  0  Easily annoyed or irritable 3 3 3 3   Afraid - awful might happen 3 3 3 3   Total GAD 7 Score 21  21 19 18   Anxiety Difficulty Extremely difficult Extremely difficult Extremely  difficult Extremely difficult      06/11/2024   10:00 AM 05/05/2024    9:40 AM 04/02/2024   11:34 AM 03/25/2024    9:34 AM 02/18/2024    9:25 AM  Depression screen PHQ 2/9  Decreased Interest 3 3 3 3 3   Down, Depressed, Hopeless 3 3 3 3 3   PHQ - 2 Score 6 6 6 6 6   Altered sleeping 3 3 3 3    Tired, decreased energy 3 3 3 3    Change in appetite 3 3 3 3    Feeling bad or failure about yourself  3 3 3 3    Trouble concentrating 3 3 3 3    Moving slowly or fidgety/restless 3 3 3 3    Suicidal thoughts 3 3 3  0   PHQ-9 Score 27 27  27  24     Difficult doing work/chores Extremely dIfficult Extremely dIfficult Extremely dIfficult Extremely dIfficult      Data saved with a previous flowsheet row definition   Suicidal/Homicidal: None, No plan to harm self or others  Therapist Response:  Clinician actively greeted pt upon presenting for visit, assessing presenting moods and affect, engaging in introductory check-in. Utilized open ended questions in eliciting recounts of events of the past month, utilizing active listening techniques to provide support and validation of pt's expressed thoughts, feelings and perspectives. Clinician utilized CBT (cognitive restructuring around avoidance, rigidity, and all-or-nothing beliefs), MI (exploring ambivalence toward behavioral change and treatment engagement), supportive reflection, and psychoeducation on coping with interpersonal stress, grief, and the impacts of prolonged isolation.  [x]  Cognitive Challenging [x]  Cognitive Refocusing [x]  Cognitive Reframing  []  Communication Skills []  Compliance Issues []  DBT [x]  Exploration of Coping Patterns [x]  Exploration of Emotions []  Exploration of Relationship Patterns  [x]  Guided Imagery []  Interactive Feedback []  Interpersonal Resolutions  []  Mindfulness Training []  Preventative Services [x]  Psycho-Education []  Relaxation/Deep Breathing [x]  Review of Treatment Plan/Progress [x]  Role-Play/Behavioral Rehearsal  [x]   Structured Problem Solving [x]  Supportive Reflection [x]  Symptom Management  []  Other   Patient responded well to interventions. Patient continues to meet criteria for MDD and GAD. Patient will continue to benefit from engagement in outpatient therapy due to being the least restrictive service to meet presenting needs.   Plan: Return again in 4 weeks.  Diagnosis:  Encounter Diagnoses  Name Primary?   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  Patient/Guardian was advised Release of Information must be obtained prior to any record release in order to collaborate their care with an outside provider. Patient/Guardian was advised if they have not already done so to contact the registration department to sign all necessary forms in order for us  to release information regarding their care.   Consent: Patient/Guardian gives verbal consent for treatment and assignment of benefits for services provided during this visit. Patient/Guardian expressed understanding and agreed to proceed.   Phillip Gill, MSW, LCSW 06/11/2024,  10:09 AM

## 2024-06-12 ENCOUNTER — Other Ambulatory Visit (HOSPITAL_COMMUNITY): Payer: Self-pay

## 2024-06-12 ENCOUNTER — Encounter (HOSPITAL_COMMUNITY): Payer: Self-pay

## 2024-06-12 NOTE — Telephone Encounter (Signed)
 The patient asked whether the Herlene 3 has been discontinued. I advised him to contact his pharmacy to verify availability. He was instructed to call the office back after speaking with the pharmacy so we can determine the next steps for him.

## 2024-06-15 ENCOUNTER — Encounter (HOSPITAL_COMMUNITY)
Admission: RE | Admit: 2024-06-15 | Discharge: 2024-06-15 | Disposition: A | Discharge status: Home / Self Care | Payer: MEDICAID | Source: Ambulatory Visit | Attending: Internal Medicine | Admitting: Internal Medicine

## 2024-06-15 ENCOUNTER — Other Ambulatory Visit: Payer: Self-pay | Admitting: Physician Assistant

## 2024-06-15 ENCOUNTER — Inpatient Hospital Stay (HOSPITAL_COMMUNITY): Admission: RE | Admit: 2024-06-15 | Discharge: 2024-06-15 | Payer: MEDICAID | Attending: Family Medicine

## 2024-06-15 DIAGNOSIS — R079 Chest pain, unspecified: Secondary | ICD-10-CM | POA: Diagnosis not present

## 2024-06-15 LAB — NM MYOCAR MULTI W/SPECT W/WALL MOTION / EF
Base ST Depression (mm): 0 mm
Estimated workload: 1
LV dias vol: 107 mL (ref 62–150)
LV sys vol: 45 mL (ref 4.2–5.8)
MPHR: 173 {beats}/min
Nuc Stress EF: 58 %
Peak HR: 112 {beats}/min
Percent HR: 64 %
RATE: 0.3
Rest HR: 70 {beats}/min
Rest Nuclear Isotope Dose: 9.8 mCi
SDS: 3
SRS: 0
SSS: 3
ST Depression (mm): 0 mm
Stress Nuclear Isotope Dose: 32 mCi
TID: 1.31

## 2024-06-15 MED ORDER — SODIUM CHLORIDE FLUSH 0.9 % IV SOLN
INTRAVENOUS | Status: AC
  Administered 2024-06-15: 11:00:00 10 mL via INTRAVENOUS
  Filled 2024-06-15: qty 10

## 2024-06-15 MED ORDER — TECHNETIUM TC 99M TETROFOSMIN IV KIT
30.0000 | PACK | Freq: Once | INTRAVENOUS | Status: AC | PRN
  Administered 2024-06-15: 11:00:00 32 via INTRAVENOUS

## 2024-06-15 MED ORDER — TECHNETIUM TC 99M TETROFOSMIN IV KIT
10.0000 | PACK | Freq: Once | INTRAVENOUS | Status: AC | PRN
  Administered 2024-06-15: 10:00:00 9.8 via INTRAVENOUS

## 2024-06-15 MED ORDER — REGADENOSON 0.4 MG/5ML IV SOLN
INTRAVENOUS | Status: AC
  Administered 2024-06-15: 11:00:00 0.4 mg via INTRAVENOUS
  Filled 2024-06-15: qty 5

## 2024-06-15 NOTE — Progress Notes (Signed)
°  ° °  Phillip Gill presented for a Lexiscan  nuclear stress test today.  I Phillip CINDERELLA Kapur, PA-C, provided direct supervision and was present during the stress portion of the study today, which was completed without significant symptoms, immediate complications, or acute ST/T changes on ECG.  Stress imaging is pending at this time.  Preliminary ECG findings may be listed in the chart, but the stress test result will not be finalized until perfusion imaging is complete.  Phillip CINDERELLA Kapur, PA-C  06/15/2024, 11:24 AM

## 2024-06-16 ENCOUNTER — Telehealth (HOSPITAL_COMMUNITY): Payer: Self-pay

## 2024-06-16 ENCOUNTER — Other Ambulatory Visit (HOSPITAL_BASED_OUTPATIENT_CLINIC_OR_DEPARTMENT_OTHER): Payer: Self-pay

## 2024-06-16 ENCOUNTER — Other Ambulatory Visit: Payer: MEDICAID

## 2024-06-16 ENCOUNTER — Other Ambulatory Visit: Payer: Self-pay

## 2024-06-16 ENCOUNTER — Encounter: Payer: Self-pay | Admitting: Sports Medicine

## 2024-06-16 ENCOUNTER — Other Ambulatory Visit (HOSPITAL_COMMUNITY): Payer: Self-pay

## 2024-06-16 DIAGNOSIS — I10 Essential (primary) hypertension: Secondary | ICD-10-CM

## 2024-06-16 LAB — COMPREHENSIVE METABOLIC PANEL WITH GFR
ALT: 19 U/L (ref 0–53)
AST: 21 U/L (ref 5–37)
Albumin: 4.4 g/dL (ref 3.5–5.2)
Alkaline Phosphatase: 87 U/L (ref 39–117)
BUN: 17 mg/dL (ref 6–23)
CO2: 30 meq/L (ref 19–32)
Calcium: 9.8 mg/dL (ref 8.4–10.5)
Chloride: 103 meq/L (ref 96–112)
Creatinine, Ser: 1.26 mg/dL (ref 0.40–1.50)
GFR: 67.71 mL/min (ref >60.00)
Glucose, Bld: 126 mg/dL — ABNORMAL HIGH (ref 70–99)
Potassium: 3.6 meq/L (ref 3.5–5.1)
Sodium: 141 meq/L (ref 135–145)
Total Bilirubin: 0.3 mg/dL (ref 0.2–1.2)
Total Protein: 7.9 g/dL (ref 6.0–8.3)

## 2024-06-16 NOTE — Telephone Encounter (Signed)
 The Freestyle Libre 14 day, 2 and 3 are being discontinued in September 2025. The new version is 2 plus and 3 plus and are available. We are highly encouraging providers to go ahead and make the switch now. That will prevent us  from doing the prior auth on the old version now and then having to do a prior auth on the new version again in a few months. Thanks!

## 2024-06-16 NOTE — Telephone Encounter (Signed)
 Noted

## 2024-06-17 ENCOUNTER — Ambulatory Visit: Payer: Self-pay | Admitting: Internal Medicine

## 2024-06-18 MED FILL — Continuous Glucose System Sensor: 90 days supply | Qty: 6 | Fill #0 | Status: AC

## 2024-06-18 NOTE — Telephone Encounter (Signed)
 Libre 3 plus rx sent

## 2024-06-18 NOTE — Discharge Instructions (Signed)

## 2024-06-19 ENCOUNTER — Inpatient Hospital Stay
Admission: RE | Admit: 2024-06-19 | Discharge: 2024-06-19 | Payer: MEDICAID | Attending: Family Medicine | Admitting: Family Medicine

## 2024-06-19 ENCOUNTER — Telehealth (HOSPITAL_COMMUNITY): Payer: Self-pay | Admitting: Pharmacist

## 2024-06-19 ENCOUNTER — Other Ambulatory Visit (HOSPITAL_COMMUNITY): Payer: Self-pay

## 2024-06-19 ENCOUNTER — Telehealth (HOSPITAL_COMMUNITY): Payer: Self-pay

## 2024-06-19 ENCOUNTER — Other Ambulatory Visit: Payer: Self-pay

## 2024-06-19 DIAGNOSIS — M5412 Radiculopathy, cervical region: Secondary | ICD-10-CM

## 2024-06-19 MED ORDER — IOPAMIDOL (ISOVUE-M 300) INJECTION 61%
1.0000 mL | Freq: Once | INTRAMUSCULAR | Status: AC | PRN
  Administered 2024-06-19: 1 mL via EPIDURAL

## 2024-06-19 MED ORDER — TRIAMCINOLONE ACETONIDE 40 MG/ML IJ SUSP (RADIOLOGY)
60.0000 mg | Freq: Once | INTRAMUSCULAR | Status: AC
  Administered 2024-06-19: 60 mg via EPIDURAL

## 2024-06-19 NOTE — Telephone Encounter (Signed)
 Patient called.  Patient aware. Patient will pick up RX from pharmacy

## 2024-06-19 NOTE — Telephone Encounter (Signed)
 Pharmacy Patient Advocate Encounter  Received notification from [employer] [place] [employer] that Prior Authorization for FreeStyle Libre 3 Plus Sensor  has been APPROVED from 06/19/24 to 06/19/25. Ran test claim, Copay is $0. This test claim was processed through [facility]- copay amounts may vary at other pharmacies due to pharmacy/plan contracts, or as the patient moves through the different stages of their insurance plan.   PA #/Case ID/Reference #: [id number]

## 2024-06-19 NOTE — Telephone Encounter (Signed)
 Pharmacy Patient Advocate Encounter   Received notification from Pt Calls Messages that prior authorization for FreeStyle Libre 3 Plus Sensor  is required/requested.   Insurance verification completed.   The patient is insured through [place] MEDICA[employer].   Per test claim: PA required; PA submitted to above mentioned insurance via Latent Key/confirmation #/EOC [id number] Status is pending

## 2024-06-19 NOTE — Telephone Encounter (Signed)
 PA request has been Received. New Encounter has been or will be created for follow up. For additional info see Pharmacy Prior Auth telephone encounter from 06/19/24.

## 2024-06-19 NOTE — Telephone Encounter (Signed)
 Medication: free style libre 3 sensor Able to fill? No Prior authorization required? Yes Co-pay before assistance: na

## 2024-06-19 NOTE — Addendum Note (Signed)
 Addended by: SEBASTIAN RIGHTER B on: 06/19/2024 12:00 AM   Modules accepted: Orders

## 2024-06-22 ENCOUNTER — Other Ambulatory Visit: Payer: Self-pay | Admitting: Family Medicine

## 2024-06-22 ENCOUNTER — Other Ambulatory Visit: Payer: Self-pay

## 2024-06-22 ENCOUNTER — Other Ambulatory Visit (HOSPITAL_COMMUNITY): Payer: Self-pay

## 2024-06-22 ENCOUNTER — Other Ambulatory Visit (HOSPITAL_COMMUNITY): Payer: Self-pay | Admitting: Student

## 2024-06-22 ENCOUNTER — Other Ambulatory Visit: Payer: Self-pay | Admitting: Podiatry

## 2024-06-22 ENCOUNTER — Other Ambulatory Visit: Payer: Self-pay | Admitting: Internal Medicine

## 2024-06-22 DIAGNOSIS — T50905A Adverse effect of unspecified drugs, medicaments and biological substances, initial encounter: Secondary | ICD-10-CM

## 2024-06-22 DIAGNOSIS — M5010 Cervical disc disorder with radiculopathy, unspecified cervical region: Secondary | ICD-10-CM

## 2024-06-22 DIAGNOSIS — F330 Major depressive disorder, recurrent, mild: Secondary | ICD-10-CM

## 2024-06-22 MED ORDER — FAMOTIDINE 40 MG PO TABS
40.0000 mg | ORAL_TABLET | Freq: Every day | ORAL | 0 refills | Status: AC
  Filled 2024-06-22 – 2024-07-31 (×3): qty 60, 60d supply, fill #0

## 2024-06-22 MED ORDER — ESOMEPRAZOLE MAGNESIUM 40 MG PO CPDR
40.0000 mg | DELAYED_RELEASE_CAPSULE | Freq: Two times a day (BID) | ORAL | 1 refills | Status: AC
  Filled 2024-06-22 – 2024-07-28 (×2): qty 180, 90d supply, fill #0

## 2024-06-22 MED FILL — Continuous Glucose System Sensor: 90 days supply | Qty: 6 | Fill #1 | Status: CN

## 2024-06-22 NOTE — Telephone Encounter (Signed)
 Requesting: CYCLOBENZAPRINE  HCL 5 MG PO TABS Last Visit: 06/02/2024 Next Visit: 08/31/2024 Last Refill: 06/02/2024  Please Advise

## 2024-06-24 ENCOUNTER — Other Ambulatory Visit (HOSPITAL_COMMUNITY): Payer: Self-pay

## 2024-06-24 ENCOUNTER — Other Ambulatory Visit: Payer: Self-pay

## 2024-06-24 MED ORDER — CYCLOBENZAPRINE HCL 5 MG PO TABS
5.0000 mg | ORAL_TABLET | Freq: Three times a day (TID) | ORAL | 0 refills | Status: DC | PRN
  Filled 2024-06-24: qty 60, 10d supply, fill #0

## 2024-06-26 ENCOUNTER — Other Ambulatory Visit (HOSPITAL_COMMUNITY): Payer: Self-pay

## 2024-06-26 ENCOUNTER — Other Ambulatory Visit: Payer: Self-pay

## 2024-06-26 MED ORDER — METHYLPREDNISOLONE 4 MG PO TBPK
ORAL_TABLET | ORAL | 0 refills | Status: AC
  Filled 2024-06-26: qty 21, 6d supply, fill #0

## 2024-06-30 ENCOUNTER — Other Ambulatory Visit (HOSPITAL_COMMUNITY): Payer: Self-pay

## 2024-06-30 ENCOUNTER — Other Ambulatory Visit: Payer: Self-pay

## 2024-06-30 MED ORDER — ESCITALOPRAM OXALATE 20 MG PO TABS
20.0000 mg | ORAL_TABLET | Freq: Every day | ORAL | 0 refills | Status: AC
  Filled 2024-06-30 – 2024-07-14 (×2): qty 90, 90d supply, fill #0

## 2024-07-01 ENCOUNTER — Other Ambulatory Visit: Payer: Self-pay

## 2024-07-06 NOTE — Progress Notes (Signed)
 Diabetes Self-Management Education  Visit Type:  Follow-up  Appt. Start Time: 1409 Appt. End Time: 1445  07/10/2024  [name], identified by name and date of birth, is a 49 y.o. male with a diagnosis of Diabetes:  .     ASSESSMENT Patient is here today alone for follow. Pt denies recent hypoglycemia. Pt reports appropriate treatment options for hypoglycemia. Pt reports he is using the [place] plus 3 sensor. Pt was congratulated on his improved A1C%. Pt states he would like to to focus on weight reductions to a lower BMI range. Pt reports he is no longer working and is filing for disability.   Pt reports intake of 1-2 meals daily stating skipping lunch most days.    Patient reports he continue to lives with his two aunts  Pt reports shared shopping and cooking and states his family rely on him to prepare meals at times. Pt reports family members also have dietary preferences stating picky eater. Pt reports he would like to move out into his own residence in the near future. Pt reports a barrier to physical activity stating financial constraints and lack of adequate space in his current residence. All Pt's questions questions were answered during this encounter.    Lab Results  Component Value Date   HGBA1C 6.8 (H) 06/02/2024  10.3% in april of 2025 per chart   Wt Readings from Last 3 Encounters:  07/10/24 249 lb 12.8 oz (113.3 kg)  06/10/24 252 lb (114.3 kg)  06/04/24 252 lb (114.3 kg)    Diabetes Self-Management Education - 07/10/24 1409       Psychosocial Assessment   Patient Belief/Attitude about Diabetes Motivated to manage diabetes    What is the hardest part about your diabetes right now, causing you the most concern, or is the most worrisome to you about your diabetes?   Making healty food and beverage choices    Self-care barriers Other (comment)   Pt lives with family and states there is a hoarding problem   Self-management support Doctor's office    Patient  Concerns Nutrition/Meal planning    Special Needs None    Preferred Learning Style No preference indicated    Learning Readiness Change in progress      Pre-Education Assessment   Patient understands the diabetes disease and treatment process. Needs Review    Patient understands incorporating nutritional management into lifestyle. Needs Review    Patient undertands incorporating physical activity into lifestyle. Needs Review    Patient understands using medications safely. Demonstrates understanding / competency    Patient understands monitoring blood glucose, interpreting and using results Demonstrates understanding / competency    Patient understands prevention, detection, and treatment of acute complications. Demonstrates understanding / competency    Patient understands prevention, detection, and treatment of chronic complications. Needs Review    Patient understands how to develop strategies to address psychosocial issues. Needs Review    Patient understands how to develop strategies to promote health/change behavior. Needs Review      Complications   Last HgB A1C per patient/outside source 6.8 %    How often do you check your blood sugar? > 4 times/day   CGM   Fasting Blood glucose range (mg/dL) 29-870    Postprandial Blood glucose range (mg/dL) 29-870    Number of hyperglycemic episodes ( >200mg /dL): Rare    Have you had a dilated eye exam in the past 12 months? No    Have you had a dental exam in the  past 12 months? Yes    Are you checking your feet? Yes    How many days per week are you checking your feet? 7      Dietary Intake   Breakfast ~ 2 cup of chicken soup with green beans, peas, tomatoes, water, diet lemonade or eggs, turkey bacon, 2 slice of wheat or white bread    Dinner 2 slices of white or wheat bread, turkey and cheese, mayo or chicken salad with 12-15 ritz cracker    Beverage(s) water, SF lemonade, diet soda      Activity / Exercise   Activity / Exercise Type  ADL's      Patient Education   Previous Diabetes Education Yes      Individualized Goals (developed by patient)   Nutrition Follow meal plan discussed    Physical Activity Exercise 1-2 times per week;15 minutes per day;30 minutes per day    Medications take my medication as prescribed    Monitoring  Consistenly use CGM    Problem Solving Eating Pattern    Reducing Risk examine blood glucose patterns;treat hypoglycemia with 15 grams of carbs if blood glucose less than 70mg /dL;do foot checks daily    Health Coping Ask for help with psychological, social, or emotional issues      Patient Self-Evaluation of Goals - Patient rates self as meeting previously set goals (% of time)   Nutrition >75% (most of the time)    Physical Activity < 25% (hardly ever/never)    Medications >75% (most of the time)    Monitoring >75% (most of the time)    Problem Solving and behavior change strategies  50 - 75 % (half of the time)    Reducing Risk (treating acute and chronic complications) >75% (most of the time)    Health Coping 50 - 75 % (half of the time)      Post-Education Assessment   Patient understands the diabetes disease and treatment process. Comprehends key points    Patient understands incorporating nutritional management into lifestyle. Needs Review    Patient undertands incorporating physical activity into lifestyle. Needs Review    Patient understands using medications safely. Demonstrates understanding / competency    Patient understands monitoring blood glucose, interpreting and using results Demonstrates understanding / competency    Patient understands prevention, detection, and treatment of acute complications. Demonstrates understanding / competency    Patient understands prevention, detection, and treatment of chronic complications. Comprehends key points    Patient understands how to develop strategies to address psychosocial issues. Needs Review    Patient understands how to develop  strategies to promote health/change behavior. Needs Review      Outcomes   Program Status Not Completed      Subsequent Visit   Since your last visit have you continued or begun to take your medications as prescribed? Yes    Since your last visit have you experienced any weight changes? Loss    Weight Loss (lbs) 3    Since your last visit, are you checking your blood glucose at least once a day? Yes          Learning Objective:  Patient will have a greater understanding of diabetes self-management. Patient education plan is to attend individual and/or group sessions per assessed needs and concerns.   Plan:   Patient Instructions  Aim for balanced meals on a schedule:avoid skipping meals   Expected Outcomes:  Demonstrated interest in learning. Expect positive outcomes  Education material provided: My  Plate and Snack sheet [employer] and Recreation locations and available activities, meal planning tool, CGM targets  If problems or questions, patient to contact team via:  Phone  Future DSME appointment: - 6 months

## 2024-07-08 ENCOUNTER — Encounter: Payer: Self-pay | Admitting: Podiatry

## 2024-07-08 NOTE — Progress Notes (Signed)
 "  Office Visit Note  Patient: Phillip Gill             Date of Birth: [birth year]           MRN: [id number]             PCP: Sebastian Beverley NOVAK, MD Referring: Sebastian Beverley NOVAK, MD Visit Date: 07/10/2024 Occupation: Data Unavailable  Subjective:  New Patient (Initial Visit) (Elevated sed rate, Tingling sensation down the left leg that has been going on for about 1 week )  Discussed the use of AI scribe software for clinical note transcription with the patient, who gave verbal consent to proceed.  History of Present Illness Phillip Gill is a 49 year old male who presents for evaluation of abnormal lab results related to inflammation markers.  Patient admits to issues with frequent gastrointestinal issues and has seen a gastroenterologist in the past.  He denies hematochezia but reports frequent bowel movements, approximately three to five times daily. He admits to a GI bug last October, that causes him additional issues.  He has a history of numbness and tingling down one leg, which has persisted for years but has worsened slightly in the past week. This symptom previously improved following an injection in his neck. He admits to being told he had a pinched nerve that was causing this symptom, with MRI confirming the pinched nerve on evaluation.  He was previously treated for syphilis and confirms adherence to his medication regimen for HIV without missed doses. He denies recent infections, nasal congestion, or sinus infections.  He recalls exposure to individuals with influenza around Christmas but did not contract it, as he wore a mask. He denies current symptoms of illness.  He denies any joint pain. He denies any rashes. He denies any change in his health from his baseline, with no new issues at this time. He denies any concerns at this time.    Activities of Daily Living:  Patient reports morning stiffness for 0 minutes.   Patient Denies nocturnal pain.  Difficulty  dressing/grooming: Denies Difficulty climbing stairs: Denies Difficulty getting out of chair: Denies Difficulty using hands for taps, buttons, cutlery, and/or writing: Denies  Review of Systems  Constitutional:  Positive for fatigue.  HENT:  Positive for mouth dryness. Negative for mouth sores.   Eyes:  Negative for dryness.  Respiratory:  Negative for shortness of breath.   Cardiovascular:  Positive for chest pain. Negative for palpitations.  Gastrointestinal:  Positive for diarrhea. Negative for blood in stool and constipation.  Endocrine: Positive for increased urination.  Genitourinary:  Negative for involuntary urination.  Musculoskeletal:  Positive for myalgias and myalgias. Negative for joint pain, gait problem, joint pain, joint swelling, muscle weakness, morning stiffness and muscle tenderness.  Skin:  Negative for color change, rash, hair loss and sensitivity to sunlight.  Allergic/Immunologic: Negative for susceptible to infections.  Neurological:  Positive for headaches. Negative for dizziness.  Hematological:  Negative for swollen glands.  Psychiatric/Behavioral:  Positive for depressed mood. Negative for sleep disturbance. The patient is nervous/anxious.     PMFS History:  Patient Active Problem List   Diagnosis Date Noted   Food insecurity 05/05/2024   Chest pain of uncertain etiology 05/05/2024   Elevated sed rate 04/06/2024   Plantar fascial fibromatosis of both feet 04/02/2024   Uncontrolled type 2 diabetes mellitus with hyperglycemia (HCC) 10/12/2023   Leukocytosis 10/12/2023   Hyperlipidemia 10/12/2023   Proteinuria due to type 2 diabetes mellitus (HCC)  10/10/2023   Carpal tunnel syndrome on both sides 10/08/2023   Hyponatremia 10/08/2023   AKI (acute kidney injury) 10/08/2023   Type 2 diabetes mellitus with hyperglycemia (HCC) 10/08/2023   Nocturia 04/30/2023   Neck mass 04/01/2023   MDD (major depressive disorder), recurrent severe, without psychosis (HCC)  03/05/2023   Gastroesophageal reflux disease 03/05/2023   Passive suicidal ideations 02/05/2023   OSA on CPAP 02/05/2023   MDD (major depressive disorder) 12/19/2022   Urinary frequency 12/18/2022   Left foot pain 12/18/2022   Poor social situation 12/18/2022   Snoring 01/22/2022   Encounter for physical examination related to employment 09/26/2021   Human monkeypox 04/06/2021   Cervical pain 02/07/2021   Erectile dysfunction 08/29/2020   Healthcare maintenance 04/05/2020   Foot lesion 12/24/2019   Human immunodeficiency virus (HIV) disease (HCC) 08/25/2019   Gonorrhea 02/11/2019   Hepatitis B surface antigen positive 09/05/2018   Overweight 07/20/2015   Gastroenteritis 03/08/2015   HIV (human immunodeficiency virus infection) (HCC)    Tobacco abuse    LGV (lymphogranuloma venereum)    Primary syphilis    Chronic hepatitis B (HCC) 10/03/2014   HTN (hypertension)    Gynecomastia 04/30/2014   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   Chronic pain associated with significant psychosocial dysfunction 09/18/2012   Fibromyalgia muscle pain 02/11/2012   Anxiety and depression 12/07/2011   Other constipation 07/16/2011   Anxiety 02/02/2011    Past Medical History:  Diagnosis Date   Anxiety    Arthritis    neck (02/16/2015)   At increased risk for cardiovascular disease 11/21/2023   Chronic hepatitis B (HCC)    SECONDARY TO HIV   Chronic lower back pain    Depression    Diabetes (HCC)    Drug-induced nausea and vomiting 02/06/2024   Fibromyalgia    Genital warts    HIV disease (HCC) 02/28/2015   HIV infection (HCC)    followed by Dr. Efrain- sees him every 4 months   Hypertension    IBS (irritable bowel syndrome)    Migraine    none in years (02/16/2015   Overweight 07/20/2015   Plantar fasciitis    Renal insufficiency 12/29/2013    Family History  Problem Relation Age of Onset   Hypertension Mother    Diabetes Mother    Stroke Mother        cerbral aneurysm   Pancreatic  cancer Mother    Colon cancer Father    Hypertension Brother    Diabetes Maternal Grandmother    Sleep apnea Cousin    Mental illness Neg Hx    Stomach cancer Neg Hx    Esophageal cancer Neg Hx    Past Surgical History:  Procedure Laterality Date   CO2 LASER APPLICATION N/A 02/11/2013   Procedure: CO2 LASER APPLICATION;  Surgeon: Bernarda Ned, MD;  Location: [facility] [place];  Service: General;  Laterality: N/A;   FOOT SURGERY     HIGH RESOLUTION ANOSCOPY N/A 02/11/2013   Procedure: HIGH RESOLUTION ANOSCOPY WITH BIOPSY, LASER ABLATION;  Surgeon: Bernarda Ned, MD;  Location: [facility] [place];  Service: General;  Laterality: N/A;   WISDOM TOOTH EXTRACTION     Social History[1] Social History   Social History Narrative   Grew up in [place], now living in [place],-  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    Finished HS.   Doing online classes with [employer]- studying accounting   Previously 2 years of classes at [employer].    Has  Partner- [name]- together for 1 year.    Has 2 girls- (born in 2000).            Immunization History  Administered Date(s) Administered   Hepatitis A 01/30/2011, 05/22/2011   Hepatitis A, Adult 01/30/2011, 05/22/2011   Hepatitis A, Ped/Adol-2 Dose 01/30/2011, 05/22/2011   Influenza Split 03/19/2012   Influenza Whole 02/15/2011   Influenza, Seasonal, Injecte, Preservative Fre 08/01/2023   Influenza,inj,Quad PF,6+ Mos 04/09/2013, 04/06/2014, 02/28/2015, 04/29/2017, 09/07/2021, 04/26/2022   Influenza-Unspecified 03/19/2012, 04/09/2013, 04/06/2014, 02/28/2015, 04/29/2017, 04/02/2022, 04/26/2022   Meningococcal Mcv4o 04/29/2017, 01/29/2018   PFIZER(Purple Top)SARS-COV-2 Vaccination 07/08/2019, 08/05/2019   PNEUMOCOCCAL CONJUGATE-20 09/07/2021   PPD Test 02/01/2011, 04/22/2015, 04/26/2015, 05/23/2020, 05/27/2020, 08/31/2021, 10/19/2021, 09/02/2023   Pfizer(Comirnaty)Fall Seasonal Vaccine 12 years and older  08/01/2023   Pneumococcal Conjugate-13 09/25/2013   Pneumococcal Polysaccharide-23 02/01/2011, 10/03/2014   Tdap 07/17/2012, 11/21/2023     Objective: Vital Signs: BP 128/80 (BP Location: Left Arm, Patient Position: Sitting, Cuff Size: Normal)   Pulse 76   Temp 98 F (36.7 C)   Resp 13   Ht 6' 0.5 (1.842 m)   Wt 249 lb 12.8 oz (113.3 kg)   BMI 33.41 kg/m    Physical Exam Vitals and nursing note reviewed.  HENT:     Head: Normocephalic and atraumatic.     Nose: Nose normal.  Eyes:     Conjunctiva/sclera: Conjunctivae normal.     Pupils: Pupils are equal, round, and reactive to light.  Pulmonary:     Effort: Pulmonary effort is normal. No respiratory distress.  Skin:    General: Skin is warm and dry.  Neurological:     Mental Status: He is alert. Mental status is at baseline.  Psychiatric:        Mood and Affect: Mood normal.        Behavior: Behavior normal.      Musculoskeletal Exam:   CDAI Exam: CDAI Score: -- Patient Global: --; Provider Global: -- Swollen: 0 ; Tender: 0  Joint Exam 07/10/2024   All documented joints were normal     Investigation: No additional findings.  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 A 20 gauge epidural needle was advanced using loss-of-resistance technique. DIAGNOSTIC EPIDURAL INJECTION Injection of Isovue -M 300 shows a good epidural pattern with spread above and below the level of needle placement, primarily on the right. No vascular opacification is seen. THERAPEUTIC EPIDURAL INJECTION 1.5 ml of Kenalog  40 mixed with 2 ml of normal saline were then instilled. The procedure was well-tolerated, and the  patient was discharged thirty minutes following the injection in good condition. IMPRESSION: Technically successful epidural injection on the right at C7-T1. Electronically Signed   By: Wilkie Lent M.D.   On: 06/19/2024 12:07   NM Myocar Multi W/Spect W/Wall Motion / EF Result Date: 06/15/2024   The study is normal. There are no perfusion defects consistent with prior infarct or current ischemia.  The study is low risk.   No ST deviation was noted.   LV perfusion is normal.   Left ventricular function is normal. Nuclear stress EF: 58%. The left ventricular ejection fraction is normal (55-65%). End diastolic cavity size is normal. Elevated TID, nonspecific finding in absence of clear perfusion defect.  Small mild intensity inferior/inferoapical defect with mild reversibility and normal wall motion. There is significant adjacent gut radiotracer uptake. Likely findigns represent artifact due to variation in guit radiotracer uptake.    Recent Labs: Lab Results  Component Value Date   WBC 12.4 (H) 04/22/2024   HGB 15.1 04/22/2024   PLT 279 04/22/2024   NA 141 06/16/2024   K 3.6 06/16/2024   CL 103 06/16/2024   CO2 30 06/16/2024   GLUCOSE 126 (H) 06/16/2024   BUN 17 06/16/2024   CREATININE 1.26 06/16/2024   BILITOT 0.3 06/16/2024   ALKPHOS 87 06/16/2024   AST 21 06/16/2024   ALT 19 06/16/2024   PROT 7.9 06/16/2024   ALBUMIN 4.4 06/16/2024   CALCIUM  9.8 06/16/2024   GFRAA 79 12/06/2020    Speciality Comments: No specialty comments available.  Procedures:  No procedures performed Allergies: Bactrim [sulfamethoxazole w/trimethoprim (co-trimoxazole)], Sulfa antibiotics, Cymbalta  [duloxetine  hcl], Duloxetine , Metformin  and related, Other, Porcine (pork) protein-containing drug products, Zoloft [sertraline hcl], Clindamycin /lincomycin, and Neurontin  [gabapentin ]   Assessment / Plan:     Visit Diagnoses:  Elevated sed rate  Patient presenting for elevated ESR of 32. This lab was  checked when patient had a documented gastrointestinal virus (+astrovirus and elevated calprotectin from the same date) which is the most likely explanation for this marker elevation.  As discussed with patient, an elevated erythrocyte sedimentation rate (ESR) is a nonspecific marker of inflammation. It can be elevated in a variety of conditions, including infections and inflammatory diseases. Non-rheumatic causes of elevated ESR include chronic kidney disease, anemia, and pregnancy. ESR can also be influenced by age and sex. It is important to interpret the ESR in the context of the patient's overall clinical picture and other laboratory findings.   Furthermore, patient's ESR 3 months ago was actually decreased from his prior ESR, which appears to be chronically elevated in the setting of his multiple co-morbidities (hepatitis B, HIV, syphilis, etc).   As discussed with patient, I expect that his baseline inflammatory markers will be mildly elevated, but will re-check them today for completion. Plan: Sedimentation rate, C-reactive protein.  However, patient has absolutely no s/s or evidence of a rheumatologic autoimmune condition, and there is a reasonable explanation behind his elevated ESR in October w/ noted infection. Patient verbalizes understanding.  Orders: Orders Placed This Encounter  Procedures   Sedimentation rate   C-reactive protein   No orders of the defined types were placed in this encounter.  I personally spent a total of 60 minutes in the care of the patient today including preparing to see the patient, getting/reviewing separately obtained history, performing a medically appropriate exam/evaluation, counseling and educating, placing orders, documenting clinical information in the EHR, independently interpreting results, and communicating results.   Follow-Up Instructions: No follow-ups on file.   Asberry Claw, DO  Note - This record has been created using Animal nutritionist.   Chart creation errors have been sought, but may not always  have been located. Such creation errors do not reflect on  the standard of medical care.     [1]  Social History Tobacco Use   Smoking status: Former    Current packs/day: 0.00    Average packs/day: 1 pack/day for 18.0 years (18.0 ttl pk-yrs)    Types: Cigarettes    Start date: 07/03/2003    Quit date: 07/02/2021    Years since quitting: 3.0    Passive exposure: Never   Smokeless tobacco: Never  Vaping Use   Vaping status:  Former   Quit date: 12/01/2022   Substances: Nicotine   Substance Use Topics   Alcohol use: Yes    Comment: occasional use   Drug use: No   "

## 2024-07-09 ENCOUNTER — Other Ambulatory Visit: Payer: Self-pay

## 2024-07-09 ENCOUNTER — Ambulatory Visit (HOSPITAL_COMMUNITY): Payer: MEDICAID | Admitting: Licensed Clinical Social Worker

## 2024-07-10 ENCOUNTER — Encounter: Payer: MEDICAID | Attending: Family Medicine | Admitting: Dietician

## 2024-07-10 ENCOUNTER — Ambulatory Visit: Payer: MEDICAID

## 2024-07-10 VITALS — BP 128/80 | HR 76 | Temp 98.0°F | Resp 13 | Ht 72.5 in | Wt 249.8 lb

## 2024-07-10 DIAGNOSIS — A510 Primary genital syphilis: Secondary | ICD-10-CM | POA: Insufficient documentation

## 2024-07-10 DIAGNOSIS — A549 Gonococcal infection, unspecified: Secondary | ICD-10-CM | POA: Insufficient documentation

## 2024-07-10 DIAGNOSIS — E1165 Type 2 diabetes mellitus with hyperglycemia: Secondary | ICD-10-CM | POA: Insufficient documentation

## 2024-07-10 DIAGNOSIS — Z21 Asymptomatic human immunodeficiency virus [HIV] infection status: Secondary | ICD-10-CM | POA: Diagnosis present

## 2024-07-10 DIAGNOSIS — R7689 Other specified abnormal immunological findings in serum: Secondary | ICD-10-CM | POA: Diagnosis present

## 2024-07-10 DIAGNOSIS — R700 Elevated erythrocyte sedimentation rate: Secondary | ICD-10-CM | POA: Insufficient documentation

## 2024-07-10 NOTE — Patient Instructions (Signed)
 Aim for balanced meals on a schedule; avoid skipping meals

## 2024-07-11 LAB — SEDIMENTATION RATE: Sed Rate: 17 mm/h — ABNORMAL HIGH (ref 0–15)

## 2024-07-11 LAB — C-REACTIVE PROTEIN: CRP: 4.1 mg/L

## 2024-07-13 ENCOUNTER — Other Ambulatory Visit: Payer: Self-pay

## 2024-07-13 ENCOUNTER — Other Ambulatory Visit (HOSPITAL_COMMUNITY): Payer: Self-pay

## 2024-07-13 NOTE — Progress Notes (Signed)
 Specialty Pharmacy Refill Coordination Note  MyChart Questionnaire Submission  Phillip Gill is a 49 y.o. male contacted today regarding refills of specialty medication(s) Biktarvy .  Doses on hand: 5   Patient requested: Delivery  Delivery date: 07/15/24  Verified address: [address] [place] [address]  Medication will be filled on 07/14/24

## 2024-07-14 ENCOUNTER — Other Ambulatory Visit: Payer: Self-pay | Admitting: Family

## 2024-07-14 ENCOUNTER — Other Ambulatory Visit: Payer: Self-pay | Admitting: Podiatry

## 2024-07-14 ENCOUNTER — Other Ambulatory Visit: Payer: Self-pay

## 2024-07-14 ENCOUNTER — Other Ambulatory Visit (HOSPITAL_COMMUNITY): Payer: Self-pay | Admitting: Student

## 2024-07-14 ENCOUNTER — Other Ambulatory Visit (HOSPITAL_COMMUNITY): Payer: Self-pay

## 2024-07-14 DIAGNOSIS — F332 Major depressive disorder, recurrent severe without psychotic features: Secondary | ICD-10-CM

## 2024-07-14 DIAGNOSIS — M5010 Cervical disc disorder with radiculopathy, unspecified cervical region: Secondary | ICD-10-CM

## 2024-07-14 DIAGNOSIS — F330 Major depressive disorder, recurrent, mild: Secondary | ICD-10-CM

## 2024-07-14 MED ORDER — CYCLOBENZAPRINE HCL 5 MG PO TABS
5.0000 mg | ORAL_TABLET | Freq: Three times a day (TID) | ORAL | 0 refills | Status: AC | PRN
  Filled 2024-07-14: qty 60, 10d supply, fill #0

## 2024-07-15 ENCOUNTER — Other Ambulatory Visit (HOSPITAL_COMMUNITY): Payer: Self-pay

## 2024-07-15 MED ORDER — BUPROPION HCL ER (SR) 100 MG PO TB12
100.0000 mg | ORAL_TABLET | Freq: Every day | ORAL | 0 refills | Status: AC
  Filled 2024-07-15: qty 90, 90d supply, fill #0

## 2024-07-15 MED ORDER — ARIPIPRAZOLE 5 MG PO TABS
5.0000 mg | ORAL_TABLET | Freq: Every day | ORAL | 0 refills | Status: AC
  Filled 2024-07-15: qty 90, 90d supply, fill #0

## 2024-07-16 ENCOUNTER — Ambulatory Visit (INDEPENDENT_AMBULATORY_CARE_PROVIDER_SITE_OTHER): Payer: MEDICAID | Admitting: Licensed Clinical Social Worker

## 2024-07-16 DIAGNOSIS — F411 Generalized anxiety disorder: Secondary | ICD-10-CM

## 2024-07-16 DIAGNOSIS — F332 Major depressive disorder, recurrent severe without psychotic features: Secondary | ICD-10-CM

## 2024-07-16 DIAGNOSIS — R45851 Suicidal ideations: Secondary | ICD-10-CM

## 2024-07-16 NOTE — Progress Notes (Signed)
 THERAPIST PROGRESS NOTE   Session Date: 07/16/2024  Session Time: 0820 - 0905  Participation Level: Active  MSE/Presentation: Behavior: Appropriate and Sharing Speech: Normal Thought Process: Coherent Cognition: Alert and Appropriate Mood: Negative and Dysphoric Affect: Depressed and Flat Insight: Lacking Appearance: Casual  Type of Therapy: Individual Therapy  Treatment Goals addressed:  Not Progressing (6) LTG: Reduce frequency, intensity, and duration of depression symptoms so that daily functioning is improved (OP Depression) LTG: Increase coping skills to manage depression and improve ability to perform daily activities (OP Depression) LTG: To not be depressed, have more energy, and stop over eating (OP Depression) STG: Report a decrease in anxiety symptoms as evidenced by an overall reduction in anxiety score by a minimum of 25% on the Generalized Anxiety Disorder Scale (GAD-7) (Anxiety) STG: Doniven Vanpatten will reduce frequency of avoidant behaviors by 50% as evidenced by self-report in therapy sessions (Anxiety) LTG: Be a more people person Increase frequency at which pt is actively engaging in social activities. (Anxiety)  Progress Towards Goals: Not Progressing  Interventions: CBT, Solution Focused, and Supportive  Summary: Bo is a 49 y.o. male with past psych history of MDD and GAD, presenting for follow up therapy session in efforts to improve management of depressive and anxious symptoms.  Patient actively engaged in session, presenting with depressed yet pleasant mood and congruent affect throughout.  During check-in, patient reported needing to reschedule the previous session due to jury duty. Patient further explored ongoing physical health concerns, including plantar fasciitis and chronic neck pain, noting he received an injection last month for symptom management. Patient also processed stress related to disability application, sharing that Social Security  has requested additional documentation. Patient expressed uncertainty regarding what specific paperwork is needed, processing the potential benefit of consulting with his disability attorney, who may assist in clarifying needed documentation and obtaining relevant records.  Patient provided recounts of Christmas, noting he did not spend time with family due to several members being ill with the flu. Patient also recounted his birthday, sharing he spent the day driving and avoiding home due to anticipated demands of aunts, preferring time alone. Patient reported traveling to [place] without engaging socially.  Patient engaged in reassessment of symptoms via GAD-7 and PHQ-9, reflecting sustained elevated anxiety and ongoing depressive symptoms. Patient denied passive suicidal ideation over the past two weeks.     07/16/2024    8:48 AM 06/11/2024    9:59 AM 05/05/2024    9:40 AM 04/02/2024   11:34 AM  GAD 7 : Generalized Anxiety Score  Nervous, Anxious, on Edge 3 3 3 3   Control/stop worrying 3 3 3 3   Worry too much - different things 3 3 3 3   Trouble relaxing 3 3 3 2   Restless 3 3 3 2   Easily annoyed or irritable 3 3 3 3   Afraid - awful might happen 3 3 3 3   Total GAD 7 Score 21 21 21 19   Anxiety Difficulty Extremely difficult Extremely difficult Extremely difficult Extremely difficult      07/16/2024    8:50 AM 06/11/2024   10:00 AM 05/05/2024    9:40 AM 04/02/2024   11:34 AM 03/25/2024    9:34 AM  Depression screen PHQ 2/9  Decreased Interest 3 3 3 3 3   Down, Depressed, Hopeless 3 3 3 3 3   PHQ - 2 Score 6 6 6 6 6   Altered sleeping 3 3 3 3 3   Tired, decreased energy 3 3 3 3 3   Change in  appetite 3 3 3 3 3   Feeling bad or failure about yourself  3 3 3 3 3   Trouble concentrating 3 3 3 3 3   Moving slowly or fidgety/restless 1 3 3 3 3   Suicidal thoughts 0 3 3 3  0  PHQ-9 Score 22 27 27  27  24    Difficult doing work/chores Extremely dIfficult Extremely dIfficult Extremely dIfficult  Extremely dIfficult Extremely dIfficult     Data saved with a previous flowsheet row definition   Suicidal/Homicidal: None, No plan to harm self or others  Therapist Response:  Clinician actively greeted pt upon presenting for visit, assessing presenting moods and affect, engaging in introductory check-in. Utilized open ended questions in eliciting recounts of events of the past month, utilizing active listening techniques to provide support and validation of pt's expressed thoughts, feelings and perspectives. Therapist provided supportive counseling, normalization, and cognitive reframing surrounding health-related stressors and disability application concerns. Therapist also assisted patient in problem-solving next steps regarding documentation needs. Patient responded well to interventions. Patient continues to meet criteria for MDD and GAD. Patient will continue to benefit from engagement in outpatient therapy due to being the least restrictive service to meet presenting needs.   [x]  Cognitive Challenging [x]  Cognitive Refocusing []  Cognitive Reframing  []  Communication Skills []  Compliance Issues []  DBT []  Exploration of Coping Patterns []  Exploration of Emotions []  Exploration of Relationship Patterns  []  Guided Imagery []  Interactive Feedback []  Interpersonal Resolutions  []  Mindfulness Training []  Preventative Services [x]  Psycho-Education []  Relaxation/Deep Breathing []  Review of Treatment Plan/Progress []  Role-Play/Behavioral Rehearsal  [x]  Structured Problem Solving [x]  Supportive Reflection []  Symptom Management  []  Other   Plan: Return again in 4 weeks.  Diagnosis:  Encounter Diagnoses  Name Primary?   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  Patient/Guardian was advised Release of Information must be  obtained prior to any record release in order to collaborate their care with an outside provider. Patient/Guardian was advised if they have not already done so to contact the registration department to sign all necessary forms in order for us  to release information regarding their care.   Consent: Patient/Guardian gives verbal consent for treatment and assignment of benefits for services provided during this visit. Patient/Guardian expressed understanding and agreed to proceed.   Lynwood JONETTA Maris, MSW, LCSW 07/16/2024,  8:27 AM

## 2024-07-17 ENCOUNTER — Encounter (HOSPITAL_COMMUNITY): Payer: MEDICAID | Admitting: Student

## 2024-07-22 ENCOUNTER — Other Ambulatory Visit: Payer: Self-pay

## 2024-07-23 ENCOUNTER — Other Ambulatory Visit: Payer: Self-pay | Admitting: Family

## 2024-07-24 NOTE — Telephone Encounter (Signed)
 Patient has PCP.   Chaska Hagger, BSN, RN

## 2024-07-27 ENCOUNTER — Other Ambulatory Visit: Payer: Self-pay | Admitting: Family

## 2024-07-27 ENCOUNTER — Ambulatory Visit: Payer: MEDICAID | Admitting: Podiatry

## 2024-07-28 ENCOUNTER — Ambulatory Visit: Payer: Self-pay

## 2024-07-28 ENCOUNTER — Other Ambulatory Visit: Payer: Self-pay

## 2024-07-28 ENCOUNTER — Other Ambulatory Visit (HOSPITAL_COMMUNITY): Payer: Self-pay

## 2024-07-28 MED ORDER — SILDENAFIL CITRATE 100 MG PO TABS
50.0000 mg | ORAL_TABLET | Freq: Every day | ORAL | 1 refills | Status: AC | PRN
  Filled 2024-07-28 – 2024-07-31 (×4): qty 30, 30d supply, fill #0

## 2024-07-29 ENCOUNTER — Other Ambulatory Visit: Payer: Self-pay

## 2024-07-29 ENCOUNTER — Encounter (HOSPITAL_COMMUNITY): Payer: Self-pay

## 2024-07-29 ENCOUNTER — Emergency Department (HOSPITAL_COMMUNITY)
Admission: EM | Admit: 2024-07-29 | Discharge: 2024-07-30 | Disposition: A | Discharge status: Home / Self Care | Payer: MEDICAID | Attending: Emergency Medicine | Admitting: Emergency Medicine

## 2024-07-29 DIAGNOSIS — Z21 Asymptomatic human immunodeficiency virus [HIV] infection status: Secondary | ICD-10-CM | POA: Diagnosis not present

## 2024-07-29 DIAGNOSIS — R5381 Other malaise: Secondary | ICD-10-CM | POA: Insufficient documentation

## 2024-07-29 DIAGNOSIS — R531 Weakness: Secondary | ICD-10-CM | POA: Diagnosis not present

## 2024-07-29 DIAGNOSIS — Z87891 Personal history of nicotine dependence: Secondary | ICD-10-CM | POA: Diagnosis not present

## 2024-07-29 DIAGNOSIS — I10 Essential (primary) hypertension: Secondary | ICD-10-CM | POA: Insufficient documentation

## 2024-07-29 DIAGNOSIS — E162 Hypoglycemia, unspecified: Secondary | ICD-10-CM | POA: Diagnosis present

## 2024-07-29 DIAGNOSIS — E11649 Type 2 diabetes mellitus with hypoglycemia without coma: Secondary | ICD-10-CM | POA: Diagnosis not present

## 2024-07-29 LAB — CBG MONITORING, ED
Glucose-Capillary: 106 mg/dL — ABNORMAL HIGH (ref 70–99)
Glucose-Capillary: 136 mg/dL — ABNORMAL HIGH (ref 70–99)

## 2024-07-29 NOTE — ED Provider Notes (Signed)
 " [facility]-EMERGENCY DEPT [facility] Emergency Department Provider Note MRN:  [id number]  Arrival date & time: 07/30/24     Chief Complaint   Low blood sugar History of Present Illness   Phillip Gill is a 49 y.o. year-old male with a history of HIV, diabetes presenting to the ED with chief complaint of low blood sugar.  Patient explains that his blood sugar has been dipping low frequently all day today.  His monitoring unit if showing blood sugar dipping down into the 50s frequently today.  Associated with feelings of malaise and jitteriness when it is down in the 50s.  Seems to be able to correct it with food and drink but then it comes back down.  Denies any changes to his diabetes regimen, is taking Ozempic  once a week.  Having some increased frequency of urine, having some frequent stools as well.  No fever.  No cough.  No chest pain or abdominal pain.  Review of Systems  A thorough review of systems was obtained and all systems are negative except as noted in the HPI and PMH.   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 Efrain- sees him every 4 months   Hypertension    IBS (irritable bowel syndrome)    Migraine    none in years (02/16/2015   Overweight 07/20/2015   Plantar fasciitis    Renal insufficiency 12/29/2013    Past Surgical History:  Procedure Laterality Date   CO2 LASER APPLICATION N/A 02/11/2013   Procedure: CO2 LASER APPLICATION;  Surgeon: Bernarda Ned, MD;  Location: [facility];  Service: General;  Laterality: N/A;   FOOT SURGERY     HIGH RESOLUTION  ANOSCOPY N/A 02/11/2013   Procedure: HIGH RESOLUTION ANOSCOPY WITH BIOPSY, LASER ABLATION;  Surgeon: Bernarda Ned, MD;  Location: [facility];  Service: General;  Laterality: N/A;   WISDOM TOOTH EXTRACTION      Family History  Problem Relation Age of Onset   Hypertension Mother    Diabetes Mother    Stroke Mother        cerbral aneurysm   Pancreatic cancer Mother    Colon cancer Father    Hypertension Brother    Diabetes Maternal Grandmother    Sleep apnea Cousin    Mental illness Neg Hx    Stomach cancer Neg Hx    Esophageal cancer Neg Hx     Social History   Socioeconomic History   Marital status: Single    Spouse name: Not on file   Number of children: Not on file   Years of education: Not on file   Highest education level: 12th grade  Occupational History   Not on file  Tobacco Use   Smoking status: Former    Current packs/day: 0.00    Average packs/day: 1 pack/day for 18.0 years (18.0 ttl pk-yrs)    Types: Cigarettes    Start date: 07/03/2003    Quit date: 07/02/2021    Years since  quitting: 3.0    Passive exposure: Never   Smokeless tobacco: Never  Vaping Use   Vaping status: Former   Quit date: 12/01/2022   Substances: Nicotine   Substance and Sexual Activity   Alcohol use: Yes    Comment: occasional use   Drug use: No   Sexual activity: Not Currently    Partners: Male    Comment: accepted condoms  Other Topics Concern   Not on file  Social History Narrative   Grew up in [place], now living in [place],-  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    Finished HS.   Doing online classes with [employer]- studying accounting   Previously 2 years of classes at [employer].    Has Partner- [name]- together for 1 year.    Has 2 girls- (born in 2000).          Social Drivers of Health   Tobacco Use: Medium Risk (07/29/2024)   Patient History    Smoking Tobacco Use: Former    Smokeless Tobacco Use: Never    Passive Exposure:  Never  Physicist, Medical Strain: High Risk (06/02/2024)   Overall Financial Resource Strain (CARDIA)    Difficulty of Paying Living Expenses: Very hard  Food Insecurity: Food Insecurity Present (06/02/2024)   Epic    Worried About Programme Researcher, Broadcasting/film/video in the Last Year: Often true    Ran Out of Food in the Last Year: Often true  Transportation Needs: No Transportation Needs (06/02/2024)   Epic    Lack of Transportation (Medical): No    Lack of Transportation (Non-Medical): No  Physical Activity: Inactive (06/02/2024)   Exercise Vital Sign    Days of Exercise per Week: 0 days    Minutes of Exercise per Session: Not on file  Stress: Stress Concern Present (06/02/2024)   [employer] of Occupational Health - Occupational Stress Questionnaire    Feeling of Stress: Very much  Social Connections: Socially Isolated (06/02/2024)   Social Connection and Isolation Panel    Frequency of Communication with Friends and Family: Twice a week    Frequency of Social Gatherings with Friends and Family: Never    Attends Religious Services: More than 4 times per year    Active Member of [employer] or Organizations: No    Attends Engineer, Structural: Not on file    Marital Status: Never married  Intimate Partner Violence: Not At Risk (10/12/2023)   Humiliation, Afraid, Rape, and Kick questionnaire    Fear of Current or Ex-Partner: No    Emotionally Abused: No    Physically Abused: No    Sexually Abused: No  Depression (PHQ2-9): High Risk (07/16/2024)   Depression (PHQ2-9)    PHQ-2 Score: 22  Alcohol Screen: Low Risk (06/02/2024)   Alcohol Screen    Last Alcohol Screening Score (AUDIT): 2  Housing: High Risk (06/02/2024)   Epic    Unable to Pay for Housing in the Last Year: Patient declined    Number of Times Moved in the Last Year: 0    Homeless in the Last Year: Yes  Utilities: Not At Risk (10/12/2023)   AHC Utilities    Threatened with loss of utilities: No  Health Literacy: Not on file      Physical Exam   Vitals:   07/29/24 2040 07/29/24 2310  BP: (!) 134/106 (!) 134/92  Pulse: 78 87  Resp: 19 (!) 23  Temp: (!) 97.5 F (36.4 C)   SpO2: 96% 99%  CONSTITUTIONAL: Well-appearing, NAD NEURO/PSYCH:  Alert and oriented x 3, no focal deficits EYES:  eyes equal and reactive ENT/NECK:  no LAD, no JVD CARDIO: Regular rate, well-perfused, normal S1 and S2 PULM:  CTAB no wheezing or rhonchi GI/GU:  non-distended, non-tender MSK/SPINE:  No gross deformities, no edema SKIN:  no rash, atraumatic   *Additional and/or pertinent findings included in MDM below  Diagnostic and Interventional Summary    EKG Interpretation Date/Time:  Wednesday July 29 2024 23:10:08 EST Ventricular Rate:  87 PR Interval:  212 QRS Duration:  92 QT Interval:  336 QTC Calculation: 405 R Axis:   47  Text Interpretation: Sinus rhythm Prolonged PR interval Borderline repolarization abnormality Confirmed by Theadore Sharper [phone]) on 07/29/2024 11:49:55 PM       Labs Reviewed  CBC - Abnormal; Notable for the following components:      Result Value   RBC 4.13 (*)    Hemoglobin 12.4 (*)    HCT 38.5 (*)    All other components within normal limits  COMPREHENSIVE METABOLIC PANEL WITH GFR - Abnormal; Notable for the following components:   Creatinine, Ser 1.26 (*)    All other components within normal limits  CBG MONITORING, ED - Abnormal; Notable for the following components:   Glucose-Capillary 136 (*)    All other components within normal limits  CBG MONITORING, ED - Abnormal; Notable for the following components:   Glucose-Capillary 106 (*)    All other components within normal limits  MAGNESIUM   URINALYSIS, ROUTINE W REFLEX MICROSCOPIC    No orders to display    Medications - No data to display   Procedures  /  Critical Care Procedures  ED Course and Medical Decision Making  Initial Impression and Ddx Unclear cause of recurrent hypoglycemia.  Well-appearing in no acute  distress.  On my exam he checks his sugar using his phone and monitoring system, his blood sugar reading is 52.  However when we check his blood sugar 5 minutes later his blood sugar is 100 without any intervention.  Seems that his monitoring system is a bit inaccurate at this time.  Says that he changes the location of the monitor on his skin once every week or 2.  He will change the location of it when he gets home.  Given his symptoms we will check some screening labs to ensure no signs of electrolyte disturbance or other issues.  Past medical/surgical history that increases complexity of ED encounter: Diabetes  Interpretation of Diagnostics I personally reviewed the EKG and my interpretation is as follows: Sinus rhythm without ischemic findings  No significant blood count or electrolyte disturbance.  Patient Reassessment and Ultimate Disposition/Management     Patient continues to look well on reassessment with normal vital signs, with this and reassuring workup plan is for discharge with PCP follow-up.  Patient management required discussion with the following services or consulting groups:  None  Complexity of Problems Addressed Acute illness or injury that poses threat of life of bodily function  Additional Data Reviewed and Analyzed Further history obtained from: Prior labs/imaging results  Additional Factors Impacting ED Encounter Risk None  Sharper HERO. Theadore, MD [facility] [email or web address]  Final Clinical Impressions(s) / ED Diagnoses     ICD-10-CM   1. Weakness  R53.1     2. Malaise  R53.81       ED Discharge Orders     None  Discharge Instructions Discussed with and Provided to Patient:     Discharge Instructions      You were evaluated in the Emergency Department and after careful evaluation, we did not find any emergent condition requiring admission or further testing in the hospital.  Your  exam/testing today is overall reassuring.  Recommend changing the location of your glucose monitor and following up with your primary care doctor as we discussed.  Please return to the Emergency Department if you experience any worsening of your condition.   Thank you for allowing us  to be a part of your care.       Theadore Ozell HERO, MD 07/30/24 0105  "

## 2024-07-29 NOTE — ED Triage Notes (Signed)
 Pt c/o blood sugar problems- states they have been going up and down all day. Pt states body has been jittery today and he had a CBG in the 50s today.

## 2024-07-30 ENCOUNTER — Other Ambulatory Visit: Payer: Self-pay

## 2024-07-30 LAB — CBC
HCT: 38.5 % — ABNORMAL LOW (ref 39.0–52.0)
Hemoglobin: 12.4 g/dL — ABNORMAL LOW (ref 13.0–17.0)
MCH: 30 pg (ref 26.0–34.0)
MCHC: 32.2 g/dL (ref 30.0–36.0)
MCV: 93.2 fL (ref 80.0–100.0)
Platelets: 224 10*3/uL (ref 150–400)
RBC: 4.13 MIL/uL — ABNORMAL LOW (ref 4.22–5.81)
RDW: 13.3 % (ref 11.5–15.5)
WBC: 10.4 10*3/uL (ref 4.0–10.5)
nRBC: 0 % (ref 0.0–0.2)

## 2024-07-30 LAB — URINALYSIS, ROUTINE W REFLEX MICROSCOPIC
Bilirubin Urine: NEGATIVE
Glucose, UA: NEGATIVE mg/dL
Hgb urine dipstick: NEGATIVE
Ketones, ur: NEGATIVE mg/dL
Leukocytes,Ua: NEGATIVE
Nitrite: NEGATIVE
Protein, ur: NEGATIVE mg/dL
Specific Gravity, Urine: 1.017 (ref 1.005–1.030)
pH: 6 (ref 5.0–8.0)

## 2024-07-30 LAB — COMPREHENSIVE METABOLIC PANEL WITH GFR
ALT: 28 U/L (ref 0–44)
AST: 31 U/L (ref 15–41)
Albumin: 4.1 g/dL (ref 3.5–5.0)
Alkaline Phosphatase: 76 U/L (ref 38–126)
Anion gap: 13 (ref 5–15)
BUN: 13 mg/dL (ref 6–20)
CO2: 24 mmol/L (ref 22–32)
Calcium: 9 mg/dL (ref 8.9–10.3)
Chloride: 106 mmol/L (ref 98–111)
Creatinine, Ser: 1.26 mg/dL — ABNORMAL HIGH (ref 0.61–1.24)
GFR, Estimated: >60 mL/min
Glucose, Bld: 98 mg/dL (ref 70–99)
Potassium: 3.9 mmol/L (ref 3.5–5.1)
Sodium: 143 mmol/L (ref 135–145)
Total Bilirubin: 0.2 mg/dL (ref 0.0–1.2)
Total Protein: 7.2 g/dL (ref 6.5–8.1)

## 2024-07-30 LAB — MAGNESIUM: Magnesium: 2.4 mg/dL (ref 1.7–2.4)

## 2024-07-30 NOTE — Discharge Instructions (Signed)
 You were evaluated in the Emergency Department and after careful evaluation, we did not find any emergent condition requiring admission or further testing in the hospital.  Your exam/testing today is overall reassuring.  Recommend changing the location of your glucose monitor and following up with your primary care doctor as we discussed.  Please return to the Emergency Department if you experience any worsening of your condition.   Thank you for allowing us  to be a part of your care.

## 2024-07-31 ENCOUNTER — Ambulatory Visit (INDEPENDENT_AMBULATORY_CARE_PROVIDER_SITE_OTHER): Payer: MEDICAID | Admitting: Student

## 2024-07-31 ENCOUNTER — Other Ambulatory Visit (HOSPITAL_COMMUNITY): Payer: Self-pay

## 2024-07-31 DIAGNOSIS — F411 Generalized anxiety disorder: Secondary | ICD-10-CM

## 2024-08-06 ENCOUNTER — Other Ambulatory Visit: Payer: Self-pay

## 2024-08-13 ENCOUNTER — Ambulatory Visit (HOSPITAL_COMMUNITY): Payer: MEDICAID | Admitting: Licensed Clinical Social Worker

## 2024-08-28 ENCOUNTER — Encounter: Payer: MEDICAID | Admitting: Dietician

## 2024-08-31 ENCOUNTER — Ambulatory Visit: Payer: MEDICAID | Admitting: Family Medicine

## 2024-10-16 ENCOUNTER — Encounter (HOSPITAL_COMMUNITY): Payer: MEDICAID | Admitting: Student

## 2024-11-02 ENCOUNTER — Other Ambulatory Visit: Payer: MEDICAID

## 2024-11-16 ENCOUNTER — Ambulatory Visit: Payer: Self-pay | Admitting: Family

## 2025-02-09 ENCOUNTER — Ambulatory Visit: Payer: MEDICAID | Admitting: Adult Health
